# Patient Record
Sex: Female | Born: 1945 | Race: White | Hispanic: No | Smoking: Never smoker
Health system: Southern US, Community
[De-identification: ages and names within clinical notes are randomized; demographics above are authoritative.]

## PROBLEM LIST (undated history)

## (undated) DIAGNOSIS — I2510 Atherosclerotic heart disease of native coronary artery without angina pectoris: Secondary | ICD-10-CM

## (undated) DIAGNOSIS — I471 Supraventricular tachycardia, unspecified: Secondary | ICD-10-CM

## (undated) DIAGNOSIS — IMO0002 Reserved for concepts with insufficient information to code with codable children: Secondary | ICD-10-CM

## (undated) DIAGNOSIS — E119 Type 2 diabetes mellitus without complications: Secondary | ICD-10-CM

## (undated) DIAGNOSIS — R609 Edema, unspecified: Secondary | ICD-10-CM

## (undated) DIAGNOSIS — F419 Anxiety disorder, unspecified: Secondary | ICD-10-CM

## (undated) DIAGNOSIS — I219 Acute myocardial infarction, unspecified: Secondary | ICD-10-CM

## (undated) DIAGNOSIS — Z9289 Personal history of other medical treatment: Secondary | ICD-10-CM

## (undated) DIAGNOSIS — E7800 Pure hypercholesterolemia, unspecified: Secondary | ICD-10-CM

## (undated) DIAGNOSIS — K626 Ulcer of anus and rectum: Secondary | ICD-10-CM

## (undated) DIAGNOSIS — M1990 Unspecified osteoarthritis, unspecified site: Secondary | ICD-10-CM

## (undated) DIAGNOSIS — I10 Essential (primary) hypertension: Secondary | ICD-10-CM

## (undated) HISTORY — PX: BREAST SURGERY: SHX581

## (undated) HISTORY — PX: CHOLECYSTECTOMY: SHX55

## (undated) HISTORY — PX: FRACTURE SURGERY: SHX138

## (undated) HISTORY — DX: Supraventricular tachycardia: I47.1

## (undated) HISTORY — PX: TUBAL LIGATION: SHX77

## (undated) HISTORY — DX: Supraventricular tachycardia, unspecified: I47.10

## (undated) HISTORY — PX: EYE SURGERY: SHX253

## (undated) HISTORY — DX: Ulcer of anus and rectum: K62.6

## (undated) HISTORY — DX: Atherosclerotic heart disease of native coronary artery without angina pectoris: I25.10

## (undated) HISTORY — PX: CATARACT EXTRACTION, BILATERAL: SHX1313

---

## 2001-09-01 ENCOUNTER — Inpatient Hospital Stay (HOSPITAL_COMMUNITY): Admission: EM | Admit: 2001-09-01 | Discharge: 2001-09-04 | Payer: Self-pay

## 2001-10-29 ENCOUNTER — Encounter: Payer: Self-pay | Admitting: *Deleted

## 2001-10-29 ENCOUNTER — Encounter: Admission: RE | Admit: 2001-10-29 | Discharge: 2001-10-29 | Payer: Self-pay | Admitting: *Deleted

## 2001-11-24 ENCOUNTER — Emergency Department (HOSPITAL_COMMUNITY): Admission: EM | Admit: 2001-11-24 | Discharge: 2001-11-24 | Payer: Self-pay | Admitting: *Deleted

## 2001-11-29 ENCOUNTER — Encounter: Admission: RE | Admit: 2001-11-29 | Discharge: 2001-11-29 | Payer: Self-pay | Admitting: Family Medicine

## 2001-11-29 ENCOUNTER — Encounter: Payer: Self-pay | Admitting: Family Medicine

## 2002-10-28 ENCOUNTER — Encounter: Payer: Self-pay | Admitting: Ophthalmology

## 2002-10-29 ENCOUNTER — Ambulatory Visit (HOSPITAL_COMMUNITY): Admission: RE | Admit: 2002-10-29 | Discharge: 2002-10-30 | Payer: Self-pay | Admitting: Ophthalmology

## 2003-12-23 ENCOUNTER — Ambulatory Visit (HOSPITAL_COMMUNITY): Admission: RE | Admit: 2003-12-23 | Discharge: 2003-12-23 | Payer: Self-pay | Admitting: Ophthalmology

## 2006-10-05 ENCOUNTER — Encounter (HOSPITAL_BASED_OUTPATIENT_CLINIC_OR_DEPARTMENT_OTHER): Admission: RE | Admit: 2006-10-05 | Discharge: 2006-12-27 | Payer: Self-pay | Admitting: Surgery

## 2011-02-07 ENCOUNTER — Emergency Department (HOSPITAL_COMMUNITY): Payer: Medicare Other

## 2011-02-07 ENCOUNTER — Emergency Department (HOSPITAL_COMMUNITY)
Admission: EM | Admit: 2011-02-07 | Discharge: 2011-02-08 | Disposition: A | Discharge status: Home / Self Care | Payer: Medicare Other | Attending: Emergency Medicine | Admitting: Emergency Medicine

## 2011-02-07 DIAGNOSIS — R197 Diarrhea, unspecified: Secondary | ICD-10-CM | POA: Insufficient documentation

## 2011-02-07 DIAGNOSIS — K5289 Other specified noninfective gastroenteritis and colitis: Secondary | ICD-10-CM | POA: Insufficient documentation

## 2011-02-07 DIAGNOSIS — R079 Chest pain, unspecified: Secondary | ICD-10-CM | POA: Insufficient documentation

## 2011-02-07 DIAGNOSIS — R55 Syncope and collapse: Secondary | ICD-10-CM | POA: Insufficient documentation

## 2011-02-07 DIAGNOSIS — I10 Essential (primary) hypertension: Secondary | ICD-10-CM | POA: Insufficient documentation

## 2011-02-07 DIAGNOSIS — N390 Urinary tract infection, site not specified: Secondary | ICD-10-CM | POA: Insufficient documentation

## 2011-02-07 DIAGNOSIS — E119 Type 2 diabetes mellitus without complications: Secondary | ICD-10-CM | POA: Insufficient documentation

## 2011-02-07 DIAGNOSIS — Z794 Long term (current) use of insulin: Secondary | ICD-10-CM | POA: Insufficient documentation

## 2011-02-07 LAB — GLUCOSE, CAPILLARY: Glucose-Capillary: 313 mg/dL — ABNORMAL HIGH (ref 70–99)

## 2011-02-08 LAB — DIFFERENTIAL
Basophils Absolute: 0 10*3/uL (ref 0.0–0.1)
Basophils Relative: 0 % (ref 0–1)
Eosinophils Relative: 0 % (ref 0–5)
Monocytes Absolute: 0.8 10*3/uL (ref 0.1–1.0)
Neutro Abs: 12.3 10*3/uL — ABNORMAL HIGH (ref 1.7–7.7)

## 2011-02-08 LAB — POCT CARDIAC MARKERS
CKMB, poc: 2.1 ng/mL (ref 1.0–8.0)
Myoglobin, poc: 171 ng/mL (ref 12–200)
Troponin i, poc: <0.05 ng/mL (ref 0.00–0.09)

## 2011-02-08 LAB — CBC
HCT: 43.5 % (ref 36.0–46.0)
Hemoglobin: 14.7 g/dL (ref 12.0–15.0)
MCH: 30.5 pg (ref 26.0–34.0)
MCHC: 33.8 g/dL (ref 30.0–36.0)
MCV: 90.2 fL (ref 78.0–100.0)
Platelets: 151 10*3/uL (ref 150–400)
RBC: 4.82 MIL/uL (ref 3.87–5.11)
RDW: 12.5 % (ref 11.5–15.5)
WBC: 13.6 10*3/uL — ABNORMAL HIGH (ref 4.0–10.5)

## 2011-02-08 LAB — URINALYSIS, ROUTINE W REFLEX MICROSCOPIC
Nitrite: POSITIVE — AB
Specific Gravity, Urine: 1.023 (ref 1.005–1.030)
Urobilinogen, UA: 0.2 mg/dL (ref 0.0–1.0)
pH: 6 (ref 5.0–8.0)

## 2011-02-08 LAB — URINE MICROSCOPIC-ADD ON

## 2011-02-08 LAB — BASIC METABOLIC PANEL
Calcium: 8.3 mg/dL — ABNORMAL LOW (ref 8.4–10.5)
GFR calc Af Amer: >60 mL/min (ref >60)
GFR calc non Af Amer: >60 mL/min (ref >60)
Potassium: 3.8 mEq/L (ref 3.5–5.1)
Sodium: 135 mEq/L (ref 135–145)

## 2011-02-08 LAB — PROTIME-INR
INR: 1.06 (ref 0.00–1.49)
Prothrombin Time: 14 seconds (ref 11.6–15.2)

## 2011-02-08 LAB — APTT: aPTT: 39 seconds — ABNORMAL HIGH (ref 24–37)

## 2011-02-10 LAB — URINE CULTURE: Colony Count: >=100000

## 2011-03-09 ENCOUNTER — Ambulatory Visit (HOSPITAL_COMMUNITY)
Admission: EM | Admit: 2011-03-09 | Discharge: 2011-03-09 | Disposition: A | Discharge status: Home / Self Care | Payer: Self-pay | Source: Ambulatory Visit | Attending: Emergency Medicine | Admitting: Emergency Medicine

## 2012-03-31 ENCOUNTER — Inpatient Hospital Stay (HOSPITAL_COMMUNITY): Payer: Medicare Other

## 2012-03-31 ENCOUNTER — Encounter (HOSPITAL_COMMUNITY): Payer: Self-pay | Admitting: *Deleted

## 2012-03-31 ENCOUNTER — Inpatient Hospital Stay (HOSPITAL_COMMUNITY)
Admission: EM | Admit: 2012-03-31 | Discharge: 2012-04-04 | DRG: 603 | Disposition: A | Discharge status: Home / Self Care | Payer: Medicare Other | Attending: Internal Medicine | Admitting: Internal Medicine

## 2012-03-31 ENCOUNTER — Emergency Department (HOSPITAL_COMMUNITY): Payer: Medicare Other

## 2012-03-31 DIAGNOSIS — E860 Dehydration: Secondary | ICD-10-CM

## 2012-03-31 DIAGNOSIS — I10 Essential (primary) hypertension: Secondary | ICD-10-CM | POA: Diagnosis present

## 2012-03-31 DIAGNOSIS — L97509 Non-pressure chronic ulcer of other part of unspecified foot with unspecified severity: Secondary | ICD-10-CM | POA: Diagnosis present

## 2012-03-31 DIAGNOSIS — E871 Hypo-osmolality and hyponatremia: Secondary | ICD-10-CM

## 2012-03-31 DIAGNOSIS — L02619 Cutaneous abscess of unspecified foot: Secondary | ICD-10-CM

## 2012-03-31 DIAGNOSIS — L03039 Cellulitis of unspecified toe: Secondary | ICD-10-CM

## 2012-03-31 DIAGNOSIS — IMO0002 Reserved for concepts with insufficient information to code with codable children: Secondary | ICD-10-CM | POA: Diagnosis present

## 2012-03-31 DIAGNOSIS — E119 Type 2 diabetes mellitus without complications: Secondary | ICD-10-CM

## 2012-03-31 DIAGNOSIS — E1165 Type 2 diabetes mellitus with hyperglycemia: Secondary | ICD-10-CM | POA: Diagnosis present

## 2012-03-31 DIAGNOSIS — L03119 Cellulitis of unspecified part of limb: Principal | ICD-10-CM | POA: Diagnosis present

## 2012-03-31 DIAGNOSIS — E785 Hyperlipidemia, unspecified: Secondary | ICD-10-CM | POA: Diagnosis present

## 2012-03-31 LAB — DIFFERENTIAL
Basophils Absolute: 0 10*3/uL (ref 0.0–0.1)
Basophils Relative: 0 % (ref 0–1)
Eosinophils Absolute: 0 10*3/uL (ref 0.0–0.7)
Eosinophils Relative: 0 % (ref 0–5)
Lymphocytes Relative: 10 % — ABNORMAL LOW (ref 12–46)
Lymphs Abs: 1.6 10*3/uL (ref 0.7–4.0)
Monocytes Absolute: 1.5 10*3/uL — ABNORMAL HIGH (ref 0.1–1.0)
Monocytes Relative: 10 % (ref 3–12)
Neutro Abs: 12.4 10*3/uL — ABNORMAL HIGH (ref 1.7–7.7)
Neutrophils Relative %: 80 % — ABNORMAL HIGH (ref 43–77)

## 2012-03-31 LAB — COMPREHENSIVE METABOLIC PANEL
ALT: 12 U/L (ref 0–35)
AST: 12 U/L (ref 0–37)
Albumin: 2.7 g/dL — ABNORMAL LOW (ref 3.5–5.2)
Alkaline Phosphatase: 72 U/L (ref 39–117)
BUN: 17 mg/dL (ref 6–23)
CO2: 22 mEq/L (ref 19–32)
Calcium: 9 mg/dL (ref 8.4–10.5)
Chloride: 89 mEq/L — ABNORMAL LOW (ref 96–112)
Creatinine, Ser: 0.62 mg/dL (ref 0.50–1.10)
GFR calc Af Amer: >90 mL/min (ref >90)
GFR calc non Af Amer: >90 mL/min (ref >90)
Glucose, Bld: 367 mg/dL — ABNORMAL HIGH (ref 70–99)
Potassium: 3.6 mEq/L (ref 3.5–5.1)
Sodium: 129 mEq/L — ABNORMAL LOW (ref 135–145)
Total Bilirubin: 0.4 mg/dL (ref 0.3–1.2)
Total Protein: 7.2 g/dL (ref 6.0–8.3)

## 2012-03-31 LAB — GLUCOSE, CAPILLARY: Glucose-Capillary: 362 mg/dL — ABNORMAL HIGH (ref 70–99)

## 2012-03-31 LAB — CBC
HCT: 37.2 % (ref 36.0–46.0)
Hemoglobin: 13.1 g/dL (ref 12.0–15.0)
MCH: 30.5 pg (ref 26.0–34.0)
MCHC: 35.2 g/dL (ref 30.0–36.0)
MCV: 86.7 fL (ref 78.0–100.0)
Platelets: 197 10*3/uL (ref 150–400)
RBC: 4.29 MIL/uL (ref 3.87–5.11)
RDW: 12.4 % (ref 11.5–15.5)
WBC: 15.6 10*3/uL — ABNORMAL HIGH (ref 4.0–10.5)

## 2012-03-31 LAB — LACTIC ACID, PLASMA: Lactic Acid, Venous: 1.6 mmol/L (ref 0.5–2.2)

## 2012-03-31 LAB — SEDIMENTATION RATE: Sed Rate: 110 mm/hr — ABNORMAL HIGH (ref 0–22)

## 2012-03-31 LAB — C-REACTIVE PROTEIN: CRP: 32.23 mg/dL — ABNORMAL HIGH (ref <0.60)

## 2012-03-31 MED ORDER — PIPERACILLIN-TAZOBACTAM 3.375 G IVPB
3.3750 g | Freq: Three times a day (TID) | INTRAVENOUS | Status: DC
  Administered 2012-03-31 – 2012-04-04 (×11): 3.375 g via INTRAVENOUS
  Filled 2012-03-31 (×14): qty 50

## 2012-03-31 MED ORDER — CLINDAMYCIN PHOSPHATE 900 MG/50ML IV SOLN
900.0000 mg | INTRAVENOUS | Status: AC
  Administered 2012-03-31: 900 mg via INTRAVENOUS
  Filled 2012-03-31: qty 50

## 2012-03-31 MED ORDER — ACETAMINOPHEN 325 MG PO TABS
650.0000 mg | ORAL_TABLET | Freq: Four times a day (QID) | ORAL | Status: DC | PRN
  Administered 2012-04-03: 650 mg via ORAL
  Filled 2012-03-31: qty 2

## 2012-03-31 MED ORDER — TETANUS-DIPHTH-ACELL PERTUSSIS 5-2.5-18.5 LF-MCG/0.5 IM SUSP
0.5000 mL | Freq: Once | INTRAMUSCULAR | Status: AC
  Administered 2012-03-31: 0.5 mL via INTRAMUSCULAR
  Filled 2012-03-31: qty 0.5

## 2012-03-31 MED ORDER — FUROSEMIDE 40 MG PO TABS
40.0000 mg | ORAL_TABLET | Freq: Every day | ORAL | Status: DC
  Administered 2012-03-31 – 2012-04-04 (×5): 40 mg via ORAL
  Filled 2012-03-31 (×5): qty 1

## 2012-03-31 MED ORDER — INSULIN GLARGINE 100 UNIT/ML ~~LOC~~ SOLN
20.0000 [IU] | Freq: Once | SUBCUTANEOUS | Status: AC
  Administered 2012-03-31: 20 [IU] via SUBCUTANEOUS

## 2012-03-31 MED ORDER — INSULIN ASPART 100 UNIT/ML ~~LOC~~ SOLN
0.0000 [IU] | Freq: Three times a day (TID) | SUBCUTANEOUS | Status: DC
  Administered 2012-04-01: 7 [IU] via SUBCUTANEOUS
  Administered 2012-04-01 – 2012-04-02 (×3): 5 [IU] via SUBCUTANEOUS
  Administered 2012-04-02 – 2012-04-03 (×3): 3 [IU] via SUBCUTANEOUS
  Administered 2012-04-03: 2 [IU] via SUBCUTANEOUS

## 2012-03-31 MED ORDER — ENOXAPARIN SODIUM 40 MG/0.4ML ~~LOC~~ SOLN
40.0000 mg | SUBCUTANEOUS | Status: DC
  Administered 2012-03-31 – 2012-04-04 (×4): 40 mg via SUBCUTANEOUS
  Filled 2012-03-31 (×5): qty 0.4

## 2012-03-31 MED ORDER — INSULIN GLARGINE 100 UNIT/ML ~~LOC~~ SOLN
SUBCUTANEOUS | Status: AC
  Administered 2012-03-31: 20 [IU] via SUBCUTANEOUS
  Filled 2012-03-31: qty 20

## 2012-03-31 MED ORDER — SODIUM CHLORIDE 0.9 % IV SOLN
INTRAVENOUS | Status: DC
  Administered 2012-03-31 – 2012-04-03 (×6): via INTRAVENOUS

## 2012-03-31 MED ORDER — ONDANSETRON HCL 4 MG PO TABS: 4.0000 mg | ORAL_TABLET | Freq: Four times a day (QID) | ORAL | Status: DC | PRN

## 2012-03-31 MED ORDER — VITAMIN C 500 MG PO TABS
500.0000 mg | ORAL_TABLET | Freq: Every day | ORAL | Status: DC
  Administered 2012-04-01 – 2012-04-04 (×4): 500 mg via ORAL
  Filled 2012-03-31 (×4): qty 1

## 2012-03-31 MED ORDER — ACETAMINOPHEN 650 MG RE SUPP: 650.0000 mg | Freq: Four times a day (QID) | RECTAL | Status: DC | PRN

## 2012-03-31 MED ORDER — SODIUM CHLORIDE 0.9 % IV BOLUS (SEPSIS)
500.0000 mL | Freq: Once | INTRAVENOUS | Status: AC
  Administered 2012-03-31: 500 mL via INTRAVENOUS

## 2012-03-31 MED ORDER — MORPHINE SULFATE 2 MG/ML IJ SOLN: 2.0000 mg | INTRAMUSCULAR | Status: DC | PRN

## 2012-03-31 MED ORDER — INSULIN ASPART 100 UNIT/ML ~~LOC~~ SOLN
20.0000 [IU] | Freq: Once | SUBCUTANEOUS | Status: AC
  Administered 2012-03-31: 20 [IU] via SUBCUTANEOUS

## 2012-03-31 MED ORDER — ONDANSETRON HCL 4 MG/2ML IJ SOLN: 4.0000 mg | Freq: Four times a day (QID) | INTRAMUSCULAR | Status: DC | PRN

## 2012-03-31 MED ORDER — INSULIN GLARGINE 100 UNIT/ML ~~LOC~~ SOLN
34.0000 [IU] | Freq: Two times a day (BID) | SUBCUTANEOUS | Status: DC
  Administered 2012-04-01 – 2012-04-02 (×3): 34 [IU] via SUBCUTANEOUS

## 2012-03-31 MED ORDER — INSULIN ASPART 100 UNIT/ML ~~LOC~~ SOLN
SUBCUTANEOUS | Status: AC
  Administered 2012-03-31: 20 [IU] via SUBCUTANEOUS
  Filled 2012-03-31: qty 1

## 2012-03-31 MED ORDER — RAMIPRIL 10 MG PO CAPS
10.0000 mg | ORAL_CAPSULE | Freq: Two times a day (BID) | ORAL | Status: DC
  Administered 2012-03-31 – 2012-04-04 (×8): 10 mg via ORAL
  Filled 2012-03-31 (×9): qty 1

## 2012-03-31 MED ORDER — VANCOMYCIN HCL IN DEXTROSE 1-5 GM/200ML-% IV SOLN
1000.0000 mg | Freq: Two times a day (BID) | INTRAVENOUS | Status: DC
  Administered 2012-03-31 – 2012-04-04 (×8): 1000 mg via INTRAVENOUS
  Filled 2012-03-31 (×9): qty 200

## 2012-03-31 MED ORDER — SIMVASTATIN 40 MG PO TABS
40.0000 mg | ORAL_TABLET | Freq: Every day | ORAL | Status: DC
  Administered 2012-03-31 – 2012-04-03 (×4): 40 mg via ORAL
  Filled 2012-03-31 (×5): qty 1

## 2012-03-31 MED ORDER — ADULT MULTIVITAMIN W/MINERALS CH
1.0000 | ORAL_TABLET | Freq: Every day | ORAL | Status: DC
  Administered 2012-04-01 – 2012-04-04 (×4): 1 via ORAL
  Filled 2012-03-31 (×4): qty 1

## 2012-03-31 NOTE — ED Notes (Signed)
Patient denies pain and is resting comfortably.  

## 2012-03-31 NOTE — ED Notes (Signed)
To ED for eval of right foot pain and redness for the past week. Unknown injury. Pt is diabetic. 3-4 plus pitting edema and foot is red

## 2012-03-31 NOTE — ED Notes (Signed)
Admitting MD at bedside, pt awaiting inpt beds assignment.  

## 2012-03-31 NOTE — ED Provider Notes (Signed)
History     CSN: [id number]  Arrival date & time 03/31/12  1427   First MD Initiated Contact with Patient 03/31/12 1506      Chief Complaint  Patient presents with  . Foot Swelling    (Consider location/radiation/quality/duration/timing/severity/associated sxs/prior treatment) Patient is a 66 y.o. female presenting with rash. The history is provided by the patient.  Rash  This is a new problem. The current episode started 2 days ago. The problem has been gradually worsening. The problem is associated with an unknown (No known injury, but does walk barefoot frequently) factor. There has been no fever. The rash is present on the right foot. The pain is moderate. The pain has been constant since onset. Associated symptoms include blisters and pain. She has tried nothing for the symptoms. Risk factors: DM.    Past Medical History  Diagnosis Date  . Diabetes mellitus     History reviewed. No pertinent past surgical history.  History reviewed. No pertinent family history.  History  Substance Use Topics  . Smoking status: Not on file  . Smokeless tobacco: Not on file  . Alcohol Use: No    OB History    Grav Para Term Preterm Abortions TAB SAB Ect Mult Living                  Review of Systems  Constitutional: Negative for fever, chills and fatigue.  Respiratory: Negative for cough and shortness of breath.   Cardiovascular: Negative for chest pain.  Gastrointestinal: Positive for nausea. Negative for vomiting, abdominal pain and diarrhea.  Genitourinary: Negative for dysuria.  Skin: Positive for rash.  Neurological: Negative for headaches.  All other systems reviewed and are negative.    Allergies  Review of patient's allergies indicates no known allergies.  Home Medications   Current Outpatient Rx  Name Route Sig Dispense Refill  . VITAMIN C PO Oral Take 1 tablet by mouth daily.    . FUROSEMIDE 40 MG PO TABS Oral Take 40 mg by mouth daily.    . INSULIN GLARGINE  100 UNIT/ML [place] SOLN Subcutaneous Inject 34 Units into the skin 2 (two) times daily.    . ADULT MULTIVITAMIN W/MINERALS CH Oral Take 1 tablet by mouth daily.    Marland Kitchen SALMON PO Oral Take 1 capsule by mouth daily.    Marland Kitchen RAMIPRIL 10 MG PO CAPS Oral Take 10 mg by mouth 2 (two) times daily.    Marland Kitchen SIMVASTATIN 40 MG PO TABS Oral Take 40 mg by mouth at bedtime.      BP 144/77  Pulse 110  Temp(Src) 99.2 F (37.3 C) (Oral)  Resp 22  SpO2 95%  Physical Exam  Nursing note and vitals reviewed. Constitutional: She is oriented to person, place, and time. She appears well-developed and well-nourished.  HENT:  Head: Normocephalic and atraumatic.  Eyes: EOM are normal.  Neck: Normal range of motion.  Cardiovascular: Normal rate, regular rhythm and normal heart sounds.   Pulmonary/Chest: Effort normal and breath sounds normal. No respiratory distress.  Abdominal: Soft. There is no tenderness.  Musculoskeletal: Normal range of motion.       Right foot: She exhibits tenderness and swelling.  Neurological: She is alert and oriented to person, place, and time.  Skin: Skin is warm and dry. There is erythema (R foot).  Psychiatric: She has a normal mood and affect.    ED Course  Procedures (including critical care time)  Labs Reviewed  CBC - Abnormal; Notable for the  following:    WBC 15.6 (*)    All other components within normal limits  DIFFERENTIAL - Abnormal; Notable for the following:    Neutrophils Relative 80 (*)    Neutro Abs 12.4 (*)    Lymphocytes Relative 10 (*)    Monocytes Absolute 1.5 (*)    All other components within normal limits  COMPREHENSIVE METABOLIC PANEL - Abnormal; Notable for the following:    Sodium 129 (*)    Chloride 89 (*)    Glucose, Bld 367 (*)    Albumin 2.7 (*)    All other components within normal limits  SEDIMENTATION RATE - Abnormal; Notable for the following:    Sed Rate 110 (*)    All other components within normal limits  GLUCOSE, CAPILLARY - Abnormal;  Notable for the following:    Glucose-Capillary 362 (*)    All other components within normal limits  LACTIC ACID, PLASMA  C-REACTIVE PROTEIN  CULTURE, BLOOD (ROUTINE X 2)  CULTURE, BLOOD (ROUTINE X 2)   Dg Foot Complete Right  03/31/2012  *RADIOLOGY REPORT*  Clinical Data: Stepped on something earlier this week, now with swelling, pain, and rash.  History of diabetes.  RIGHT FOOT COMPLETE - 3+ VIEW 03/31/2012:  Comparison: None.  Findings: Soft tissue swelling along the plantar surface of the foot overlying the head of the metatarsals.  No opaque foreign body in the soft tissues.  Moderate hallux valgus.  Osteopenia.  No evidence of acute or subacute fracture or dislocation.  Joint space narrowing involving the IP joints of multiple toes.  Remaining joint spaces well preserved.  Small plantar calcaneal spur.  IMPRESSION: No opaque foreign body in the soft tissues.  No acute osseous abnormality.  Osteopenia.  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  Original Report Authenticated By: Arnell Sieving, M.D.     1. Cellulitis of foot   2. Hyponatremia       MDM   Pt with h/o DM presents with R foot pain, swelling, redness.  Exam with same. Warmth throughout foot and findings c/w cellulitis. On sole of foot there is smaller area of hemorrhagic necrosis. No crepitus appreciated. Also area of skin thickening/whitening.  No area of fluctuance indicating focal fluid collection.   Labs witih noted leukocytosis and elevated ESR. Also low Na and Cl, most c/w dehydration. XR without SQ gas or osseous changes.  Hospitalist will admit.      Donnamarie Poag, MD 03/31/12 2214

## 2012-03-31 NOTE — ED Notes (Signed)
Spoke with admitting MD Garba with reports of pt blood sugar level due to orders to call MD for blood sugar level greater than 400. MD Mikeal Hawthorne given verbal order to give pt 20 units of Lantus and 20 units of Novolog subcutaneous.

## 2012-03-31 NOTE — H&P (Signed)
[name] is an 66 y.o. female.   Chief Complaint: Right leg swelling HPI: A 66 year old female with history of diabetes hyperlipidemia who has been doing fine until about a week ago when she noticed a small swelling on the bottom of her foot. These gradually progressed to swelling of whole right leg especially the foot. It has changed color to be red, persistent leg swelling mildly tender. Patient has long-standing diabetes and has some level of neuropathy. She uses diabetic shoes but apparently she's been walking barefooted around the house. She did not remember any injury. She had no prior history of cellulitis. She denied fever or chills but is noted to have low-grade fever in the ER. She has so far her blood cultures and initial antibiotics in the ER. Her sugar has been relatively controlled with insulin but it is noted that she had a blood sugar of about 400 now. Patient didn't see some discharge from the bottom of her foot but that has stopped. Her pain right now is at 2/10.  Past Medical History  Diagnosis Date  . Diabetes mellitus     History reviewed. No pertinent past surgical history.  History reviewed. No pertinent family history. Social History:  does not have a smoking history on file. She does not have any smokeless tobacco history on file. She reports that she does not drink alcohol or use illicit drugs.  Allergies: No Known Allergies   (Not in a hospital admission)  Results for orders placed during the hospital encounter of 03/31/12 (from the past 48 hour(s))  CBC     Status: Abnormal   Collection Time   03/31/12  3:37 PM      Component Value Range Comment   WBC 15.6 (*) 4.0 - 10.5 (K/uL)    RBC 4.29  3.87 - 5.11 (MIL/uL)    Hemoglobin 13.1  12.0 - 15.0 (g/dL)    HCT 40.9  81.1 - 91.4 (%)    MCV 86.7  78.0 - 100.0 (fL)    MCH 30.5  26.0 - 34.0 (pg)    MCHC 35.2  30.0 - 36.0 (g/dL)    RDW 78.2  95.6 - 21.3 (%)    Platelets 197  150 - 400 (K/uL)   DIFFERENTIAL      Status: Abnormal   Collection Time   03/31/12  3:37 PM      Component Value Range Comment   Neutrophils Relative 80 (*) 43 - 77 (%)    Neutro Abs 12.4 (*) 1.7 - 7.7 (K/uL)    Lymphocytes Relative 10 (*) 12 - 46 (%)    Lymphs Abs 1.6  0.7 - 4.0 (K/uL)    Monocytes Relative 10  3 - 12 (%)    Monocytes Absolute 1.5 (*) 0.1 - 1.0 (K/uL)    Eosinophils Relative 0  0 - 5 (%)    Eosinophils Absolute 0.0  0.0 - 0.7 (K/uL)    Basophils Relative 0  0 - 1 (%)    Basophils Absolute 0.0  0.0 - 0.1 (K/uL)   COMPREHENSIVE METABOLIC PANEL     Status: Abnormal   Collection Time   03/31/12  3:37 PM      Component Value Range Comment   Sodium 129 (*) 135 - 145 (mEq/L)    Potassium 3.6  3.5 - 5.1 (mEq/L)    Chloride 89 (*) 96 - 112 (mEq/L)    CO2 22  19 - 32 (mEq/L)    Glucose, Bld 367 (*) 70 -  99 (mg/dL)    BUN 17  6 - 23 (mg/dL)    Creatinine, Ser 1.61  0.50 - 1.10 (mg/dL)    Calcium 9.0  8.4 - 10.5 (mg/dL)    Total Protein 7.2  6.0 - 8.3 (g/dL)    Albumin 2.7 (*) 3.5 - 5.2 (g/dL)    AST 12  0 - 37 (U/L)    ALT 12  0 - 35 (U/L)    Alkaline Phosphatase 72  39 - 117 (U/L)    Total Bilirubin 0.4  0.3 - 1.2 (mg/dL)    GFR calc non Af Amer >90  >90 (mL/min)    GFR calc Af Amer >90  >90 (mL/min)   LACTIC ACID, PLASMA     Status: Normal   Collection Time   03/31/12  3:37 PM      Component Value Range Comment   Lactic Acid, Venous 1.6  0.5 - 2.2 (mmol/L)   SEDIMENTATION RATE     Status: Abnormal   Collection Time   03/31/12  3:37 PM      Component Value Range Comment   Sed Rate 110 (*) 0 - 22 (mm/hr)   C-REACTIVE PROTEIN     Status: Abnormal   Collection Time   03/31/12  3:37 PM      Component Value Range Comment   CRP 32.23 (*) <0.60 (mg/dL)   GLUCOSE, CAPILLARY     Status: Abnormal   Collection Time   03/31/12  4:06 PM      Component Value Range Comment   Glucose-Capillary 362 (*) 70 - 99 (mg/dL)   GLUCOSE, CAPILLARY     Status: Abnormal   Collection Time   03/31/12  7:39 PM      Component Value  Range Comment   Glucose-Capillary 444 (*) 70 - 99 (mg/dL)    Dg Foot Complete Right  03/31/2012  *RADIOLOGY REPORT*  Clinical Data: Stepped on something earlier this week, now with swelling, pain, and rash.  History of diabetes.  RIGHT FOOT COMPLETE - 3+ VIEW 03/31/2012:  Comparison: None.  Findings: Soft tissue swelling along the plantar surface of the foot overlying the head of the metatarsals.  No opaque foreign body in the soft tissues.  Moderate hallux valgus.  Osteopenia.  No evidence of acute or subacute fracture or dislocation.  Joint space narrowing involving the IP joints of multiple toes.  Remaining joint spaces well preserved.  Small plantar calcaneal spur.  IMPRESSION: No opaque foreign body in the soft tissues.  No acute osseous abnormality.  Osteopenia.  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  Original Report Authenticated By: Arnell Sieving, M.D.    Review of Systems  Constitutional: Positive for fever. Negative for chills.  HENT: Negative.   Eyes: Negative.   Respiratory: Negative.   Cardiovascular: Negative.   Gastrointestinal: Negative.   Musculoskeletal: Negative.   Skin: Positive for rash.  Neurological: Negative.   Psychiatric/Behavioral: Negative.     Blood pressure 145/89, pulse 118, temperature 99 F (37.2 C), temperature source Oral, resp. rate 20, SpO2 96.00%. Physical Exam  Constitutional: She is oriented to person, place, and time. She appears well-developed and well-nourished.  HENT:  Head: Normocephalic and atraumatic.  Right Ear: External ear normal.  Left Ear: External ear normal.  Nose: Nose normal.  Mouth/Throat: Oropharynx is clear and moist.  Eyes: Conjunctivae and EOM are normal. Pupils are equal, round, and reactive to light.  Neck: Normal range of motion. Neck supple.  Cardiovascular: Normal rate,  regular rhythm, normal heart sounds and intact distal pulses.   Respiratory: Effort normal and breath sounds normal.  GI: Soft.  Bowel sounds are normal.  Musculoskeletal: She exhibits edema.       Feet:  Neurological: She is alert and oriented to person, place, and time. She has normal reflexes.  Skin: Skin is warm. There is erythema.  Psychiatric: She has a normal mood and affect. Her behavior is normal. Judgment and thought content normal.     Assessment/Plan Assessment this is a 66 year old female presenting with diabetic foot ulcer with  Cellulitis. There is a possibility of abscess also. Most likely she had an injury from walking around barefooted weeks caused the infection. Plan #1 cellulitis with ulcer: Patient will be admitted, started on IV antibiotics, I will check a CT of the foot to rule out any abscess,  Pursue blood cultures, obtain wound cultures, get wound care and consider Orthopedics consult. Plan #2 diabetes: This seems uncontrolled. We'll give her some sliding scale insulin with her home Lantus dose and titrate as . Will check hemoglobin A1c to see how well controlled her sugar has been. #3 hyponatremia: Probably from hypoglycemia. We'll give high-dose saline and follow her sodium level. #4 dehydration: Again will hydrate her with saline #5 hyperlipidemia: Continue with statins #6 hypertension: Continue her home medications and titrate as necessary.  Mysti Haley,LAWAL 03/31/2012, 8:21 PM

## 2012-03-31 NOTE — Consult Note (Signed)
ANTIBIOTIC CONSULT NOTE - INITIAL  Pharmacy Consult for Vancomycin + Zosyn Indication: right foot cellulitis w/ ulcer  No Known Allergies  Patient Measurements: Height: 5' 8.5" (174 cm) Weight: 198 lb (89.812 kg) IBW/kg (Calculated) : 65.05   Vital Signs: Temp: 98.4 F (36.9 C) (05/04 2037) Temp src: Oral (05/04 2037) BP: 149/79 mmHg (05/04 2037) Pulse Rate: 98  (05/04 2037)  Labs:  Basename 03/31/12 1537  WBC 15.6*  HGB 13.1  PLT 197  LABCREA --  CREATININE 0.62   Estimated Creatinine Clearance: 80.8 ml/min (by C-G formula based on Cr of 0.62).  Microbiology: No results found for this or any previous visit (from the past 720 hour(s)).  Medical History: Past Medical History  Diagnosis Date  . Diabetes mellitus    Assessment: 65yof to begin empiric antibiotics for right diabetic foot ulcer with cellulitis. Weight=90kg and CrCl 34ml/min. Received one dose of clindamycin in the ED.  Goal of Therapy:  Vancomycin trough 10-15  Plan:  1) Vancomycin 1g q12 2) Zosyn 3.375g q8 - 4h infusion 3) Follow renal function, cultures, clinical progress  Fredrik Rigger 03/31/2012,8:41 PM

## 2012-03-31 NOTE — ED Notes (Signed)
X-ray to come over and pick up pt.

## 2012-04-01 DIAGNOSIS — E119 Type 2 diabetes mellitus without complications: Secondary | ICD-10-CM

## 2012-04-01 DIAGNOSIS — E871 Hypo-osmolality and hyponatremia: Secondary | ICD-10-CM

## 2012-04-01 DIAGNOSIS — I10 Essential (primary) hypertension: Secondary | ICD-10-CM

## 2012-04-01 DIAGNOSIS — L02619 Cutaneous abscess of unspecified foot: Secondary | ICD-10-CM

## 2012-04-01 DIAGNOSIS — L03039 Cellulitis of unspecified toe: Secondary | ICD-10-CM

## 2012-04-01 LAB — BASIC METABOLIC PANEL
CO2: 21 mEq/L (ref 19–32)
Calcium: 8.9 mg/dL (ref 8.4–10.5)
Chloride: 92 mEq/L — ABNORMAL LOW (ref 96–112)
Glucose, Bld: 240 mg/dL — ABNORMAL HIGH (ref 70–99)
Sodium: 128 mEq/L — ABNORMAL LOW (ref 135–145)

## 2012-04-01 LAB — CBC
HCT: 35.1 % — ABNORMAL LOW (ref 36.0–46.0)
MCH: 30.3 pg (ref 26.0–34.0)
MCV: 85.8 fL (ref 78.0–100.0)
Platelets: 223 10*3/uL (ref 150–400)
RBC: 4.09 MIL/uL (ref 3.87–5.11)

## 2012-04-01 LAB — GLUCOSE, CAPILLARY
Glucose-Capillary: 275 mg/dL — ABNORMAL HIGH (ref 70–99)
Glucose-Capillary: 299 mg/dL — ABNORMAL HIGH (ref 70–99)
Glucose-Capillary: 295 mg/dL — ABNORMAL HIGH (ref 70–99)

## 2012-04-01 MED ORDER — TRAZODONE 25 MG HALF TABLET
25.0000 mg | ORAL_TABLET | Freq: Every evening | ORAL | Status: DC | PRN
  Administered 2012-04-01 – 2012-04-02 (×2): 25 mg via ORAL
  Filled 2012-04-01 (×2): qty 1

## 2012-04-01 MED ORDER — IOHEXOL 350 MG/ML SOLN
80.0000 mL | Freq: Once | INTRAVENOUS | Status: AC | PRN
  Administered 2012-04-01: 80 mL via INTRAVENOUS

## 2012-04-01 NOTE — Progress Notes (Signed)
DAILY PROGRESS NOTE                              [facility]  SUBJECTIVE: Patient denies fever, denies chills. No complaints  OBJECTIVE: BP 132/75  Pulse 88  Temp(Src) 98.1 F (36.7 C) (Oral)  Resp 17  Ht 5' 9.6" (1.768 m)  Wt 85.8 kg (189 lb 2.5 oz)  BMI 27.45 kg/m2  SpO2 96%  Intake/Output Summary (Last 24 hours) at 04/01/12 1054 Last data filed at 04/01/12 1000  Gross per 24 hour  Intake 1456.25 ml  Output   1876 ml  Net -419.75 ml                      Weight change:  Physical Exam: General: Alert and awake oriented x3 not in any acute distress. HEENT: anicteric sclera, pupils equal reactive to light and accommodation CVS: S1-S2 heard, no murmur rubs or gallops Chest: clear to auscultation bilaterally, no wheezing rales or rhonchi Abdomen:  normal bowel sounds, soft, nontender, nondistended, no organomegaly Neuro: Cranial nerves II-XII intact, no focal neurological deficits Extremities: no cyanosis, no clubbing or edema noted bilaterally   Lab Results:  Basename 04/01/12 0035 03/31/12 1537  NA 128* 129*  K 3.1* 3.6  CL 92* 89*  CO2 21 22  GLUCOSE 240* 367*  BUN 16 17  CREATININE 0.58 0.62  CALCIUM 8.9 9.0  MG -- --  PHOS -- --    Basename 03/31/12 1537  AST 12  ALT 12  ALKPHOS 72  BILITOT 0.4  PROT 7.2  ALBUMIN 2.7*   No results found for this basename: LIPASE:2,AMYLASE:2 in the last 72 hours  Basename 04/01/12 0035 03/31/12 1537  WBC 14.1* 15.6*  NEUTROABS -- 12.4*  HGB 12.4 13.1  HCT 35.1* 37.2  MCV 85.8 86.7  PLT 223 197   No results found for this basename: CKTOTAL:3,CKMB:3,CKMBINDEX:3,TROPONINI:3 in the last 72 hours No components found with this basename: POCBNP:3 No results found for this basename: DDIMER:2 in the last 72 hours  Basename 03/31/12 1933  HGBA1C 13.5*   No results found for this basename: CHOL:2,HDL:2,LDLCALC:2,TRIG:2,CHOLHDL:2,LDLDIRECT:2 in the last 72 hours No results found for this  basename: TSH,T4TOTAL,FREET3,T3FREE,THYROIDAB in the last 72 hours No results found for this basename: VITAMINB12:2,FOLATE:2,FERRITIN:2,TIBC:2,IRON:2,RETICCTPCT:2 in the last 72 hours  Micro Results: No results found for this or any previous visit (from the past 240 hour(s)).  Studies/Results: Ct Foot Right W Contrast  04/01/2012  *RADIOLOGY REPORT*  Clinical Data: Diffuse right foot swelling and erythema, with a small sore at the plantar forefoot, near the big toe.  Assess for abscess.  History of diabetes.  CT OF THE RIGHT FOOT WITH CONTRAST  Technique:  Multidetector CT imaging was performed following the standard protocol during bolus administration of intravenous contrast.  Contrast: 80mL OMNIPAQUE IOHEXOL 350 MG/ML SOLN  Comparison: Right foot radiographs performed earlier today at 06:38 p.m.  Findings: Along the plantar aspect of the forefoot overlying the great toe, there is focal soft tissue irregularity and inflammation, without evidence of a focal fluid collection to suggest abscess.  Additional soft tissue edema is seen tracking about the forefoot, and along the dorsum of the midfoot; mild soft tissue irregularity along the dorsum of the midfoot appears chronic in nature.  There is mild soft tissue edema tracking about the lateral aspect of the ankle.  No definite osseous erosions are seen to suggest osteomyelitis. However,  evaluation for osteomyelitis is limited on CT.  Visualized joint spaces are preserved.  No significant degenerative change is appreciated.  A few tiny osseous fragments adjacent to the navicular are likely degenerative in nature.  The ankle mortise appears intact.  The interosseous space is within normal limits.  The subtalar joint is unremarkable in appearance. No significant soft tissue edema is seen along the sinus tarsi.  The flexor and extensor tendons are grossly unremarkable in appearance, aside from overlying soft tissue edema.  The peroneus tendons are unremarkable.   The Achilles tendon remains intact.  No acute vascular abnormalities are identified.  IMPRESSION:  1.  Focal soft tissue irregularity and inflammation along the plantar aspect of the forefoot, overlying the great toe. Additional soft tissue edema tracks about the forefoot, the dorsum of the midfoot, and lateral aspect of the ankle.  No evidence for abscess. 2.  No definite osseous erosions seen to suggest osteomyelitis, though evaluation for osteomyelitis is limited on CT.  No acute osseous abnormalities identified.  Original Report Authenticated By: Tonia Ghent, M.D.   Dg Foot Complete Right  03/31/2012  *RADIOLOGY REPORT*  Clinical Data: Stepped on something earlier this week, now with swelling, pain, and rash.  History of diabetes.  RIGHT FOOT COMPLETE - 3+ VIEW 03/31/2012:  Comparison: None.  Findings: Soft tissue swelling along the plantar surface of the foot overlying the head of the metatarsals.  No opaque foreign body in the soft tissues.  Moderate hallux valgus.  Osteopenia.  No evidence of acute or subacute fracture or dislocation.  Joint space narrowing involving the IP joints of multiple toes.  Remaining joint spaces well preserved.  Small plantar calcaneal spur.  IMPRESSION: No opaque foreign body in the soft tissues.  No acute osseous abnormality.  Osteopenia.  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  Original Report Authenticated By: Arnell Sieving, M.D.   Medications: Scheduled Meds:   . clindamycin (CLEOCIN) IV  900 mg Intravenous To Minor  . enoxaparin  40 mg Subcutaneous Q24H  . furosemide  40 mg Oral Daily  . insulin aspart  0-9 Units Subcutaneous TID WC  . insulin aspart  20 Units Subcutaneous Once  . insulin glargine  20 Units Subcutaneous Once  . insulin glargine  34 Units Subcutaneous BID  . mulitivitamin with minerals  1 tablet Oral Daily  . piperacillin-tazobactam (ZOSYN)  IV  3.375 g Intravenous Q8H  . ramipril  10 mg Oral BID  . simvastatin  40 mg  Oral QHS  . sodium chloride  500 mL Intravenous Once  . TDaP  0.5 mL Intramuscular Once  . vancomycin  1,000 mg Intravenous Q12H  . vitamin C  500 mg Oral Daily   Continuous Infusions:   . sodium chloride 125 mL/hr at 04/01/12 0503   PRN Meds:.acetaminophen, acetaminophen, iohexol, morphine injection, ondansetron (ZOFRAN) IV, ondansetron  ASSESSMENT & PLAN: Principal Problem:  *Cellulitis and abscess of foot Active Problems:  Dehydration  Hyponatremia  Diabetes mellitus   Right foot cellulitis -Diabetic foot ulcer, we'll obtain wound ostomy care team to see her. -CT scan of right foot showed no bony involvement, no evidence of abscess. -Patient started on broad-spectrum antibiotics including vancomycin and Zosyn.  Diabetes mellitus type 2 -Uncontrolled, hemoglobin A1c is 13.5 which correlate to blood glucose of 340. -Patient is on Lantus insulin, the dose will be adjusted while patient in the hospital here.  Hyponatremia -Corrected sodium level is about 131, I think dehydration also as playing a  role. -Patient is on normal saline we'll continue and follow the sodium level closely.  Dehydration -Hydrate aggressively with IV fluids, patient is on normal saline. -Continued to follow the renal function closely with BMP in the morning.  Hypertension -Continue preadmission home medications.   LOS: 1 day   Harshika Mago A 04/01/2012, 10:54 AM

## 2012-04-01 NOTE — Plan of Care (Signed)
Problem: Phase I Progression Outcomes Goal: OOB as tolerated unless otherwise ordered Outcome: Completed/Met Date Met:  04/01/12 Using [facility] and ambulating in the halls Goal: Initial discharge plan identified Outcome: Completed/Met Date Met:  04/01/12 Return home with spouse

## 2012-04-01 NOTE — Progress Notes (Signed)
Patient arrived from emergency via stretcher, made comfortable in bed call light within reach.  Patient denies pain at this time, edema present in right foot, redness, wound to bottom of foot between great toe and second toe.  Patient is alert and oriented and needs assistance to ambulate.  ACHS checks and hemoglobin A1C was drawn and lab called, results are 13.5 for hgba1c.  Patient had ct of foot to check for abscess and vanc and zosyn were started.  Will continue to monitor.  Macarthur Critchley, RN

## 2012-04-02 DIAGNOSIS — E871 Hypo-osmolality and hyponatremia: Secondary | ICD-10-CM

## 2012-04-02 DIAGNOSIS — I10 Essential (primary) hypertension: Secondary | ICD-10-CM

## 2012-04-02 DIAGNOSIS — E119 Type 2 diabetes mellitus without complications: Secondary | ICD-10-CM

## 2012-04-02 DIAGNOSIS — L03039 Cellulitis of unspecified toe: Secondary | ICD-10-CM

## 2012-04-02 DIAGNOSIS — L02619 Cutaneous abscess of unspecified foot: Secondary | ICD-10-CM

## 2012-04-02 LAB — CBC
HCT: 34.7 % — ABNORMAL LOW (ref 36.0–46.0)
Hemoglobin: 11.7 g/dL — ABNORMAL LOW (ref 12.0–15.0)
MCV: 87.4 fL (ref 78.0–100.0)
RBC: 3.97 MIL/uL (ref 3.87–5.11)
WBC: 10.6 10*3/uL — ABNORMAL HIGH (ref 4.0–10.5)

## 2012-04-02 LAB — BASIC METABOLIC PANEL
CO2: 28 mEq/L (ref 19–32)
Chloride: 103 mEq/L (ref 96–112)
Creatinine, Ser: 0.64 mg/dL (ref 0.50–1.10)
GFR calc Af Amer: >90 mL/min (ref >90)
Potassium: 4 mEq/L (ref 3.5–5.1)
Sodium: 139 mEq/L (ref 135–145)

## 2012-04-02 LAB — GLUCOSE, CAPILLARY
Glucose-Capillary: 203 mg/dL — ABNORMAL HIGH (ref 70–99)
Glucose-Capillary: 295 mg/dL — ABNORMAL HIGH (ref 70–99)
Glucose-Capillary: 212 mg/dL — ABNORMAL HIGH (ref 70–99)

## 2012-04-02 MED ORDER — INSULIN GLARGINE 100 UNIT/ML ~~LOC~~ SOLN
40.0000 [IU] | Freq: Two times a day (BID) | SUBCUTANEOUS | Status: DC
  Administered 2012-04-02 – 2012-04-04 (×4): 40 [IU] via SUBCUTANEOUS

## 2012-04-02 MED ORDER — METFORMIN HCL 500 MG PO TABS
1000.0000 mg | ORAL_TABLET | Freq: Two times a day (BID) | ORAL | Status: DC
  Administered 2012-04-02 – 2012-04-04 (×4): 1000 mg via ORAL
  Filled 2012-04-02 (×6): qty 2

## 2012-04-02 NOTE — Care Management Note (Signed)
    Page 1 of 2   04/04/2012     11:57:26 AM   CARE MANAGEMENT NOTE 04/04/2012  Patient:  [name], [name]   Account Number:  [id number]  Date Initiated:  04/02/2012  Documentation initiated by:  Letha Cape  Subjective/Objective Assessment:   dx cellulitis and abscess of foot  admit- lives with spouse.  pta independent.     Action/Plan:   Anticipated DC Date:  04/04/2012   Anticipated DC Plan:  HOME W HOME HEALTH SERVICES      DC Planning Services  CM consult      [facility] Choice  HOME HEALTH   Choice offered to / List presented to:  C-1 Patient        HH arranged  HH-1 RN      [facility] agency  [facility]   Status of service:  Completed, signed off Medicare Important Message given?   (If response is "NO", the following Medicare IM given date fields will be blank) Date Medicare IM given:   Date Additional Medicare IM given:    Discharge Disposition:  HOME W HOME HEALTH SERVICES  Per UR Regulation:    If discussed at Long Length of Stay Meetings, dates discussed:    Comments:  04/04/12 11:52 Letha Cape RN, BSN [phone] patient for possible  discharge today, referral  made to [facility],  Marie with [facility] notified.  Soc will begin 24-48 hrs post discharge.  04/02/12 16:52 Letha Cape RN, BSN [phone] patient lives with spouse, pta independent. Patient has medication coverage and transportation.  Per WOC patient will benefit from [facility] for wound care, pateint states she would like to work with [facility].  Will need orders for [facility], already spoke to [facility] about this.

## 2012-04-02 NOTE — Consult Note (Signed)
WOC consult Note Reason for Consult: consulted for ulcer to right foot. Wound type: Full Thickness wound Pressure Ulcer POA: Yes Measurement: 7 1/2 x 5 1/2 cm prior to debridement.  Post debridement reveals 2 wounds that measure 2 x 4 x 0.2 cm superior wound, 1 x 3 x 0.2 cm inferior wound.    Previous callous 0.8 x 0.8 cm. Callous area dry intact, scabbed area to center of callous.    Wound bed: Prior to debridement fluid filled blister with obvious serosanguinous drainage below skin.  Post debridement reveals pink semi-moist tissue with several areas of yellow slough.  90% pink tissue an 10% patchy areas of yellow slough. Drainage (amount, consistency, odor) small amount of purulent drainage from area of slough near 11 o' clock area.   Periwound: Dorsal foot remains erythematous and warm to touch but intact skin.  Pedal pulse is palpable to touch.   Dressing procedure/placement/frequency:  Hydrogel applied to wound bed and covered with guaze, ABD pad and secured with Kerlix dressing.   Conservative sharp wound debridement (CSWD performed at the bedside: Use of scissors and forceps to remove non viable tissue, scalpel used to trim callous on plantar foot.  Tolerated well with minimal bleeding.  Sensation decrease in feet due to history of neuropathy.    Plan: Apply Hydrogel to wound bed daily to provide moisture, cover with guaze and Kerlix.  Blue post op walking shoe ordered from [employer].  Patient could benefit from home health follow up after discharge for dressing changes and for wound to be monitored.    [employer] agent, BSN, WOC Nurse/Yatzari Jonsson Sylvie Farrier, MSN, [employer]

## 2012-04-02 NOTE — Progress Notes (Signed)
DAILY PROGRESS NOTE                              [facility]  SUBJECTIVE: Patient denies fever, denies chills. No complaints  OBJECTIVE: BP 146/73  Pulse 84  Temp(Src) 98 F (36.7 C) (Oral)  Resp 18  Ht 5' 9.6" (1.768 m)  Wt 88.3 kg (194 lb 10.7 oz)  BMI 28.25 kg/m2  SpO2 95%  Intake/Output Summary (Last 24 hours) at 04/02/12 1107 Last data filed at 04/02/12 0900  Gross per 24 hour  Intake 2127.91 ml  Output   2200 ml  Net -72.09 ml                      Weight change: -1.512 kg (-3 lb 5.3 oz) Physical Exam: General: Alert and awake oriented x3 not in any acute distress. HEENT: anicteric sclera, pupils equal reactive to light and accommodation CVS: S1-S2 heard, no murmur rubs or gallops Chest: clear to auscultation bilaterally, no wheezing rales or rhonchi Abdomen:  normal bowel sounds, soft, nontender, nondistended, no organomegaly Neuro: Cranial nerves II-XII intact, no focal neurological deficits Extremities: no cyanosis, no clubbing or edema noted bilaterally   Lab Results:  Basename 04/02/12 0600 04/01/12 0035  NA 139 128*  K 4.0 3.1*  CL 103 92*  CO2 28 21  GLUCOSE 230* 240*  BUN 10 16  CREATININE 0.64 0.58  CALCIUM 8.6 8.9  MG -- --  PHOS -- --    Basename 03/31/12 1537  AST 12  ALT 12  ALKPHOS 72  BILITOT 0.4  PROT 7.2  ALBUMIN 2.7*   No results found for this basename: LIPASE:2,AMYLASE:2 in the last 72 hours  Basename 04/02/12 0600 04/01/12 0035 03/31/12 1537  WBC 10.6* 14.1* --  NEUTROABS -- -- 12.4*  HGB 11.7* 12.4 --  HCT 34.7* 35.1* --  MCV 87.4 85.8 --  PLT 225 223 --   No results found for this basename: CKTOTAL:3,CKMB:3,CKMBINDEX:3,TROPONINI:3 in the last 72 hours No components found with this basename: POCBNP:3 No results found for this basename: DDIMER:2 in the last 72 hours  Basename 03/31/12 1933  HGBA1C 13.5*   No results found for this basename:  CHOL:2,HDL:2,LDLCALC:2,TRIG:2,CHOLHDL:2,LDLDIRECT:2 in the last 72 hours No results found for this basename: TSH,T4TOTAL,FREET3,T3FREE,THYROIDAB in the last 72 hours No results found for this basename: VITAMINB12:2,FOLATE:2,FERRITIN:2,TIBC:2,IRON:2,RETICCTPCT:2 in the last 72 hours  Micro Results: Recent Results (from the past 240 hour(s))  CULTURE, BLOOD (ROUTINE X 2)     Status: Normal (Preliminary result)   Collection Time   03/31/12  3:37 PM      Component Value Range Status Comment   Specimen Description BLOOD LEFT ARM   Final    Special Requests BOTTLES DRAWN AEROBIC AND ANAEROBIC 10CC   Final    Culture  Setup Time [id number]   Final    Culture     Final    Value:        BLOOD CULTURE RECEIVED NO GROWTH TO DATE CULTURE WILL BE HELD FOR 5 DAYS BEFORE ISSUING A FINAL NEGATIVE REPORT   Report Status PENDING   Incomplete   CULTURE, BLOOD (ROUTINE X 2)     Status: Normal (Preliminary result)   Collection Time   03/31/12  3:41 PM      Component Value Range Status Comment   Specimen Description BLOOD RIGHT HAND   Final    Special Requests BOTTLES DRAWN  AEROBIC AND ANAEROBIC 5CC   Final    Culture  Setup Time [id number]   Final    Culture     Final    Value:        BLOOD CULTURE RECEIVED NO GROWTH TO DATE CULTURE WILL BE HELD FOR 5 DAYS BEFORE ISSUING A FINAL NEGATIVE REPORT   Report Status PENDING   Incomplete     Studies/Results: Ct Foot Right W Contrast  04/01/2012  *RADIOLOGY REPORT*  Clinical Data: Diffuse right foot swelling and erythema, with a small sore at the plantar forefoot, near the big toe.  Assess for abscess.  History of diabetes.  CT OF THE RIGHT FOOT WITH CONTRAST  Technique:  Multidetector CT imaging was performed following the standard protocol during bolus administration of intravenous contrast.  Contrast: 80mL OMNIPAQUE IOHEXOL 350 MG/ML SOLN  Comparison: Right foot radiographs performed earlier today at 06:38 p.m.  Findings: Along the plantar aspect of the forefoot  overlying the great toe, there is focal soft tissue irregularity and inflammation, without evidence of a focal fluid collection to suggest abscess.  Additional soft tissue edema is seen tracking about the forefoot, and along the dorsum of the midfoot; mild soft tissue irregularity along the dorsum of the midfoot appears chronic in nature.  There is mild soft tissue edema tracking about the lateral aspect of the ankle.  No definite osseous erosions are seen to suggest osteomyelitis. However, evaluation for osteomyelitis is limited on CT.  Visualized joint spaces are preserved.  No significant degenerative change is appreciated.  A few tiny osseous fragments adjacent to the navicular are likely degenerative in nature.  The ankle mortise appears intact.  The interosseous space is within normal limits.  The subtalar joint is unremarkable in appearance. No significant soft tissue edema is seen along the sinus tarsi.  The flexor and extensor tendons are grossly unremarkable in appearance, aside from overlying soft tissue edema.  The peroneus tendons are unremarkable.  The Achilles tendon remains intact.  No acute vascular abnormalities are identified.  IMPRESSION:  1.  Focal soft tissue irregularity and inflammation along the plantar aspect of the forefoot, overlying the great toe. Additional soft tissue edema tracks about the forefoot, the dorsum of the midfoot, and lateral aspect of the ankle.  No evidence for abscess. 2.  No definite osseous erosions seen to suggest osteomyelitis, though evaluation for osteomyelitis is limited on CT.  No acute osseous abnormalities identified.  Original Report Authenticated By: Tonia Ghent, M.D.   Dg Foot Complete Right  03/31/2012  *RADIOLOGY REPORT*  Clinical Data: Stepped on something earlier this week, now with swelling, pain, and rash.  History of diabetes.  RIGHT FOOT COMPLETE - 3+ VIEW 03/31/2012:  Comparison: None.  Findings: Soft tissue swelling along the plantar surface  of the foot overlying the head of the metatarsals.  No opaque foreign body in the soft tissues.  Moderate hallux valgus.  Osteopenia.  No evidence of acute or subacute fracture or dislocation.  Joint space narrowing involving the IP joints of multiple toes.  Remaining joint spaces well preserved.  Small plantar calcaneal spur.  IMPRESSION: No opaque foreign body in the soft tissues.  No acute osseous abnormality.  Osteopenia.  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  Original Report Authenticated By: Arnell Sieving, M.D.   Medications: Scheduled Meds:    . enoxaparin  40 mg Subcutaneous Q24H  . furosemide  40 mg Oral Daily  . insulin aspart  0-9 Units Subcutaneous  TID WC  . insulin glargine  40 Units Subcutaneous BID  . metFORMIN  1,000 mg Oral BID WC  . mulitivitamin with minerals  1 tablet Oral Daily  . piperacillin-tazobactam (ZOSYN)  IV  3.375 g Intravenous Q8H  . ramipril  10 mg Oral BID  . simvastatin  40 mg Oral QHS  . vancomycin  1,000 mg Intravenous Q12H  . vitamin C  500 mg Oral Daily  . DISCONTD: insulin glargine  34 Units Subcutaneous BID   Continuous Infusions:    . sodium chloride 125 mL/hr at 04/02/12 0319   PRN Meds:.acetaminophen, acetaminophen, morphine injection, ondansetron (ZOFRAN) IV, ondansetron, traZODone  ASSESSMENT & PLAN: Principal Problem:  *Cellulitis and abscess of foot Active Problems:  Dehydration  Hyponatremia  Diabetes mellitus   Right foot cellulitis -Diabetic foot ulcer, we'll obtain wound ostomy care team to see her. -CT scan of right foot showed no bony involvement, no evidence of abscess. -Patient started on broad-spectrum antibiotics including vancomycin and Zosyn.  Diabetes mellitus type 2 -Uncontrolled, hemoglobin A1c is 13.5 which correlate to blood glucose of 340. -Patient is on Lantus insulin, the dose will be adjusted while patient in the hospital here. -I will add metformin 1000 mg twice a  day.  Hyponatremia -Corrected sodium level is about 131, I think dehydration also as playing a role. -Sodium back to normal after aggressive IV fluid hydration.  Dehydration -Hydrate aggressively with IV fluids, patient is on normal saline. -Continued to follow the renal function closely with BMP in the morning.  Hypertension -Continue preadmission home medications.   LOS: 2 days   Quinnton Bury A 04/02/2012, 11:07 AM

## 2012-04-02 NOTE — Progress Notes (Signed)
04/02/2012 [name] SPARKS Case Management Note [phone]  Utilization review completed.  

## 2012-04-02 NOTE — Progress Notes (Signed)
Orthopedic Tech Progress Note Patient Details:  [name] [birth year] [id number]  Other Ortho Devices Type of Ortho Device: Postop boot Ortho Device Location: right foot Ortho Device Interventions: Application   [name] T 04/02/2012, 1:20 PM

## 2012-04-03 DIAGNOSIS — L03039 Cellulitis of unspecified toe: Secondary | ICD-10-CM

## 2012-04-03 DIAGNOSIS — I10 Essential (primary) hypertension: Secondary | ICD-10-CM

## 2012-04-03 DIAGNOSIS — L02619 Cutaneous abscess of unspecified foot: Secondary | ICD-10-CM

## 2012-04-03 DIAGNOSIS — E871 Hypo-osmolality and hyponatremia: Secondary | ICD-10-CM

## 2012-04-03 DIAGNOSIS — E119 Type 2 diabetes mellitus without complications: Secondary | ICD-10-CM

## 2012-04-03 LAB — GLUCOSE, CAPILLARY
Glucose-Capillary: 110 mg/dL — ABNORMAL HIGH (ref 70–99)
Glucose-Capillary: 213 mg/dL — ABNORMAL HIGH (ref 70–99)

## 2012-04-03 LAB — BASIC METABOLIC PANEL
CO2: 29 mEq/L (ref 19–32)
Calcium: 9 mg/dL (ref 8.4–10.5)
Creatinine, Ser: 0.59 mg/dL (ref 0.50–1.10)

## 2012-04-03 LAB — CBC
MCV: 87.8 fL (ref 78.0–100.0)
Platelets: 247 10*3/uL (ref 150–400)
RDW: 12.4 % (ref 11.5–15.5)
WBC: 7.7 10*3/uL (ref 4.0–10.5)

## 2012-04-03 NOTE — Progress Notes (Signed)
DAILY PROGRESS NOTE                              [facility]  SUBJECTIVE: No complaints.  OBJECTIVE: BP 134/79  Pulse 73  Temp(Src) 98.3 F (36.8 C) (Oral)  Resp 17  Ht 5' 9.6" (1.768 m)  Wt 88.225 kg (194 lb 8 oz)  BMI 28.23 kg/m2  SpO2 96%  Intake/Output Summary (Last 24 hours) at 04/03/12 1137 Last data filed at 04/03/12 1023  Gross per 24 hour  Intake    720 ml  Output   1250 ml  Net   -530 ml                      Weight change: -0.075 kg (-2.7 oz) Physical Exam: General: Alert and awake oriented x3 not in any acute distress. HEENT: anicteric sclera, pupils equal reactive to light and accommodation CVS: S1-S2 heard, no murmur rubs or gallops Chest: clear to auscultation bilaterally, no wheezing rales or rhonchi Abdomen:  normal bowel sounds, soft, nontender, nondistended, no organomegaly Neuro: Cranial nerves II-XII intact, no focal neurological deficits Extremities: no cyanosis, no clubbing or edema noted bilaterally   Lab Results:  Basename 04/03/12 0828 04/02/12 0600  NA 138 139  K 4.2 4.0  CL 103 103  CO2 29 28  GLUCOSE 154* 230*  BUN 7 10  CREATININE 0.59 0.64  CALCIUM 9.0 8.6  MG -- --  PHOS -- --    Basename 03/31/12 1537  AST 12  ALT 12  ALKPHOS 72  BILITOT 0.4  PROT 7.2  ALBUMIN 2.7*   No results found for this basename: LIPASE:2,AMYLASE:2 in the last 72 hours  Basename 04/03/12 0828 04/02/12 0600 03/31/12 1537  WBC 7.7 10.6* --  NEUTROABS -- -- 12.4*  HGB 11.9* 11.7* --  HCT 35.1* 34.7* --  MCV 87.8 87.4 --  PLT 247 225 --   No results found for this basename: CKTOTAL:3,CKMB:3,CKMBINDEX:3,TROPONINI:3 in the last 72 hours No components found with this basename: POCBNP:3 No results found for this basename: DDIMER:2 in the last 72 hours  Basename 03/31/12 1933  HGBA1C 13.5*   No results found for this basename: CHOL:2,HDL:2,LDLCALC:2,TRIG:2,CHOLHDL:2,LDLDIRECT:2 in the last 72 hours No results found  for this basename: TSH,T4TOTAL,FREET3,T3FREE,THYROIDAB in the last 72 hours No results found for this basename: VITAMINB12:2,FOLATE:2,FERRITIN:2,TIBC:2,IRON:2,RETICCTPCT:2 in the last 72 hours  Micro Results: Recent Results (from the past 240 hour(s))  CULTURE, BLOOD (ROUTINE X 2)     Status: Normal (Preliminary result)   Collection Time   03/31/12  3:37 PM      Component Value Range Status Comment   Specimen Description BLOOD LEFT ARM   Final    Special Requests BOTTLES DRAWN AEROBIC AND ANAEROBIC 10CC   Final    Culture  Setup Time [id number]   Final    Culture     Final    Value:        BLOOD CULTURE RECEIVED NO GROWTH TO DATE CULTURE WILL BE HELD FOR 5 DAYS BEFORE ISSUING A FINAL NEGATIVE REPORT   Report Status PENDING   Incomplete   CULTURE, BLOOD (ROUTINE X 2)     Status: Normal (Preliminary result)   Collection Time   03/31/12  3:41 PM      Component Value Range Status Comment   Specimen Description BLOOD RIGHT HAND   Final    Special Requests BOTTLES DRAWN AEROBIC AND  ANAEROBIC 5CC   Final    Culture  Setup Time [id number]   Final    Culture     Final    Value:        BLOOD CULTURE RECEIVED NO GROWTH TO DATE CULTURE WILL BE HELD FOR 5 DAYS BEFORE ISSUING A FINAL NEGATIVE REPORT   Report Status PENDING   Incomplete     Studies/Results: Ct Foot Right W Contrast  04/01/2012  *RADIOLOGY REPORT*  Clinical Data: Diffuse right foot swelling and erythema, with a small sore at the plantar forefoot, near the big toe.  Assess for abscess.  History of diabetes.  CT OF THE RIGHT FOOT WITH CONTRAST  Technique:  Multidetector CT imaging was performed following the standard protocol during bolus administration of intravenous contrast.  Contrast: 80mL OMNIPAQUE IOHEXOL 350 MG/ML SOLN  Comparison: Right foot radiographs performed earlier today at 06:38 p.m.  Findings: Along the plantar aspect of the forefoot overlying the great toe, there is focal soft tissue irregularity and inflammation, without  evidence of a focal fluid collection to suggest abscess.  Additional soft tissue edema is seen tracking about the forefoot, and along the dorsum of the midfoot; mild soft tissue irregularity along the dorsum of the midfoot appears chronic in nature.  There is mild soft tissue edema tracking about the lateral aspect of the ankle.  No definite osseous erosions are seen to suggest osteomyelitis. However, evaluation for osteomyelitis is limited on CT.  Visualized joint spaces are preserved.  No significant degenerative change is appreciated.  A few tiny osseous fragments adjacent to the navicular are likely degenerative in nature.  The ankle mortise appears intact.  The interosseous space is within normal limits.  The subtalar joint is unremarkable in appearance. No significant soft tissue edema is seen along the sinus tarsi.  The flexor and extensor tendons are grossly unremarkable in appearance, aside from overlying soft tissue edema.  The peroneus tendons are unremarkable.  The Achilles tendon remains intact.  No acute vascular abnormalities are identified.  IMPRESSION:  1.  Focal soft tissue irregularity and inflammation along the plantar aspect of the forefoot, overlying the great toe. Additional soft tissue edema tracks about the forefoot, the dorsum of the midfoot, and lateral aspect of the ankle.  No evidence for abscess. 2.  No definite osseous erosions seen to suggest osteomyelitis, though evaluation for osteomyelitis is limited on CT.  No acute osseous abnormalities identified.  Original Report Authenticated By: Tonia Ghent, M.D.   Dg Foot Complete Right  03/31/2012  *RADIOLOGY REPORT*  Clinical Data: Stepped on something earlier this week, now with swelling, pain, and rash.  History of diabetes.  RIGHT FOOT COMPLETE - 3+ VIEW 03/31/2012:  Comparison: None.  Findings: Soft tissue swelling along the plantar surface of the foot overlying the head of the metatarsals.  No opaque foreign body in the soft  tissues.  Moderate hallux valgus.  Osteopenia.  No evidence of acute or subacute fracture or dislocation.  Joint space narrowing involving the IP joints of multiple toes.  Remaining joint spaces well preserved.  Small plantar calcaneal spur.  IMPRESSION: No opaque foreign body in the soft tissues.  No acute osseous abnormality.  Osteopenia.  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  Original Report Authenticated By: Arnell Sieving, M.D.   Medications: Scheduled Meds:    . enoxaparin  40 mg Subcutaneous Q24H  . furosemide  40 mg Oral Daily  . insulin aspart  0-9 Units Subcutaneous TID WC  .  insulin glargine  40 Units Subcutaneous BID  . metFORMIN  1,000 mg Oral BID WC  . mulitivitamin with minerals  1 tablet Oral Daily  . piperacillin-tazobactam (ZOSYN)  IV  3.375 g Intravenous Q8H  . ramipril  10 mg Oral BID  . simvastatin  40 mg Oral QHS  . vancomycin  1,000 mg Intravenous Q12H  . vitamin C  500 mg Oral Daily   Continuous Infusions:    . sodium chloride 75 mL/hr at 04/02/12 1347   PRN Meds:.acetaminophen, acetaminophen, morphine injection, ondansetron (ZOFRAN) IV, ondansetron, traZODone  ASSESSMENT & PLAN: Principal Problem:  *Cellulitis and abscess of foot Active Problems:  Dehydration  Hyponatremia  Diabetes mellitus  Interval history Patient is 66 year old Caucasian female well diabetes mellitus type 2 and hyperlipidemia, she came to the hospital with right diabetic foot ulcer. CT scan of right foot failed to show any osteomyelitis or abscess formation. Wound nurse is consulted bedside debridement was done. Patient is on vancomycin and Zosyn. She has uncontrolled diabetes mellitus I increased her Lantus to 40 and added metformin. She will probably be appropriate for discharge in the morning on oral antibiotics, she will need both gram-positive and gram-negative coverage because of the open ulcer in the diabetes.   Right foot cellulitis -Diabetic foot  ulcer,  patient seen by WOC care team.recommendations noted  -CT scan of right foot showed no bony involvement, no evidence of abscess. -Patient is on broad-spectrum antibiotics including vancomycin and Zosyn.  Diabetes mellitus type 2 -Uncontrolled, hemoglobin A1c is 13.5 which correlate to average blood glucose of 340. -Patient is on Lantus insulin, the dose will be adjusted while patient in the hospital here. -I will add metformin 1000 mg twice a day.  Hyponatremia -Corrected sodium level is about 131, I think dehydration also as playing a role. -Sodium back to normal after aggressive IV fluid hydration.  Dehydration -Hydrate aggressively with IV fluids, patient is on normal saline. -Continued to follow the renal function closely with BMP in the morning.  Hypertension -Continue preadmission home medications.   LOS: 3 days   [name] A 04/03/2012, 11:37 AM

## 2012-04-03 NOTE — Progress Notes (Signed)
ANTIBIOTIC CONSULT NOTE - FOLLOW UP  Pharmacy Consult for Vanco Indication: Diabetic foot infection  No Known Allergies  Patient Measurements: Height: 5' 9.6" (176.8 cm) Weight: 194 lb 8 oz (88.225 kg) IBW/kg (Calculated) : 67.58  Adjusted Body Weight:   Vital Signs: Temp: 98.3 F (36.8 C) (05/07 0453) Temp src: Oral (05/07 0453) BP: 134/79 mmHg (05/07 0453) Pulse Rate: 73  (05/07 0453) Intake/Output from previous day: 05/06 0701 - 05/07 0700 In: 580 [P.O.:580] Out: 1250 [Urine:1250] Intake/Output from this shift:    Labs:  Basename 04/03/12 0828 04/02/12 0600 04/01/12 0035  Melissa 7.7 10.6* 14.1*  HGB 11.9* 11.7* 12.4  PLT 247 225 223  LABCREA -- -- --  CREATININE 0.59 0.64 0.58   Estimated Creatinine Clearance: 81.7 ml/min (by C-G formula based on Cr of 0.59).  Basename 04/03/12 0828  VANCOTROUGH 12.2  VANCOPEAK --  VANCORANDOM --  GENTTROUGH --  GENTPEAK --  GENTRANDOM --  TOBRATROUGH --  TOBRAPEAK --  TOBRARND --  AMIKACINPEAK --  AMIKACINTROU --  AMIKACIN --     Microbiology: Recent Results (from the past 720 hour(s))  CULTURE, BLOOD (ROUTINE X 2)     Status: Normal (Preliminary result)   Collection Time   03/31/12  3:37 PM      Component Value Range Status Comment   Specimen Description BLOOD LEFT ARM   Final    Special Requests BOTTLES DRAWN AEROBIC AND ANAEROBIC 10CC   Final    Culture  Setup Time [id number]   Final    Culture     Final    Value:        BLOOD CULTURE RECEIVED NO GROWTH TO DATE CULTURE WILL BE HELD FOR 5 DAYS BEFORE ISSUING A FINAL NEGATIVE REPORT   Report Status PENDING   Incomplete   CULTURE, BLOOD (ROUTINE X 2)     Status: Normal (Preliminary result)   Collection Time   03/31/12  3:41 PM      Component Value Range Status Comment   Specimen Description BLOOD RIGHT HAND   Final    Special Requests BOTTLES DRAWN AEROBIC AND ANAEROBIC 5CC   Final    Culture  Setup Time [id number]   Final    Culture     Final    Value:         BLOOD CULTURE RECEIVED NO GROWTH TO DATE CULTURE WILL BE HELD FOR 5 DAYS BEFORE ISSUING A FINAL NEGATIVE REPORT   Report Status PENDING   Incomplete     Anti-infectives     Start     Dose/Rate Route Frequency Ordered Stop   03/31/12 2100   vancomycin (VANCOCIN) IVPB 1000 mg/200 mL premix        1,000 mg 200 mL/hr over 60 Minutes Intravenous Every 12 hours 03/31/12 2046     03/31/12 2100  piperacillin-tazobactam (ZOSYN) IVPB 3.375 g       3.375 g 12.5 mL/hr over 240 Minutes Intravenous 3 times per day 03/31/12 2046     03/31/12 1745   clindamycin (CLEOCIN) IVPB 900 mg        900 mg 100 mL/hr over 30 Minutes Intravenous To [facility] Emergency Dept 03/31/12 1736 03/31/12 1841          [name], [name], [name], Melissa 7.7 (down), [name].  Vanc 5/4>> Zosyn 5/4>> 5/4 blood cx x2>> still pending  Anticoag: Lovenox 40mg /d. CBC stable.  Cards: Max BP 142/83, HR 73-94 Meds: po Lasix, Altace, Zocor  Endo: hx DM, CBGs 166-295 on lantus 40 units bid, SSI. hgb A1C=13.5 Meds: SSI, Increase Lantus, metformin  Renal: Scr 0.59 with CrCl 82  Best practices: enox 40  Plan: Zosyn 3.375g IV q8h. Vanco 1g/12h. Level in am.   Cooke of Therapy:  Vancomycin trough level 10-15 mcg/ml  Plan:  Continue Vanco 1g/12h. Zosyn 3.375g IV q8hr. Dose ok  Merilynn Finland, [employer] 04/03/2012,9:46 AM

## 2012-04-03 NOTE — Progress Notes (Signed)
Inpatient Diabetes Program Recommendations  AACE/ADA: New Consensus Statement on Inpatient Glycemic Control (2009)  Target Ranges:  Prepandial:   less than 140 mg/dL      Peak postprandial:   less than 180 mg/dL (1-2 hours)      Critically ill patients:  140 - 180 mg/dL   Reason for Visit: Results for SEBA, MADOLE (MRN [id number]) as of 04/03/2012 12:33  Ref. Range 04/02/2012 17:19 04/02/2012 21:26 04/03/2012 08:08 04/03/2012 08:28 04/03/2012 12:22  Glucose-Capillary Latest Range: 70-99 mg/dL 409 (H) 811 (H) 914 (H)  217 (H)   Consider adding Novolog meal coverage 4 units tid with meals to cover carbohydrate intake.    Note: Will follow.

## 2012-04-04 DIAGNOSIS — L02619 Cutaneous abscess of unspecified foot: Secondary | ICD-10-CM

## 2012-04-04 DIAGNOSIS — E119 Type 2 diabetes mellitus without complications: Secondary | ICD-10-CM

## 2012-04-04 DIAGNOSIS — L03039 Cellulitis of unspecified toe: Secondary | ICD-10-CM

## 2012-04-04 DIAGNOSIS — E871 Hypo-osmolality and hyponatremia: Secondary | ICD-10-CM

## 2012-04-04 DIAGNOSIS — I10 Essential (primary) hypertension: Secondary | ICD-10-CM

## 2012-04-04 LAB — GLUCOSE, CAPILLARY: Glucose-Capillary: 91 mg/dL (ref 70–99)

## 2012-04-04 MED ORDER — INSULIN GLARGINE 100 UNIT/ML ~~LOC~~ SOLN: 38.0000 [IU] | Freq: Two times a day (BID) | SUBCUTANEOUS | Status: DC

## 2012-04-04 MED ORDER — METFORMIN HCL 1000 MG PO TABS: 1000.0000 mg | ORAL_TABLET | Freq: Two times a day (BID) | ORAL | Status: DC

## 2012-04-04 MED ORDER — DOXYCYCLINE HYCLATE 50 MG PO CAPS: 100.0000 mg | ORAL_CAPSULE | Freq: Two times a day (BID) | ORAL | Status: AC

## 2012-04-04 MED ORDER — AMOXICILLIN-POT CLAVULANATE 875-125 MG PO TABS: 1.0000 | ORAL_TABLET | Freq: Two times a day (BID) | ORAL | Status: AC

## 2012-04-04 NOTE — Progress Notes (Signed)
Fernande Bras to be D/C'd Home per MD order.  Discussed with the patient the After Visit Summary and all questions fully answered.  Susann Givens, RN, [facility] 04/04/2012 1:00 PM

## 2012-04-04 NOTE — Discharge Summary (Signed)
PATIENT DETAILS Name: [name] Age: 66 y.o. Sex: female Date of Birth: May 22, 1946 MRN: [id number]. Admit Date: 03/31/2012 Admitting Physician: Rometta Emery, MD [id number], Chrissie Noa, MD, MD  PRIMARY DISCHARGE DIAGNOSIS:  Principal Problem:  *Cellulitis and abscess of foot Active Problems:  Dehydration  Hyponatremia  Diabetes mellitus Hypertension Dyslipidemia      PAST MEDICAL HISTORY: Past Medical History  Diagnosis Date  . Diabetes mellitus     DISCHARGE MEDICATIONS: Medication List  As of 04/04/2012  3:07 PM   TAKE these medications         amoxicillin-clavulanate 875-125 MG per tablet   Commonly known as: AUGMENTIN   Take 1 tablet by mouth 2 (two) times daily.      doxycycline 50 MG capsule   Commonly known as: VIBRAMYCIN   Take 2 capsules (100 mg total) by mouth 2 (two) times daily.      furosemide 40 MG tablet   Commonly known as: LASIX   Take 40 mg by mouth daily.      insulin glargine 100 UNIT/ML injection   Commonly known as: LANTUS   Inject 38 Units into the skin 2 (two) times daily.      metFORMIN 1000 MG tablet   Commonly known as: GLUCOPHAGE   Take 1 tablet (1,000 mg total) by mouth 2 (two) times daily with a meal.      mulitivitamin with minerals Tabs   Take 1 tablet by mouth daily.      ramipril 10 MG capsule   Commonly known as: ALTACE   Take 10 mg by mouth daily.      SALMON PO   Take 1 capsule by mouth daily.      simvastatin 40 MG tablet   Commonly known as: ZOCOR   Take 40 mg by mouth at bedtime.      VITAMIN C PO   Take 1 tablet by mouth daily.             BRIEF HPI:  See H&P, Labs, Consult and Test reports for all details in brief,A 66 year old female with history of diabetes hyperlipidemia who has been doing fine until about a week prior to admission, when she noticed a small swelling on the bottom of her foot. These gradually progressed to swelling of whole right leg especially the foot. It has changed color to be  red, persistent leg swelling mildly tender. She was also found in the emergency room to have significantly elevated blood sugars. She was then admitted to the hospitalist service for further management.  CONSULTATIONS:   None  PERTINENT RADIOLOGIC STUDIES: Ct Foot Right W Contrast  04/01/2012  *RADIOLOGY REPORT*  Clinical Data: Diffuse right foot swelling and erythema, with a small sore at the plantar forefoot, near the big toe.  Assess for abscess.  History of diabetes.  CT OF THE RIGHT FOOT WITH CONTRAST  Technique:  Multidetector CT imaging was performed following the standard protocol during bolus administration of intravenous contrast.  Contrast: 80mL OMNIPAQUE IOHEXOL 350 MG/ML SOLN  Comparison: Right foot radiographs performed earlier today at 06:38 p.m.  Findings: Along the plantar aspect of the forefoot overlying the great toe, there is focal soft tissue irregularity and inflammation, without evidence of a focal fluid collection to suggest abscess.  Additional soft tissue edema is seen tracking about the forefoot, and along the dorsum of the midfoot; mild soft tissue irregularity along the dorsum of the midfoot appears chronic in nature.  There is mild soft tissue edema  tracking about the lateral aspect of the ankle.  No definite osseous erosions are seen to suggest osteomyelitis. However, evaluation for osteomyelitis is limited on CT.  Visualized joint spaces are preserved.  No significant degenerative change is appreciated.  A few tiny osseous fragments adjacent to the navicular are likely degenerative in nature.  The ankle mortise appears intact.  The interosseous space is within normal limits.  The subtalar joint is unremarkable in appearance. No significant soft tissue edema is seen along the sinus tarsi.  The flexor and extensor tendons are grossly unremarkable in appearance, aside from overlying soft tissue edema.  The peroneus tendons are unremarkable.  The Achilles tendon remains intact.  No  acute vascular abnormalities are identified.  IMPRESSION:  1.  Focal soft tissue irregularity and inflammation along the plantar aspect of the forefoot, overlying the great toe. Additional soft tissue edema tracks about the forefoot, the dorsum of the midfoot, and lateral aspect of the ankle.  No evidence for abscess. 2.  No definite osseous erosions seen to suggest osteomyelitis, though evaluation for osteomyelitis is limited on CT.  No acute osseous abnormalities identified.  Original Report Authenticated By: Tonia Ghent, M.D.   Dg Foot Complete Right  03/31/2012  *RADIOLOGY REPORT*  Clinical Data: Stepped on something earlier this week, now with swelling, pain, and rash.  History of diabetes.  RIGHT FOOT COMPLETE - 3+ VIEW 03/31/2012:  Comparison: None.  Findings: Soft tissue swelling along the plantar surface of the foot overlying the head of the metatarsals.  No opaque foreign body in the soft tissues.  Moderate hallux valgus.  Osteopenia.  No evidence of acute or subacute fracture or dislocation.  Joint space narrowing involving the IP joints of multiple toes.  Remaining joint spaces well preserved.  Small plantar calcaneal spur.  IMPRESSION: No opaque foreign body in the soft tissues.  No acute osseous abnormality.  Osteopenia.  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  Original Report Authenticated By: Arnell Sieving, M.D.     PERTINENT LAB RESULTS: CBC:  Basename 04/03/12 0828 04/02/12 0600  WBC 7.7 10.6*  HGB 11.9* 11.7*  HCT 35.1* 34.7*  PLT 247 225   CMET CMP     Component Value Date/Time   NA 138 04/03/2012 0828   K 4.2 04/03/2012 0828   CL 103 04/03/2012 0828   CO2 29 04/03/2012 0828   GLUCOSE 154* 04/03/2012 0828   BUN 7 04/03/2012 0828   CREATININE 0.59 04/03/2012 0828   CALCIUM 9.0 04/03/2012 0828   PROT 7.2 03/31/2012 1537   ALBUMIN 2.7* 03/31/2012 1537   AST 12 03/31/2012 1537   ALT 12 03/31/2012 1537   ALKPHOS 72 03/31/2012 1537   BILITOT 0.4 03/31/2012 1537   GFRNONAA  >90 04/03/2012 0828   GFRAA >90 04/03/2012 0828    GFR Estimated Creatinine Clearance: 81.7 ml/min (by C-G formula based on Cr of 0.59). No results found for this basename: LIPASE:2,AMYLASE:2 in the last 72 hours No results found for this basename: CKTOTAL:3,CKMB:3,CKMBINDEX:3,TROPONINI:3 in the last 72 hours No components found with this basename: POCBNP:3 No results found for this basename: DDIMER:2 in the last 72 hours No results found for this basename: HGBA1C:2 in the last 72 hours No results found for this basename: CHOL:2,HDL:2,LDLCALC:2,TRIG:2,CHOLHDL:2,LDLDIRECT:2 in the last 72 hours No results found for this basename: TSH,T4TOTAL,FREET3,T3FREE,THYROIDAB in the last 72 hours No results found for this basename: VITAMINB12:2,FOLATE:2,FERRITIN:2,TIBC:2,IRON:2,RETICCTPCT:2 in the last 72 hours Coags: No results found for this basename: PT:2,INR:2 in the last  72 hours Microbiology: Recent Results (from the past 240 hour(s))  CULTURE, BLOOD (ROUTINE X 2)     Status: Normal (Preliminary result)   Collection Time   03/31/12  3:37 PM      Component Value Range Status Comment   Specimen Description BLOOD LEFT ARM   Final    Special Requests BOTTLES DRAWN AEROBIC AND ANAEROBIC 10CC   Final    Culture  Setup Time [id number]   Final    Culture     Final    Value:        BLOOD CULTURE RECEIVED NO GROWTH TO DATE CULTURE WILL BE HELD FOR 5 DAYS BEFORE ISSUING A FINAL NEGATIVE REPORT   Report Status PENDING   Incomplete   CULTURE, BLOOD (ROUTINE X 2)     Status: Normal (Preliminary result)   Collection Time   03/31/12  3:41 PM      Component Value Range Status Comment   Specimen Description BLOOD RIGHT HAND   Final    Special Requests BOTTLES DRAWN AEROBIC AND ANAEROBIC 5CC   Final    Culture  Setup Time [id number]   Final    Culture     Final    Value:        BLOOD CULTURE RECEIVED NO GROWTH TO DATE CULTURE WILL BE HELD FOR 5 DAYS BEFORE ISSUING A FINAL NEGATIVE REPORT   Report Status  PENDING   Incomplete      BRIEF HOSPITAL COURSE:   Principal Problem: Diabetic right foot -Patient was admitted with a right diabetic foot ulcer and surrounding cellulitis. A wound care consultation was obtained during this admission, at bedside debridement was done. She was empirically started on vancomycin and Zosyn. Her right leg was elevated as well. She slowly made significant clinical improvement, although this is my first day to see her, she claimed that her leg was almost 75% better than what it was, and she was requesting discharge home. A CT scan of the right leg did not demonstrate any abscess or osteomyelitis. At her own request, patient is being discharged home, on discharge she will be transitioned to doxycycline and Augmentin. Arrangements for home RN for further wound care has already been made. She has also been asked to follow up with Dr. Lajoyce Corners. I've explained in detail to both the patient and husband,  in case if she had worsening of her cellulitis or worsening ulceration, she should promptly present back to the emergency room. Both of them have claimed understanding.  Diabetes mellitus type 2  -Uncontrolled, hemoglobin A1c is 13.5 which correlate to average blood glucose of 340.  -Patient is on Lantus insulin, the dose was increased to 38 units BID on discharge and Metformin was added as well.Marland Kitchen   Hyponatremia  -Corrected sodium level is about 131, I think dehydration also as playing a role.  -Sodium back to normal after aggressive IV fluid hydration.  Dehydration -resolved with hydration  Hypertension -Resume her ramipril and Lasix  Dyslipidemia -Continue with simvastatin  TODAY-DAY OF DISCHARGE:  Subjective:   Blenda Mounts today has no headache,no chest abdominal pain,no new weakness tingling or numbness, feels much better wants to go home today. She claims that her right foot cellulitis is almost 75% better than what she came in with and is requesting that she be  discharged home today.  Objective:   Blood pressure 134/84, pulse 81, temperature 97.8 F (36.6 C), temperature source Oral, resp. rate 18, height 5' 9.6" (1.768 m), weight 88.225  kg (194 lb 8 oz), SpO2 94.00%.  Intake/Output Summary (Last 24 hours) at 04/04/12 1507 Last data filed at 04/04/12 0430  Gross per 24 hour  Intake    660 ml  Output      0 ml  Net    660 ml    Exam Awake Alert, Oriented *3, No new F.N deficits, Normal affect [place].AT,PERRAL Supple Neck,No JVD, No cervical lymphadenopathy appriciated.  Symmetrical Chest wall movement, Good air movement bilaterally, CTAB RRR,No Gallops,Rubs or new Murmurs, No Parasternal Heave +ve B.Sounds, Abd Soft, Non tender, No organomegaly appriciated, No rebound -guarding or rigidity. No Cyanosis, Clubbing or edema, No new Rash or bruise  DISPOSITION: Home   DISCHARGE INSTRUCTIONS:    Follow-up Information    Follow up with Nadara Mustard, MD on 04/09/2012. (appointment at 8:15 am)    Contact information:   [address] [place] [address] [phone]       Follow up with Johny Blamer, MD. Schedule an appointment as soon as possible for a visit in 2 weeks.   Contact information:   [facility] [address] [place] [address] [phone]          Total Time spent on discharge equals 45 minutes.  SignedJeoffrey Massed 04/04/2012 3:07 PM

## 2012-04-07 LAB — CULTURE, BLOOD (ROUTINE X 2)
Culture  Setup Time: 201305050100
Culture  Setup Time: 201305050100
Culture: NO GROWTH
Culture: NO GROWTH

## 2012-04-09 NOTE — ED Provider Notes (Signed)
I saw and evaluated the patient, reviewed the resident's note and I agree with the findings and plan.  66 year old female with right foot pain and swelling. On exam has an infected diabetic foot. No areas of tracking to suggest osteomyelitis. X-rays without evidence of osteomyelitis although sensitivity of plain films is low, especially early in the disease process. There is no subcutaneous gas. Do not feel that outpatient antibiotics will be sufficient. Admission for further treatment and evaluation.  Raeford Razor, MD 04/09/12 [phone]

## 2012-08-15 DIAGNOSIS — E113599 Type 2 diabetes mellitus with proliferative diabetic retinopathy without macular edema, unspecified eye: Secondary | ICD-10-CM | POA: Insufficient documentation

## 2012-08-15 DIAGNOSIS — Z961 Presence of intraocular lens: Secondary | ICD-10-CM | POA: Insufficient documentation

## 2012-11-28 DIAGNOSIS — I219 Acute myocardial infarction, unspecified: Secondary | ICD-10-CM

## 2012-11-28 HISTORY — DX: Acute myocardial infarction, unspecified: I21.9

## 2012-12-01 ENCOUNTER — Encounter (HOSPITAL_COMMUNITY): Payer: Self-pay | Admitting: Nurse Practitioner

## 2012-12-01 ENCOUNTER — Emergency Department (HOSPITAL_COMMUNITY)
Admission: EM | Admit: 2012-12-01 | Discharge: 2012-12-01 | Disposition: A | Discharge status: Home / Self Care | Payer: Medicare Other | Attending: Emergency Medicine | Admitting: Emergency Medicine

## 2012-12-01 DIAGNOSIS — Y929 Unspecified place or not applicable: Secondary | ICD-10-CM | POA: Insufficient documentation

## 2012-12-01 DIAGNOSIS — Y939 Activity, unspecified: Secondary | ICD-10-CM | POA: Insufficient documentation

## 2012-12-01 DIAGNOSIS — E119 Type 2 diabetes mellitus without complications: Secondary | ICD-10-CM | POA: Insufficient documentation

## 2012-12-01 DIAGNOSIS — S335XXA Sprain of ligaments of lumbar spine, initial encounter: Secondary | ICD-10-CM | POA: Insufficient documentation

## 2012-12-01 DIAGNOSIS — Z79899 Other long term (current) drug therapy: Secondary | ICD-10-CM | POA: Insufficient documentation

## 2012-12-01 DIAGNOSIS — S39012A Strain of muscle, fascia and tendon of lower back, initial encounter: Secondary | ICD-10-CM

## 2012-12-01 DIAGNOSIS — X500XXA Overexertion from strenuous movement or load, initial encounter: Secondary | ICD-10-CM | POA: Insufficient documentation

## 2012-12-01 DIAGNOSIS — S239XXA Sprain of unspecified parts of thorax, initial encounter: Secondary | ICD-10-CM | POA: Insufficient documentation

## 2012-12-01 DIAGNOSIS — I10 Essential (primary) hypertension: Secondary | ICD-10-CM | POA: Insufficient documentation

## 2012-12-01 DIAGNOSIS — Z794 Long term (current) use of insulin: Secondary | ICD-10-CM | POA: Insufficient documentation

## 2012-12-01 DIAGNOSIS — E7800 Pure hypercholesterolemia, unspecified: Secondary | ICD-10-CM | POA: Insufficient documentation

## 2012-12-01 HISTORY — DX: Pure hypercholesterolemia, unspecified: E78.00

## 2012-12-01 HISTORY — DX: Essential (primary) hypertension: I10

## 2012-12-01 MED ORDER — DIAZEPAM 5 MG PO TABS: 5.0000 mg | ORAL_TABLET | Freq: Three times a day (TID) | ORAL | Status: DC | PRN

## 2012-12-01 MED ORDER — DIAZEPAM 5 MG PO TABS
5.0000 mg | ORAL_TABLET | Freq: Once | ORAL | Status: AC
  Administered 2012-12-01: 5 mg via ORAL
  Filled 2012-12-01: qty 1

## 2012-12-01 MED ORDER — HYDROMORPHONE HCL PF 1 MG/ML IJ SOLN
1.0000 mg | Freq: Once | INTRAMUSCULAR | Status: AC
  Administered 2012-12-01: 1 mg via INTRAMUSCULAR
  Filled 2012-12-01: qty 1

## 2012-12-01 MED ORDER — OXYCODONE-ACETAMINOPHEN 5-325 MG PO TABS: 1.0000 | ORAL_TABLET | ORAL | Status: DC | PRN

## 2012-12-01 NOTE — ED Notes (Signed)
Pt reports L lower back pain onset earlier this week. States she has never had back pain before and denies any injuries. PCP gave her 800 mg ibuprofen and flexeril with no relief of pain last night, ambulatory, mAe

## 2012-12-01 NOTE — ED Provider Notes (Signed)
History   This chart was scribed for Raeford Razor, MD by Sofie Rower, ED Scribe. The patient was seen in room TR07C/TR07C and the patient's care was started at 6:31PM.      CSN: [id number]  Arrival date & time 12/01/12  1726   First MD Initiated Contact with Patient 12/01/12 1831      Chief Complaint  Patient presents with  . Back Pain    (Consider location/radiation/quality/duration/timing/severity/associated sxs/prior treatment) Patient is a 67 y.o. female presenting with back pain. The history is provided by the patient. No language interpreter was used.  Back Pain  This is a new problem. The current episode started more than 2 days ago. The problem occurs constantly. The problem has been gradually worsening. The pain is associated with no known injury. The pain is present in the lumbar spine. The quality of the pain is described as aching. The pain does not radiate. The pain is moderate. The symptoms are aggravated by twisting and certain positions. The pain is the same all the time. Pertinent negatives include no bladder incontinence and no dysuria. She has tried muscle relaxants for the symptoms. The treatment provided no relief.    [name] is a 67 y.o. female , with a hx of diabetes and hypertension, who presents to the Emergency Department complaining ofsudden, progressively worsening, back pain located at the lower thoracic and upper lumbar region, onset five days ago (11/27/12). The pt has taken ibuprofen and muscle relaxer's which do not provide relief of the back pain. Modifying factors include certain movements and positions, specifically lying down on the left side which intensifies the back pain.  The pt denies dysuria, increased urinary frequency, and rash. Furthermore, the pt denies experiencing any fall or injury which may have prompted her back pain at present.   The pt does not smoke or drink alcohol.   PCP is Dr. Tiburcio Pea.    Past Medical History  Diagnosis  Date  . Diabetes mellitus   . Hypertension   . Hypercholesteremia     Past Surgical History  Procedure Date  . Eye surgery     History reviewed. No pertinent family history.  History  Substance Use Topics  . Smoking status: Never Smoker   . Smokeless tobacco: Never Used  . Alcohol Use: No    OB History    Grav Para Term Preterm Abortions TAB SAB Ect Mult Living                  Review of Systems  Genitourinary: Negative for bladder incontinence and dysuria.  Musculoskeletal: Positive for back pain.  All other systems reviewed and are negative.    Allergies  Review of patient's allergies indicates no known allergies.  Home Medications   Current Outpatient Rx  Name  Route  Sig  Dispense  Refill  . VITAMIN C PO   Oral   Take 1 tablet by mouth daily.         Marland Kitchen VITAMIN D3 1000 UNITS PO CAPS   Oral   Take 2,000 Units by mouth daily.         . CYCLOBENZAPRINE HCL 5 MG PO TABS   Oral   Take 5 mg by mouth 3 (three) times daily as needed. For muscle spasms         . FUROSEMIDE 40 MG PO TABS   Oral   Take 40 mg by mouth daily.         . IBUPROFEN 800 MG  PO TABS   Oral   Take 800 mg by mouth every 8 (eight) hours as needed. For pain         . INSULIN GLARGINE 100 UNIT/ML [place] SOLN   Subcutaneous   Inject 10 Units into the skin 2 (two) times daily.          Marland Kitchen METFORMIN HCL 1000 MG PO TABS   Oral   Take 1 tablet (1,000 mg total) by mouth 2 (two) times daily with a meal.   60 tablet   0   . ADULT MULTIVITAMIN W/MINERALS CH   Oral   Take 1 tablet by mouth daily.         Marland Kitchen SALMON PO   Oral   Take 1 capsule by mouth daily.         Marland Kitchen RAMIPRIL 10 MG PO CAPS   Oral   Take 10 mg by mouth daily.         Marland Kitchen SIMVASTATIN 40 MG PO TABS   Oral   Take 40 mg by mouth at bedtime.           BP 158/78  Pulse 79  Temp 97.8 F (36.6 C) (Oral)  Resp 16  SpO2 97%  Physical Exam  Nursing note and vitals reviewed. Constitutional: She appears  well-developed and well-nourished. No distress.  HENT:  Head: Normocephalic and atraumatic.  Eyes: Conjunctivae normal are normal. Right eye exhibits no discharge. Left eye exhibits no discharge.  Neck: Neck supple.  Cardiovascular: Normal rate, regular rhythm and normal heart sounds.  Exam reveals no gallop and no friction rub.   No murmur heard. Pulmonary/Chest: Effort normal and breath sounds normal. No respiratory distress.  Abdominal: Soft. She exhibits no distension. There is no tenderness.  Musculoskeletal: She exhibits no edema and no tenderness.       Thoracic back: She exhibits tenderness.       Lumbar back: She exhibits tenderness.       Perispinal lower thoracic tenderness and upper lumbar tenderness detected. No midline tenderness detected. No concerning skin changes. Sensation intact lower extremities. Strength 5/5 bilateral lower extremities.   Neurological: She is alert.  Skin: Skin is warm and dry.  Psychiatric: She has a normal mood and affect. Her behavior is normal. Thought content normal.    ED Course  Procedures (including critical care time)  DIAGNOSTIC STUDIES: Oxygen Saturation is 97% on room air, normal by my interpretation.    COORDINATION OF CARE:  6:52 PM- Treatment plan discussed with patient. Pt agrees with treatment.      Labs Reviewed - No data to display No results found.   1. Back strain       MDM  67 year old female with atraumatic lower back pain. Suspect strain.  Non focal neuro exam. No blood thinning medications. No bladder/bowel incontinence or retention. Denies hx of IV drug use. Doubt cauda equina, spinal epidural abscess, spinal epidural hematoma, fracture, vertebral osteomyelitis/discitis or other potential emergent etiology. No indication for emergent imaging.  Plan symptomatic tx. Return precautions discussed. Outpt FU otherwise.      I personally preformed the services scribed in my presence. The recorded information has  been reviewed is accurate. Raeford Razor, MD.   Raeford Razor, MD 12/04/12 2352

## 2012-12-10 ENCOUNTER — Emergency Department (HOSPITAL_COMMUNITY)
Admission: EM | Admit: 2012-12-10 | Discharge: 2012-12-10 | Disposition: A | Discharge status: Home / Self Care | Payer: Medicare Other | Source: Home / Self Care | Attending: Emergency Medicine | Admitting: Emergency Medicine

## 2012-12-10 ENCOUNTER — Emergency Department (INDEPENDENT_AMBULATORY_CARE_PROVIDER_SITE_OTHER): Payer: Medicare Other

## 2012-12-10 ENCOUNTER — Encounter (HOSPITAL_COMMUNITY): Payer: Self-pay | Admitting: *Deleted

## 2012-12-10 DIAGNOSIS — J90 Pleural effusion, not elsewhere classified: Secondary | ICD-10-CM

## 2012-12-10 DIAGNOSIS — N390 Urinary tract infection, site not specified: Secondary | ICD-10-CM

## 2012-12-10 DIAGNOSIS — M5136 Other intervertebral disc degeneration, lumbar region: Secondary | ICD-10-CM

## 2012-12-10 DIAGNOSIS — M4850XA Collapsed vertebra, not elsewhere classified, site unspecified, initial encounter for fracture: Secondary | ICD-10-CM

## 2012-12-10 LAB — POCT URINALYSIS DIP (DEVICE)
Bilirubin Urine: NEGATIVE
Glucose, UA: NEGATIVE mg/dL
Ketones, ur: NEGATIVE mg/dL
Protein, ur: NEGATIVE mg/dL

## 2012-12-10 LAB — GLUCOSE, CAPILLARY: Glucose-Capillary: 100 mg/dL — ABNORMAL HIGH (ref 70–99)

## 2012-12-10 MED ORDER — OXYCODONE-ACETAMINOPHEN 5-325 MG PO TABS: ORAL_TABLET | ORAL | Status: DC

## 2012-12-10 MED ORDER — ONDANSETRON HCL 4 MG/2ML IJ SOLN
4.0000 mg | Freq: Once | INTRAMUSCULAR | Status: AC
  Administered 2012-12-10: 4 mg via INTRAMUSCULAR

## 2012-12-10 MED ORDER — ONDANSETRON HCL 4 MG/2ML IJ SOLN
INTRAMUSCULAR | Status: AC
  Filled 2012-12-10: qty 2

## 2012-12-10 MED ORDER — HYDROMORPHONE HCL PF 1 MG/ML IJ SOLN
1.0000 mg | Freq: Once | INTRAMUSCULAR | Status: AC
  Administered 2012-12-10: 1 mg via INTRAMUSCULAR

## 2012-12-10 MED ORDER — HYDROCODONE-ACETAMINOPHEN 5-325 MG PO TABS
ORAL_TABLET | ORAL | Status: AC
  Filled 2012-12-10: qty 2

## 2012-12-10 MED ORDER — CEPHALEXIN 500 MG PO CAPS: 500.0000 mg | ORAL_CAPSULE | Freq: Three times a day (TID) | ORAL | Status: DC

## 2012-12-10 MED ORDER — ONDANSETRON 8 MG PO TBDP: 8.0000 mg | ORAL_TABLET | Freq: Three times a day (TID) | ORAL | Status: DC | PRN

## 2012-12-10 MED ORDER — HYDROCODONE-ACETAMINOPHEN 5-325 MG PO TABS
2.0000 | ORAL_TABLET | Freq: Once | ORAL | Status: AC
  Administered 2012-12-10: 2 via ORAL

## 2012-12-10 MED ORDER — HYDROMORPHONE HCL PF 1 MG/ML IJ SOLN
INTRAMUSCULAR | Status: AC
  Filled 2012-12-10: qty 1

## 2012-12-10 NOTE — ED Notes (Signed)
Pt  Reports    Severe  l  Flank    Pain        Pt  Reports   Pain is  l  Side  Of  Her  Body          She      denys  Ay  Other   Symptoms           Pt      Reports  Symptoms  Not  releived       By  Muscle  relaxers          Pt is  A  Known  Diabetic  -  Husband  Is  At the  Bedside

## 2012-12-10 NOTE — ED Provider Notes (Signed)
Chief Complaint  Patient presents with  . Back Pain    History of Present Illness:   [name] is a 67 year old female with diabetes and hypertension who has had a ten-day history of both lower and upper back pain. The upper back pain is localized around the left shoulder blade. The pain is rated a 55/10 in intensity. It's worse if she moves, breathes, or talks. It's not relieved by anything in particular. She feels slightly short of breath because of the pain. She denies any fever, chills, cough, or hemoptysis. She has had no anterior chest pain abdominal pain. She denies any paresthesias, numbness, or tingling in the lower extremities. There is no muscle weakness. She denies any dysuria, frequency, urgency, or hematuria. She has had no trauma, history of cancer, immunosuppression, her history of osteoporosis.  Review of Systems:  Other than noted above, the patient denies any of the following symptoms: Systemic:  No fever, chills, severe fatigue, or unexplained weight loss. GI:  No abdominal pain, nausea, vomiting, diarrhea, constipation, incontinence of bowel, or blood in stool. GU:  No dysuria, frequency, urgency, or hematuria. No incontinence of urine or difficulty urinating.  M-S:  No neck pain, joint pain, arthritis, or myalgias. Neuro:  No paresthesias, saddle anesthesia, muscular weakness, or progressive neurological deficit.  PMFSH:  Past medical history, family history, social history, meds, and allergies were reviewed. Specifically, there is no history of cancer, major trauma, osteoporosis, immunosuppression, HIV, or IV or injection drug use.   Physical Exam:   Vital signs:  BP 158/97  Pulse 81  Temp 98 F (36.7 C) (Oral)  Resp 20  SpO2 98% General:  Alert, oriented, in no distress. Abdomen:  Soft, non-tender.  No organomegaly or mass.  No pulsatile midline abdominal mass or bruit. Back:  She has generalized pain in the back from the mid scapular area on down to the  sacrum. Her back has a limited range of motion with 85 of flexion, 10 of extension, 10 of lateral bending in each direction, and 20 of rotation with pain. Straight leg raising was negative. Neuro:  Normal muscle strength, sensations and DTRs. Extremities: Pedal pulses were full, there was no edema. Skin:  Clear, warm and dry.  No rash.  Labs:   Results for orders placed during the hospital encounter of 12/10/12  POCT URINALYSIS DIP (DEVICE)      Component Value Range   Glucose, UA NEGATIVE  NEGATIVE mg/dL   Bilirubin Urine NEGATIVE  NEGATIVE   Ketones, ur NEGATIVE  NEGATIVE mg/dL   Specific Gravity, Urine 1.010  1.005 - 1.030   Hgb urine dipstick SMALL (*) NEGATIVE   pH 6.5  5.0 - 8.0   Protein, ur NEGATIVE  NEGATIVE mg/dL   Urobilinogen, UA 0.2  0.0 - 1.0 mg/dL   Nitrite POSITIVE (*) NEGATIVE   Leukocytes, UA LARGE (*) NEGATIVE  GLUCOSE, CAPILLARY      Component Value Range   Glucose-Capillary 100 (*) 70 - 99 mg/dL     Radiology:  Dg Chest 2 View  12/10/2012  *RADIOLOGY REPORT*  Clinical Data: Back pain with inspiration.  CHEST - 2 VIEW  Comparison: 02/07/2011.  Findings: Trachea is midline.  Heart is mildly enlarged. Left hilar prominence is likely stable.  Lungs are low in volume with linear densities at both lung bases.  Possible tiny right pleural effusion.  Mid thoracic compression fracture is new.  IMPRESSION:  1.  Low lung volumes with bibasilar atelectasis and/or scarring. 2.  Tiny right pleural effusion. 3.  Mid thoracic compression fracture is new from 02/07/2011.   Original Report Authenticated By: Leanna Battles, M.D.    Dg Lumbar Spine Complete  12/10/2012  *RADIOLOGY REPORT*  Clinical Data: Back pain.  Left flank pain.  LUMBAR SPINE - COMPLETE 4+ VIEW  Comparison: None.  Findings: No vertebral compression deformity.  Anatomic alignment. Mild narrowing of the L3-4 and L4-5 disc.  Mild  facet arthropathy at L4-5 and L5-S1.  No pars defect.  Mild vascular calcifications.   IMPRESSION: No acute bony pathology.  Mild degenerative change.   Original Report Authenticated By: Jolaine Click, M.D.     Assessment:  The primary encounter diagnosis was UTI (lower urinary tract infection). Diagnoses of Vertebral compression fracture, Pleural effusion, and Degenerative disc disease, lumbar were also pertinent to this visit.  She has several reasons to have back pain. First draw she has urinary tract infection and we have obtained a urine culture and started her on cephalexin. She is to followup with Dr. Tiburcio Pea about this in 2 weeks. Second she has a vertebral fracture in the midthoracic area. She needs a CT scan. I called Dr. Tiburcio Pea in his office will take care of ordering this and following up on that. She may need a vertebroplasty. Third she has a very tiny pleural effusion on the right which I think is of no significance at all and unrelated to her pain.  Plan:   1.  The following meds were prescribed:   New Prescriptions   CEPHALEXIN (KEFLEX) 500 MG CAPSULE    Take 1 capsule (500 mg total) by mouth 3 (three) times daily.   ONDANSETRON (ZOFRAN ODT) 8 MG DISINTEGRATING TABLET    Take 1 tablet (8 mg total) by mouth every 8 (eight) hours as needed for nausea.   OXYCODONE-ACETAMINOPHEN (PERCOCET) 5-325 MG PER TABLET    1 to 2 tablets every 6 hours as needed for pain.   2.  The patient was instructed in symptomatic care and handouts were given. 3.  The patient was told to return if becoming worse in any way, with Dr. Tiburcio Pea in the next few weeks, and given some red flag symptoms that would indicate earlier return.    Reuben Likes, MD 12/10/12 [phone]

## 2012-12-11 ENCOUNTER — Other Ambulatory Visit: Payer: Self-pay | Admitting: Family Medicine

## 2012-12-11 DIAGNOSIS — M549 Dorsalgia, unspecified: Secondary | ICD-10-CM

## 2012-12-12 ENCOUNTER — Other Ambulatory Visit: Payer: Medicare Other

## 2012-12-12 ENCOUNTER — Ambulatory Visit
Admission: RE | Admit: 2012-12-12 | Discharge: 2012-12-12 | Disposition: A | Discharge status: Home / Self Care | Payer: Medicare Other | Source: Ambulatory Visit | Attending: Family Medicine | Admitting: Family Medicine

## 2012-12-12 DIAGNOSIS — M549 Dorsalgia, unspecified: Secondary | ICD-10-CM

## 2012-12-12 LAB — URINE CULTURE

## 2012-12-13 NOTE — ED Notes (Signed)
Urine culture: >100,000 colonies E. Coli. Pt. adequately treated with Keflex. [name] 12/13/2012

## 2012-12-16 ENCOUNTER — Emergency Department (HOSPITAL_COMMUNITY)
Admission: EM | Admit: 2012-12-16 | Discharge: 2012-12-16 | Disposition: A | Discharge status: Home / Self Care | Payer: Medicare Other | Attending: Emergency Medicine | Admitting: Emergency Medicine

## 2012-12-16 ENCOUNTER — Encounter (HOSPITAL_COMMUNITY): Payer: Self-pay | Admitting: *Deleted

## 2012-12-16 DIAGNOSIS — Y929 Unspecified place or not applicable: Secondary | ICD-10-CM | POA: Insufficient documentation

## 2012-12-16 DIAGNOSIS — I10 Essential (primary) hypertension: Secondary | ICD-10-CM | POA: Insufficient documentation

## 2012-12-16 DIAGNOSIS — E119 Type 2 diabetes mellitus without complications: Secondary | ICD-10-CM | POA: Insufficient documentation

## 2012-12-16 DIAGNOSIS — Y939 Activity, unspecified: Secondary | ICD-10-CM | POA: Insufficient documentation

## 2012-12-16 DIAGNOSIS — Z794 Long term (current) use of insulin: Secondary | ICD-10-CM | POA: Insufficient documentation

## 2012-12-16 DIAGNOSIS — R319 Hematuria, unspecified: Secondary | ICD-10-CM | POA: Insufficient documentation

## 2012-12-16 DIAGNOSIS — S22000A Wedge compression fracture of unspecified thoracic vertebra, initial encounter for closed fracture: Secondary | ICD-10-CM

## 2012-12-16 DIAGNOSIS — X58XXXA Exposure to other specified factors, initial encounter: Secondary | ICD-10-CM | POA: Insufficient documentation

## 2012-12-16 DIAGNOSIS — E7800 Pure hypercholesterolemia, unspecified: Secondary | ICD-10-CM | POA: Insufficient documentation

## 2012-12-16 DIAGNOSIS — S22009A Unspecified fracture of unspecified thoracic vertebra, initial encounter for closed fracture: Secondary | ICD-10-CM | POA: Insufficient documentation

## 2012-12-16 DIAGNOSIS — K5900 Constipation, unspecified: Secondary | ICD-10-CM | POA: Insufficient documentation

## 2012-12-16 DIAGNOSIS — Z79899 Other long term (current) drug therapy: Secondary | ICD-10-CM | POA: Insufficient documentation

## 2012-12-16 LAB — URINALYSIS, ROUTINE W REFLEX MICROSCOPIC
Bilirubin Urine: NEGATIVE
Glucose, UA: NEGATIVE mg/dL
Nitrite: NEGATIVE
Specific Gravity, Urine: 1.005 (ref 1.005–1.030)
pH: 6.5 (ref 5.0–8.0)

## 2012-12-16 MED ORDER — HYDROMORPHONE HCL PF 1 MG/ML IJ SOLN
1.0000 mg | Freq: Once | INTRAMUSCULAR | Status: DC
  Filled 2012-12-16: qty 1

## 2012-12-16 MED ORDER — OXYCODONE HCL 5 MG PO TABS: 5.0000 mg | ORAL_TABLET | ORAL | Status: DC | PRN

## 2012-12-16 MED ORDER — OXYCODONE-ACETAMINOPHEN 5-325 MG PO TABS
1.0000 | ORAL_TABLET | Freq: Once | ORAL | Status: AC
  Administered 2012-12-16: 1 via ORAL
  Filled 2012-12-16: qty 1

## 2012-12-16 MED ORDER — HYDROMORPHONE HCL PF 1 MG/ML IJ SOLN
1.0000 mg | Freq: Once | INTRAMUSCULAR | Status: AC
  Administered 2012-12-16: 1 mg via INTRAVENOUS

## 2012-12-16 MED ORDER — KETOROLAC TROMETHAMINE 30 MG/ML IJ SOLN
30.0000 mg | Freq: Once | INTRAMUSCULAR | Status: AC
  Administered 2012-12-16: 30 mg via INTRAVENOUS
  Filled 2012-12-16: qty 1

## 2012-12-16 NOTE — ED Notes (Signed)
Pt states she has had flank pain for 3 weeks and it is getting worse,  Has been told she has blood in her urine,  Was seen at urgent care a week ago

## 2012-12-16 NOTE — ED Notes (Signed)
[id number]<AV> Expected date:<BR> Expected time:<BR> Means of arrival:<BR> Comments:<BR>

## 2012-12-16 NOTE — ED Provider Notes (Signed)
History     CSN: [id number]  Arrival date & time 12/16/12  [id number]   First MD Initiated Contact with Patient 12/16/12 [phone]      Chief Complaint  Patient presents with  . Flank Pain  . Hematuria    (Consider location/radiation/quality/duration/timing/severity/associated sxs/prior treatment) The history is provided by the patient.   patient reports 3 weeks of mid back pain that has been worsening.  Her pain is been constant.  She denies recent falls or trauma.  She states she seen multiple physicians and no one has been able to get out.  She had an MRI of the thoracic spine done 5 days ago and she does not know the results of this.  She's concerned that there is possibly some blood in her urine today and think she needs to see a urologist.  No dysuria or urinary frequency.  No weakness of her upper lower extremities.  She denies use of anticoagulants.  She has had some constipation but this seems to be resolved with suppositories.  She's taking Percocet at home without improvement in her symptoms.  She is tearful and states she's tired of dealing with her pain and feels like it needs to be figured out.  Her pain is worsened by movement and twisting of her torso.  She denies rash.  No fevers or chills.  No nausea or vomiting.  No abdominal pain.  No chest pain shortness of breath.  Past Medical History  Diagnosis Date  . Diabetes mellitus   . Hypertension   . Hypercholesteremia     Past Surgical History  Procedure Date  . Eye surgery     History reviewed. No pertinent family history.  History  Substance Use Topics  . Smoking status: Never Smoker   . Smokeless tobacco: Never Used  . Alcohol Use: No    OB History    Grav Para Term Preterm Abortions TAB SAB Ect Mult Living                  Review of Systems  All other systems reviewed and are negative.    Allergies  Review of patient's allergies indicates no known allergies.  Home Medications   Current Outpatient Rx    Name  Route  Sig  Dispense  Refill  . VITAMIN C PO   Oral   Take 2 tablets by mouth daily.          Marland Kitchen VITAMIN D3 1000 UNITS PO CAPS   Oral   Take 2,000 Units by mouth daily.         . FUROSEMIDE 40 MG PO TABS   Oral   Take 40 mg by mouth daily.         . IBUPROFEN 800 MG PO TABS   Oral   Take 800 mg by mouth every 8 (eight) hours as needed. For pain         . INSULIN GLARGINE 100 UNIT/ML [place] SOLN   Subcutaneous   Inject 10 Units into the skin 2 (two) times daily.          Marland Kitchen METFORMIN HCL 1000 MG PO TABS   Oral   Take 1 tablet (1,000 mg total) by mouth 2 (two) times daily with a meal.   60 tablet   0   . ADULT MULTIVITAMIN W/MINERALS CH   Oral   Take 1 tablet by mouth daily.         Marland Kitchen SALMON PO   Oral  Take 1 capsule by mouth daily.         . OXYCODONE-ACETAMINOPHEN 5-325 MG PO TABS      1 to 2 tablets every 6 hours as needed for pain.   20 tablet   0   . PIOGLITAZONE HCL 15 MG PO TABS   Oral   Take 15 mg by mouth daily.         Marland Kitchen RAMIPRIL 10 MG PO CAPS   Oral   Take 10 mg by mouth daily.         Marland Kitchen SIMVASTATIN 40 MG PO TABS   Oral   Take 40 mg by mouth at bedtime.         . CYCLOBENZAPRINE HCL 5 MG PO TABS   Oral   Take 5 mg by mouth 3 (three) times daily as needed. For muscle spasms         . ONDANSETRON 8 MG PO TBDP   Oral   Take 1 tablet (8 mg total) by mouth every 8 (eight) hours as needed for nausea.   20 tablet   0   . OXYCODONE HCL 5 MG PO TABS   Oral   Take 1 tablet (5 mg total) by mouth every 4 (four) hours as needed for pain.   30 tablet   0     BP 155/73  Pulse 73  Temp 98.9 F (37.2 C) (Oral)  Resp 18  Ht 5\' 8"  (1.727 m)  Wt 203 lb (92.08 kg)  BMI 30.87 kg/m2  SpO2 100%  Physical Exam  Nursing note and vitals reviewed. Constitutional: She is oriented to person, place, and time. She appears well-developed and well-nourished. No distress.  HENT:  Head: Normocephalic and atraumatic.  Eyes: EOM are  normal.  Neck: Normal range of motion.  Cardiovascular: Normal rate, regular rhythm and normal heart sounds.   Pulmonary/Chest: Effort normal and breath sounds normal.  Abdominal: Soft. She exhibits no distension. There is no tenderness.  Musculoskeletal: Normal range of motion.       Thoracic tenderness without step-off.  5 out of 5 strength in major muscle groups of lower extremities  Neurological: She is alert and oriented to person, place, and time.  Skin: Skin is warm and dry.  Psychiatric: She has a normal mood and affect. Judgment normal.    ED Course  Procedures (including critical care time)  Labs Reviewed  URINALYSIS, ROUTINE W REFLEX MICROSCOPIC - Abnormal; Notable for the following:    APPearance CLOUDY (*)     All other components within normal limits  GLUCOSE, CAPILLARY  RAPID STREP SCREEN    MRI 12/11/12 MRI THORACIC SPINE WITHOUT CONTRAST  Technique: Multiplanar and multiecho pulse sequences of the  thoracic spine were obtained without intravenous contrast.  Comparison: 12/10/2012  Findings: There is no osseous abnormality at T7 or above or at T9  or below.  T8 shows a compression fracture with loss of height of 60%. There  is mild posterior bowing of the posterior-superior margin of the  vertebral body (2 mm) which indents the ventral subarachnoid space  but does not effect the cord.  There is an insignificant benign hemangioma within the T4 vertebral  body. There are bilateral pleural effusions layering dependently,  small in amount.  IMPRESSION:  Acute or subacute compression fracture at T8 with loss of height of  60%. Few millimeters of posterior bowing, narrowing the ventral  subarachnoid space but not resulting in cord compression. There is  no finding to  suggest that this is anything other than a benign  osteoporotic fracture. No other marrow space lesions or other  fractures are seen.   I personally reviewed the imaging tests through PACS system I  reviewed available ER/hospitalization records through the EMR   1. Thoracic compression fracture       MDM  8:18 AM The patient's pain is controlled.  Review the records demonstrate a thoracic compression fracture as the cause of her pain.  Home with PCP/orthopedic followup.  She may require vertebroplasty in the future for pain control.  Home with oxycodone.         Lyanne Co, MD 12/16/12 [phone]

## 2012-12-16 NOTE — ED Notes (Signed)
Pt did not require a strep throat,  This was put on wrong pt by this Clinical research associate,   Tobi Bastos RN

## 2012-12-21 ENCOUNTER — Encounter (HOSPITAL_COMMUNITY): Payer: Self-pay | Admitting: Pharmacy Technician

## 2012-12-21 ENCOUNTER — Other Ambulatory Visit: Payer: Self-pay | Admitting: Orthopedic Surgery

## 2012-12-26 ENCOUNTER — Ambulatory Visit (HOSPITAL_COMMUNITY)
Admission: RE | Admit: 2012-12-26 | Discharge: 2012-12-26 | Disposition: A | Discharge status: Home / Self Care | Payer: Medicare Other | Source: Ambulatory Visit | Attending: Orthopedic Surgery | Admitting: Orthopedic Surgery

## 2012-12-26 ENCOUNTER — Encounter (HOSPITAL_COMMUNITY): Payer: Self-pay

## 2012-12-26 ENCOUNTER — Encounter (HOSPITAL_COMMUNITY)
Admission: RE | Admit: 2012-12-26 | Discharge: 2012-12-26 | Disposition: A | Discharge status: Home / Self Care | Payer: Medicare Other | Source: Ambulatory Visit | Attending: Orthopedic Surgery | Admitting: Orthopedic Surgery

## 2012-12-26 LAB — URINALYSIS, ROUTINE W REFLEX MICROSCOPIC
Glucose, UA: NEGATIVE mg/dL
Protein, ur: NEGATIVE mg/dL
Specific Gravity, Urine: 1.009 (ref 1.005–1.030)
Urobilinogen, UA: 0.2 mg/dL (ref 0.0–1.0)

## 2012-12-26 LAB — URINE MICROSCOPIC-ADD ON

## 2012-12-26 LAB — CBC WITH DIFFERENTIAL/PLATELET
Basophils Absolute: 0.1 10*3/uL (ref 0.0–0.1)
Eosinophils Relative: 2 % (ref 0–5)
Lymphocytes Relative: 30 % (ref 12–46)
MCV: 89.6 fL (ref 78.0–100.0)
Neutro Abs: 5.9 10*3/uL (ref 1.7–7.7)
Neutrophils Relative %: 60 % (ref 43–77)
Platelets: 268 10*3/uL (ref 150–400)
RBC: 4.5 MIL/uL (ref 3.87–5.11)
RDW: 12.7 % (ref 11.5–15.5)
WBC: 9.9 10*3/uL (ref 4.0–10.5)

## 2012-12-26 LAB — COMPREHENSIVE METABOLIC PANEL
ALT: 16 U/L (ref 0–35)
AST: 16 U/L (ref 0–37)
Alkaline Phosphatase: 68 U/L (ref 39–117)
CO2: 29 mEq/L (ref 19–32)
Calcium: 9.7 mg/dL (ref 8.4–10.5)
GFR calc non Af Amer: 67 mL/min — ABNORMAL LOW (ref >90)
Potassium: 4.9 mEq/L (ref 3.5–5.1)
Sodium: 134 mEq/L — ABNORMAL LOW (ref 135–145)

## 2012-12-26 LAB — TYPE AND SCREEN
ABO/RH(D): A POS
Antibody Screen: NEGATIVE

## 2012-12-26 LAB — SURGICAL PCR SCREEN: MRSA, PCR: NEGATIVE

## 2012-12-26 LAB — APTT: aPTT: 42 seconds — ABNORMAL HIGH (ref 24–37)

## 2012-12-26 MED ORDER — CEFAZOLIN SODIUM-DEXTROSE 2-3 GM-% IV SOLR
2.0000 g | INTRAVENOUS | Status: AC
  Administered 2012-12-27: 2 g via INTRAVENOUS
  Filled 2012-12-26: qty 50

## 2012-12-26 NOTE — Pre-Procedure Instructions (Addendum)
CANDANCE BOHLMAN  12/26/2012   Your procedure is scheduled on:  12/27/12  Report to [id number] noon AM.  Call this number if you have problems the morning of surgery: [phone]   Remember:   Do not eat food or drink liquids after midnight.   Take these medicines the morning of surgery with A SIP OF WATER: pain med,   Do not wear jewelry, make-up or nail polish.  Do not wear lotions, powders, or perfumes. You may wear deodorant.  Do not shave 48 hours prior to surgery. Men may shave face and neck.  Do not bring valuables to the hospital.  Contacts, dentures or bridgework may not be worn into surgery.  Leave suitcase in the car. After surgery it may be brought to your room.  For patients admitted to the hospital, checkout time is 11:00 AM the day of  discharge.   Patients discharged the day of surgery will not be allowed to drive  home.  Name and phone number of your driver:[phone]  Special Instructions: Incentive Spirometry - Practice and bring it with you on the day of surgery. Shower using CHG 2 nights before surgery and the night before surgery.  If you shower the day of surgery use CHG.  Use special wash - you have one bottle of CHG for all showers.  You should use approximately 1/3 of the bottle for each shower.   Please read over the following fact sheets that you were given: Pain Booklet, Coughing and Deep Breathing, Blood Transfusion Information, MRSA Information and Surgical Site Infection Prevention

## 2012-12-27 ENCOUNTER — Observation Stay (HOSPITAL_COMMUNITY)
Admission: RE | Admit: 2012-12-27 | Discharge: 2012-12-28 | Disposition: A | Discharge status: Home / Self Care | Payer: Medicare Other | Source: Ambulatory Visit | Attending: Orthopedic Surgery | Admitting: Orthopedic Surgery

## 2012-12-27 ENCOUNTER — Ambulatory Visit (HOSPITAL_COMMUNITY): Payer: Medicare Other | Admitting: Anesthesiology

## 2012-12-27 ENCOUNTER — Encounter (HOSPITAL_COMMUNITY): Payer: Self-pay | Admitting: Anesthesiology

## 2012-12-27 ENCOUNTER — Encounter (HOSPITAL_COMMUNITY)
Admission: RE | Disposition: A | Discharge status: Home / Self Care | Payer: Self-pay | Source: Ambulatory Visit | Attending: Orthopedic Surgery

## 2012-12-27 ENCOUNTER — Ambulatory Visit (HOSPITAL_COMMUNITY): Payer: Medicare Other

## 2012-12-27 DIAGNOSIS — E7800 Pure hypercholesterolemia, unspecified: Secondary | ICD-10-CM | POA: Insufficient documentation

## 2012-12-27 DIAGNOSIS — E119 Type 2 diabetes mellitus without complications: Secondary | ICD-10-CM | POA: Insufficient documentation

## 2012-12-27 DIAGNOSIS — Z01810 Encounter for preprocedural cardiovascular examination: Secondary | ICD-10-CM | POA: Insufficient documentation

## 2012-12-27 DIAGNOSIS — IMO0002 Reserved for concepts with insufficient information to code with codable children: Secondary | ICD-10-CM

## 2012-12-27 DIAGNOSIS — I10 Essential (primary) hypertension: Secondary | ICD-10-CM | POA: Insufficient documentation

## 2012-12-27 DIAGNOSIS — S22009A Unspecified fracture of unspecified thoracic vertebra, initial encounter for closed fracture: Principal | ICD-10-CM | POA: Insufficient documentation

## 2012-12-27 DIAGNOSIS — Z01818 Encounter for other preprocedural examination: Secondary | ICD-10-CM | POA: Insufficient documentation

## 2012-12-27 DIAGNOSIS — Z01812 Encounter for preprocedural laboratory examination: Secondary | ICD-10-CM | POA: Insufficient documentation

## 2012-12-27 DIAGNOSIS — X58XXXA Exposure to other specified factors, initial encounter: Secondary | ICD-10-CM | POA: Insufficient documentation

## 2012-12-27 HISTORY — PX: KYPHOPLASTY: SHX5884

## 2012-12-27 LAB — GLUCOSE, CAPILLARY
Glucose-Capillary: 111 mg/dL — ABNORMAL HIGH (ref 70–99)
Glucose-Capillary: 93 mg/dL (ref 70–99)
Glucose-Capillary: 122 mg/dL — ABNORMAL HIGH (ref 70–99)

## 2012-12-27 SURGERY — KYPHOPLASTY
Anesthesia: General | Site: Spine Thoracic | Laterality: Bilateral | Wound class: Clean

## 2012-12-27 MED ORDER — IOHEXOL 300 MG/ML  SOLN
INTRAMUSCULAR | Status: DC | PRN
  Administered 2012-12-27: 40 mL

## 2012-12-27 MED ORDER — ZOLPIDEM TARTRATE 5 MG PO TABS: 5.0000 mg | ORAL_TABLET | Freq: Every evening | ORAL | Status: DC | PRN

## 2012-12-27 MED ORDER — ROCURONIUM BROMIDE 100 MG/10ML IV SOLN
INTRAVENOUS | Status: DC | PRN
  Administered 2012-12-27: 40 mg via INTRAVENOUS

## 2012-12-27 MED ORDER — HYDROCODONE-ACETAMINOPHEN 5-325 MG PO TABS: 1.0000 | ORAL_TABLET | ORAL | Status: DC | PRN

## 2012-12-27 MED ORDER — ONDANSETRON HCL 4 MG/2ML IJ SOLN: 4.0000 mg | Freq: Once | INTRAMUSCULAR | Status: DC

## 2012-12-27 MED ORDER — PHENOL 1.4 % MT LIQD: 1.0000 | OROMUCOSAL | Status: DC | PRN

## 2012-12-27 MED ORDER — DIAZEPAM 5 MG PO TABS: 5.0000 mg | ORAL_TABLET | Freq: Four times a day (QID) | ORAL | Status: DC | PRN

## 2012-12-27 MED ORDER — BUPIVACAINE-EPINEPHRINE PF 0.25-1:200000 % IJ SOLN
INTRAMUSCULAR | Status: AC
  Filled 2012-12-27: qty 30

## 2012-12-27 MED ORDER — 0.9 % SODIUM CHLORIDE (POUR BTL) OPTIME
TOPICAL | Status: DC | PRN
  Administered 2012-12-27: 1000 mL

## 2012-12-27 MED ORDER — ONDANSETRON HCL 4 MG/2ML IJ SOLN
INTRAMUSCULAR | Status: DC | PRN
  Administered 2012-12-27: 4 mg via INTRAVENOUS

## 2012-12-27 MED ORDER — MORPHINE SULFATE 2 MG/ML IJ SOLN: 1.0000 mg | INTRAMUSCULAR | Status: DC | PRN

## 2012-12-27 MED ORDER — PROMETHAZINE HCL 25 MG/ML IJ SOLN: 6.2500 mg | INTRAMUSCULAR | Status: DC | PRN

## 2012-12-27 MED ORDER — SODIUM CHLORIDE 0.9 % IJ SOLN: 3.0000 mL | INTRAMUSCULAR | Status: DC | PRN

## 2012-12-27 MED ORDER — ACETAMINOPHEN 650 MG RE SUPP: 650.0000 mg | RECTAL | Status: DC | PRN

## 2012-12-27 MED ORDER — OXYCODONE-ACETAMINOPHEN 5-325 MG PO TABS
1.0000 | ORAL_TABLET | ORAL | Status: DC | PRN
  Administered 2012-12-27: 1 via ORAL
  Administered 2012-12-28: 2 via ORAL
  Filled 2012-12-27: qty 2

## 2012-12-27 MED ORDER — OXYCODONE HCL 5 MG PO TABS: 5.0000 mg | ORAL_TABLET | Freq: Once | ORAL | Status: DC | PRN

## 2012-12-27 MED ORDER — GLYCOPYRROLATE 0.2 MG/ML IJ SOLN
INTRAMUSCULAR | Status: DC | PRN
  Administered 2012-12-27: .8 mg via INTRAVENOUS

## 2012-12-27 MED ORDER — MIDAZOLAM HCL 2 MG/2ML IJ SOLN: 1.0000 mg | INTRAMUSCULAR | Status: DC | PRN

## 2012-12-27 MED ORDER — POVIDONE-IODINE 7.5 % EX SOLN
Freq: Once | CUTANEOUS | Status: DC
  Filled 2012-12-27: qty 118

## 2012-12-27 MED ORDER — FENTANYL CITRATE 0.05 MG/ML IJ SOLN
INTRAMUSCULAR | Status: DC | PRN
  Administered 2012-12-27: 50 ug via INTRAVENOUS
  Administered 2012-12-27: 100 ug via INTRAVENOUS
  Administered 2012-12-27: 50 ug via INTRAVENOUS

## 2012-12-27 MED ORDER — ONDANSETRON HCL 4 MG PO TABS: 4.0000 mg | ORAL_TABLET | Freq: Four times a day (QID) | ORAL | Status: DC | PRN

## 2012-12-27 MED ORDER — MENTHOL 3 MG MT LOZG
1.0000 | LOZENGE | OROMUCOSAL | Status: DC | PRN
  Filled 2012-12-27: qty 9

## 2012-12-27 MED ORDER — SODIUM CHLORIDE 0.9 % IJ SOLN: 3.0000 mL | Freq: Two times a day (BID) | INTRAMUSCULAR | Status: DC

## 2012-12-27 MED ORDER — CEFAZOLIN SODIUM 1-5 GM-% IV SOLN: 1.0000 g | Freq: Three times a day (TID) | INTRAVENOUS | Status: DC

## 2012-12-27 MED ORDER — DEXTROSE 50 % IV SOLN
INTRAVENOUS | Status: AC
  Administered 2012-12-27: 12.5 mL via INTRAVENOUS
  Filled 2012-12-27: qty 50

## 2012-12-27 MED ORDER — ONDANSETRON HCL 4 MG/2ML IJ SOLN
INTRAMUSCULAR | Status: AC
  Administered 2012-12-27: 4 mg via INTRAVENOUS
  Filled 2012-12-27: qty 2

## 2012-12-27 MED ORDER — FENTANYL CITRATE 0.05 MG/ML IJ SOLN: 50.0000 ug | Freq: Once | INTRAMUSCULAR | Status: DC

## 2012-12-27 MED ORDER — HYDROMORPHONE HCL PF 1 MG/ML IJ SOLN: 0.2500 mg | INTRAMUSCULAR | Status: DC | PRN

## 2012-12-27 MED ORDER — OXYCODONE HCL 5 MG/5ML PO SOLN: 5.0000 mg | Freq: Once | ORAL | Status: DC | PRN

## 2012-12-27 MED ORDER — ACETAMINOPHEN 325 MG PO TABS: 650.0000 mg | ORAL_TABLET | ORAL | Status: DC | PRN

## 2012-12-27 MED ORDER — ONDANSETRON HCL 4 MG/2ML IJ SOLN
4.0000 mg | INTRAMUSCULAR | Status: DC | PRN
  Administered 2012-12-27: 4 mg via INTRAVENOUS

## 2012-12-27 MED ORDER — PROPOFOL 10 MG/ML IV BOLUS
INTRAVENOUS | Status: DC | PRN
  Administered 2012-12-27: 160 mg via INTRAVENOUS

## 2012-12-27 MED ORDER — SODIUM CHLORIDE 0.9 % IV SOLN: 250.0000 mL | INTRAVENOUS | Status: DC

## 2012-12-27 MED ORDER — ARTIFICIAL TEARS OP OINT
TOPICAL_OINTMENT | OPHTHALMIC | Status: DC | PRN
  Administered 2012-12-27: 1 via OPHTHALMIC

## 2012-12-27 MED ORDER — LACTATED RINGERS IV SOLN
INTRAVENOUS | Status: DC | PRN
  Administered 2012-12-27 (×2): via INTRAVENOUS

## 2012-12-27 MED ORDER — DEXTROSE 50 % IV SOLN
12.5000 mL | Freq: Once | INTRAVENOUS | Status: AC
  Administered 2012-12-27 (×2): 12.5 mL via INTRAVENOUS

## 2012-12-27 MED ORDER — ALUM & MAG HYDROXIDE-SIMETH 200-200-20 MG/5ML PO SUSP: 30.0000 mL | Freq: Four times a day (QID) | ORAL | Status: DC | PRN

## 2012-12-27 MED ORDER — LABETALOL HCL 5 MG/ML IV SOLN
INTRAVENOUS | Status: DC | PRN
  Administered 2012-12-27: 2.5 mg via INTRAVENOUS
  Administered 2012-12-27: 5 mg via INTRAVENOUS

## 2012-12-27 MED ORDER — LIDOCAINE HCL (CARDIAC) 20 MG/ML IV SOLN
INTRAVENOUS | Status: DC | PRN
  Administered 2012-12-27: 80 mg via INTRAVENOUS

## 2012-12-27 MED ORDER — BUPIVACAINE-EPINEPHRINE 0.25% -1:200000 IJ SOLN
INTRAMUSCULAR | Status: DC | PRN
  Administered 2012-12-27: 1 mL

## 2012-12-27 MED ORDER — OXYCODONE-ACETAMINOPHEN 5-325 MG PO TABS
ORAL_TABLET | ORAL | Status: AC
  Administered 2012-12-27: 1 via ORAL
  Filled 2012-12-27: qty 1

## 2012-12-27 SURGICAL SUPPLY — 38 items
BANDAGE ADHESIVE 1X3 (GAUZE/BANDAGES/DRESSINGS) ×4 IMPLANT
BLADE SURG 15 STRL LF DISP TIS (BLADE) ×1 IMPLANT
BLADE SURG 15 STRL SS (BLADE) ×1
CEMENT BONE KYPHX HV R (Orthopedic Implant) ×2 IMPLANT
CEMENT KYPHON C01A KIT/MIXER (Cement) ×2 IMPLANT
CLOTH BEACON ORANGE TIMEOUT ST (SAFETY) ×2 IMPLANT
COVER MAYO STAND STRL (DRAPES) ×2 IMPLANT
CURETTE WEDGE 8.5MM KYPHX (MISCELLANEOUS) IMPLANT
DRAPE C-ARM 42X72 X-RAY (DRAPES) ×2 IMPLANT
DRAPE INCISE IOBAN 66X45 STRL (DRAPES) ×2 IMPLANT
DRAPE LAPAROTOMY T 102X78X121 (DRAPES) ×2 IMPLANT
DRAPE PROXIMA HALF (DRAPES) ×4 IMPLANT
DRAPE SURG 17X23 STRL (DRAPES) ×12 IMPLANT
DURAPREP 26ML APPLICATOR (WOUND CARE) ×2 IMPLANT
GAUZE SPONGE 4X4 16PLY XRAY LF (GAUZE/BANDAGES/DRESSINGS) ×2 IMPLANT
GLOVE BIO SURGEON STRL SZ7 (GLOVE) ×2 IMPLANT
GLOVE BIO SURGEON STRL SZ8 (GLOVE) ×2 IMPLANT
GLOVE BIOGEL PI IND STRL 7.5 (GLOVE) ×1 IMPLANT
GLOVE BIOGEL PI IND STRL 8 (GLOVE) ×1 IMPLANT
GLOVE BIOGEL PI INDICATOR 7.5 (GLOVE) ×1
GLOVE BIOGEL PI INDICATOR 8 (GLOVE) ×1
GOWN PREVENTION PLUS XLARGE (GOWN DISPOSABLE) ×4 IMPLANT
GOWN STRL NON-REIN LRG LVL3 (GOWN DISPOSABLE) ×2 IMPLANT
KIT BASIN OR (CUSTOM PROCEDURE TRAY) ×2 IMPLANT
KIT POSITION SURG JACKSON T1 (MISCELLANEOUS) IMPLANT
KIT ROOM TURNOVER OR (KITS) ×2 IMPLANT
NEEDLE 27GAX1X1/2 (NEEDLE) ×2 IMPLANT
NEEDLE HYPO 25X1 1.5 SAFETY (NEEDLE) ×2 IMPLANT
NEEDLE SPNL 18GX3.5 QUINCKE PK (NEEDLE) ×4 IMPLANT
NS IRRIG 1000ML POUR BTL (IV SOLUTION) ×2 IMPLANT
PACK SURGICAL SETUP 50X90 (CUSTOM PROCEDURE TRAY) ×2 IMPLANT
PAD ARMBOARD 7.5X6 YLW CONV (MISCELLANEOUS) ×4 IMPLANT
SUT MNCRL AB 4-0 PS2 18 (SUTURE) ×2 IMPLANT
SYR CONTROL 10ML LL (SYRINGE) ×2 IMPLANT
TOWEL OR 17X24 6PK STRL BLUE (TOWEL DISPOSABLE) ×2 IMPLANT
TOWEL OR 17X26 10 PK STRL BLUE (TOWEL DISPOSABLE) ×2 IMPLANT
TRAY KYPHOPAK 15/3 ONESTEP 1ST (MISCELLANEOUS) ×2 IMPLANT
WATER STERILE IRR 1000ML POUR (IV SOLUTION) IMPLANT

## 2012-12-27 NOTE — Anesthesia Procedure Notes (Signed)
Procedure Name: Intubation Date/Time: 12/27/2012 4:45 PM Performed by: Lovie Chol Pre-anesthesia Checklist: Patient identified, Emergency Drugs available, Suction available, Patient being monitored and Timeout performed Patient Re-evaluated:Patient Re-evaluated prior to inductionOxygen Delivery Method: Circle system utilized Preoxygenation: Pre-oxygenation with 100% oxygen Intubation Type: IV induction Ventilation: Mask ventilation without difficulty Laryngoscope Size: Miller and 2 Grade View: Grade I Tube type: Oral Tube size: 7.5 mm Number of attempts: 1 Airway Equipment and Method: Stylet Placement Confirmation: ETT inserted through vocal cords under direct vision,  positive ETCO2,  CO2 detector and breath sounds checked- equal and bilateral Secured at: 20 cm Tube secured with: Tape Dental Injury: Teeth and Oropharynx as per pre-operative assessment

## 2012-12-27 NOTE — Anesthesia Postprocedure Evaluation (Signed)
  Anesthesia Post-op Note  Patient: [name]  Procedure(s) Performed: Procedure(s) (LRB) with comments: KYPHOPLASTY (Bilateral) - T-8 kyphoplasty  Patient Location: PACU  Anesthesia Type:General  Level of Consciousness: awake, alert  and oriented  Airway and Oxygen Therapy: Patient Spontanous Breathing  Post-op Pain: mild  Post-op Assessment: Post-op Vital signs reviewed  Post-op Vital Signs: Reviewed  Complications: No apparent anesthesia complications

## 2012-12-27 NOTE — Progress Notes (Signed)
Will admit pt for 23hr obs.  Admitting called for a bed on 5N

## 2012-12-27 NOTE — Preoperative (Signed)
Beta Blockers   Reason not to administer Beta Blockers:Not Applicable 

## 2012-12-27 NOTE — Op Note (Signed)
NAMESHEILIA, Cooke NO.:  [id number]  MEDICAL RECORD NO.:  [id number]  LOCATION:  [id number]                        FACILITY:  [facility]  PHYSICIAN:  Estill Bamberg, MD      DATE OF BIRTH:  1946-11-04  DATE OF PROCEDURE:  12/27/2012                              OPERATIVE REPORT   PREOPERATIVE DIAGNOSIS:  T8 thoracic compression fracture.  POSTOPERATIVE DIAGNOSIS:  T8 thoracic compression fracture.  PROCEDURE: 1. T8 kyphoplasty. 2. Intraoperative use of fluoroscopy. 3. Biopsy from the T8 vertebral body.  SURGEON:  Estill Bamberg, MD  ASSISTANT:  Eilene Ghazi. McKenzie, PA-C  ANESTHESIA:  General endotracheal anesthesia.  COMPLICATIONS:  None.  DISPOSITION:  Stable.  ESTIMATED BLOOD LOSS:  Minimal.  INDICATIONS FOR PROCEDURE:  Briefly, [name] is a pleasant 67 year old female who did present to my office with severe debilitating pain at the mid aspect of her back about 5 weeks ago.  She rated her pain at 10/10.  Of note, interestingly, the patient was unaware of any trauma. She states that she simply was constipated about 5 weeks ago, and was straining on the toilet and noted pain in her back.  An MRI was obtained and did not suggest a pathologic fracture.  However, she clearly did have what did appear to be an acute T8 compression fracture.  Given the patient's debilitating pain, we did have a discussion regarding going forward with a kyphoplasty at the T8 level.  The patient fully understood the risks and limitations of the procedure.  Of particular note, the patient did understand that any pain related to compression fracture would be alleviated from the surgery.  The pain would not be alleviated.  OPERATIVE DETAILS:  On December 27, 2012, the patient was brought to surgery and general endotracheal anesthesia was administered.  The patient was placed prone on a well-padded flat Jackson bed.  Gel rolls were placed on to the patient's chest and hips.  I then  brought in AP and lateral fluoroscopy.  I then marked out the region of the pedicles of T8.  I then advanced the Jamshidi across the pedicle on the right and on the left sides using AP and lateral fluoroscopy.  I did ensure that there was no cortical breach of the pedicle.  I then advanced the drill across the cannulas on the right and the left sides.  Of note, I did send the vertebral body off for biopsy.  I inserted the kyphoplasty balloons at both levels and did inflate the balloons.  Of note, there is very little resistance identified, and it was clear that the vertebral body bone marrow was extremely soft.  I then introduced approximately 0.5 mL of cement through both the right and the left sides.  I did note excellent interdigitation on the right and the left sides and there was excellent fill noted on both the right and left sides.  The cement did cross the midline.  There was no extravasation of cement posteriorly into the spinal canal or into the region of the neural foramen above or below.  I then waited an additional 15 minutes for the cement to harden and the cannulas were removed.  I  was very pleased with the radiographic appearance on both AP and lateral radiographs.  Of note, I did note excellent restoration of the significant height loss anteriorly.  I then closed the portals using Monocryl and Band-Aids were then placed.  The patient was then awoken from general endotracheal anesthesia and transferred to recovery in stable condition.  Jason Coop was my assistant throughout the procedure and aided in suctioning and placement of the trocars.    Estill Bamberg, MD     MD/MEDQ  D:  12/27/2012  T:  12/27/2012  Job:  [id number]  cc:   Holley Bouche, M.D.

## 2012-12-27 NOTE — Progress Notes (Signed)
Pt arrived from PACU and transferred to reclining chair.  States she is nauseated.  Pt started vomiting and c/o 9 out of 10 pain.  NS started at [facility] and Zofran given IV.  Dr. Yevette Edwards notified.  Pt very reluctant to go home.

## 2012-12-27 NOTE — Transfer of Care (Signed)
Immediate Anesthesia Transfer of Care Note  Patient: [name]  Procedure(s) Performed: Procedure(s) (LRB) with comments: KYPHOPLASTY (Bilateral) - T-8 kyphoplasty  Patient Location: PACU  Anesthesia Type:General  Level of Consciousness: awake, oriented and patient cooperative  Airway & Oxygen Therapy: Patient Spontanous Breathing and Patient connected to face mask oxygen  Post-op Assessment: Report given to PACU RN and Patient moving all extremities X 4  Post vital signs: Reviewed and stable  Complications: No apparent anesthesia complications

## 2012-12-27 NOTE — Anesthesia Preprocedure Evaluation (Addendum)
Anesthesia Evaluation  Patient identified by MRN, date of birth, ID band Patient awake    Reviewed: Allergy & Precautions, H&P , NPO status , Patient's Chart, lab work & pertinent test results  History of Anesthesia Complications Negative for: history of anesthetic complications  Airway Mallampati: I TM Distance: >3 FB Neck ROM: Full    Dental  (+) Partial Lower, Upper Dentures, Edentulous Upper and Dental Advisory Given   Pulmonary neg pulmonary ROS,  *RADIOLOGY REPORT*  12-26-12 Clinical Data: T8 compression fracture.  Preoperative respiratory exam.   CHEST - 2 VIEW   Comparison: 12/10/2012   Findings: The heart size and pulmonary vascularity are normal. Tortuosity of the thoracic aorta.   The severity of the T8 compression fracture has increased since 12/10/2012.  Lungs are hyperinflated with flattening of the diaphragm consistent with emphysema.  Slight scarring at the lung bases.   There are multiple small nodular appearing artifacts from the patient's clothing.   IMPRESSION: Further compression of the T8 vertebral body.   Emphysema.  breath sounds clear to auscultation  Pulmonary exam normal       Cardiovascular hypertension, Pt. on medications Rhythm:Regular Rate:Normal  26-Dec-2012 13:44:19 [facility] ROUTINE RECORD Normal sinus rhythm Normal ECG   Neuro/Psych  Headaches, Anxiety    GI/Hepatic   Endo/Other  diabetes, Type 2, Insulin Dependent and Oral Hypoglycemic AgentsGlucose 93 @ 3:30 after 1/2 amp D50%  Pt feels good. Denies taking any PO diabetic med or insulin in the last 24 hrs.  Renal/GU      Musculoskeletal  (+) Arthritis -, Osteoarthritis,    Abdominal   Peds  Hematology   Anesthesia Other Findings   Reproductive/Obstetrics                       Anesthesia Physical Anesthesia Plan  ASA: III  Anesthesia Plan: General   Post-op Pain  Management:    Induction: Intravenous  Airway Management Planned: Oral ETT  Additional Equipment:   Intra-op Plan:   Post-operative Plan: Extubation in OR  Informed Consent: I have reviewed the patients History and Physical, chart, labs and discussed the procedure including the risks, benefits and alternatives for the proposed anesthesia with the patient or authorized representative who has indicated his/her understanding and acceptance.     Plan Discussed with: CRNA and Surgeon  Anesthesia Plan Comments:         Anesthesia Quick Evaluation

## 2012-12-27 NOTE — H&P (Signed)
PREOPERATIVE H&P  Chief Complaint: mid back pain  HPI: [name] is a 67 y.o. female who presentswith mid-back pain  Past Medical History  Diagnosis Date  . Diabetes mellitus   . Hypertension   . Hypercholesteremia    Past Surgical History  Procedure Date  . Eye surgery     laser , cat bil  . Tubal ligation    History   Social History  . Marital Status: Married    Spouse Name: N/A    Number of Children: N/A  . Years of Education: N/A   Social History Main Topics  . Smoking status: Never Smoker   . Smokeless tobacco: Never Used  . Alcohol Use: No  . Drug Use: No  . Sexually Active: Yes   Other Topics Concern  . Not on file   Social History Narrative  . No narrative on file   No family history on file. No Known Allergies Prior to Admission medications   Medication Sig Start Date End Date Taking? Authorizing Provider  Ascorbic Acid (VITAMIN C PO) Take 2 tablets by mouth daily.    Yes Historical Provider, MD  Cholecalciferol (VITAMIN D3) 1000 UNITS CAPS Take 2,000 Units by mouth daily.   Yes Historical Provider, MD  cyclobenzaprine (FLEXERIL) 5 MG tablet Take 5 mg by mouth 3 (three) times daily as needed. For muscle spasms   Yes Historical Provider, MD  furosemide (LASIX) 40 MG tablet Take 40 mg by mouth daily.   Yes Historical Provider, MD  ibuprofen (ADVIL,MOTRIN) 800 MG tablet Take 800 mg by mouth every 8 (eight) hours as needed. For pain   Yes Historical Provider, MD  insulin glargine (LANTUS) 100 UNIT/ML injection Inject 10 Units into the skin 2 (two) times daily.  04/04/12 04/04/13 Yes Shanker Levora Dredge, MD  metFORMIN (GLUCOPHAGE) 1000 MG tablet Take 1 tablet (1,000 mg total) by mouth 2 (two) times daily with a meal. 04/04/12 04/04/13 Yes Shanker Levora Dredge, MD  Multiple Vitamin (MULITIVITAMIN WITH MINERALS) TABS Take 1 tablet by mouth daily.   Yes Historical Provider, MD  Omega-3 Fatty Acids (SALMON PO) Take 1 capsule by mouth daily.   Yes Historical Provider, MD   ondansetron (ZOFRAN ODT) 8 MG disintegrating tablet Take 1 tablet (8 mg total) by mouth every 8 (eight) hours as needed for nausea. 12/10/12  Yes Reuben Likes, MD  oxyCODONE (ROXICODONE) 5 MG immediate release tablet Take 1 tablet (5 mg total) by mouth every 4 (four) hours as needed for pain. 12/16/12  Yes Lyanne Co, MD  oxyCODONE-acetaminophen (PERCOCET) 5-325 MG per tablet 1 to 2 tablets every 6 hours as needed for pain. 12/10/12  Yes Reuben Likes, MD  pioglitazone (ACTOS) 15 MG tablet Take 30 mg by mouth daily.    Yes Historical Provider, MD  ramipril (ALTACE) 10 MG capsule Take 10 mg by mouth daily.   Yes Historical Provider, MD  simvastatin (ZOCOR) 40 MG tablet Take 40 mg by mouth at bedtime.   Yes Historical Provider, MD  OVER THE COUNTER MEDICATION Take 30 mLs by mouth daily. Mineral Rich.    Historical Provider, MD     All other systems have been reviewed and were otherwise negative with the exception of those mentioned in the HPI and as above.  Physical Exam: There were no vitals filed for this visit.  General: Alert, no acute distress Cardiovascular: No pedal edema Respiratory: No cyanosis, no use of accessory musculature GI: No organomegaly, abdomen is soft and  non-tender Skin: No lesions in the area of chief complaint Neurologic: Sensation intact distally Psychiatric: Patient is competent for consent with normal mood and affect Lymphatic: No axillary or cervical lymphadenopathy  MUSCULOSKELETAL: + TTP at T8 level  Assessment/Plan: T-8 kyphoplasty Plan for Procedure(s): KYPHOPLASTY   Emilee Hero, MD 12/27/2012 6:37 AM

## 2012-12-27 NOTE — Progress Notes (Signed)
Pt tolerated half of a [place] sandwhich and kept down a Percocet.  She states she is feeling much better and her pain is down to a seven.  She states she wants to stay overnight with professionals and is not comfortable going home.  Dr. Reva Bores notified.

## 2012-12-28 ENCOUNTER — Encounter (HOSPITAL_COMMUNITY): Payer: Self-pay | Admitting: Orthopedic Surgery

## 2012-12-28 NOTE — Progress Notes (Signed)
Patient admitted overnight, as she stated that she was not comfortable going home.  BP 166/65  Pulse 79  Temp 98 F (36.7 C) (Oral)  Resp 19  SpO2 98%  NVI Pain well comtrolled  S/p T8 kyphoplasty  - d/c home today - f/u 2 weeks

## 2012-12-28 NOTE — Progress Notes (Signed)
Patient discharged this morning via MD order. Completed discharge paper work with patient and husband. All questions answered. Patient then discharged via wheelchair with volunteer services to private vehicle.

## 2012-12-28 NOTE — Progress Notes (Signed)
Utilization review completed. Lisandro Meggett, RN, BSN. 

## 2012-12-29 HISTORY — PX: CORONARY ANGIOPLASTY: SHX604

## 2013-01-04 ENCOUNTER — Encounter (HOSPITAL_COMMUNITY): Payer: Self-pay | Admitting: Emergency Medicine

## 2013-01-04 ENCOUNTER — Emergency Department (HOSPITAL_COMMUNITY): Payer: Medicare Other

## 2013-01-04 ENCOUNTER — Inpatient Hospital Stay (HOSPITAL_COMMUNITY)
Admission: EM | Admit: 2013-01-04 | Discharge: 2013-01-17 | DRG: 981 | Disposition: A | Discharge status: Home Health | Payer: Medicare Other | Attending: Internal Medicine | Admitting: Internal Medicine

## 2013-01-04 DIAGNOSIS — E7800 Pure hypercholesterolemia, unspecified: Secondary | ICD-10-CM | POA: Diagnosis present

## 2013-01-04 DIAGNOSIS — R197 Diarrhea, unspecified: Secondary | ICD-10-CM | POA: Diagnosis present

## 2013-01-04 DIAGNOSIS — Z79899 Other long term (current) drug therapy: Secondary | ICD-10-CM

## 2013-01-04 DIAGNOSIS — K6389 Other specified diseases of intestine: Secondary | ICD-10-CM | POA: Diagnosis present

## 2013-01-04 DIAGNOSIS — Z794 Long term (current) use of insulin: Secondary | ICD-10-CM

## 2013-01-04 DIAGNOSIS — N179 Acute kidney failure, unspecified: Secondary | ICD-10-CM | POA: Diagnosis not present

## 2013-01-04 DIAGNOSIS — E785 Hyperlipidemia, unspecified: Secondary | ICD-10-CM | POA: Diagnosis present

## 2013-01-04 DIAGNOSIS — I2510 Atherosclerotic heart disease of native coronary artery without angina pectoris: Secondary | ICD-10-CM | POA: Diagnosis present

## 2013-01-04 DIAGNOSIS — R509 Fever, unspecified: Secondary | ICD-10-CM

## 2013-01-04 DIAGNOSIS — Y840 Cardiac catheterization as the cause of abnormal reaction of the patient, or of later complication, without mention of misadventure at the time of the procedure: Secondary | ICD-10-CM | POA: Diagnosis not present

## 2013-01-04 DIAGNOSIS — I471 Supraventricular tachycardia, unspecified: Secondary | ICD-10-CM | POA: Diagnosis not present

## 2013-01-04 DIAGNOSIS — A498 Other bacterial infections of unspecified site: Secondary | ICD-10-CM | POA: Diagnosis present

## 2013-01-04 DIAGNOSIS — N390 Urinary tract infection, site not specified: Secondary | ICD-10-CM

## 2013-01-04 DIAGNOSIS — E876 Hypokalemia: Secondary | ICD-10-CM | POA: Diagnosis present

## 2013-01-04 DIAGNOSIS — Z9889 Other specified postprocedural states: Secondary | ICD-10-CM

## 2013-01-04 DIAGNOSIS — E860 Dehydration: Secondary | ICD-10-CM

## 2013-01-04 DIAGNOSIS — K626 Ulcer of anus and rectum: Secondary | ICD-10-CM | POA: Diagnosis present

## 2013-01-04 DIAGNOSIS — I959 Hypotension, unspecified: Secondary | ICD-10-CM | POA: Diagnosis not present

## 2013-01-04 DIAGNOSIS — E871 Hypo-osmolality and hyponatremia: Secondary | ICD-10-CM | POA: Diagnosis present

## 2013-01-04 DIAGNOSIS — Z7902 Long term (current) use of antithrombotics/antiplatelets: Secondary | ICD-10-CM

## 2013-01-04 DIAGNOSIS — K5730 Diverticulosis of large intestine without perforation or abscess without bleeding: Secondary | ICD-10-CM | POA: Diagnosis present

## 2013-01-04 DIAGNOSIS — I2542 Coronary artery dissection: Secondary | ICD-10-CM | POA: Diagnosis not present

## 2013-01-04 DIAGNOSIS — B370 Candidal stomatitis: Secondary | ICD-10-CM | POA: Diagnosis not present

## 2013-01-04 DIAGNOSIS — Y9210 Unspecified residential institution as the place of occurrence of the external cause: Secondary | ICD-10-CM | POA: Diagnosis not present

## 2013-01-04 DIAGNOSIS — Z7982 Long term (current) use of aspirin: Secondary | ICD-10-CM

## 2013-01-04 DIAGNOSIS — K921 Melena: Secondary | ICD-10-CM | POA: Diagnosis present

## 2013-01-04 DIAGNOSIS — I519 Heart disease, unspecified: Secondary | ICD-10-CM | POA: Diagnosis not present

## 2013-01-04 DIAGNOSIS — L02619 Cutaneous abscess of unspecified foot: Secondary | ICD-10-CM

## 2013-01-04 DIAGNOSIS — I498 Other specified cardiac arrhythmias: Secondary | ICD-10-CM | POA: Diagnosis not present

## 2013-01-04 DIAGNOSIS — I10 Essential (primary) hypertension: Secondary | ICD-10-CM | POA: Diagnosis present

## 2013-01-04 DIAGNOSIS — I214 Non-ST elevation (NSTEMI) myocardial infarction: Secondary | ICD-10-CM | POA: Diagnosis not present

## 2013-01-04 DIAGNOSIS — R5381 Other malaise: Secondary | ICD-10-CM | POA: Diagnosis present

## 2013-01-04 DIAGNOSIS — I252 Old myocardial infarction: Secondary | ICD-10-CM | POA: Diagnosis not present

## 2013-01-04 DIAGNOSIS — E119 Type 2 diabetes mellitus without complications: Secondary | ICD-10-CM | POA: Diagnosis present

## 2013-01-04 DIAGNOSIS — Z9861 Coronary angioplasty status: Secondary | ICD-10-CM | POA: Diagnosis present

## 2013-01-04 DIAGNOSIS — D638 Anemia in other chronic diseases classified elsewhere: Secondary | ICD-10-CM | POA: Diagnosis present

## 2013-01-04 LAB — GLUCOSE, CAPILLARY: Glucose-Capillary: 175 mg/dL — ABNORMAL HIGH (ref 70–99)

## 2013-01-04 LAB — CBC WITH DIFFERENTIAL/PLATELET
Eosinophils Absolute: 0 10*3/uL (ref 0.0–0.7)
Hemoglobin: 11.8 g/dL — ABNORMAL LOW (ref 12.0–15.0)
Lymphs Abs: 0.9 10*3/uL (ref 0.7–4.0)
MCH: 29.7 pg (ref 26.0–34.0)
Monocytes Relative: 15 % — ABNORMAL HIGH (ref 3–12)
Neutrophils Relative %: 79 % — ABNORMAL HIGH (ref 43–77)
RBC: 3.97 MIL/uL (ref 3.87–5.11)

## 2013-01-04 LAB — COMPREHENSIVE METABOLIC PANEL
CO2: 21 mEq/L (ref 19–32)
Calcium: 8.8 mg/dL (ref 8.4–10.5)
Creatinine, Ser: 0.76 mg/dL (ref 0.50–1.10)
GFR calc Af Amer: >90 mL/min (ref >90)
GFR calc non Af Amer: 85 mL/min — ABNORMAL LOW (ref >90)
Glucose, Bld: 186 mg/dL — ABNORMAL HIGH (ref 70–99)
Total Protein: 6.5 g/dL (ref 6.0–8.3)

## 2013-01-04 LAB — URINALYSIS, ROUTINE W REFLEX MICROSCOPIC
Nitrite: NEGATIVE
Protein, ur: 100 mg/dL — AB
Urobilinogen, UA: 1 mg/dL (ref 0.0–1.0)

## 2013-01-04 LAB — LACTIC ACID, PLASMA: Lactic Acid, Venous: 0.8 mmol/L (ref 0.5–2.2)

## 2013-01-04 LAB — URINE MICROSCOPIC-ADD ON

## 2013-01-04 MED ORDER — SODIUM CHLORIDE 0.9 % IV BOLUS (SEPSIS)
1000.0000 mL | Freq: Once | INTRAVENOUS | Status: AC
  Administered 2013-01-04: 1000 mL via INTRAVENOUS

## 2013-01-04 MED ORDER — DEXTROSE 5 % IV SOLN: 1.0000 g | Freq: Once | INTRAVENOUS | Status: DC

## 2013-01-04 MED ORDER — INSULIN ASPART 100 UNIT/ML ~~LOC~~ SOLN
0.0000 [IU] | Freq: Three times a day (TID) | SUBCUTANEOUS | Status: DC
  Administered 2013-01-05: 2 [IU] via SUBCUTANEOUS
  Administered 2013-01-05 – 2013-01-15 (×8): 1 [IU] via SUBCUTANEOUS
  Administered 2013-01-16: 6 [IU] via SUBCUTANEOUS

## 2013-01-04 MED ORDER — POTASSIUM CHLORIDE CRYS ER 20 MEQ PO TBCR
60.0000 meq | EXTENDED_RELEASE_TABLET | Freq: Four times a day (QID) | ORAL | Status: DC
  Administered 2013-01-05: 60 meq via ORAL
  Filled 2013-01-04: qty 3

## 2013-01-04 NOTE — ED Notes (Signed)
Per EMS patient had spinal fusion on Friday.  Patient is currently taking Keflex PO.  Patient fell today ambulating to bathroom and hit right shoulder on a trash can.  Patient has been running a fever, having nausea, diarrhea, and weakness since her surgery.  EMS gave 1300 mg Tylenol PO on scene and 4 mg Zofran.

## 2013-01-04 NOTE — ED Provider Notes (Signed)
History     CSN: [id number]  Arrival date & time 01/04/13  1540   First MD Initiated Contact with Patient 01/04/13 1558      Chief Complaint  Patient presents with  . Fever  . Nausea  . Diarrhea    (Consider location/radiation/quality/duration/timing/severity/associated sxs/prior treatment) HPI 67 year old female presents to the emergency department after fall.  She arrived via EMS today.  Patient had  kyphoplasty one week ago (12/27/2012).  She states that she felt as if she was doing better, however her pain recently got much worse.  She has had a lot of difficulty taking care of herself at home as well as getting out of her bed.  Patient states that she fell out of her bed today trying to get up to use the restroom and landed on a plastic garbage can.  EMS arrived to help her.  EMS got in tympanic temperature of 1 is 3.1.  Patient was given 1300 mg of Tylenol by mouth.  Patient has been on Keflex after her surgery and has been taking them as prescribed.  She has a past medical history significant for diabetes.  Patient states that she has been very sleepy today.  She denies any difficulty breathing but states that she has pain when she takes a deep breath.  Patient denies any urinary symptoms.  Past Medical History  Diagnosis Date  . Diabetes mellitus   . Hypertension   . Hypercholesteremia     Past Surgical History  Procedure Date  . Eye surgery     laser , cat bil  . Tubal ligation   . Kyphoplasty 12/27/2012    Procedure: KYPHOPLASTY;  Surgeon: Emilee Hero, MD;  [facility]: [facility] OR;  Service: Orthopedics;  Laterality: Bilateral;  T-8 kyphoplasty    History reviewed. No pertinent family history.  History  Substance Use Topics  . Smoking status: Never Smoker   . Smokeless tobacco: Never Used  . Alcohol Use: No    OB History    Grav Para Term Preterm Abortions TAB SAB Ect Mult Living                  Review of Systems Ten systems reviewed and are negative for  acute change, except as noted in the HPI.   Allergies  Review of patient's allergies indicates no known allergies.  Home Medications   Current Outpatient Rx  Name  Route  Sig  Dispense  Refill  . VITAMIN D3 1000 UNITS PO CAPS   Oral   Take 2,000 Units by mouth every morning.          . FUROSEMIDE 40 MG PO TABS   Oral   Take 40 mg by mouth every morning.          . INSULIN GLARGINE 100 UNIT/ML [place] SOLN   Subcutaneous   Inject 10 Units into the skin 2 (two) times daily.          Marland Kitchen METFORMIN HCL 1000 MG PO TABS   Oral   Take 1 tablet (1,000 mg total) by mouth 2 (two) times daily with a meal.   60 tablet   0   . ADULT MULTIVITAMIN W/MINERALS CH   Oral   Take 1 tablet by mouth every morning.          Marland Kitchen ONDANSETRON 8 MG PO TBDP   Oral   Take 1 tablet (8 mg total) by mouth every 8 (eight) hours as needed for nausea.   20  tablet   0   . OXYCODONE-ACETAMINOPHEN 5-325 MG PO TABS   Oral   Take 1 tablet by mouth every 4 (four) hours as needed. 1 to 2 tablets every 6 hours as needed for pain.         Marland Kitchen PIOGLITAZONE HCL 30 MG PO TABS   Oral   Take 30 mg by mouth every morning.         Marland Kitchen RAMIPRIL 10 MG PO CAPS   Oral   Take 10 mg by mouth daily at 12 noon.          Marland Kitchen SIMVASTATIN 40 MG PO TABS   Oral   Take 40 mg by mouth at bedtime.         Marland Kitchen VITAMIN C 500 MG PO TABS   Oral   Take 1,000 mg by mouth daily.           BP 128/70  Pulse 104  Temp 99.6 F (37.6 C) (Rectal)  Resp 18  SpO2 95%  Physical Exam  Nursing note and vitals reviewed. Constitutional: She is oriented to person, place, and time. She appears well-developed and well-nourished.  HENT:  Head: Normocephalic and atraumatic.  Eyes: Conjunctivae normal are normal.  Neck: Normal range of motion. No JVD present. No thyromegaly present.  Pulmonary/Chest:       Patient's PT and 3/masses.  She appears somewhat short of breath.  O2 sats on room air dropped to 88% on ra. Patient on 95% on 2  L nasal cannula  Musculoskeletal:       2 well-healed surgical incision sites.  Sutures are in place.  No signs of infection  Neurological: She is alert and oriented to person, place, and time.  Skin: Skin is warm.       Cheeks are flushed.  Hot to touch.  Psychiatric:       Patient is sleepy    ED Course  Procedures (including critical care time)  Labs Reviewed  CBC WITH DIFFERENTIAL - Abnormal; Notable for the following:    WBC 13.8 (*)     Hemoglobin 11.8 (*)     HCT 35.8 (*)     All other components within normal limits  COMPREHENSIVE METABOLIC PANEL - Abnormal; Notable for the following:    Sodium 133 (*)     Potassium 3.1 (*)     Glucose, Bld 186 (*)     Albumin 2.7 (*)     GFR calc non Af Amer 85 (*)     All other components within normal limits  GLUCOSE, CAPILLARY - Abnormal; Notable for the following:    Glucose-Capillary 175 (*)     All other components within normal limits  LACTIC ACID, PLASMA  CULTURE, BLOOD (ROUTINE X 2)  CULTURE, BLOOD (ROUTINE X 2)  URINALYSIS, ROUTINE W REFLEX MICROSCOPIC   Dg Chest 2 View  01/04/2013  *RADIOLOGY REPORT*  Clinical Data: Nausea, fever, diarrhea  CHEST - 2 VIEW  Comparison: Chest radiograph 12/10/2012  Findings: Normal cardiac silhouette.  The ascending aorta is prominent but unchanged.  There is mild scarring at the lung bases also unchanged.  Lateral projection demonstrates compression fractures in the mid thoracic spine.  Interval vertebral plasty. No acute osseous abnormality.  IMPRESSION:  1.  No acute cardiopulmonary findings. 2. Chronic bibasilar scarring. 3. Prominent ascending aorta.  Query aortic stenosis.   Original Report Authenticated By: Genevive Bi, M.D.    Dg Shoulder Right  01/04/2013  *RADIOLOGY REPORT*  Clinical Data:  Larey Seat today with right shoulder pain  RIGHT SHOULDER - 2+ VIEW  Comparison: None.  Findings: The right humeral head is in normal position within the glenohumeral joint space.  No acute fracture is  seen.  The right [facility] joint is normally aligned.  There is right basilar atelectasis noted.  IMPRESSION: No fracture or dislocation.   Original Report Authenticated By: Dwyane Dee, M.D.      1. UTI (lower urinary tract infection)       MDM  5:22 PM Filed Vitals:   01/04/13 1546 01/04/13 1600 01/04/13 1655  BP:  128/70   Pulse:  104   Temp:  98.6 F (37 C) 99.6 F (37.6 C)  TempSrc:  Oral Rectal  Resp:  18   SpO2: 94% 95%    Patient with mild tachycardia and febrile by ems. Concern for possible developing sepsis. TTP right lower lumbar area with no ecchymosis. Xray pending   8:14 PM Patient still sleepy. I have spoken with Dr. Arthor Captain who will admit the patient for early sepsis. And UTI.    Arthor Captain, PA-C 01/04/13 2015

## 2013-01-04 NOTE — ED Notes (Signed)
Patient transported to X-ray 

## 2013-01-04 NOTE — ED Notes (Signed)
[id number]<AV> Expected date:01/04/13<BR> Expected time: 3:30 PM<BR> Means of arrival:<BR> Comments:<BR> 67yo- f-fever cough

## 2013-01-04 NOTE — ED Provider Notes (Signed)
Medical screening examination/treatment/procedure(s) were conducted as a shared visit with non-physician practitioner(s) and myself.  I personally evaluated the patient during the encounter Patient with a history significant for infection. She is febrile and tachycardic here. She is complaining of back pain however the back pain is lower back pain and not associated where her kyphoplasty was. Labs indicate a urinary tract infection which is most likely the source of her symptoms today and she will be admitted and antibiotics were administered  Gwyneth Sprout, MD 01/04/13 2357

## 2013-01-04 NOTE — H&P (Signed)
[facility] History and Physical  [name]:[id number] DOB: 09-09-1946 DOA: 01/04/2013  Referring physician: Arthor Captain, PA-C PCP: Johny Blamer, MD   Chief Complaint: Fever  HPI: [name] is a 67 y.o. female with basilar history of diabetes mellitus and hypertension. Patient had recent kyphoplasty done on 12/27/2012. She came in to the hospital because of fall. Patient reported she had difficulty with ambulation since her procedure, this afternoon she tripped while she was getting down from the bed and she is on her back, she said her head hit a plastic trashcan, she did not bleed, she called 911 and when the EMS came in to get her she was found to be febrile at 103.1. Patient brought to the emergency department for further evaluation please note that she's taking Keflex after the surgery. Upon initial evaluation in the emergency department she was found to have urinalysis consistent with UTI. Patient started on Rocephin and admitted for further evaluation.   Review of Systems:  Constitutional: Has fevers and sweats Eyes: negative for irritation, redness and visual disturbance Ears, nose, mouth, throat, and face: negative for earaches, epistaxis, nasal congestion and sore throat Respiratory: negative for cough, dyspnea on exertion, sputum and wheezing Cardiovascular: negative for chest pain, dyspnea, lower extremity edema, orthopnea, palpitations and syncope Gastrointestinal: negative for abdominal pain, constipation, diarrhea, melena, nausea and vomiting Genitourinary:negative for dysuria, frequency and hematuria Hematologic/lymphatic: negative for bleeding, easy bruising and lymphadenopathy Musculoskeletal:negative for arthralgias, muscle weakness and stiff joints Neurological: negative for coordination problems, gait problems, headaches and weakness Endocrine: negative for diabetic symptoms including polydipsia, polyuria and weight loss Allergic/Immunologic:  negative for anaphylaxis, hay fever and urticaria   Past Medical History  Diagnosis Date  . Diabetes mellitus   . Hypertension   . Hypercholesteremia    Past Surgical History  Procedure Date  . Eye surgery     laser , cat bil  . Tubal ligation   . Kyphoplasty 12/27/2012    Procedure: KYPHOPLASTY;  Surgeon: Emilee Hero, MD;  Location: [facility] OR;  Service: Orthopedics;  Laterality: Bilateral;  T-8 kyphoplasty   Social History:  reports that she has never smoked. She has never used smokeless tobacco. She reports that she does not drink alcohol or use illicit drugs. Lives at home with husband.  No Known Allergies  History reviewed. No pertinent family history.   Prior to Admission medications   Medication Sig Start Date End Date Taking? Authorizing Provider  Cholecalciferol (VITAMIN D3) 1000 UNITS CAPS Take 2,000 Units by mouth every morning.    Yes Historical Provider, MD  furosemide (LASIX) 40 MG tablet Take 40 mg by mouth every morning.    Yes Historical Provider, MD  insulin glargine (LANTUS) 100 UNIT/ML injection Inject 10 Units into the skin 2 (two) times daily.  04/04/12 04/04/13 Yes Shanker Levora Dredge, MD  metFORMIN (GLUCOPHAGE) 1000 MG tablet Take 1 tablet (1,000 mg total) by mouth 2 (two) times daily with a meal. 04/04/12 04/04/13 Yes Shanker Levora Dredge, MD  Multiple Vitamin (MULITIVITAMIN WITH MINERALS) TABS Take 1 tablet by mouth every morning.    Yes Historical Provider, MD  ondansetron (ZOFRAN ODT) 8 MG disintegrating tablet Take 1 tablet (8 mg total) by mouth every 8 (eight) hours as needed for nausea. 12/10/12  Yes Reuben Likes, MD  oxyCODONE-acetaminophen (PERCOCET/ROXICET) 5-325 MG per tablet Take 1 tablet by mouth every 4 (four) hours as needed. 1 to 2 tablets every 6 hours as needed for pain. 12/10/12  Yes  Reuben Likes, MD  pioglitazone (ACTOS) 30 MG tablet Take 30 mg by mouth every morning.   Yes Historical Provider, MD  ramipril (ALTACE) 10 MG capsule Take 10 mg by  mouth daily at 12 noon.    Yes Historical Provider, MD  simvastatin (ZOCOR) 40 MG tablet Take 40 mg by mouth at bedtime.   Yes Historical Provider, MD  vitamin C (ASCORBIC ACID) 500 MG tablet Take 1,000 mg by mouth daily.   Yes Historical Provider, MD   Physical Exam: Filed Vitals:   01/04/13 1546 01/04/13 1600 01/04/13 1655 01/04/13 1951  BP:  128/70  136/59  Pulse:  104  88  Temp:  98.6 F (37 C) 99.6 F (37.6 C) 97.9 F (36.6 C)  TempSrc:  Oral Rectal Oral  Resp:  18    SpO2: 94% 95%  98%   General appearance: alert, cooperative and no distress  Head: Normocephalic, without obvious abnormality, atraumatic  Eyes: conjunctivae/corneas clear. PERRL, EOM's intact. Fundi benign.  Nose: Nares normal. Septum midline. Mucosa normal. No drainage or sinus tenderness.  Throat: lips, mucosa, and tongue normal; teeth and gums normal  Neck: Supple, no masses, no cervical lymphadenopathy, no JVD appreciated, no meningeal signs Resp: clear to auscultation bilaterally  Chest wall: no tenderness  Cardio: regular rate and rhythm, S1, S2 normal, no murmur, click, rub or gallop  GI: soft, non-tender; bowel sounds normal; no masses, no organomegaly  Extremities: extremities normal, atraumatic, no cyanosis or edema  Skin: Skin color, texture, turgor normal. No rashes or lesions  Neurologic: Alert and oriented X 3, normal strength and tone. Normal symmetric reflexes. Normal coordination and gait   Labs on Admission:  Basic Metabolic Panel:  Lab 01/04/13 1610  NA 133*  K 3.1*  CL 96  CO2 21  GLUCOSE 186*  BUN 14  CREATININE 0.76  CALCIUM 8.8  MG --  PHOS --   Liver Function Tests:  Lab 01/04/13 1621  AST 11  ALT 10  ALKPHOS 60  BILITOT 0.5  PROT 6.5  ALBUMIN 2.7*   No results found for this basename: LIPASE:5,AMYLASE:5 in the last 168 hours No results found for this basename: AMMONIA:5 in the last 168 hours CBC:  Lab 01/04/13 1621  WBC 13.8*  NEUTROABS 10.8*  HGB 11.8*  HCT  35.8*  MCV 90.2  PLT 191   Cardiac Enzymes: No results found for this basename: CKTOTAL:5,CKMB:5,CKMBINDEX:5,TROPONINI:5 in the last 168 hours  BNP (last 3 results) No results found for this basename: PROBNP:3 in the last 8760 hours CBG:  Lab 01/04/13 1857 01/04/13 1617  GLUCAP 131* 175*    Radiological Exams on Admission: Dg Chest 2 View  01/04/2013  *RADIOLOGY REPORT*  Clinical Data: Nausea, fever, diarrhea  CHEST - 2 VIEW  Comparison: Chest radiograph 12/10/2012  Findings: Normal cardiac silhouette.  The ascending aorta is prominent but unchanged.  There is mild scarring at the lung bases also unchanged.  Lateral projection demonstrates compression fractures in the mid thoracic spine.  Interval vertebral plasty. No acute osseous abnormality.  IMPRESSION:  1.  No acute cardiopulmonary findings. 2. Chronic bibasilar scarring. 3. Prominent ascending aorta.  Query aortic stenosis.   Original Report Authenticated By: Genevive Bi, M.D.    Dg Shoulder Right  01/04/2013  *RADIOLOGY REPORT*  Clinical Data: Larey Seat today with right shoulder pain  RIGHT SHOULDER - 2+ VIEW  Comparison: None.  Findings: The right humeral head is in normal position within the glenohumeral joint space.  No acute fracture  is seen.  The right [facility] joint is normally aligned.  There is right basilar atelectasis noted.  IMPRESSION: No fracture or dislocation.   Original Report Authenticated By: Dwyane Dee, M.D.     EKG: Independently reviewed.   Assessment/Plan Active Problems:  Type II or unspecified type diabetes mellitus without mention of complication, not stated as uncontrolled  UTI (lower urinary tract infection)  Hypokalemia  Fever  H/O vertebroplasty   UTI -Has fever and urinalysis consistent with UTI, started on Rocephin. -Blood cultures and urine culture obtained. -Her antibiotics will be adjusted according to the culture results.  Diabetes mellitus type 2 -Start patient on carbohydrate modified diet,  insulin sliding scale. -Check hemoglobin A1c. It was 13.5 in May of 2013.  Hypokalemia -Replete with oral supplements.  History of vertebroplasty. -Patient denies any significant back pain, procedure site looks without evidence of infection. -Continue physical therapy while she is in the hospital.  Code Status: Full code Family Communication: Discussed with the patient Disposition Plan: Inpatient, likely she needs greater than 2 but not in the hospital.  Time spent: 70 minutes  [facility] Pager [phone]  If 7PM-7AM, please contact night-coverage [email or web address] Password [email or web address] 01/04/2013, 10:33 PM

## 2013-01-05 DIAGNOSIS — E860 Dehydration: Secondary | ICD-10-CM

## 2013-01-05 DIAGNOSIS — L02619 Cutaneous abscess of unspecified foot: Secondary | ICD-10-CM

## 2013-01-05 DIAGNOSIS — I471 Supraventricular tachycardia: Secondary | ICD-10-CM | POA: Diagnosis not present

## 2013-01-05 DIAGNOSIS — I214 Non-ST elevation (NSTEMI) myocardial infarction: Secondary | ICD-10-CM

## 2013-01-05 DIAGNOSIS — N390 Urinary tract infection, site not specified: Principal | ICD-10-CM

## 2013-01-05 DIAGNOSIS — I498 Other specified cardiac arrhythmias: Secondary | ICD-10-CM

## 2013-01-05 DIAGNOSIS — I252 Old myocardial infarction: Secondary | ICD-10-CM | POA: Diagnosis not present

## 2013-01-05 LAB — DIFFERENTIAL
Basophils Absolute: 0 10*3/uL (ref 0.0–0.1)
Eosinophils Relative: 1 % (ref 0–5)
Lymphocytes Relative: 4 % — ABNORMAL LOW (ref 12–46)
Monocytes Relative: 6 % (ref 3–12)
WBC Morphology: INCREASED

## 2013-01-05 LAB — GLUCOSE, CAPILLARY
Glucose-Capillary: 132 mg/dL — ABNORMAL HIGH (ref 70–99)
Glucose-Capillary: 147 mg/dL — ABNORMAL HIGH (ref 70–99)
Glucose-Capillary: 167 mg/dL — ABNORMAL HIGH (ref 70–99)

## 2013-01-05 LAB — HEPARIN LEVEL (UNFRACTIONATED)
Heparin Unfractionated: 0.1 IU/mL — ABNORMAL LOW (ref 0.30–0.70)
Heparin Unfractionated: 0.15 IU/mL — ABNORMAL LOW (ref 0.30–0.70)

## 2013-01-05 LAB — BASIC METABOLIC PANEL
CO2: 19 mEq/L (ref 19–32)
Glucose, Bld: 166 mg/dL — ABNORMAL HIGH (ref 70–99)
Potassium: 3.1 mEq/L — ABNORMAL LOW (ref 3.5–5.1)
Sodium: 136 mEq/L (ref 135–145)

## 2013-01-05 LAB — CBC
Hemoglobin: 10.4 g/dL — ABNORMAL LOW (ref 12.0–15.0)
MCH: 29.5 pg (ref 26.0–34.0)
MCV: 91.2 fL (ref 78.0–100.0)
RBC: 3.53 MIL/uL — ABNORMAL LOW (ref 3.87–5.11)

## 2013-01-05 LAB — HEMOGLOBIN A1C
Hgb A1c MFr Bld: 6.1 % — ABNORMAL HIGH (ref <5.7)
Mean Plasma Glucose: 128 mg/dL — ABNORMAL HIGH (ref <117)

## 2013-01-05 MED ORDER — HEPARIN BOLUS VIA INFUSION
4000.0000 [IU] | Freq: Once | INTRAVENOUS | Status: AC
  Administered 2013-01-05: 4000 [IU] via INTRAVENOUS
  Filled 2013-01-05: qty 4000

## 2013-01-05 MED ORDER — ONDANSETRON HCL 4 MG/2ML IJ SOLN
4.0000 mg | Freq: Four times a day (QID) | INTRAMUSCULAR | Status: DC | PRN
  Administered 2013-01-05 – 2013-01-15 (×4): 4 mg via INTRAVENOUS
  Filled 2013-01-05 (×4): qty 2

## 2013-01-05 MED ORDER — HEPARIN (PORCINE) IN NACL 100-0.45 UNIT/ML-% IJ SOLN
1000.0000 [IU]/h | INTRAMUSCULAR | Status: DC
  Administered 2013-01-05: 1000 [IU]/h via INTRAVENOUS
  Filled 2013-01-05: qty 250

## 2013-01-05 MED ORDER — METOPROLOL TARTRATE 25 MG PO TABS
25.0000 mg | ORAL_TABLET | Freq: Two times a day (BID) | ORAL | Status: DC
  Administered 2013-01-05 – 2013-01-09 (×9): 25 mg via ORAL
  Filled 2013-01-05 (×11): qty 1

## 2013-01-05 MED ORDER — ADULT MULTIVITAMIN W/MINERALS CH
1.0000 | ORAL_TABLET | Freq: Every morning | ORAL | Status: DC
  Administered 2013-01-05 – 2013-01-17 (×12): 1 via ORAL
  Filled 2013-01-05 (×13): qty 1

## 2013-01-05 MED ORDER — HEPARIN SODIUM (PORCINE) 5000 UNIT/ML IJ SOLN: 5000.0000 [IU] | Freq: Three times a day (TID) | INTRAMUSCULAR | Status: DC

## 2013-01-05 MED ORDER — SIMVASTATIN 40 MG PO TABS
40.0000 mg | ORAL_TABLET | Freq: Every day | ORAL | Status: DC
  Administered 2013-01-05 – 2013-01-16 (×14): 40 mg via ORAL
  Filled 2013-01-05 (×17): qty 1

## 2013-01-05 MED ORDER — SODIUM CHLORIDE 0.9 % IJ SOLN
3.0000 mL | Freq: Two times a day (BID) | INTRAMUSCULAR | Status: DC
  Administered 2013-01-05 – 2013-01-16 (×19): 3 mL via INTRAVENOUS

## 2013-01-05 MED ORDER — RAMIPRIL 10 MG PO CAPS
10.0000 mg | ORAL_CAPSULE | Freq: Every day | ORAL | Status: DC
  Filled 2013-01-05: qty 1

## 2013-01-05 MED ORDER — SODIUM CHLORIDE 0.9 % IV BOLUS (SEPSIS)
500.0000 mL | Freq: Once | INTRAVENOUS | Status: AC
  Administered 2013-01-05: 500 mL via INTRAVENOUS

## 2013-01-05 MED ORDER — DEXTROSE 5 % IV SOLN
1.0000 g | Freq: Every day | INTRAVENOUS | Status: DC
  Administered 2013-01-05 – 2013-01-11 (×6): 1 g via INTRAVENOUS
  Filled 2013-01-05 (×8): qty 10

## 2013-01-05 MED ORDER — POTASSIUM CHLORIDE CRYS ER 20 MEQ PO TBCR
40.0000 meq | EXTENDED_RELEASE_TABLET | Freq: Every day | ORAL | Status: AC
  Administered 2013-01-06 – 2013-01-07 (×2): 40 meq via ORAL
  Filled 2013-01-05 (×2): qty 2

## 2013-01-05 MED ORDER — ACETAMINOPHEN 325 MG PO TABS
ORAL_TABLET | ORAL | Status: AC
  Administered 2013-01-05: 01:00:00
  Filled 2013-01-05: qty 2

## 2013-01-05 MED ORDER — HEPARIN (PORCINE) IN NACL 100-0.45 UNIT/ML-% IJ SOLN
1700.0000 [IU]/h | INTRAMUSCULAR | Status: DC
  Administered 2013-01-05: 1700 [IU]/h via INTRAVENOUS
  Filled 2013-01-05: qty 250

## 2013-01-05 MED ORDER — ACETAMINOPHEN 325 MG PO TABS
650.0000 mg | ORAL_TABLET | Freq: Four times a day (QID) | ORAL | Status: DC | PRN
  Filled 2013-01-05: qty 2

## 2013-01-05 MED ORDER — FUROSEMIDE 40 MG PO TABS
40.0000 mg | ORAL_TABLET | Freq: Every morning | ORAL | Status: DC
  Administered 2013-01-05 – 2013-01-06 (×2): 40 mg via ORAL
  Filled 2013-01-05 (×2): qty 1

## 2013-01-05 MED ORDER — MORPHINE SULFATE 2 MG/ML IJ SOLN
1.0000 mg | INTRAMUSCULAR | Status: DC | PRN
  Administered 2013-01-15: 1 mg via INTRAVENOUS
  Filled 2013-01-05 (×2): qty 1

## 2013-01-05 MED ORDER — HEPARIN BOLUS VIA INFUSION
2000.0000 [IU] | Freq: Once | INTRAVENOUS | Status: AC
  Administered 2013-01-05: 2000 [IU] via INTRAVENOUS
  Filled 2013-01-05: qty 2000

## 2013-01-05 MED ORDER — ASPIRIN 325 MG PO TABS
325.0000 mg | ORAL_TABLET | Freq: Every day | ORAL | Status: DC
  Administered 2013-01-05 – 2013-01-17 (×11): 325 mg via ORAL
  Filled 2013-01-05 (×14): qty 1

## 2013-01-05 MED ORDER — VITAMINS A & D EX OINT
TOPICAL_OINTMENT | CUTANEOUS | Status: AC
  Administered 2013-01-05: 5
  Filled 2013-01-05: qty 5

## 2013-01-05 MED ORDER — INSULIN GLARGINE 100 UNIT/ML ~~LOC~~ SOLN
10.0000 [IU] | Freq: Two times a day (BID) | SUBCUTANEOUS | Status: DC
  Administered 2013-01-05: 10 [IU] via SUBCUTANEOUS
  Administered 2013-01-05: 22:00:00 via SUBCUTANEOUS
  Administered 2013-01-06 – 2013-01-17 (×18): 10 [IU] via SUBCUTANEOUS

## 2013-01-05 MED ORDER — ACETAMINOPHEN 650 MG RE SUPP: 650.0000 mg | Freq: Four times a day (QID) | RECTAL | Status: DC | PRN

## 2013-01-05 MED ORDER — OXYCODONE-ACETAMINOPHEN 5-325 MG PO TABS
1.0000 | ORAL_TABLET | Freq: Four times a day (QID) | ORAL | Status: DC | PRN
  Administered 2013-01-05 (×2): 1 via ORAL
  Administered 2013-01-06: 2 via ORAL
  Administered 2013-01-06 (×2): 1 via ORAL
  Administered 2013-01-07: 2 via ORAL
  Filled 2013-01-05 (×4): qty 1
  Filled 2013-01-05 (×2): qty 2

## 2013-01-05 MED ORDER — SODIUM CHLORIDE 0.9 % IV SOLN: Freq: Once | INTRAVENOUS | Status: DC

## 2013-01-05 MED ORDER — HEPARIN (PORCINE) IN NACL 100-0.45 UNIT/ML-% IJ SOLN
1400.0000 [IU]/h | INTRAMUSCULAR | Status: DC
Start: 2013-01-05 — End: 2013-01-05
  Administered 2013-01-05: 1400 [IU]/h via INTRAVENOUS
  Filled 2013-01-05 (×4): qty 250

## 2013-01-05 MED ORDER — ONDANSETRON HCL 4 MG PO TABS
4.0000 mg | ORAL_TABLET | Freq: Four times a day (QID) | ORAL | Status: DC | PRN
  Administered 2013-01-10: 4 mg via ORAL
  Filled 2013-01-05: qty 1

## 2013-01-05 NOTE — Progress Notes (Signed)
*  PRELIMINARY RESULTS* Echocardiogram 2D Echocardiogram has been performed.  Jeryl Columbia 01/05/2013, 9:56 AM

## 2013-01-05 NOTE — Progress Notes (Signed)
Pt arrived from ED not on monitor, when applied tele monitor Heart rate  202.  Called Lenny Pastel who came to bedside.  EKG, IVF, Tylenol, ice packs to groins and arm pits ordered and carried out.  Pt heart rate decreased to 140.  Will continue to observe and treat accordingly.  Barnett Hatter P

## 2013-01-05 NOTE — Progress Notes (Addendum)
Late entry due to system downtime: RRT 01/05/13 0020- Called to pt's room for HR 200s on arrival from ED. Monitor applied and HR 130s-140s. Pt not in any obvious distress, very pleasant and talkative. 0025- Pt felt warm- axillary temp 102.6 and  BP 96/50(61). Fluid bolus started per NP and ice packs applied to arm pits and groin. HR remained tachy highest being 200. 0030- 102.9 rectal temp, 96% O2 sat on 2L [place], 94/63(70),. Pt remained to appear in no distress. 0045- 102/58, HR 143. Per NP- will recheck temp after tylenol given and ice packs in place. Nurse at bedside will continue to monitor. I told her to call me if she has any more problems.

## 2013-01-05 NOTE — Progress Notes (Signed)
ANTICOAGULATION CONSULT NOTE - Initial Consult  Pharmacy Consult for Heparin Indication: atrial fibrillation/ACS  No Known Allergies  Patient Measurements:   Heparin Dosing Weight:   Vital Signs: Temp: 99.5 F (37.5 C) (02/07 2345) Temp src: Rectal (02/07 2345) BP: 136/59 mmHg (02/07 1951) Pulse Rate: 202 (02/08 0010)  Labs:  Recent Labs  01/04/13 1621 01/05/13 0205  HGB 11.8* 10.4*  HCT 35.8* 32.2*  PLT 191 165  CREATININE 0.76 1.05  TROPONINI  --  1.67*    The CrCl is unknown because both a height and weight (above a minimum accepted value) are required for this calculation.   Medical History: Past Medical History  Diagnosis Date  . Diabetes mellitus   . Hypertension   . Hypercholesteremia     Medications:  Prescriptions prior to admission  Medication Sig Dispense Refill  . Cholecalciferol (VITAMIN D3) 1000 UNITS CAPS Take 2,000 Units by mouth every morning.       . furosemide (LASIX) 40 MG tablet Take 40 mg by mouth every morning.       . insulin glargine (LANTUS) 100 UNIT/ML injection Inject 10 Units into the skin 2 (two) times daily.       . metFORMIN (GLUCOPHAGE) 1000 MG tablet Take 1 tablet (1,000 mg total) by mouth 2 (two) times daily with a meal.  60 tablet  0  . Multiple Vitamin (MULITIVITAMIN WITH MINERALS) TABS Take 1 tablet by mouth every morning.       . ondansetron (ZOFRAN ODT) 8 MG disintegrating tablet Take 1 tablet (8 mg total) by mouth every 8 (eight) hours as needed for nausea.  20 tablet  0  . oxyCODONE-acetaminophen (PERCOCET/ROXICET) 5-325 MG per tablet Take 1 tablet by mouth every 4 (four) hours as needed. 1 to 2 tablets every 6 hours as needed for pain.      . pioglitazone (ACTOS) 30 MG tablet Take 30 mg by mouth every morning.      . ramipril (ALTACE) 10 MG capsule Take 10 mg by mouth daily at 12 noon.       . simvastatin (ZOCOR) 40 MG tablet Take 40 mg by mouth at bedtime.      . vitamin C (ASCORBIC ACID) 500 MG tablet Take 1,000 mg by  mouth daily.       Infusions:  . heparin 1,000 Units/hr (01/05/13 0430)    Assessment: Patient with Afib and heparin for cardiac issues (elevated troponin)  Goal of Therapy:  Heparin level 0.3-0.7 units/ml Monitor platelets by anticoagulation protocol: Yes   Plan:  Heparin bolus 4000 units iv x1 Heparin drip at 1000 units/hr Daily CBC/heparin level Heparin level at [address], [place] 01/05/2013,6:10 AM

## 2013-01-05 NOTE — Progress Notes (Signed)
Earlier in evening, approx. 1230 pm, BP decreased to 78/50, HR 85.  Asx, denied any concerns.  Had taken Lopressor 25 mg for 1000 a.m. Routine dose and was due Altace 10 mg po at noon.  Dr. Izola Price informed of decrease in BP with pt Asx.  Ordered NS bolus of 500cc and d/c altace.  Bolus given with BP increasing to 90/54 at approx. 1330 pm.  BP rechecked at 1500 with result of 99/57, HR 84.  Pt stated she was "feeling better".  Generally looks better at this point.  Will cont to monitor.  HR for much of day in 90's, SR.

## 2013-01-05 NOTE — Progress Notes (Addendum)
Patient ID: [name], female   DOB: 1946/10/19, 67 y.o.   MRN: [id number] [facility] PRO[facility]ESS [facility]  AIDALY CORDNER WUJ:[id number] DOB: 1946/07/29 DOA: 01/04/2013 PCP: Johny Blamer, MD  Brief narrative: Pt is 67 y.o. female with history of diabetes mellitus and hypertension, status post recent kyphoplasty (12/27/2012). She presented to [facility] ED with main concern of difficulty with ambulation at home and as sustained fall after trying to get up from the bed, the afternoon just prior to admission. She called 911 and upon their arrival, pt reports they checked her temperature which was 103.1 F. In ED, UA consistent with UTI and [facility] asked to admit for further evaluation. Upon arrival to the floor, pt developed tachycardia, narrow complex SVT with rate of 200 bpm, elevated troponin 1.67, and mild ST depression in II and aVF. Cardiology was consulted.  Principal Problem:   UTI (lower urinary tract infection) - pt afebrile and WBC now within normal limits - continue Rocephin - follow up on urine culture Active Problems:   SVT (supraventricular tachycardia) - rate better controlled - continue Metoprolol, appreciate cardio input   Non-QWMI - per cardio, may need cardiac cath before discharge, follow up on 2 D ECHO - trend troponins - continue Heparin    Hyponatremia - likely pre renal etiology and lasix - now resolved   Hypotension - hold Lisinopril for now as pt is already on Lasix and Metoprolol - may need to readjust the dosing if BP remains low    Type II or unspecified type diabetes mellitus - will check A1C - continue Lantus and SSI    Hypokalemia - secondary to Lasix - supplement and check BMP in AM  Consultants:  Cardiology   Procedures/Studies: Dg Chest 2 View 01/04/2013  1.  No acute cardiopulmonary findings.  2. Chronic bibasilar scarring.  3. Prominent ascending aorta.  Query aortic stenosis.    Dg Shoulder Right 01/04/2013   No fracture or dislocation.     Antibiotics:  Rocephin 02/08 -->  Code Status: Full Family Communication: Pt at bedside Disposition Plan: Home when medically stable  HPI/Subjective: No events overnight.   Objective: Filed Vitals:   01/05/13 0954 01/05/13 1100 01/05/13 1330 01/05/13 1500  BP: 101/54 78/50 90/54  99/57  Pulse: 100 84 93 84  Temp:  96.6 F (35.9 C) 97.7 F (36.5 C)   TempSrc:  Oral Oral   Resp:  26 24   Height:      Weight:      SpO2:  99% 100%     Intake/Output Summary (Last 24 hours) at 01/05/13 2008 Last data filed at 01/05/13 1700  Gross per 24 hour  Intake   1875 ml  Output      1 ml  Net   1874 ml    Exam:   General:  Pt is alert, follows commands appropriately, not in acute distress  Cardiovascular: Irregular rate and rhythm, no murmurs, no rubs, no gallops  Respiratory: Clear to auscultation bilaterally, no wheezing, no crackles, no rhonchi  Abdomen: Soft, non tender, non distended, bowel sounds present, no guarding  Extremities: No edema, pulses DP and PT palpable bilaterally  Neuro: Grossly nonfocal  Data Reviewed: Basic Metabolic Panel:  Recent Labs Lab 01/04/13 1621 01/05/13 0205  NA 133* 136  K 3.1* 3.1*  CL 96 100  CO2 21 19  GLUCOSE 186* 166*  BUN 14 18  CREATININE 0.76 1.05  CALCIUM 8.8 8.0*   Liver Function Tests:  Recent Labs Lab  01/04/13 1621  AST 11  ALT 10  ALKPHOS 60  BILITOT 0.5  PROT 6.5  ALBUMIN 2.7*   CBC:  Recent Labs Lab 01/04/13 1621 01/05/13 0205  WBC 13.8* 9.7  NEUTROABS 10.8* 8.6*  HGB 11.8* 10.4*  HCT 35.8* 32.2*  MCV 90.2 91.2  PLT 191 165   Cardiac Enzymes:  Recent Labs Lab 01/05/13 0205 01/05/13 0730 01/05/13 1426  TROPONINI 1.67* 3.29* 4.16*   CBG:  Recent Labs Lab 01/04/13 1857 01/05/13 0018 01/05/13 0816 01/05/13 1205 01/05/13 1716  GLUCAP 131* 154* 132* 147* 167*    Recent Results (from the past 240 hour(s))  CULTURE, BLOOD (ROUTINE X 2)     Status: None   Collection Time     01/04/13  4:21 PM      Result Value Range Status   Specimen Description BLOOD RIGHT ARM   Final   Special Requests BOTTLES DRAWN AEROBIC AND ANAEROBIC 5CC EACH   Final   Culture  Setup Time 01/04/2013 22:30   Final   Culture     Final   Value:        BLOOD CULTURE RECEIVED NO GROWTH TO DATE CULTURE WILL BE HELD FOR 5 DAYS BEFORE ISSUING A FINAL NEGATIVE REPORT   Report Status PENDING   Incomplete  CULTURE, BLOOD (ROUTINE X 2)     Status: None   Collection Time    01/04/13  4:33 PM      Result Value Range Status   Specimen Description BLOOD RIGHT HAND   Final   Special Requests BOTTLES DRAWN AEROBIC AND ANAEROBIC [facility] EACH   Final   Culture  Setup Time 01/04/2013 22:30   Final   Culture     Final   Value:        BLOOD CULTURE RECEIVED NO GROWTH TO DATE CULTURE WILL BE HELD FOR 5 DAYS BEFORE ISSUING A FINAL NEGATIVE REPORT   Report Status PENDING   Incomplete     Scheduled Meds: . aspirin  325 mg Oral Daily  . cefTRIAXone  IV  1 g Intravenous Q0600  . furosemide  40 mg Oral q morning - 10a  . insulin aspart  0-9 Units Subcutaneous TID WC  . insulin glargine  10 Units Subcutaneous BID  . metoprolol tartrate  25 mg Oral BID  . multivitamin   1 tablet Oral q morning - 10a  . simvastatin  40 mg Oral QHS   Continuous Infusions: . heparin 1,400 Units/hr (01/05/13 1345)     Debbora Presto, MD  [facility] Pager [phone]  If 7PM-7AM, please contact night-coverage [email or web address] Password [email or web address] 01/05/2013, 8:08 PM   LOS: 1 day

## 2013-01-05 NOTE — Progress Notes (Signed)
CRITICAL VALUE ALERT  Critical value received:  Troponin 1.67  Date of notification:  01/05/13  Time of notification:  0300  Critical value read back:yes  Nurse who received alert:  Randa Evens  MD notified (1st page):  Lenny Pastel  Time of first page:  0305  MD notified (2nd page):  Time of second page:  Responding MD:  Lenny Pastel  Time MD responded:  [phone]

## 2013-01-05 NOTE — Progress Notes (Addendum)
01-05-13 NSG  Troponi returns - continues to be elevated at 3.37.  Last one around 2 pm was 4.16.   Pt remains anxious, did vomit x1 at 2030, generalized pain, intermittent tremors, HR remains tachy 100 to 130's.  Temp was 100.9 at 2200. Have notified Lenny Pastel and acknowledges information. Has repeat troponi in 6 hours.  Will continue to monitor

## 2013-01-05 NOTE — Progress Notes (Signed)
Utilization review completed.  

## 2013-01-05 NOTE — Progress Notes (Signed)
ANTICOAGULATION CONSULT NOTE - Follow Up Consult  Pharmacy Consult for Heparin Indication: NQWMI  No Known Allergies  Patient Measurements: Height: 5\' 8"  (172.7 cm) Weight: 205 lb (92.987 kg) IBW/kg (Calculated) : 63.9 Heparin Dosing Weight: 74kg  Labs:  Recent Labs  01/04/13 1621 01/05/13 0205 01/05/13 0730 01/05/13 1231 01/05/13 1426 01/05/13 1952  HGB 11.8* 10.4*  --   --   --   --   HCT 35.8* 32.2*  --   --   --   --   PLT 191 165  --   --   --   --   HEPARINUNFRC  --   --   --  0.10*  --  0.15*  CREATININE 0.76 1.05  --   --   --   --   TROPONINI  --  1.67* 3.29*  --  4.16*  --    Estimated Creatinine Clearance: 61.1 ml/min (by C-G formula based on Cr of 1.05).   Medications:  Infusions:  . heparin 1,400 Units/hr (01/05/13 1345)  . [DISCONTINUED] heparin 1,000 Units/hr (01/05/13 0430)    Assessment:  67 yo female s/p kyphoplasty 12/27/12, admitted 2/7 after falling at home.  Developed SVT with ST depression, troponins elevated.  IV heparin started 2/8.  Heparin level (0.15) remains subtherapeutic, but increased after last rate increase to 1400 ml/hr.    RN confirms infusion rate w/o interruptions.  No bleeding reporte   Goal of Therapy:  Heparin level 0.3-0.7 units/ml Monitor platelets by anticoagulation protocol: Yes   Plan:   Re-bolus heparin IV 2000 units x1 dose  Increase to heparin 1700 units/hr  Recheck heparin level in 6hrs  Daily heparin level to begin 01/07/13   Lynann Beaver PharmD, BCPS Pager [phone] 01/05/2013 8:32 PM

## 2013-01-05 NOTE — Progress Notes (Signed)
Dr. Izola Price informed via text of troponins trending upwards.

## 2013-01-05 NOTE — Progress Notes (Signed)
ANTICOAGULATION CONSULT NOTE - Follow Up Consult  Pharmacy Consult for Heparin Indication: NQWMI  No Known Allergies  Patient Measurements: Height: 5\' 8"  (172.7 cm) Weight: 197 lb 1.6 oz (89.404 kg) IBW/kg (Calculated) : 63.9 Heparin Dosing Weight: 74kg  Vital Signs: Temp: 96.6 F (35.9 C) (02/08 1100) Temp src: Oral (02/08 1100) BP: 78/50 mmHg (02/08 1100) Pulse Rate: 84 (02/08 1100)  Labs:  Recent Labs  01/04/13 1621 01/05/13 0205 01/05/13 0730 01/05/13 1231  HGB 11.8* 10.4*  --   --   HCT 35.8* 32.2*  --   --   PLT 191 165  --   --   HEPARINUNFRC  --   --   --  0.10*  CREATININE 0.76 1.05  --   --   TROPONINI  --  1.67* 3.29*  --     Estimated Creatinine Clearance: 60 ml/min (by C-G formula based on Cr of 1.05).   Medications:  Scheduled:  . [COMPLETED] acetaminophen      . aspirin  325 mg Oral Daily  . cefTRIAXone (ROCEPHIN)  IV  1 g Intravenous Q0600  . furosemide  40 mg Oral q morning - 10a  . [COMPLETED] heparin  4,000 Units Intravenous Once  . insulin aspart  0-9 Units Subcutaneous TID WC  . insulin glargine  10 Units Subcutaneous BID  . metoprolol tartrate  25 mg Oral BID  . multivitamin with minerals  1 tablet Oral q morning - 10a  . [EXPIRED] potassium chloride  60 mEq Oral Q6H  . ramipril  10 mg Oral Q1200  . simvastatin  40 mg Oral QHS  . [COMPLETED] sodium chloride  1,000 mL Intravenous Once  . [COMPLETED] sodium chloride  1,000 mL Intravenous Once  . sodium chloride  3 mL Intravenous Q12H  . [COMPLETED] vitamin A & D      . [DISCONTINUED] cefTRIAXone (ROCEPHIN)  IV  1 g Intravenous Once  . [DISCONTINUED] heparin  5,000 Units Subcutaneous Q8H   Infusions:  . heparin 1,000 Units/hr (01/05/13 0430)    Assessment:  67 yo female s/p kyphoplasty 12/27/12, admitted 2/7 after falling at home.  Developed SVT with ST depression, troponins elevated so IV heparin started 2/8 ~04:30 and Cardiology consulted.  Plan is for cardiac catheterization  prior to discharge as she is at high risk for CAD. Beta blocker and aspirin therapy initiated; already on ACEI and statin.  First heparin level subtherapeutic (0.1) on 1000 ml/hr  CBC stable, no bleeding reported   Goal of Therapy:  Heparin level 0.3-0.7 units/ml Monitor platelets by anticoagulation protocol: Yes   Plan:   Increase heparin 1400 units/hr  Recheck heparin level in 6hrs   Loralee Pacas, PharmD, BCPS Pager: [phone] 01/05/2013,1:20 PM

## 2013-01-05 NOTE — Consult Note (Signed)
CARDIOLOGY CONSULT NOTE  Patient ID: [name] MRN: [id number] DOB/AGE: 69-Jul-1947 69 y.o.  Admit date: 01/04/2013 Primary Physician Johny Blamer, MD Primary Cardiologist None Chief Complaint  Fever  HPI:  The patient had a fall at home. She called EMS. She was found to be febrile. On presentation to the hospital she had a white blood cell count 13.8. Chest x-ray was without acute pulmonary disease. However, she was found to have a UTI and is Rocephin. She has been febrile up to 103.  Earlier this a.m. she developed a tachycardia. This was a narrow complex supraventricular tachYcardia with a rate of 200. With this she had diffuse ST depression in the anterolateral leads. There was also some mild ST depression in II and aVF.  She had a subsequent spontaneous resolution of this with resolution of diffuse ST changes.  The patient reported no chest pain with this. However, her troponin was elevated at 1.67.  She says at home palpitations occasionally but hasn't paid any attention to this. She has not had any presyncope or syncope. She does not report any chest pressure, neck or arm discomfort. She doesn't have any shortness of breath, PND or orthopnea. She does some walking a long driveway. She hasn't noticed any fevers or chills though she might have felt a little bit warm yesterday.   Past Medical History  Diagnosis Date  . Diabetes mellitus   . Hypertension   . Hypercholesteremia     Past Surgical History  Procedure Laterality Date  . Eye surgery      laser , cat bil  . Tubal ligation    . Kyphoplasty  12/27/2012    Procedure: KYPHOPLASTY;  Surgeon: Emilee Hero, MD;  Location: [facility] OR;  Service: Orthopedics;  Laterality: Bilateral;  T-8 kyphoplasty    No Known Allergies Prescriptions prior to admission  Medication Sig Dispense Refill  . Cholecalciferol (VITAMIN D3) 1000 UNITS CAPS Take 2,000 Units by mouth every morning.       . furosemide (LASIX) 40 MG tablet Take 40  mg by mouth every morning.       . insulin glargine (LANTUS) 100 UNIT/ML injection Inject 10 Units into the skin 2 (two) times daily.       . metFORMIN (GLUCOPHAGE) 1000 MG tablet Take 1 tablet (1,000 mg total) by mouth 2 (two) times daily with a meal.  60 tablet  0  . Multiple Vitamin (MULITIVITAMIN WITH MINERALS) TABS Take 1 tablet by mouth every morning.       . ondansetron (ZOFRAN ODT) 8 MG disintegrating tablet Take 1 tablet (8 mg total) by mouth every 8 (eight) hours as needed for nausea.  20 tablet  0  . oxyCODONE-acetaminophen (PERCOCET/ROXICET) 5-325 MG per tablet Take 1 tablet by mouth every 4 (four) hours as needed. 1 to 2 tablets every 6 hours as needed for pain.      . pioglitazone (ACTOS) 30 MG tablet Take 30 mg by mouth every morning.      . ramipril (ALTACE) 10 MG capsule Take 10 mg by mouth daily at 12 noon.       . simvastatin (ZOCOR) 40 MG tablet Take 40 mg by mouth at bedtime.      . vitamin C (ASCORBIC ACID) 500 MG tablet Take 1,000 mg by mouth daily.       History reviewed. No pertinent family history.  History   Social History  . Marital Status: Married    Spouse Name: N/A  Number of Children: N/A  . Years of Education: N/A   Occupational History  . Not on file.   Social History Main Topics  . Smoking status: Never Smoker   . Smokeless tobacco: Never Used  . Alcohol Use: No  . Drug Use: No  . Sexually Active: Yes   Other Topics Concern  . Not on file   Social History Narrative  . No narrative on file     As stated in the HPI and negative for all other systems.  Physical Exam: Blood pressure 136/59, pulse 202, temperature 99.5 F (37.5 C), temperature source Rectal, resp. rate 18, SpO2 98.00%.  GENERAL:  Well appearing HEENT:  Pupils equal round and reactive, fundi not visualized, oral mucosa unremarkable, upper dentures NECK:  No jugular venous distention, waveform within normal limits, carotid upstroke brisk and symmetric, no bruits, no  thyromegaly LYMPHATICS:  No cervical, inguinal adenopathy LUNGS:  Clear to auscultation bilaterally BACK:  No CVA tenderness CHEST:  Unremarkable HEART:  PMI not displaced or sustained,S1 and S2 within normal limits, no S3, no S4, no clicks, no rubs, no murmurs, tachycardia ABD:  Flat, positive bowel sounds normal in frequency in pitch, no bruits, no rebound, no guarding, no midline pulsatile mass, no hepatomegaly, no splenomegaly EXT:  2 plus pulses throughout, no edema, no cyanosis no clubbing SKIN:  No rashes no nodules NEURO:  Cranial nerves II through XII grossly intact, motor grossly intact throughout PSYCH:  Cognitively intact, oriented to person place and time   Labs: Lab Results  Component Value Date   BUN 18 01/05/2013   Lab Results  Component Value Date   CREATININE 1.05 01/05/2013   Lab Results  Component Value Date   NA 136 01/05/2013   K 3.1* 01/05/2013   CL 100 01/05/2013   CO2 19 01/05/2013   Lab Results  Component Value Date   TROPONINI 1.67* 01/05/2013   Lab Results  Component Value Date   WBC 9.7 01/05/2013   HGB 10.4* 01/05/2013   HCT 32.2* 01/05/2013   MCV 91.2 01/05/2013   PLT 165 01/05/2013   No results found for this basename: CHOL, HDL, LDLCALC, LDLDIRECT, TRIG, CHOLHDL   Lab Results  Component Value Date   ALT 10 01/04/2013   AST 11 01/04/2013   ALKPHOS 60 01/04/2013   BILITOT 0.5 01/04/2013     Radiology:  CXR:  No evidence of pneumonia or acute air space disease.    EKG:  Tachycardia, rate 141, axis within normal limits, intervals within normal limits, no acute ST-T wave changes.  ASSESSMENT AND PLAN:   SVT:  The patient had a short run of this but no prior history. She'll be observed on telemetry and we can consider outpatient event monitoring.  I will start a low dose beta blocker.  TSH pending  NQWMI:  This may be secondary to SVT.  However, she is high risk for obstructive coronary disease. She will get an echocardiogram. She will need cardiac  catheterization prior to discharge.    UTI:  Per the primary team.    ANEMIA:  I will check stool guaiac.  DIABETES:  A1C pending  SignedRollene Rotunda 01/05/2013, 5:49 AM

## 2013-01-05 NOTE — Progress Notes (Signed)
[facility] hospital[facility] progress note. Chief complaint. Tachycardia. History of present illness. This 67 year old female was admitted through [facility] long emergency room with chief complaint of fever. She was found by EMS to have a temperature of 103.1. She was felt to have a urinary tract infection and started on Rocephin and transitioned to a telemetry floor. On arrival to the telemetry floor she was noted to be tachycardic with a pulse rate of 200. I was called to the bedside along with rapid response. I found the patient in no distress and with no specific complaints. In particular she denied any dyspnea or chest pain. He 12-lead EKG showed ST depression in the V. leads and ST elevation in aVR. The patient remained tachycardic for about 15 minutes with a rate of 200 and then spontaneously dropped to a rate of 140 per minute. Patient was also noted to have re\re spiked a fever 102.9 rectal. A repeat EKG was done with the pulse rate in the 140s. A repeat EKG showed near resolution of ST depression in the V. leads and resolution of the ST elevation in aVR. I did review the EKG's with Dr. Conley Rolls who agreed with the above assessment. The patient denies any prior history of known coronary artery disease. She states that she has never seen a cardiologist. I did order a serial troponin for 3 sets every 6 hours. The first troponin has now resulted and is elevated at 1.67. Vital signs. Temperature 102.9 rectal, pulse 140s, respiration 20, blood pressure 136/59. O2 sats 98%. General appearance. Well-developed elderly female who is alert, cooperative, and oriented. No distress evident and has no specific complaints. Cardiac. Rate and rhythm regular. No murmur, S3, S4. Mild pedal edema. Lungs. Breath sounds clear and equal. Abdomen. Soft with positive bowel sounds. No pain. Impression/plan. Problem #1. Tachycardia. Both EKG suggest sinus tachycardia. Initial EKG with a rate of 200 showed ST wave changes consistent with demand  ischemia. Repeat EKG with a rate of 140 showed near resolution of ST changes. It is thought that current tachycardia is being driven by the patient's fever. She has not had any further rate spikes above 140 since the initial incident. She continues on the antibiotic therapy and has had blood and urine cultures done. She has a Tylenol for fever and I encouraged nursing staff to utilize ice to bring fever down. Problem #2. EKG changes with elevated troponin. Again the changes noted per EKG with patient's pulse in the 200s suggestive of demand ischemia. First troponin return elevated 1.67. I did contact Dr. Antoine Poche on-call cardiology. He requests we initiate a heparin drip and to arrange for a 2-D echocardiogram. These orders have been conveyed to nursing. Cardiology will see the patient later this a.m.

## 2013-01-06 LAB — BASIC METABOLIC PANEL WITH GFR
BUN: 30 mg/dL — ABNORMAL HIGH (ref 6–23)
CO2: 20 meq/L (ref 19–32)
Calcium: 8 mg/dL — ABNORMAL LOW (ref 8.4–10.5)
Chloride: 99 meq/L (ref 96–112)
Creatinine, Ser: 1.25 mg/dL — ABNORMAL HIGH (ref 0.50–1.10)
GFR calc Af Amer: 50 mL/min — ABNORMAL LOW (ref >90)
GFR calc non Af Amer: 43 mL/min — ABNORMAL LOW (ref >90)
Glucose, Bld: 106 mg/dL — ABNORMAL HIGH (ref 70–99)
Potassium: 3.7 meq/L (ref 3.5–5.1)
Sodium: 131 meq/L — ABNORMAL LOW (ref 135–145)

## 2013-01-06 LAB — CBC
HCT: 31.2 % — ABNORMAL LOW (ref 36.0–46.0)
Hemoglobin: 10.4 g/dL — ABNORMAL LOW (ref 12.0–15.0)
RBC: 3.45 MIL/uL — ABNORMAL LOW (ref 3.87–5.11)
WBC: 17.2 10*3/uL — ABNORMAL HIGH (ref 4.0–10.5)

## 2013-01-06 LAB — URINE CULTURE

## 2013-01-06 LAB — TROPONIN I
Troponin I: 3.82 ng/mL (ref <0.30)
Troponin I: 2.58 ng/mL (ref <0.30)
Troponin I: 2.12 ng/mL (ref <0.30)

## 2013-01-06 LAB — HEPARIN LEVEL (UNFRACTIONATED)
Heparin Unfractionated: 0.26 IU/mL — ABNORMAL LOW (ref 0.30–0.70)
Heparin Unfractionated: 0.38 IU/mL (ref 0.30–0.70)

## 2013-01-06 LAB — GLUCOSE, CAPILLARY: Glucose-Capillary: 95 mg/dL (ref 70–99)

## 2013-01-06 LAB — CK TOTAL AND CKMB (NOT AT ARMC)
CK, MB: 11.3 ng/mL (ref 0.3–4.0)
Total CK: 51 U/L (ref 7–177)

## 2013-01-06 MED ORDER — GLUCERNA SHAKE PO LIQD
237.0000 mL | Freq: Two times a day (BID) | ORAL | Status: DC
  Administered 2013-01-07 – 2013-01-12 (×3): 237 mL via ORAL
  Filled 2013-01-06 (×4): qty 237

## 2013-01-06 MED ORDER — FUROSEMIDE 20 MG PO TABS
20.0000 mg | ORAL_TABLET | Freq: Every day | ORAL | Status: DC
  Filled 2013-01-06: qty 1

## 2013-01-06 MED ORDER — HEPARIN (PORCINE) IN NACL 100-0.45 UNIT/ML-% IJ SOLN
1950.0000 [IU]/h | INTRAMUSCULAR | Status: DC
  Filled 2013-01-06 (×2): qty 250

## 2013-01-06 MED ORDER — HEPARIN (PORCINE) IN NACL 100-0.45 UNIT/ML-% IJ SOLN
1850.0000 [IU]/h | INTRAMUSCULAR | Status: DC
  Filled 2013-01-06 (×3): qty 250

## 2013-01-06 NOTE — Progress Notes (Signed)
ANTICOAGULATION CONSULT NOTE - Follow Up Consult  Pharmacy Consult for Heparin Indication: chest pain/ACS--NQWMI  No Known Allergies  Patient Measurements: Height: 5\' 8"  (172.7 cm) Weight: 205 lb (92.987 kg) IBW/kg (Calculated) : 63.9 Heparin Dosing Weight:   Vital Signs: Temp: 98.4 F (36.9 C) (02/09 0055) Temp src: Oral (02/09 0055) BP: 99/59 mmHg (02/09 0055) Pulse Rate: 88 (02/09 0055)  Labs:  Recent Labs  01/04/13 1621 01/05/13 0205  01/05/13 1231 01/05/13 1426 01/05/13 1952 01/05/13 2135 01/06/13 0305  HGB 11.8* 10.4*  --   --   --   --   --  10.4*  HCT 35.8* 32.2*  --   --   --   --   --  31.2*  PLT 191 165  --   --   --   --   --  154  HEPARINUNFRC  --   --   --  0.10*  --  0.15*  --  0.26*  CREATININE 0.76 1.05  --   --   --   --   --  1.25*  TROPONINI  --  1.67*  < >  --  4.16*  --  3.77* 3.82*  < > = values in this interval not displayed.  Estimated Creatinine Clearance: 51.3 ml/min (by C-G formula based on Cr of 1.25).   Medications:  Infusions:  . heparin 1,850 Units/hr (01/06/13 0345)  . [DISCONTINUED] heparin 1,000 Units/hr (01/05/13 0430)  . [DISCONTINUED] heparin 1,400 Units/hr (01/05/13 2032)  . [DISCONTINUED] heparin 1,700 Units/hr (01/05/13 2125)    Assessment: Patient with low heparin level.  No issues per RN.  Goal of Therapy:  Heparin level 0.3-0.7 units/ml Monitor platelets by anticoagulation protocol: Yes   Plan:  Increase drip to 1850 units/hr, recheck level at [address], [place]y Kras [place] 01/06/2013,5:41 AM

## 2013-01-06 NOTE — Progress Notes (Signed)
ANTICOAGULATION CONSULT NOTE - Follow Up Consult  Pharmacy Consult for Heparin Indication: NQWMI  No Known Allergies  Patient Measurements: Height: 5\' 8"  (172.7 cm) Weight: 205 lb (92.987 kg) IBW/kg (Calculated) : 63.9 Heparin Dosing Weight: 74kg  Labs:  Recent Labs  01/04/13 1621 01/05/13 0205  01/06/13 0305 01/06/13 0918 01/06/13 1200 01/06/13 1658 01/06/13 1910  HGB 11.8* 10.4*  --  10.4*  --   --   --   --   HCT 35.8* 32.2*  --  31.2*  --   --   --   --   PLT 191 165  --  154  --   --   --   --   HEPARINUNFRC  --   --   < > 0.26*  --  0.38  --  0.27*  CREATININE 0.76 1.05  --  1.25*  --   --   --   --   CKTOTAL  --   --   --   --   --  71 51  --   CKMB  --   --   --   --   --  13.2* 11.3*  --   TROPONINI  --  1.67*  < > 3.82* 2.58* 2.12* 2.81*  --   < > = values in this interval not displayed. Estimated Creatinine Clearance: 51.3 ml/min (by C-G formula based on Cr of 1.25).   Medications:  Infusions:  . heparin 1,850 Units/hr (01/06/13 0345)  . [DISCONTINUED] heparin 1,400 Units/hr (01/05/13 2032)  . [DISCONTINUED] heparin 1,700 Units/hr (01/05/13 2125)    Assessment:  66 yo female s/p kyphoplasty 12/27/12, admitted 2/7 after falling at home.  Developed SVT with ST depression, troponins elevated.  IV heparin started 2/8.  Heparin level therapeutic this morning, but has decreased to slightly subtherapeutic level (0.27)  Goal of Therapy:  Heparin level 0.3-0.7 units/ml Monitor platelets by anticoagulation protocol: Yes   Plan:   Increase to heparin IV infusion at 1950 units/hr  Recheck heparin level in 6hrs  Daily heparin level to begin 01/08/13   Lynann Beaver PharmD, BCPS Pager [phone] 01/06/2013 7:49 PM

## 2013-01-06 NOTE — Progress Notes (Signed)
Nutrition Brief Note  Patient identified on the Malnutrition Screening Tool (MST) Report  Body mass index is 31.18 kg/(m^2). Patient meets criteria for Obesity based on current BMI.   Current diet order is Carb Modified, patient is consuming approximately 50% of meals at this time. Labs and medications reviewed.   Pt reports that she is feeling nauseated and it has makes it difficult for her to eat today.  Will send Glucerna bid. If nutrition issues arise, please consult RD.   Ebbie Latus RD, LDN

## 2013-01-06 NOTE — Progress Notes (Signed)
Subjective:  Sitting up in chair, eating breakfast, feels well, no complaints of chest pain, no shortness of breath.  Objective:  Vital Signs in the last 24 hours: Temp:  [96.6 F (35.9 C)-100.9 F (38.3 C)] 99.3 F (37.4 C) (02/09 0700) Pulse Rate:  [80-133] 80 (02/09 0700) Resp:  [20-26] 20 (02/09 0700) BP: (78-114)/(50-65) 114/59 mmHg (02/09 0700) SpO2:  [97 %-100 %] 97 % (02/09 0700)  Intake/Output from previous day: 02/08 0701 - 02/09 0700 In: 2665 [P.O.:1800; I.V.:315; IV Piggyback:550] Out: 6 [Urine:2; Stool:4]   Physical Exam: General: Well developed, well nourished, in no acute distress. Head:  Normocephalic and atraumatic. Lungs: Clear to auscultation and percussion. Heart: Normal S1 and S2.  No murmur, rubs or gallops. No JVD Abdomen: soft, non-tender, positive bowel sounds. Extremities: No clubbing or cyanosis. No edema. Neurologic: Alert and oriented x 3.    Lab Results:  Recent Labs  01/05/13 0205 01/06/13 0305  WBC 9.7 17.2*  HGB 10.4* 10.4*  PLT 165 154    Recent Labs  01/05/13 0205 01/06/13 0305  NA 136 131*  K 3.1* 3.7  CL 100 99  CO2 19 20  GLUCOSE 166* 106*  BUN 18 30*  CREATININE 1.05 1.25*    Recent Labs  01/05/13 2135 01/06/13 0305  TROPONINI 3.77* 3.82*   Hepatic Function Panel  Recent Labs  01/04/13 1621  PROT 6.5  ALBUMIN 2.7*  AST 11  ALT 10  ALKPHOS 60  BILITOT 0.5     Telemetry: Currently sinus rhythm. Personally viewed.   EKG:  Previous SVT, ST segment depressions noted.  Cardiac Studies:  ECHO - Left ventricle: The cavity size was normal. Systolic function was normal. The estimated ejection fraction was in the range of 55% to 65%. Although no diagnostic regional wall motion abnormality was identified, this possibility cannot be completely excluded on the basis of this study. Doppler parameters are consistent with abnormal left ventricular relaxation (grade 1 diastolic dysfunction). - Mitral valve:  Calcified annulus.    Assessment/Plan:  Principal Problem:   UTI (lower urinary tract infection) Active Problems:   Hyponatremia   Type II or unspecified type diabetes mellitus without mention of complication, not stated as uncontrolled   Hypokalemia   Fever   H/O vertebroplasty   SVT (supraventricular tachycardia)   Non-ST elevation myocardial infarction (NSTEMI)  67 year old with diabetes, fever, urinary infections with elevated troponin up to 4.1 currently 3.82 consistent with non-ST elevation myocardial infarction.  1. Non-ST elevation myocardial infarction-currently on heparin IV. I would continue with heparin today while enzymes are continuing to trend down. Add a CK-MB to her profile. Her diabetes is a coronary artery disease equivalent. She may very well have CAD underlying. It may not be unreasonable to perform diagnostic cardiac catheterization once her medical illness/infection has resolved. Reassuringly, her ejection fraction is normal. She denies any chest discomfort. She denies any prior cardiovascular illness. Her leukocytosis is slightly worse. Certainly her troponin may have been elevated in the setting of his underlying illness/SVT with demand ischemia. Also on aspirin, metoprolol.  2. SVT-currently in sinus rhythm. No further episodes.  3. Leukocytosis/UTI-continuing with antibiotics. Her infection should be cleared before proceeding with diagnostic cardiac catheterization.  4. Diabetes/hyperlipidemia-continue with simvastatin.  [name] 01/06/2013, 8:40 AM

## 2013-01-06 NOTE — Progress Notes (Signed)
01-06-13 NSG:  Troponi is 3.82.  No changes in pt.  Lenny Pastel notified.  Will continue to monitor

## 2013-01-06 NOTE — Progress Notes (Signed)
CRITICAL VALUE ALERT  Critical value received: CK-MB 13.2   Date of notification:  01/06/13  Time of notification:  1243pm   Critical value read back:yes  Nurse who received alert:  I Raphael Espe   MD notified (1st page):  Dr. Izola Price  Time of first page:  1300  MD notified (2nd page):  Time of second page: 1310  Responding MD:  Dr. Izola Price  Time MD responded:  [phone]

## 2013-01-06 NOTE — Progress Notes (Signed)
ANTICOAGULATION CONSULT NOTE - Follow Up Consult  Pharmacy Consult for Heparin Indication: NQWMI  No Known Allergies  Patient Measurements: Height: 5\' 8"  (172.7 cm) Weight: 205 lb (92.987 kg) IBW/kg (Calculated) : 63.9 Heparin Dosing Weight: 74kg  Labs:  Recent Labs  01/04/13 1621 01/05/13 0205  01/05/13 1952  01/06/13 0305 01/06/13 0918 01/06/13 1200  HGB 11.8* 10.4*  --   --   --  10.4*  --   --   HCT 35.8* 32.2*  --   --   --  31.2*  --   --   PLT 191 165  --   --   --  154  --   --   HEPARINUNFRC  --   --   < > 0.15*  --  0.26*  --  0.38  CREATININE 0.76 1.05  --   --   --  1.25*  --   --   CKTOTAL  --   --   --   --   --   --   --  71  CKMB  --   --   --   --   --   --   --  13.2*  TROPONINI  --  1.67*  < >  --   < > 3.82* 2.58* 2.12*  < > = values in this interval not displayed. Estimated Creatinine Clearance: 51.3 ml/min (by C-G formula based on Cr of 1.25).   Medications:  Infusions:  . heparin 1,850 Units/hr (01/06/13 0345)  . [DISCONTINUED] heparin 1,000 Units/hr (01/05/13 0430)  . [DISCONTINUED] heparin 1,400 Units/hr (01/05/13 2032)  . [DISCONTINUED] heparin 1,700 Units/hr (01/05/13 2125)    Assessment:  67 yo female s/p kyphoplasty 12/27/12, admitted 2/7 after falling at home.  Developed SVT with ST depression, troponins elevated.  IV heparin started 2/8.  NQWMI with CAD risk factors, plan is for cardiac cath once acute illness/infection resolves  Heparin level therapeutic (0.38) on 1850 units/hr   CBC stable, no bleeding/complications reported.  Goal of Therapy:  Heparin level 0.3-0.7 units/ml Monitor platelets by anticoagulation protocol: Yes   Plan:   Continue current rate  Recheck heparin level in 6hrs  Daily heparin level to begin 01/07/13   Loralee Pacas, PharmD, BCPS Pager: [phone]  01/06/2013 12:48 PM

## 2013-01-06 NOTE — Progress Notes (Signed)
Patient ID: [name], female   DOB: 05/06/1946, 67 y.o.   MRN: [id number]  [facility] PROGRESS NOTE  [name]:[id number] DOB: Sep 10, 1946 DOA: 01/04/2013 PCP: Johny Blamer, MD  Brief narrative:  Pt is 67 y.o. female with history of diabetes mellitus and hypertension, status post recent kyphoplasty (12/27/2012). She presented to [facility] ED with main concern of difficulty with ambulation at home and as sustained fall after trying to get up from the bed, the afternoon just prior to admission. She called 911 and upon their arrival, pt reports they checked her temperature which was 103.1 F. In ED, UA consistent with UTI and [facility] asked to admit for further evaluation. Upon arrival to the floor, pt developed tachycardia, narrow complex SVT with rate of 200 bpm, elevated troponin 1.67, and mild ST depression in II and aVF. Cardiology consulted.   Principal Problem:  UTI (lower urinary tract infection)  - pt with low grade fever and worsening WBC since yesterday - Urine culture positive for > 100K E.Coli pan sensitive - continue Rocephin day #2 Active Problems:  SVT (supraventricular tachycardia)  - rate better controlled  - continue Metoprolol, appreciate cardio input  NSTEMI - per cardio, 2 D ECHO realtively unremarkable but with grade I diastolic dysfunction - continue to trend troponins and add CK MB, continue heparin per pharmacy Hyponatremia  - likely pre renal etiology and lasix  - drop from yesterday - lower the dose of Lasix 40 mg QD --> 20 mg QD Hypotension  - improved, will continue same regimen with Lasix (lower dose given ARF), Metoprolol Type II or unspecified type diabetes mellitus  - A1C stable, < 6 - continue Lantus and SSI  Hypokalemia  - secondary to Lasix  - supplemented and within normal limits this AM Acute renal failure - most likely secondary to Lasix - will lower the dose to 20 mg QD - BMP in AM Anemia of chronic disease - slight drop in Hg  since yesterday - CBC in AM  Consultants:  Cardiology  Procedures/Studies:  Dg Chest 2 View 01/04/2013  1. No acute cardiopulmonary findings.  2. Chronic bibasilar scarring.  3. Prominent ascending aorta. Query aortic stenosis.  Dg Shoulder Right 01/04/2013  No fracture or dislocation.  Antibiotics:  Rocephin 02/08 -->  Code Status: Full  Family Communication: Pt at bedside  Disposition Plan: Home when medically stable   HPI/Subjective: No events overnight.   Objective: Filed Vitals:   01/05/13 2115 01/06/13 0055 01/06/13 0700 01/06/13 1059  BP: 112/65 99/59 114/59 103/54  Pulse: 133 88 80 84  Temp: 100.9 F (38.3 C) 98.4 F (36.9 C) 99.3 F (37.4 C)   TempSrc: Oral Oral Oral   Resp: 22 20 20    Height:      Weight:      SpO2: 97% 99% 97%     Intake/Output Summary (Last 24 hours) at 01/06/13 1116 Last data filed at 01/06/13 0645  Gross per 24 hour  Intake   2185 ml  Output      5 ml  Net   2180 ml    Exam:   General:  Pt is alert, follows commands appropriately, not in acute distress  Cardiovascular: Regular rhythm, tachycardic, S1/S2, no murmurs, no rubs, no gallops  Respiratory: Clear to auscultation bilaterally, no wheezing, no crackles, no rhonchi  Abdomen: Soft, non tender, non distended, bowel sounds present, no guarding  Extremities: No edema, pulses DP and PT palpable bilaterally  Neuro: Grossly nonfocal  Data  Reviewed: Basic Metabolic Panel:  Recent Labs Lab 01/04/13 1621 01/05/13 0205 01/06/13 0305  NA 133* 136 131*  K 3.1* 3.1* 3.7  CL 96 100 99  CO2 21 19 20   GLUCOSE 186* 166* 106*  BUN 14 18 30*  CREATININE 0.76 1.05 1.25*  CALCIUM 8.8 8.0* 8.0*   Liver Function Tests:  Recent Labs Lab 01/04/13 1621  AST 11  ALT 10  ALKPHOS 60  BILITOT 0.5  PROT 6.5  ALBUMIN 2.7*   CBC:  Recent Labs Lab 01/04/13 1621 01/05/13 0205 01/06/13 0305  WBC 13.8* 9.7 17.2*  NEUTROABS 10.8* 8.6*  --   HGB 11.8* 10.4* 10.4*  HCT 35.8*  32.2* 31.2*  MCV 90.2 91.2 90.4  PLT 191 165 154   Cardiac Enzymes:  Recent Labs Lab 01/05/13 0730 01/05/13 1426 01/05/13 2135 01/06/13 0305 01/06/13 0918  TROPONINI 3.29* 4.16* 3.77* 3.82* 2.58*   CBG:  Recent Labs Lab 01/05/13 0816 01/05/13 1205 01/05/13 1716 01/05/13 2138 01/06/13 0756  GLUCAP 132* 147* 167* 136* 95    Recent Results (from the past 240 hour(s))  CULTURE, BLOOD (ROUTINE X 2)     Status: None   Collection Time    01/04/13  4:21 PM      Result Value Range Status   Specimen Description BLOOD RIGHT ARM   Final   Special Requests BOTTLES DRAWN AEROBIC AND ANAEROBIC 5CC EACH   Final   Culture  Setup Time 01/04/2013 22:30   Final   Culture     Final   Value:        BLOOD CULTURE RECEIVED NO GROWTH TO DATE CULTURE WILL BE HELD FOR 5 DAYS BEFORE ISSUING A FINAL NEGATIVE REPORT   Report Status PENDING   Incomplete  CULTURE, BLOOD (ROUTINE X 2)     Status: None   Collection Time    01/04/13  4:33 PM      Result Value Range Status   Specimen Description BLOOD RIGHT HAND   Final   Special Requests BOTTLES DRAWN AEROBIC AND ANAEROBIC [facility] EACH   Final   Culture  Setup Time 01/04/2013 22:30   Final   Culture     Final   Value:        BLOOD CULTURE RECEIVED NO GROWTH TO DATE CULTURE WILL BE HELD FOR 5 DAYS BEFORE ISSUING A FINAL NEGATIVE REPORT   Report Status PENDING   Incomplete  URINE CULTURE     Status: None   Collection Time    01/04/13  5:46 PM      Result Value Range Status   Specimen Description URINE, CATHETERIZED   Final   Special Requests NONE   Final   Culture  Setup Time 01/05/2013 04:26   Final   Colony Count >=100,000 COLONIES/ML   Final   Culture ESCHERICHIA COLI   Final   Report Status 01/06/2013 FINAL   Final   Organism ID, Bacteria ESCHERICHIA COLI   Final     Scheduled Meds: . aspirin  325 mg Oral Daily  . cefTRIAXone  IV  1 g Intravenous Q0600  . furosemide  40 mg Oral q morning - 10a  . insulin aspart  0-9 Units Subcutaneous TID  WC  . insulin glargine  10 Units Subcutaneous BID  . metoprolol tartrate  25 mg Oral BID  . multivitamin   1 tablet Oral q morning - 10a  . potassium chloride  40 mEq Oral Daily  . simvastatin  40 mg Oral QHS  Continuous Infusions: . heparin 1,850 Units/hr (01/06/13 0345)     Debbora Presto, MD  [facility] Pager [phone]  If 7PM-7AM, please contact night-coverage [email or web address] Password [email or web address] 01/06/2013, 11:16 AM   LOS: 2 days

## 2013-01-07 LAB — CK TOTAL AND CKMB (NOT AT ARMC)
CK, MB: 9 ng/mL (ref 0.3–4.0)
Relative Index: INVALID (ref 0.0–2.5)
Total CK: 40 U/L (ref 7–177)

## 2013-01-07 LAB — CBC
HCT: 30.9 % — ABNORMAL LOW (ref 36.0–46.0)
MCH: 30.1 pg (ref 26.0–34.0)
MCHC: 34 g/dL (ref 30.0–36.0)
MCV: 88.1 fL (ref 78.0–100.0)
MCV: 88.5 fL (ref 78.0–100.0)
Platelets: 164 10*3/uL (ref 150–400)
Platelets: 173 10*3/uL (ref 150–400)
RBC: 3.61 MIL/uL — ABNORMAL LOW (ref 3.87–5.11)
RDW: 13.2 % (ref 11.5–15.5)
RDW: 13.4 % (ref 11.5–15.5)
WBC: 10.1 10*3/uL (ref 4.0–10.5)

## 2013-01-07 LAB — BASIC METABOLIC PANEL
BUN: 29 mg/dL — ABNORMAL HIGH (ref 6–23)
Calcium: 8.3 mg/dL — ABNORMAL LOW (ref 8.4–10.5)
Creatinine, Ser: 0.89 mg/dL (ref 0.50–1.10)
GFR calc Af Amer: 75 mL/min — ABNORMAL LOW (ref >90)
GFR calc non Af Amer: 65 mL/min — ABNORMAL LOW (ref >90)

## 2013-01-07 LAB — GLUCOSE, CAPILLARY
Glucose-Capillary: 113 mg/dL — ABNORMAL HIGH (ref 70–99)
Glucose-Capillary: 102 mg/dL — ABNORMAL HIGH (ref 70–99)
Glucose-Capillary: 112 mg/dL — ABNORMAL HIGH (ref 70–99)

## 2013-01-07 MED ORDER — SODIUM CHLORIDE 0.9 % IV SOLN: 250.0000 mL | INTRAVENOUS | Status: DC | PRN

## 2013-01-07 MED ORDER — HEPARIN (PORCINE) IN NACL 100-0.45 UNIT/ML-% IJ SOLN
2150.0000 [IU]/h | INTRAMUSCULAR | Status: DC
  Filled 2013-01-07 (×2): qty 250

## 2013-01-07 MED ORDER — SODIUM CHLORIDE 0.9 % IJ SOLN: 3.0000 mL | INTRAMUSCULAR | Status: DC | PRN

## 2013-01-07 MED ORDER — DIAZEPAM 5 MG PO TABS
5.0000 mg | ORAL_TABLET | ORAL | Status: AC
  Administered 2013-01-08: 5 mg via ORAL
  Filled 2013-01-07: qty 1

## 2013-01-07 MED ORDER — ASPIRIN 81 MG PO CHEW
324.0000 mg | CHEWABLE_TABLET | ORAL | Status: DC
  Filled 2013-01-07: qty 4

## 2013-01-07 MED ORDER — SODIUM CHLORIDE 0.9 % IV SOLN
1.0000 mL/kg/h | INTRAVENOUS | Status: DC
  Administered 2013-01-08: 1 mL/kg/h via INTRAVENOUS

## 2013-01-07 MED ORDER — SODIUM CHLORIDE 0.9 % IJ SOLN
3.0000 mL | Freq: Two times a day (BID) | INTRAMUSCULAR | Status: DC
  Administered 2013-01-08: 3 mL via INTRAVENOUS

## 2013-01-07 NOTE — Progress Notes (Addendum)
Patient ID: [name], female   DOB: [birth year], 67 y.o.   MRN: [id number] [facility] PROGRESS NOTE  SHALANA JARDIN MVH:[id number] DOB: [birth year] DOA: 01/04/2013 PCP: Johny Blamer, MD  Brief narrative:  Pt is 67 y.o. female with history of diabetes mellitus and hypertension, status post recent kyphoplasty (12/27/2012). She presented to [facility] ED with main concern of difficulty with ambulation at home and as sustained fall after trying to get up from the bed, the afternoon just prior to admission. She called 911 and upon their arrival, pt reports they checked her temperature which was 103.1 F. In ED, UA consistent with UTI and [facility] asked to admit for further evaluation. Upon arrival to the floor, pt developed tachycardia, narrow complex SVT with rate of 200 bpm, elevated troponin 1.67, and mild ST depression in II and aVF. Cardiology consulted.   Principal Problem:  UTI (lower urinary tract infection)  - pt with low grade fever and worsening WBC since yesterday  - Urine culture positive for > 100K E.Coli pan sensitive  - continue Rocephin day #3  Active Problems:  SVT (supraventricular tachycardia)  - rate controlled  - continue Metoprolol, appreciate cardio input  NSTEMI  - per cardio, 2 D ECHO realtively unremarkable but with grade I diastolic dysfunction  - cath planned for tomorrow AM, heparin discontinued due to one episode of rectal bleed Rectal bleed - will discontinue Heparin and will continue to monitor Hyponatremia  - likely pre renal etiology and lasix, stable compared to yesterday   - lowered the dose of Lasix 40 mg QD --> 20 mg QD  Hypotension  - improved, will continue same regimen with Lasix (lower dose given ARF), Metoprolol  Type II or unspecified type diabetes mellitus  - A1C stable, < 6  - continue Lantus and SSI  Hypokalemia  - secondary to Lasix  - supplemented and within normal limits this AM  Acute renal failure  - most likely secondary to Lasix  -  lowered the dose to 20 mg QD  - creatinine is within normal limits this AM - BMP in AM  Anemia of chronic disease  - Hg and Hct stable and at pt's baseline  - CBC in AM   Consultants:  Cardiology  Procedures/Studies:  Dg Chest 2 View 01/04/2013  1. No acute cardiopulmonary findings.  2. Chronic bibasilar scarring.  3. Prominent ascending aorta. Query aortic stenosis.  Dg Shoulder Right 01/04/2013  No fracture or dislocation.  Antibiotics:  Rocephin 02/08 -->   Code Status: Full  Family Communication: Pt at bedside  Disposition Plan: Home when medically stable   HPI/Subjective: No events overnight.   Objective: Filed Vitals:   01/06/13 1500 01/06/13 2015 01/06/13 2134 01/07/13 0535  BP: 124/65 142/62  115/57  Pulse: 80 96  79  Temp: 98.4 F (36.9 C) 98.3 F (36.8 C)  97.6 F (36.4 C)  TempSrc: Oral Oral  Oral  Resp: 20 18  20   Height:      Weight:      SpO2: 96% 97% 93% 97%    Intake/Output Summary (Last 24 hours) at 01/07/13 0935 Last data filed at 01/07/13 0600  Gross per 24 hour  Intake   2308 ml  Output    300 ml  Net   2008 ml    Exam:   General:  Pt is alert, follows commands appropriately, not in acute distress  Cardiovascular: Regular rate and rhythm, S1/S2, no murmurs, no rubs, no gallops  Respiratory: Clear  to auscultation bilaterally, no wheezing, no crackles, no rhonchi  Abdomen: Soft, non tender, non distended, bowel sounds present, no guarding  Extremities: No edema, pulses DP and PT palpable bilaterally  Neuro: Grossly nonfocal  Data Reviewed: Basic Metabolic Panel:  Recent Labs Lab 01/04/13 1621 01/05/13 0205 01/06/13 0305 01/07/13 0215  NA 133* 136 131* 131*  K 3.1* 3.1* 3.7 3.8  CL 96 100 99 99  CO2 21 19 20 19   GLUCOSE 186* 166* 106* 122*  BUN 14 18 30* 29*  CREATININE 0.76 1.05 1.25* 0.89  CALCIUM 8.8 8.0* 8.0* 8.3*   Liver Function Tests:  Recent Labs Lab 01/04/13 1621  AST 11  ALT 10  ALKPHOS 60  BILITOT 0.5   PROT 6.5  ALBUMIN 2.7*   CBC:  Recent Labs Lab 01/04/13 1621 01/05/13 0205 01/06/13 0305 01/07/13 0215  WBC 13.8* 9.7 17.2* 14.1*  NEUTROABS 10.8* 8.6*  --   --   HGB 11.8* 10.4* 10.4* 10.5*  HCT 35.8* 32.2* 31.2* 30.9*  MCV 90.2 91.2 90.4 88.5  PLT 191 165 154 173   Cardiac Enzymes:  Recent Labs Lab 01/06/13 0305 01/06/13 0918 01/06/13 1200 01/06/13 1658 01/06/13 2330  CKTOTAL  --   --  71 51 40  CKMB  --   --  13.2* 11.3* 9.0*  TROPONINI 3.82* 2.58* 2.12* 2.81* 2.78*   CBG:  Recent Labs Lab 01/05/13 2138 01/06/13 0756 01/06/13 1205 01/06/13 1729 01/07/13 0757  GLUCAP 136* 95 113* 116* 113*    Recent Results (from the past 240 hour(s))  CULTURE, BLOOD (ROUTINE X 2)     Status: None   Collection Time    01/04/13  4:21 PM      Result Value Range Status   Specimen Description BLOOD RIGHT ARM   Final   Special Requests BOTTLES DRAWN AEROBIC AND ANAEROBIC 5CC EACH   Final   Culture  Setup Time 01/04/2013 22:30   Final   Culture     Final   Value:        BLOOD CULTURE RECEIVED NO GROWTH TO DATE CULTURE WILL BE HELD FOR 5 DAYS BEFORE ISSUING A FINAL NEGATIVE REPORT   Report Status PENDING   Incomplete  CULTURE, BLOOD (ROUTINE X 2)     Status: None   Collection Time    01/04/13  4:33 PM      Result Value Range Status   Specimen Description BLOOD RIGHT HAND   Final   Special Requests BOTTLES DRAWN AEROBIC AND ANAEROBIC [place] EACH   Final   Culture  Setup Time 01/04/2013 22:30   Final   Culture     Final   Value:        BLOOD CULTURE RECEIVED NO GROWTH TO DATE CULTURE WILL BE HELD FOR 5 DAYS BEFORE ISSUING A FINAL NEGATIVE REPORT   Report Status PENDING   Incomplete  URINE CULTURE     Status: None   Collection Time    01/04/13  5:46 PM      Result Value Range Status   Specimen Description URINE, CATHETERIZED   Final   Special Requests NONE   Final   Culture  Setup Time 01/05/2013 04:26   Final   Colony Count >=100,000 COLONIES/ML   Final   Culture  ESCHERICHIA COLI   Final   Report Status 01/06/2013 FINAL   Final   Organism ID, Bacteria ESCHERICHIA COLI   Final     Scheduled Meds: . aspirin  325 mg Oral Daily  .  cefTRIAXone (ROCEPHIN)  IV  1 g Intravenous Q0600  . feeding supplement  237 mL Oral BID BM  . insulin aspart  0-9 Units Subcutaneous TID WC  . insulin glargine  10 Units Subcutaneous BID  . metoprolol tartrate  25 mg Oral BID  . multivitamin with minerals  1 tablet Oral q morning - 10a  . potassium chloride  40 mEq Oral Daily  . simvastatin  40 mg Oral QHS  . sodium chloride  3 mL Intravenous Q12H   Continuous Infusions:    Debbora Presto, MD  [facility] Pager [phone]  If 7PM-7AM, please contact night-coverage [email or web address] Password [email or web address] 01/07/2013, 9:35 AM   LOS: 3 days

## 2013-01-07 NOTE — Progress Notes (Signed)
  Pharmacy Note (Brief) - D/C IV Heparin  Pt currently has IV heparin running at 2150 units/hr for NSTEMI. Night shift RN reported blood in the stool at 7am this morning. Pt has a history of hemorrhoids. Per patient report, she has never experienced bleeding with her hemorrhoids. Patient describes the blood content as "not a ton, but clearly present". Night RN reportedly told previous pharmacist something similar. Discussed this with cardiologist, Dr. Jens Som, who gave v.o. To d/c this IV heparin and not restart it. MD does not want Lovenox 40mg  or SQ heparin started either.  Plan: 1) D/C heparin (relayed this info to RN) - v.o. Dr. Jens Som 2) D/C heparin labs 3) No further anticoagulant orders given at this time. 4) Pharmacy will sign off heparin monitoring.  Darrol Angel, PharmD Pager: [phone] 01/07/2013 8:19 AM

## 2013-01-07 NOTE — Progress Notes (Signed)
Subjective:  No complaints of chest pain, no shortness of breath.  Objective:  Vital Signs in the last 24 hours: Temp:  [97.6 F (36.4 C)-99.3 F (37.4 C)] 97.6 F (36.4 C) (02/10 0535) Pulse Rate:  [79-96] 79 (02/10 0535) Resp:  [18-20] 20 (02/10 0535) BP: (103-142)/(54-65) 115/57 mmHg (02/10 0535) SpO2:  [93 %-97 %] 97 % (02/10 0535)  Intake/Output from previous day: 02/09 0701 - 02/10 0700 In: 2308 [P.O.:1920; I.V.:388] Out: 300 [Urine:300]   Physical Exam: General: Well developed, well nourished, in no acute distress. Head:  Normocephalic and atraumatic. Lungs: Clear to auscultation and percussion. Heart:RRR Abdomen: soft, non-tender, positive bowel sounds. Extremities: No edema. Neurologic: Alert and oriented x 3.    Lab Results:  Recent Labs  01/06/13 0305 01/07/13 0215  WBC 17.2* 14.1*  HGB 10.4* 10.5*  PLT 154 173    Recent Labs  01/06/13 0305 01/07/13 0215  NA 131* 131*  K 3.7 3.8  CL 99 99  CO2 20 19  GLUCOSE 106* 122*  BUN 30* 29*  CREATININE 1.25* 0.89    Recent Labs  01/06/13 1658 01/06/13 2330  TROPONINI 2.81* 2.78*   Hepatic Function Panel  Recent Labs  01/04/13 1621  PROT 6.5  ALBUMIN 2.7*  AST 11  ALT 10  ALKPHOS 60  BILITOT 0.5     Telemetry: Currently sinus rhythm.     Cardiac Studies:  ECHO - Left ventricle: The cavity size was normal. Systolic function was normal. The estimated ejection fraction was in the range of 55% to 65%. Although no diagnostic regional wall motion abnormality was identified, this possibility cannot be completely excluded on the basis of this study. Doppler parameters are consistent with abnormal left ventricular relaxation (grade 1 diastolic dysfunction). - Mitral valve: Calcified annulus.    Assessment/Plan:  Principal Problem:   UTI (lower urinary tract infection) Active Problems:   Hyponatremia   Type II or unspecified type diabetes mellitus without mention of complication,  not stated as uncontrolled   Hypokalemia   Fever   H/O vertebroplasty   SVT (supraventricular tachycardia)   Non-ST elevation myocardial infarction (NSTEMI)   1. Non-ST elevation myocardial infarction-Continue ASA, lopressor and statin; cath tomorrow (risks and benefits discussed and patient agrees to proceed). Hold lasix prior to procedure and recheck BMET in AM.  2. SVT-currently in sinus rhythm. No further episodes. Continue lopressor.  3. Leukocytosis/UTI-continuing with antibiotics.   4. Diabetes/hyperlipidemia-continue with simvastatin.  Olga Millers 01/07/2013, 6:54 AM

## 2013-01-07 NOTE — Progress Notes (Signed)
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 notified, she acknowledges information, and states she will talk to another [facility] and will be decreainge the amount of heparin and to look for the order soon - day rn Iffy informed and will be looking for new order.

## 2013-01-07 NOTE — Progress Notes (Signed)
ANTICOAGULATION CONSULT NOTE - Follow Up Consult  Pharmacy Consult for heparin Indication: chest pain/ACS  No Known Allergies  Patient Measurements: Height: 5\' 8"  (172.7 cm) Weight: 205 lb (92.987 kg) IBW/kg (Calculated) : 63.9 Heparin Dosing Weight:   Vital Signs: Temp: 98.3 F (36.8 C) (02/09 2015) Temp src: Oral (02/09 2015) BP: 142/62 mmHg (02/09 2015) Pulse Rate: 96 (02/09 2015)  Labs:  Recent Labs  01/05/13 0205  01/06/13 0305  01/06/13 1200 01/06/13 1658 01/06/13 1910 01/06/13 2330 01/07/13 0215  HGB 10.4*  --  10.4*  --   --   --   --   --  10.5*  HCT 32.2*  --  31.2*  --   --   --   --   --  30.9*  PLT 165  --  154  --   --   --   --   --  173  HEPARINUNFRC  --   < > 0.26*  --  0.38  --  0.27*  --  0.18*  CREATININE 1.05  --  1.25*  --   --   --   --   --  0.89  CKTOTAL  --   --   --   --  71 51  --  40  --   CKMB  --   --   --   --  13.2* 11.3*  --  9.0*  --   TROPONINI 1.67*  < > 3.82*  < > 2.12* 2.81*  --  2.78*  --   < > = values in this interval not displayed.  Estimated Creatinine Clearance: 72.1 ml/min (by C-G formula based on Cr of 0.89).   Medications:  Infusions:  . heparin    . [DISCONTINUED] heparin 1,850 Units/hr (01/06/13 0345)  . [DISCONTINUED] heparin 1,950 Units/hr (01/06/13 2030)    Assessment: Patient with low heparin level again after rate increase.  No issues with drip per RN.  Goal of Therapy:  Heparin level 0.3-0.7 units/ml Monitor platelets by anticoagulation protocol: Yes   Plan:  Increase heparin to 2150 units/hr, recheck level at 1100am  Darlina Guys, Jacquenette Shone Crowford 01/07/2013,5:02 AM

## 2013-01-07 NOTE — Evaluation (Addendum)
Physical Therapy Evaluation Patient Details Name: [name] MRN: [id number] DOB: Apr 05, 1946 Today's Date: 01/07/2013 Time: 4098-1191 PT Time Calculation (min): 22 min  PT Assessment / Plan / Recommendation Clinical Impression  Pt presents with UTI and NSTEMI with recent history of kyphoplasty on 1/30 at [facility] and a fall at home (hit head on plastic trashcan).  Tolerated OOB to 3in1 then ambulation in hallway, however is very talkative and needs frequent re-direction during session.  Also note that she is requiring min assist at this time for ambulation.  Pt will benefit from skilled PT in acute venue to address deficits.  PT recommends ST SNF vs HHPT pending pt progress in hospital.     PT Assessment  Patient needs continued PT services    Follow Up Recommendations  Home health PT;SNF;Supervision/Assistance - 24 hour    Does the patient have the potential to tolerate intense rehabilitation      Barriers to Discharge None      Equipment Recommendations  None recommended by PT    Recommendations for Other Services OT consult   Frequency Min 3X/week    Precautions / Restrictions Precautions Precautions: Fall Precaution Comments: recent kyphoplasty in Jan.  Restrictions Weight Bearing Restrictions: No   Pertinent Vitals/Pain No pain      Mobility  Bed Mobility Bed Mobility: Supine to Sit Supine to Sit: 4: Min assist Details for Bed Mobility Assistance: Some assist for trunk to get into sitting position with cues for hand placement and adjusting hips to get B feet on floor.  Transfers Transfers: Sit to Stand;Stand to Sit Sit to Stand: 4: Min assist;From elevated surface;With upper extremity assist;From bed Stand to Sit: 4: Min assist;With upper extremity assist;With armrests;To chair/3-in-1 Details for Transfer Assistance: performed x 3 in order to 3in1 (x2).  Assist to rise, steady and ensure controlled descent with cues for hand placement and safety.   Ambulation/Gait Ambulation/Gait Assistance: 4: Min assist Ambulation Distance (Feet): 100 Feet Assistive device: Rolling walker Ambulation/Gait Assistance Details: Assist to steady throughout with max cues for maintaining position inside of RW, upright posture and decreased gait speed for safety.  Gait Pattern: Step-through pattern;Decreased stride length;Trunk flexed Gait velocity: somewhat increased initially Stairs: No Wheelchair Mobility Wheelchair Mobility: No    Exercises     PT Diagnosis: Difficulty walking;Generalized weakness  PT Problem List: Decreased strength;Decreased activity tolerance;Decreased balance;Decreased mobility;Decreased coordination;Decreased safety awareness;Decreased knowledge of precautions;Decreased knowledge of use of DME PT Treatment Interventions: DME instruction;Gait training;Functional mobility training;Therapeutic activities;Therapeutic exercise;Balance training;Patient/family education   PT Goals Acute Rehab PT Goals PT Goal Formulation: With patient Time For Goal Achievement: 01/21/13 Potential to Achieve Goals: Good Pt will go Supine/Side to Sit: with supervision PT Goal: Supine/Side to Sit - Progress: Goal set today Pt will go Sit to Supine/Side: with supervision PT Goal: Sit to Supine/Side - Progress: Goal set today Pt will go Sit to Stand: with supervision PT Goal: Sit to Stand - Progress: Goal set today Pt will go Stand to Sit: with supervision PT Goal: Stand to Sit - Progress: Goal set today Pt will Ambulate: 51 - 150 feet;with supervision;with least restrictive assistive device PT Goal: Ambulate - Progress: Other (comment) (Will set if D/C home. )  Visit Information  Last PT Received On: 01/07/13 Assistance Needed: +2 (safety)    Subjective Data  Subjective: I need to use that pot first.  Patient Stated Goal: to return home with husband.    Prior Functioning  Home Living Lives With: Spouse Available  Help at Discharge:  Family;Available 24 hours/day Type of Home: House Home Layout: One level Home Adaptive Equipment: Walker - rolling Prior Function Level of Independence: Independent Able to Take Stairs?: Yes Driving: Yes Vocation: Retired Musician: No difficulties    Copywriter, advertising Overall Cognitive Status: Impaired Area of Impairment: Following commands;Safety/judgement;Awareness of errors Arousal/Alertness: Awake/alert Orientation Level: Appears intact for tasks assessed Behavior During Session: [facility] for tasks performed Following Commands: Follows one step commands inconsistently Safety/Judgement: Decreased awareness of safety precautions;Impulsive;Decreased safety judgement for tasks assessed Awareness of Errors: Assistance required to identify errors made;Assistance required to correct errors made    Extremity/Trunk Assessment Right Lower Extremity Assessment RLE ROM/Strength/Tone: Deficits RLE ROM/Strength/Tone Deficits: Grossly 3+/5 per functional assessment, also noted increased swelling in LEs.  RLE Sensation: WFL - Light Touch Left Lower Extremity Assessment LLE ROM/Strength/Tone: Deficits LLE ROM/Strength/Tone Deficits: Grossly 3+/5 per functional assessment, also noted increased swelling in LEs.  LLE Sensation: WFL - Light Touch Trunk Assessment Trunk Assessment: Kyphotic   Balance    End of Session PT - End of Session Equipment Utilized During Treatment: Gait belt Activity Tolerance: Patient limited by fatigue Patient left: in chair;with call bell/phone within reach Nurse Communication: Mobility status  GP     Vista Deck 01/07/2013, 3:54 PM

## 2013-01-08 ENCOUNTER — Encounter (HOSPITAL_COMMUNITY)
Admission: EM | Disposition: A | Discharge status: Home Health | Payer: Self-pay | Source: Home / Self Care | Attending: Internal Medicine

## 2013-01-08 DIAGNOSIS — I2510 Atherosclerotic heart disease of native coronary artery without angina pectoris: Secondary | ICD-10-CM

## 2013-01-08 DIAGNOSIS — K921 Melena: Secondary | ICD-10-CM

## 2013-01-08 HISTORY — PX: LEFT HEART CATHETERIZATION WITH CORONARY ANGIOGRAM: SHX5451

## 2013-01-08 LAB — BASIC METABOLIC PANEL
CO2: 22 mEq/L (ref 19–32)
Calcium: 8.5 mg/dL (ref 8.4–10.5)
Creatinine, Ser: 0.74 mg/dL (ref 0.50–1.10)
GFR calc non Af Amer: 85 mL/min — ABNORMAL LOW (ref >90)
Sodium: 134 mEq/L — ABNORMAL LOW (ref 135–145)

## 2013-01-08 LAB — GLUCOSE, CAPILLARY
Glucose-Capillary: 103 mg/dL — ABNORMAL HIGH (ref 70–99)
Glucose-Capillary: 90 mg/dL (ref 70–99)
Glucose-Capillary: 82 mg/dL (ref 70–99)

## 2013-01-08 SURGERY — LEFT HEART CATHETERIZATION WITH CORONARY ANGIOGRAM
Anesthesia: LOCAL

## 2013-01-08 MED ORDER — ACETAMINOPHEN 325 MG PO TABS: 650.0000 mg | ORAL_TABLET | ORAL | Status: DC | PRN

## 2013-01-08 MED ORDER — HEPARIN SODIUM (PORCINE) 1000 UNIT/ML IJ SOLN
INTRAMUSCULAR | Status: AC
  Filled 2013-01-08: qty 1

## 2013-01-08 MED ORDER — HEPARIN (PORCINE) IN NACL 2-0.9 UNIT/ML-% IJ SOLN
INTRAMUSCULAR | Status: AC
Start: 2013-01-08 — End: 2013-01-08
  Filled 2013-01-08: qty 1000

## 2013-01-08 MED ORDER — ONDANSETRON HCL 4 MG/2ML IJ SOLN: 4.0000 mg | Freq: Four times a day (QID) | INTRAMUSCULAR | Status: DC | PRN

## 2013-01-08 MED ORDER — LIDOCAINE HCL (PF) 1 % IJ SOLN
INTRAMUSCULAR | Status: AC
  Filled 2013-01-08: qty 30

## 2013-01-08 MED ORDER — MIDAZOLAM HCL 2 MG/2ML IJ SOLN
INTRAMUSCULAR | Status: AC
  Filled 2013-01-08: qty 2

## 2013-01-08 MED ORDER — VERAPAMIL HCL 2.5 MG/ML IV SOLN
INTRAVENOUS | Status: AC
  Filled 2013-01-08: qty 2

## 2013-01-08 MED ORDER — SODIUM CHLORIDE 0.9 % IV SOLN
INTRAVENOUS | Status: DC
  Administered 2013-01-08: 250 mL via INTRAVENOUS
  Administered 2013-01-09: 07:00:00 via INTRAVENOUS

## 2013-01-08 MED ORDER — VITAMINS A & D EX OINT
TOPICAL_OINTMENT | CUTANEOUS | Status: AC
  Administered 2013-01-08: 5
  Filled 2013-01-08: qty 5

## 2013-01-08 MED ORDER — FENTANYL CITRATE 0.05 MG/ML IJ SOLN
INTRAMUSCULAR | Status: AC
  Filled 2013-01-08: qty 2

## 2013-01-08 NOTE — Progress Notes (Signed)
Subjective:  No complaints of chest pain, no shortness of breath. Hematocheezia yesterday; none today; she attributes to hemorrhoids.  Objective:  Vital Signs in the last 24 hours: Temp:  [97.7 F (36.5 C)-99.1 F (37.3 C)] 97.7 F (36.5 C) (02/11 0528) Pulse Rate:  [79-89] 87 (02/11 0528) Resp:  [20-22] 20 (02/11 0528) BP: (137-146)/(63-88) 145/88 mmHg (02/11 0528) SpO2:  [94 %-97 %] 95 % (02/11 0528) Weight:  [203 lb 9.6 oz (92.352 kg)] 203 lb 9.6 oz (92.352 kg) (02/11 0500)  Intake/Output from previous day: 02/10 0701 - 02/11 0700 In: 340 [P.O.:240; IV Piggyback:100] Out: -    Physical Exam: General: Well developed, well nourished, in no acute distress. Head:  Normocephalic and atraumatic. Lungs: Clear to auscultation and percussion. Heart:RRR Abdomen: soft, non-tender, positive bowel sounds. Extremities: No edema. Neurologic: Alert and oriented x 3.    Lab Results:  Recent Labs  01/07/13 0215 01/07/13 2340  WBC 14.1* 10.1  HGB 10.5* 10.8*  PLT 173 164    Recent Labs  01/07/13 0215 01/07/13 2340  NA 131* 134*  K 3.8 4.0  CL 99 101  CO2 19 22  GLUCOSE 122* 131*  BUN 29* 24*  CREATININE 0.89 0.74    Recent Labs  01/06/13 1658 01/06/13 2330  TROPONINI 2.81* 2.78*     Telemetry: Currently sinus rhythm.     Cardiac Studies:  ECHO - Left ventricle: The cavity size was normal. Systolic function was normal. The estimated ejection fraction was in the range of 55% to 65%. Although no diagnostic regional wall motion abnormality was identified, this possibility cannot be completely excluded on the basis of this study. Doppler parameters are consistent with abnormal left ventricular relaxation (grade 1 diastolic dysfunction). - Mitral valve: Calcified annulus.    Assessment/Plan:  1. Non-ST elevation myocardial infarction-Continue ASA, lopressor and statin; cath today (risks and benefits discussed and patient agrees to proceed). Hold lasix prior  to procedure. Note patient had hematochezia yesterday that she attributes to hemorrhoids. None today and Hgb unchanged. If PCI required, would consider BMS.  2. SVT-currently in sinus rhythm. No further episodes. Continue lopressor.  3. Leukocytosis/UTI-continuing with antibiotics.   4. Diabetes/hyperlipidemia-continue with simvastatin.  5. Hematochezia - Hgb unchanged; will need GI eval after cardiology w/u complete; note MCV normal.  [name] 01/08/2013, 6:25 AM      

## 2013-01-08 NOTE — H&P (View-Only) (Signed)
Subjective:  No complaints of chest pain, no shortness of breath. Hematocheezia yesterday; none today; she attributes to hemorrhoids.  Objective:  Vital Signs in the last 24 hours: Temp:  [97.7 F (36.5 C)-99.1 F (37.3 C)] 97.7 F (36.5 C) (02/11 0528) Pulse Rate:  [79-89] 87 (02/11 0528) Resp:  [20-22] 20 (02/11 0528) BP: (137-146)/(63-88) 145/88 mmHg (02/11 0528) SpO2:  [94 %-97 %] 95 % (02/11 0528) Weight:  [203 lb 9.6 oz (92.352 kg)] 203 lb 9.6 oz (92.352 kg) (02/11 0500)  Intake/Output from previous day: 02/10 0701 - 02/11 0700 In: 340 [P.O.:240; IV Piggyback:100] Out: -    Physical Exam: General: Well developed, well nourished, in no acute distress. Head:  Normocephalic and atraumatic. Lungs: Clear to auscultation and percussion. Heart:RRR Abdomen: soft, non-tender, positive bowel sounds. Extremities: No edema. Neurologic: Alert and oriented x 3.    Lab Results:  Recent Labs  01/07/13 0215 01/07/13 2340  WBC 14.1* 10.1  HGB 10.5* 10.8*  PLT 173 164    Recent Labs  01/07/13 0215 01/07/13 2340  NA 131* 134*  K 3.8 4.0  CL 99 101  CO2 19 22  GLUCOSE 122* 131*  BUN 29* 24*  CREATININE 0.89 0.74    Recent Labs  01/06/13 1658 01/06/13 2330  TROPONINI 2.81* 2.78*     Telemetry: Currently sinus rhythm.     Cardiac Studies:  ECHO - Left ventricle: The cavity size was normal. Systolic function was normal. The estimated ejection fraction was in the range of 55% to 65%. Although no diagnostic regional wall motion abnormality was identified, this possibility cannot be completely excluded on the basis of this study. Doppler parameters are consistent with abnormal left ventricular relaxation (grade 1 diastolic dysfunction). - Mitral valve: Calcified annulus.    Assessment/Plan:  1. Non-ST elevation myocardial infarction-Continue ASA, lopressor and statin; cath today (risks and benefits discussed and patient agrees to proceed). Hold lasix prior  to procedure. Note patient had hematochezia yesterday that she attributes to hemorrhoids. None today and Hgb unchanged. If PCI required, would consider BMS.  2. SVT-currently in sinus rhythm. No further episodes. Continue lopressor.  3. Leukocytosis/UTI-continuing with antibiotics.   4. Diabetes/hyperlipidemia-continue with simvastatin.  5. Hematochezia - Hgb unchanged; will need GI eval after cardiology w/u complete; note MCV normal.  Olga Millers 01/08/2013, 6:25 AM

## 2013-01-08 NOTE — Progress Notes (Signed)
Patient ID: [name], female   DOB: 02-Jun-1946, 67 y.o.   MRN: [id number]  [facility] PROGRESS NOTE  [name] NFA:[id number] DOB: 12/15/1945 DOA: 01/04/2013 PCP: Johny Blamer, MD  Brief narrative:  Pt is 67 y.o. female with history of diabetes mellitus and hypertension, status post recent kyphoplasty (12/27/2012). She presented to [facility] ED with main concern of difficulty with ambulation at home and as sustained fall after trying to get up from the bed, the afternoon just prior to admission. She called 911 and upon their arrival, pt reports they checked her temperature which was 103.1 F. In ED, UA consistent with UTI and [facility] asked to admit for further evaluation. Upon arrival to the floor, pt developed tachycardia, narrow complex SVT with rate of 200 bpm, elevated troponin 1.67, and mild ST depression in II and aVF. Cardiology consulted.   Principal Problem:  UTI (lower urinary tract infection)  - Urine culture positive for > 100K E.Coli pan sensitive  - continue Rocephin day #4/5 - pt remains afebrile over 48 hours and WBC is now within normal limits this AM Active Problems:  SVT (supraventricular tachycardia)  - rate controlled  - continue Metoprolol, appreciate cardio input  NSTEMI  - per cardio, 2 D ECHO realtively unremarkable but with grade I diastolic dysfunction  - cath planned for tomorrow today, heparin discontinued due to one episode of rectal bleed  Rectal bleed  - possibly related to hemorrhoidal bleed - if Hg drops further, will consider GI consultation  - CBC in AM Hyponatremia  - likely pre renal etiology and lasix, within normal limits  - lowered the dose of Lasix 40 mg QD --> 20 mg QD (02/10) - will hold dose today prior to cath 02/11 - BMP in AM Hypotension  - improved, will continue same regimen with Lasix after the cath and Metoprolol  Type II or unspecified type diabetes mellitus  - A1C stable, < 6  - continue Lantus and SSI  Hypokalemia  -  secondary to Lasix  - supplemented and within normal limits this AM  Acute renal failure  - most likely secondary to Lasix, dose was lowered as noted above - creatinine is within normal limits this AM  - BMP in AM  Anemia of chronic disease  - Hg and Hct stable and at pt's baseline  - CBC in AM   Consultants:  Cardiology  Procedures/Studies:  Dg Chest 2 View 01/04/2013  1. No acute cardiopulmonary findings.  2. Chronic bibasilar scarring.  3. Prominent ascending aorta. Query aortic stenosis.  Dg Shoulder Right 01/04/2013  No fracture or dislocation.  Antibiotics:  Rocephin 02/08 -->  Code Status: Full  Family Communication: Pt at bedside  Disposition Plan: Home likely in Am, based on cath findings   HPI/Subjective: No events overnight. Pt denies chest pain or shortness of breath this AM.   Objective: Filed Vitals:   01/07/13 1508 01/07/13 2145 01/08/13 0500 01/08/13 0528  BP: 137/72 146/63  145/88  Pulse: 79 89  87  Temp: 98 F (36.7 C) 99.1 F (37.3 C)  97.7 F (36.5 C)  TempSrc: Oral Oral  Oral  Resp: 20 22  20   Height:      Weight:   92.352 kg (203 lb 9.6 oz)   SpO2: 97% 94%  95%    Intake/Output Summary (Last 24 hours) at 01/08/13 1150 Last data filed at 01/08/13 0530  Gross per 24 hour  Intake    340 ml  Output  0 ml  Net    340 ml    Exam:   General:  Pt is alert, follows commands appropriately, not in acute distress  Cardiovascular: Regular rate and rhythm, S1/S2, no murmurs, no rubs, no gallops  Respiratory: Clear to auscultation bilaterally, no wheezing, no crackles, no rhonchi  Abdomen: Soft, non tender, non distended, bowel sounds present, no guarding  Extremities: No edema, pulses DP and PT palpable bilaterally  Neuro: Grossly nonfocal  Data Reviewed: Basic Metabolic Panel:  Recent Labs Lab 01/04/13 1621 01/05/13 0205 01/06/13 0305 01/07/13 0215 01/07/13 2340  NA 133* 136 131* 131* 134*  K 3.1* 3.1* 3.7 3.8 4.0  CL 96 100 99  99 101  CO2 21 19 20 19 22   GLUCOSE 186* 166* 106* 122* 131*  BUN 14 18 30* 29* 24*  CREATININE 0.76 1.05 1.25* 0.89 0.74  CALCIUM 8.8 8.0* 8.0* 8.3* 8.5   Liver Function Tests:  Recent Labs Lab 01/04/13 1621  AST 11  ALT 10  ALKPHOS 60  BILITOT 0.5  PROT 6.5  ALBUMIN 2.7*   CBC:  Recent Labs Lab 01/04/13 1621 01/05/13 0205 01/06/13 0305 01/07/13 0215 01/07/13 2340  WBC 13.8* 9.7 17.2* 14.1* 10.1  NEUTROABS 10.8* 8.6*  --   --   --   HGB 11.8* 10.4* 10.4* 10.5* 10.8*  HCT 35.8* 32.2* 31.2* 30.9* 31.8*  MCV 90.2 91.2 90.4 88.5 88.1  PLT 191 165 154 173 164   Cardiac Enzymes:  Recent Labs Lab 01/06/13 0305 01/06/13 0918 01/06/13 1200 01/06/13 1658 01/06/13 2330  CKTOTAL  --   --  71 51 40  CKMB  --   --  13.2* 11.3* 9.0*  TROPONINI 3.82* 2.58* 2.12* 2.81* 2.78*   BNP: No components found with this basename: POCBNP,  CBG:  Recent Labs Lab 01/07/13 0757 01/07/13 1200 01/07/13 1720 01/07/13 2209 01/08/13 0755  GLUCAP 113* 133* 102* 112* 103*    Recent Results (from the past 240 hour(s))  CULTURE, BLOOD (ROUTINE X 2)     Status: None   Collection Time    01/04/13  4:21 PM      Result Value Range Status   Specimen Description BLOOD RIGHT ARM   Final   Special Requests BOTTLES DRAWN AEROBIC AND ANAEROBIC 5CC EACH   Final   Culture  Setup Time 01/04/2013 22:30   Final   Culture     Final   Value:        BLOOD CULTURE RECEIVED NO GROWTH TO DATE CULTURE WILL BE HELD FOR 5 DAYS BEFORE ISSUING A FINAL NEGATIVE REPORT   Report Status PENDING   Incomplete  CULTURE, BLOOD (ROUTINE X 2)     Status: None   Collection Time    01/04/13  4:33 PM      Result Value Range Status   Specimen Description BLOOD RIGHT HAND   Final   Special Requests BOTTLES DRAWN AEROBIC AND ANAEROBIC [facility] EACH   Final   Culture  Setup Time 01/04/2013 22:30   Final   Culture     Final   Value:        BLOOD CULTURE RECEIVED NO GROWTH TO DATE CULTURE WILL BE HELD FOR 5 DAYS BEFORE  ISSUING A FINAL NEGATIVE REPORT   Report Status PENDING   Incomplete  URINE CULTURE     Status: None   Collection Time    01/04/13  5:46 PM      Result Value Range Status   Specimen Description URINE,  CATHETERIZED   Final   Special Requests NONE   Final   Culture  Setup Time 01/05/2013 04:26   Final   Colony Count >=100,000 COLONIES/ML   Final   Culture ESCHERICHIA COLI   Final   Report Status 01/06/2013 FINAL   Final   Organism ID, Bacteria ESCHERICHIA COLI   Final     Scheduled Meds: . aspirin  324 mg Oral Pre-Cath  . aspirin  325 mg Oral Daily  . cefTRIAXone IV  1 g Intravenous Q0600  . diazepam  5 mg Oral On Call  . feeding supplement  237 mL Oral BID BM  . insulin aspart  0-9 Units Subcutaneous TID WC  . insulin glargine  10 Units Subcutaneous BID  . metoprolol tartrate  25 mg Oral BID  . multivitamin   1 tablet Oral q morning - 10a  . simvastatin  40 mg Oral QHS   Continuous Infusions: . sodium chloride 1 mL/kg/hr (01/08/13 0550)     Debbora Presto, MD  [facility] Pager [phone]  If 7PM-7AM, please contact night-coverage [email or web address] Password [facility] 01/08/2013, 11:50 AM   LOS: 4 days

## 2013-01-08 NOTE — Interval H&P Note (Signed)
History and Physical Interval Note:  01/08/2013 5:48 PM  [name]  has presented today for surgery, with the diagnosis of Chest pain  The various methods of treatment have been discussed with the patient and family. After consideration of risks, benefits and other options for treatment, the patient has consented to  Procedure(s): LEFT HEART CATHETERIZATION WITH CORONARY ANGIOGRAM (N/A) as a surgical intervention .  The patient's history has been reviewed, patient examined, no change in status, stable for surgery.  I have reviewed the patient's chart and labs.  Questions were answered to the patient's satisfaction.     Elyn Aquas.

## 2013-01-08 NOTE — CV Procedure (Signed)
    Cardiac Cath Note  [name] [id number] 08/23/1946  Procedure: left  Heart Cardiac Catheterization Note Indications: chest pain, NSTEMI  Procedure Details Consent: Obtained Time Out: Verified patient identification, verified procedure, site/side was marked, verified correct patient position, special equipment/implants available, Radiology Safety Procedures followed,  medications/allergies/relevent history reviewed, required imaging and test results available.  Performed   Medications: Fentanyl: 50 mcg IV Versed: 2 mg IV Verapamil 3 mg IV Heparin 4600 units  The right radial  artery was easily canulated using a modified Seldinger technique.  Hemodynamics:   LV pressure: 122/9 Aortic pressure: 117/58  Angiography   Left Main: large, smooth and normal  Left anterior Descending:  The proximal LAD is moderately calcified.  This is associated with mild - moderate 30% stenosis.   The mid and distal LAD have minor luminal irregularities.  There is a moderate sized diag that has a 30-40% stenosis at the origin  Ramus Intermediate: moderate - large branch.  No stenosis  Left Circumflex: moderate sized vessel.  No significant stenosis  Right Coronary Artery:  Large and dominant.   95% stenosis proximal stenosis.  Teh remaining   LV Gram:  Inferior akinesis.  The remaining LV contracts fairly normally.  EF 45-50%.  Complications: No apparent complications Patient did tolerate procedure well.  Contrast used: 60 cc  Conclusions:   1. 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 Mild LV dysfunction secondary to her recent Inferior wall NSTEMI.  I have reviewed the angiograms with Dr. [place].  She will need her medical issues sorted out a bit further prior to PCI of her RCA.  She has been having some bloody diarrhea recently - she attributes it to hemorrhoids.    Will admit to medicine team.  She will need to go to the TCU.  Vesta Mixer, Montez Hageman., MD,  [facility] 01/08/2013, 5:50 PM Office - [phone] Pager [phone]

## 2013-01-08 NOTE — Progress Notes (Signed)
Patient evaluated for long-term disease management services with [facility] Care [facility]. Patient will receive a post discharge transition of care call and will be evaluated for monthly home visits for assessments and for education. She reports she will go home with husband and [facility] upon discharge. Will make inpatient case manager aware of conversation. Left [facility] Care Management packet and contact information left with patient. Appreciative of visit.   Raiford Noble, MSN-Ed, RN,BSN, [facility], [phone]

## 2013-01-08 NOTE — Progress Notes (Signed)
PT Cancellation Note  Patient Details Name: [name] MRN: [id number] DOB: Jul 25, 1946   Cancelled Treatment:    Reason Eval/Treat Not Completed: Other (comment).  Pt states that she can't get OOB today b/c she is too dehydrated from being NPO.  Pt to go have cardiac cath later today.  Will check back with pt tomorrow.   Thanks,    Vista Deck 01/08/2013, 12:55 PM

## 2013-01-08 NOTE — Progress Notes (Signed)
01/08/2013 14:00:00. Received to cath lab holding area via carelink. Painfree. BP 146/89  SR rate of 70. RR 20 with O2 sat of 95% on room air. 0.9% NS @ 93cc/hr infusing in left hand CBG 95.Marland Kitchen Marland Kitchen

## 2013-01-08 NOTE — Progress Notes (Signed)
Has had loose stools x 4. BP 154/80 SR 74 O2sat 92% on RA. Sleeping with c/o.

## 2013-01-09 LAB — CBC
MCH: 29.6 pg (ref 26.0–34.0)
MCHC: 33.2 g/dL (ref 30.0–36.0)
MCV: 89 fL (ref 78.0–100.0)
Platelets: 186 10*3/uL (ref 150–400)
RBC: 3.65 MIL/uL — ABNORMAL LOW (ref 3.87–5.11)
RDW: 13.5 % (ref 11.5–15.5)

## 2013-01-09 LAB — BASIC METABOLIC PANEL
CO2: 23 mEq/L (ref 19–32)
Calcium: 8.5 mg/dL (ref 8.4–10.5)
Creatinine, Ser: 0.69 mg/dL (ref 0.50–1.10)
GFR calc non Af Amer: 87 mL/min — ABNORMAL LOW (ref >90)
Sodium: 141 mEq/L (ref 135–145)

## 2013-01-09 LAB — GLUCOSE, CAPILLARY: Glucose-Capillary: 140 mg/dL — ABNORMAL HIGH (ref 70–99)

## 2013-01-09 MED ORDER — ALPRAZOLAM 0.5 MG PO TABS
0.5000 mg | ORAL_TABLET | Freq: Three times a day (TID) | ORAL | Status: DC | PRN
  Administered 2013-01-10 – 2013-01-12 (×2): 0.5 mg via ORAL
  Filled 2013-01-09 (×2): qty 1

## 2013-01-09 MED ORDER — METOPROLOL TARTRATE 50 MG PO TABS
50.0000 mg | ORAL_TABLET | Freq: Two times a day (BID) | ORAL | Status: DC
Start: 2013-01-09 — End: 2013-01-17
  Administered 2013-01-09 – 2013-01-17 (×16): 50 mg via ORAL
  Filled 2013-01-09 (×18): qty 1

## 2013-01-09 MED ORDER — OXYCODONE HCL 5 MG PO TABS
5.0000 mg | ORAL_TABLET | ORAL | Status: DC | PRN
  Administered 2013-01-16: 10 mg via ORAL
  Filled 2013-01-09: qty 2

## 2013-01-09 NOTE — Progress Notes (Signed)
[facility] Progress Note [place] TEAM 1 - Stepdown/ICU TEAM   NJERI VICENTE ZOX:[id number] DOB: 04/09/1946 DOA: 01/04/2013 PCP: Johny Blamer, MD  Brief narrative: 67 y.o. female with history of diabetes mellitus and hypertension, status post recent kyphoplasty (12/27/2012). She presented to [facility] ED 01/04/2013 with main concern of difficulty with ambulation at home having sustained fall after trying to get up from the bed the afternoon just prior to admission. She called 911 and upon their arrival, pt reported they checked her temperature which was 103.1 F. In ED, UA consistent with UTI and TRH asked to admit for further evaluation. Upon arrival to the floor, pt developed tachycardia, narrow complex SVT with rate of 200 bpm, elevated troponin 1.67, and mild ST depression in II and aVF. Cardiology consulted.   Pt was transferred to [facility] on 2/11 to undergo a cardiac cath.  Said cath revealed a signif RCA stenosis.  The plan at present is to work to achieve greater medical stability, and then to intervene on her RCA stenosis/    Assessment/Plan:  E coli UTI (lower urinary tract infection)  - Urine culture positive for > 100K E.Coli pan sensitive  - continue Rocephin for a plan course of 7-10 days  - pt remains afebrile  SVT (supraventricular tachycardia)  - rate controlled  - suspect this was due to early sepsis + severe DH   NSTEMI - single vessel RCA stenosis - 2 D ECHO realtively unremarkable but with grade I diastolic dysfunction  - for intervention - timing as per Cardiolgoy  Rectal bleed  - possibly related to hemorrhoidal bleed in setting of anticoag - Hgb is stable at this time   Hyponatremia  - resolved - likely due to volume depletion + ongoing lasix use   Hypotension  - improved w/ volume and holding of diuretics - carefully resume BP meds as indicated   Type II or unspecified type diabetes mellitus  - A1C stable, < 6  - continue Lantus and SSI  - CBG well  controlled   Hypokalemia  - secondary to Lasix  - supplemented and within normal limits    Acute renal failure  - most likely secondary to pre-renal azotemia (DH, infection, lasix)  - creatinine is within normal limits at this time   Anemia of chronic disease  - Hg and Hct stable and at pt's baseline   Diarrhea  - resolved - had large formed BM today  - C diff negative - likely due to UTI/Pyelo  Code Status: FULL Family Communication: spoke directly w/ pt Disposition Plan: SDU - PTCA/stent at discretion of Cardiology   Consultants: Cardiology - [place]  Procedures: 01/08/2013 - Cardiac cath - 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  She in fact had a large formed stool today.  She denies chest pain abdominal pain nausea vomiting or shortness of breath.  Objective: Blood pressure 144/67, pulse 95, temperature 98.1 F (36.7 C), temperature source Oral, resp. rate 24, height 5\' 8"  (1.727 m), weight 92.4 kg (203 lb 11.3 oz), SpO2 96.00%.  Intake/Output Summary (Last 24 hours) at 01/09/13 1534 Last data filed at 01/09/13 1300  Gross per 24 hour  Intake 2510.42 ml  Output      0 ml  Net 2510.42  ml    Exam: General: No acute respiratory distress Lungs: Clear to auscultation bilaterally without wheezes or crackles Cardiovascular: Regular rate and rhythm without murmur gallop or rub normal  Abdomen: Nontender, nondistended, soft, bowel sounds positive, no rebound, no ascites, no appreciable mass Extremities: No significant cyanosis, clubbing, or edema bilateral lower extremities  Data Reviewed: Basic Metabolic Panel:  Recent Labs Lab 01/05/13 0205 01/06/13 0305 01/07/13 0215 01/07/13 2340 01/09/13 0435  NA 136  131* 131* 134* 141  K 3.1* 3.7 3.8 4.0 3.6  CL 100 99 99 101 108  CO2 19 20 19 22 23   GLUCOSE 166* 106* 122* 131* 86  BUN 18 30* 29* 24* 16  CREATININE 1.05 1.25* 0.89 0.74 0.69  CALCIUM 8.0* 8.0* 8.3* 8.5 8.5   Liver Function Tests:  Recent Labs Lab 01/04/13 1621  AST 11  ALT 10  ALKPHOS 60  BILITOT 0.5  PROT 6.5  ALBUMIN 2.7*   CBC:  Recent Labs Lab 01/04/13 1621 01/05/13 0205 01/06/13 0305 01/07/13 0215 01/07/13 2340 01/09/13 0435  WBC 13.8* 9.7 17.2* 14.1* 10.1 10.7*  NEUTROABS 10.8* 8.6*  --   --   --   --   HGB 11.8* 10.4* 10.4* 10.5* 10.8* 10.8*  HCT 35.8* 32.2* 31.2* 30.9* 31.8* 32.5*  MCV 90.2 91.2 90.4 88.5 88.1 89.0  PLT 191 165 154 173 164 186   Cardiac Enzymes:  Recent Labs Lab 01/06/13 0305 01/06/13 0918 01/06/13 1200 01/06/13 1658 01/06/13 2330  CKTOTAL  --   --  71 51 40  CKMB  --   --  13.2* 11.3* 9.0*  TROPONINI 3.82* 2.58* 2.12* 2.81* 2.78*   CBG:  Recent Labs Lab 01/08/13 1158 01/08/13 1415 01/08/13 1812 01/09/13 0740 01/09/13 1215  GLUCAP 90 95 82 79 140*    Recent Results (from the past 240 hour(s))  CULTURE, BLOOD (ROUTINE X 2)     Status: None   Collection Time    01/04/13  4:21 PM      Result Value Range Status   Specimen Description BLOOD RIGHT ARM   Final   Special Requests BOTTLES DRAWN AEROBIC AND ANAEROBIC 5CC EACH   Final   Culture  Setup Time 01/04/2013 22:30   Final   Culture     Final   Value:        BLOOD CULTURE RECEIVED NO GROWTH TO DATE CULTURE WILL BE HELD FOR 5 DAYS BEFORE ISSUING A FINAL NEGATIVE REPORT   Report Status PENDING   Incomplete  CULTURE, BLOOD (ROUTINE X 2)     Status: None   Collection Time    01/04/13  4:33 PM      Result Value Range Status   Specimen Description BLOOD RIGHT HAND   Final   Special Requests BOTTLES DRAWN AEROBIC AND ANAEROBIC [facility] EACH   Final   Culture  Setup Time 01/04/2013 22:30   Final   Culture     Final   Value:        BLOOD CULTURE RECEIVED NO GROWTH TO DATE  CULTURE WILL BE HELD FOR 5 DAYS BEFORE ISSUING A FINAL NEGATIVE REPORT   Report Status PENDING   Incomplete  URINE CULTURE     Status: None   Collection Time    01/04/13  5:46 PM      Result Value Range Status   Specimen Description URINE, CATHETERIZED   Final   Special Requests NONE   Final   Culture  Setup Time 01/05/2013 04:26  Final   Colony Count >=100,000 COLONIES/ML   Final   Culture ESCHERICHIA COLI   Final   Report Status 01/06/2013 FINAL   Final   Organism ID, Bacteria ESCHERICHIA COLI   Final  CLOSTRIDIUM DIFFICILE BY PCR     Status: None   Collection Time    01/09/13  5:51 AM      Result Value Range Status   C difficile by pcr NEGATIVE  NEGATIVE Final     Studies:  Recent x-ray studies have been reviewed in detail by the Attending Physician  Scheduled Meds:  Reviewed in detail by the Attending Physician   Lonia Blood, MD [facility] Office  [phone] Pager [phone]  On-Call/Text Page:      [email or web address]      password [email or web address]  If 7PM-7AM, please contact night-coverage [email or web address] Password [facility] 01/09/2013, 3:34 PM   LOS: 5 days

## 2013-01-09 NOTE — Care Management Note (Addendum)
    Page 1 of 2   01/17/2013     10:29:58 AM   CARE MANAGEMENT NOTE 01/17/2013  Patient:  [name], [name]   Account Number:  [id number]  Date Initiated:  01/05/2013  Documentation initiated by:  [facility]  Subjective/Objective Assessment:   67 year old female admitted with fever on 103, found to have UTI.     Action/Plan:   Pt lived with spouse at home PTA.   Anticipated DC Date:     Anticipated DC Plan:  HOME W HOME HEALTH SERVICES      DC Planning Services  CM consult      [facility] Choice  HOME HEALTH   Choice offered to / List presented to:  C-1 Patient   DME arranged  3-N-1  [facility]an [place]      DME agency  [facility]     [facility] arranged  HH-1 RN  HH-10 DISEASE MANAGEMENT  HH-2 PT  HH-4 NURSE'S AIDE  HH-6 SOCIAL WORKER      HH agency  [facility]   Status of service:  Completed, signed off Medicare Important Message given?  NA - LOS <3 / Initial given by admissions (If response is "NO", the following Medicare IM given date fields will be blank) Date Medicare IM given:   Date Additional Medicare IM given:    Discharge Disposition:  HOME W HOME HEALTH SERVICES  Per UR Regulation:  Reviewed for med. necessity/level of care/duration of stay  If discussed at Long Length of Stay Meetings, dates discussed:   01/17/2013    Comments:  01-17-13 [address], [place] [phone] CM did speak to pt and she is agreeable to [facility] services. Pt wants to use [facility] for services. CM did make referral for services and SOC to begin within 24-48 hours post d/c. DME to be delivered to room before d/c. No further needs from CM at this time.   2/19 0942 debbie dowell rn,bsn transf to [facility] post cath, phy ther rec hhc, ahc on standby will just need final orders at disch.  2/12 0936 debbie dowell rn,bsn spoke w pt and husband. she has spoken w Gerre Scull [facility]). pt would like [facility] for hhc at disch. will need order but  have alerted [facility] donna of poss hhrn need.

## 2013-01-09 NOTE — Progress Notes (Signed)
Physical Therapy Treatment Patient Details Name: [name] MRN: [id number] DOB: 1946/07/20 Today's Date: 01/09/2013 Time: 1135-1202 PT Time Calculation (min): 27 min  PT Assessment / Plan / Recommendation Comments on Treatment Session  Pt presents with UTI and NSTEMI with recent history of kyphoplasty on 1/30 at [facility] and a fall at home (hit head on plastic trashcan). Heart cath 1/11 found to have tight RCA stenosis. Limited activity today due to fatigue and HR (fluctuated between 77-103) but moving well.    Follow Up Recommendations  Home health PT;Supervision for mobility/OOB     Does the patient have the potential to tolerate intense rehabilitation     Barriers to Discharge        Equipment Recommendations  None recommended by PT    Recommendations for Other Services    Frequency Min 3X/week   Plan Discharge plan remains appropriate;Frequency remains appropriate    Precautions / Restrictions Precautions Precautions: Fall Precaution Comments: recent kyphoplasty in Jan. Restrictions Weight Bearing Restrictions: No   Pertinent Vitals/Pain HR 77 (at rest) and up to 103 with transfer to 3in1; Sao2 95% on RA    Mobility  Bed Mobility Bed Mobility: Supine to Sit Supine to Sit: 5: Supervision;With rails Transfers Transfers: Sit to Stand;Stand to Sit Sit to Stand: 4: Min assist;With upper extremity assist;From bed;From chair/3-in-1 Stand to Sit: 4: Min assist;With upper extremity assist;To bed;To chair/3-in-1 Ambulation/Gait Ambulation/Gait Assistance: 4: Min assist Ambulation Distance (Feet): 10 Feet Assistive device: Rolling walker Ambulation/Gait Assistance Details: cues for tall posture, limited ambulation because of elevated HR and fatigue as well as incontinence; cues for safety with RW and stability assist Gait Pattern: Trunk flexed;Shuffle Stairs: No      PT Goals Acute Rehab PT Goals PT Goal: Supine/Side to Sit - Progress: Progressing toward  goal PT Goal: Sit to Stand - Progress: Progressing toward goal PT Goal: Stand to Sit - Progress: Progressing toward goal PT Goal: Ambulate - Progress: Progressing toward goal  Visit Information  Last PT Received On: 01/09/13 Assistance Needed: +1 (+2 helpful for lines and pt incontinent)    Subjective Data  Subjective: I need to pee a little bit first.  Patient Stated Goal: home   Cognition  Cognition Overall Cognitive Status: Appears within functional limits for tasks assessed/performed Arousal/Alertness: Awake/alert Orientation Level: Appears intact for tasks assessed Behavior During Session: [facility] for tasks performed Following Commands: Follows one step commands consistently;Follows multi-step commands consistently    Balance     End of Session PT - End of Session Equipment Utilized During Treatment: Gait belt Activity Tolerance: Patient tolerated treatment well Patient left: in chair Nurse Communication: Mobility status   GP     [facility] HELEN 01/09/2013, 1:03 PM

## 2013-01-09 NOTE — Progress Notes (Signed)
PROGRESS NOTE  Subjective:   Pt had a cath yesterday evening - found to have a tight RCA stenosis.  Objective:    Vital Signs:   Temp:  [97.7 F (36.5 C)-98.2 F (36.8 C)] 98 F (36.7 C) (02/12 0741) Pulse Rate:  [69-88] 71 (02/12 0741) Resp:  [18-25] 22 (02/12 0400) BP: (129-172)/(63-83) 172/83 mmHg (02/12 0741) SpO2:  [95 %-100 %] 100 % (02/12 0741) Weight:  [203 lb 11.3 oz (92.4 kg)-204 lb 9.4 oz (92.8 kg)] 203 lb 11.3 oz (92.4 kg) (02/12 0500)  Last BM Date: 01/07/13   24-hour weight change: Weight change: 15.8 oz (0.448 kg)  Weight trends: Filed Weights   01/08/13 0500 01/08/13 1850 01/09/13 0500  Weight: 203 lb 9.6 oz (92.352 kg) 204 lb 9.4 oz (92.8 kg) 203 lb 11.3 oz (92.4 kg)    Intake/Output:  02/11 0701 - 02/12 0700 In: 1522.9 [I.V.:1472.9; IV Piggyback:50] Out: -       Physical Exam: BP 172/83  Pulse 71  Temp(Src) 98 F (36.7 C) (Oral)  Resp 22  Ht 5\' 8"  (1.727 m)  Wt 203 lb 11.3 oz (92.4 kg)  BMI 30.98 kg/m2  SpO2 100%  General: Vital signs reviewed and noted.   Head: Normocephalic, atraumatic.  Eyes: conjunctivae/corneas clear.  EOM's intact.   Throat: normal  Neck:  normal  Lungs:  clear  Heart:  RR  Abdomen:  Distended, tender, few bowel sounds  Extremities: Right radial cath site looks ok.  Good pulses   Neurologic: A&O X3, CN II - XII are grossly intact.   Psych: anxious    Labs: BMET:  Recent Labs  01/07/13 2340 01/09/13 0435  NA 134* 141  K 4.0 3.6  CL 101 108  CO2 22 23  GLUCOSE 131* 86  BUN 24* 16  CREATININE 0.74 0.69  CALCIUM 8.5 8.5    Liver function tests: No results found for this basename: AST, ALT, ALKPHOS, BILITOT, PROT, ALBUMIN,  in the last 72 hours No results found for this basename: LIPASE, AMYLASE,  in the last 72 hours  CBC:  Recent Labs  01/07/13 2340 01/09/13 0435  WBC 10.1 10.7*  HGB 10.8* 10.8*  HCT 31.8* 32.5*  MCV 88.1 89.0  PLT 164 186    Cardiac Enzymes:  Recent Labs  01/06/13 0918 01/06/13 1200 01/06/13 1658 01/06/13 2330  CKTOTAL  --  71 51 40  CKMB  --  13.2* 11.3* 9.0*  TROPONINI 2.58* 2.12* 2.81* 2.78*    Coagulation Studies:  Recent Labs  01/07/13 2340  LABPROT 14.5  INR 1.15    Other: No components found with this basename: POCBNP,  No results found for this basename: DDIMER,  in the last 72 hours No results found for this basename: HGBA1C,  in the last 72 hours No results found for this basename: CHOL, HDL, LDLCALC, TRIG, CHOLHDL,  in the last 72 hours No results found for this basename: TSH, T4TOTAL, FREET3, T3FREE, THYROIDAB,  in the last 72 hours No results found for this basename: VITAMINB12, FOLATE, FERRITIN, TIBC, IRON, RETICCTPCT,  in the last 72 hours   Other results:  Tele:  NSR at 74  Medications:    Infusions: . sodium chloride 125 mL/hr at 01/09/13 3086    Scheduled Medications: . aspirin  325 mg Oral Daily  . cefTRIAXone (ROCEPHIN)  IV  1 g Intravenous Q0600  . feeding supplement  237 mL Oral BID BM  . insulin aspart  0-9 Units Subcutaneous TID WC  .  insulin glargine  10 Units Subcutaneous BID  . metoprolol tartrate  25 mg Oral BID  . multivitamin with minerals  1 tablet Oral q morning - 10a  . simvastatin  40 mg Oral QHS  . sodium chloride  3 mL Intravenous Q12H    Assessment/ Plan:    1. CAD:  Patient has a tight stenosis in the proximal RCA.  She will need this stented but we would like to allow her time to get better.  She is still incontinent of urine.  Her abdomen is still distended and she is still having diarrhea.   2. Abdominal distension: plans per medical team.  I talked to Dr. Sharl Ma last night.   Disposition:   Length of Stay: 5  Vesta Mixer, Montez Hageman., MD, [facility] 01/09/2013, 8:41 AM Office [phone] Pager [phone]

## 2013-01-10 DIAGNOSIS — I214 Non-ST elevation (NSTEMI) myocardial infarction: Secondary | ICD-10-CM

## 2013-01-10 DIAGNOSIS — N390 Urinary tract infection, site not specified: Secondary | ICD-10-CM

## 2013-01-10 LAB — CBC
HCT: 32.1 % — ABNORMAL LOW (ref 36.0–46.0)
MCV: 87.5 fL (ref 78.0–100.0)
RDW: 13.1 % (ref 11.5–15.5)
WBC: 10.1 10*3/uL (ref 4.0–10.5)

## 2013-01-10 LAB — BASIC METABOLIC PANEL
BUN: 16 mg/dL (ref 6–23)
CO2: 26 mEq/L (ref 19–32)
Chloride: 104 mEq/L (ref 96–112)
Creatinine, Ser: 0.65 mg/dL (ref 0.50–1.10)

## 2013-01-10 NOTE — Progress Notes (Signed)
[facility] Progress Note [place] TEAM 1 - Stepdown/ICU TEAM   [name] ZOX:[id number] DOB: 09-21-1946 DOA: 01/04/2013 PCP: Johny Blamer, MD  Brief narrative: 67 y.o. female with history of diabetes mellitus and hypertension, status post recent kyphoplasty (12/27/2012). She presented to [facility] ED 01/04/2013 with main concern of difficulty with ambulation at home having sustained fall after trying to get up from the bed the afternoon just prior to admission. She called 911 and upon their arrival, pt reported they checked her temperature which was 103.1 F. In ED, UA consistent with UTI and TRH asked to admit for further evaluation. Upon arrival to the floor, pt developed tachycardia, narrow complex SVT with rate of 200 bpm, elevated troponin 1.67, and mild ST depression in II and aVF. Cardiology consulted.   Pt was transferred to [facility] on 2/11 to undergo a cardiac cath.  Said cath revealed a signif RCA stenosis.  The plan at present is to work to achieve greater medical stability, and then to intervene on her RCA stenosis/    Assessment/Plan:  E coli UTI (lower urinary tract infection)  - Urine culture positive for > 100K E.Coli pan sensitive  -Patient gives no history of bladder prolapse, previous bladder surgeries or frequent urinary tract infections - However per RN the patient chooses to be incontinent of urine and stool at night - continue Rocephin for a plan course of 7 days  - pt remains afebrile  SVT (supraventricular tachycardia)  - rate controlled  - suspect this was due to early sepsis + severe DH   NSTEMI - single vessel RCA stenosis - 2 D ECHO realtively unremarkable but with grade I diastolic dysfunction  - for intervention - timing as per Cardiolgoy  Rectal bleed  - possibly related to hemorrhoidal bleed in setting of anticoag - Hgb is stable at this time  -Will go ahead and request GI workup as suggested by cardiology-I have spoken with Dr. Madilyn Fireman and he will  probably not be able to see her tomorrow which means a colonoscopy would not be done until the weekend  Hyponatremia  - resolved - likely due to volume depletion + ongoing lasix use   Hypotension  - improved w/ volume and holding of diuretics - carefully resume BP meds as indicated   Type II or unspecified type diabetes mellitus  - A1C stable, < 6  - continue Lantus and SSI  - CBG well controlled   Hypokalemia  - secondary to Lasix  - supplemented and within normal limits    Acute renal failure  - most likely secondary to pre-renal azotemia (DH, infection, lasix)  - creatinine is within normal limits at this time   Anemia of chronic disease  - Hg and Hct stable and at pt's baseline   Diarrhea  - resolved - had large formed BM today  - C diff negative - likely due to UTI/Pyelo  Code Status: FULL Family Communication: spoke directly w/ pt Disposition Plan: Transfer to telemetry  Consultants: Cardiology - [place]  Procedures: 01/08/2013 - Cardiac cath - 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 Further history obtained regarding UTI - mentioned above. Per RN the patient is still having stools which are streaked with blood.  Objective: Blood pressure 157/72, pulse 72, temperature 97.8 F (  36.6 C), temperature source Oral, resp. rate 26, height 5\' 8"  (1.727 m), weight 93 kg (205 lb 0.4 oz), SpO2 99.00%.  Intake/Output Summary (Last 24 hours) at 01/10/13 1643 Last data filed at 01/10/13 1400  Gross per 24 hour  Intake   1010 ml  Output   1000 ml  Net     10 ml    Exam: General: No acute respiratory distress Lungs: Clear to auscultation bilaterally without wheezes or crackles Cardiovascular: Regular rate and rhythm without murmur  gallop or rub normal  Abdomen: Nontender, nondistended, soft, bowel sounds positive, no rebound, no ascites, no appreciable mass Extremities: No significant cyanosis, clubbing, or edema bilateral lower extremities  Data Reviewed: Basic Metabolic Panel:  Recent Labs Lab 01/06/13 0305 01/07/13 0215 01/07/13 2340 01/09/13 0435 01/10/13 0445  NA 131* 131* 134* 141 139  K 3.7 3.8 4.0 3.6 3.6  CL 99 99 101 108 104  CO2 20 19 22 23 26   GLUCOSE 106* 122* 131* 86 137*  BUN 30* 29* 24* 16 16  CREATININE 1.25* 0.89 0.74 0.69 0.65  CALCIUM 8.0* 8.3* 8.5 8.5 8.6   Liver Function Tests:  Recent Labs Lab 01/04/13 1621  AST 11  ALT 10  ALKPHOS 60  BILITOT 0.5  PROT 6.5  ALBUMIN 2.7*   CBC:  Recent Labs Lab 01/04/13 1621 01/05/13 0205 01/06/13 0305 01/07/13 0215 01/07/13 2340 01/09/13 0435 01/10/13 0445  WBC 13.8* 9.7 17.2* 14.1* 10.1 10.7* 10.1  NEUTROABS 10.8* 8.6*  --   --   --   --   --   HGB 11.8* 10.4* 10.4* 10.5* 10.8* 10.8* 11.0*  HCT 35.8* 32.2* 31.2* 30.9* 31.8* 32.5* 32.1*  MCV 90.2 91.2 90.4 88.5 88.1 89.0 87.5  PLT 191 165 154 173 164 186 200   Cardiac Enzymes:  Recent Labs Lab 01/06/13 0305 01/06/13 0918 01/06/13 1200 01/06/13 1658 01/06/13 2330  CKTOTAL  --   --  71 51 40  CKMB  --   --  13.2* 11.3* 9.0*  TROPONINI 3.82* 2.58* 2.12* 2.81* 2.78*   CBG:  Recent Labs Lab 01/08/13 1415 01/08/13 1812 01/09/13 0740 01/09/13 1215 01/09/13 1721  GLUCAP 95 82 79 140* 109*    Recent Results (from the past 240 hour(s))  CULTURE, BLOOD (ROUTINE X 2)     Status: None   Collection Time    01/04/13  4:21 PM      Result Value Range Status   Specimen Description BLOOD RIGHT ARM   Final   Special Requests BOTTLES DRAWN AEROBIC AND ANAEROBIC 5CC EACH   Final   Culture  Setup Time 01/04/2013 22:30   Final   Culture     Final   Value:        BLOOD CULTURE RECEIVED NO GROWTH TO DATE CULTURE WILL BE HELD FOR 5 DAYS BEFORE ISSUING A FINAL NEGATIVE REPORT    Report Status PENDING   Incomplete  CULTURE, BLOOD (ROUTINE X 2)     Status: None   Collection Time    01/04/13  4:33 PM      Result Value Range Status   Specimen Description BLOOD RIGHT HAND   Final   Special Requests BOTTLES DRAWN AEROBIC AND ANAEROBIC [facility] EACH   Final   Culture  Setup Time 01/04/2013 22:30   Final   Culture     Final   Value:        BLOOD CULTURE RECEIVED NO GROWTH TO DATE CULTURE WILL BE HELD FOR 5  DAYS BEFORE ISSUING A FINAL NEGATIVE REPORT   Report Status PENDING   Incomplete  URINE CULTURE     Status: None   Collection Time    01/04/13  5:46 PM      Result Value Range Status   Specimen Description URINE, CATHETERIZED   Final   Special Requests NONE   Final   Culture  Setup Time 01/05/2013 04:26   Final   Colony Count >=100,000 COLONIES/ML   Final   Culture ESCHERICHIA COLI   Final   Report Status 01/06/2013 FINAL   Final   Organism ID, Bacteria ESCHERICHIA COLI   Final  CLOSTRIDIUM DIFFICILE BY PCR     Status: None   Collection Time    01/09/13  5:51 AM      Result Value Range Status   C difficile by pcr NEGATIVE  NEGATIVE Final     Studies:  Recent x-ray studies have been reviewed in detail by the Attending Physician  Scheduled Meds:  Reviewed in detail by the Attending Physician   Calvert Cantor, MD [phone]  If 7PM-7AM, please contact night-coverage [email or web address] Password [facility] 01/10/2013, 4:43 PM   LOS: 6 days

## 2013-01-10 NOTE — Progress Notes (Signed)
    SUBJECTIVE: No chest pain or SOB.   BP 175/84  Pulse 73  Temp(Src) 98.4 F (36.9 C) (Oral)  Resp 23  Ht 5\' 8"  (1.727 m)  Wt 205 lb 0.4 oz (93 kg)  BMI 31.18 kg/m2  SpO2 93%  Intake/Output Summary (Last 24 hours) at 01/10/13 0804 Last data filed at 01/09/13 2200  Gross per 24 hour  Intake    920 ml  Output    125 ml  Net    795 ml    PHYSICAL EXAM General: Well developed, well nourished, in no acute distress. Alert and oriented x 3.  Psych:  Good affect, responds appropriately Neck: No JVD. No masses noted.  Lungs: Clear bilaterally with no wheezes or rhonci noted.  Heart: RRR with no murmurs noted. Abdomen: Bowel sounds are present. Soft, non-tender.  Extremities: No lower extremity edema.   LABS: Basic Metabolic Panel:  Recent Labs  16/10/96 0435 01/10/13 0445  NA 141 139  K 3.6 3.6  CL 108 104  CO2 23 26  GLUCOSE 86 137*  BUN 16 16  CREATININE 0.69 0.65  CALCIUM 8.5 8.6   CBC:  Recent Labs  01/09/13 0435 01/10/13 0445  WBC 10.7* 10.1  HGB 10.8* 11.0*  HCT 32.5* 32.1*  MCV 89.0 87.5  PLT 186 200   Current Meds: . aspirin  325 mg Oral Daily  . cefTRIAXone (ROCEPHIN)  IV  1 g Intravenous Q0600  . feeding supplement  237 mL Oral BID BM  . insulin aspart  0-9 Units Subcutaneous TID WC  . insulin glargine  10 Units Subcutaneous BID  . metoprolol tartrate  50 mg Oral BID  . multivitamin with minerals  1 tablet Oral q morning - 10a  . simvastatin  40 mg Oral QHS  . sodium chloride  3 mL Intravenous Q12H     Principal Problem:   UTI (lower urinary tract infection) Active Problems:   Hyponatremia   Type II or unspecified type diabetes mellitus without mention of complication, not stated as uncontrolled   Hypokalemia   Fever   H/O vertebroplasty   SVT (supraventricular tachycardia)   Non-ST elevation myocardial infarction (NSTEMI)   Hematochezia   ASSESSMENT AND PLAN:  1. CAD/NSTEMI: Pt admitted with UTI, GI bleeding, diarrhea and  abdominal pain and found to have NSTEMI. Cardiac cath 01/08/13 with severe RCA stenosis. She has been followed by Dr. Elease Hashimoto and plans are to delay PCI of the RCA until her other medical conditions have stabilized. She continues to have blood in her stool. Her diarrhea is resolving. Stent placement is high risk in this patient with ongoing GI blood loss, even though it is currently mild. She had more significant blood loss when on IV heparin.   2. GI bleeding: Nursing and patient reports bright red bloods with each stool. Diarrhea resolving. This may be related to hemorrhoids but it is unsafe to proceed with PCI and stent placement until there is further workup of her GI bleeding. Recommend GI consult. If no invasive workup such as colonoscopy is recommended, then she would need a challenge with ASA and Plavix for 24-48 hours to make sure she does not have more bleeding. H/H stable.   3. Dispo: OK to transfer to telemetry from cardiac standpoint. We will follow and make further recs.    [name]  2/13/20148:04 AM

## 2013-01-11 DIAGNOSIS — I2510 Atherosclerotic heart disease of native coronary artery without angina pectoris: Secondary | ICD-10-CM | POA: Diagnosis present

## 2013-01-11 DIAGNOSIS — B370 Candidal stomatitis: Secondary | ICD-10-CM | POA: Diagnosis not present

## 2013-01-11 DIAGNOSIS — Z9861 Coronary angioplasty status: Secondary | ICD-10-CM | POA: Diagnosis present

## 2013-01-11 LAB — CULTURE, BLOOD (ROUTINE X 2): Culture: NO GROWTH

## 2013-01-11 LAB — GLUCOSE, CAPILLARY
Glucose-Capillary: 119 mg/dL — ABNORMAL HIGH (ref 70–99)
Glucose-Capillary: 84 mg/dL (ref 70–99)

## 2013-01-11 MED ORDER — FLUCONAZOLE 100 MG PO TABS
100.0000 mg | ORAL_TABLET | Freq: Every day | ORAL | Status: DC
  Administered 2013-01-11 – 2013-01-17 (×6): 100 mg via ORAL
  Filled 2013-01-11 (×7): qty 1

## 2013-01-11 MED ORDER — PEG 3350-KCL-NA BICARB-NACL 420 G PO SOLR
4000.0000 mL | Freq: Once | ORAL | Status: AC
  Administered 2013-01-11: 4000 mL via ORAL
  Filled 2013-01-11: qty 4000

## 2013-01-11 MED ORDER — SODIUM CHLORIDE 0.9 % IV SOLN
INTRAVENOUS | Status: DC
  Administered 2013-01-12: 500 mL via INTRAVENOUS

## 2013-01-11 MED ORDER — AMLODIPINE BESYLATE 5 MG PO TABS
5.0000 mg | ORAL_TABLET | Freq: Every day | ORAL | Status: DC
  Administered 2013-01-11 – 2013-01-17 (×7): 5 mg via ORAL
  Filled 2013-01-11 (×7): qty 1

## 2013-01-11 NOTE — Progress Notes (Signed)
Physical Therapy Treatment Patient Details Name: [name] MRN: [id number] DOB: Jun 08, 1946 Today's Date: 01/11/2013 Time: 1115-1140 PT Time Calculation (min): 25 min  PT Assessment / Plan / Recommendation Comments on Treatment Session  Pt admit with UTI and NSTEMI wtih recent kyphoplasty on 1/30 at [facility] with a fall.  Pt progressing with ambulation today.  Poor safety awareness.      Follow Up Recommendations  Home health PT;Supervision/Assistance - 24 hour                 Equipment Recommendations  Rolling walker with 5" wheels        Frequency Min 3X/week   Plan Discharge plan remains appropriate;Frequency remains appropriate    Precautions / Restrictions Precautions Precautions: Fall Restrictions Weight Bearing Restrictions: No   Pertinent Vitals/Pain VSS, No pain    Mobility  Bed Mobility Bed Mobility: Not assessed Transfers Transfers: Sit to Stand;Stand to Sit;Stand Pivot Transfers Sit to Stand: 4: Min assist;With upper extremity assist;With armrests;From chair/3-in-1 Stand to Sit: 4: Min assist;With upper extremity assist;With armrests;To chair/3-in-1 Stand Pivot Transfers: 4: Min assist Details for Transfer Assistance: Pt needed 2 attempts to rise, as well as to steady once fully upright.  Needed assist to control descent into chair as well.  Cues needed for hand placement and safety with sit to stand and transfers as well.  Stood and pivoted to 3N1 first with total assist to clean bottom after and then pt ambulated in hallway.   Ambulation/Gait Ambulation/Gait Assistance: 4: Min assist;Other (comment) (2nd person followed pt with chair as pt odd/unpredictable) Ambulation Distance (Feet): 125 Feet Assistive device: Rolling walker Ambulation/Gait Assistance Details: Pt needed constant cues for tall posture as well as cues to incr step length bilaterally.  Pt needs constant cues for safety with Rw and stability as pt demonstrates instabiltiy at times and lacks  awareness of instability needing min assist.   Gait Pattern: Trunk flexed;Shuffle;Step-to pattern;Decreased stride length Gait velocity: variable.  Sometimes would speed up but need cues as she would push RW faster than she would step thus decreasing her safety Stairs: No Wheelchair Mobility Wheelchair Mobility: No    Exercises General Exercises - Lower Extremity Long Arc Quad: AROM;Both;10 reps;Seated    PT Goals Acute Rehab PT Goals PT Goal: Sit to Stand - Progress: Progressing toward goal PT Goal: Stand to Sit - Progress: Progressing toward goal PT Goal: Ambulate - Progress: Progressing toward goal  Visit Information  Last PT Received On: 01/11/13 Assistance Needed: +1    Subjective Data  Subjective: "I have to get on the potty first."   Cognition  Cognition Overall Cognitive Status: Appears within functional limits for tasks assessed/performed Area of Impairment: Following commands;Safety/judgement;Awareness of errors Arousal/Alertness: Awake/alert Orientation Level: Appears intact for tasks assessed Behavior During Session: [facility] for tasks performed Following Commands: Follows one step commands consistently;Follows multi-step commands consistently Safety/Judgement: Decreased awareness of safety precautions;Decreased safety judgement for tasks assessed Awareness of Errors: Assistance required to identify errors made;Assistance required to correct errors made    Balance  Static Standing Balance Static Standing - Balance Support: Bilateral upper extremity supported;During functional activity Static Standing - Level of Assistance: 4: Min assist Static Standing - Comment/# of Minutes: Needed min assist to steady with RW secondary to pts lack of safety awareness with mobility.    End of Session PT - End of Session Equipment Utilized During Treatment: Gait belt Activity Tolerance: Patient tolerated treatment well Patient left: in chair;with call bell/phone within reach;with  family/visitor  present Nurse Communication: Mobility status        INGOLD,Zamani Crocker 01/11/2013, 2:12 PM [facility] Acute Rehabilitation [phone] (pager)

## 2013-01-11 NOTE — Progress Notes (Signed)
[facility] PROGRESS NOTE  MACHELLE RAYBON ZOX:[id number] DOB: Apr 11, 1946 DOA: 01/04/2013 PCP: Johny Blamer, MD  Assessment/Plan: Principal Problem:   UTI (lower urinary tract infection) Active Problems:   Hyponatremia   Type II or unspecified type diabetes mellitus without mention of complication, not stated as uncontrolled   Hypokalemia   Fever   H/O vertebroplasty   SVT (supraventricular tachycardia)   Non-ST elevation myocardial infarction (NSTEMI)   Hematochezia    1. E coli UTI (lower urinary tract infection): Patient presented with a pyrexia of 103.1 and wcc of 13.8. Urinalysis revealed a positive urinary sediment, consistent with UTI. She was commenced on iv Rocephin, now day # 8. Urine culture positive for > 100K E.Coli pan-sensitive. Patient has no history of bladder prolapse, previous bladder surgeries or frequent urinary tract infections. Patient defervesced and as of 01/07/13, wcc has normalized, and patient feels better. Antibiotic discontinued today.  2. SVT (supraventricular tachycardia): Soon after admission, patient developed tachycardia, narrow complex SVT with rate of 200 bpm, elevated troponin 1.67, and mild ST depression in II and aVF. She had a subsequent spontaneous resolution of this with resolution of diffuse ST changes and no chest pain. Dr Rollene Rotunda provided cardiology consultation. The likely etiology is early sepsis and NSTEMI. Beta-blocker was commenced.  3. NSTEMI/single vessel RCA stenosis:  As described above, patient's cardiac enzymes were found to be elevated at 1.67, following her episode of PSVT. 2 D ECHO realtively unremarkable but with grade I diastolic dysfunction. Cardiac cath 01/08/13 revealed severe RCA stenosis. She has been followed by Dr. Elease Hashimoto and plans are to delay PCI of the RCA until her other medical conditions have stabilized.  4. Rectal bleed:  Nursing and patient reports bright red blood with each stool. This is possibly  related to hemorrhoidal bleed in setting of anticoagulation. Hb is stable at this time, but as stent placement is deemed high risk in this patient with ongoing GI blood loss, GI evaluation is warranted, and Dr Dorena Cookey, has been consulted. Awaiting recommendations.  5. Hypotension/Hyponatremia/Hypokalemia: This was transient, and attributable to volume depletion and dehydration. Diuretics were held, iv fluids utilized, with resolution. Potassium repleted as indicated.  Anti-hypertensives cautiously restarted. Added Amlodipine today.  6. AKI: Patient has a bump in creatinine to 1.25 on 01/06/13 (Creatinine on admission was 0.76), most likely secondary to pre-renal azotemia. Now resolved.  7. Type II or unspecified type diabetes mellitus: Controlled on SSI/Lantus. HBA1C stable is 6.1.  8. Anemia of chronic disease: This is mild, normocytic. HB is stable and at patient's baseline. Following CBC.   9. Diarrhea: Transient, self-limited. Fortunately, stool C. Difficile PCR was negative.  9. Oral thrush: This was an incidental finding on physical examination on 01/11/13. For a 7-day course of Diflucan.    Code Status: Full Code.  Family Communication:  Disposition Plan: To be determined.    Brief narrative: 67 y.o. female with history of diabetes mellitus and hypertension, status post recent kyphoplasty (12/27/2012), presenting to [facility] ED 01/04/2013, with main concern of difficulty with ambulation at home, having sustained fall after trying to get up from the bed the afternoon just prior to admission. She called 911 and upon their arrival, they checked her temperature which was 103.1 F. In ED, UA consistent with UTI and TRH asked to admit for further evaluation. Upon arrival to the floor, pt developed tachycardia, narrow complex SVT with rate of 200 bpm, elevated troponin 1.67, and mild ST depression in II and aVF. Cardiology consulted.  Consultants:  Dr Rollene Rotunda, cardiologist.    Procedures:  CXR.  X-Ray right shoulder.   Antibiotics:  Rocephin 01/04/13-01/11/13.   HPI/Subjective: No new issues.   Objective: Vital signs in last 24 hours: Temp:  [97.7 F (36.5 C)-98.7 F (37.1 C)] 98.6 F (37 C) (02/14 0530) Pulse Rate:  [65-102] 79 (02/14 0530) Resp:  [13-28] 19 (02/14 0530) BP: (142-157)/(62-78) 153/69 mmHg (02/14 0530) SpO2:  [92 %-99 %] 92 % (02/14 0530) Weight:  [92 kg (202 lb 13.2 oz)] 92 kg (202 lb 13.2 oz) (02/14 0530) Weight change: -1 kg (-2 lb 3.3 oz) Last BM Date: 01/10/13  Intake/Output from previous day: 02/13 0701 - 02/14 0700 In: 960 [P.O.:960] Out: 1125 [Urine:1125]     Physical Exam: General: Comfortable, alert, communicative, fully oriented, not short of breath at rest.  HEENT:  Mild clinical pallor, no jaundice, no conjunctival injection or discharge. Hydration is fair. Has mild oral thrush.  NECK:  Supple, JVP not seen, no carotid bruits, no palpable lymphadenopathy, no palpable goiter. CHEST:  Clinically clear to auscultation, no wheezes, no crackles. HEART:  Sounds 1 and 2 heard, normal, regular, no murmurs. ABDOMEN:  Full, soft, non-tender, no palpable organomegaly, no palpable masses, normal bowel sounds. GENITALIA:  Normal external genitalia. LOWER EXTREMITIES:  Mild pitting edema, palpable peripheral pulses. MUSCULOSKELETAL SYSTEM:  Generalized osteoarthritic changes, otherwise, normal. CENTRAL NERVOUS SYSTEM:  No focal neurologic deficit on gross examination.  Lab Results:  Recent Labs  01/09/13 0435 01/10/13 0445  WBC 10.7* 10.1  HGB 10.8* 11.0*  HCT 32.5* 32.1*  PLT 186 200    Recent Labs  01/09/13 0435 01/10/13 0445  NA 141 139  K 3.6 3.6  CL 108 104  CO2 23 26  GLUCOSE 86 137*  BUN 16 16  CREATININE 0.69 0.65  CALCIUM 8.5 8.6   Recent Results (from the past 240 hour(s))  CULTURE, BLOOD (ROUTINE X 2)     Status: None   Collection Time    01/04/13  4:21 PM      Result Value Range Status    Specimen Description BLOOD RIGHT ARM   Final   Special Requests BOTTLES DRAWN AEROBIC AND ANAEROBIC 5CC EACH   Final   Culture  Setup Time 01/04/2013 22:30   Final   Culture     Final   Value:        BLOOD CULTURE RECEIVED NO GROWTH TO DATE CULTURE WILL BE HELD FOR 5 DAYS BEFORE ISSUING A FINAL NEGATIVE REPORT   Report Status PENDING   Incomplete  CULTURE, BLOOD (ROUTINE X 2)     Status: None   Collection Time    01/04/13  4:33 PM      Result Value Range Status   Specimen Description BLOOD RIGHT HAND   Final   Special Requests BOTTLES DRAWN AEROBIC AND ANAEROBIC [facility] EACH   Final   Culture  Setup Time 01/04/2013 22:30   Final   Culture     Final   Value:        BLOOD CULTURE RECEIVED NO GROWTH TO DATE CULTURE WILL BE HELD FOR 5 DAYS BEFORE ISSUING A FINAL NEGATIVE REPORT   Report Status PENDING   Incomplete  URINE CULTURE     Status: None   Collection Time    01/04/13  5:46 PM      Result Value Range Status   Specimen Description URINE, CATHETERIZED   Final   Special Requests NONE   Final   Culture  Setup Time 01/05/2013 04:26   Final   Colony Count >=100,000 COLONIES/ML   Final   Culture ESCHERICHIA COLI   Final   Report Status 01/06/2013 FINAL   Final   Organism ID, Bacteria ESCHERICHIA COLI   Final  CLOSTRIDIUM DIFFICILE BY PCR     Status: None   Collection Time    01/09/13  5:51 AM      Result Value Range Status   C difficile by pcr NEGATIVE  NEGATIVE Final     Studies/Results: No results found.  Medications: Scheduled Meds: . aspirin  325 mg Oral Daily  . cefTRIAXone (ROCEPHIN)  IV  1 g Intravenous Q0600  . feeding supplement  237 mL Oral BID BM  . insulin aspart  0-9 Units Subcutaneous TID WC  . insulin glargine  10 Units Subcutaneous BID  . metoprolol tartrate  50 mg Oral BID  . multivitamin with minerals  1 tablet Oral q morning - 10a  . simvastatin  40 mg Oral QHS  . sodium chloride  3 mL Intravenous Q12H   Continuous Infusions:  PRN Meds:.acetaminophen,  ALPRAZolam, morphine injection, ondansetron (ZOFRAN) IV, ondansetron, oxyCODONE    LOS: 7 days   Kyndall Amero,CHRISTOPHER  [facility] Pager [phone]. If 8PM-8AM, please contact night-coverage at [email or web address], password [facility] 01/11/2013, 8:10 AM  LOS: 7 days

## 2013-01-11 NOTE — Consult Note (Signed)
[facility] Consult Note  Referring Provider: No ref. provider found Primary Care Physician:  Melissa Blamer, MD Primary Gastroenterologist:  Dr.  Antony Contras Complaint: Rectal bleeding, anticipate need for anticoagulation HPI: [name] is an 67 y.o. white female  who is status post recent non-STMI with recent catheterization showing a tight right coronary lesion which will require stenting. She has had intermittent rectal bleeding for about a month more recently since she has been in the hospital with small amount of bright red blood with normally formed stools. She denies any melena. She has never had a colonoscopy or EGD. She denies any upper GI tract symptoms. He had back surgery 3 weeks ago.  Past Medical History  Diagnosis Date  . Diabetes mellitus   . Hypertension   . Hypercholesteremia     Past Surgical History  Procedure Laterality Date  . Eye surgery      laser , cat bil  . Tubal ligation    . Kyphoplasty  12/27/2012    Procedure: KYPHOPLASTY;  Surgeon: Emilee Hero, MD;  Location: [facility] OR;  Service: Orthopedics;  Laterality: Bilateral;  T-8 kyphoplasty    Medications Prior to Admission  Medication Sig Dispense Refill  . Cholecalciferol (VITAMIN D3) 1000 UNITS CAPS Take 2,000 Units by mouth every morning.       . furosemide (LASIX) 40 MG tablet Take 40 mg by mouth every morning.       . insulin glargine (LANTUS) 100 UNIT/ML injection Inject 10 Units into the skin 2 (two) times daily.       . metFORMIN (GLUCOPHAGE) 1000 MG tablet Take 1 tablet (1,000 mg total) by mouth 2 (two) times daily with a meal.  60 tablet  0  . Multiple Vitamin (MULITIVITAMIN WITH MINERALS) TABS Take 1 tablet by mouth every morning.       . ondansetron (ZOFRAN ODT) 8 MG disintegrating tablet Take 1 tablet (8 mg total) by mouth every 8 (eight) hours as needed for nausea.  20 tablet  0  . oxyCODONE-acetaminophen (PERCOCET/ROXICET) 5-325 MG per tablet Take 1 tablet by mouth every 4  (four) hours as needed. 1 to 2 tablets every 6 hours as needed for pain.      . pioglitazone (ACTOS) 30 MG tablet Take 30 mg by mouth every morning.      . ramipril (ALTACE) 10 MG capsule Take 10 mg by mouth daily at 12 noon.       . simvastatin (ZOCOR) 40 MG tablet Take 40 mg by mouth at bedtime.      . vitamin C (ASCORBIC ACID) 500 MG tablet Take 1,000 mg by mouth daily.        Allergies: No Known Allergies  History reviewed. No pertinent family history.  Social History:  reports that she has never smoked. She has never used smokeless tobacco. She reports that she does not drink alcohol or use illicit drugs.  Review of Systems: negative except as above   Blood pressure 122/80, pulse 87, temperature 98.6 F (37 C), temperature source Oral, resp. rate 19, height 5\' 8"  (1.727 m), weight 92 kg (202 lb 13.2 oz), SpO2 92.00%. Head: Normocephalic, without obvious abnormality, atraumatic Neck: no adenopathy, no carotid bruit, no JVD, supple, symmetrical, trachea midline and thyroid not enlarged, symmetric, no tenderness/mass/nodules Resp: clear to auscultation bilaterally Cardio: regular rate and rhythm, S1, S2 normal, no murmur, click, rub or gallop GI: Abdomen soft distended with normoactive bowel sounds. No hepatomegaly masses or guarding Extremities: extremities normal, atraumatic,  no cyanosis or edema  Results for orders placed during the hospital encounter of 01/04/13 (from the past 48 hour(s))  GLUCOSE, CAPILLARY     Status: Abnormal   Collection Time    01/09/13 12:15 PM      Result Value Range   Glucose-Capillary 140 (*) 70 - 99 mg/dL  GLUCOSE, CAPILLARY     Status: Abnormal   Collection Time    01/09/13  5:21 PM      Result Value Range   Glucose-Capillary 109 (*) 70 - 99 mg/dL  BASIC METABOLIC PANEL     Status: Abnormal   Collection Time    01/10/13  4:45 AM      Result Value Range   Sodium 139  135 - 145 mEq/L   Potassium 3.6  3.5 - 5.1 mEq/L   Chloride 104  96 - 112  mEq/L   CO2 26  19 - 32 mEq/L   Glucose, Bld 137 (*) 70 - 99 mg/dL   BUN 16  6 - 23 mg/dL   Creatinine, Ser 1.47  0.50 - 1.10 mg/dL   Calcium 8.6  8.4 - 82.9 mg/dL   GFR calc non Af Amer 89 (*) >90 mL/min   GFR calc Af Amer >90  >90 mL/min   Comment:            The eGFR has been calculated     using the CKD EPI equation.     This calculation has not been     validated in all clinical     situations.     eGFR's persistently     <90 mL/min signify     possible Chronic Kidney Disease.  CBC     Status: Abnormal   Collection Time    01/10/13  4:45 AM      Result Value Range   WBC 10.1  4.0 - 10.5 K/uL   RBC 3.67 (*) 3.87 - 5.11 MIL/uL   Hemoglobin 11.0 (*) 12.0 - 15.0 g/dL   HCT 56.2 (*) 13.0 - 86.5 %   MCV 87.5  78.0 - 100.0 fL   MCH 30.0  26.0 - 34.0 pg   MCHC 34.3  30.0 - 36.0 g/dL   RDW 78.4  69.6 - 29.5 %   Platelets 200  150 - 400 K/uL  GLUCOSE, CAPILLARY     Status: Abnormal   Collection Time    01/10/13  9:37 PM      Result Value Range   Glucose-Capillary 152 (*) 70 - 99 mg/dL  GLUCOSE, CAPILLARY     Status: Abnormal   Collection Time    01/11/13  5:53 AM      Result Value Range   Glucose-Capillary 119 (*) 70 - 99 mg/dL   No results found.  Assessment: Rectal bleeding in a 67 year old patient with no prior colon screening anticipating need for anticoagulation. Plan:  Colonoscopy is indicated and will prep and proceed with tomorrow. [name] C 01/11/2013, 11:27 AM

## 2013-01-11 NOTE — Progress Notes (Signed)
    SUBJECTIVE: No chest pain or SOB.   BP 150/78  Pulse 85  Temp(Src) 98.7 F (37.1 C) (Oral)  Resp 13  Ht 5\' 8"  (1.727 m)  Wt 205 lb 0.4 oz (93 kg)  BMI 31.18 kg/m2  SpO2 95%  Intake/Output Summary (Last 24 hours) at 01/11/13 4098 Last data filed at 01/10/13 1800  Gross per 24 hour  Intake    960 ml  Output   1125 ml  Net   -165 ml    PHYSICAL EXAM General: Well developed, well nourished, in no acute distress. Alert and oriented x 3.  Psych:  Good affect, responds appropriately Neck: No JVD. No masses noted.  Lungs: Clear bilaterally with no wheezes or rhonci noted.  Heart: RRR with no murmurs noted. Abdomen: Bowel sounds are present. Soft, non-tender.  Extremities: No lower extremity edema.   LABS: Basic Metabolic Panel:  Recent Labs  11/91/47 0435 01/10/13 0445  NA 141 139  K 3.6 3.6  CL 108 104  CO2 23 26  GLUCOSE 86 137*  BUN 16 16  CREATININE 0.69 0.65  CALCIUM 8.5 8.6   CBC:  Recent Labs  01/09/13 0435 01/10/13 0445  WBC 10.7* 10.1  HGB 10.8* 11.0*  HCT 32.5* 32.1*  MCV 89.0 87.5  PLT 186 200   Current Meds: . aspirin  325 mg Oral Daily  . cefTRIAXone (ROCEPHIN)  IV  1 g Intravenous Q0600  . feeding supplement  237 mL Oral BID BM  . insulin aspart  0-9 Units Subcutaneous TID WC  . insulin glargine  10 Units Subcutaneous BID  . metoprolol tartrate  50 mg Oral BID  . multivitamin with minerals  1 tablet Oral q morning - 10a  . simvastatin  40 mg Oral QHS  . sodium chloride  3 mL Intravenous Q12H     ASSESSMENT AND PLAN:  1. CAD/NSTEMI: Pt admitted with UTI, GI bleeding, diarrhea and abdominal pain and found to have NSTEMI. Cardiac cath 01/08/13 with severe RCA stenosis. Plans are to delay PCI of the RCA until her other medical conditions have stabilized. GI consult pending for bright red blood per rectum, worsened when on anticoagulation with heparin earlier in the admission. Stent placement is high risk in this patient with ongoing GI  blood loss, even though it is currently mild. Will await GI workup. If she is found to have hemorrhoidal bleeding, will then load with ASA and Plavix and proceed with PCI of RCA next week.   2. GI bleeding: Nursing and patient reports bright red bloods with each stool. Diarrhea resolving. This may be related to hemorrhoids but it is unsafe to proceed with PCI and stent placement until there is further workup of her GI bleeding. Recommend GI consult. If no invasive workup such as colonoscopy is recommended, then she would need a challenge with ASA and Plavix for 24-48 hours to make sure she does not have more bleeding. H/H stable.     MCALHANY,CHRISTOPHER  2/14/20147:09 AM

## 2013-01-12 ENCOUNTER — Encounter (HOSPITAL_COMMUNITY): Payer: Self-pay | Admitting: *Deleted

## 2013-01-12 ENCOUNTER — Encounter (HOSPITAL_COMMUNITY)
Admission: EM | Disposition: A | Discharge status: Home Health | Payer: Self-pay | Source: Home / Self Care | Attending: Internal Medicine

## 2013-01-12 HISTORY — PX: COLONOSCOPY: SHX5424

## 2013-01-12 LAB — GLUCOSE, CAPILLARY
Glucose-Capillary: 153 mg/dL — ABNORMAL HIGH (ref 70–99)
Glucose-Capillary: 88 mg/dL (ref 70–99)
Glucose-Capillary: 83 mg/dL (ref 70–99)
Glucose-Capillary: 104 mg/dL — ABNORMAL HIGH (ref 70–99)

## 2013-01-12 LAB — BASIC METABOLIC PANEL
CO2: 30 mEq/L (ref 19–32)
Chloride: 105 mEq/L (ref 96–112)
Glucose, Bld: 98 mg/dL (ref 70–99)
Sodium: 143 mEq/L (ref 135–145)

## 2013-01-12 LAB — CBC
Hemoglobin: 10.4 g/dL — ABNORMAL LOW (ref 12.0–15.0)
MCH: 29.5 pg (ref 26.0–34.0)
MCV: 89 fL (ref 78.0–100.0)
Platelets: 248 10*3/uL (ref 150–400)
RBC: 3.53 MIL/uL — ABNORMAL LOW (ref 3.87–5.11)
WBC: 8.6 10*3/uL (ref 4.0–10.5)

## 2013-01-12 SURGERY — COLONOSCOPY
Anesthesia: Moderate Sedation | Laterality: Left

## 2013-01-12 MED ORDER — POLYETHYLENE GLYCOL 3350 17 G PO PACK
17.0000 g | PACK | Freq: Every day | ORAL | Status: DC
  Administered 2013-01-13: 17 g via ORAL
  Filled 2013-01-12 (×4): qty 1

## 2013-01-12 MED ORDER — FENTANYL CITRATE 0.05 MG/ML IJ SOLN
INTRAMUSCULAR | Status: AC
  Filled 2013-01-12: qty 4

## 2013-01-12 MED ORDER — MIDAZOLAM HCL 5 MG/ML IJ SOLN
INTRAMUSCULAR | Status: AC
  Filled 2013-01-12: qty 3

## 2013-01-12 MED ORDER — MIDAZOLAM HCL 5 MG/5ML IJ SOLN
INTRAMUSCULAR | Status: DC | PRN
  Administered 2013-01-12 (×3): 2.5 mg via INTRAVENOUS

## 2013-01-12 MED ORDER — FENTANYL CITRATE 0.05 MG/ML IJ SOLN
INTRAMUSCULAR | Status: DC | PRN
  Administered 2013-01-12 (×3): 25 ug via INTRAVENOUS

## 2013-01-12 NOTE — Progress Notes (Signed)
Endo came to get patient this morning and noticed that only 1/3 of the prep was consumed. Patient stated that they told her that she didn't have to drink all of it by the night staff. Endo called to inform me that the colonoscopy prep was not sufficient and there was too much stool to proceed. Procedure will have to be rescheduled. Lajuana Matte, RN

## 2013-01-12 NOTE — Op Note (Signed)
[facility] [address] [place], [address]   FLEXIBLE SIGMOIDOSCOPY PROCEDURE REPORT  PATIENT: [name], [name]  MR#: [id number] BIRTHDATE: 1946/06/24 , 66  yrs. old GENDER: Female ENDOSCOPIST: Wandalee Ferdinand, MD REFERRED BY: PROCEDURE DATE:  01/12/2013 PROCEDURE:   flexible sigmoidoscopy ASA CLASS:   3 INDICATIONS:rectal bleeding MEDICATIONS: fentanyl 75 mcg IV, Versed 7.5 mg IV  DESCRIPTION OF PROCEDURE:   After the risks benefits and alternatives of the procedure were thoroughly explained, informed consent was obtained.  digital rectal exam was performed and was normal.      The EC-[id number] ([id number])  endoscope was introduced through the anus  and advanced to the sigmoid colon.      , limited by too much stool to adequately see and advance the scope further. No adverse events experienced.   The quality of the prep was inadequate.      .  The instrument was then slowly withdrawn as the mucosa was fully examined.       there were some diverticula noted in the sigmoid. 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             The scope was then withdrawn from the patient and the procedure terminated.  COMPLICATIONS: There were no complications.  ENDOSCOPIC IMPRESSION:diverticulosis, melanosis coli, inadequate prep  RECOMMENDATIONS: reprep, repeat attempt at colonoscopy Monday.   REPEAT EXAM:   _______________________________ Rosalie DoctorWandalee Ferdinand, MD 01/12/2013 10:39 AM   CC:

## 2013-01-12 NOTE — Progress Notes (Signed)
Patient ID: [name], female   DOB: December 30, 1945, 67 y.o.   MRN: [id number]   Cardiology continues to follow. Coronary intervention will be planned for a later date when the patient's other medical problems are stable.  Jerral Bonito, MD

## 2013-01-12 NOTE — Progress Notes (Signed)
[facility] PROGRESS NOTE  [name] ION:[id number] DOB: 04-11-1946 DOA: 01/04/2013 PCP: Johny Blamer, MD  Assessment/Plan: Principal Problem:   UTI (lower urinary tract infection) Active Problems:   Hyponatremia   Type II or unspecified type diabetes mellitus without mention of complication, not stated as uncontrolled   Hypokalemia   Fever   H/O vertebroplasty   SVT (supraventricular tachycardia)   Non-ST elevation myocardial infarction (NSTEMI)   Hematochezia   Coronary atherosclerosis of native coronary artery   Oral thrush    1. E coli UTI (lower urinary tract infection): Patient presented with a pyrexia of 103.1 and wcc of 13.8. Urinalysis revealed a positive urinary sediment, consistent with UTI. She was commenced on iv Rocephin, now day # 8. Urine culture positive for > 100K E.Coli pan-sensitive. Patient has no history of bladder prolapse, previous bladder surgeries or frequent urinary tract infections. Patient defervesced and as of 01/07/13, wcc has normalized, and patient feels better. Antibiotic discontinued on 01/11/13.  2. SVT (supraventricular tachycardia): Soon after admission, patient developed tachycardia, narrow complex SVT with rate of 200 bpm, elevated troponin 1.67, and mild ST depression in II and aVF. She had a subsequent spontaneous resolution of this with resolution of diffuse ST changes and no chest pain. Dr Rollene Rotunda provided cardiology consultation. The likely etiology is early sepsis and NSTEMI. Beta-blocker was commenced, with satisfactory effect. No recurrence.  3. NSTEMI/single vessel RCA stenosis:  As described above, patient's cardiac enzymes were found to be elevated at 1.67, following her episode of PSVT. 2 D ECHO realtively unremarkable but with grade I diastolic dysfunction. Cardiac cath 01/08/13 revealed severe RCA stenosis. She has been followed by Dr. Elease Hashimoto and plans are to delay PCI of the RCA until her other medical conditions have  stabilized.  4. Rectal bleed:  Nursing and patient reports bright red blood with each stool. This is possibly related to hemorrhoidal bleed in setting of anticoagulation. Hb is stable at this time, but as stent placement is deemed high risk in this patient with ongoing GI blood loss, GI evaluation is warranted, and Dr Dorena Cookey, has been consulted and colonoscopy is planned. No feasible on 01/12/13, due to poor bowel prep. Deferred to 01/14/13.  5. Hypotension/Hyponatremia/Hypokalemia: This was transient, and attributable to volume depletion and dehydration. Diuretics were held, iv fluids utilized, with resolution. Potassium repleted as indicated.  Anti-hypertensives cautiously restarted. Added Amlodipine on 01/11/13.  6. AKI: Patient has a bump in creatinine to 1.25 on 01/06/13 (Creatinine on admission was 0.76), most likely secondary to pre-renal azotemia. Now resolved.  7. Type II or unspecified type diabetes mellitus: Controlled on SSI/Lantus. HBA1C is 6.1.  8. Anemia of chronic disease: This is mild, normocytic. HB is stable and at patient's baseline. Following CBC.   9. Diarrhea: Transient, self-limited. Fortunately, stool C. Difficile PCR was negative.  9. Oral thrush: This was an incidental finding on physical examination on 01/11/13. For a 7-day course of Diflucan. Now day #2.    Code Status: Full Code.  Family Communication:  Disposition Plan: To be determined.    Brief narrative: 67 y.o. female with history of diabetes mellitus and hypertension, status post recent kyphoplasty (12/27/2012), presenting to [facility] ED 01/04/2013, with main concern of difficulty with ambulation at home, having sustained fall after trying to get up from the bed the afternoon just prior to admission. She called 911 and upon their arrival, they checked her temperature which was 103.1 F. In ED, UA consistent with UTI and [facility] asked  to admit for further evaluation. Upon arrival to the floor, pt developed tachycardia, narrow  complex SVT with rate of 200 bpm, elevated troponin 1.67, and mild ST depression in II and aVF. Cardiology consulted.    Consultants:  Dr Rollene Rotunda, cardiologist.   Dr Dorena Cookey, GI.   Procedures:  CXR.  X-Ray right shoulder.   Antibiotics:  Rocephin 01/04/13-01/11/13.   HPI/Subjective: No new issues.   Objective: Vital signs in last 24 hours: Temp:  [97.7 F (36.5 C)-98.3 F (36.8 C)] 97.7 F (36.5 C) (02/15 1355) Pulse Rate:  [67-77] 77 (02/15 1355) Resp:  [16-74] 18 (02/15 1355) BP: (125-196)/(63-91) 125/76 mmHg (02/15 1355) SpO2:  [89 %-96 %] 95 % (02/15 1355) Weight:  [87.4 kg (192 lb 10.9 oz)] 87.4 kg (192 lb 10.9 oz) (02/15 0359) Weight change: -4.6 kg (-10 lb 2.3 oz) Last BM Date: 01/12/13  Intake/Output from previous day: 02/14 0701 - 02/15 0700 In: 480 [P.O.:480] Out: 650 [Urine:650] Total I/O In: -  Out: 700 [Urine:700]   Physical Exam: General: Comfortable, alert, communicative, fully oriented, not short of breath at rest.  HEENT:  Mild clinical pallor, no jaundice, no conjunctival injection or discharge. Hydration is fair. Has mild oral thrush.  NECK:  Supple, JVP not seen, no carotid bruits, no palpable lymphadenopathy, no palpable goiter. CHEST:  Clinically clear to auscultation, no wheezes, no crackles. HEART:  Sounds 1 and 2 heard, normal, regular, no murmurs. ABDOMEN:  Full, soft, non-tender, no palpable organomegaly, no palpable masses, normal bowel sounds. GENITALIA:  Not examined. LOWER EXTREMITIES:  Mild pitting edema, palpable peripheral pulses. MUSCULOSKELETAL SYSTEM:  Generalized osteoarthritic changes, otherwise, normal. CENTRAL NERVOUS SYSTEM:  No focal neurologic deficit on gross examination.  Lab Results:  Recent Labs  01/10/13 0445 01/12/13 0500  WBC 10.1 8.6  HGB 11.0* 10.4*  HCT 32.1* 31.4*  PLT 200 248    Recent Labs  01/10/13 0445 01/12/13 0500  NA 139 143  K 3.6 3.7  CL 104 105  CO2 26 30  GLUCOSE 137*  98  BUN 16 9  CREATININE 0.65 0.72  CALCIUM 8.6 8.6   Recent Results (from the past 240 hour(s))  CULTURE, BLOOD (ROUTINE X 2)     Status: None   Collection Time    01/04/13  4:21 PM      Result Value Range Status   Specimen Description BLOOD RIGHT ARM   Final   Special Requests BOTTLES DRAWN AEROBIC AND ANAEROBIC 5CC EACH   Final   Culture  Setup Time 01/04/2013 22:30   Final   Culture NO GROWTH 5 DAYS   Final   Report Status 01/11/2013 FINAL   Final  CULTURE, BLOOD (ROUTINE X 2)     Status: None   Collection Time    01/04/13  4:33 PM      Result Value Range Status   Specimen Description BLOOD RIGHT HAND   Final   Special Requests BOTTLES DRAWN AEROBIC AND ANAEROBIC [facility] EACH   Final   Culture  Setup Time 01/04/2013 22:30   Final   Culture NO GROWTH 5 DAYS   Final   Report Status 01/11/2013 FINAL   Final  URINE CULTURE     Status: None   Collection Time    01/04/13  5:46 PM      Result Value Range Status   Specimen Description URINE, CATHETERIZED   Final   Special Requests NONE   Final   Culture  Setup Time 01/05/2013 04:26  Final   Colony Count >=100,000 COLONIES/ML   Final   Culture ESCHERICHIA COLI   Final   Report Status 01/06/2013 FINAL   Final   Organism ID, Bacteria ESCHERICHIA COLI   Final  CLOSTRIDIUM DIFFICILE BY PCR     Status: None   Collection Time    01/09/13  5:51 AM      Result Value Range Status   C difficile by pcr NEGATIVE  NEGATIVE Final     Studies/Results: No results found.  Medications: Scheduled Meds: . amLODipine  5 mg Oral Daily  . aspirin  325 mg Oral Daily  . feeding supplement  237 mL Oral BID BM  . fluconazole  100 mg Oral Daily  . insulin aspart  0-9 Units Subcutaneous TID WC  . insulin glargine  10 Units Subcutaneous BID  . metoprolol tartrate  50 mg Oral BID  . multivitamin with minerals  1 tablet Oral q morning - 10a  . polyethylene glycol  17 g Oral Daily  . simvastatin  40 mg Oral QHS  . sodium chloride  3 mL Intravenous  Q12H   Continuous Infusions: . sodium chloride 500 mL (01/12/13 1007)   PRN Meds:.acetaminophen, ALPRAZolam, morphine injection, ondansetron (ZOFRAN) IV, ondansetron, oxyCODONE    LOS: 8 days   Fantasia Jinkins,CHRISTOPHER  [facility] Pager [phone]. If 8PM-8AM, please contact night-coverage at [email or web address], password [facility] 01/12/2013, 5:50 PM  LOS: 8 days

## 2013-01-13 LAB — BASIC METABOLIC PANEL
Calcium: 8.8 mg/dL (ref 8.4–10.5)
GFR calc Af Amer: >90 mL/min (ref >90)
GFR calc non Af Amer: 87 mL/min — ABNORMAL LOW (ref >90)
Glucose, Bld: 97 mg/dL (ref 70–99)
Potassium: 3.5 mEq/L (ref 3.5–5.1)
Sodium: 140 mEq/L (ref 135–145)

## 2013-01-13 LAB — CBC
MCH: 29.3 pg (ref 26.0–34.0)
MCHC: 32.8 g/dL (ref 30.0–36.0)
Platelets: 291 10*3/uL (ref 150–400)
RDW: 13 % (ref 11.5–15.5)

## 2013-01-13 LAB — GLUCOSE, CAPILLARY
Glucose-Capillary: 89 mg/dL (ref 70–99)
Glucose-Capillary: 144 mg/dL — ABNORMAL HIGH (ref 70–99)
Glucose-Capillary: 94 mg/dL (ref 70–99)
Glucose-Capillary: 90 mg/dL (ref 70–99)

## 2013-01-13 MED ORDER — SODIUM CHLORIDE 0.9 % IV SOLN
INTRAVENOUS | Status: DC
  Administered 2013-01-13: 10:00:00 via INTRAVENOUS

## 2013-01-13 MED ORDER — PEG 3350-KCL-NA BICARB-NACL 420 G PO SOLR
4000.0000 mL | Freq: Once | ORAL | Status: AC
  Administered 2013-01-13: 4000 mL via ORAL
  Filled 2013-01-13: qty 4000

## 2013-01-13 NOTE — Progress Notes (Signed)
No complaints voice today. I reviewed EGD and colonoscopy with her. We will plan to do this tomorrow. I encourage her to drink all of her prep, to ensure that her colon is cleaned and that we get an adequate examination.

## 2013-01-13 NOTE — Progress Notes (Signed)
Correction on progress note just written. We are planning colonoscopy tomorrow, not EGD and colonoscopy.

## 2013-01-13 NOTE — Progress Notes (Signed)
[facility] PROGRESS NOTE  [name] ZOX:[id number] DOB: 31-Dec-1945 DOA: 01/04/2013 PCP: Johny Blamer, MD  Assessment/Plan: Principal Problem:   UTI (lower urinary tract infection) Active Problems:   Hyponatremia   Type II or unspecified type diabetes mellitus without mention of complication, not stated as uncontrolled   Hypokalemia   Fever   H/O vertebroplasty   SVT (supraventricular tachycardia)   Non-ST elevation myocardial infarction (NSTEMI)   Hematochezia   Coronary atherosclerosis of native coronary artery   Oral thrush    1. E coli UTI (lower urinary tract infection): Patient presented with a pyrexia of 103.1 and wcc of 13.8. Urinalysis revealed a positive urinary sediment, consistent with UTI. She was commenced on iv Rocephin, now day # 8. Urine culture positive for > 100K E.Coli pan-sensitive. Patient has no history of bladder prolapse, previous bladder surgeries or frequent urinary tract infections. Patient defervesced and as of 01/07/13, wcc had normalized, and patient feels better. Antibiotic discontinued on 01/11/13.  2. SVT (supraventricular tachycardia): Soon after admission, patient developed tachycardia, narrow complex SVT with rate of 200 bpm, elevated troponin 1.67, and mild ST depression in II and aVF. She had a subsequent spontaneous resolution of this with resolution of diffuse ST changes and no chest pain. Dr Rollene Rotunda provided cardiology consultation. The likely etiology is early sepsis and NSTEMI. Beta-blocker was commenced, with satisfactory effect. No recurrence.  3. NSTEMI/single vessel RCA stenosis:  As described above, patient's cardiac enzymes were found to be elevated at 1.67, following her episode of PSVT. 2 D ECHO realtively unremarkable but with grade I diastolic dysfunction. Cardiac cath 01/08/13 revealed severe RCA stenosis. She has been followed by Dr. Elease Hashimoto and plans are to delay PCI of the RCA until her other medical conditions have  stabilized.  4. Rectal bleed:  Nursing and patient reports bright red blood with each stool. This is possibly related to hemorrhoidal bleed in setting of anticoagulation. Hb is stable at this time, but as stent placement is deemed high risk in this patient with ongoing GI blood loss, GI evaluation is warranted, and Dr Dorena Cookey, has been consulted and colonoscopy is planned. Not feasible on 01/12/13, due to poor bowel prep. Deferred to 01/14/13.  5. Hypotension/Hyponatremia/Hypokalemia: This was transient, and attributable to volume depletion and dehydration. Diuretics were held, iv fluids utilized, with resolution. Potassium repleted as indicated.  Anti-hypertensives cautiously restarted. Added Amlodipine on 01/11/13.  6. AKI: Patient has a bump in creatinine to 1.25 on 01/06/13 (Creatinine on admission was 0.76), most likely secondary to pre-renal azotemia. Now resolved.  7. Type II or unspecified type diabetes mellitus: Controlled on SSI/Lantus. HBA1C is 6.1.  8. Anemia of chronic disease: This is mild, normocytic. HB is stable and at patient's baseline. Following CBC.   9. Diarrhea: Transient, self-limited. Fortunately, stool C. Difficile PCR was negative.  9. Oral thrush: This was an incidental finding on physical examination on 01/11/13. For a 7-day course of Diflucan. Now day #3.    Code Status: Full Code.  Family Communication:  Disposition Plan: To be determined.    Brief narrative: 67 y.o. female with history of diabetes mellitus and hypertension, status post recent kyphoplasty (12/27/2012), presenting to [facility] ED 01/04/2013, with main concern of difficulty with ambulation at home, having sustained fall after trying to get up from the bed the afternoon just prior to admission. She called 911 and upon their arrival, they checked her temperature which was 103.1 F. In ED, UA consistent with UTI and [facility] asked  to admit for further evaluation. Upon arrival to the floor, pt developed tachycardia, narrow  complex SVT with rate of 200 bpm, elevated troponin 1.67, and mild ST depression in II and aVF. Cardiology consulted.    Consultants:  Dr Rollene Rotunda, cardiologist.   Dr Dorena Cookey, GI.   Procedures:  CXR.  X-Ray right shoulder.   Antibiotics:  Rocephin 01/04/13-01/11/13.   HPI/Subjective: Asymptomatic.   Objective: Vital signs in last 24 hours: Temp:  [97.7 F (36.5 C)-98.2 F (36.8 C)] 98.2 F (36.8 C) (02/16 0435) Pulse Rate:  [67-77] 67 (02/16 0435) Resp:  [18-20] 20 (02/16 0435) BP: (125-160)/(60-76) 160/60 mmHg (02/16 0435) SpO2:  [93 %-96 %] 96 % (02/16 0435) Weight:  [86.546 kg (190 lb 12.8 oz)] 86.546 kg (190 lb 12.8 oz) (02/16 0634) Weight change: -0.854 kg (-1 lb 14.1 oz) Last BM Date: 01/12/13  Intake/Output from previous day: 02/15 0701 - 02/16 0700 In: -  Out: 1600 [Urine:1600]     Physical Exam: General: Comfortable, alert, communicative, fully oriented, not short of breath at rest.  HEENT:  Mild clinical pallor, no jaundice, no conjunctival injection or discharge. Hydration is fair. Has mild oral thrush.  NECK:  Supple, JVP not seen, no carotid bruits, no palpable lymphadenopathy, no palpable goiter. CHEST:  Clinically clear to auscultation, no wheezes, no crackles. HEART:  Sounds 1 and 2 heard, normal, regular, no murmurs. ABDOMEN:  Full, soft, non-tender, no palpable organomegaly, no palpable masses, normal bowel sounds. GENITALIA:  Not examined. LOWER EXTREMITIES:  Mild pitting edema, palpable peripheral pulses. MUSCULOSKELETAL SYSTEM:  Generalized osteoarthritic changes, otherwise, normal. CENTRAL NERVOUS SYSTEM:  No focal neurologic deficit on gross examination.  Lab Results:  Recent Labs  01/12/13 0500 01/13/13 0518  WBC 8.6 8.4  HGB 10.4* 9.9*  HCT 31.4* 30.2*  PLT 248 291    Recent Labs  01/12/13 0500 01/13/13 0518  NA 143 140  K 3.7 3.5  CL 105 103  CO2 30 27  GLUCOSE 98 97  BUN 9 7  CREATININE 0.72 0.69  CALCIUM  8.6 8.8   Recent Results (from the past 240 hour(s))  CULTURE, BLOOD (ROUTINE X 2)     Status: None   Collection Time    01/04/13  4:21 PM      Result Value Range Status   Specimen Description BLOOD RIGHT ARM   Final   Special Requests BOTTLES DRAWN AEROBIC AND ANAEROBIC 5CC EACH   Final   Culture  Setup Time 01/04/2013 22:30   Final   Culture NO GROWTH 5 DAYS   Final   Report Status 01/11/2013 FINAL   Final  CULTURE, BLOOD (ROUTINE X 2)     Status: None   Collection Time    01/04/13  4:33 PM      Result Value Range Status   Specimen Description BLOOD RIGHT HAND   Final   Special Requests BOTTLES DRAWN AEROBIC AND ANAEROBIC [facility] EACH   Final   Culture  Setup Time 01/04/2013 22:30   Final   Culture NO GROWTH 5 DAYS   Final   Report Status 01/11/2013 FINAL   Final  URINE CULTURE     Status: None   Collection Time    01/04/13  5:46 PM      Result Value Range Status   Specimen Description URINE, CATHETERIZED   Final   Special Requests NONE   Final   Culture  Setup Time 01/05/2013 04:26   Final   Colony Count >=100,000 COLONIES/ML  Final   Culture ESCHERICHIA COLI   Final   Report Status 01/06/2013 FINAL   Final   Organism ID, Bacteria ESCHERICHIA COLI   Final  CLOSTRIDIUM DIFFICILE BY PCR     Status: None   Collection Time    01/09/13  5:51 AM      Result Value Range Status   C difficile by pcr NEGATIVE  NEGATIVE Final     Studies/Results: No results found.  Medications: Scheduled Meds: . amLODipine  5 mg Oral Daily  . aspirin  325 mg Oral Daily  . feeding supplement  237 mL Oral BID BM  . fluconazole  100 mg Oral Daily  . insulin aspart  0-9 Units Subcutaneous TID WC  . insulin glargine  10 Units Subcutaneous BID  . metoprolol tartrate  50 mg Oral BID  . multivitamin with minerals  1 tablet Oral q morning - 10a  . polyethylene glycol  17 g Oral Daily  . simvastatin  40 mg Oral QHS  . sodium chloride  3 mL Intravenous Q12H   Continuous Infusions: . sodium chloride  500 mL (01/12/13 1007)  . sodium chloride 20 mL/hr at 01/13/13 1008   PRN Meds:.acetaminophen, ALPRAZolam, morphine injection, ondansetron (ZOFRAN) IV, ondansetron, oxyCODONE    LOS: 9 days   Ashleynicole Mcclees,CHRISTOPHER  [facility] Pager [phone]. If 8PM-8AM, please contact night-coverage at [email or web address], password [facility] 01/13/2013, 1:16 PM  LOS: 9 days

## 2013-01-14 ENCOUNTER — Encounter (HOSPITAL_COMMUNITY)
Admission: EM | Disposition: A | Discharge status: Home Health | Payer: Self-pay | Source: Home / Self Care | Attending: Internal Medicine

## 2013-01-14 ENCOUNTER — Encounter (HOSPITAL_COMMUNITY): Payer: Self-pay | Admitting: Gastroenterology

## 2013-01-14 DIAGNOSIS — B370 Candidal stomatitis: Secondary | ICD-10-CM

## 2013-01-14 HISTORY — PX: COLONOSCOPY: SHX5424

## 2013-01-14 LAB — GLUCOSE, CAPILLARY
Glucose-Capillary: 86 mg/dL (ref 70–99)
Glucose-Capillary: 88 mg/dL (ref 70–99)

## 2013-01-14 LAB — CBC
Hemoglobin: 10.9 g/dL — ABNORMAL LOW (ref 12.0–15.0)
MCH: 28.8 pg (ref 26.0–34.0)
Platelets: 306 10*3/uL (ref 150–400)
RBC: 3.78 MIL/uL — ABNORMAL LOW (ref 3.87–5.11)
WBC: 8.5 10*3/uL (ref 4.0–10.5)

## 2013-01-14 LAB — BASIC METABOLIC PANEL
CO2: 27 mEq/L (ref 19–32)
Calcium: 8.9 mg/dL (ref 8.4–10.5)
Chloride: 102 mEq/L (ref 96–112)
Glucose, Bld: 93 mg/dL (ref 70–99)
Sodium: 139 mEq/L (ref 135–145)

## 2013-01-14 SURGERY — COLONOSCOPY
Anesthesia: Moderate Sedation

## 2013-01-14 MED ORDER — FENTANYL CITRATE 0.05 MG/ML IJ SOLN
INTRAMUSCULAR | Status: AC
  Filled 2013-01-14: qty 2

## 2013-01-14 MED ORDER — SODIUM CHLORIDE 0.9 % IJ SOLN: 3.0000 mL | Freq: Two times a day (BID) | INTRAMUSCULAR | Status: DC

## 2013-01-14 MED ORDER — MIDAZOLAM HCL 5 MG/ML IJ SOLN
INTRAMUSCULAR | Status: AC
  Filled 2013-01-14: qty 1

## 2013-01-14 MED ORDER — CLOPIDOGREL BISULFATE 75 MG PO TABS
75.0000 mg | ORAL_TABLET | Freq: Every day | ORAL | Status: DC
  Administered 2013-01-15 – 2013-01-17 (×3): 75 mg via ORAL
  Filled 2013-01-14 (×6): qty 1

## 2013-01-14 MED ORDER — FENTANYL CITRATE 0.05 MG/ML IJ SOLN
INTRAMUSCULAR | Status: DC | PRN
  Administered 2013-01-14 (×3): 25 ug via INTRAVENOUS

## 2013-01-14 MED ORDER — SODIUM CHLORIDE 0.9 % IV SOLN
INTRAVENOUS | Status: DC
  Administered 2013-01-15: 50 mL/h via INTRAVENOUS

## 2013-01-14 MED ORDER — SODIUM CHLORIDE 0.9 % IJ SOLN
3.0000 mL | INTRAMUSCULAR | Status: DC | PRN
  Administered 2013-01-15: 3 mL via INTRAVENOUS

## 2013-01-14 MED ORDER — ASPIRIN 81 MG PO CHEW
324.0000 mg | CHEWABLE_TABLET | ORAL | Status: AC
  Administered 2013-01-15: 324 mg via ORAL
  Filled 2013-01-14: qty 4

## 2013-01-14 MED ORDER — SODIUM CHLORIDE 0.9 % IV SOLN: INTRAVENOUS | Status: DC

## 2013-01-14 MED ORDER — MIDAZOLAM HCL 5 MG/5ML IJ SOLN
INTRAMUSCULAR | Status: DC | PRN
  Administered 2013-01-14 (×3): 2 mg via INTRAVENOUS
  Administered 2013-01-14: 1 mg via INTRAVENOUS
  Administered 2013-01-14: 2 mg via INTRAVENOUS

## 2013-01-14 MED ORDER — POTASSIUM CHLORIDE CRYS ER 20 MEQ PO TBCR
40.0000 meq | EXTENDED_RELEASE_TABLET | Freq: Once | ORAL | Status: AC
  Administered 2013-01-14: 40 meq via ORAL
  Filled 2013-01-14: qty 2

## 2013-01-14 MED ORDER — SODIUM CHLORIDE 0.9 % IV SOLN: 250.0000 mL | INTRAVENOUS | Status: DC | PRN

## 2013-01-14 MED ORDER — CLOPIDOGREL BISULFATE 300 MG PO TABS
600.0000 mg | ORAL_TABLET | Freq: Once | ORAL | Status: AC
  Administered 2013-01-14: 600 mg via ORAL
  Filled 2013-01-14: qty 2

## 2013-01-14 NOTE — Progress Notes (Signed)
Patient assessed per flowsheet. Denies chest pain or shortness of breath. Patient bathed and cleaned of large green liquid stool; no bleeding noted. Gown and linens changed. [name]

## 2013-01-14 NOTE — Evaluation (Signed)
Occupational Therapy Evaluation Patient Details Name: [name] MRN: [id number] DOB: [birth year] Today's Date: 01/14/2013 Time: 4098-1191 OT Time Calculation (min): 27 min  OT Assessment / Plan / Recommendation Clinical Impression  67 yr old female admitted after fall subsequently also with UTI, and tachcardia.  Presents with decreased overall endurance and performance of basic selfcare tasks.  Will benefit from acute care OT services to help increase overall performance of selfcare tasks.  Feel pt will benefit from 3:1 and RW at discharge.  Also recommend HHOT to further progress independence.    OT Assessment  Patient needs continued OT Services    Follow Up Recommendations  Home health OT       Equipment Recommendations  3 in 1 bedside comode       Frequency  Min 2X/week    Precautions / Restrictions Precautions Precautions: Fall Precaution Comments: recent kyphoplasty in Jan. Restrictions Weight Bearing Restrictions: No   Pertinent Vitals/Pain O2 sats and HR stable during session    ADL  Eating/Feeding: Simulated;Independent Where Assessed - Eating/Feeding: Edge of bed Grooming: Performed;Minimal assistance Where Assessed - Grooming: Supported standing Upper Body Bathing: Simulated;Set up Where Assessed - Upper Body Bathing: Unsupported sitting Lower Body Bathing: Simulated;Minimal assistance Where Assessed - Lower Body Bathing: Supported sit to stand Upper Body Dressing: Simulated;Set up Where Assessed - Upper Body Dressing: Unsupported sitting Lower Body Dressing: Simulated;Moderate assistance Where Assessed - Lower Body Dressing: Supported sit to stand Toilet Transfer: Performed;Minimal Dentist Method: Other (comment) (ambulate with RW) Acupuncturist: Comfort height toilet;Grab bars Toileting - Clothing Manipulation and Hygiene: Performed;Minimal assistance Where Assessed - Engineer, mining and Hygiene: Sit to  stand from 3-in-1 or toilet Tub/Shower Transfer Method: Not assessed Equipment Used: Rolling walker Transfers/Ambulation Related to ADLs: Pt ambulates with min guard assist but requires min assist for sit to stand from lower surfaces.  Needs mod instructional cueing to stand up tall and stay inside of the RW with mobility. ADL Comments: Pt with decreased efficiency with reaching either foot for dressing tasks.  Pt politely asked therapist to assist her with this.  She was very distracted by not seeing the MD yet and wanting to know what is coming next with her stay.  Will benefit from 3:1 for home use secondary to decreased efficiency with sit to stand from low surfaces.    OT Diagnosis: Generalized weakness  OT Problem List: Decreased strength;Decreased activity tolerance;Impaired balance (sitting and/or standing);Decreased knowledge of use of DME or AE OT Treatment Interventions: Self-care/ADL training;DME and/or AE instruction;Energy conservation;Patient/family education;Balance training;Therapeutic activities   OT Goals Acute Rehab OT Goals OT Goal Formulation: With patient Time For Goal Achievement: 01/28/13 Potential to Achieve Goals: Good ADL Goals Pt Will Perform Grooming: with supervision;Supported;Other (comment) (3 tasks) ADL Goal: Grooming - Progress: Goal set today Pt Will Perform Lower Body Bathing: with supervision;Sit to stand from bed ADL Goal: Lower Body Bathing - Progress: Goal set today Pt Will Perform Lower Body Dressing: with supervision;Sit to stand from bed ADL Goal: Lower Body Dressing - Progress: Goal set today Pt Will Transfer to Toilet: with supervision;3-in-1;with DME ADL Goal: Toilet Transfer - Progress: Goal set today Pt Will Perform Toileting - Clothing Manipulation: with supervision;Sitting on 3-in-1 or toilet;Standing ADL Goal: Toileting - Clothing Manipulation - Progress: Goal set today Pt Will Perform Toileting - Hygiene: with supervision;Sit to stand  from 3-in-1/toilet ADL Goal: Toileting - Hygiene - Progress: Goal set today Pt Will Perform Tub/Shower Transfer: with supervision;with DME;Ambulation ADL  Goal: Tub/Shower Transfer - Progress: Goal set today Miscellaneous OT Goals Miscellaneous OT Goal #1: Pt will verbalize at least 3 energy conservation strategies for selfcare tasks with independence. OT Goal: Miscellaneous Goal #1 - Progress: Goal set today  Visit Information  Last OT Received On: 01/14/13 Assistance Needed: +1    Subjective Data  Subjective: "I want to know the cardiology group so I can call them, just get me their number." Patient Stated Goal: Did not state   Prior Functioning     Home Living Lives With: Spouse Available Help at Discharge: Family;Available 24 hours/day Type of Home: House Home Layout: One level Bathroom Shower/Tub: Engineer, manufacturing systems: Standard Home Adaptive Equipment: Walker - rolling;Shower chair with back Prior Function Level of Independence: Independent Able to Take Stairs?: Yes Driving: Yes Vocation: Retired Musician: No difficulties Dominant Hand: Right         Vision/Perception Vision - History Baseline Vision: Wears glasses for distance only Patient Visual Report: No change from baseline Vision - Assessment Eye Alignment: Within Functional Limits Vision Assessment: Vision not tested Perception Perception: Not tested Praxis Praxis: Not tested   Cognition  Cognition Overall Cognitive Status: Appears within functional limits for tasks assessed/performed Area of Impairment: Attention Arousal/Alertness: Awake/alert Orientation Level: Appears intact for tasks assessed Behavior During Session: [facility] for tasks performed Current Attention Level: Sustained Following Commands: Follows multi-step commands inconsistently Safety/Judgement: Impulsive;Decreased safety judgement for tasks assessed Safety/Judgement - Other Comments: Pt needs mod  instructional cues for using the walker correctly and for positioning hands correctly with sit to stand transitions.    Extremity/Trunk Assessment Right Upper Extremity Assessment RUE ROM/Strength/Tone: WFL for tasks assessed Left Upper Extremity Assessment LUE ROM/Strength/Tone: WFL for tasks assessed Trunk Assessment Trunk Assessment: Kyphotic     Mobility Bed Mobility Bed Mobility: Supine to Sit Supine to Sit: 5: Supervision;With rails Transfers Transfers: Sit to Stand Sit to Stand: With upper extremity assist;From bed;4: Min assist Stand to Sit: To toilet;4: Min assist Details for Transfer Assistance: Pt needs mod instructional cueing for hand placement with sit to stand.          Balance Balance Balance Assessed: Yes Static Standing Balance Static Standing - Balance Support: Right upper extremity supported;Left upper extremity supported Static Standing - Level of Assistance: 5: Stand by assistance   End of Session OT - End of Session Activity Tolerance: Patient limited by fatigue Patient left: in bed;with call bell/phone within reach;with family/visitor present Nurse Communication: Mobility status     Pauline Trainer OTR/L Pager number [phone] 01/14/2013, 3:25 PM

## 2013-01-14 NOTE — Progress Notes (Signed)
    SUBJECTIVE: No chest pain or SOB.   BP 148/63  Pulse 68  Temp(Src) 98.2 F (36.8 C) (Oral)  Resp 26  Ht 5\' 8"  (1.727 m)  Wt 189 lb 13.1 oz (86.1 kg)  BMI 28.87 kg/m2  SpO2 91%  Intake/Output Summary (Last 24 hours) at 01/14/13 1122 Last data filed at 01/14/13 0730  Gross per 24 hour  Intake      0 ml  Output    400 ml  Net   -400 ml    PHYSICAL EXAM General: Well developed, well nourished, in no acute distress. Alert and oriented x 3.  Psych:  Good affect, responds appropriately Neck: No JVD. No masses noted.  Lungs: Clear bilaterally with no wheezes or rhonci noted.  Heart: RRR with no murmurs noted. Abdomen: Bowel sounds are present. Soft, non-tender.  Extremities: No lower extremity edema.   LABS: Basic Metabolic Panel:  Recent Labs  16/10/96 0518 01/14/13 0555  NA 140 139  K 3.5 3.4*  CL 103 102  CO2 27 27  GLUCOSE 97 93  BUN 7 5*  CREATININE 0.69 0.70  CALCIUM 8.8 8.9   CBC:  Recent Labs  01/13/13 0518 01/14/13 0555  WBC 8.4 8.5  HGB 9.9* 10.9*  HCT 30.2* 33.9*  MCV 89.3 89.7  PLT 291 306   Current Meds: . amLODipine  5 mg Oral Daily  . aspirin  325 mg Oral Daily  . feeding supplement  237 mL Oral BID BM  . fluconazole  100 mg Oral Daily  . insulin aspart  0-9 Units Subcutaneous TID WC  . insulin glargine  10 Units Subcutaneous BID  . metoprolol tartrate  50 mg Oral BID  . multivitamin with minerals  1 tablet Oral q morning - 10a  . polyethylene glycol  17 g Oral Daily  . simvastatin  40 mg Oral QHS  . sodium chloride  3 mL Intravenous Q12H     ASSESSMENT AND PLAN:  1. CAD/NSTEMI: Pt admitted with UTI, GI bleeding, diarrhea and abdominal pain and found to have NSTEMI. Seen in consultation 01/05/13 by Dr. Antoine Poche. Cardiac cath 01/08/13 per Dr. Elease Hashimoto with severe RCA stenosis. PCI of the RCA has been delayed until medical issues stable. GI consult for bright red blood per rectum, worsened when on anticoagulation with heparin earlier in  the admission. Colonoscopy this am with findings listed in procedure note. GI has recommended to proceed with PCI. Will load with Plavix today and continue ASA. If no further bleeding in am, will proceed with PCI of the RCA tomorrow. NPO at midnight.   2. GI bleeding: Colonoscopy this am. See results. No further GI bleeding.   [name]  2/17/201411:22 AM

## 2013-01-14 NOTE — Progress Notes (Signed)
Received from Endoscopy s/p colonoscopy. Patient alert/oriented. Denies any distress at this time. Assisted to bsc to void. Returned to bed. Call bell and family near. Mamie Levers

## 2013-01-14 NOTE — Progress Notes (Signed)
[facility] PROGRESS NOTE  [name] ZOX:[id number] DOB: [birth year] DOA: 01/04/2013 PCP: Johny Blamer, MD  Assessment/Plan: Principal Problem:   UTI (lower urinary tract infection) Active Problems:   Hyponatremia   Type II or unspecified type diabetes mellitus without mention of complication, not stated as uncontrolled   Hypokalemia   Fever   H/O vertebroplasty   SVT (supraventricular tachycardia)   Non-ST elevation myocardial infarction (NSTEMI)   Hematochezia   Coronary atherosclerosis of native coronary artery   Oral thrush    1. E coli UTI (lower urinary tract infection): Patient presented with a pyrexia of 103.1 and wcc of 13.8. Urinalysis revealed a positive urinary sediment, consistent with UTI. She was commenced on iv Rocephin, now day # 8. Urine culture positive for > 100K E.Coli pan-sensitive. Patient has no history of bladder prolapse, previous bladder surgeries or frequent urinary tract infections. Patient defervesced and as of 01/07/13, wcc had normalized, and patient feels better. Antibiotic discontinued on 01/11/13.  2. SVT (supraventricular tachycardia): Soon after admission, patient developed tachycardia, narrow complex SVT with rate of 200 bpm, elevated troponin 1.67, and mild ST depression in II and aVF. She had a subsequent spontaneous resolution of this with resolution of diffuse ST changes and no chest pain. Dr Rollene Rotunda provided cardiology consultation. The likely etiology is early sepsis and NSTEMI. Beta-blocker was commenced, with satisfactory effect. No recurrence.  3. NSTEMI/single vessel RCA stenosis:  As described above, patient's cardiac enzymes were found to be elevated at 1.67, following her episode of PSVT. 2 D ECHO realtively unremarkable but with grade I diastolic dysfunction. Cardiac cath 01/08/13 revealed severe RCA stenosis. Dr Rollene Rotunda provided cardiology consultation, an patient was subsequently seen by Dr Elease Hashimoto. PCI is planned,  but procedure was delayed, pending GI evaluation which was concluded today. PCI now scheduled for 01/15/13. 4. Rectal bleed:  Nursing and patient reports bright red blood with each stool. This is possibly related to hemorrhoidal bleed in setting of anticoagulation. Hb is stable at this time, but as stent placement is deemed high risk in this patient with ongoing GI blood loss, GI evaluation is warranted, and Dr Dorena Cookey, has been consulted and colonoscopy is planned. Not feasible on 01/12/13, due to poor bowel prep. Dr Herbert Moors performed a colonoscopy on 01/14/13, which revealed a linear ulceration and a couple of small ulcers in the distal rectum. Biopsy was obtained from the ulcerated area. This appeared completely benign and could be a stercoral ulcer or from ischemia. Patient cleared from GI stand point, for PCI.  5. Hypotension/Hyponatremia/Hypokalemia: This was transient, and attributable to volume depletion and dehydration. Diuretics were held, iv fluids utilized, with resolution. Potassium repleted as indicated.  Anti-hypertensives cautiously restarted. Added Amlodipine on 01/11/13.  6. AKI: Patient has a bump in creatinine to 1.25 on 01/06/13 (Creatinine on admission was 0.76), most likely secondary to pre-renal azotemia. Now resolved.  7. Type II or unspecified type diabetes mellitus: Controlled on SSI/Lantus. HBA1C is 6.1.  8. Anemia of chronic disease: This is mild, normocytic. HB is stable and at patient's baseline. Following CBC.   9. Diarrhea: Transient, self-limited. Fortunately, stool C. Difficile PCR was negative.  9. Oral thrush: This was an incidental finding on physical examination on 01/11/13. For a 7-day course of Diflucan. Now day #4.    Code Status: Full Code.  Family Communication:  Disposition Plan: To be determined.    Brief narrative: 67 y.o. female with history of diabetes mellitus and hypertension, status post recent  kyphoplasty (12/27/2012), presenting to [facility] ED 01/04/2013,  with main concern of difficulty with ambulation at home, having sustained fall after trying to get up from the bed the afternoon just prior to admission. She called 911 and upon their arrival, they checked her temperature which was 103.1 F. In ED, UA consistent with UTI and [facility] asked to admit for further evaluation. Upon arrival to the floor, pt developed tachycardia, narrow complex SVT with rate of 200 bpm, elevated troponin 1.67, and mild ST depression in II and aVF. Cardiology consulted.    Consultants:  Dr Rollene Rotunda, cardiologist.   Dr Dorena Cookey, GI.   Procedures:  CXR.  X-Ray right shoulder.   Antibiotics:  Rocephin 01/04/13-01/11/13.   HPI/Subjective: No new issues. Tolerated colonoscopy well.   Objective: Vital signs in last 24 hours: Temp:  [97.7 F (36.5 C)-98.4 F (36.9 C)] 98.2 F (36.8 C) (02/17 0847) Pulse Rate:  [68-73] 68 (02/17 0847) Resp:  [14-54] 26 (02/17 1020) BP: (134-171)/(63-88) 148/63 mmHg (02/17 1100) SpO2:  [91 %-98 %] 94 % (02/17 1510) Weight:  [86.1 kg (189 lb 13.1 oz)] 86.1 kg (189 lb 13.1 oz) (02/17 0600) Weight change: -0.446 kg (-15.7 oz) Last BM Date: 01/14/13  Intake/Output from previous day: 02/16 0701 - 02/17 0700 In: -  Out: 400 [Urine:400]     Physical Exam: General: Comfortable, alert, communicative, fully oriented, not short of breath at rest.  HEENT:  Mild clinical pallor, no jaundice, no conjunctival injection or discharge. Hydration is fair. Oral thrush has practically resolved.   NECK:  Supple, JVP not seen, no carotid bruits, no palpable lymphadenopathy, no palpable goiter. CHEST:  Clinically clear to auscultation, no wheezes, no crackles. HEART:  Sounds 1 and 2 heard, normal, regular, no murmurs. ABDOMEN:  Full, soft, non-tender, no palpable organomegaly, no palpable masses, normal bowel sounds. GENITALIA:  Not examined. LOWER EXTREMITIES:  Mild pitting edema, palpable peripheral pulses. MUSCULOSKELETAL SYSTEM:   Generalized osteoarthritic changes, otherwise, normal. CENTRAL NERVOUS SYSTEM:  No focal neurologic deficit on gross examination.  Lab Results:  Recent Labs  01/13/13 0518 01/14/13 0555  WBC 8.4 8.5  HGB 9.9* 10.9*  HCT 30.2* 33.9*  PLT 291 306    Recent Labs  01/13/13 0518 01/14/13 0555  NA 140 139  K 3.5 3.4*  CL 103 102  CO2 27 27  GLUCOSE 97 93  BUN 7 5*  CREATININE 0.69 0.70  CALCIUM 8.8 8.9   Recent Results (from the past 240 hour(s))  CULTURE, BLOOD (ROUTINE X 2)     Status: None   Collection Time    01/04/13  4:21 PM      Result Value Range Status   Specimen Description BLOOD RIGHT ARM   Final   Special Requests BOTTLES DRAWN AEROBIC AND ANAEROBIC 5CC EACH   Final   Culture  Setup Time 01/04/2013 22:30   Final   Culture NO GROWTH 5 DAYS   Final   Report Status 01/11/2013 FINAL   Final  CULTURE, BLOOD (ROUTINE X 2)     Status: None   Collection Time    01/04/13  4:33 PM      Result Value Range Status   Specimen Description BLOOD RIGHT HAND   Final   Special Requests BOTTLES DRAWN AEROBIC AND ANAEROBIC [facility] EACH   Final   Culture  Setup Time 01/04/2013 22:30   Final   Culture NO GROWTH 5 DAYS   Final   Report Status 01/11/2013 FINAL   Final  URINE CULTURE     Status: None   Collection Time    01/04/13  5:46 PM      Result Value Range Status   Specimen Description URINE, CATHETERIZED   Final   Special Requests NONE   Final   Culture  Setup Time 01/05/2013 04:26   Final   Colony Count >=100,000 COLONIES/ML   Final   Culture ESCHERICHIA COLI   Final   Report Status 01/06/2013 FINAL   Final   Organism ID, Bacteria ESCHERICHIA COLI   Final  CLOSTRIDIUM DIFFICILE BY PCR     Status: None   Collection Time    01/09/13  5:51 AM      Result Value Range Status   C difficile by pcr NEGATIVE  NEGATIVE Final     Studies/Results: No results found.  Medications: Scheduled Meds: . amLODipine  5 mg Oral Daily  . aspirin  325 mg Oral Daily  . feeding  supplement  237 mL Oral BID BM  . fluconazole  100 mg Oral Daily  . insulin aspart  0-9 Units Subcutaneous TID WC  . insulin glargine  10 Units Subcutaneous BID  . metoprolol tartrate  50 mg Oral BID  . multivitamin with minerals  1 tablet Oral q morning - 10a  . polyethylene glycol  17 g Oral Daily  . simvastatin  40 mg Oral QHS  . sodium chloride  3 mL Intravenous Q12H   Continuous Infusions: . sodium chloride 500 mL (01/12/13 1007)  . sodium chloride 20 mL/hr at 01/13/13 1008   PRN Meds:.acetaminophen, ALPRAZolam, morphine injection, ondansetron (ZOFRAN) IV, ondansetron, oxyCODONE    LOS: 10 days   Wirt Hemmerich,CHRISTOPHER  [facility] Pager [phone]. If 8PM-8AM, please contact night-coverage at [email or web address], password [facility] 01/14/2013, 3:38 PM  LOS: 10 days

## 2013-01-14 NOTE — Op Note (Signed)
[facility] [address] [place], [address]   COLONOSCOPY PROCEDURE REPORT  PATIENT: [name], [name]  MR#: [id number] BIRTHDATE: Feb 15, 1946 , 66  yrs. old GENDER: Female ENDOSCOPIST: Wandalee Ferdinand, MD REFERRED BY: PROCEDURE DATE:  01/14/2013 PROCEDURE:   colonoscopy with biopsy ASA CLASS:   3 INDICATIONS:rectal bleeding MEDICATIONS: fentanyl 75 mcg IV, Versed 9 mg IV  DESCRIPTION OF PROCEDURE:   After the risks benefits and alternatives of the procedure were thoroughly explained, informed consent was obtained.  digital rectal exam was performed and was normal        The Pentax Ped Colon [id number]  endoscope was introduced through the anus and advanced to the cecum     . No adverse events experienced.   The quality of the prep was adequate prep.       The instrument was then slowly withdrawn as the colon was fully examined.    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 There were scattered moderate sized diverticuli throughout the colon from sigmoid all the way into the ascending colon region. There was a linear ulceration in the distal rectum, and a couple of small ulcers in the distal rectum. Biopsy was obtained from the ulcerated area. This appeared completely benign. This could be a stercoral ulcer or from ischemia.       no lesions seen on retroflexion in the rectum      The time to cecum=  . Withdrawal time=  .  The scope was withdrawn and the procedure completed. COMPLICATIONS: There were no complications.  ENDOSCOPIC IMPRESSION:  #1. Diffuse melanosis coli  #2. Diverticulosis  #3. Ulceration in the rectum see above RECOMMENDATIONS: follow clinically. Check biopsies. Proceed with further cardiac evaluation. I see no reason based on the colonoscopy findings that she could not be anticoagulated per cardiology if necessary.  eSigned:  Wandalee Ferdinand, MD 01/14/2013 9:48 AM   cc:

## 2013-01-15 ENCOUNTER — Encounter (HOSPITAL_COMMUNITY): Payer: Self-pay | Admitting: Gastroenterology

## 2013-01-15 ENCOUNTER — Encounter (HOSPITAL_COMMUNITY)
Admission: EM | Disposition: A | Discharge status: Home Health | Payer: Self-pay | Source: Home / Self Care | Attending: Internal Medicine

## 2013-01-15 DIAGNOSIS — I214 Non-ST elevation (NSTEMI) myocardial infarction: Secondary | ICD-10-CM

## 2013-01-15 DIAGNOSIS — I2510 Atherosclerotic heart disease of native coronary artery without angina pectoris: Secondary | ICD-10-CM

## 2013-01-15 HISTORY — PX: PERCUTANEOUS STENT INTERVENTION: SHX5500

## 2013-01-15 LAB — BASIC METABOLIC PANEL
BUN: 8 mg/dL (ref 6–23)
CO2: 25 mEq/L (ref 19–32)
Calcium: 9.1 mg/dL (ref 8.4–10.5)
Creatinine, Ser: 0.68 mg/dL (ref 0.50–1.10)
GFR calc non Af Amer: 88 mL/min — ABNORMAL LOW (ref >90)
Glucose, Bld: 137 mg/dL — ABNORMAL HIGH (ref 70–99)
Sodium: 140 mEq/L (ref 135–145)

## 2013-01-15 LAB — CBC
Hemoglobin: 11 g/dL — ABNORMAL LOW (ref 12.0–15.0)
MCH: 29.3 pg (ref 26.0–34.0)
MCHC: 32.8 g/dL (ref 30.0–36.0)
MCV: 89.3 fL (ref 78.0–100.0)

## 2013-01-15 LAB — GLUCOSE, CAPILLARY
Glucose-Capillary: 81 mg/dL (ref 70–99)
Glucose-Capillary: 87 mg/dL (ref 70–99)
Glucose-Capillary: 111 mg/dL — ABNORMAL HIGH (ref 70–99)

## 2013-01-15 SURGERY — PERCUTANEOUS STENT INTERVENTION
Anesthesia: LOCAL

## 2013-01-15 MED ORDER — LIDOCAINE HCL (PF) 1 % IJ SOLN
INTRAMUSCULAR | Status: AC
  Filled 2013-01-15: qty 30

## 2013-01-15 MED ORDER — MIDAZOLAM HCL 2 MG/2ML IJ SOLN
INTRAMUSCULAR | Status: AC
  Filled 2013-01-15: qty 2

## 2013-01-15 MED ORDER — BIVALIRUDIN 250 MG IV SOLR
INTRAVENOUS | Status: AC
  Filled 2013-01-15: qty 250

## 2013-01-15 MED ORDER — FENTANYL CITRATE 0.05 MG/ML IJ SOLN
INTRAMUSCULAR | Status: AC
  Filled 2013-01-15: qty 2

## 2013-01-15 MED ORDER — HEPARIN (PORCINE) IN NACL 2-0.9 UNIT/ML-% IJ SOLN
INTRAMUSCULAR | Status: AC
  Filled 2013-01-15: qty 1000

## 2013-01-15 MED ORDER — VERAPAMIL HCL 2.5 MG/ML IV SOLN
INTRAVENOUS | Status: AC
  Filled 2013-01-15: qty 2

## 2013-01-15 MED ORDER — SODIUM CHLORIDE 0.9 % IV SOLN
1.0000 mL/kg/h | INTRAVENOUS | Status: AC
  Administered 2013-01-15: 0.999 mL/kg/h via INTRAVENOUS

## 2013-01-15 MED ORDER — SODIUM CHLORIDE 0.9 % IV SOLN
0.2500 mg/kg/h | INTRAVENOUS | Status: DC
  Filled 2013-01-15: qty 250

## 2013-01-15 MED ORDER — ATROPINE SULFATE 1 MG/ML IJ SOLN
INTRAMUSCULAR | Status: AC
  Filled 2013-01-15: qty 1

## 2013-01-15 MED ORDER — PROMETHAZINE HCL 25 MG/ML IJ SOLN
25.0000 mg | Freq: Four times a day (QID) | INTRAMUSCULAR | Status: DC | PRN
  Administered 2013-01-15: 25 mg via INTRAVENOUS
  Filled 2013-01-15 (×2): qty 1

## 2013-01-15 MED ORDER — NITROGLYCERIN 1 MG/10 ML FOR IR/CATH LAB
INTRA_ARTERIAL | Status: AC
  Filled 2013-01-15: qty 10

## 2013-01-15 NOTE — Progress Notes (Signed)
Physical Therapy Treatment Patient Details Name: [name] MRN: [id number] DOB: [birth year] Today's Date: 01/15/2013 Time: 9147-8295 PT Time Calculation (min): 20 min  PT Assessment / Plan / Recommendation Comments on Treatment Session  Pt admit with UTI and NSTEMI wtih recent kyphoplasty on 1/30 at [facility] with a fall.  Poor safety awareness.  Pt deferred all further mobility due to going to [facility] then home.    Follow Up Recommendations  Home health PT;Supervision/Assistance - 24 hour     Does the patient have the potential to tolerate intense rehabilitation     Barriers to Discharge        Equipment Recommendations  Rolling walker with 5" wheels    Recommendations for Other Services    Frequency Min 3X/week   Plan Discharge plan remains appropriate;Frequency remains appropriate    Precautions / Restrictions Precautions Precautions: Fall Restrictions Weight Bearing Restrictions: No   Pertinent Vitals/Pain     Mobility  Bed Mobility Bed Mobility: Supine to Sit;Sitting - Scoot to Edge of Bed;Sit to Supine Supine to Sit: 5: Supervision;With rails Sitting - Scoot to Edge of Bed: 5: Supervision Sit to Supine: 5: Supervision Details for Bed Mobility Assistance: up via R elbow, but use o rail.  pt too distractible to listen to cues. Transfers Transfers: Sit to Stand;Stand to [employer] Transfers Sit to Stand: 4: Min guard;With upper extremity assist;From bed Stand to Sit: 4: Min guard;With upper extremity assist;To bed Stand Pivot Transfers: 4: Min guard Details for Transfer Assistance: needed cuing for hand placement and some unsafe choices (ie pivoting on a towel) Ambulation/Gait Ambulation/Gait Assistance:  (pt deferred)    Exercises     PT Diagnosis:    PT Problem List:   PT Treatment Interventions:     PT Goals Acute Rehab PT Goals Time For Goal Achievement: 01/21/13 Potential to Achieve Goals: Good Pt will go Supine/Side to Sit: with  supervision PT Goal: Supine/Side to Sit - Progress: Met Pt will go Sit to Supine/Side: with supervision PT Goal: Sit to Supine/Side - Progress: Met Pt will go Sit to Stand: with supervision PT Goal: Sit to Stand - Progress: Progressing toward goal Pt will go Stand to Sit: with supervision PT Goal: Stand to Sit - Progress: Progressing toward goal PT Goal: Ambulate - Progress: Not met  Visit Information  Last PT Received On: 01/15/13 Assistance Needed: +1    Subjective Data  Subjective: [name], I'm not going to do exercise today because I'm going to surgery, but I would like to go to the bathroom to pee Patient Stated Goal: to get home tomorrow.   Cognition  Cognition Overall Cognitive Status: Appears within functional limits for tasks assessed/performed Area of Impairment: Attention Arousal/Alertness: Awake/alert Orientation Level: Appears intact for tasks assessed Behavior During Session: [facility] for tasks performed Current Attention Level: Sustained Following Commands: Follows multi-step commands inconsistently Safety/Judgement: Impulsive;Decreased safety judgement for tasks assessed Safety/Judgement - Other Comments: Pt needs mod instructional cues for using the walker correctly and for positioning hands correctly with sit to stand transitions.    Balance  Balance Balance Assessed: No  End of Session PT - End of Session Activity Tolerance: Patient tolerated treatment well Patient left: in bed;with call bell/phone within reach;with family/visitor present Nurse Communication: Mobility status   GP     [name], [name] 01/15/2013, 11:01 AM  01/15/2013  [place] Bing, PT [phone] (pager)01/15/2013  [place] Bing, PT [phone] (pager)

## 2013-01-15 NOTE — Progress Notes (Signed)
 SUBJECTIVE:  No chest pain.  No SOB   PHYSICAL EXAM Filed Vitals:   01/14/13 1100 01/14/13 1510 01/14/13 2055 01/15/13 0547  BP: 148/63  130/63 137/66  Pulse:   73 81  Temp:   99 F (37.2 C) 98.9 F (37.2 C)  TempSrc:   Oral Oral  Resp:   20 19  Height:      Weight:    189 lb 13.1 oz (86.1 kg)  SpO2:  94% 92% 94%   General:  No distress Lungs:  Clear Heart:  RRR Abdomen:  Positive bowel sounds, no rebound no guarding Extremities:  No edema  LABS: Lab Results  Component Value Date   CKTOTAL 40 01/06/2013   CKMB 9.0* 01/06/2013   TROPONINI 2.78* 01/06/2013   Results for orders placed during the hospital encounter of 01/04/13 (from the past 24 hour(s))  GLUCOSE, CAPILLARY     Status: None   Collection Time    01/14/13 11:36 AM      Result Value Range   Glucose-Capillary 88  70 - 99 mg/dL   Comment 1 Documented in Chart     Comment 2 Notify RN    GLUCOSE, CAPILLARY     Status: Abnormal   Collection Time    01/14/13  4:13 PM      Result Value Range   Glucose-Capillary 107 (*) 70 - 99 mg/dL   Comment 1 Notify RN     Comment 2 Documented in Chart    GLUCOSE, CAPILLARY     Status: Abnormal   Collection Time    01/14/13  8:53 PM      Result Value Range   Glucose-Capillary 165 (*) 70 - 99 mg/dL   Comment 1 Notify RN    CBC     Status: Abnormal   Collection Time    01/15/13  5:20 AM      Result Value Range   WBC 10.6 (*) 4.0 - 10.5 K/uL   RBC 3.75 (*) 3.87 - 5.11 MIL/uL   Hemoglobin 11.0 (*) 12.0 - 15.0 g/dL   HCT 33.5 (*) 36.0 - 46.0 %   MCV 89.3  78.0 - 100.0 fL   MCH 29.3  26.0 - 34.0 pg   MCHC 32.8  30.0 - 36.0 g/dL   RDW 13.0  11.5 - 15.5 %   Platelets 338  150 - 400 K/uL  BASIC METABOLIC PANEL     Status: Abnormal   Collection Time    01/15/13  5:20 AM      Result Value Range   Sodium 140  135 - 145 mEq/L   Potassium 4.1  3.5 - 5.1 mEq/L   Chloride 106  96 - 112 mEq/L   CO2 25  19 - 32 mEq/L   Glucose, Bld 137 (*) 70 - 99 mg/dL   BUN 8  6 - 23 mg/dL     Creatinine, Ser 0.68  0.50 - 1.10 mg/dL   Calcium 9.1  8.4 - 10.5 mg/dL   GFR calc non Af Amer 88 (*) >90 mL/min   GFR calc Af Amer >90  >90 mL/min  GLUCOSE, CAPILLARY     Status: Abnormal   Collection Time    01/15/13  6:05 AM      Result Value Range   Glucose-Capillary 127 (*) 70 - 99 mg/dL   Comment 1 Notify RN      Intake/Output Summary (Last 24 hours) at 01/15/13 0706 Last data filed at 01/15/13 0145  Gross per   24 hour  Intake    360 ml  Output   1300 ml  Net   -940 ml     ASSESSMENT AND PLAN:  SVT (supraventricular tachycardia):  No recurrence of this.    Coronary atherosclerosis of native coronary artery:  NQWMI.  Plan PCI today.    Hematochezia:  No further evidence of bleeding.  Diverticulosis and ulceration of the rectum.  GI suggests that cardiology can proceed with therapy as needed.   [name] 01/15/2013 7:06 AM   

## 2013-01-15 NOTE — H&P (View-Only) (Signed)
SUBJECTIVE:  No chest pain.  No SOB   PHYSICAL EXAM Filed Vitals:   01/14/13 1100 01/14/13 1510 01/14/13 2055 01/15/13 0547  BP: 148/63  130/63 137/66  Pulse:   73 81  Temp:   99 F (37.2 C) 98.9 F (37.2 C)  TempSrc:   Oral Oral  Resp:   20 19  Height:      Weight:    189 lb 13.1 oz (86.1 kg)  SpO2:  94% 92% 94%   General:  No distress Lungs:  Clear Heart:  RRR Abdomen:  Positive bowel sounds, no rebound no guarding Extremities:  No edema  LABS: Lab Results  Component Value Date   CKTOTAL 40 01/06/2013   CKMB 9.0* 01/06/2013   TROPONINI 2.78* 01/06/2013   Results for orders placed during the hospital encounter of 01/04/13 (from the past 24 hour(s))  GLUCOSE, CAPILLARY     Status: None   Collection Time    01/14/13 11:36 AM      Result Value Range   Glucose-Capillary 88  70 - 99 mg/dL   Comment 1 Documented in Chart     Comment 2 Notify RN    GLUCOSE, CAPILLARY     Status: Abnormal   Collection Time    01/14/13  4:13 PM      Result Value Range   Glucose-Capillary 107 (*) 70 - 99 mg/dL   Comment 1 Notify RN     Comment 2 Documented in Chart    GLUCOSE, CAPILLARY     Status: Abnormal   Collection Time    01/14/13  8:53 PM      Result Value Range   Glucose-Capillary 165 (*) 70 - 99 mg/dL   Comment 1 Notify RN    CBC     Status: Abnormal   Collection Time    01/15/13  5:20 AM      Result Value Range   WBC 10.6 (*) 4.0 - 10.5 K/uL   RBC 3.75 (*) 3.87 - 5.11 MIL/uL   Hemoglobin 11.0 (*) 12.0 - 15.0 g/dL   HCT 16.1 (*) 09.6 - 04.5 %   MCV 89.3  78.0 - 100.0 fL   MCH 29.3  26.0 - 34.0 pg   MCHC 32.8  30.0 - 36.0 g/dL   RDW 40.9  81.1 - 91.4 %   Platelets 338  150 - 400 K/uL  BASIC METABOLIC PANEL     Status: Abnormal   Collection Time    01/15/13  5:20 AM      Result Value Range   Sodium 140  135 - 145 mEq/L   Potassium 4.1  3.5 - 5.1 mEq/L   Chloride 106  96 - 112 mEq/L   CO2 25  19 - 32 mEq/L   Glucose, Bld 137 (*) 70 - 99 mg/dL   BUN 8  6 - 23 mg/dL     Creatinine, Ser 7.82  0.50 - 1.10 mg/dL   Calcium 9.1  8.4 - 95.6 mg/dL   GFR calc non Af Amer 88 (*) >90 mL/min   GFR calc Af Amer >90  >90 mL/min  GLUCOSE, CAPILLARY     Status: Abnormal   Collection Time    01/15/13  6:05 AM      Result Value Range   Glucose-Capillary 127 (*) 70 - 99 mg/dL   Comment 1 Notify RN      Intake/Output Summary (Last 24 hours) at 01/15/13 0706 Last data filed at 01/15/13 0145  Gross per  24 hour  Intake    360 ml  Output   1300 ml  Net   -940 ml     ASSESSMENT AND PLAN:  SVT (supraventricular tachycardia):  No recurrence of this.    Coronary atherosclerosis of native coronary artery:  NQWMI.  Plan PCI today.    Hematochezia:  No further evidence of bleeding.  Diverticulosis and ulceration of the rectum.  GI suggests that cardiology can proceed with therapy as needed.   Fayrene Fearing [facility] 01/15/2013 7:06 AM

## 2013-01-15 NOTE — CV Procedure (Signed)
CARDIAC CATH NOTE  Name: [name] MRN: [id number] DOB: 1946/09/05  Procedure: PTCA and stenting of the RCA  Indication: 67 year old white female recently admitted with urosepsis and supraventricular tachycardia. She ruled in for a non-ST elevation myocardial infarction. Subsequent cardiac catheterization demonstrated 95% stenosis in the proximal right coronary which is a large dominant vessel. Patient underwent evaluation for rectal bleeding which demonstrated some rectal ulceration. She was cleared by GI for proceeding with PCI. She was loaded with aspirin and Plavix prior to procedure.  Procedural Details: Initially the right wrist was prepped and draped and anesthetized with 1% lidocaine. A 6 French sheath was introduced using the modified Seldinger technique into the right radial artery.Weight-based bivalirudin was given for anticoagulation. We tried multiple guide catheters including a FR4, right Amplatz 1, right Amplatz 2, and ERAD right guide with only marginal support with the the ERAD guide. We were able to cross the lesion with a pro-water wire and were able to cross the lesion with a 2.5 mm compliant balloon for predilatation.  Because of marked tortuosity of the right coronary we were only able to pass the wire into the mid vessel. After predilatation with a 2.5 mm balloon we were unable to cross lesion with a stent or a 3.0 mm balloon. There was significant angulation within the lesion. Given our inadequate support we decided to switch to a femoral approach.  The right groin was prepped, draped, and anesthetized with 1% lidocaine. Using the modified Seldinger technique, a 6 Fr sheath was introduced into the right femoral artery.   6 French  right Amplatz 2  guide catheter was inserted. From a femoral approach this gave adequate support.   A  pro-water coronary guidewire was used to cross the lesion.   we were able to pass this wire to the level of the crux. The lesion was predilated  with a  3.0 mm  balloon.   At this point we were still unable to pass a stent across the lesion. We cross the lesion with a whisper wire to be used as a buddy wire. We were actually able to get this wire into the far distal RCA for better support. We then predilated the lesion with a 3.5 mm balloon with excellent expansion. 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  The pro-water wire was removed prior to stent deployment and we used the whisper wire for all subsequent exchanges.At this point we were able to stent the most proximal portion of the dissection with a 3.0 x 28 mm Promus premier stent. This was deployed at 12 atmospheres. There was still evidence of dissection extending to the mid vessel. We post dilated the proximal stent with a 3.5 mm noncompliant balloon. We were unable to pass a balloon or a stent into the mid vessel. We then used a guideliner support catheter and were able to pass this through the proximal stent. We were then able to stent the mid RCA with a 3.0 x 20 mm Promus premier stent in an overlapping fashion.   The stent was postdilated with a  3.5 mm  noncompliant balloon in the overlap region.  Following PCI, there was 0% residual stenosis and TIMI-3 flow. Final angiography confirmed an excellent result. The patient tolerated the procedure well. There were no immediate procedural complications. Femoral hemostasis was achieved with  manual compression.  Radial artery hemostasis was achieved with a radial band. The  patient was transferred to the post catheterization recovery area for further monitoring.  Lesion Data: Vessel: RCA  Percent stenosis (pre):  95%  TIMI-flow (pre):  3  Stent:  3.0 x 20 mm and 3.0 x 28 mm Promus premier stents  Percent stenosis (post): 0%  TIMI-flow (post): 3   Conclusions:  Successful PCI of the proximal to mid RCA with a drug-eluting stents. Procedure  was complicated by difficult guide backup, extreme vessel tortuosity, and extensive dissection post PTCA. With stenting we were able to achieve an excellent angiographic result.   Recommendations:  Continue dual antiplatelet therapy optimally for one year.   Theron Arista [facility] 01/15/2013, 5:41 PM

## 2013-01-15 NOTE — Progress Notes (Signed)
[facility] PROGRESS NOTE  [name]:[id number] DOB: 1946/06/16 DOA: 01/04/2013 PCP: Johny Blamer, MD  Assessment/Plan: Principal Problem:   UTI (lower urinary tract infection) Active Problems:   Hyponatremia   Type II or unspecified type diabetes mellitus without mention of complication, not stated as uncontrolled   Hypokalemia   Fever   H/O vertebroplasty   SVT (supraventricular tachycardia)   Non-ST elevation myocardial infarction (NSTEMI)   Hematochezia   Coronary atherosclerosis of native coronary artery   Oral thrush    1. E coli UTI (lower urinary tract infection): Patient presented with a pyrexia of 103.1 and wcc of 13.8. Urinalysis revealed a positive urinary sediment, consistent with UTI. She was commenced on iv Rocephin, now day # 8. Urine culture positive for > 100K E.Coli pan-sensitive. Patient has no history of bladder prolapse, previous bladder surgeries or frequent urinary tract infections. Patient defervesced and as of 01/07/13, wcc had normalized, and patient feels better. Antibiotic discontinued on 01/11/13.  2. SVT (supraventricular tachycardia): Soon after admission, patient developed tachycardia, narrow complex SVT with rate of 200 bpm, elevated troponin 1.67, and mild ST depression in II and aVF. She had a subsequent spontaneous resolution of this with resolution of diffuse ST changes and no chest pain. Dr Rollene Rotunda provided cardiology consultation. The likely etiology is early sepsis and NSTEMI. Beta-blocker was commenced, with satisfactory effect. No recurrence.  3. NSTEMI/single vessel RCA stenosis:  As described above, patient's cardiac enzymes were found to be elevated at 1.67, following her episode of PSVT. 2 D ECHO realtively unremarkable but with grade I diastolic dysfunction. Cardiac cath 01/08/13 revealed severe RCA stenosis. Dr Rollene Rotunda provided cardiology consultation, an patient was subsequently seen by Dr Elease Hashimoto. PCI is planned,  but procedure was delayed, pending GI evaluation which was concluded on 01/14/13. PCI now scheduled for 01/15/13. 4. Rectal bleed:  Nursing and patient reports bright red blood with each stool. This is possibly related to hemorrhoidal bleed in setting of anticoagulation. Hb is stable at this time, but as stent placement is deemed high risk in this patient with ongoing GI blood loss, GI evaluation is warranted, and Dr Dorena Cookey, has been consulted and colonoscopy is planned. Not feasible on 01/12/13, due to poor bowel prep. Dr Herbert Moors performed a colonoscopy on 01/14/13, which revealed a linear ulceration and a couple of small ulcers in the distal rectum. Biopsy was obtained from the ulcerated area. This appeared completely benign and could be a stercoral ulcer or from ischemia. Patient cleared from GI stand point, for PCI.  5. Hypotension/Hyponatremia/Hypokalemia: This was transient, and attributable to volume depletion and dehydration. Diuretics were held, iv fluids utilized, with resolution. Potassium repleted as indicated. Anti-hypertensives cautiously restarted. Added Amlodipine on 01/11/13 and BPis reasonably controlled.  6. AKI: Patient has a bump in creatinine to 1.25 on 01/06/13 (Creatinine on admission was 0.76), most likely secondary to pre-renal azotemia. Now resolved.  7. Type II or unspecified type diabetes mellitus: Controlled on SSI/Lantus. HBA1C is 6.1.  8. Anemia of chronic disease: This is mild, normocytic. HB is stable and at patient's baseline. Following CBC.   9. Diarrhea: Transient, self-limited. Fortunately, stool C. Difficile PCR was negative.  9. Oral thrush: This was an incidental finding on physical examination on 01/11/13. For a 7-day course of Diflucan. Now day #5.    Code Status: Full Code.  Family Communication:  Disposition Plan: To be determined. Per cariology. Possible discharge on 01/16/13.    Brief narrative: 67 y.o. female  with history of diabetes mellitus and  hypertension, status post recent kyphoplasty (12/27/2012), presenting to [facility] ED 01/04/2013, with main concern of difficulty with ambulation at home, having sustained fall after trying to get up from the bed the afternoon just prior to admission. She called 911 and upon their arrival, they checked her temperature which was 103.1 F. In ED, UA consistent with UTI and TRH asked to admit for further evaluation. Upon arrival to the floor, pt developed tachycardia, narrow complex SVT with rate of 200 bpm, elevated troponin 1.67, and mild ST depression in II and aVF. Cardiology consulted.    Consultants:  Dr Rollene Rotunda, cardiologist.   Dr Dorena Cookey, GI.   Procedures:  CXR.  X-Ray right shoulder.   Antibiotics:  Rocephin 01/04/13-01/11/13.   HPI/Subjective: Asymptomatic.   Objective: Vital signs in last 24 hours: Temp:  [98.9 F (37.2 C)-99 F (37.2 C)] 98.9 F (37.2 C) (02/18 0547) Pulse Rate:  [73-81] 81 (02/18 0547) Resp:  [19-20] 19 (02/18 0547) BP: (130-148)/(63-66) 137/66 mmHg (02/18 0547) SpO2:  [92 %-94 %] 94 % (02/18 0547) Weight:  [86.1 kg (189 lb 13.1 oz)] 86.1 kg (189 lb 13.1 oz) (02/18 0547) Weight change: 0 kg (0 lb) Last BM Date: 01/15/13  Intake/Output from previous day: 02/17 0701 - 02/18 0700 In: 360 [P.O.:360] Out: 1300 [Urine:1300] Total I/O In: 0  Out: 350 [Urine:350]   Physical Exam: General: Comfortable, alert, communicative, fully oriented, not short of breath at rest.  HEENT:  Mild clinical pallor, no jaundice, no conjunctival injection or discharge. Hydration is fair. Throat is clear.   NECK:  Supple, JVP not seen, no carotid bruits, no palpable lymphadenopathy, no palpable goiter. CHEST:  Clinically clear to auscultation, no wheezes, no crackles. HEART:  Sounds 1 and 2 heard, normal, regular, no murmurs. ABDOMEN:  Full, soft, non-tender, no palpable organomegaly, no palpable masses, normal bowel sounds. GENITALIA:  Not examined. LOWER EXTREMITIES:   Mild pitting edema, palpable peripheral pulses. MUSCULOSKELETAL SYSTEM:  Generalized osteoarthritic changes, otherwise, normal. CENTRAL NERVOUS SYSTEM:  No focal neurologic deficit on gross examination.  Lab Results:  Recent Labs  01/14/13 0555 01/15/13 0520  WBC 8.5 10.6*  HGB 10.9* 11.0*  HCT 33.9* 33.5*  PLT 306 338    Recent Labs  01/14/13 0555 01/15/13 0520  NA 139 140  K 3.4* 4.1  CL 102 106  CO2 27 25  GLUCOSE 93 137*  BUN 5* 8  CREATININE 0.70 0.68  CALCIUM 8.9 9.1   Recent Results (from the past 240 hour(s))  CLOSTRIDIUM DIFFICILE BY PCR     Status: None   Collection Time    01/09/13  5:51 AM      Result Value Range Status   C difficile by pcr NEGATIVE  NEGATIVE Final     Studies/Results: No results found.  Medications: Scheduled Meds: . amLODipine  5 mg Oral Daily  . aspirin  325 mg Oral Daily  . clopidogrel  75 mg Oral Q breakfast  . feeding supplement  237 mL Oral BID BM  . fluconazole  100 mg Oral Daily  . insulin aspart  0-9 Units Subcutaneous TID WC  . insulin glargine  10 Units Subcutaneous BID  . metoprolol tartrate  50 mg Oral BID  . multivitamin with minerals  1 tablet Oral q morning - 10a  . polyethylene glycol  17 g Oral Daily  . simvastatin  40 mg Oral QHS  . sodium chloride  3 mL Intravenous Q12H  .  sodium chloride  3 mL Intravenous Q12H   Continuous Infusions: . sodium chloride 500 mL (01/12/13 1007)  . sodium chloride 20 mL/hr at 01/13/13 1008  . sodium chloride     PRN Meds:.sodium chloride, acetaminophen, ALPRAZolam, morphine injection, ondansetron (ZOFRAN) IV, ondansetron, oxyCODONE, sodium chloride    LOS: 11 days   Mariyana Fulop,CHRISTOPHER  [facility] Pager [phone]. If 8PM-8AM, please contact night-coverage at [email or web address], password [facility] 01/15/2013, 10:47 AM  LOS: 11 days

## 2013-01-15 NOTE — Interval H&P Note (Signed)
History and Physical Interval Note:  01/15/2013 2:08 PM  [name]  has presented today for surgery, with the diagnosis of CAD  The various methods of treatment have been discussed with the patient and family. After consideration of risks, benefits and other options for treatment, the patient has consented to  Procedure(s): PERCUTANEOUS STENT INTERVENTION (N/A) as a surgical intervention .  The patient's history has been reviewed, patient examined, no change in status, stable for surgery.  I have reviewed the patient's chart and labs.  Questions were answered to the patient's satisfaction.     Theron Arista [facility] 01/15/2013 2:08 PM

## 2013-01-16 LAB — BASIC METABOLIC PANEL
BUN: 6 mg/dL (ref 6–23)
CO2: 28 mEq/L (ref 19–32)
Calcium: 8.9 mg/dL (ref 8.4–10.5)
Chloride: 104 mEq/L (ref 96–112)
Creatinine, Ser: 0.71 mg/dL (ref 0.50–1.10)
GFR calc Af Amer: >90 mL/min (ref >90)

## 2013-01-16 LAB — GLUCOSE, CAPILLARY
Glucose-Capillary: 170 mg/dL — ABNORMAL HIGH (ref 70–99)
Glucose-Capillary: 156 mg/dL — ABNORMAL HIGH (ref 70–99)

## 2013-01-16 LAB — CBC
HCT: 30.9 % — ABNORMAL LOW (ref 36.0–46.0)
MCH: 29.3 pg (ref 26.0–34.0)
MCV: 89.6 fL (ref 78.0–100.0)
Platelets: 326 10*3/uL (ref 150–400)
Platelets: 339 10*3/uL (ref 150–400)
RBC: 3.53 MIL/uL — ABNORMAL LOW (ref 3.87–5.11)
RDW: 13 % (ref 11.5–15.5)
WBC: 13 10*3/uL — ABNORMAL HIGH (ref 4.0–10.5)

## 2013-01-16 MED ORDER — PROMETHAZINE HCL 25 MG/ML IJ SOLN
12.5000 mg | Freq: Four times a day (QID) | INTRAMUSCULAR | Status: DC | PRN
  Filled 2013-01-16: qty 1

## 2013-01-16 MED FILL — Dextrose Inj 5%: INTRAVENOUS | Qty: 50 | Status: AC

## 2013-01-16 NOTE — Progress Notes (Signed)
[facility] Progress Note [place] TEAM 1 - Stepdown/ICU TEAM   [name] ZOX:[id number] DOB: 1946/05/23 DOA: 01/04/2013 PCP: Johny Blamer, MD  Brief narrative: 67 y.o. female with history of diabetes mellitus and hypertension, status post recent kyphoplasty (12/27/2012). She presented to [facility] ED 01/04/2013 with main concern of difficulty with ambulation at home having sustained fall after trying to get up from the bed the afternoon just prior to admission. She called 911 and upon their arrival, pt reported they checked her temperature which was 103.1 F. In ED, UA consistent with UTI and TRH asked to admit for further evaluation. Upon arrival to the floor, pt developed tachycardia, narrow complex SVT with rate of 200 bpm, elevated troponin 1.67, and mild ST depression in II and aVF. Cardiology consulted.   Pt was transferred to [facility] on 2/11 to undergo a cardiac cath.  Said cath revealed a signif RCA stenosis.  The plan was to work to achieve greater medical stability, and then to intervene on her RCA stenosis.  She has completed a round of Rocephin on 01/11/13 to treat her UTI. Her electrolyte imbalances have corrected with restoration of volume and repletion of said lytes.  She has undergone a GI evaluation with colonoscopy which revealed several rectal ulcers. GI has approved use of anti-coagulation.   On 01/15/13 she underwent PCI x 2 with deployment of DES and was started on ASA and Plavix. She did have some post procedure bleeding into cath site without development of hematoma.  She appears quite deconditioned so PT/OT evaluations are pending. She may require CIR vs SNF before returning home.  Assessment/Plan:  E coli UTI (lower urinary tract infection)  - Urine culture positive for > 100K E.Coli pan sensitive  - completed a course of Rocephin 01/11/13  SVT (supraventricular tachycardia)  - rate controlled w/B blocker - suspect this was due to early sepsis + severe DH  -  Cards following  NSTEMI - single vessel RCA stenosis - 2 D ECHO realtively unremarkable but with grade I diastolic dysfunction  - post PCI w/ 2 DES (01/15/13)-continue dual anti-platelet rxn w/ ASA and Plavix x 12 momths  Rectal bleed  - GI/[facility]:post colonoscopy (01/14/13) demonstrated several small distal rectal ulcers (stercoral vs ischemic)- prevent constipation- currently reporting diarrhea  Hyponatremia  - resolved - likely due to volume depletion + ongoing lasix use   Hypotension  - improved w/ volume and holding of diuretics -has tolerated resumption of BP meds   Type II or unspecified type diabetes mellitus  - A1C stable, 6.1 - continue Lantus and SSI  - CBG well controlled   Hypokalemia  - secondary to Lasix  - supplemented and within normal limits    Acute renal failure  - most likely secondary to pre-renal azotemia (DH, infection, lasix)  - creatinine is within normal limits at this time   Anemia of chronic disease  - Hg and Hct stable and at pt's baseline   Diarrhea  - continues-not on laxatives - C diff negative initially- if loose stools persist may need to repeat PCR  Code Status: FULL Family Communication: spoke directly w/ pt Disposition Plan: Transfer to Telemetry- PT/OT eval pending- may need rehab  Consultants: Cardiology - [facility]  Procedures: 01/08/2013 - Cardiac catheterization Conclusions:  1. 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 Mild LV dysfunction secondary to her recent Inferior wall NSTEMI.  01/12/13- Colonoscopy Inadequate prep-diverticulosis, melanosis coli  01/14/13- Colonoscopy ENDOSCOPIC IMPRESSION:  #1. Diffuse melanosis coli  #  2. Diverticulosis  #3. Ulceration in the rectum  RECOMMENDATIONS:  follow clinically. Check biopsies. Proceed with further cardiac  evaluation. I see no reason based on the colonoscopy findings that  she could not be anticoagulated per cardiology if necessary.    Antibiotics: Rocephin 02/08 >>> 2/14  DVT prophylaxis: SCDs - anticoag at discretion of Cardiology  HPI/Subjective: She states she feels better and wants to go home. Has not been up mobilizing much. Also has not been on solid diet. No CP,SOB or abdominal pain. Apparently loose stools persist.  Objective: Blood pressure 111/44, pulse 88, temperature 98.6 F (37 C), temperature source Oral, resp. rate 21, height 5\' 8"  (1.727 m), weight 84.142 kg (185 lb 8 oz), SpO2 93.00%.  Intake/Output Summary (Last 24 hours) at 01/16/13 1230 Last data filed at 01/16/13 0900  Gross per 24 hour  Intake  943.5 ml  Output    850 ml  Net   93.5 ml    Exam: General: No acute respiratory distress Lungs: Clear to auscultation bilaterally without wheezes or crackles, [place] oxygen Cardiovascular: Regular rate and rhythm without murmur gallop or rub normal  Abdomen: Nontender, nondistended, soft, bowel sounds positive, no rebound, no ascites, no appreciable mass Extremities: No significant cyanosis, clubbing, or edema bilateral lower extremities; bruising right groin post cath  Data Reviewed: Basic Metabolic Panel:  Recent Labs Lab 01/12/13 0500 01/13/13 0518 01/14/13 0555 01/15/13 0520 01/16/13 0415  NA 143 140 139 140 139  K 3.7 3.5 3.4* 4.1 4.0  CL 105 103 102 106 104  CO2 30 27 27 25 28   GLUCOSE 98 97 93 137* 109*  BUN 9 7 5* 8 6  CREATININE 0.72 0.69 0.70 0.68 0.71  CALCIUM 8.6 8.8 8.9 9.1 8.9   CBC:  Recent Labs Lab 01/13/13 0518 01/14/13 0555 01/15/13 0520 01/16/13 0016 01/16/13 0415  WBC 8.4 8.5 10.6* 13.0* 10.7*  HGB 9.9* 10.9* 11.0* 10.5* 10.1*  HCT 30.2* 33.9* 33.5* 31.7* 30.9*  MCV 89.3 89.7 89.3 89.8 89.6  PLT 291 306 338 326 339   CBG:  Recent Labs Lab 01/15/13 1109 01/15/13 1403 01/15/13 1754 01/15/13 2133 01/16/13 0748  GLUCAP 81 87 82 111* 88    Recent Results (from the past 240 hour(s))  CLOSTRIDIUM DIFFICILE BY PCR     Status: None   Collection  Time    01/09/13  5:51 AM      Result Value Range Status   C difficile by pcr NEGATIVE  NEGATIVE Final  MRSA PCR SCREENING     Status: None   Collection Time    01/15/13  6:55 PM      Result Value Range Status   MRSA by PCR NEGATIVE  NEGATIVE Final   Comment:            The GeneXpert MRSA Assay (FDA     approved for NASAL specimens     only), is one component of a     comprehensive MRSA colonization     surveillance program. It is not     intended to diagnose MRSA     infection nor to guide or     monitor treatment for     MRSA infections.     Studies:  Recent x-ray studies have been reviewed in detail by the Attending Physician  Scheduled Meds:  Reviewed in detail by the Attending Physician   Junious Silk, ANP [facility] Office  [phone] Pager [phone]  On-Call/Text Page:      [email or web address]  password [email or web address]  If 7PM-7AM, please contact night-coverage [email or web address] Password [email or web address] 01/16/2013, 12:30 PM   LOS: 12 days   I have personally examined this patient and reviewed the entire database. I have reviewed the above note, made any necessary editorial changes, and agree with its content.  Lonia Blood, MD [facility]

## 2013-01-16 NOTE — Clinical Social Work Psychosocial (Addendum)
    Clinical Social Work Department BRIEF PSYCHOSOCIAL ASSESSMENT 01/16/2013  Patient:  [name], [name]     Account Number:  [id number]     Admit date:  01/04/2013  Clinical Social Worker:  Hulan Fray  Date/Time:  01/16/2013 02:00 PM  Referred by:  Care Management  Date Referred:  01/16/2013 Referred for  SNF Placement   Other Referral:   Interview type:  Patient Other interview type:   Charlynne Cousins    PSYCHOSOCIAL DATA Living Status:  HUSBAND Admitted from facility:   Level of care:   Primary support name:  Morrison Old Primary support relationship to patient:  SPOUSE Degree of support available:   supportive    CURRENT CONCERNS Current Concerns  Post-Acute Placement   Other Concerns:    SOCIAL WORK ASSESSMENT / PLAN Clinical Social Worker received referral from [place], in CM regarding PT's change in recommendation for SNF placment. CSW introduced self and explained reason for visit. Patient's husband was a bedside.     CSW explained process of snf placement and answered patient's questions to best ability. Patient was agreeable for short term SNF placement and preferred [facility] if they have availability, as this facility is close to her house. Patient and husband were agreeable for CSW to initiate snf search in [facility]. Patient stated that she was not interested in any [facility] facilities. CSW will complete FL2 for MD's signature and continue to follow. CSW will update patient and husband when bed offers are made.     Assessment/plan status:  Psychosocial Support/Ongoing Assessment of Needs Other assessment/ plan:   Information/referral to community resources:   SNF packet    PATIENT'S/FAMILY'S RESPONSE TO PLAN OF CARE: Patient and husband are agreeable for short term SNF placement. Patient and husband appreciated CSW's visit.

## 2013-01-16 NOTE — Progress Notes (Signed)
TELEMETRY: Reviewed telemetry pt in NSR: Filed Vitals:   01/16/13 0500 01/16/13 0600 01/16/13 0700 01/16/13 0745  BP: 119/44 127/48 127/52   Pulse: 63 64  67  Temp:    98.2 F (36.8 C)  TempSrc:    Oral  Resp: 16 16  21   Height:      Weight: 185 lb 8 oz (84.142 kg)     SpO2: 99% 100%  100%    Intake/Output Summary (Last 24 hours) at 01/16/13 1119 Last data filed at 01/16/13 0900  Gross per 24 hour  Intake  943.5 ml  Output    850 ml  Net   93.5 ml    SUBJECTIVE Feels well. Denies chest pain or SOB. Still having liquid stools. Wants solid food to eat. Mild soreness at groin site.  LABS: Basic Metabolic Panel:  Recent Labs  16/10/96 0520 01/16/13 0415  NA 140 139  K 4.1 4.0  CL 106 104  CO2 25 28  GLUCOSE 137* 109*  BUN 8 6  CREATININE 0.68 0.71  CALCIUM 9.1 8.9   CBC:  Recent Labs  01/16/13 0016 01/16/13 0415  WBC 13.0* 10.7*  HGB 10.5* 10.1*  HCT 31.7* 30.9*  MCV 89.8 89.6  PLT 326 339    Radiology/Studies:  Dg Chest 2 View  01/04/2013  *RADIOLOGY REPORT*  Clinical Data: Nausea, fever, diarrhea  CHEST - 2 VIEW  Comparison: Chest radiograph 12/10/2012  Findings: Normal cardiac silhouette.  The ascending aorta is prominent but unchanged.  There is mild scarring at the lung bases also unchanged.  Lateral projection demonstrates compression fractures in the mid thoracic spine.  Interval vertebral plasty. No acute osseous abnormality.  IMPRESSION:  1.  No acute cardiopulmonary findings. 2. Chronic bibasilar scarring. 3. Prominent ascending aorta.  Query aortic stenosis.   Original Report Authenticated By: Genevive Bi, M.D.    ECG: NSR, normal.  PHYSICAL EXAM General: Well developed, elderly, in no acute distress. Head: Normocephalic, atraumatic, sclera non-icteric, no xanthomas, nares are without discharge. Neck: Negative for carotid bruits. JVD not elevated. Lungs: Clear bilaterally to auscultation without wheezes, rales, or rhonchi. Breathing is  unlabored. Heart: RRR S1 S2 without murmurs, rubs, or gallops.  Abdomen: Soft, non-tender, non-distended with normoactive bowel sounds. No hepatomegaly. No rebound/guarding. No obvious abdominal masses. Extremities: No clubbing, cyanosis or edema.  Distal pedal pulses are 2+ and equal bilaterally. Right radial artery without hematoma. Good pulse. Right femoral area with tenderness to palpation but no significant hematoma. Neuro: Alert and oriented X 3. Moves all extremities spontaneously. Psych:  Responds to questions appropriately with a normal affect.  ASSESSMENT AND PLAN: 1. NSTEMI. S/p PCI of RCA yesterday complicated by dissection. Successful PCI with 2 DES. Ecg now normal. No clinical symptoms of angina. Continue dual antiplatelet Rx with ASA and Plavix for one year if possible. 2. SVT resolved. Continue metoprolol. 3. Hematochezia. Rectal ulceration. No recurrent bleeding. 4. Anemia. Hgb looks surprisingly good considering blood loss with complicated PCI yesterday. 5. UTI 6. DM 7. Deconditioning. PT notes patient still unstable ambulating. Will need further PT and OT before DC home.   Principal Problem:   UTI (lower urinary tract infection) Active Problems:   Hyponatremia   Type II or unspecified type diabetes mellitus without mention of complication, not stated as uncontrolled   Hypokalemia   Fever   H/O vertebroplasty   SVT (supraventricular tachycardia)   Non-ST elevation myocardial infarction (NSTEMI)   Hematochezia   Coronary atherosclerosis of native coronary artery  Oral thrush    Signed, Jomaira Darr [place] MD,FACC 01/16/2013 11:26 AM

## 2013-01-16 NOTE — Clinical Social Work Placement (Signed)
     Clinical Social Work Department CLINICAL SOCIAL WORK PLACEMENT NOTE 01/16/2013  Patient:  [name], [name]  Account Number:  [id number] Admit date:  01/04/2013  Clinical Social Worker:  Hulan Fray  Date/time:  01/16/2013 02:30 PM  Clinical Social Work is seeking post-discharge placement for this patient at the following level of care:   SKILLED NURSING   (*CSW will update this form in Epic as items are completed)   01/16/2013  Patient/family provided with [facility] Department of Clinical Social Works list of facilities offering this level of care within the geographic area requested by the patient (or if unable, by the patients family).  01/16/2013  Patient/family informed of their freedom to choose among providers that offer the needed level of care, that participate in Medicare, Medicaid or managed care program needed by the patient, have an available bed and are willing to accept the patient.  01/16/2013  Patient/family informed of [facility] ownership interest in [facility], as well as of the fact that they are under no obligation to receive care at this facility.  PASARR submitted to EDS on 01/16/2013 PASARR number received from EDS on   FL2 transmitted to all facilities in geographic area requested by pt/family on  01/16/2013 FL2 transmitted to all facilities within larger geographic area on   Patient informed that his/her managed care company has contracts with or will negotiate with  certain facilities, including the following:     Patient/family informed of bed offers received:   Patient chooses bed at  Physician recommends and patient chooses bed at    Patient to be transferred to  on   Patient to be transferred to facility by   The following physician request were entered in Epic:   Additional Comments:

## 2013-01-16 NOTE — Progress Notes (Signed)
Physical Therapy Treatment Patient Details Name: [name] MRN: [id number] DOB: 1945-12-13 Today's Date: 01/16/2013 Time: 1478-2956 PT Time Calculation (min): 32 min  PT Assessment / Plan / Recommendation Comments on Treatment Session  Adm. UTI and NSTEMI with recent kyphoplasty on 1/30 at [facility] with a fall. Safety awareness continues to be poor today. Attempted to educate patient and husband on how to improve safety at home including involving husband in transfers and assistance during gait but patient's husband very hands off and looks very ill prepared to take his wife home at this point. She is at hight risk of falls with poor safety awareness, weakness and balance deficits. Recommending continued rehab prior to d/c home at this point.     Follow Up Recommendations  SNF;Supervision/Assistance - 24 hour     Does the patient have the potential to tolerate intense rehabilitation     Barriers to Discharge        Equipment Recommendations  Rolling walker with 5" wheels    Recommendations for Other Services    Frequency Min 3X/week   Plan Discharge plan needs to be updated;Frequency remains appropriate    Precautions / Restrictions Precautions Precautions: Fall Precaution Comments: impulsive with decreased safety awareness Restrictions Weight Bearing Restrictions: No   Pertinent Vitals/Pain Denies pain, patient on RA during our session and SaO2 maintained at or above 90%    Mobility  Bed Mobility Bed Mobility: Supine to Sit Supine to Sit: 6: Modified independent (Device/Increase time) Sitting - Scoot to Edge of Bed: 6: Modified independent (Device/Increase time) Transfers Transfers: Sit to Stand;Stand to Sit Sit to Stand: 4: Min assist;1: +2 Total assist Sit to Stand: Patient Percentage: 70% Stand to Sit: 4: Min assist Stand Pivot Transfers: 4: Min assist (bed->3in1) Details for Transfer Assistance: At the beginning of our session pt refusing help needing minA from bed  and 3in1 for stability and safe sequencing cues (hand placement, placing RW in front of her); then after ambulation (approx 10 ft) and speaking with MD (pt was told she would not be going home today) pt became anxious and upset about not being able to go home and wouldn't even attempt standing from the chair without +2totalpt70% because she was "too weak" Ambulation/Gait Ambulation/Gait Assistance: 4: Min assist Ambulation Distance (Feet): 10 Feet Assistive device: Rolling walker Ambulation/Gait Assistance Details: constant cueing mod-max verbal cues for safety for RW with minA stability assist; pt impulsively pushing RW too far in front of her with gaze at the floor not attending to cords and lines getting tangled around her Gait Pattern: Trunk flexed Gait velocity: impulsive      PT Goals Acute Rehab PT Goals PT Goal: Supine/Side to Sit - Progress: Met PT Goal: Sit to Stand - Progress: Progressing toward goal PT Goal: Stand to Sit - Progress: Progressing toward goal PT Goal: Ambulate - Progress: Progressing toward goal Pt will Go Up / Down Stairs: 3-5 stairs;with rail(s);with min assist PT Goal: Up/Down Stairs - Progress: Goal set today Additional Goals Additional Goal #1: Caregiver will demonstrate appropriate assist level for safe transfers and gait independently.  PT Goal: Additional Goal #1 - Progress: Goal set today  Visit Information  Last PT Received On: 01/16/13 Assistance Needed: +2 (for safety, chair follow, pt unpredictable)    Subjective Data  Subjective: I just want to go home. You know I can't stand with just one person I will fall! Oh Daddy God please help me.  Patient Stated Goal: home (pt thought she would  go home today)   Cognition  Cognition Overall Cognitive Status: Impaired Area of Impairment: Attention Arousal/Alertness: Awake/alert Orientation Level: Appears intact for tasks assessed Behavior During Session: Anxious Current Attention Level:  Selective;Sustained Following Commands: Follows one step commands inconsistently;Follows multi-step commands inconsistently Safety/Judgement: Impulsive;Decreased awareness of need for assistance Cognition - Other Comments: poor safety awareness especially as she becomes anxious her attention decreases to sustained; husband demonstrating poor awareness of safety as well; unsafe home situation    Balance     End of Session PT - End of Session Equipment Utilized During Treatment: Gait belt Activity Tolerance: Patient limited by fatigue Patient left: in chair;with call bell/phone within reach;with family/visitor present Nurse Communication: Mobility status;Other (comment) (d/c recommendation changes)   GP     WHITLOW,Kimyata Milich HELEN 01/16/2013, 1:59 PM

## 2013-01-16 NOTE — Progress Notes (Signed)
Abbreviated Physical Therapy Note  PT treatment session completed this morning with patient and her husband. Full note to follow. Patient is demonstrating very unsafe gait with poor safety awareness. Husband does not appear prepared to take her home at this time. She is at very high risk for falls. PT recommendation has been changed to SNF. Case manager made aware.  Ivonne Andrew PT, DPT Pager: [phone]

## 2013-01-16 NOTE — Progress Notes (Signed)
Occupational Therapy Treatment Patient Details Name: [name] MRN: [id number] DOB: 1946/11/17 Today's Date: 01/16/2013 Time: 4098-1191 OT Time Calculation (min): 34 min  OT Assessment / Plan / Recommendation Comments on Treatment Session Pt demonstrating decreased safety awareness and thus has high potential for fall risk if to return home.  Discussed with pt and husband option of rehab at SNF before returning home, and pt is willing to consider SNF before eventually returning home.      Follow Up Recommendations  SNF    Barriers to Discharge       Equipment Recommendations  3 in 1 bedside comode    Recommendations for Other Services    Frequency Min 2X/week   Plan Discharge plan needs to be updated    Precautions / Restrictions Precautions Precautions: Fall Precaution Comments: impulsive with decreased safety awareness Restrictions Weight Bearing Restrictions: No   Pertinent Vitals/Pain See vitals    ADL  Grooming: Performed;Wash/dry hands;Set up Where Assessed - Grooming: Supported sitting Upper Body Dressing: Performed;Set up Where Assessed - Upper Body Dressing: Unsupported sitting Lower Body Dressing: Performed;Moderate assistance (donning and fastening shoes) Where Assessed - Lower Body Dressing: Unsupported sitting Toilet Transfer: Performed;Minimal assistance Toilet Transfer Method: Stand pivot Toilet Transfer Equipment: Bedside commode Toileting - Clothing Manipulation and Hygiene: Performed;Min guard Where Assessed - Engineer, mining and Hygiene: Standing Equipment Used: Gait belt;Rolling walker Transfers/Ambulation Related to ADLs: Pt performed SPT from bed to 3n1 with min assist and mod verbal cueing for safety awareness during transfer.  Pt is very impulsive and attempts to do things her own way. ADL Comments: Pt progressively becoming more anxious during session. At start of session, pt very hopeful to be going home today and requesting  PT/OT  "to not touch her" duing transfers because "I''m going home today."  After completing toileting tasks, cardiologist arrived and informed pt that she is likely not going home today (which OT/PT agreeable to due to pt decreased safety).  After being informed that she would likely not be returning home today, pt became very anxious and would not attempt to stand until she felt two people assisting her.  Pt became very self limiting at end of session and became anxious with basic sit<>stand (although she had already perform toilet transfer and ambulated through room).  Pt's huband present during session and not willing to participate in learning how to assist pt during mobility despite encouragement from therapsit.    OT Diagnosis:    OT Problem List:   OT Treatment Interventions:     OT Goals ADL Goals Pt Will Perform Grooming: with supervision;Supported;Other (comment) ADL Goal: Grooming - Progress: Progressing toward goals Pt Will Perform Lower Body Dressing: with supervision;Sit to stand from bed ADL Goal: Lower Body Dressing - Progress: Progressing toward goals Pt Will Transfer to Toilet: with supervision;3-in-1;with DME ADL Goal: Toilet Transfer - Progress: Progressing toward goals Pt Will Perform Toileting - Clothing Manipulation: with supervision;Sitting on 3-in-1 or toilet;Standing ADL Goal: Toileting - Clothing Manipulation - Progress: Progressing toward goals Pt Will Perform Toileting - Hygiene: with supervision;Sit to stand from 3-in-1/toilet ADL Goal: Toileting - Hygiene - Progress: Progressing toward goals  Visit Information  Last OT Received On: 01/16/13 Assistance Needed: +2 (for safety, chair follow, pt unpredictable) PT/OT Co-Evaluation/Treatment: Yes    Subjective Data      Prior Functioning       Cognition  Cognition Overall Cognitive Status: Impaired Area of Impairment: Attention Arousal/Alertness: Awake/alert Orientation Level: Appears intact for tasks  assessed Behavior During Session: Anxious Current Attention Level: Selective;Sustained Following Commands: Follows one step commands inconsistently;Follows multi-step commands inconsistently Safety/Judgement: Impulsive;Decreased awareness of need for assistance Cognition - Other Comments: poor safety awareness especially as she becomes anxious her attention decreases to sustained; husband demonstrating poor awareness of safety as well; unsafe home situation    Mobility  Bed Mobility Bed Mobility: Supine to Sit Supine to Sit: 6: Modified independent (Device/Increase time) Sitting - Scoot to Edge of Bed: 6: Modified independent (Device/Increase time) Transfers Transfers: Stand to Sit;Sit to Stand Sit to Stand: 4: Min assist;1: +2 Total assist Sit to Stand: Patient Percentage: 70% Stand to Sit: 4: Min assist Details for Transfer Assistance: At the beginning of our session pt refusing help needing minA from bed and 3in1 for stability and safe sequencing cues (hand placement, placing RW in front of her); then after ambulation (approx 10 ft) and speaking with MD pt became anxious and upset about not being able to go home and wouldn't even attempt standing from the chair without +2totalpt70% because she was "too weak"    Exercises      Balance     End of Session OT - End of Session Equipment Utilized During Treatment: Gait belt Activity Tolerance: Patient limited by fatigue (limited by anxiety) Patient left: in chair;with call bell/phone within reach;with family/visitor present Nurse Communication: Mobility status (PT/OT now recommending SNF)  GO    01/16/2013 Cipriano Mile OTR/L Pager [phone] Office [phone]  Cipriano Mile 01/16/2013, 2:07 PM

## 2013-01-16 NOTE — Plan of Care (Signed)
Cross cover note   Ms. Weitman is s/p cath today with PCI to RCA.  Had femoral sheath pulled in CCU post procedure with bleeding.  Pressure was held for 20 minutes and hemostasis was achieved.  There were 2+ femoral and distal pulses.  There was no significant hematoma noted.  Patient received 500cc bolus of IVFs.  Hemodynamically stable and comfortably resting in bed.  Plan to check CBC. Otherwise continue post sheath/cath orders.

## 2013-01-16 NOTE — Progress Notes (Signed)
Femoral sheath removed from pt's right groin 2/18 at 2245. Pt remained asymptomatic except for c/o minor pain r/t pressure at right groin site. VS were stable throughout procedure. A moderate-large amount of bleeding occurred from site despite very firm pressure. Dr. Terressa Koyanagi on-call for [facility] cardiology was notified and came to pt's bedside. 500 mL NS bolus ordered and administed to pt. Lowest BP reading was 97/59, HR 81.  Firm pressure held for 20 minutes and moderate pressure held for another 15-20 minutes to ensure hemostasis. Hemostasis achieved at approximately 2320 and sterile dressing applied to site. Pt's femoral and pedal pulses remained 2+ and distal extremities warm to the touch. Educated pt about keeping legs straight and lying flat for four hours, as well as applying pressure to site when coughing, laughing, sneezing, etc. Will continue to monitor.

## 2013-01-16 NOTE — Progress Notes (Addendum)
Transferred to 3021 by wheelchair, stable. Belongings with pt.report given to Rn.

## 2013-01-17 LAB — GLUCOSE, CAPILLARY: Glucose-Capillary: 113 mg/dL — ABNORMAL HIGH (ref 70–99)

## 2013-01-17 LAB — CBC
Hemoglobin: 9.5 g/dL — ABNORMAL LOW (ref 12.0–15.0)
MCH: 29.3 pg (ref 26.0–34.0)
RBC: 3.24 MIL/uL — ABNORMAL LOW (ref 3.87–5.11)

## 2013-01-17 LAB — BASIC METABOLIC PANEL
CO2: 29 mEq/L (ref 19–32)
Glucose, Bld: 145 mg/dL — ABNORMAL HIGH (ref 70–99)
Potassium: 5 mEq/L (ref 3.5–5.1)
Sodium: 139 mEq/L (ref 135–145)

## 2013-01-17 MED ORDER — GLUCERNA SHAKE PO LIQD: 237.0000 mL | Freq: Two times a day (BID) | ORAL | Status: DC

## 2013-01-17 MED ORDER — CLOPIDOGREL BISULFATE 75 MG PO TABS: 75.0000 mg | ORAL_TABLET | Freq: Every day | ORAL | Status: DC

## 2013-01-17 MED ORDER — ASPIRIN 325 MG PO TABS: 325.0000 mg | ORAL_TABLET | Freq: Every day | ORAL | Status: DC

## 2013-01-17 MED ORDER — METOPROLOL TARTRATE 50 MG PO TABS: 50.0000 mg | ORAL_TABLET | Freq: Two times a day (BID) | ORAL | Status: DC

## 2013-01-17 NOTE — Progress Notes (Signed)
TELEMETRY: Reviewed telemetry pt in NSR: Filed Vitals:   01/16/13 1611 01/16/13 2100 01/16/13 2127 01/17/13 0510  BP: 138/70 151/77 151/77 137/83  Pulse: 79 86 86 94  Temp: 98.4 F (36.9 C) 98.2 F (36.8 C)  98.3 F (36.8 C)  TempSrc: Oral   Oral  Resp: 20 18  18   Height:      Weight:    184 lb 3.2 oz (83.553 kg)  SpO2: 98% 95%  96%    Intake/Output Summary (Last 24 hours) at 01/17/13 0849 Last data filed at 01/16/13 2128  Gross per 24 hour  Intake    540 ml  Output   1200 ml  Net   -660 ml    SUBJECTIVE Feels well. Denies chest pain or SOB. Very anxious to go home- states she is willing to sign papers to go home.  LABS: Basic Metabolic Panel:  Recent Labs  16/10/96 0415 01/17/13 0539  NA 139 139  K 4.0 5.0  CL 104 103  CO2 28 29  GLUCOSE 109* 145*  BUN 6 14  CREATININE 0.71 0.90  CALCIUM 8.9 9.2   CBC:  Recent Labs  01/16/13 0415 01/17/13 0539  WBC 10.7* 9.8  HGB 10.1* 9.5*  HCT 30.9* 28.9*  MCV 89.6 89.2  PLT 339 318    Radiology/Studies:  Dg Chest 2 View  01/04/2013  *RADIOLOGY REPORT*  Clinical Data: Nausea, fever, diarrhea  CHEST - 2 VIEW  Comparison: Chest radiograph 12/10/2012  Findings: Normal cardiac silhouette.  The ascending aorta is prominent but unchanged.  There is mild scarring at the lung bases also unchanged.  Lateral projection demonstrates compression fractures in the mid thoracic spine.  Interval vertebral plasty. No acute osseous abnormality.  IMPRESSION:  1.  No acute cardiopulmonary findings. 2. Chronic bibasilar scarring. 3. Prominent ascending aorta.  Query aortic stenosis.   Original Report Authenticated By: Genevive Bi, M.D.    ECG: NSR, normal.  PHYSICAL EXAM General: Well developed, elderly, in no acute distress. Head: Normocephalic, atraumatic, sclera non-icteric, no xanthomas, nares are without discharge. Neck: Negative for carotid bruits. JVD not elevated. Lungs: Clear bilaterally to auscultation without wheezes,  rales, or rhonchi. Breathing is unlabored. Heart: RRR S1 S2 without murmurs, rubs, or gallops.  Abdomen: Soft, non-tender, non-distended with normoactive bowel sounds. No hepatomegaly. No rebound/guarding. No obvious abdominal masses. Extremities: No clubbing, cyanosis or edema.  Distal pedal pulses are 2+ and equal bilaterally. Right radial artery without hematoma. Good pulse. Right femoral area with mild tenderness to palpation and bruising but no significant hematoma. No bruuit Neuro: Alert and oriented X 3. Moves all extremities spontaneously. Psych:  Responds to questions appropriately with a normal affect.  ASSESSMENT AND PLAN: 1. NSTEMI. S/p PCI of RCA yesterday complicated by dissection. Successful PCI with 2 DES. Ecg now normal. No clinical symptoms of angina. Continue dual antiplatelet Rx with ASA and Plavix for one year if possible. 2. SVT resolved. Continue metoprolol. 3. Hematochezia. Rectal ulceration. No recurrent bleeding. 4. Anemia. Hgb drop consistent with blood loss from complicated PCI. 5. UTI 6. DM 7. Deconditioning. Will need further PT and OT before DC home. Per primary team.  Principal Problem:   UTI (lower urinary tract infection) Active Problems:   Hyponatremia   Type II or unspecified type diabetes mellitus without mention of complication, not stated as uncontrolled   Hypokalemia   Fever   H/O vertebroplasty   SVT (supraventricular tachycardia)   Non-ST elevation myocardial infarction (NSTEMI)   Hematochezia  Coronary atherosclerosis of native coronary artery   Oral thrush    Signed, Tamirra Sienkiewicz [place] MD,FACC 01/17/2013 8:49 AM

## 2013-01-17 NOTE — Progress Notes (Signed)
Went to provide patient with SNF bed offers and she has decided to go home- home health to be arranged- she was appreciative and pleasant with me- husband at bedside. CSW will sign off- Reece Levy, MSW, [place] [phone]

## 2013-01-17 NOTE — Discharge Summary (Signed)
Physician Discharge Summary  [name]:[id number] DOB: [birth year] DOA: 01/04/2013  PCP: Johny Blamer, MD  Admit date: 01/04/2013 Discharge date: 01/17/2013  Time spent: 48 minutes  Recommendations for Outpatient Follow-up:  1. Follow up with cardiology as recommended 2. Follow up with GI in one week to go over teh results of the biopsy of the rectal ulcers.  3. Follow upwith CBC in one week.   Discharge Diagnoses:  Principal Problem:   UTI (lower urinary tract infection) Active Problems:   Hyponatremia   Type II or unspecified type diabetes mellitus without mention of complication, not stated as uncontrolled   Hypokalemia   Fever   H/O vertebroplasty   SVT (supraventricular tachycardia)   Non-ST elevation myocardial infarction (NSTEMI)   Hematochezia   Coronary atherosclerosis of native coronary artery   Oral thrush   Discharge Condition: stable from medical stand point   Diet recommendation: low sodium carb modified diet  Filed Weights   01/15/13 0547 01/16/13 0500 01/17/13 0510  Weight: 86.1 kg (189 lb 13.1 oz) 84.142 kg (185 lb 8 oz) 83.553 kg (184 lb 3.2 oz)    History of present illness:   67 y.o. female with history of diabetes mellitus and hypertension, status post recent kyphoplasty (12/27/2012). She presented to [facility] ED 01/04/2013 with main concern of difficulty with ambulation at home having sustained fall after trying to get up from the bed the afternoon just prior to admission. She called 911 and upon their arrival, pt reported they checked her temperature which was 103.1 F. In ED, UA consistent with UTI and TRH asked to admit for further evaluation. Upon arrival to the floor, pt developed tachycardia, narrow complex SVT with rate of 200 bpm, elevated troponin 1.67, and mild ST depression in II and aVF. Cardiology consulted.  Pt was transferred to [facility] on 2/11 to undergo a cardiac cath. Said cath revealed a signif RCA stenosis. The plan was to work to achieve  greater medical stability, and then to intervene on her RCA stenosis.  She has completed a round of Rocephin on 01/11/13 to treat her UTI. Her electrolyte imbalances have corrected with restoration of volume and repletion of said lytes.  She has undergone a GI evaluation with colonoscopy which revealed several rectal ulcers. GI has approved use of anti-coagulation.  On 01/15/13 she underwent PCI x 2 with deployment of DES and was started on ASA and Plavix. She did have some post procedure bleeding into cath site without development of hematoma.  She appears quite deconditioned so PT/OT ordered and they have recommended the SNF vs home health with 24 hour supervision and assistance. She is adamant about going home today. She is refusing rehabilitation at [facility] . She was ready to sign out AMA to leave the hospital earlier today. Hence we have ordered home health PT/RN/HOME HEALTH AID/SW. She is refusing home health OT. We have ordered rolling walker and bed side commode.   Hospital Course:   E coli UTI (lower urinary tract infection)  - Urine culture positive for > 100K E.Coli pan sensitive  - completed a course of Rocephin 01/11/13 . SVT (supraventricular tachycardia)  - rate controlled w/B blocker  - suspect this was due to early sepsis + severe DH  - follow up with cardiology as recommended  NSTEMI - single vessel RCA stenosis  - 2 D ECHO realtively unremarkable but with grade I diastolic dysfunction  - post PCI w/ 2 DES (01/15/13)-continue dual anti-platelet rxn w/ ASA and Plavix x  12 momths  Rectal bleed  - GI/[facility]:post colonoscopy (01/14/13) demonstrated several small distal rectal ulcers (stercoral vs ischemic). Recommend follow up with GI in 1 to 2 weeks to go over the biopsy results.   Hyponatremia  - resolved  - likely due to volume depletion . And stable  Hypotension  - improved w/ volume and holding of diuretics -has tolerated resumption of BP meds  Type II or unspecified type diabetes  mellitus  - A1C stable, 6.1  - continue Lantus and SSI  - CBG well controlled . CBG (last 3)   Recent Labs  01/16/13 1628 01/16/13 2040 01/17/13 0724  GLUCAP 170* 156* 113*     Hypokalemia  - secondary to Lasix  - supplemented and within normal limits  Acute renal failure  - most likely secondary to pre-renal azotemia from [facility], infection, lasix)  - creatinine is within normal limits at this time . Resume ace inhibitor on discharge.  Anemia of chronic disease  - Hg and Hct stable and at pt's baseline  Diarrhea  - improved. - continues-not on laxatives  - C diff negative.   Procedures: 01/08/2013 - Cardiac catheterization  Conclusions:  1. 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 Mild LV dysfunction secondary to her recent Inferior wall NSTEMI.  01/12/13- Colonoscopy  Inadequate prep-diverticulosis, melanosis coli  01/14/13- Colonoscopy  ENDOSCOPIC IMPRESSION:  #1. Diffuse melanosis coli  #2. Diverticulosis  #3. Ulceration in the rectum  RECOMMENDATIONS:  follow clinically. Check biopsies. Proceed with further cardiac  evaluation. I see no reason based on the colonoscopy findings that  she could not be anticoagulated per cardiology if necessary.   On 01/15/13 she underwent PCI x 2 with deployment of DES and was started on ASA and Plavix.      Consultations:   Cardiology - [place]  [facility]  Discharge Exam: Filed Vitals:   01/16/13 1611 01/16/13 2100 01/16/13 2127 01/17/13 0510  BP: 138/70 151/77 151/77 137/83  Pulse: 79 86 86 94  Temp: 98.4 F (36.9 C) 98.2 F (36.8 C)  98.3 F (36.8 C)  TempSrc: Oral   Oral  Resp: 20 18  18   Height:      Weight:    83.553 kg (184 lb 3.2 oz)  SpO2: 98% 95%  96%   General: No acute respiratory distress  Lungs: Clear to auscultation bilaterally without wheezes or crackles, [place] oxygen  Cardiovascular: Regular rate and rhythm without murmur gallop or rub normal  Abdomen: Nontender, nondistended, soft, bowel  sounds positive, no rebound, no ascites, no appreciable mass  Extremities: No significant cyanosis, clubbing, or edema bilateral lower extremities; bruising right groin post cath   Discharge Instructions     Medication List    TAKE these medications       aspirin 325 MG tablet  Take 1 tablet (325 mg total) by mouth daily.     clopidogrel 75 MG tablet  Commonly known as:  PLAVIX  Take 1 tablet (75 mg total) by mouth daily with breakfast.     feeding supplement Liqd  Take 237 mLs by mouth 2 (two) times daily between meals.     furosemide 40 MG tablet  Commonly known as:  LASIX  Take 40 mg by mouth every morning.     insulin glargine 100 UNIT/ML injection  Commonly known as:  LANTUS  Inject 10 Units into the skin 2 (two) times daily.     metFORMIN 1000 MG tablet  Commonly known as:  GLUCOPHAGE  Take  1 tablet (1,000 mg total) by mouth 2 (two) times daily with a meal.     metoprolol 50 MG tablet  Commonly known as:  LOPRESSOR  Take 1 tablet (50 mg total) by mouth 2 (two) times daily.     multivitamin with minerals Tabs  Take 1 tablet by mouth every morning.     ondansetron 8 MG disintegrating tablet  Commonly known as:  ZOFRAN ODT  Take 1 tablet (8 mg total) by mouth every 8 (eight) hours as needed for nausea.     oxyCODONE-acetaminophen 5-325 MG per tablet  Commonly known as:  PERCOCET/ROXICET  Take 1 tablet by mouth every 4 (four) hours as needed. 1 to 2 tablets every 6 hours as needed for pain.     pioglitazone 30 MG tablet  Commonly known as:  ACTOS  Take 30 mg by mouth every morning.     ramipril 10 MG capsule  Commonly known as:  ALTACE  Take 10 mg by mouth daily at 12 noon.     simvastatin 40 MG tablet  Commonly known as:  ZOCOR  Take 40 mg by mouth at bedtime.     vitamin C 500 MG tablet  Commonly known as:  ASCORBIC ACID  Take 1,000 mg by mouth daily.     Vitamin D3 1000 UNITS Caps  Take 2,000 Units by mouth every morning.          The  results of significant diagnostics from this hospitalization (including imaging, microbiology, ancillary and laboratory) are listed below for reference.    Significant Diagnostic Studies: Dg Chest 2 View  01/04/2013  *RADIOLOGY REPORT*  Clinical Data: Nausea, fever, diarrhea  CHEST - 2 VIEW  Comparison: Chest radiograph 12/10/2012  Findings: Normal cardiac silhouette.  The ascending aorta is prominent but unchanged.  There is mild scarring at the lung bases also unchanged.  Lateral projection demonstrates compression fractures in the mid thoracic spine.  Interval vertebral plasty. No acute osseous abnormality.  IMPRESSION:  1.  No acute cardiopulmonary findings. 2. Chronic bibasilar scarring. 3. Prominent ascending aorta.  Query aortic stenosis.   Original Report Authenticated By: Genevive Bi, M.D.    Dg Chest 2 View  12/26/2012  *RADIOLOGY REPORT*  Clinical Data: T8 compression fracture.  Preoperative respiratory exam.  CHEST - 2 VIEW  Comparison: 12/10/2012  Findings: The heart size and pulmonary vascularity are normal. Tortuosity of the thoracic aorta.  The severity of the T8 compression fracture has increased since 12/10/2012.  Lungs are hyperinflated with flattening of the diaphragm consistent with emphysema.  Slight scarring at the lung bases.  There are multiple small nodular appearing artifacts from the patient's clothing.  IMPRESSION: Further compression of the T8 vertebral body.  Emphysema.   Original Report Authenticated By: Francene Boyers, M.D.    Dg Thoracic Spine 2 View  12/27/2012  *RADIOLOGY REPORT*  Clinical Data: KYPHOPLASTY.  DG C-ARM 61-120 MIN,THORACIC SPINE - 2 VIEW  Comparison: MRI 12/12/2012  Findings: Two intraoperative spot images demonstrate changes of single level vertebral augmentation, presumably at T8 although this cannot be confirmed on these intraoperative spot images.  No complicating feature.  IMPRESSION: Single level vertebral augmentation.  No complicating feature  visualized.   Original Report Authenticated By: Charlett Nose, M.D.    Dg Shoulder Right  01/04/2013  *RADIOLOGY REPORT*  Clinical Data: Larey Seat today with right shoulder pain  RIGHT SHOULDER - 2+ VIEW  Comparison: None.  Findings: The right humeral head is in normal position within the glenohumeral  joint space.  No acute fracture is seen.  The right [facility] joint is normally aligned.  There is right basilar atelectasis noted.  IMPRESSION: No fracture or dislocation.   Original Report Authenticated By: Dwyane Dee, M.D.    Dg C-arm [id number] Min  12/27/2012  *RADIOLOGY REPORT*  Clinical Data: KYPHOPLASTY.  DG C-ARM 61-120 MIN,THORACIC SPINE - 2 VIEW  Comparison: MRI 12/12/2012  Findings: Two intraoperative spot images demonstrate changes of single level vertebral augmentation, presumably at T8 although this cannot be confirmed on these intraoperative spot images.  No complicating feature.  IMPRESSION: Single level vertebral augmentation.  No complicating feature visualized.   Original Report Authenticated By: Charlett Nose, M.D.     Microbiology: Recent Results (from the past 240 hour(s))  CLOSTRIDIUM DIFFICILE BY PCR     Status: None   Collection Time    01/09/13  5:51 AM      Result Value Range Status   C difficile by pcr NEGATIVE  NEGATIVE Final  MRSA PCR SCREENING     Status: None   Collection Time    01/15/13  6:55 PM      Result Value Range Status   MRSA by PCR NEGATIVE  NEGATIVE Final   Comment:            The GeneXpert MRSA Assay (FDA     approved for NASAL specimens     only), is one component of a     comprehensive MRSA colonization     surveillance program. It is not     intended to diagnose MRSA     infection nor to guide or     monitor treatment for     MRSA infections.     Labs: Basic Metabolic Panel:  Recent Labs Lab 01/13/13 0518 01/14/13 0555 01/15/13 0520 01/16/13 0415 01/17/13 0539  NA 140 139 140 139 139  K 3.5 3.4* 4.1 4.0 5.0  CL 103 102 106 104 103  CO2 27 27 25 28  29   GLUCOSE 97 93 137* 109* 145*  BUN 7 5* 8 6 14   CREATININE 0.69 0.70 0.68 0.71 0.90  CALCIUM 8.8 8.9 9.1 8.9 9.2   Liver Function Tests: No results found for this basename: AST, ALT, ALKPHOS, BILITOT, PROT, ALBUMIN,  in the last 168 hours No results found for this basename: LIPASE, AMYLASE,  in the last 168 hours No results found for this basename: AMMONIA,  in the last 168 hours CBC:  Recent Labs Lab 01/14/13 0555 01/15/13 0520 01/16/13 0016 01/16/13 0415 01/17/13 0539  WBC 8.5 10.6* 13.0* 10.7* 9.8  HGB 10.9* 11.0* 10.5* 10.1* 9.5*  HCT 33.9* 33.5* 31.7* 30.9* 28.9*  MCV 89.7 89.3 89.8 89.6 89.2  PLT 306 338 326 339 318   Cardiac Enzymes: No results found for this basename: CKTOTAL, CKMB, CKMBINDEX, TROPONINI,  in the last 168 hours BNP: BNP (last 3 results) No results found for this basename: PROBNP,  in the last 8760 hours CBG:  Recent Labs Lab 01/16/13 0748 01/16/13 1256 01/16/13 1628 01/16/13 2040 01/17/13 0724  GLUCAP 88 166* 170* 156* 113*       Signed:  Emera Bussie  [facility] 01/17/2013, 10:29 AM

## 2013-02-12 NOTE — Discharge Summary (Signed)
Patient ID: [name] MRN: [id number] DOB/AGE: 69/03/1946 69 y.o.  Admit date: 12/27/2012 Discharge date: 12/28/2012  Admission Diagnoses:  T8 Compression Fx  Discharge Diagnoses:  S/p T8 kyphoplasty   Past Medical History  Diagnosis Date  . Diabetes mellitus   . Hypertension   . Hypercholesteremia     Surgeries: Procedure(s): KYPHOPLASTY on 12/27/2012 - 12/28/2012   Discharged Condition: Improved  Hospital Course: [name] is an 67 y.o. female who was admitted 12/27/2012 for operative treatment of T8 Compression Fx. Patient has severe unremitting pain that affects sleep, daily activities, and work/hobbies. After pre-op clearance the patient was taken to the operating room on 12/27/2012 - 12/28/2012 and underwent  Procedure(s): T8 KYPHOPLASTY.    Patient was given perioperative antibiotics:  Anti-infectives   Start     Dose/Rate Route Frequency Ordered Stop   12/27/12 1945  ceFAZolin (ANCEF) IVPB 1 g/50 mL premix  Status:  Discontinued     1 g 100 mL/hr over 30 Minutes Intravenous Every 8 hours 12/27/12 1932 12/27/12 2050   12/27/12 0600  ceFAZolin (ANCEF) IVPB 2 g/50 mL premix     2 g 100 mL/hr over 30 Minutes Intravenous On call to O.R. 12/26/12 1241 12/27/12 1640       Patient was given sequential compression devices, early ambulation, and chemoprophylaxis to prevent DVT.  Patient benefited maximally from hospital stay and there were no complications.    Recent vital signs: BP 166/65  Pulse 79  Temp(Src) 98 F (36.7 C) (Oral)  Resp 19  SpO2 98%  Discharge Medications:     Medication List    STOP taking these medications       cyclobenzaprine 5 MG tablet  Commonly known as:  FLEXERIL     ibuprofen 800 MG tablet  Commonly known as:  ADVIL,MOTRIN     oxyCODONE 5 MG immediate release tablet  Commonly known as:  ROXICODONE     oxyCODONE-acetaminophen 5-325 MG per tablet  Commonly known as:  PERCOCET     SALMON PO      TAKE these medications        furosemide 40 MG tablet  Commonly known as:  LASIX  Take 40 mg by mouth every morning.     insulin glargine 100 UNIT/ML injection  Commonly known as:  LANTUS  Inject 10 Units into the skin 2 (two) times daily.     metFORMIN 1000 MG tablet  Commonly known as:  GLUCOPHAGE  Take 1 tablet (1,000 mg total) by mouth 2 (two) times daily with a meal.     multivitamin with minerals Tabs  Take 1 tablet by mouth every morning.     ondansetron 8 MG disintegrating tablet  Commonly known as:  ZOFRAN ODT  Take 1 tablet (8 mg total) by mouth every 8 (eight) hours as needed for nausea.     ramipril 10 MG capsule  Commonly known as:  ALTACE  Take 10 mg by mouth daily at 12 noon.     simvastatin 40 MG tablet  Commonly known as:  ZOCOR  Take 40 mg by mouth at bedtime.     Vitamin D3 1000 UNITS Caps  Take 2,000 Units by mouth every morning.        Disposition: 06-Home-Health Care Svc      Discharge Orders   Future Orders Complete By Expires     Discharge patient  As directed          Signed: Georga Bora 02/12/2013, 10:00  AM

## 2013-04-12 ENCOUNTER — Telehealth: Payer: Self-pay | Admitting: Cardiology

## 2013-04-12 NOTE — Telephone Encounter (Signed)
New problem   Pt had cath done by Dr Swaziland 01/15/2013 pt want to speak to a nurse about heart fluttering. Pt hasn't been in for f/u visit. Please call pt

## 2013-04-12 NOTE — Telephone Encounter (Signed)
Patient called in regarding heart fluttering occasionally and wanting to know if she should continue her Aspirin 325 mg daily as recommended at discharge. Reviewed patients chart and she was d/c from hospital after having a cath in Feb and no follow up appointment made.  Advised to continue same dose of medications and scheduled ov with Bing Neighbors. PA on 04/16/13. Patient verbalized understanding. Patient states she is doing great.

## 2013-04-16 ENCOUNTER — Ambulatory Visit (INDEPENDENT_AMBULATORY_CARE_PROVIDER_SITE_OTHER): Payer: Medicare Other | Admitting: Physician Assistant

## 2013-04-16 ENCOUNTER — Encounter: Payer: Self-pay | Admitting: Physician Assistant

## 2013-04-16 VITALS — BP 118/60 | HR 68 | Ht 68.0 in | Wt 185.8 lb

## 2013-04-16 DIAGNOSIS — I2510 Atherosclerotic heart disease of native coronary artery without angina pectoris: Secondary | ICD-10-CM

## 2013-04-16 MED ORDER — ASPIRIN EC 81 MG PO TBEC: 81.0000 mg | DELAYED_RELEASE_TABLET | Freq: Every day | ORAL | Status: DC

## 2013-04-16 MED ORDER — NITROGLYCERIN 0.4 MG SL SUBL: 0.4000 mg | SUBLINGUAL_TABLET | SUBLINGUAL | Status: AC | PRN

## 2013-04-16 NOTE — Progress Notes (Signed)
[address] [place], [place]  [address] Phone: [phone] Fax:  [phone]  Date:  04/16/2013   ID:  [name], DOB [birth year], MRN [id number]  PCP:  Johny Blamer, MD  Primary Cardiologist:  Dr. Peter Swaziland      History of Present Illness: [name] is a 67 y.o. female who returns for follow up. She has a history of HTN, HL, DM2. She was admitted to the hospital 2/7-2/20 with a non-STEMI in the setting of SVT and urinary tract infection.  Echo 01/05/13: EF 55-65%, grade 1 diastolic dysfunction, MAC.  LHC 01/08/13: Proximal LAD 30%, ostial diagonal 30-40%, proximal RCA 95%, inferior AK, EF 45-50%.  Patient reported symptoms of rectal bleeding and was seen by gastroenterology. Colonoscopy demonstrated several small distal rectal ulcers. It was felt that she could be anticoagulated for PCI.  She underwent PCI 01/15/13: Promus premier DES x2 to the RCA. Dual antiplatelet therapy recommended for one year.  She was maintained on beta blocker for her SVT. She had no recurrence.  She was lost to f/u after d/c.  She is doing well.  Denies chest pain, dyspnea, syncope, orthopnea, PND, edema.  She has occasional "flutters."  These are short lived and she denies assoc symptoms.   Labs (2/14):  K 5, Cr 0.90, ALT 10, Hgb 9.5, TSH 0.434  Wt Readings from Last 3 Encounters:  04/16/13 185 lb 12.8 oz (84.278 kg)  01/17/13 184 lb 3.2 oz (83.553 kg)  01/17/13 184 lb 3.2 oz (83.553 kg)     Past Medical History  Diagnosis Date  . Diabetes mellitus   . Hypertension   . Hypercholesteremia   . CAD (coronary artery disease)     a. NSTEMI in setting of UTI and SVT 12/2012 => LHC 01/08/13: Proximal LAD 30%, ostial diagonal 30-40%, proximal RCA 95%, inferior AK, EF 45-50% => PCI: Promus DES x 2 to RCA;  b. Echo 01/05/13: EF 55-65%, grade 1 diastolic dysfunction, MAC   . SVT (supraventricular tachycardia)   . Rectal ulceration     colonoscopy 12/2012    Current Outpatient  Prescriptions  Medication Sig Dispense Refill  . aspirin 325 MG tablet Take 1 tablet (325 mg total) by mouth daily.  30 tablet  1  . Cholecalciferol (VITAMIN D3) 1000 UNITS CAPS Take 2,000 Units by mouth every morning.       . clopidogrel (PLAVIX) 75 MG tablet Take 1 tablet (75 mg total) by mouth daily with breakfast.  30 tablet  1  . furosemide (LASIX) 40 MG tablet Take 40 mg by mouth every morning.       . insulin glargine (LANTUS) 100 UNIT/ML injection Inject 8 Units into the skin 2 (two) times daily.       . metFORMIN (GLUCOPHAGE) 1000 MG tablet Take 1 tablet (1,000 mg total) by mouth 2 (two) times daily with a meal.  60 tablet  0  . metoprolol (LOPRESSOR) 50 MG tablet Take 1 tablet (50 mg total) by mouth 2 (two) times daily.  60 tablet  1  . Multiple Vitamin (MULITIVITAMIN WITH MINERALS) TABS Take 1 tablet by mouth every morning.       . ondansetron (ZOFRAN ODT) 8 MG disintegrating tablet Take 1 tablet (8 mg total) by mouth every 8 (eight) hours as needed for nausea.  20 tablet  0  . oxyCODONE-acetaminophen (PERCOCET/ROXICET) 5-325 MG per tablet Take 1 tablet by mouth every 4 (four) hours as needed. 1 to 2 tablets  every 6 hours as needed for pain.      . pioglitazone (ACTOS) 30 MG tablet Take 30 mg by mouth every morning.      . ramipril (ALTACE) 10 MG capsule Take 10 mg by mouth daily at 12 noon.       . simvastatin (ZOCOR) 40 MG tablet Take 40 mg by mouth at bedtime.      . vitamin C (ASCORBIC ACID) 500 MG tablet Take 1,000 mg by mouth daily.      . vitamin E 200 UNIT capsule Take 200 Units by mouth daily.       No current facility-administered medications for this visit.    Allergies:   No Known Allergies  Social History:  The patient  reports that she has never smoked. She has never used smokeless tobacco. She reports that she does not drink alcohol or use illicit drugs.   ROS:  Please see the history of present illness.   No melena, hematochezia.   All other systems reviewed and  negative.   PHYSICAL EXAM: VS:  BP 118/60  Pulse 68  Ht 5\' 8"  (1.727 m)  Wt 185 lb 12.8 oz (84.278 kg)  BMI 28.26 kg/m2 Well nourished, well developed, in no acute distress HEENT: normal Neck: no JVD Cardiac:  normal S1, S2; RRR; no murmur Lungs:  clear to auscultation bilaterally, no wheezing, rhonchi or rales Abd: soft, nontender, no hepatomegaly Ext: no edema; right groin without hematoma or bruit  Skin: warm and dry Neuro:  CNs 2-12 intact, no focal abnormalities noted  EKG:  NSR, HR 68, no acute changes     ASSESSMENT AND PLAN:  1. CAD:  Doing well s/p PCI in setting of NSTEMI.  We discussed the importance of dual antiplatelet therapy.  She can reduce ASA to 81 mg QD.  Will also give a Rx for prn NTG and we discussed how to use this.  Refer to cardiac rehab.  Continue statin. 2. Hypertension:  Controlled.  Continue current therapy.  3. Hyperlipidemia:  Managed by PCP.  4. SVT:  This was in the setting of UTI.  She has occasional palpitations but nothing like what she had in the hospital in 12/2012.  Continue beta blocker.  If symptoms worsen, continue event monitor.  5. Disposition:  Initially seen by Dr. Antoine Poche in the hospital.  But, she remembers Dr. Swaziland who did her PCI and requests to f/u with him.  F/u with Dr. Peter Swaziland in 3 mos.   Signed, Tereso Newcomer, PA-C  2:55 PM 04/16/2013

## 2013-04-16 NOTE — Patient Instructions (Addendum)
Your physician has recommended you make the following change in your medication: start taking Nitroglycerin 0.4 mg as needed for chest pain and decrease Aspirin to 81 mg daily (Please do not stop taking Aspirin or Plavix unless Dr Swaziland recommends that you do)  Your physician recommends that you schedule a follow-up appointment in: 3 months  Your provider recommends that you start cardiac rehab, we will arrange.

## 2013-07-18 ENCOUNTER — Encounter: Payer: Self-pay | Admitting: Cardiology

## 2013-07-18 ENCOUNTER — Ambulatory Visit (INDEPENDENT_AMBULATORY_CARE_PROVIDER_SITE_OTHER): Payer: Medicare Other | Admitting: Cardiology

## 2013-07-18 VITALS — BP 144/84 | HR 64 | Ht 68.0 in | Wt 197.8 lb

## 2013-07-18 DIAGNOSIS — E119 Type 2 diabetes mellitus without complications: Secondary | ICD-10-CM

## 2013-07-18 DIAGNOSIS — I498 Other specified cardiac arrhythmias: Secondary | ICD-10-CM

## 2013-07-18 DIAGNOSIS — I2510 Atherosclerotic heart disease of native coronary artery without angina pectoris: Secondary | ICD-10-CM

## 2013-07-18 DIAGNOSIS — I471 Supraventricular tachycardia: Secondary | ICD-10-CM

## 2013-07-18 NOTE — Progress Notes (Signed)
[name] Date of Birth: 08-31-1946 Medical Record #[id number]  History of Present Illness: [name] is seen today for followup. She has a history of coronary disease. She was admitted in February of this year with a non-ST elevation myocardial infarction in the setting of SVT and urinary tract infection. Cardiac catheterization demonstrated a high-grade stenosis in the proximal RCA. She underwent successful stenting on 01/15/2013 with DES x2. Echocardiogram showed normal ejection fraction. By cardiac catheterization her ejection fraction was 45-50% with inferior wall akinesis. She has done very well since discharge. She's had no recurrent chest pain or shortness of breath. She enjoys walking a lot. She states she feels better every day. She sees Dr. Leslie Dales for management of her diabetes. She has gained 12 pounds over the last 3 months but she reports that her weight has stabilized.  Current Outpatient Prescriptions on File Prior to Visit  Medication Sig Dispense Refill  . aspirin EC 81 MG tablet Take 1 tablet (81 mg total) by mouth daily.  90 tablet  0  . Cholecalciferol (VITAMIN D3) 1000 UNITS CAPS Take 2,000 Units by mouth every morning.       . clopidogrel (PLAVIX) 75 MG tablet Take 1 tablet (75 mg total) by mouth daily with breakfast.  30 tablet  1  . furosemide (LASIX) 40 MG tablet Take 40 mg by mouth every morning.       . insulin glargine (LANTUS) 100 UNIT/ML injection Inject 5 Units into the skin 2 (two) times daily.       . metFORMIN (GLUCOPHAGE) 1000 MG tablet Take 1 tablet (1,000 mg total) by mouth 2 (two) times daily with a meal.  60 tablet  0  . metoprolol (LOPRESSOR) 50 MG tablet Take 1 tablet (50 mg total) by mouth 2 (two) times daily.  60 tablet  1  . Multiple Vitamin (MULITIVITAMIN WITH MINERALS) TABS Take 1 tablet by mouth every morning.       . nitroGLYCERIN (NITROSTAT) 0.4 MG SL tablet Place 1 tablet (0.4 mg total) under the tongue every 5 (five) minutes as needed  for chest pain.  25 tablet  3  . ondansetron (ZOFRAN ODT) 8 MG disintegrating tablet Take 1 tablet (8 mg total) by mouth every 8 (eight) hours as needed for nausea.  20 tablet  0  . oxyCODONE-acetaminophen (PERCOCET/ROXICET) 5-325 MG per tablet Take 1 tablet by mouth every 4 (four) hours as needed. 1 to 2 tablets every 6 hours as needed for pain.      . pioglitazone (ACTOS) 30 MG tablet Take 30 mg by mouth every morning.      . ramipril (ALTACE) 10 MG capsule Take 10 mg by mouth daily at 12 noon.       . simvastatin (ZOCOR) 40 MG tablet Take 40 mg by mouth at bedtime.       No current facility-administered medications on file prior to visit.    No Known Allergies  Past Medical History  Diagnosis Date  . Diabetes mellitus   . Hypertension   . Hypercholesteremia   . CAD (coronary artery disease)     a. NSTEMI in setting of UTI and SVT 12/2012 => LHC 01/08/13: Proximal LAD 30%, ostial diagonal 30-40%, proximal RCA 95%, inferior AK, EF 45-50% => PCI: Promus DES x 2 to RCA;  b. Echo 01/05/13: EF 55-65%, grade 1 diastolic dysfunction, MAC   . SVT (supraventricular tachycardia)   . Rectal ulceration     colonoscopy 12/2012  Past Surgical History  Procedure Laterality Date  . Eye surgery      laser , cat bil  . Tubal ligation    . Kyphoplasty  12/27/2012    Procedure: KYPHOPLASTY;  Surgeon: Emilee Hero, MD;  Location: [facility];  Service: Orthopedics;  Laterality: Bilateral;  T-8 kyphoplasty  . Colonoscopy Left 01/12/2013    Procedure: COLONOSCOPY;  Surgeon: Graylin Shiver, MD;  Location: [facility] ENDOSCOPY;  Service: Endoscopy;  Laterality: Left;  . Colonoscopy N/A 01/14/2013    Procedure: COLONOSCOPY;  Surgeon: Graylin Shiver, MD;  Location: [facility] ENDOSCOPY;  Service: Endoscopy;  Laterality: N/A;  Rm 2034, attemped Colon on 2-15=inadequate prep    History  Smoking status  . Never Smoker   Smokeless tobacco  . Never Used    History  Alcohol Use No    History reviewed. No pertinent  family history.  Review of Systems: The review of systems is positive for decreased vision related to several prior eye surgeries.  All other systems were reviewed and are negative.  Physical Exam: BP 144/84  Pulse 64  Ht 5\' 8"  (1.727 m)  Wt 197 lb 12.8 oz (89.721 kg)  BMI 30.08 kg/m2 She is a very pleasant white female in no acute distress. HEENT: Normocephalic, atraumatic. Pupils equal round and reactive light accommodation. Extraocular movements are full. She does wear glasses. Oropharynx is clear. Neck is supple without JVD, adenopathy, thyromegaly, or bruits. Lungs: Clear Cardiovascular: Regular rate and rhythm. Normal S1 and S2. No gallop, murmur, or click. Abdomen: Obese, soft, nontender. No masses or bruits. Extremities: No cyanosis or edema. Pulses are 2+. Skin: Warm and dry Neuro: Alert and oriented x3. Cranial nerves II through XII are intact. LABORATORY DATA: Lab Results  Component Value Date   WBC 9.8 01/17/2013   HGB 9.5* 01/17/2013   HCT 28.9* 01/17/2013   PLT 318 01/17/2013   GLUCOSE 145* 01/17/2013   ALT 10 01/04/2013   AST 11 01/04/2013   NA 139 01/17/2013   K 5.0 01/17/2013   CL 103 01/17/2013   CREATININE 0.90 01/17/2013   BUN 14 01/17/2013   CO2 29 01/17/2013   TSH 0.434 01/05/2013   INR 1.15 01/07/2013   HGBA1C 5.7* 01/05/2013     Assessment / Plan: 1. Coronary disease status post non-ST elevation myocardial infarction February 2014. Status post stenting of the RCA with drug-eluting stents. We'll continue dual antiplatelet therapy for one year and then stop her Plavix. Continue risk factor modification. I will followup again in 6 months. 2. Diabetes mellitus 3. Hypertension-blood pressure is mildly elevated. I recommended that she get a home blood pressure monitor and keep a diary. If elevated we may need to add additional treatment. 4. Hyperlipidemia-on Zocor.

## 2013-07-18 NOTE — Patient Instructions (Addendum)
Continue your current therapy. You should get a home BP monitor to keep track of your BP.    Try and keep your weight down  Continue your exercise.  I will see you in 6 months.

## 2013-11-12 ENCOUNTER — Emergency Department (HOSPITAL_COMMUNITY): Payer: Medicare Other

## 2013-11-12 ENCOUNTER — Inpatient Hospital Stay (HOSPITAL_COMMUNITY): Payer: Medicare Other

## 2013-11-12 ENCOUNTER — Inpatient Hospital Stay (HOSPITAL_COMMUNITY)
Admission: EM | Admit: 2013-11-12 | Discharge: 2013-11-13 | DRG: 690 | Disposition: A | Discharge status: Home Health | Payer: Medicare Other | Attending: Internal Medicine | Admitting: Internal Medicine

## 2013-11-12 ENCOUNTER — Encounter (HOSPITAL_COMMUNITY): Payer: Self-pay | Admitting: Emergency Medicine

## 2013-11-12 DIAGNOSIS — T148XXA Other injury of unspecified body region, initial encounter: Secondary | ICD-10-CM | POA: Diagnosis present

## 2013-11-12 DIAGNOSIS — E119 Type 2 diabetes mellitus without complications: Secondary | ICD-10-CM | POA: Diagnosis present

## 2013-11-12 DIAGNOSIS — I10 Essential (primary) hypertension: Secondary | ICD-10-CM | POA: Diagnosis present

## 2013-11-12 DIAGNOSIS — I252 Old myocardial infarction: Secondary | ICD-10-CM

## 2013-11-12 DIAGNOSIS — Z7982 Long term (current) use of aspirin: Secondary | ICD-10-CM

## 2013-11-12 DIAGNOSIS — S0990XA Unspecified injury of head, initial encounter: Secondary | ICD-10-CM

## 2013-11-12 DIAGNOSIS — N390 Urinary tract infection, site not specified: Principal | ICD-10-CM | POA: Diagnosis present

## 2013-11-12 DIAGNOSIS — S8001XA Contusion of right knee, initial encounter: Secondary | ICD-10-CM

## 2013-11-12 DIAGNOSIS — I2510 Atherosclerotic heart disease of native coronary artery without angina pectoris: Secondary | ICD-10-CM | POA: Diagnosis present

## 2013-11-12 DIAGNOSIS — Z794 Long term (current) use of insulin: Secondary | ICD-10-CM

## 2013-11-12 DIAGNOSIS — S8000XA Contusion of unspecified knee, initial encounter: Secondary | ICD-10-CM | POA: Diagnosis present

## 2013-11-12 DIAGNOSIS — E785 Hyperlipidemia, unspecified: Secondary | ICD-10-CM | POA: Diagnosis present

## 2013-11-12 DIAGNOSIS — W19XXXA Unspecified fall, initial encounter: Secondary | ICD-10-CM

## 2013-11-12 DIAGNOSIS — S0010XA Contusion of unspecified eyelid and periocular area, initial encounter: Secondary | ICD-10-CM | POA: Diagnosis present

## 2013-11-12 DIAGNOSIS — W19XXXD Unspecified fall, subsequent encounter: Secondary | ICD-10-CM

## 2013-11-12 DIAGNOSIS — W010XXA Fall on same level from slipping, tripping and stumbling without subsequent striking against object, initial encounter: Secondary | ICD-10-CM | POA: Diagnosis present

## 2013-11-12 DIAGNOSIS — S63509A Unspecified sprain of unspecified wrist, initial encounter: Secondary | ICD-10-CM | POA: Diagnosis present

## 2013-11-12 DIAGNOSIS — E7800 Pure hypercholesterolemia, unspecified: Secondary | ICD-10-CM | POA: Diagnosis present

## 2013-11-12 DIAGNOSIS — S63502A Unspecified sprain of left wrist, initial encounter: Secondary | ICD-10-CM

## 2013-11-12 DIAGNOSIS — R55 Syncope and collapse: Secondary | ICD-10-CM | POA: Diagnosis present

## 2013-11-12 LAB — CBC WITH DIFFERENTIAL/PLATELET
Basophils Absolute: 0 10*3/uL (ref 0.0–0.1)
Basophils Relative: 0 % (ref 0–1)
Eosinophils Relative: 1 % (ref 0–5)
HCT: 34.7 % — ABNORMAL LOW (ref 36.0–46.0)
Lymphocytes Relative: 15 % (ref 12–46)
MCH: 30.7 pg (ref 26.0–34.0)
MCHC: 32.6 g/dL (ref 30.0–36.0)
MCV: 94.3 fL (ref 78.0–100.0)
Monocytes Absolute: 0.7 10*3/uL (ref 0.1–1.0)
RDW: 13.1 % (ref 11.5–15.5)

## 2013-11-12 LAB — COMPREHENSIVE METABOLIC PANEL
AST: 14 U/L (ref 0–37)
CO2: 27 mEq/L (ref 19–32)
Calcium: 8.9 mg/dL (ref 8.4–10.5)
Creatinine, Ser: 0.84 mg/dL (ref 0.50–1.10)
GFR calc non Af Amer: 69 mL/min — ABNORMAL LOW (ref >90)

## 2013-11-12 LAB — URINALYSIS, ROUTINE W REFLEX MICROSCOPIC
Ketones, ur: NEGATIVE mg/dL
Nitrite: POSITIVE — AB
Protein, ur: NEGATIVE mg/dL
Urobilinogen, UA: 0.2 mg/dL (ref 0.0–1.0)
pH: 6.5 (ref 5.0–8.0)

## 2013-11-12 LAB — PROTIME-INR: INR: 1.06 (ref 0.00–1.49)

## 2013-11-12 LAB — GLUCOSE, CAPILLARY

## 2013-11-12 MED ORDER — CLOPIDOGREL BISULFATE 75 MG PO TABS
75.0000 mg | ORAL_TABLET | Freq: Every day | ORAL | Status: DC
  Administered 2013-11-13: 06:00:00 75 mg via ORAL
  Filled 2013-11-12 (×2): qty 1

## 2013-11-12 MED ORDER — DEXTROSE 5 % IV SOLN
1.0000 g | INTRAVENOUS | Status: DC
  Filled 2013-11-12: qty 10

## 2013-11-12 MED ORDER — FUROSEMIDE 40 MG PO TABS
40.0000 mg | ORAL_TABLET | Freq: Every morning | ORAL | Status: DC
  Administered 2013-11-12 – 2013-11-13 (×2): 40 mg via ORAL
  Filled 2013-11-12 (×2): qty 1

## 2013-11-12 MED ORDER — SODIUM CHLORIDE 0.9 % IV SOLN
INTRAVENOUS | Status: AC
Start: 2013-11-12 — End: 2013-11-12
  Administered 2013-11-12: 08:00:00 via INTRAVENOUS

## 2013-11-12 MED ORDER — ONDANSETRON HCL 4 MG/2ML IJ SOLN
4.0000 mg | Freq: Once | INTRAMUSCULAR | Status: AC
  Administered 2013-11-12: 4 mg via INTRAVENOUS
  Filled 2013-11-12: qty 2

## 2013-11-12 MED ORDER — ACETAMINOPHEN 650 MG RE SUPP: 650.0000 mg | Freq: Four times a day (QID) | RECTAL | Status: DC | PRN

## 2013-11-12 MED ORDER — ONDANSETRON HCL 4 MG/2ML IJ SOLN: 4.0000 mg | Freq: Four times a day (QID) | INTRAMUSCULAR | Status: DC | PRN

## 2013-11-12 MED ORDER — CEPHALEXIN 250 MG PO CAPS: 250.0000 mg | ORAL_CAPSULE | Freq: Four times a day (QID) | ORAL | Status: DC

## 2013-11-12 MED ORDER — DEXTROSE 5 % IV SOLN
1.0000 g | Freq: Once | INTRAVENOUS | Status: AC
  Administered 2013-11-12: 1 g via INTRAVENOUS
  Filled 2013-11-12: qty 10

## 2013-11-12 MED ORDER — INSULIN GLARGINE 100 UNIT/ML ~~LOC~~ SOLN
5.0000 [IU] | Freq: Two times a day (BID) | SUBCUTANEOUS | Status: DC
  Administered 2013-11-12 – 2013-11-13 (×3): 5 [IU] via SUBCUTANEOUS
  Filled 2013-11-12 (×4): qty 0.05

## 2013-11-12 MED ORDER — ACETAMINOPHEN 325 MG PO TABS: 650.0000 mg | ORAL_TABLET | Freq: Four times a day (QID) | ORAL | Status: DC | PRN

## 2013-11-12 MED ORDER — TRAMADOL HCL 50 MG PO TABS: 50.0000 mg | ORAL_TABLET | Freq: Four times a day (QID) | ORAL | Status: DC | PRN

## 2013-11-12 MED ORDER — MORPHINE SULFATE 4 MG/ML IJ SOLN
4.0000 mg | Freq: Once | INTRAMUSCULAR | Status: AC
  Administered 2013-11-12: 4 mg via INTRAVENOUS
  Filled 2013-11-12: qty 1

## 2013-11-12 MED ORDER — ONDANSETRON HCL 4 MG PO TABS: 4.0000 mg | ORAL_TABLET | Freq: Four times a day (QID) | ORAL | Status: DC | PRN

## 2013-11-12 MED ORDER — INSULIN ASPART 100 UNIT/ML ~~LOC~~ SOLN
0.0000 [IU] | Freq: Three times a day (TID) | SUBCUTANEOUS | Status: DC
  Administered 2013-11-12: 1 [IU] via SUBCUTANEOUS
  Administered 2013-11-12: 2 [IU] via SUBCUTANEOUS

## 2013-11-12 MED ORDER — SODIUM CHLORIDE 0.9 % IV SOLN
INTRAVENOUS | Status: DC
  Administered 2013-11-12 (×2): via INTRAVENOUS

## 2013-11-12 MED ORDER — SIMVASTATIN 40 MG PO TABS
40.0000 mg | ORAL_TABLET | Freq: Every day | ORAL | Status: DC
  Administered 2013-11-12: 22:00:00 40 mg via ORAL
  Filled 2013-11-12 (×2): qty 1

## 2013-11-12 MED ORDER — SODIUM CHLORIDE 0.9 % IJ SOLN
3.0000 mL | Freq: Two times a day (BID) | INTRAMUSCULAR | Status: DC
  Administered 2013-11-13: 07:00:00 3 mL via INTRAVENOUS

## 2013-11-12 MED ORDER — ASPIRIN EC 81 MG PO TBEC
81.0000 mg | DELAYED_RELEASE_TABLET | Freq: Every day | ORAL | Status: DC
  Administered 2013-11-13: 81 mg via ORAL
  Filled 2013-11-12: qty 1

## 2013-11-12 MED ORDER — METOPROLOL TARTRATE 50 MG PO TABS
50.0000 mg | ORAL_TABLET | Freq: Two times a day (BID) | ORAL | Status: DC
  Administered 2013-11-12 – 2013-11-13 (×3): 50 mg via ORAL
  Filled 2013-11-12 (×4): qty 1

## 2013-11-12 MED ORDER — RAMIPRIL 10 MG PO CAPS
10.0000 mg | ORAL_CAPSULE | Freq: Every day | ORAL | Status: DC
  Administered 2013-11-12 – 2013-11-13 (×2): 10 mg via ORAL
  Filled 2013-11-12 (×2): qty 1

## 2013-11-12 MED ORDER — SODIUM CHLORIDE 0.9 % IV BOLUS (SEPSIS)
500.0000 mL | Freq: Once | INTRAVENOUS | Status: AC
  Administered 2013-11-12: 1000 mL via INTRAVENOUS

## 2013-11-12 NOTE — ED Notes (Signed)
Carb Mod Diet Ordered spoke with Thayer Ohm

## 2013-11-12 NOTE — ED Provider Notes (Addendum)
CSN: [id number]     Arrival date & time 11/12/13  0258 History   First MD Initiated Contact with Patient 11/12/13 0303     Chief Complaint  Patient presents with  . Fall    left wrist swelling and right knee   (Consider location/radiation/quality/duration/timing/severity/associated sxs/prior Treatment) HPI Patient state that she tripped over her shoelaces beer yesterday evening around 5 PM. She struck her face on the couch. She denies any loss of consciousness. She did land on her left wrist and right knee. She's had increased swelling and pain in both. She is continued to be ambulatory. She has had some mild nausea and fatigue since the fall. She denies any focal weakness or numbness. She's had no vision changes. Past Medical History  Diagnosis Date  . Diabetes mellitus   . Hypertension   . Hypercholesteremia   . CAD (coronary artery disease)     a. NSTEMI in setting of UTI and SVT 12/2012 => LHC 01/08/13: Proximal LAD 30%, ostial diagonal 30-40%, proximal RCA 95%, inferior AK, EF 45-50% => PCI: Promus DES x 2 to RCA;  b. Echo 01/05/13: EF 55-65%, grade 1 diastolic dysfunction, MAC   . SVT (supraventricular tachycardia)   . Rectal ulceration     colonoscopy 12/2012   Past Surgical History  Procedure Laterality Date  . Eye surgery      laser , cat bil  . Tubal ligation    . Kyphoplasty  12/27/2012    Procedure: KYPHOPLASTY;  Surgeon: Emilee Hero, MD;  Location: [facility] OR;  Service: Orthopedics;  Laterality: Bilateral;  T-8 kyphoplasty  . Colonoscopy Left 01/12/2013    Procedure: COLONOSCOPY;  Surgeon: Graylin Shiver, MD;  Location: [facility] ENDOSCOPY;  Service: Endoscopy;  Laterality: Left;  . Colonoscopy N/A 01/14/2013    Procedure: COLONOSCOPY;  Surgeon: Graylin Shiver, MD;  Location: [facility] ENDOSCOPY;  Service: Endoscopy;  Laterality: N/A;  Rm 2034, attemped Colon on 2-15=inadequate prep   History reviewed. No pertinent family history. History  Substance Use Topics  . Smoking status: Never  Smoker   . Smokeless tobacco: Never Used  . Alcohol Use: No   OB History   Grav Para Term Preterm Abortions TAB SAB Ect Mult Living                 Review of Systems  Constitutional: Positive for fatigue. Negative for fever and chills.  HENT: Positive for facial swelling.   Eyes: Negative for photophobia and visual disturbance.  Respiratory: Negative for cough and shortness of breath.   Cardiovascular: Negative for chest pain, palpitations and leg swelling.  Gastrointestinal: Positive for nausea. Negative for vomiting, abdominal pain, diarrhea and constipation.  Genitourinary: Negative for dysuria, frequency and flank pain.  Musculoskeletal: Positive for arthralgias. Negative for back pain, myalgias, neck pain and neck stiffness.  Skin: Positive for wound. Negative for pallor and rash.  Neurological: Negative for dizziness, syncope, weakness, light-headedness, numbness and headaches.  All other systems reviewed and are negative.    Allergies  Review of patient's allergies indicates no known allergies.  Home Medications   Current Outpatient Rx  Name  Route  Sig  Dispense  Refill  . aspirin EC 81 MG tablet   Oral   Take 1 tablet (81 mg total) by mouth daily.   90 tablet   0   . Cholecalciferol (VITAMIN D3) 1000 UNITS CAPS   Oral   Take 2,000 Units by mouth every morning.          Marland Kitchen  clopidogrel (PLAVIX) 75 MG tablet   Oral   Take 1 tablet (75 mg total) by mouth daily with breakfast.   30 tablet   1   . furosemide (LASIX) 40 MG tablet   Oral   Take 40 mg by mouth every morning.          . insulin glargine (LANTUS) 100 UNIT/ML injection   Subcutaneous   Inject 5 Units into the skin 2 (two) times daily.          . metFORMIN (GLUCOPHAGE) 1000 MG tablet   Oral   Take 1,000 mg by mouth 2 (two) times daily with a meal.         . metoprolol (LOPRESSOR) 50 MG tablet   Oral   Take 1 tablet (50 mg total) by mouth 2 (two) times daily.   60 tablet   1   .  Multiple Vitamin (MULITIVITAMIN WITH MINERALS) TABS   Oral   Take 1 tablet by mouth every morning.          . nitroGLYCERIN (NITROSTAT) 0.4 MG SL tablet   Sublingual   Place 1 tablet (0.4 mg total) under the tongue every 5 (five) minutes as needed for chest pain.   25 tablet   3   . ondansetron (ZOFRAN ODT) 8 MG disintegrating tablet   Oral   Take 1 tablet (8 mg total) by mouth every 8 (eight) hours as needed for nausea.   20 tablet   0   . pioglitazone (ACTOS) 30 MG tablet   Oral   Take 30 mg by mouth every morning.         . ramipril (ALTACE) 10 MG capsule   Oral   Take 10 mg by mouth daily at 12 noon.          . simvastatin (ZOCOR) 40 MG tablet   Oral   Take 40 mg by mouth at bedtime.         . cephALEXin (KEFLEX) 250 MG capsule   Oral   Take 1 capsule (250 mg total) by mouth 4 (four) times daily.   28 capsule   0   . EXPIRED: metFORMIN (GLUCOPHAGE) 1000 MG tablet   Oral   Take 1 tablet (1,000 mg total) by mouth 2 (two) times daily with a meal.   60 tablet   0   . traMADol (ULTRAM) 50 MG tablet   Oral   Take 1 tablet (50 mg total) by mouth every 6 (six) hours as needed.   15 tablet   0    BP 119/49  Pulse 65  Temp(Src) 97.9 F (36.6 C) (Oral)  Resp 14  Ht 5\' 9"  (1.753 m)  Wt 200 lb (90.719 kg)  BMI 29.52 kg/m2  SpO2 97% Physical Exam  Nursing note and vitals reviewed. Constitutional: She is oriented to person, place, and time. She appears well-developed and well-nourished. No distress.  HENT:  Head: Normocephalic.  Mouth/Throat: Oropharynx is clear and moist.  Left periorbital ecchymosis. No facial bony tenderness.  Eyes: EOM are normal. Pupils are equal, round, and reactive to light.  No evidence of entrapment  Neck: Normal range of motion. Neck supple.  No posterior midline cervical tenderness.  Cardiovascular: Normal rate and regular rhythm.   Pulmonary/Chest: Effort normal and breath sounds normal. No respiratory distress. She has no  wheezes. She has no rales. She exhibits no tenderness.  Abdominal: Soft. Bowel sounds are normal. She exhibits no distension and no mass. There is no  tenderness. There is no rebound and no guarding.  Musculoskeletal: Normal range of motion. She exhibits no edema and no tenderness.  Distal left radius swelling and tenderness to palpation. No snuffbox tenderness. Good distal cap refill and pulses. Right knee with full range of motion without ligamentous instability. Mild tenderness to palpation over the patella. No obvious effusion. Distal pulses intact.  Neurological: She is alert and oriented to person, place, and time.  5/5 motor in all extremities. Sensation is intact. Patient is alert and oriented x4.  Skin: Skin is warm and dry. No rash noted. No erythema.  Psychiatric: She has a normal mood and affect. Her behavior is normal.    ED Course  Procedures (including critical care time) Labs Review Labs Reviewed  CBC WITH DIFFERENTIAL - Abnormal; Notable for the following:    RBC 3.68 (*)    Hemoglobin 11.3 (*)    HCT 34.7 (*)    All other components within normal limits  COMPREHENSIVE METABOLIC PANEL - Abnormal; Notable for the following:    Glucose, Bld 129 (*)    Albumin 3.1 (*)    GFR calc non Af Amer 69 (*)    GFR calc Af Amer 80 (*)    All other components within normal limits  URINALYSIS, ROUTINE W REFLEX MICROSCOPIC - Abnormal; Notable for the following:    APPearance CLOUDY (*)    Specific Gravity, Urine 1.004 (*)    Nitrite POSITIVE (*)    Leukocytes, UA MODERATE (*)    All other components within normal limits  URINE MICROSCOPIC-ADD ON - Abnormal; Notable for the following:    Bacteria, UA MANY (*)    All other components within normal limits  URINE CULTURE  PROTIME-INR  TROPONIN I  APTT   Imaging Review Dg Wrist Complete Left  11/12/2013   CLINICAL DATA:  Fall with wrist swelling.  EXAM: LEFT WRIST - COMPLETE 3+ VIEW  COMPARISON:  Fall with wrist pain and swelling   FINDINGS: Osteopenia. No evidence of acute fracture or malalignment. 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  IMPRESSION: 1. No visible fracture. 2. Osteopenia.   Electronically Signed   By: Tiburcio Pea M.D.   On: 11/12/2013 05:02   Dg Knee 2 Views Right  11/12/2013   CLINICAL DATA:  Fall with knee pain  EXAM: RIGHT KNEE - 1-2 VIEW  COMPARISON:  None.  FINDINGS: Negative for fracture or malalignment. No joint effusion. Osteopenia. No significant joint narrowing for age.  IMPRESSION: Negative.   Electronically Signed   By: Tiburcio Pea M.D.   On: 11/12/2013 05:03   Ct Head Wo Contrast  11/12/2013   CLINICAL DATA:  Fall with head injury  EXAM: CT HEAD WITHOUT CONTRAST  TECHNIQUE: Contiguous axial images were obtained from the base of the skull through the vertex without intravenous contrast.  COMPARISON:  None currently available  FINDINGS: Skull and Sinuses:No significant abnormality.  Orbits: Bilateral cataract resection.  Brain: No evidence of acute abnormality, such as acute infarction, hemorrhage, hydrocephalus, or mass effect. There is an 8 mm fatty mass dorsal to the tectum, with anterior calcification. This could be a lipoma or dermoid - as noted previously. No free fat within the CSF.  IMPRESSION: No evidence of acute intracranial injury.   Electronically Signed   By: Tiburcio Pea M.D.   On: 11/12/2013 05:43    EKG Interpretation    Date/Time:  Tuesday November 12 2013 05:06:32 EST Ventricular Rate:  72 PR Interval:  138 QRS  Duration: 90 QT Interval:  422 QTC Calculation: 462 R Axis:   17 Text Interpretation:  Sinus rhythm Abnormal R-wave progression, early transition Baseline wander in lead(s) aVL Confirmed by Kobee Medlen  MD, Haasini Patnaude ([id number]) on 11/12/2013 7:17:35 AM            MDM   1. UTI (urinary tract infection)   2. Closed head injury, initial encounter   3. Left wrist sprain, initial encounter   4. Knee contusion, right, initial encounter    CT  head without acute findings. Patient's wrist x-ray without obvious fracture. Placed in splint and have her followup with orthopedist as an outpatient for concern for possible occult fracture. Patient been given head injury precautions and has voiced understanding. She's been advised to followup with her primary MD to assure resolution of urinary tract infection.  Loren Racer, MD 11/12/13 2956  Loren Racer, MD 11/12/13 [phone]  Vision and related well but had a brief syncopal episode while in the restroom. No new trauma. Will consult [facility] for admission. Repeat EKG.   Loren Racer, MD 11/12/13 [phone]

## 2013-11-12 NOTE — Progress Notes (Signed)
Mrs. Klassen arrived to unit by stretcher from ED, alert and oriented times 4, able to communicate needs appropriately, notified Dr. York Spaniel related to patient having sinus tachycardia with heart rate up to the 140's nonsustained, remains asymptomatic, resting in bed during tachy episodes, new order given to obtain EKG if possibile to capture arrythmia, unable to capture at this time, but will continue to follow through with MD orders, will continue to monitor

## 2013-11-12 NOTE — Evaluation (Signed)
Physical Therapy Evaluation Patient Details Name: [name] MRN: [id number] DOB: 1946/06/03 Today's Date: 11/12/2013 Time: 4098-1191 PT Time Calculation (min): 27 min  PT Assessment / Plan / Recommendation History of Present Illness  [name] is a 67 y.o. female with PMH of HTN, HPL, IDDM, CAD s/p PTCA/stent presented with fall; she tripped over bending down to fasten shoes yesterday evening around 5 PM. She struck her face on the couch. She denies any loss of consciousness. Denies focal neuro symptoms; no fever, no nausea, vomiting or diarrhea; patient had presyncopal episode while in the bathroom, also reports dizziness at home  Clinical Impression  Patient pleasant and willing to work with PT today. Needed cues and assist mainly for safety, as she tends to be impulsive in her mobility and needs further education regarding this. Can benefit from PT services acutely to work toward goals and decrease caregiver burden. Recommend HH PT in order to address deficits noted here and below in order to address functional mobility once discharged to home with spouse.    PT Assessment  Patient needs continued PT services    Follow Up Recommendations  Home health PT    Does the patient have the potential to tolerate intense rehabilitation      Barriers to Discharge        Equipment Recommendations  Rolling walker with 5" wheels (donated her walker to church)    Recommendations for Other Services     Frequency Min 3X/week    Precautions / Restrictions Precautions Precautions: Fall   Pertinent Vitals/Pain SpO2 96-99% on RA at rest and with ambulation.      Mobility  Bed Mobility Bed Mobility: Sit to Supine;Scooting to HOB Sit to Supine: 6: Modified independent (Device/Increase time);HOB flat Scooting to HOB: 6: Modified independent (Device/Increase time);With rail Details for Bed Mobility Assistance: pt not able to use L UE due to fall; fatigue due to lack of  sleep Transfers Transfers: Sit to Stand;Stand to Sit Sit to Stand: 4: Min assist;From chair/3-in-1;With upper extremity assist (x2) Stand to Sit: 4: Min guard;With upper extremity assist;To bed;To chair/3-in-1 (x2) Details for Transfer Assistance: cues needed for safety, hand placement, assist for 1st sit to stand needed for increased trunk anterior translation and trunk elevation Ambulation/Gait Ambulation/Gait Assistance: 4: Min guard Ambulation Distance (Feet): 250 Feet Assistive device: Rolling walker Ambulation/Gait Assistance Details: cues to step inside the RW, guard for safety due to pt tendency for impulsive movement/behavior Gait Pattern: Step-through pattern;Decreased stride length;Wide base of support General Gait Details: pt with increase lateral weight shifts over wide base of support; suspected decreased safety awareness Stairs: No    Exercises     PT Diagnosis: Difficulty walking;Generalized weakness  PT Problem List: Decreased strength;Decreased activity tolerance;Decreased safety awareness;Decreased mobility PT Treatment Interventions: DME instruction;Gait training;Functional mobility training;Therapeutic activities;Therapeutic exercise;Patient/family education     PT Goals(Current goals can be found in the care plan section) Acute Rehab PT Goals Patient Stated Goal: to go home PT Goal Formulation: With patient Time For Goal Achievement: 11/26/13  Visit Information  Last PT Received On: 11/12/13 Assistance Needed: +1 History of Present Illness: [name] is a 67 y.o. female with PMH of HTN, HPL, IDDM, CAD s/p PTCA/stent presented with fall; she tripped over bending down to fasten shoes yesterday evening around 5 PM. She struck her face on the couch. She denies any loss of consciousness. Denies focal neuro symptoms; no fever, no nausea, vomiting or diarrhea; patient had presyncopal episode while in  the bathroom, also reports dizziness at home       Prior  Functioning  Home Living Family/patient expects to be discharged to:: Private residence Living Arrangements: Spouse/significant other Available Help at Discharge: Family;Available 24 hours/day Type of Home: Mobile home Home Access: Stairs to enter Entrance Stairs-Number of Steps: 5 Entrance Stairs-Rails: Can reach both;Right;Left Home Layout: One level Home Equipment: Shower seat Prior Function Level of Independence: Independent Communication Communication: No difficulties Dominant Hand: Right    Cognition  Cognition Arousal/Alertness: Awake/alert Behavior During Therapy: WFL for tasks assessed/performed Overall Cognitive Status: Impaired/Different from baseline Area of Impairment: Safety/judgement General Comments: pt can be impulsive with mobility and talks over instruction at times    Extremity/Trunk Assessment Upper Extremity Assessment Upper Extremity Assessment: Overall WFL for tasks assessed;LUE deficits/detail LUE Deficits / Details: impaired and braced due to her fall Lower Extremity Assessment Lower Extremity Assessment: Generalized weakness   Balance    End of Session PT - End of Session Equipment Utilized During Treatment: Gait belt Activity Tolerance: Patient tolerated treatment well Patient left: in bed;with call bell/phone within reach Nurse Communication: Mobility status  GP     Willette Pa, SPT 11/12/2013, 3:06 PM

## 2013-11-12 NOTE — H&P (Signed)
[facility] History and Physical  [name] ZOX:[id number] DOB: 1946-11-01 DOA: 11/12/2013  Referring physician:  PCP: Johny Blamer, MD  Specialists:   Chief Complaint: fall, weakness  HPI: [name] is a 67 y.o. female with PMH of HTN, HPL, IDDM, CAD s/p PTCA/stent presented with fall; she tripped over her shoelaces beer yesterday evening around 5 PM. She struck her face on the couch. She denies any loss of consciousness. Denies focal neuro symptoms; no fever, no nausea, vomiting or diarrhea; patient had presyncopal episode while in the bathroom, also reports dizziness at home;  -IN ED she found to have UTI, due to dizziness, precynsopy triasd was called for admission    Review of Systems: The patient denies anorexia, fever, weight loss,, vision loss, decreased hearing, hoarseness, chest pain, dyspnea on exertion, emoptysis, abdominal pain, melena, hematochezia, severe indigestion/heartburn, hematuria, incontinence, genital sores, muscle weakness, suspicious skin lesions, transient blindness, depression, unusual weight change, abnormal bleeding, enlarged lymph nodes, angioedema, and breast masses.    Past Medical History  Diagnosis Date  . Diabetes mellitus   . Hypertension   . Hypercholesteremia   . CAD (coronary artery disease)     a. NSTEMI in setting of UTI and SVT 12/2012 => LHC 01/08/13: Proximal LAD 30%, ostial diagonal 30-40%, proximal RCA 95%, inferior AK, EF 45-50% => PCI: Promus DES x 2 to RCA;  b. Echo 01/05/13: EF 55-65%, grade 1 diastolic dysfunction, MAC   . SVT (supraventricular tachycardia)   . Rectal ulceration     colonoscopy 12/2012   Past Surgical History  Procedure Laterality Date  . Eye surgery      laser , cat bil  . Tubal ligation    . Kyphoplasty  12/27/2012    Procedure: KYPHOPLASTY;  Surgeon: Emilee Hero, MD;  Location: [facility] OR;  Service: Orthopedics;  Laterality: Bilateral;  T-8 kyphoplasty  . Colonoscopy Left 01/12/2013     Procedure: COLONOSCOPY;  Surgeon: Graylin Shiver, MD;  Location: [facility] ENDOSCOPY;  Service: Endoscopy;  Laterality: Left;  . Colonoscopy N/A 01/14/2013    Procedure: COLONOSCOPY;  Surgeon: Graylin Shiver, MD;  Location: [facility] ENDOSCOPY;  Service: Endoscopy;  Laterality: N/A;  Rm 2034, attemped Colon on 2-15=inadequate prep   Social History:  reports that she has never smoked. She has never used smokeless tobacco. She reports that she does not drink alcohol or use illicit drugs. Home;  where does patient live--home, ALF, SNF? and with whom if at home? Yes;  Can patient participate in ADLs?  No Known Allergies  History reviewed. No pertinent family history. h/o CAD (be sure to complete)  Prior to Admission medications   Medication Sig Start Date End Date Taking? Authorizing Provider  aspirin EC 81 MG tablet Take 1 tablet (81 mg total) by mouth daily. 04/16/13  Yes Beatrice Lecher, PA-C  Cholecalciferol (VITAMIN D3) 1000 UNITS CAPS Take 2,000 Units by mouth every morning.    Yes Historical Provider, MD  clopidogrel (PLAVIX) 75 MG tablet Take 1 tablet (75 mg total) by mouth daily with breakfast. 01/17/13  Yes Kathlen Mody, MD  furosemide (LASIX) 40 MG tablet Take 40 mg by mouth every morning.    Yes Historical Provider, MD  insulin glargine (LANTUS) 100 UNIT/ML injection Inject 5 Units into the skin 2 (two) times daily.  04/04/12 11/12/13 Yes Shanker Levora Dredge, MD  metFORMIN (GLUCOPHAGE) 1000 MG tablet Take 1,000 mg by mouth 2 (two) times daily with a meal.   Yes Historical Provider, MD  metoprolol (LOPRESSOR) 50 MG tablet Take 1 tablet (50 mg total) by mouth 2 (two) times daily. 01/17/13  Yes Kathlen Mody, MD  Multiple Vitamin (MULITIVITAMIN WITH MINERALS) TABS Take 1 tablet by mouth every morning.    Yes Historical Provider, MD  nitroGLYCERIN (NITROSTAT) 0.4 MG SL tablet Place 1 tablet (0.4 mg total) under the tongue every 5 (five) minutes as needed for chest pain. 04/16/13  Yes Scott T Alben Spittle, PA-C   ondansetron (ZOFRAN ODT) 8 MG disintegrating tablet Take 1 tablet (8 mg total) by mouth every 8 (eight) hours as needed for nausea. 12/10/12  Yes Reuben Likes, MD  pioglitazone (ACTOS) 30 MG tablet Take 30 mg by mouth every morning.   Yes Historical Provider, MD  ramipril (ALTACE) 10 MG capsule Take 10 mg by mouth daily at 12 noon.    Yes Historical Provider, MD  simvastatin (ZOCOR) 40 MG tablet Take 40 mg by mouth at bedtime.   Yes Historical Provider, MD  cephALEXin (KEFLEX) 250 MG capsule Take 1 capsule (250 mg total) by mouth 4 (four) times daily. 11/12/13   Loren Racer, MD  metFORMIN (GLUCOPHAGE) 1000 MG tablet Take 1 tablet (1,000 mg total) by mouth 2 (two) times daily with a meal. 04/04/12 07/18/13  Shanker Levora Dredge, MD  traMADol (ULTRAM) 50 MG tablet Take 1 tablet (50 mg total) by mouth every 6 (six) hours as needed. 11/12/13   Loren Racer, MD   Physical Exam: Filed Vitals:   11/12/13 0800  BP: 117/44  Pulse: 67  Temp:   Resp: 17     General:  alert  Eyes: EOM-i  ENT: no oral ulcers   Neck: supple   Cardiovascular: s1,s2 rrr  Respiratory: CTA BL  Abdomen: soft, nt, nd   Skin: no rash   Musculoskeletal: no LE edema  Psychiatric: no hallucinations   Neurologic: CN 2-12 intact   Labs on Admission:  Basic Metabolic Panel:  Recent Labs Lab 11/12/13 0426  NA 137  K 3.9  CL 102  CO2 27  GLUCOSE 129*  BUN 22  CREATININE 0.84  CALCIUM 8.9   Liver Function Tests:  Recent Labs Lab 11/12/13 0426  AST 14  ALT 12  ALKPHOS 52  BILITOT 0.3  PROT 6.4  ALBUMIN 3.1*   No results found for this basename: LIPASE, AMYLASE,  in the last 168 hours No results found for this basename: AMMONIA,  in the last 168 hours CBC:  Recent Labs Lab 11/12/13 0426  WBC 8.5  NEUTROABS 6.5  HGB 11.3*  HCT 34.7*  MCV 94.3  PLT 156   Cardiac Enzymes:  Recent Labs Lab 11/12/13 0426  TROPONINI <0.30    BNP (last 3 results) No results found for this  basename: PROBNP,  in the last 8760 hours CBG:  Recent Labs Lab 11/12/13 0750  GLUCAP 138*    Radiological Exams on Admission: Dg Wrist Complete Left  11/12/2013   CLINICAL DATA:  Fall with wrist swelling.  EXAM: LEFT WRIST - COMPLETE 3+ VIEW  COMPARISON:  Fall with wrist pain and swelling  FINDINGS: Osteopenia. No evidence of acute fracture or malalignment. 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  IMPRESSION: 1. No visible fracture. 2. Osteopenia.   Electronically Signed   By: Tiburcio Pea M.D.   On: 11/12/2013 05:02   Dg Knee 2 Views Right  11/12/2013   CLINICAL DATA:  Fall with knee pain  EXAM: RIGHT KNEE - 1-2 VIEW  COMPARISON:  None.  FINDINGS: Negative  for fracture or malalignment. No joint effusion. Osteopenia. No significant joint narrowing for age.  IMPRESSION: Negative.   Electronically Signed   By: Tiburcio Pea M.D.   On: 11/12/2013 05:03   Ct Head Wo Contrast  11/12/2013   CLINICAL DATA:  Fall with head injury  EXAM: CT HEAD WITHOUT CONTRAST  TECHNIQUE: Contiguous axial images were obtained from the base of the skull through the vertex without intravenous contrast.  COMPARISON:  None currently available  FINDINGS: Skull and Sinuses:No significant abnormality.  Orbits: Bilateral cataract resection.  Brain: No evidence of acute abnormality, such as acute infarction, hemorrhage, hydrocephalus, or mass effect. There is an 8 mm fatty mass dorsal to the tectum, with anterior calcification. This could be a lipoma or dermoid - as noted previously. No free fat within the CSF.  IMPRESSION: No evidence of acute intracranial injury.   Electronically Signed   By: Tiburcio Pea M.D.   On: 11/12/2013 05:43    EKG: Independently reviewed. NSR, no acute ST/T changes   Assessment/Plan Principal Problem:   UTI (urinary tract infection) Active Problems:   Fall   Type II or unspecified type diabetes mellitus without mention of complication, not stated as uncontrolled    Syncope   HTN (hypertension)  67 y.o. female with PMH of HTN, HPL, IDDM, CAD s/p PTCA/stent presented with fall, presyncope found to have UTI  1. Fall likely related to UTI; neuro exam no focal; CT/xray no acute findings;  -obtain PT/OT eval; treat UTI, gentle IVF; obtain chest x ray;   2. UTI; cont IV atx; f/u cultures  3. IDDM; no recent HA1C; cont insulin regimen; check a1c;   4. CAD s/p PTCA/stent; cont plavix/ASA regimen monitor facial periorbital hematoma; cont BB, statin  5. HTN  Cont ACE, hold lasix while on IVF; resume in AM   DVT prophylaxis, use SCD; hold heparin with facial hematoma; while on ASA/plavix  None;  if consultant consulted, please document name and whether formally or informally consulted  Code Status: full (must indicate code status--if unknown or must be presumed, indicate so) Family Communication: d/w patient  (indicate person spoken with, if applicable, with phone number if by telephone) Disposition Plan: home 24-48 hours (indicate anticipated LOS)  Time spent: >35 minutes  Esperanza Sheets [facility] Pager [phone]  If 7PM-7AM, please contact night-coverage [email or web address] Password [facility] 11/12/2013, 8:37 AM

## 2013-11-12 NOTE — ED Notes (Signed)
Patient warm and dry and pale upon returning from restroom. Denies feeling dizzy when transferred from chair to bed. Dr Ranae Palms in to see pt. Sinus rhythm on the monitor. Vitals assessed. Pt denies nausea or pain.

## 2013-11-12 NOTE — ED Notes (Signed)
Report called to [place] on 4 east

## 2013-11-12 NOTE — Progress Notes (Signed)
Utilization Review Completed  Terese Heier K. Benay Pomeroy, RN, BSN, MSHL, CCM  11/12/2013 2:30 PM

## 2013-11-12 NOTE — Evaluation (Signed)
Seen and agree with SPT note Adley Mazurowski Tabor Charma Mocarski, PT [phone]  

## 2013-11-12 NOTE — ED Notes (Signed)
According to EMS, patient had fallen earlier today and she hit her left wrist and her right knee.  The patient called the fire department to help her get up.  She actually tripped on her untied shoelace.  She said she started feeling weak, and she has pain in her left wrist so she decided to call EMS and they transported her here today.

## 2013-11-12 NOTE — ED Notes (Signed)
Pt taken to xray 

## 2013-11-12 NOTE — Care Management Note (Addendum)
    Page 1 of 2   11/13/2013     2:45:21 PM   CARE MANAGEMENT NOTE 11/13/2013  Patient:  [name], [name]   Account Number:  [id number]  Date Initiated:  11/12/2013  Documentation initiated by:  HUTCHINSON,CRYSTAL  Subjective/Objective Assessment:   Syncope episodes and falls   UTI (urinary tract infection)  Closed head injury, initial encounter    Left wrist sprain, initial encounter  Knee contusion, right, initial encounter     Action/Plan:   CM will continue to follow for disposition needs.   Anticipated DC Date:  11/13/2013   Anticipated DC Plan:  HOME W HOME HEALTH SERVICES      DC Planning Services  CM consult      [facility] Choice  HOME HEALTH   Choice offered to / List presented to:  C-1 Patient      DME agency  [facility]     [facility] arranged  HH-2 PT      Status of service:  Completed, signed off Medicare Important Message given?   (If response is "NO", the following Medicare IM given date fields will be blank) Date Medicare IM given:   Date Additional Medicare IM given:    Discharge Disposition:  HOME W HOME HEALTH SERVICES  Per UR Regulation:  Reviewed for med. necessity/level of care/duration of stay  If discussed at Long Length of Stay Meetings, dates discussed:    Comments:  11/13/2013 CM disposition f/u Note: Patient d/c to home 11/13/2013 [facility]/contact Lupita Leash notified of d/c date. Crystal Hutchinson RN, BSN, MSHL, CCM 11/13/2013 2:44pm   11/08/2013 CM Consult: Patient lives in here home with her husband.  Uses cane PRN. PT Recomends HH:  PT Patient has used [facility] in the past and elects Post d/c services with same. [facility]/Contact: Lupita Leash  [phone] notified Member remains on IV ABX for UTI mgmt at this time. Crystal Hutchinson RN, BSN, MSHL, CCM 11/12/2013  2:34pm

## 2013-11-13 DIAGNOSIS — T148XXA Other injury of unspecified body region, initial encounter: Secondary | ICD-10-CM | POA: Diagnosis present

## 2013-11-13 LAB — CBC
HCT: 29.9 % — ABNORMAL LOW (ref 36.0–46.0)
Hemoglobin: 10.1 g/dL — ABNORMAL LOW (ref 12.0–15.0)
MCH: 31.9 pg (ref 26.0–34.0)
RBC: 3.17 MIL/uL — ABNORMAL LOW (ref 3.87–5.11)

## 2013-11-13 LAB — URINE CULTURE: Colony Count: >=100000

## 2013-11-13 LAB — BASIC METABOLIC PANEL
BUN: 22 mg/dL (ref 6–23)
CO2: 26 mEq/L (ref 19–32)
Calcium: 8.6 mg/dL (ref 8.4–10.5)
Creatinine, Ser: 0.88 mg/dL (ref 0.50–1.10)
GFR calc Af Amer: 76 mL/min — ABNORMAL LOW (ref >90)
GFR calc non Af Amer: 66 mL/min — ABNORMAL LOW (ref >90)
Glucose, Bld: 137 mg/dL — ABNORMAL HIGH (ref 70–99)
Potassium: 3.6 mEq/L (ref 3.5–5.1)
Sodium: 139 mEq/L (ref 135–145)

## 2013-11-13 LAB — GLUCOSE, CAPILLARY: Glucose-Capillary: 107 mg/dL — ABNORMAL HIGH (ref 70–99)

## 2013-11-13 MED ORDER — TRAMADOL HCL 50 MG PO TABS: 50.0000 mg | ORAL_TABLET | Freq: Four times a day (QID) | ORAL | Status: DC | PRN

## 2013-11-13 MED ORDER — CEFUROXIME AXETIL 500 MG PO TABS
500.0000 mg | ORAL_TABLET | Freq: Two times a day (BID) | ORAL | Status: DC
  Filled 2013-11-13 (×2): qty 1

## 2013-11-13 MED ORDER — INSULIN GLARGINE 100 UNIT/ML ~~LOC~~ SOLN: 5.0000 [IU] | Freq: Two times a day (BID) | SUBCUTANEOUS | Status: DC

## 2013-11-13 MED ORDER — CEFUROXIME AXETIL 500 MG PO TABS: 500.0000 mg | ORAL_TABLET | Freq: Two times a day (BID) | ORAL | Status: DC

## 2013-11-13 NOTE — Discharge Summary (Signed)
Physician Discharge Summary  [name]:[id number] DOB: October 02, 1946 DOA: 11/12/2013  PCP: Johny Blamer, MD  Admit date: 11/12/2013 Discharge date: 11/13/2013  Recommendations for Outpatient Follow-up:  1. Physical therapy ordered at discharge. 2. Recommend followup with PCP in one week.  Discharge Diagnoses:  Principal Problem:    Fall with contusion of the right leg and left periorbital area secondary to presyncope thought to be from a UTI Active Problems:    Type II or unspecified type diabetes mellitus without mention of complication, not stated as uncontrolled    Presyncope/dizziness    HTN (hypertension)    UTI (urinary tract infection)    Contusion of right leg    Left periorbital contusion   Discharge Condition: Stable.  Diet recommendation: Low sodium, heart healthy.  History of present illness:  Patient is a 67 year old female with a PMH of hypertension, hyperlipidemia, diabetes, and coronary artery disease who was admitted to the hospital on 11/12/2013 after suffering a mechanical fall causing right lower leg and left periorbital trauma. Upon initial evaluation emergency department, she complained of dizziness and was found to have a urinary tract infection.  Hospital Course by problem:  Principal Problem:    Fall with contusion of the right leg and left periorbital area secondary to presyncope thought to be from a UTI The patient was admitted and underlying bone fractures were ruled out with radiographs. She was seen and evaluated by physical therapy with recommendations for home health PT and a rolling walker, which were ordered prior to discharge. She had some complaints of dizziness and presyncope, thought to be secondary to a urinary tract infection which is being empirically treated with Ceftin. Active Problems:   Type II or unspecified type diabetes mellitus without mention of complication, not stated as uncontrolled Excellent outpatient  control given current hemoglobin A1c of 5.8%. Resume home regimen at discharge.   Pre-Syncope / Dizziness The patient was given gentle IV fluids and treated with empiric antibiotics. No further complaints of presyncope or dizziness.   HTN (hypertension) Patient was maintained on Altace. Lasix was held and she was gently hydrated. Safe to resume Lasix at discharge.   UTI (urinary tract infection) Unfortunately, urine cultures not sent prior to initiation of therapy with Rocephin. Patient will be sent home on a 4 day course of Ceftin to complete a total course of therapy of 5 days.   Contusion of right leg and left periorbital area Patient was instructed to keep her right leg elevated and to apply ice for swelling. Her left periorbital area is bruised but no evidence of underlying skull fracture or trauma to the eye.  Procedures:  None.  Consultations:  Physical therapy.  Discharge Exam: Filed Vitals:   11/13/13 0502  BP: 126/60  Pulse: 69  Temp: 97.9 F (36.6 C)  Resp: 18   Filed Vitals:   11/12/13 1300 11/12/13 1729 11/12/13 2104 11/13/13 0502  BP: 116/59 124/48 134/53 126/60  Pulse: 77 76 71 69  Temp: 98 F (36.7 C) 98 F (36.7 C) 98.1 F (36.7 C) 97.9 F (36.6 C)  TempSrc: Oral Oral Oral Oral  Resp: 18 18 18 18   Height:      Weight:    95 kg (209 lb 7 oz)  SpO2: 97% 98% 93% 94%    Gen:  NAD Head: Left periorbital ecchymosis Cardiovascular:  RRR, No M/R/G Respiratory: Lungs CTAB Gastrointestinal: Abdomen soft, NT/ND with normal active bowel sounds. Extremities: Right lower extremity swollen with extensive bruising  Discharge Instructions      Discharge Orders   Future Orders Complete By Expires   Face-to-face encounter (required for Medicare/Medicaid patients)  As directed    Comments:     I RAMA,CHRISTINA certify that this patient is under my care and that I, or a nurse practitioner or physician's assistant working with me, had a face-to-face encounter  that meets the physician face-to-face encounter requirements with this patient on 11/13/2013. The encounter with the patient was in whole, or in part for the following medical condition(s) which is the primary reason for home health care (List medical condition): Fall at home, needs PT per PT recommendations.   Questions:     The encounter with the patient was in whole, or in part, for the following medical condition, which is the primary reason for home health care:  Fall, deconditioning   I certify that, based on my findings, the following services are medically necessary home health services:  Physical therapy   My clinical findings support the need for the above services:  Unable to leave home safely without assistance and/or assistive device   Further, I certify that my clinical findings support that this patient is homebound due to:  Unable to leave home safely without assistance   Reason for Medically Necessary Home Health Services:  Therapy- Investment banker, operational, Patent examiner   Therapy- Instruction on use of Assistive Device for Ambulation on all Surfaces   Therapy- Home Adaptation to Facilitate Safety   Home Health  As directed    Questions:     To provide the following care/treatments:  PT   Walker rolling  As directed    Comments:     5 inch wheels       Medication List         aspirin EC 81 MG tablet  Take 1 tablet (81 mg total) by mouth daily.     cefUROXime 500 MG tablet  Commonly known as:  CEFTIN  Take 1 tablet (500 mg total) by mouth 2 (two) times daily with a meal.     cephALEXin 250 MG capsule  Commonly known as:  KEFLEX  Take 1 capsule (250 mg total) by mouth 4 (four) times daily.     clopidogrel 75 MG tablet  Commonly known as:  PLAVIX  Take 1 tablet (75 mg total) by mouth daily with breakfast.     furosemide 40 MG tablet  Commonly known as:  LASIX  Take 40 mg by mouth every morning.     insulin glargine 100 UNIT/ML injection  Commonly  known as:  LANTUS  Inject 0.05 mLs (5 Units total) into the skin 2 (two) times daily.     metFORMIN 1000 MG tablet  Commonly known as:  GLUCOPHAGE  Take 1 tablet (1,000 mg total) by mouth 2 (two) times daily with a meal.     metFORMIN 1000 MG tablet  Commonly known as:  GLUCOPHAGE  Take 1,000 mg by mouth 2 (two) times daily with a meal.     metoprolol 50 MG tablet  Commonly known as:  LOPRESSOR  Take 1 tablet (50 mg total) by mouth 2 (two) times daily.     multivitamin with minerals Tabs tablet  Take 1 tablet by mouth every morning.     nitroGLYCERIN 0.4 MG SL tablet  Commonly known as:  NITROSTAT  Place 1 tablet (0.4 mg total) under the tongue every 5 (five) minutes as needed for chest pain.  ondansetron 8 MG disintegrating tablet  Commonly known as:  ZOFRAN ODT  Take 1 tablet (8 mg total) by mouth every 8 (eight) hours as needed for nausea.     pioglitazone 30 MG tablet  Commonly known as:  ACTOS  Take 30 mg by mouth every morning.     ramipril 10 MG capsule  Commonly known as:  ALTACE  Take 10 mg by mouth daily at 12 noon.     simvastatin 40 MG tablet  Commonly known as:  ZOCOR  Take 40 mg by mouth at bedtime.     traMADol 50 MG tablet  Commonly known as:  ULTRAM  Take 1 tablet (50 mg total) by mouth every 6 (six) hours as needed.     Vitamin D3 1000 UNITS Caps  Take 2,000 Units by mouth every morning.       Follow-up Information   Call Johny Blamer, MD. (As needed)    Specialty:  Family Medicine   Contact information:   [address], [address] [place] [address] [phone]       Follow up with Karen Chafe, MD In 1 week.   Specialty:  Orthopedic Surgery   Contact information:   [address] [place] [address] [phone]       Follow up with [facility] EMERGENCY DEPARTMENT. (As needed if symptoms worsen)    Specialty:  Emergency Medicine   Contact information:   [address] [id number] [place] [address] [phone]      Follow up with [facility]. (PT)    Contact information:   [address] [place] [address] [phone]       Follow up with Johny Blamer, MD. Schedule an appointment as soon as possible for a visit in 1 week.   Specialty:  Family Medicine   Contact information:   [address] [place] [address] [phone]        The results of significant diagnostics from this hospitalization (including imaging, microbiology, ancillary and laboratory) are listed below for reference.    Significant Diagnostic Studies: Dg Chest 1 View  11/12/2013   CLINICAL DATA:  Shortness of breath.  EXAM: CHEST - 1 VIEW  COMPARISON:  01/04/2013.  FINDINGS: Mediastinum and hilar structures are normal. Subsegmental atelectasis and/or scarring noted lung bases. Similar findings noted on prior study. Cardiomegaly with normal pulmonary vascularity. No pleural effusion or pneumothorax. Degenerative changes thoracic spine. Prior kyphoplasty.  IMPRESSION: 1. Stable bibasilar subsegmental atelectasis versus scarring. 2. Stable cardiomegaly, no CHF. 3. Prior thoracic  kyphoplasty.   Electronically Signed   By: Maisie Fus  Register   On: 11/12/2013 11:41   Dg Wrist Complete Left  11/12/2013   CLINICAL DATA:  Fall with wrist swelling.  EXAM: LEFT WRIST - COMPLETE 3+ VIEW  COMPARISON:  Fall with wrist pain and swelling  FINDINGS: Osteopenia. No evidence of acute fracture or malalignment. 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  IMPRESSION: 1. No visible fracture. 2. Osteopenia.   Electronically Signed   By: Tiburcio Pea M.D.   On: 11/12/2013 05:02   Dg Knee 2 Views Right  11/12/2013   CLINICAL DATA:  Fall with knee pain  EXAM: RIGHT KNEE - 1-2 VIEW  COMPARISON:  None.  FINDINGS: Negative for fracture or malalignment. No joint effusion. Osteopenia. No significant joint narrowing for age.  IMPRESSION:  Negative.   Electronically Signed   By: Audry Riles.D.  On: 11/12/2013 05:03   Ct Head Wo Contrast  11/12/2013   CLINICAL DATA:  Fall with head injury  EXAM: CT HEAD WITHOUT CONTRAST  TECHNIQUE: Contiguous axial images were obtained from the base of the skull through the vertex without intravenous contrast.  COMPARISON:  None currently available  FINDINGS: Skull and Sinuses:No significant abnormality.  Orbits: Bilateral cataract resection.  Brain: No evidence of acute abnormality, such as acute infarction, hemorrhage, hydrocephalus, or mass effect. There is an 8 mm fatty mass dorsal to the tectum, with anterior calcification. This could be a lipoma or dermoid - as noted previously. No free fat within the CSF.  IMPRESSION: No evidence of acute intracranial injury.   Electronically Signed   By: Tiburcio Pea M.D.   On: 11/12/2013 05:43    Labs:  Basic Metabolic Panel:  Recent Labs Lab 11/12/13 0426 11/13/13 0350  NA 137 139  K 3.9 3.6  CL 102 105  CO2 27 26  GLUCOSE 129* 137*  BUN 22 22  CREATININE 0.84 0.88  CALCIUM 8.9 8.6   GFR Estimated Creatinine Clearance: 74 ml/min (by C-G formula based on Cr of 0.88). Liver Function Tests:  Recent Labs Lab 11/12/13 0426  AST 14  ALT 12  ALKPHOS 52  BILITOT 0.3  PROT 6.4  ALBUMIN 3.1*   Coagulation profile  Recent Labs Lab 11/12/13 0426  INR 1.06    CBC:  Recent Labs Lab 11/12/13 0426 11/13/13 0350  WBC 8.5 7.3  NEUTROABS 6.5  --   HGB 11.3* 10.1*  HCT 34.7* 29.9*  MCV 94.3 94.3  PLT 156 136*   Cardiac Enzymes:  Recent Labs Lab 11/12/13 0426  TROPONINI <0.30   CBG:  Recent Labs Lab 11/12/13 0750 11/12/13 1111 11/12/13 1603 11/12/13 2122 11/13/13 0602  GLUCAP 138* 175* 135* 117* 107*   Hgb A1c  Recent Labs  11/12/13 1040  HGBA1C 5.8*    Time coordinating discharge: 35 minutes.  Signed:  RAMA,CHRISTINA  Pager [phone] [facility] 11/13/2013, 9:59 AM

## 2013-11-13 NOTE — Progress Notes (Signed)
DC IV, DC Tele, DC Home. Discharge instructions and home medications discussed with patient and patient's husband. Patient and husband denied any questions or concerns at this time. Rolling walker has been given and instructions on how to operate has been stated as well.Patient leaving unit via wheelchair and appears in no acute distress.

## 2013-11-13 NOTE — Progress Notes (Signed)
Scheduled IV antibiotic due. IV is occluded. MD notified. New orders given. IV antibiotic discontinued until follow up with MD.

## 2014-01-20 ENCOUNTER — Ambulatory Visit (INDEPENDENT_AMBULATORY_CARE_PROVIDER_SITE_OTHER): Payer: Medicare Other | Admitting: Physician Assistant

## 2014-01-20 ENCOUNTER — Encounter: Payer: Self-pay | Admitting: Physician Assistant

## 2014-01-20 ENCOUNTER — Ambulatory Visit: Payer: Medicare Other | Admitting: Cardiology

## 2014-01-20 VITALS — BP 120/78 | HR 69 | Ht 69.0 in | Wt 211.0 lb

## 2014-01-20 DIAGNOSIS — I2510 Atherosclerotic heart disease of native coronary artery without angina pectoris: Secondary | ICD-10-CM

## 2014-01-20 DIAGNOSIS — E785 Hyperlipidemia, unspecified: Secondary | ICD-10-CM

## 2014-01-20 DIAGNOSIS — I252 Old myocardial infarction: Secondary | ICD-10-CM

## 2014-01-20 DIAGNOSIS — I10 Essential (primary) hypertension: Secondary | ICD-10-CM

## 2014-01-20 DIAGNOSIS — I498 Other specified cardiac arrhythmias: Secondary | ICD-10-CM

## 2014-01-20 DIAGNOSIS — I471 Supraventricular tachycardia: Secondary | ICD-10-CM

## 2014-01-20 NOTE — Progress Notes (Signed)
[address], [place], [place]  [address] Phone: [phone] Fax:  [phone]  Date:  01/20/2014   ID:  [name], DOB November 28, 1946, MRN [id number]  PCP:  Shirline Frees, MD  Cardiologist:  Dr. Peter Martinique     History of Present Illness: [name] is a 68 y.o. female with a hx of CAD, HTN, HL, DM2. She was admitted to the hospital 12/2012 with a non-STEMI in the setting of SVT and urinary tract infection. Echo 01/05/13: EF 69-67%, grade 1 diastolic dysfunction, MAC. LHC 01/08/13: Proximal LAD 30%, ostial diagonal 30-40%, proximal RCA 95%, inferior AK, EF 45-50%.  PCI 01/15/13: Promus premier DES x2 to the RCA.  She was maintained on beta blocker for her SVT.  Last seen by Dr. Peter Martinique in 06/2013.  Overall, doing very well since last seen.  The patient denies chest pain, shortness of breath, syncope, orthopnea, PND or significant pedal edema.   Recent Labs: 11/12/2013: ALT 12  11/13/2013: Creatinine 0.88; Hemoglobin 10.1*; Potassium 3.6   Wt Readings from Last 3 Encounters:  01/20/14 211 lb (95.709 kg)  11/13/13 209 lb 7 oz (95 kg)  07/18/13 197 lb 12.8 oz (89.721 kg)     Past Medical History  Diagnosis Date  . Diabetes mellitus   . Hypertension   . Hypercholesteremia   . CAD (coronary artery disease)     a. NSTEMI in setting of UTI and SVT 12/2012 => LHC 01/08/13: Proximal LAD 30%, ostial diagonal 30-40%, proximal RCA 95%, inferior AK, EF 45-50% => PCI: Promus DES x 2 to RCA;  b. Echo 01/05/13: EF 89-38%, grade 1 diastolic dysfunction, MAC   . SVT (supraventricular tachycardia)   . Rectal ulceration     colonoscopy 12/2012    Current Outpatient Prescriptions  Medication Sig Dispense Refill  . aspirin EC 81 MG tablet Take 1 tablet (81 mg total) by mouth daily.  90 tablet  0  . Cholecalciferol (VITAMIN D3) 1000 UNITS CAPS Take 2,000 Units by mouth every morning.       . clopidogrel (PLAVIX) 75 MG tablet Take 1 tablet (75 mg total) by mouth daily with  breakfast.  30 tablet  1  . furosemide (LASIX) 40 MG tablet Take 40 mg by mouth every morning.       . insulin glargine (LANTUS) 100 UNIT/ML injection Inject 0.05 mLs (5 Units total) into the skin 2 (two) times daily.  10 mL  12  . metFORMIN (GLUCOPHAGE) 1000 MG tablet Take 1,000 mg by mouth 2 (two) times daily with a meal.      . metoprolol (LOPRESSOR) 50 MG tablet Take 1 tablet (50 mg total) by mouth 2 (two) times daily.  60 tablet  1  . Multiple Vitamin (MULITIVITAMIN WITH MINERALS) TABS Take 1 tablet by mouth every morning.       . nitroGLYCERIN (NITROSTAT) 0.4 MG SL tablet Place 1 tablet (0.4 mg total) under the tongue every 5 (five) minutes as needed for chest pain.  25 tablet  3  . ondansetron (ZOFRAN ODT) 8 MG disintegrating tablet Take 1 tablet (8 mg total) by mouth every 8 (eight) hours as needed for nausea.  20 tablet  0  . pioglitazone (ACTOS) 30 MG tablet Take 30 mg by mouth every morning.      . ramipril (ALTACE) 10 MG capsule Take 10 mg by mouth daily at 12 noon.       . simvastatin (ZOCOR) 40 MG tablet Take 40  mg by mouth at bedtime.       No current facility-administered medications for this visit.    Allergies:   Review of patient's allergies indicates no known allergies.   Social History:  The patient  reports that she has never smoked. She has never used smokeless tobacco. She reports that she does not drink alcohol or use illicit drugs.   Family History:  The patient's family history is not on file.   ROS:  Please see the history of present illness.      All other systems reviewed and negative.   PHYSICAL EXAM: VS:  BP 120/78  Pulse 69  Ht 5\' 9"  (1.753 m)  Wt 211 lb (95.709 kg)  BMI 31.15 kg/m2 Well nourished, well developed, in no acute distress HEENT: normal Neck: no JVD Cardiac:  normal S1, S2; RRR; no murmur Lungs:  clear to auscultation bilaterally, no wheezing, rhonchi or rales Abd: soft, nontender, no hepatomegaly Ext: no edema Skin: warm and  dry Neuro:  CNs 2-12 intact, no focal abnormalities noted  EKG:  NSR, HR 69, normal axis, no ST changes     ASSESSMENT AND PLAN:  1. CAD, s/p Prior NSTEMI:  Doing well.  She is 1 year out from her NSTEMI treated with DES x 2 to the RCA.  I will review with Dr. Peter Martinique to see if we can d/c her Plavix now.  She will remain on ASA, statin.   2. Hypertension:  Controlled.   3. Hyperlipidemia:  Managed by PCP.  4. SVT:  No apparent occurrence.  Continue beta blocker. 5. Disposition:  F/u with Dr. Peter Martinique in 6 mos.   Signed, Richardson Dopp, PA-C  01/20/2014 12:34 PM

## 2014-01-20 NOTE — Patient Instructions (Signed)
Your physician assistant wants you to follow-up in: 6 months with Dr. Martinique.  You will receive a reminder letter in the mail two months in advance. If you don't receive a letter, please call our office to schedule the follow-up appointment.  Your physician assistant recommends that you continue on your current medications as directed. Please refer to the Current Medication list given to you today.

## 2014-01-21 ENCOUNTER — Telehealth: Payer: Self-pay | Admitting: Physician Assistant

## 2014-01-21 NOTE — Telephone Encounter (Signed)
Please tell patient I reviewed everything with Dr. Peter Martinique. She may stop Plavix once she finishes her current bottle. She should continue taking Aspirin. Richardson Dopp, PA-C   01/21/2014 10:10 AM

## 2014-01-21 NOTE — Progress Notes (Signed)
I reviewed with Dr. Peter Martinique.  It is ok to stop Plavix. Will d/c Plavix once she completes her current Rx. Continue ASA indefinitely. Signed,  Richardson Dopp, PA-C   01/21/2014 10:12 AM

## 2014-01-22 ENCOUNTER — Telehealth: Payer: Self-pay | Admitting: Physician Assistant

## 2014-01-22 NOTE — Telephone Encounter (Signed)
Spoke to patient Melissa Dopp PA spoke to [place] he advised ok to stop Plavix after you finish current bottle.Advised to continue aspirin.

## 2014-01-22 NOTE — Telephone Encounter (Signed)
cbpt about stopping Plavix and she said someone already called her today and she was all set.Pt said thank you.

## 2014-01-22 NOTE — Telephone Encounter (Signed)
New message     Pt saw Richardson Dopp on Monday.  He was going to stop a medication.  She is going to the drugstore to have her medications refilled and did not want to have the stopped medicine refilled

## 2014-05-13 ENCOUNTER — Telehealth: Payer: Self-pay | Admitting: Cardiology

## 2014-05-13 NOTE — Telephone Encounter (Signed)
Spoke with patient who states she does not want to go to the [place] office when Dr. Martinique moves.  I advised patient that Dr. Martinique and Malachy Mood are out of the office until next week and that I will send message to them for follow-up next week.  Patient states she does not need to speak to anyone, she just wants to see a doctor that will remain at our [facility] Location.  I asked if patient has a preference of providers and patient states she is comfortable with anyone in our office.  Patient is scheduled with Dr. Marlou Porch on 8/21 due to availability of appointments.  Patient verbalized understanding and gratitude.

## 2014-05-13 NOTE — Telephone Encounter (Signed)
New problem   Pt want to speak to nurse concerning she doesn't want to go to [facility] to see doc and she want Dr Martinique to refer her to a physician her. Please call pt.

## 2014-07-05 DIAGNOSIS — D3130 Benign neoplasm of unspecified choroid: Secondary | ICD-10-CM | POA: Insufficient documentation

## 2014-07-18 ENCOUNTER — Ambulatory Visit: Payer: Medicare Other | Admitting: Cardiology

## 2014-07-21 ENCOUNTER — Ambulatory Visit: Payer: Medicare Other | Admitting: Cardiology

## 2014-08-12 IMAGING — CR DG LUMBAR SPINE COMPLETE 4+V
5 series · 5 of 5 positions shown · non-contrast
Comparison: None.

CLINICAL DATA: Back pain.  Left flank pain.

LUMBAR SPINE - COMPLETE 4+ VIEW

[view not recorded (1 of 5)]
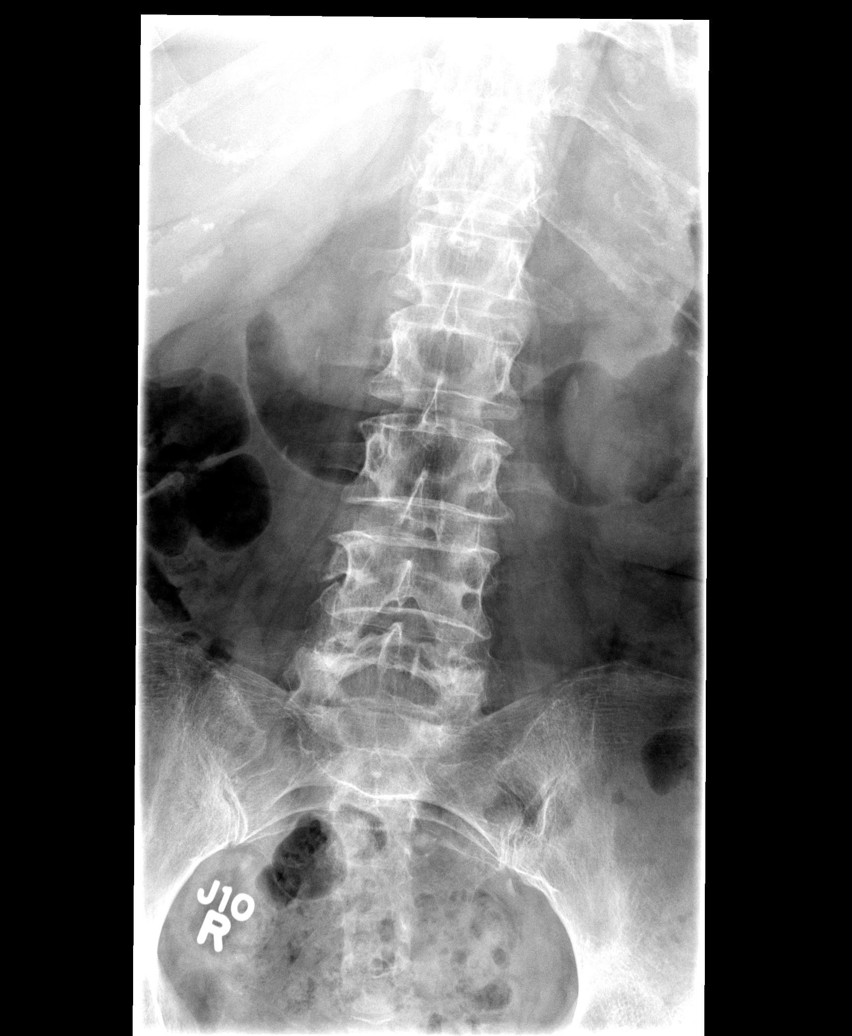

[view not recorded (2 of 5)]
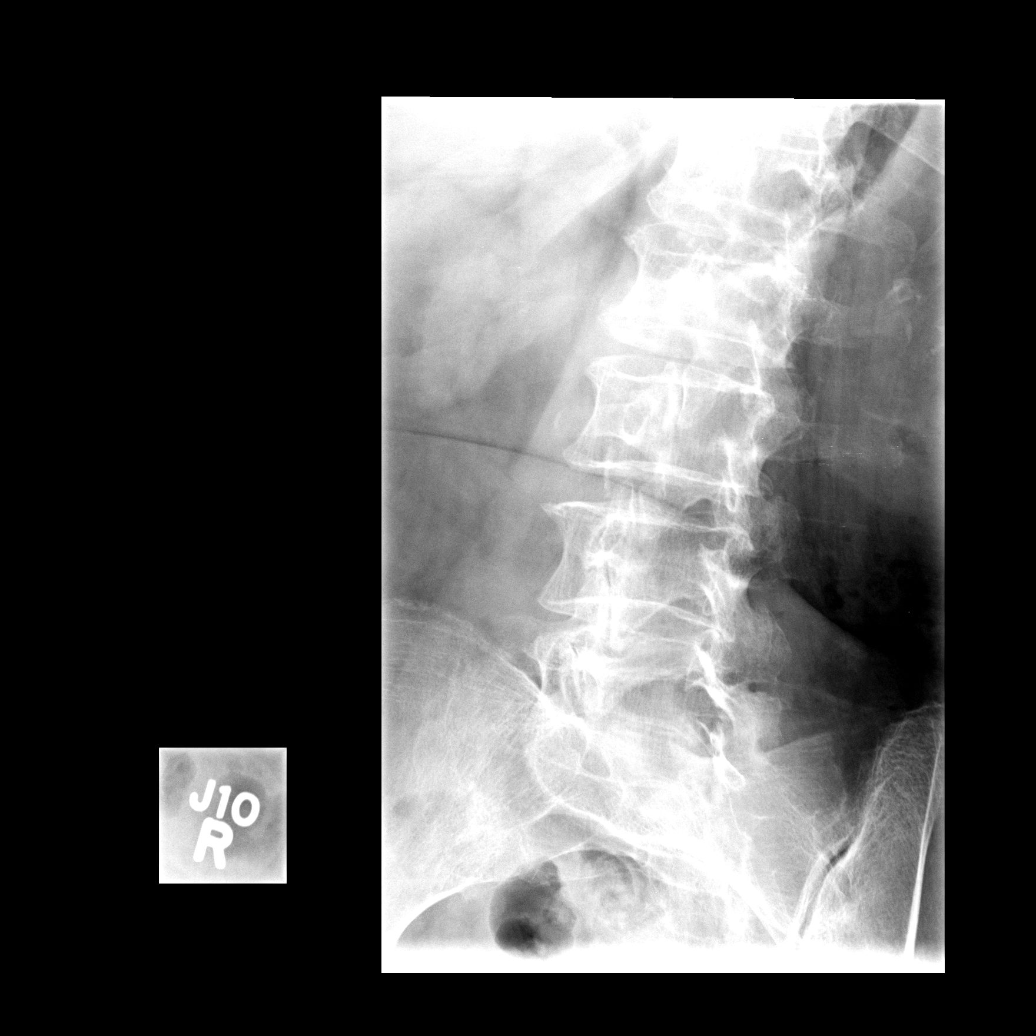

[view not recorded (3 of 5)]
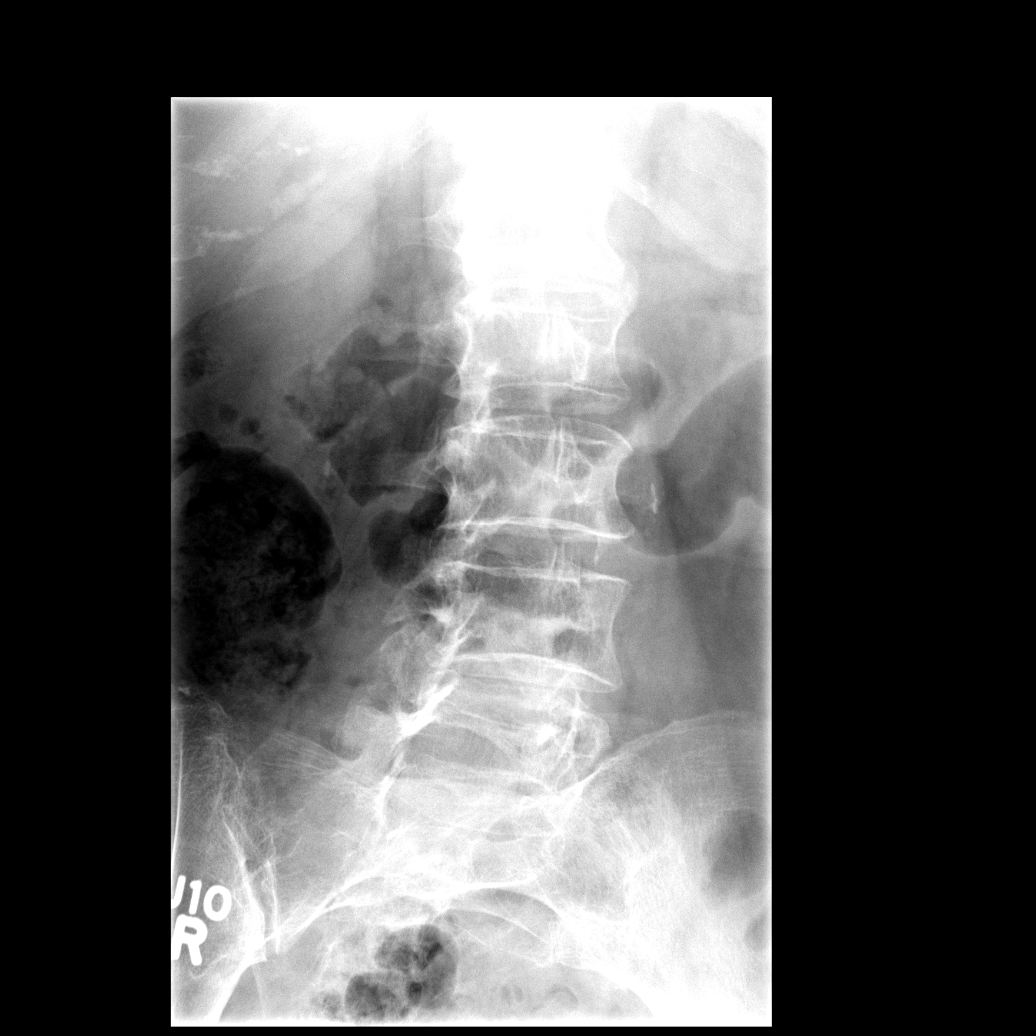

[view not recorded (4 of 5)]
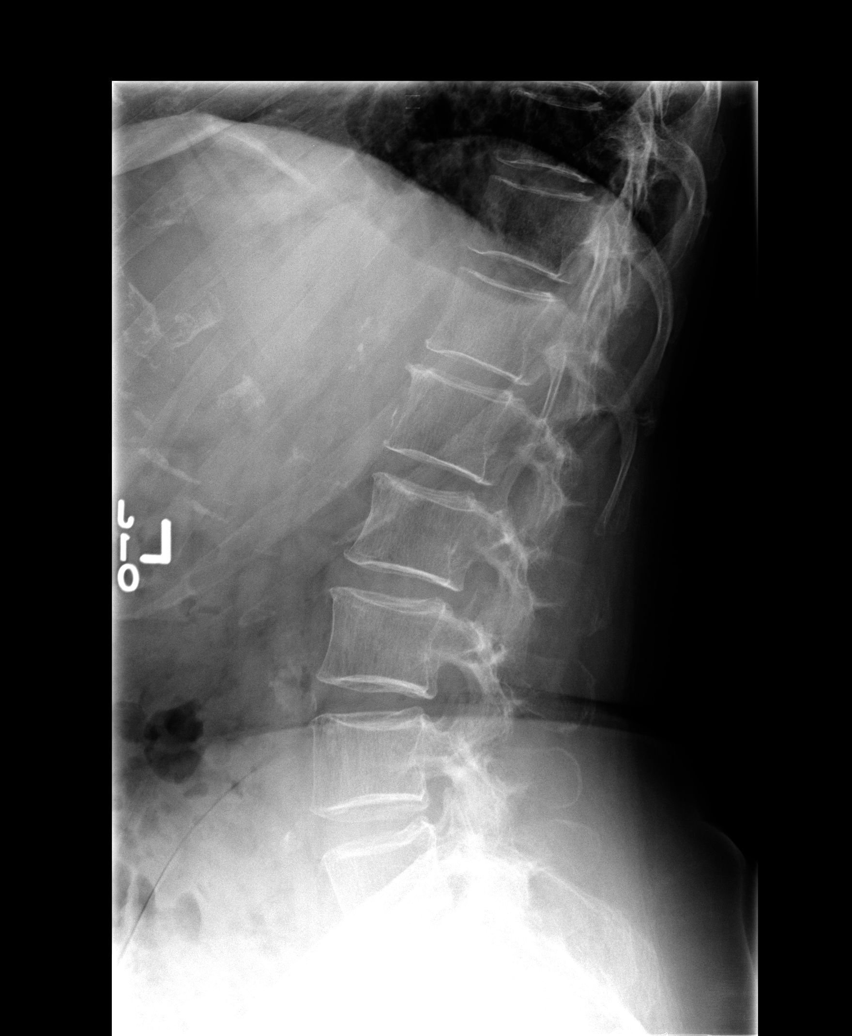

[view not recorded (5 of 5)]
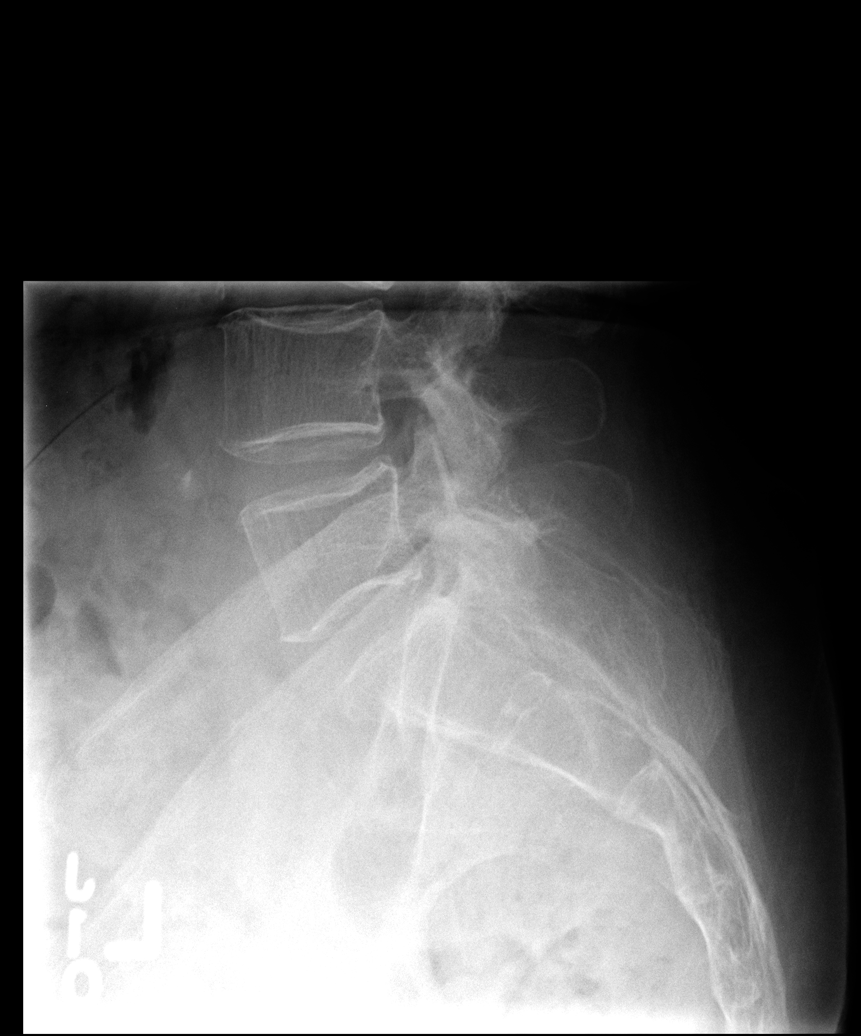

[5 of 5 positions shown; findings below may reference images not displayed]

FINDINGS: No vertebral compression deformity.  Anatomic alignment.
Mild narrowing of the L3-4 and L4-5 disc.  Mild  facet arthropathy
at L4-5 and L5-S1.  No pars defect.  Mild vascular calcifications.
IMPRESSION: No acute bony pathology.  Mild degenerative change.

## 2014-11-06 ENCOUNTER — Encounter (HOSPITAL_COMMUNITY): Payer: Self-pay | Admitting: Cardiovascular Disease

## 2014-11-14 ENCOUNTER — Emergency Department (HOSPITAL_COMMUNITY): Payer: Medicare Other

## 2014-11-14 ENCOUNTER — Inpatient Hospital Stay (HOSPITAL_COMMUNITY)
Admission: EM | Admit: 2014-11-14 | Discharge: 2014-11-18 | DRG: 481 | Disposition: A | Discharge status: Skilled Nursing Facility | Payer: Medicare Other | Attending: Internal Medicine | Admitting: Internal Medicine

## 2014-11-14 ENCOUNTER — Encounter (HOSPITAL_COMMUNITY): Payer: Self-pay | Admitting: Emergency Medicine

## 2014-11-14 DIAGNOSIS — I471 Supraventricular tachycardia, unspecified: Secondary | ICD-10-CM | POA: Diagnosis present

## 2014-11-14 DIAGNOSIS — Z7982 Long term (current) use of aspirin: Secondary | ICD-10-CM

## 2014-11-14 DIAGNOSIS — E780 Pure hypercholesterolemia: Secondary | ICD-10-CM | POA: Diagnosis present

## 2014-11-14 DIAGNOSIS — M8580 Other specified disorders of bone density and structure, unspecified site: Secondary | ICD-10-CM | POA: Diagnosis present

## 2014-11-14 DIAGNOSIS — I2510 Atherosclerotic heart disease of native coronary artery without angina pectoris: Secondary | ICD-10-CM | POA: Diagnosis present

## 2014-11-14 DIAGNOSIS — Y9289 Other specified places as the place of occurrence of the external cause: Secondary | ICD-10-CM | POA: Diagnosis not present

## 2014-11-14 DIAGNOSIS — Z9861 Coronary angioplasty status: Secondary | ICD-10-CM

## 2014-11-14 DIAGNOSIS — Z01811 Encounter for preprocedural respiratory examination: Secondary | ICD-10-CM

## 2014-11-14 DIAGNOSIS — Y9389 Activity, other specified: Secondary | ICD-10-CM

## 2014-11-14 DIAGNOSIS — S72142A Displaced intertrochanteric fracture of left femur, initial encounter for closed fracture: Secondary | ICD-10-CM | POA: Diagnosis present

## 2014-11-14 DIAGNOSIS — E119 Type 2 diabetes mellitus without complications: Secondary | ICD-10-CM | POA: Diagnosis present

## 2014-11-14 DIAGNOSIS — E785 Hyperlipidemia, unspecified: Secondary | ICD-10-CM | POA: Diagnosis present

## 2014-11-14 DIAGNOSIS — Z9851 Tubal ligation status: Secondary | ICD-10-CM

## 2014-11-14 DIAGNOSIS — I10 Essential (primary) hypertension: Secondary | ICD-10-CM | POA: Diagnosis present

## 2014-11-14 DIAGNOSIS — N390 Urinary tract infection, site not specified: Secondary | ICD-10-CM | POA: Diagnosis present

## 2014-11-14 DIAGNOSIS — W010XXA Fall on same level from slipping, tripping and stumbling without subsequent striking against object, initial encounter: Secondary | ICD-10-CM | POA: Diagnosis present

## 2014-11-14 DIAGNOSIS — Z794 Long term (current) use of insulin: Secondary | ICD-10-CM | POA: Diagnosis not present

## 2014-11-14 DIAGNOSIS — Z955 Presence of coronary angioplasty implant and graft: Secondary | ICD-10-CM

## 2014-11-14 DIAGNOSIS — S72002A Fracture of unspecified part of neck of left femur, initial encounter for closed fracture: Secondary | ICD-10-CM | POA: Diagnosis present

## 2014-11-14 LAB — CBC WITH DIFFERENTIAL/PLATELET
BASOS PCT: 0 % (ref 0–1)
Basophils Absolute: 0 10*3/uL (ref 0.0–0.1)
EOS ABS: 0.2 10*3/uL (ref 0.0–0.7)
Eosinophils Relative: 2 % (ref 0–5)
HCT: 39.3 % (ref 36.0–46.0)
Hemoglobin: 12.8 g/dL (ref 12.0–15.0)
Lymphocytes Relative: 19 % (ref 12–46)
Lymphs Abs: 2.4 10*3/uL (ref 0.7–4.0)
MCH: 30.3 pg (ref 26.0–34.0)
MCHC: 32.6 g/dL (ref 30.0–36.0)
MCV: 93.1 fL (ref 78.0–100.0)
Monocytes Absolute: 0.9 10*3/uL (ref 0.1–1.0)
Monocytes Relative: 7 % (ref 3–12)
NEUTROS PCT: 72 % (ref 43–77)
Neutro Abs: 9 10*3/uL — ABNORMAL HIGH (ref 1.7–7.7)
Platelets: 203 10*3/uL (ref 150–400)
RBC: 4.22 MIL/uL (ref 3.87–5.11)
RDW: 13.2 % (ref 11.5–15.5)
WBC: 12.5 10*3/uL — ABNORMAL HIGH (ref 4.0–10.5)

## 2014-11-14 LAB — BASIC METABOLIC PANEL
Anion gap: 13 (ref 5–15)
BUN: 22 mg/dL (ref 6–23)
CALCIUM: 9.5 mg/dL (ref 8.4–10.5)
CO2: 27 mEq/L (ref 19–32)
Chloride: 95 mEq/L — ABNORMAL LOW (ref 96–112)
Creatinine, Ser: 0.84 mg/dL (ref 0.50–1.10)
GFR, EST AFRICAN AMERICAN: 80 mL/min — AB (ref >90)
GFR, EST NON AFRICAN AMERICAN: 69 mL/min — AB (ref >90)
Glucose, Bld: 110 mg/dL — ABNORMAL HIGH (ref 70–99)
Potassium: 4.1 mEq/L (ref 3.7–5.3)
SODIUM: 135 meq/L — AB (ref 137–147)

## 2014-11-14 LAB — ALBUMIN: Albumin: 3.2 g/dL — ABNORMAL LOW (ref 3.5–5.2)

## 2014-11-14 LAB — GLUCOSE, CAPILLARY: GLUCOSE-CAPILLARY: 133 mg/dL — AB (ref 70–99)

## 2014-11-14 MED ORDER — SIMVASTATIN 40 MG PO TABS
40.0000 mg | ORAL_TABLET | Freq: Every day | ORAL | Status: DC
  Administered 2014-11-14 – 2014-11-16 (×3): 40 mg via ORAL
  Filled 2014-11-14 (×4): qty 1

## 2014-11-14 MED ORDER — DOCUSATE SODIUM 100 MG PO CAPS
100.0000 mg | ORAL_CAPSULE | Freq: Two times a day (BID) | ORAL | Status: DC
Start: 2014-11-14 — End: 2014-11-15
  Administered 2014-11-14: 100 mg via ORAL
  Filled 2014-11-14 (×3): qty 1

## 2014-11-14 MED ORDER — HYDROMORPHONE HCL 1 MG/ML IJ SOLN: 1.0000 mg | INTRAMUSCULAR | Status: DC | PRN

## 2014-11-14 MED ORDER — METHOCARBAMOL 500 MG PO TABS: 500.0000 mg | ORAL_TABLET | Freq: Four times a day (QID) | ORAL | Status: DC | PRN

## 2014-11-14 MED ORDER — ONDANSETRON HCL 4 MG/2ML IJ SOLN
4.0000 mg | Freq: Once | INTRAMUSCULAR | Status: AC
  Administered 2014-11-14: 4 mg via INTRAVENOUS
  Filled 2014-11-14: qty 2

## 2014-11-14 MED ORDER — FUROSEMIDE 40 MG PO TABS
40.0000 mg | ORAL_TABLET | Freq: Every day | ORAL | Status: DC
  Administered 2014-11-16 – 2014-11-18 (×3): 40 mg via ORAL
  Filled 2014-11-14 (×5): qty 1

## 2014-11-14 MED ORDER — METHOCARBAMOL 1000 MG/10ML IJ SOLN
500.0000 mg | Freq: Four times a day (QID) | INTRAMUSCULAR | Status: DC | PRN
  Filled 2014-11-14: qty 5

## 2014-11-14 MED ORDER — ONDANSETRON HCL 4 MG/2ML IJ SOLN
4.0000 mg | Freq: Three times a day (TID) | INTRAMUSCULAR | Status: DC | PRN
  Administered 2014-11-15 (×2): 4 mg via INTRAVENOUS
  Filled 2014-11-14 (×3): qty 2

## 2014-11-14 MED ORDER — HYDROCODONE-ACETAMINOPHEN 5-325 MG PO TABS: 1.0000 | ORAL_TABLET | Freq: Four times a day (QID) | ORAL | Status: DC | PRN

## 2014-11-14 MED ORDER — HYDROCODONE-ACETAMINOPHEN 5-325 MG PO TABS
1.0000 | ORAL_TABLET | ORAL | Status: DC | PRN
  Administered 2014-11-14: 2 via ORAL
  Filled 2014-11-14: qty 2

## 2014-11-14 MED ORDER — HYDROMORPHONE HCL 1 MG/ML IJ SOLN
1.0000 mg | Freq: Once | INTRAMUSCULAR | Status: AC
  Administered 2014-11-14: 1 mg via INTRAVENOUS
  Filled 2014-11-14: qty 1

## 2014-11-14 MED ORDER — METOPROLOL TARTRATE 50 MG PO TABS
50.0000 mg | ORAL_TABLET | Freq: Two times a day (BID) | ORAL | Status: DC
  Administered 2014-11-14 – 2014-11-15 (×2): 50 mg via ORAL
  Filled 2014-11-14 (×5): qty 1

## 2014-11-14 MED ORDER — HYDROMORPHONE HCL 1 MG/ML IJ SOLN
1.0000 mg | INTRAMUSCULAR | Status: DC | PRN
  Administered 2014-11-14 – 2014-11-15 (×2): 1 mg via INTRAVENOUS
  Filled 2014-11-14 (×2): qty 1

## 2014-11-14 MED ORDER — INSULIN ASPART 100 UNIT/ML ~~LOC~~ SOLN
0.0000 [IU] | SUBCUTANEOUS | Status: DC
  Administered 2014-11-14 – 2014-11-15 (×2): 1 [IU] via SUBCUTANEOUS
  Administered 2014-11-15: 2 [IU] via SUBCUTANEOUS

## 2014-11-14 MED ORDER — RAMIPRIL 10 MG PO CAPS
10.0000 mg | ORAL_CAPSULE | Freq: Every day | ORAL | Status: DC
  Administered 2014-11-16: 10 mg via ORAL
  Filled 2014-11-14 (×2): qty 1

## 2014-11-14 NOTE — Consult Note (Signed)
CARDIOLOGY CONSULT NOTE   Patient ID: [name] MRN: [id number], DOB/AGE: [birth year]   Admit date: 11/14/2014 Date of Consult: 11/14/2014   Primary Physician: Shirline Frees, MD Primary Cardiologist: Peter Martinique, MD  Pt. Profile  43F with DM, HTN, HLD and CAD s/p NSTEMI (Feb 2014) s/p DES x 2 to the RCA who presents with a nondisplaced intertrochanteric left proximal femur fracture. She to to undergo a hip repair tomorrow. Cardiology was consulted by medicine to follow along.   Problem List  Past Medical History  Diagnosis Date  . Diabetes mellitus   . Hypertension   . Hypercholesteremia   . CAD (coronary artery disease)     a. NSTEMI in setting of UTI and SVT 12/2012 => LHC 01/08/13: Proximal LAD 30%, ostial diagonal 30-40%, proximal RCA 95%, inferior AK, EF 45-50% => PCI: Promus DES x 2 to RCA;  b. Echo 01/05/13: EF 44-31%, grade 1 diastolic dysfunction, MAC   . SVT (supraventricular tachycardia)   . Rectal ulceration     colonoscopy 12/2012    Past Surgical History  Procedure Laterality Date  . Eye surgery      laser , cat bil  . Tubal ligation    . Kyphoplasty  12/27/2012    Procedure: KYPHOPLASTY;  Surgeon: Sinclair Ship, MD;  Location: [facility];  Service: Orthopedics;  Laterality: Bilateral;  T-8 kyphoplasty  . Colonoscopy Left 01/12/2013    Procedure: COLONOSCOPY;  Surgeon: Wonda Horner, MD;  Location: [facility] ENDOSCOPY;  Service: Endoscopy;  Laterality: Left;  . Colonoscopy N/A 01/14/2013    Procedure: COLONOSCOPY;  Surgeon: Wonda Horner, MD;  Location: [facility] ENDOSCOPY;  Service: Endoscopy;  Laterality: N/A;  Rm 2034, attemped Colon on 2-15=inadequate prep  . Coronary angioplasty  12/2012  . Left heart catheterization with coronary angiogram N/A 01/08/2013    Procedure: LEFT HEART CATHETERIZATION WITH CORONARY ANGIOGRAM;  Surgeon: Thayer Headings, MD;  Location: [facility] CATH LAB;  Service: Cardiovascular;  Laterality: N/A;  . Percutaneous stent intervention N/A 01/15/2013     Procedure: PERCUTANEOUS STENT INTERVENTION;  Surgeon: Peter M Martinique, MD;  Location: [facility] CATH LAB;  Service: Cardiovascular;  Laterality: N/A;     Allergies  No Known Allergies  HPI   43F with DM, HTN, HLD and CAD s/p NSTEMI (Feb 2014) s/p DES x 2 to the RCA who presents with a nondisplaced intertrochanteric left proximal femur fracture. She to to undergo a hip repair tomorrow.   [name] had a NSTEMI on Feb 2014 in the setting of SVT and a UTI. She underwent cardiac cath at that time and was found to have pLAD 30%, ostial diagonal 30-40%, proximal RCA 95%. She underwent stenting of the proximal RCA lesion with 3.0 x 20 mm and 3.0 x 28 mm Promus premier stents. Plavix was stopped in February 2015. Since then, she reports she has done well. She has had no chest pain, dyspnea, LE edema, PND, orthopnea. She can walk up a flight of stairs and take care of all of her ADLs. She has never been limited by dyspnea, chest pain, or claudication.   Inpatient Medications  . docusate sodium  100 mg Oral BID  . [START ON 11/16/2014] furosemide  40 mg Oral Daily  . insulin aspart  0-9 Units Subcutaneous 6 times per day  . metoprolol  50 mg Oral BID  . [START ON 11/16/2014] ramipril  10 mg Oral Q1200  . simvastatin  40 mg Oral QHS    Family History  Family History  Problem Relation Age of Onset  . Hypertension Brother      Social History History   Social History  . Marital Status: Married    Spouse Name: N/A    Number of Children: N/A  . Years of Education: N/A   Occupational History  . Not on file.   Social History Main Topics  . Smoking status: Never Smoker   . Smokeless tobacco: Never Used  . Alcohol Use: No  . Drug Use: No  . Sexual Activity: Yes   Other Topics Concern  . Not on file   Social History Narrative     Review of Systems  General:  No chills, fever, night sweats or weight changes.  Cardiovascular:  No chest pain, dyspnea on exertion, edema, orthopnea,  palpitations, paroxysmal nocturnal dyspnea. Dermatological: No rash, lesions/masses Respiratory: No cough, dyspnea Urologic: No hematuria, dysuria Abdominal:   No nausea, vomiting, diarrhea, bright red blood per rectum, melena, or hematemesis Neurologic:  No visual changes, wkns, changes in mental status. All other systems reviewed and are otherwise negative except as noted above.  Physical Exam  Blood pressure 146/58, pulse 69, temperature 98.1 F (36.7 C), temperature source Oral, resp. rate 16, height 5\' 8"  (1.727 m), weight 95.255 kg (210 lb), SpO2 100 %.  General: Pleasant, NAD Psych: Normal affect. Neuro: Alert and oriented X 3. Moves all extremities spontaneously. HEENT: Normal  Neck: Supple without bruits or JVD. Lungs:  Resp regular and unlabored, CTA. Heart: RRR no s3, s4, or murmurs. Abdomen: Soft, non-tender, non-distended, BS + x 4.  Extremities: No clubbing, cyanosis or edema. DP/PT/Radials 2+ and equal bilaterally.  Labs  No results for input(s): CKTOTAL, CKMB, TROPONINI in the last 72 hours. Lab Results  Component Value Date   WBC 12.5* 11/14/2014   HGB 12.8 11/14/2014   HCT 39.3 11/14/2014   MCV 93.1 11/14/2014   PLT 203 11/14/2014    Recent Labs Lab 11/14/14 1731  NA 135*  K 4.1  CL 95*  CO2 27  BUN 22  CREATININE 0.84  CALCIUM 9.5  GLUCOSE 110*   No results found for: CHOL, HDL, LDLCALC, TRIG No results found for: DDIMER  Radiology/Studies  Dg Hip Complete Left  11/14/2014   CLINICAL DATA:  Patient status post fall. Left hip pain. Initial encounter.  EXAM: LEFT HIP - COMPLETE 2+ VIEW  COMPARISON:  None.  FINDINGS: There is suggestion of a nondisplaced intertrochanteric left proximal femur fracture. The left hip appears located. Cross-table lateral views are limited. SI joints are unremarkable.  IMPRESSION: Findings suggestive of nondisplaced intertrochanteric left proximal femur fracture.   Electronically Signed   By: Lovey Newcomer M.D.   On:  11/14/2014 18:28   Dg Chest Port 1 View  11/14/2014   CLINICAL DATA:  Preop LEFT hip surgery.  No chest complaints.  EXAM: PORTABLE CHEST - 1 VIEW  COMPARISON:  11/12/2013  FINDINGS: Patient is rotated partially due to the orthopedic injury.  Cardiomegaly. Calcified tortuous aorta. No definite infiltrates or failure. Osteopenia.  IMPRESSION: Cardiomegaly, no active disease.   Electronically Signed   By: Rolla Flatten M.D.   On: 11/14/2014 19:27   01/15/13 cardiac cath Lesion Data: Vessel: RCA  Percent stenosis (pre): 95%  TIMI-flow (pre): 3  Stent: 3.0 x 20 mm and 3.0 x 28 mm Promus premier stents  Percent stenosis (post): 0%  TIMI-flow (post): 3   Conclusions: Successful PCI of the proximal to mid RCA with a drug-eluting stents. Procedure was  complicated by difficult guide backup, extreme vessel tortuosity, and extensive dissection post PTCA. With stenting we were able to achieve an excellent angiographic result.   TTE 01/05/13  - Left ventricle: The cavity size was normal. Systolic function was normal. The estimated ejection fraction was in the range of 55% to 65%. Although no diagnostic regional wall motion abnormality was identified, this possibility cannot be completely excluded on the basis of this study. Doppler parameters are consistent with abnormal left ventricular relaxation (grade 1 diastolic dysfunction). - Mitral valve: Calcified annulus.  ECG  No ECG obtained yet.   01/20/14: NSR.   ASSESSMENT AND PLAN 55F with DM, HTN, HLD and CAD s/p NSTEMI (Feb 2014) s/p DES x 2 to the RCA who presents with a nondisplaced intertrochanteric left proximal femur fracture. She to to undergo a hip repair tomorrow. She is moderate risk for a moderate risk procedure. She has no contraindications to surgery and no cardiovascular testing is required prior to surgery. She should be continued on her metoprolol 50mg  BID and ASA 81mg .   Please obtain a pre-operative  ECG.  Cardiology will follow along.    Tobi Bastos, MD 11/14/2014, 10:23 PM

## 2014-11-14 NOTE — ED Notes (Signed)
Pt states she was in the kitchen and trip and fell. Per EMS the leg is shorten and rotated. Pt complains of 10/10 hip pain. Patient has strong pedal pulses.

## 2014-11-14 NOTE — Consult Note (Signed)
Reason for Consult:  Left hip fracture Referring Physician:   EDP  [name] is an 68 y.o. female.  HPI:   68 yo female who sustained an accidental mechanical fall late today.  Was transported to the [facility] ED with left hip pain and the inability to ambulate.  She was found to have a left hip fracture and Orthopedics is consulted.  Due to several medical problems, she is also being evaluated by [facility].  Past Medical History  Diagnosis Date  . Diabetes mellitus   . Hypertension   . Hypercholesteremia   . CAD (coronary artery disease)     a. NSTEMI in setting of UTI and SVT 12/2012 => LHC 01/08/13: Proximal LAD 30%, ostial diagonal 30-40%, proximal RCA 95%, inferior AK, EF 45-50% => PCI: Promus DES x 2 to RCA;  b. Echo 01/05/13: EF 18-56%, grade 1 diastolic dysfunction, MAC   . SVT (supraventricular tachycardia)   . Rectal ulceration     colonoscopy 12/2012    Past Surgical History  Procedure Laterality Date  . Eye surgery      laser , cat bil  . Tubal ligation    . Kyphoplasty  12/27/2012    Procedure: KYPHOPLASTY;  Surgeon: Sinclair Ship, MD;  Location: [facility];  Service: Orthopedics;  Laterality: Bilateral;  T-8 kyphoplasty  . Colonoscopy Left 01/12/2013    Procedure: COLONOSCOPY;  Surgeon: Wonda Horner, MD;  Location: [facility] ENDOSCOPY;  Service: Endoscopy;  Laterality: Left;  . Colonoscopy N/A 01/14/2013    Procedure: COLONOSCOPY;  Surgeon: Wonda Horner, MD;  Location: [facility] ENDOSCOPY;  Service: Endoscopy;  Laterality: N/A;  Rm 2034, attemped Colon on 2-15=inadequate prep  . Coronary angioplasty  12/2012  . Left heart catheterization with coronary angiogram N/A 01/08/2013    Procedure: LEFT HEART CATHETERIZATION WITH CORONARY ANGIOGRAM;  Surgeon: Thayer Headings, MD;  Location: [facility] CATH LAB;  Service: Cardiovascular;  Laterality: N/A;  . Percutaneous stent intervention N/A 01/15/2013    Procedure: PERCUTANEOUS STENT INTERVENTION;  Surgeon: Peter M Martinique, MD;  Location:  [facility] CATH LAB;  Service: Cardiovascular;  Laterality: N/A;    Family History  Problem Relation Age of Onset  . Hypertension Brother     Social History:  reports that she has never smoked. She has never used smokeless tobacco. She reports that she does not drink alcohol or use illicit drugs.  Allergies: No Known Allergies  Medications: I have reviewed the patient's current medications.  Results for orders placed or performed during the hospital encounter of 11/14/14 (from the past 48 hour(s))  CBC with Differential     Status: Abnormal   Collection Time: 11/14/14  5:31 PM  Result Value Ref Range   WBC 12.5 (H) 4.0 - 10.5 K/uL   RBC 4.22 3.87 - 5.11 MIL/uL   Hemoglobin 12.8 12.0 - 15.0 g/dL   HCT 39.3 36.0 - 46.0 %   MCV 93.1 78.0 - 100.0 fL   MCH 30.3 26.0 - 34.0 pg   MCHC 32.6 30.0 - 36.0 g/dL   RDW 13.2 11.5 - 15.5 %   Platelets 203 150 - 400 K/uL   Neutrophils Relative % 72 43 - 77 %   Neutro Abs 9.0 (H) 1.7 - 7.7 K/uL   Lymphocytes Relative 19 12 - 46 %   Lymphs Abs 2.4 0.7 - 4.0 K/uL   Monocytes Relative 7 3 - 12 %   Monocytes Absolute 0.9 0.1 - 1.0 K/uL   Eosinophils Relative 2  0 - 5 %   Eosinophils Absolute 0.2 0.0 - 0.7 K/uL   Basophils Relative 0 0 - 1 %   Basophils Absolute 0.0 0.0 - 0.1 K/uL  Basic metabolic panel     Status: Abnormal   Collection Time: 11/14/14  5:31 PM  Result Value Ref Range   Sodium 135 (L) 137 - 147 mEq/L   Potassium 4.1 3.7 - 5.3 mEq/L   Chloride 95 (L) 96 - 112 mEq/L   CO2 27 19 - 32 mEq/L   Glucose, Bld 110 (H) 70 - 99 mg/dL   BUN 22 6 - 23 mg/dL   Creatinine, Ser 0.84 0.50 - 1.10 mg/dL   Calcium 9.5 8.4 - 10.5 mg/dL   GFR calc non Af Amer 69 (L) >90 mL/min   GFR calc Af Amer 80 (L) >90 mL/min    Comment: (NOTE) The eGFR has been calculated using the CKD EPI equation. This calculation has not been validated in all clinical situations. eGFR's persistently <90 mL/min signify possible Chronic Kidney Disease.    Anion gap 13 5 - 15     Dg Hip Complete Left  11/14/2014   CLINICAL DATA:  Patient status post fall. Left hip pain. Initial encounter.  EXAM: LEFT HIP - COMPLETE 2+ VIEW  COMPARISON:  None.  FINDINGS: There is suggestion of a nondisplaced intertrochanteric left proximal femur fracture. The left hip appears located. Cross-table lateral views are limited. SI joints are unremarkable.  IMPRESSION: Findings suggestive of nondisplaced intertrochanteric left proximal femur fracture.   Electronically Signed   By: Lovey Newcomer M.D.   On: 11/14/2014 18:28   Dg Chest Port 1 View  11/14/2014   CLINICAL DATA:  Preop LEFT hip surgery.  No chest complaints.  EXAM: PORTABLE CHEST - 1 VIEW  COMPARISON:  11/12/2013  FINDINGS: Patient is rotated partially due to the orthopedic injury.  Cardiomegaly. Calcified tortuous aorta. No definite infiltrates or failure. Osteopenia.  IMPRESSION: Cardiomegaly, no active disease.   Electronically Signed   By: Rolla Flatten M.D.   On: 11/14/2014 19:27    ROS Blood pressure 139/65, pulse 65, temperature 98.4 F (36.9 C), temperature source Oral, resp. rate 14, height 5' 8"  (1.727 m), weight 95.255 kg (210 lb), SpO2 94 %. Physical Exam  Musculoskeletal:       Left hip: She exhibits decreased range of motion, decreased strength, bony tenderness, swelling and deformity.  Her left foot is well-perfused and she moves her foot easily.  Assessment/Plan: Left hip with displaced intertrochanteric proximal femur fracture 1)  I have spoken to her in length and have recommended open reduction/internal fixation of her left hip fracture.  She understands fully the risks of acute blood loss anemia, infection, DVT.  She understands the goals are decreased pain and improved mobility.  She is graciously being admitted to the [facility].  I plan on taking her to the OR tomorrow for surgery to fix her broken hip  [name] 11/14/2014, 8:50 PM

## 2014-11-14 NOTE — ED Provider Notes (Addendum)
CSN: [id number]     Arrival date & time 11/14/14  1659 History   First MD Initiated Contact with Patient 11/14/14 1731     Chief Complaint  Patient presents with  . Hip Injury  . Fall     (Consider location/radiation/quality/duration/timing/severity/associated sxs/prior Treatment) Patient is a 68 y.o. female presenting with fall. The history is provided by the patient and the EMS personnel.  Fall Pertinent negatives include no chest pain, no abdominal pain, no headaches and no shortness of breath.  pt s/p trip and fall in her kitchen today. Pt describes a clear mechanical fall, no faintness or dizziness prior to fall. No loc w fall. Denies head injury or headache. No neck or back pain. Left hip pain constant, dull, severe, worse w attempted movement or turning. No hx prior hip injury or surgery. Pt denies knee pain. Pt denies any other pain or injury. No leg numbness/weakness.  Skin intact.     Past Medical History  Diagnosis Date  . Diabetes mellitus   . Hypertension   . Hypercholesteremia   . CAD (coronary artery disease)     a. NSTEMI in setting of UTI and SVT 12/2012 => LHC 01/08/13: Proximal LAD 30%, ostial diagonal 30-40%, proximal RCA 95%, inferior AK, EF 45-50% => PCI: Promus DES x 2 to RCA;  b. Echo 01/05/13: EF 63-78%, grade 1 diastolic dysfunction, MAC   . SVT (supraventricular tachycardia)   . Rectal ulceration     colonoscopy 12/2012   Past Surgical History  Procedure Laterality Date  . Eye surgery      laser , cat bil  . Tubal ligation    . Kyphoplasty  12/27/2012    Procedure: KYPHOPLASTY;  Surgeon: Sinclair Ship, MD;  Location: [facility];  Service: Orthopedics;  Laterality: Bilateral;  T-8 kyphoplasty  . Colonoscopy Left 01/12/2013    Procedure: COLONOSCOPY;  Surgeon: Wonda Horner, MD;  Location: [facility] ENDOSCOPY;  Service: Endoscopy;  Laterality: Left;  . Colonoscopy N/A 01/14/2013    Procedure: COLONOSCOPY;  Surgeon: Wonda Horner, MD;  Location: [facility] ENDOSCOPY;   Service: Endoscopy;  Laterality: N/A;  Rm 2034, attemped Colon on 2-15=inadequate prep  . Coronary angioplasty  12/2012  . Left heart catheterization with coronary angiogram N/A 01/08/2013    Procedure: LEFT HEART CATHETERIZATION WITH CORONARY ANGIOGRAM;  Surgeon: Thayer Headings, MD;  Location: [facility] CATH LAB;  Service: Cardiovascular;  Laterality: N/A;  . Percutaneous stent intervention N/A 01/15/2013    Procedure: PERCUTANEOUS STENT INTERVENTION;  Surgeon: Peter M Martinique, MD;  Location: [facility] CATH LAB;  Service: Cardiovascular;  Laterality: N/A;   No family history on file. History  Substance Use Topics  . Smoking status: Never Smoker   . Smokeless tobacco: Never Used  . Alcohol Use: No   OB History    No data available     Review of Systems  Constitutional: Negative for fever and chills.  HENT: Negative for sore throat.   Eyes: Negative for redness.  Respiratory: Negative for shortness of breath.   Cardiovascular: Negative for chest pain.  Gastrointestinal: Negative for vomiting, abdominal pain and diarrhea.  Genitourinary: Negative for flank pain.  Musculoskeletal: Negative for back pain and neck pain.  Skin: Negative for wound.  Neurological: Negative for weakness, numbness and headaches.  Hematological: Does not bruise/bleed easily.  Psychiatric/Behavioral: Negative for confusion.      Allergies  Review of patient's allergies indicates no known allergies.  Home Medications   Prior to Admission medications  Medication Sig Start Date End Date Taking? Authorizing Provider  aspirin EC 81 MG tablet Take 1 tablet (81 mg total) by mouth daily. 04/16/13  Yes Liliane Shi, PA-C  Cholecalciferol (VITAMIN D3) 1000 UNITS CAPS Take 2,000 Units by mouth every morning.    Yes Historical Provider, MD  clopidogrel (PLAVIX) 75 MG tablet Take 1 tablet (75 mg total) by mouth daily with breakfast. 01/17/13  Yes Hosie Poisson, MD  furosemide (LASIX) 40 MG tablet Take 40 mg by mouth every  morning.    Yes Historical Provider, MD  insulin glargine (LANTUS) 100 UNIT/ML injection Inject 0.05 mLs (5 Units total) into the skin 2 (two) times daily. 11/13/13 06/23/15 Yes Christina P Rama, MD  metFORMIN (GLUCOPHAGE) 1000 MG tablet Take 1,000 mg by mouth 2 (two) times daily with a meal.   Yes Historical Provider, MD  metoprolol (LOPRESSOR) 50 MG tablet Take 1 tablet (50 mg total) by mouth 2 (two) times daily. 01/17/13  Yes Hosie Poisson, MD  Multiple Vitamin (MULITIVITAMIN WITH MINERALS) TABS Take 1 tablet by mouth every morning.    Yes Historical Provider, MD  pioglitazone (ACTOS) 30 MG tablet Take 30 mg by mouth every morning.   Yes Historical Provider, MD  ramipril (ALTACE) 10 MG capsule Take 10 mg by mouth daily at 12 noon.    Yes Historical Provider, MD  simvastatin (ZOCOR) 40 MG tablet Take 40 mg by mouth at bedtime.   Yes Historical Provider, MD  nitroGLYCERIN (NITROSTAT) 0.4 MG SL tablet Place 1 tablet (0.4 mg total) under the tongue every 5 (five) minutes as needed for chest pain. 04/16/13   Liliane Shi, PA-C  ondansetron (ZOFRAN ODT) 8 MG disintegrating tablet Take 1 tablet (8 mg total) by mouth every 8 (eight) hours as needed for nausea. 12/10/12   Harden Mo, MD   BP 128/77 mmHg  Pulse 71  Temp(Src) 98.4 F (36.9 C) (Oral)  Resp 20  Ht 5\' 8"  (1.727 m)  Wt 210 lb (95.255 kg)  BMI 31.94 kg/m2  SpO2 97% Physical Exam  Constitutional: She is oriented to person, place, and time. She appears well-developed and well-nourished. No distress.  HENT:  Head: Atraumatic.  Mouth/Throat: Oropharynx is clear and moist.  Eyes: Conjunctivae are normal. Pupils are equal, round, and reactive to light. No scleral icterus.  Neck: Normal range of motion. Neck supple. No tracheal deviation present.  Cardiovascular: Normal rate, regular rhythm, normal heart sounds and intact distal pulses.   Pulmonary/Chest: Effort normal and breath sounds normal. No respiratory distress. She exhibits no  tenderness.  Abdominal: Soft. Normal appearance and bowel sounds are normal. She exhibits no distension. There is no tenderness.  Genitourinary:  No cva tenderness  Musculoskeletal: She exhibits no edema or tenderness.  CTLS spine, non tender, aligned, no step off. Limited rom left hip due to severe pain. Skin intact. Distal pulses palp. No other focal pain or bony tenderness on bil ext exam.   Neurological: She is alert and oriented to person, place, and time.  Motor intact bil. sens grossly intact.   Skin: Skin is warm and dry. No rash noted. She is not diaphoretic.  Psychiatric: She has a normal mood and affect.  Nursing note and vitals reviewed.   ED Course  Procedures (including critical care time) Labs Review  Results for orders placed or performed during the hospital encounter of 11/14/14  CBC with Differential  Result Value Ref Range   WBC 12.5 (H) 4.0 - 10.5 K/uL  RBC 4.22 3.87 - 5.11 MIL/uL   Hemoglobin 12.8 12.0 - 15.0 g/dL   HCT 39.3 36.0 - 46.0 %   MCV 93.1 78.0 - 100.0 fL   MCH 30.3 26.0 - 34.0 pg   MCHC 32.6 30.0 - 36.0 g/dL   RDW 13.2 11.5 - 15.5 %   Platelets 203 150 - 400 K/uL   Neutrophils Relative % 72 43 - 77 %   Neutro Abs 9.0 (H) 1.7 - 7.7 K/uL   Lymphocytes Relative 19 12 - 46 %   Lymphs Abs 2.4 0.7 - 4.0 K/uL   Monocytes Relative 7 3 - 12 %   Monocytes Absolute 0.9 0.1 - 1.0 K/uL   Eosinophils Relative 2 0 - 5 %   Eosinophils Absolute 0.2 0.0 - 0.7 K/uL   Basophils Relative 0 0 - 1 %   Basophils Absolute 0.0 0.0 - 0.1 K/uL  Basic metabolic panel  Result Value Ref Range   Sodium 135 (L) 137 - 147 mEq/L   Potassium 4.1 3.7 - 5.3 mEq/L   Chloride 95 (L) 96 - 112 mEq/L   CO2 27 19 - 32 mEq/L   Glucose, Bld 110 (H) 70 - 99 mg/dL   BUN 22 6 - 23 mg/dL   Creatinine, Ser 0.84 0.50 - 1.10 mg/dL   Calcium 9.5 8.4 - 10.5 mg/dL   GFR calc non Af Amer 69 (L) >90 mL/min   GFR calc Af Amer 80 (L) >90 mL/min   Anion gap 13 5 - 15   Dg Hip Complete  Left  11/14/2014   CLINICAL DATA:  Patient status post fall. Left hip pain. Initial encounter.  EXAM: LEFT HIP - COMPLETE 2+ VIEW  COMPARISON:  None.  FINDINGS: There is suggestion of a nondisplaced intertrochanteric left proximal femur fracture. The left hip appears located. Cross-table lateral views are limited. SI joints are unremarkable.  IMPRESSION: Findings suggestive of nondisplaced intertrochanteric left proximal femur fracture.   Electronically Signed   By: Lovey Newcomer M.D.   On: 11/14/2014 18:28      Date: 11/14/2014  Rate: 69  Rhythm: normal sinus rhythm  QRS Axis: normal  Intervals: normal  ST/T Wave abnormalities: normal  Conduction Disutrbances:none  Narrative Interpretation:   Old EKG Reviewed: unchanged    MDM   Iv ns. Dilaudid 1 mg iv. zofran iv.    Reviewed nursing notes and prior charts for additional history.   Discussed xrays w pt.   Discussed pt with Ortho, Dr Ninfa Linden - he states tentatively would plan OR for tomorrow, med service to admit.  hospitalist paged for admission.  Recheck pain improved.      Mirna Mires, MD 11/14/14 [phone]

## 2014-11-14 NOTE — H&P (Addendum)
PCP: Shirline Frees, MD  Endocrinology [facility]  Chief Complaint:  [name]   HPI: [name] is a 68 y.o. female   has a past medical history of Diabetes mellitus; Hypertension; Hypercholesteremia; CAD (coronary artery disease); SVT (supraventricular tachycardia); and Rectal ulceration.   Presented with  Patient tripped and fell this afternoon. She was not able to stand up. Husband called 40. No head injury, No LOC.  Patient was transferred to [facility] and was found nondisplaced intertrochanteric left proximal femur fracture. Patient had DES placed on 01/15/2013 in the setting of NSTEMI and is currently on Plavix. No chest pain.  Dr. Ninfa Linden was notified and plans to take her to OR in AM.   Hospitalist was called for admission for Left hip fracture   Review of Systems:    Pertinent positives include:  Hip pain,  Constitutional:  No weight loss, night sweats, Fevers, chills, fatigue, weight loss  HEENT:  No headaches, Difficulty swallowing,Tooth/dental problems,Sore throat,  No sneezing, itching, ear ache, nasal congestion, post nasal drip,  Cardio-vascular:  No chest pain, Orthopnea, PND, anasarca, dizziness, palpitations.no Bilateral lower extremity swelling  GI:  No heartburn, indigestion, abdominal pain, nausea, vomiting, diarrhea, change in bowel habits, loss of appetite, melena, blood in stool, hematemesis Resp:  no shortness of breath at rest. No dyspnea on exertion, No excess mucus, no productive cough, No non-productive cough, No coughing up of blood.No change in color of mucus.No wheezing. Skin:  no rash or lesions. No jaundice GU:  no dysuria, change in color of urine, no urgency or frequency. No straining to urinate.  No flank pain.  Musculoskeletal:  No joint pain or no joint swelling. No decreased range of motion. No back pain.  Psych:  No change in mood or affect. No depression or anxiety. No memory loss.  Neuro: no  localizing neurological complaints, no tingling, no weakness, no double vision, no gait abnormality, no slurred speech, no confusion  Otherwise ROS are negative except for above, 10 systems were reviewed  Past Medical History: Past Medical History  Diagnosis Date  . Diabetes mellitus   . Hypertension   . Hypercholesteremia   . CAD (coronary artery disease)     a. NSTEMI in setting of UTI and SVT 12/2012 => LHC 01/08/13: Proximal LAD 30%, ostial diagonal 30-40%, proximal RCA 95%, inferior AK, EF 45-50% => PCI: Promus DES x 2 to RCA;  b. Echo 01/05/13: EF 31-54%, grade 1 diastolic dysfunction, MAC   . SVT (supraventricular tachycardia)   . Rectal ulceration     colonoscopy 12/2012   Past Surgical History  Procedure Laterality Date  . Eye surgery      laser , cat bil  . Tubal ligation    . Kyphoplasty  12/27/2012    Procedure: KYPHOPLASTY;  Surgeon: Sinclair Ship, MD;  Location: [facility];  Service: Orthopedics;  Laterality: Bilateral;  T-8 kyphoplasty  . Colonoscopy Left 01/12/2013    Procedure: COLONOSCOPY;  Surgeon: Wonda Horner, MD;  Location: [facility] ENDOSCOPY;  Service: Endoscopy;  Laterality: Left;  . Colonoscopy N/A 01/14/2013    Procedure: COLONOSCOPY;  Surgeon: Wonda Horner, MD;  Location: [facility] ENDOSCOPY;  Service: Endoscopy;  Laterality: N/A;  Rm 2034, attemped Colon on 2-15=inadequate prep  . Coronary angioplasty  12/2012  . Left heart catheterization with coronary angiogram N/A 01/08/2013    Procedure: LEFT HEART CATHETERIZATION WITH CORONARY ANGIOGRAM;  Surgeon: Thayer Headings, MD;  Location: [facility] CATH LAB;  Service:  Cardiovascular;  Laterality: N/A;  . Percutaneous stent intervention N/A 01/15/2013    Procedure: PERCUTANEOUS STENT INTERVENTION;  Surgeon: Peter M Martinique, MD;  Location: [facility] CATH LAB;  Service: Cardiovascular;  Laterality: N/A;     Medications: Prior to Admission medications   Medication Sig Start Date End Date Taking? Authorizing Provider  aspirin EC 81 MG tablet  Take 1 tablet (81 mg total) by mouth daily. 04/16/13  Yes Liliane Shi, PA-C  Cholecalciferol (VITAMIN D3) 1000 UNITS CAPS Take 2,000 Units by mouth every morning.    Yes Historical Provider, MD  clopidogrel (PLAVIX) 75 MG tablet Take 1 tablet (75 mg total) by mouth daily with breakfast. 01/17/13  Yes Hosie Poisson, MD  furosemide (LASIX) 40 MG tablet Take 40 mg by mouth every morning.    Yes Historical Provider, MD  insulin glargine (LANTUS) 100 UNIT/ML injection Inject 0.05 mLs (5 Units total) into the skin 2 (two) times daily. 11/13/13 06/23/15 Yes Christina P Rama, MD  metFORMIN (GLUCOPHAGE) 1000 MG tablet Take 1,000 mg by mouth 2 (two) times daily with a meal.   Yes Historical Provider, MD  metoprolol (LOPRESSOR) 50 MG tablet Take 1 tablet (50 mg total) by mouth 2 (two) times daily. 01/17/13  Yes Hosie Poisson, MD  Multiple Vitamin (MULITIVITAMIN WITH MINERALS) TABS Take 1 tablet by mouth every morning.    Yes Historical Provider, MD  pioglitazone (ACTOS) 30 MG tablet Take 30 mg by mouth every morning.   Yes Historical Provider, MD  ramipril (ALTACE) 10 MG capsule Take 10 mg by mouth daily at 12 noon.    Yes Historical Provider, MD  simvastatin (ZOCOR) 40 MG tablet Take 40 mg by mouth at bedtime.   Yes Historical Provider, MD  nitroGLYCERIN (NITROSTAT) 0.4 MG SL tablet Place 1 tablet (0.4 mg total) under the tongue every 5 (five) minutes as needed for chest pain. 04/16/13   Liliane Shi, PA-C  ondansetron (ZOFRAN ODT) 8 MG disintegrating tablet Take 1 tablet (8 mg total) by mouth every 8 (eight) hours as needed for nausea. 12/10/12   Harden Mo, MD    Allergies:  No Known Allergies  Social History:  Ambulatory  Independently  Lives at home   With family     reports that she has never smoked. She has never used smokeless tobacco. She reports that she does not drink alcohol or use illicit drugs.    Family History: family history includes Hypertension in her brother.    Physical  Exam: Patient Vitals for the past 24 hrs:  BP Temp Temp src Pulse Resp SpO2 Height Weight  11/14/14 2015 139/65 mmHg - - 65 14 94 % - -  11/14/14 1930 (!) 133/51 mmHg - - 81 14 96 % - -  11/14/14 1821 - - - 68 13 93 % - -  11/14/14 1818 147/80 mmHg - - 89 - (!) 89 % - -  11/14/14 1715 128/77 mmHg - - 71 - 97 % - -  11/14/14 1705 157/76 mmHg 98.4 F (36.9 C) Oral 70 20 97 % 5\' 8"  (1.727 m) 95.255 kg (210 lb)    1. General:  in No Acute distress 2. Psychological: Alert and  Oriented 3. Head/ENT:   Moist   Mucous Membranes                          Head Non traumatic, neck supple  Normal  Dentition 4. SKIN:  decreased Skin turgor,  Skin clean Dry and intact no rash 5. Heart: Regular rate and rhythm no Murmur, Rub or gallop 6. Lungs: Clear to auscultation bilaterally, no wheezes or crackles   7. Abdomen: Soft, non-tender, Non distended 8. Lower extremities: no clubbing, cyanosis, or edema 9. Neurologically Grossly intact, moving all 4 extremities equally 10. MSK: Normal range of motion except in left hip due to pain  body mass index is 31.94 kg/(m^2).   Labs on Admission:   Results for orders placed or performed during the hospital encounter of 11/14/14 (from the past 24 hour(s))  CBC with Differential     Status: Abnormal   Collection Time: 11/14/14  5:31 PM  Result Value Ref Range   WBC 12.5 (H) 4.0 - 10.5 K/uL   RBC 4.22 3.87 - 5.11 MIL/uL   Hemoglobin 12.8 12.0 - 15.0 g/dL   HCT 39.3 36.0 - 46.0 %   MCV 93.1 78.0 - 100.0 fL   MCH 30.3 26.0 - 34.0 pg   MCHC 32.6 30.0 - 36.0 g/dL   RDW 13.2 11.5 - 15.5 %   Platelets 203 150 - 400 K/uL   Neutrophils Relative % 72 43 - 77 %   Neutro Abs 9.0 (H) 1.7 - 7.7 K/uL   Lymphocytes Relative 19 12 - 46 %   Lymphs Abs 2.4 0.7 - 4.0 K/uL   Monocytes Relative 7 3 - 12 %   Monocytes Absolute 0.9 0.1 - 1.0 K/uL   Eosinophils Relative 2 0 - 5 %   Eosinophils Absolute 0.2 0.0 - 0.7 K/uL   Basophils Relative 0 0 -  1 %   Basophils Absolute 0.0 0.0 - 0.1 K/uL  Basic metabolic panel     Status: Abnormal   Collection Time: 11/14/14  5:31 PM  Result Value Ref Range   Sodium 135 (L) 137 - 147 mEq/L   Potassium 4.1 3.7 - 5.3 mEq/L   Chloride 95 (L) 96 - 112 mEq/L   CO2 27 19 - 32 mEq/L   Glucose, Bld 110 (H) 70 - 99 mg/dL   BUN 22 6 - 23 mg/dL   Creatinine, Ser 0.84 0.50 - 1.10 mg/dL   Calcium 9.5 8.4 - 10.5 mg/dL   GFR calc non Af Amer 69 (L) >90 mL/min   GFR calc Af Amer 80 (L) >90 mL/min   Anion gap 13 5 - 15    UA not obtained has ordered  Lab Results  Component Value Date   HGBA1C 5.8* 11/12/2013    Estimated Creatinine Clearance: 75.3 mL/min (by C-G formula based on Cr of 0.84).  BNP (last 3 results) No results for input(s): PROBNP in the last 8760 hours.  Other results:  I have pearsonaly reviewed this: ECG REPORT  Not obtained but will order [facility] Weights   11/14/14 1705  Weight: 95.255 kg (210 lb)   Cultures:    Component Value Date/Time   SDES URINE, CLEAN CATCH 11/12/2013 [place] 11/12/2013 0513   CULT  11/12/2013 0513    ESCHERICHIA COLI Performed at [facility] 11/13/2013 FINAL 11/12/2013 0513     Radiological Exams on Admission: Dg Hip Complete Left  11/14/2014   CLINICAL DATA:  Patient status post fall. Left hip pain. Initial encounter.  EXAM: LEFT HIP - COMPLETE 2+ VIEW  COMPARISON:  None.  FINDINGS: There is suggestion of a nondisplaced intertrochanteric left proximal femur fracture. The left hip appears  located. Cross-table lateral views are limited. SI joints are unremarkable.  IMPRESSION: Findings suggestive of nondisplaced intertrochanteric left proximal femur fracture.   Electronically Signed   By: Lovey Newcomer M.D.   On: 11/14/2014 18:28   Dg Chest Port 1 View  11/14/2014   CLINICAL DATA:  Preop LEFT hip surgery.  No chest complaints.  EXAM: PORTABLE CHEST - 1 VIEW  COMPARISON:  11/12/2013  FINDINGS: Patient is  rotated partially due to the orthopedic injury.  Cardiomegaly. Calcified tortuous aorta. No definite infiltrates or failure. Osteopenia.  IMPRESSION: Cardiomegaly, no active disease.   Electronically Signed   By: Rolla Flatten M.D.   On: 11/14/2014 19:27    Chart has been reviewed  Assessment/Plan  68 year old female with history of coronary artery disease status post stenting with in February 2014 currently on Plavix history of diabetes and hypertension presents with intertrochanteric fracture of the left hip after mechanical fall. Dr. Ninfa Linden is aware and planning for OR time in the morning  Present on Admission:  . Intertrochanteric fracture of left hip - management as per orthopedics. We'll discontinue Plavix, we will notify cardiology the patient is being admitted for cardiac clearance given hx of coronary artery disease. Patient is moderate risk given known CAD, HTN and age. Continue home metoprolol perioperatively.  . Coronary atherosclerosis of native coronary artery - hold plavix as patient will need operative intervention.  Marland Kitchen HTN (hypertension) - continue metoprolol Leukocytosis - chest x-ray showed no evidence of infiltrate. Will obtain UA. Leukocytosis most likely secondary to stress Diabetes - hold metformin while inpatient and Lantus and Actos while nothing by mouth.  will restart when patient tolerating diet ordered sensitive sliding scale  UTI - UA came back showing evidence of UTI will order rocephin Prophylaxis: SCD, Protonix  CODE STATUS:  FULL CODE   Other plan as per orders.  I have spent a total of 55 min on this admission  [name] 11/14/2014, 8:28 PM  [facility]  Pager [phone]   after 2 AM please page floor coverage PA If 7AM-7PM, please contact the day team taking care of the patient  [email or web address]  Password [email or web address]

## 2014-11-15 ENCOUNTER — Encounter (HOSPITAL_COMMUNITY)
Admission: EM | Disposition: A | Discharge status: Skilled Nursing Facility | Payer: Self-pay | Source: Home / Self Care | Attending: Internal Medicine

## 2014-11-15 ENCOUNTER — Inpatient Hospital Stay (HOSPITAL_COMMUNITY): Payer: Medicare Other | Admitting: Certified Registered Nurse Anesthetist

## 2014-11-15 ENCOUNTER — Inpatient Hospital Stay: Admit: 2014-11-15 | Payer: Medicare Other | Admitting: Orthopaedic Surgery

## 2014-11-15 ENCOUNTER — Inpatient Hospital Stay (HOSPITAL_COMMUNITY): Payer: Medicare Other

## 2014-11-15 ENCOUNTER — Encounter (HOSPITAL_COMMUNITY): Payer: Self-pay | Admitting: Certified Registered Nurse Anesthetist

## 2014-11-15 DIAGNOSIS — I10 Essential (primary) hypertension: Secondary | ICD-10-CM

## 2014-11-15 DIAGNOSIS — E119 Type 2 diabetes mellitus without complications: Secondary | ICD-10-CM

## 2014-11-15 DIAGNOSIS — N390 Urinary tract infection, site not specified: Secondary | ICD-10-CM

## 2014-11-15 DIAGNOSIS — Z01810 Encounter for preprocedural cardiovascular examination: Secondary | ICD-10-CM

## 2014-11-15 HISTORY — PX: ORIF HIP FRACTURE: SHX2125

## 2014-11-15 LAB — URINALYSIS, ROUTINE W REFLEX MICROSCOPIC
BILIRUBIN URINE: NEGATIVE
Bilirubin Urine: NEGATIVE
GLUCOSE, UA: NEGATIVE mg/dL
GLUCOSE, UA: NEGATIVE mg/dL
KETONES UR: 15 mg/dL — AB
KETONES UR: NEGATIVE mg/dL
Nitrite: POSITIVE — AB
Nitrite: POSITIVE — AB
PH: 6.5 (ref 5.0–8.0)
PROTEIN: NEGATIVE mg/dL
Protein, ur: 100 mg/dL — AB
Specific Gravity, Urine: 1.011 (ref 1.005–1.030)
Specific Gravity, Urine: 1.014 (ref 1.005–1.030)
UROBILINOGEN UA: 0.2 mg/dL (ref 0.0–1.0)
Urobilinogen, UA: 0.2 mg/dL (ref 0.0–1.0)
pH: 6.5 (ref 5.0–8.0)

## 2014-11-15 LAB — GLUCOSE, CAPILLARY
GLUCOSE-CAPILLARY: 161 mg/dL — AB (ref 70–99)
GLUCOSE-CAPILLARY: 127 mg/dL — AB (ref 70–99)
GLUCOSE-CAPILLARY: 147 mg/dL — AB (ref 70–99)
Glucose-Capillary: 133 mg/dL — ABNORMAL HIGH (ref 70–99)
Glucose-Capillary: 126 mg/dL — ABNORMAL HIGH (ref 70–99)
Glucose-Capillary: 129 mg/dL — ABNORMAL HIGH (ref 70–99)
Glucose-Capillary: 170 mg/dL — ABNORMAL HIGH (ref 70–99)

## 2014-11-15 LAB — SURGICAL PCR SCREEN
MRSA, PCR: NEGATIVE
Staphylococcus aureus: NEGATIVE

## 2014-11-15 LAB — URINE MICROSCOPIC-ADD ON

## 2014-11-15 LAB — BASIC METABOLIC PANEL
Anion gap: 8 (ref 5–15)
BUN: 16 mg/dL (ref 6–23)
CHLORIDE: 98 meq/L (ref 96–112)
CO2: 29 mEq/L (ref 19–32)
CREATININE: 0.63 mg/dL (ref 0.50–1.10)
Calcium: 9.1 mg/dL (ref 8.4–10.5)
GFR calc non Af Amer: 89 mL/min — ABNORMAL LOW (ref >90)
GLUCOSE: 139 mg/dL — AB (ref 70–99)
Potassium: 3.9 mEq/L (ref 3.7–5.3)
Sodium: 135 mEq/L — ABNORMAL LOW (ref 137–147)

## 2014-11-15 LAB — CBC
HEMATOCRIT: 35.4 % — AB (ref 36.0–46.0)
HEMOGLOBIN: 11.7 g/dL — AB (ref 12.0–15.0)
MCH: 30.6 pg (ref 26.0–34.0)
MCHC: 33.1 g/dL (ref 30.0–36.0)
MCV: 92.7 fL (ref 78.0–100.0)
Platelets: 157 10*3/uL (ref 150–400)
RBC: 3.82 MIL/uL — ABNORMAL LOW (ref 3.87–5.11)
RDW: 13.3 % (ref 11.5–15.5)
WBC: 7.7 10*3/uL (ref 4.0–10.5)

## 2014-11-15 LAB — HEMOGLOBIN A1C
Hgb A1c MFr Bld: 6.2 % — ABNORMAL HIGH (ref <5.7)
Mean Plasma Glucose: 131 mg/dL — ABNORMAL HIGH (ref <117)

## 2014-11-15 SURGERY — OPEN REDUCTION INTERNAL FIXATION HIP
Anesthesia: General | Site: Hip | Laterality: Left

## 2014-11-15 MED ORDER — DOCUSATE SODIUM 100 MG PO CAPS
100.0000 mg | ORAL_CAPSULE | Freq: Two times a day (BID) | ORAL | Status: DC
  Administered 2014-11-15 – 2014-11-18 (×6): 100 mg via ORAL
  Filled 2014-11-15 (×6): qty 1

## 2014-11-15 MED ORDER — TEMAZEPAM 15 MG PO CAPS
15.0000 mg | ORAL_CAPSULE | Freq: Every evening | ORAL | Status: DC | PRN
  Administered 2014-11-15 – 2014-11-16 (×2): 15 mg via ORAL
  Filled 2014-11-15 (×2): qty 1

## 2014-11-15 MED ORDER — HYDROCODONE-ACETAMINOPHEN 5-325 MG PO TABS
1.0000 | ORAL_TABLET | Freq: Four times a day (QID) | ORAL | Status: DC | PRN
  Administered 2014-11-17 – 2014-11-18 (×2): 2 via ORAL
  Filled 2014-11-15 (×2): qty 2

## 2014-11-15 MED ORDER — PROPOFOL 10 MG/ML IV BOLUS
INTRAVENOUS | Status: AC
  Filled 2014-11-15: qty 20

## 2014-11-15 MED ORDER — INSULIN GLARGINE 100 UNIT/ML ~~LOC~~ SOLN
5.0000 [IU] | Freq: Two times a day (BID) | SUBCUTANEOUS | Status: DC
  Administered 2014-11-15 – 2014-11-18 (×6): 5 [IU] via SUBCUTANEOUS
  Filled 2014-11-15 (×8): qty 0.05

## 2014-11-15 MED ORDER — SUCCINYLCHOLINE CHLORIDE 20 MG/ML IJ SOLN
INTRAMUSCULAR | Status: DC | PRN
  Administered 2014-11-15: 100 mg via INTRAVENOUS

## 2014-11-15 MED ORDER — LACTATED RINGERS IV SOLN
INTRAVENOUS | Status: DC | PRN
  Administered 2014-11-15: 08:00:00 via INTRAVENOUS

## 2014-11-15 MED ORDER — ONDANSETRON HCL 4 MG PO TABS: 4.0000 mg | ORAL_TABLET | Freq: Four times a day (QID) | ORAL | Status: DC | PRN

## 2014-11-15 MED ORDER — ONDANSETRON HCL 4 MG/2ML IJ SOLN
INTRAMUSCULAR | Status: AC
  Filled 2014-11-15: qty 2

## 2014-11-15 MED ORDER — CEFAZOLIN SODIUM-DEXTROSE 2-3 GM-% IV SOLR
INTRAVENOUS | Status: DC | PRN
  Administered 2014-11-15: 2 g via INTRAVENOUS

## 2014-11-15 MED ORDER — FENTANYL CITRATE 0.05 MG/ML IJ SOLN
INTRAMUSCULAR | Status: AC
  Filled 2014-11-15: qty 5

## 2014-11-15 MED ORDER — ONDANSETRON HCL 4 MG/2ML IJ SOLN
INTRAMUSCULAR | Status: DC | PRN
  Administered 2014-11-15 (×2): 4 mg via INTRAVENOUS

## 2014-11-15 MED ORDER — METOCLOPRAMIDE HCL 5 MG PO TABS
5.0000 mg | ORAL_TABLET | Freq: Three times a day (TID) | ORAL | Status: DC | PRN
  Filled 2014-11-15 (×2): qty 2

## 2014-11-15 MED ORDER — EPHEDRINE SULFATE 50 MG/ML IJ SOLN
INTRAMUSCULAR | Status: DC | PRN
Start: 2014-11-15 — End: 2014-11-15
  Administered 2014-11-15: 10 mg via INTRAVENOUS
  Administered 2014-11-15 (×2): 5 mg via INTRAVENOUS

## 2014-11-15 MED ORDER — PHENOL 1.4 % MT LIQD: 1.0000 | OROMUCOSAL | Status: DC | PRN

## 2014-11-15 MED ORDER — OXYCODONE HCL 5 MG PO TABS: 5.0000 mg | ORAL_TABLET | Freq: Once | ORAL | Status: DC | PRN

## 2014-11-15 MED ORDER — PROPOFOL 10 MG/ML IV BOLUS
INTRAVENOUS | Status: DC | PRN
  Administered 2014-11-15: 100 mg via INTRAVENOUS

## 2014-11-15 MED ORDER — EPHEDRINE SULFATE 50 MG/ML IJ SOLN
INTRAMUSCULAR | Status: AC
  Filled 2014-11-15: qty 1

## 2014-11-15 MED ORDER — SCOPOLAMINE 1 MG/3DAYS TD PT72
MEDICATED_PATCH | TRANSDERMAL | Status: AC
  Administered 2014-11-15: 1 via TRANSDERMAL
  Filled 2014-11-15: qty 1

## 2014-11-15 MED ORDER — MENTHOL 3 MG MT LOZG: 1.0000 | LOZENGE | OROMUCOSAL | Status: DC | PRN

## 2014-11-15 MED ORDER — ACETAMINOPHEN 325 MG PO TABS
650.0000 mg | ORAL_TABLET | Freq: Four times a day (QID) | ORAL | Status: DC | PRN
Start: 2014-11-15 — End: 2014-11-18

## 2014-11-15 MED ORDER — SUCCINYLCHOLINE CHLORIDE 20 MG/ML IJ SOLN
INTRAMUSCULAR | Status: AC
  Filled 2014-11-15: qty 1

## 2014-11-15 MED ORDER — 0.9 % SODIUM CHLORIDE (POUR BTL) OPTIME
TOPICAL | Status: DC | PRN
  Administered 2014-11-15: 1000 mL

## 2014-11-15 MED ORDER — LIDOCAINE HCL (CARDIAC) 20 MG/ML IV SOLN
INTRAVENOUS | Status: AC
  Filled 2014-11-15: qty 5

## 2014-11-15 MED ORDER — DEXTROSE 5 % IV SOLN
1.0000 g | INTRAVENOUS | Status: DC
  Administered 2014-11-15 – 2014-11-18 (×4): 1 g via INTRAVENOUS
  Filled 2014-11-15 (×4): qty 10

## 2014-11-15 MED ORDER — ONDANSETRON HCL 4 MG/2ML IJ SOLN: 4.0000 mg | Freq: Four times a day (QID) | INTRAMUSCULAR | Status: DC | PRN

## 2014-11-15 MED ORDER — OXYCODONE HCL 5 MG/5ML PO SOLN
5.0000 mg | Freq: Once | ORAL | Status: DC | PRN
Start: 2014-11-15 — End: 2014-11-15

## 2014-11-15 MED ORDER — ROCURONIUM BROMIDE 50 MG/5ML IV SOLN
INTRAVENOUS | Status: AC
  Filled 2014-11-15: qty 1

## 2014-11-15 MED ORDER — HYDROMORPHONE HCL 1 MG/ML IJ SOLN: 0.2500 mg | INTRAMUSCULAR | Status: DC | PRN

## 2014-11-15 MED ORDER — CEFAZOLIN SODIUM-DEXTROSE 2-3 GM-% IV SOLR
2.0000 g | Freq: Four times a day (QID) | INTRAVENOUS | Status: AC
  Administered 2014-11-15 (×2): 2 g via INTRAVENOUS
  Filled 2014-11-15 (×2): qty 50

## 2014-11-15 MED ORDER — STERILE WATER FOR INJECTION IJ SOLN
INTRAMUSCULAR | Status: AC
  Filled 2014-11-15: qty 10

## 2014-11-15 MED ORDER — MIDAZOLAM HCL 5 MG/5ML IJ SOLN
INTRAMUSCULAR | Status: DC | PRN
  Administered 2014-11-15: 1 mg via INTRAVENOUS

## 2014-11-15 MED ORDER — METHOCARBAMOL 1000 MG/10ML IJ SOLN
500.0000 mg | Freq: Four times a day (QID) | INTRAVENOUS | Status: DC | PRN
  Filled 2014-11-15: qty 5

## 2014-11-15 MED ORDER — PROMETHAZINE HCL 25 MG/ML IJ SOLN: 6.2500 mg | INTRAMUSCULAR | Status: DC | PRN

## 2014-11-15 MED ORDER — ACETAMINOPHEN 650 MG RE SUPP: 650.0000 mg | Freq: Four times a day (QID) | RECTAL | Status: DC | PRN

## 2014-11-15 MED ORDER — MIDAZOLAM HCL 2 MG/2ML IJ SOLN
INTRAMUSCULAR | Status: AC
  Filled 2014-11-15: qty 2

## 2014-11-15 MED ORDER — POLYETHYLENE GLYCOL 3350 17 G PO PACK: 17.0000 g | PACK | Freq: Every day | ORAL | Status: DC | PRN

## 2014-11-15 MED ORDER — LIDOCAINE HCL (CARDIAC) 20 MG/ML IV SOLN
INTRAVENOUS | Status: DC | PRN
  Administered 2014-11-15: 60 mg via INTRAVENOUS

## 2014-11-15 MED ORDER — MORPHINE SULFATE 2 MG/ML IJ SOLN: 0.5000 mg | INTRAMUSCULAR | Status: DC | PRN

## 2014-11-15 MED ORDER — ASPIRIN EC 325 MG PO TBEC
325.0000 mg | DELAYED_RELEASE_TABLET | Freq: Two times a day (BID) | ORAL | Status: DC
  Administered 2014-11-15 – 2014-11-18 (×6): 325 mg via ORAL
  Filled 2014-11-15 (×8): qty 1

## 2014-11-15 MED ORDER — INSULIN ASPART 100 UNIT/ML ~~LOC~~ SOLN
0.0000 [IU] | Freq: Three times a day (TID) | SUBCUTANEOUS | Status: DC
  Administered 2014-11-15 – 2014-11-16 (×2): 1 [IU] via SUBCUTANEOUS
  Administered 2014-11-16 (×2): 2 [IU] via SUBCUTANEOUS
  Administered 2014-11-17: 3 [IU] via SUBCUTANEOUS
  Administered 2014-11-17: 2 [IU] via SUBCUTANEOUS

## 2014-11-15 MED ORDER — FENTANYL CITRATE 0.05 MG/ML IJ SOLN
INTRAMUSCULAR | Status: DC | PRN
  Administered 2014-11-15: 50 ug via INTRAVENOUS
  Administered 2014-11-15: 100 ug via INTRAVENOUS

## 2014-11-15 MED ORDER — OXYCODONE HCL 5 MG PO TABS
5.0000 mg | ORAL_TABLET | ORAL | Status: DC | PRN
  Administered 2014-11-15 – 2014-11-17 (×9): 10 mg via ORAL
  Filled 2014-11-15 (×9): qty 2

## 2014-11-15 MED ORDER — METOCLOPRAMIDE HCL 5 MG/ML IJ SOLN: 5.0000 mg | Freq: Three times a day (TID) | INTRAMUSCULAR | Status: DC | PRN

## 2014-11-15 MED ORDER — METHOCARBAMOL 500 MG PO TABS
500.0000 mg | ORAL_TABLET | Freq: Four times a day (QID) | ORAL | Status: DC | PRN
  Administered 2014-11-15 – 2014-11-17 (×5): 500 mg via ORAL
  Filled 2014-11-15 (×5): qty 1

## 2014-11-15 SURGICAL SUPPLY — 49 items
BLADE SURG 10 STRL SS (BLADE) ×2 IMPLANT
BNDG COHESIVE 4X5 TAN STRL (GAUZE/BANDAGES/DRESSINGS) ×2 IMPLANT
BNDG GAUZE ELAST 4 BULKY (GAUZE/BANDAGES/DRESSINGS) ×2 IMPLANT
COVER PERINEAL POST (MISCELLANEOUS) ×2 IMPLANT
COVER SURGICAL LIGHT HANDLE (MISCELLANEOUS) ×2 IMPLANT
DRAPE STERI IOBAN 125X83 (DRAPES) ×2 IMPLANT
DRSG MEPILEX BORDER 4X4 (GAUZE/BANDAGES/DRESSINGS) ×2 IMPLANT
DRSG MEPILEX BORDER 4X8 (GAUZE/BANDAGES/DRESSINGS) ×2 IMPLANT
DRSG PAD ABDOMINAL 8X10 ST (GAUZE/BANDAGES/DRESSINGS) ×2 IMPLANT
DURAPREP 26ML APPLICATOR (WOUND CARE) ×2 IMPLANT
ELECT CAUTERY BLADE 6.4 (BLADE) ×2 IMPLANT
ELECT REM PT RETURN 9FT ADLT (ELECTROSURGICAL) ×2
ELECTRODE REM PT RTRN 9FT ADLT (ELECTROSURGICAL) ×1 IMPLANT
GLOVE BIO SURGEON STRL SZ8 (GLOVE) ×2 IMPLANT
GLOVE BIOGEL PI IND STRL 7.0 (GLOVE) ×3 IMPLANT
GLOVE BIOGEL PI IND STRL 8 (GLOVE) ×2 IMPLANT
GLOVE BIOGEL PI INDICATOR 7.0 (GLOVE) ×3
GLOVE BIOGEL PI INDICATOR 8 (GLOVE) ×2
GLOVE ORTHO TXT STRL SZ7.5 (GLOVE) ×2 IMPLANT
GLOVE SKINSENSE NS SZ7.0 (GLOVE) ×2
GLOVE SKINSENSE STRL SZ7.0 (GLOVE) ×2 IMPLANT
GOWN STRL REUS W/ TWL LRG LVL3 (GOWN DISPOSABLE) IMPLANT
GOWN STRL REUS W/ TWL XL LVL3 (GOWN DISPOSABLE) ×4 IMPLANT
GOWN STRL REUS W/TWL LRG LVL3 (GOWN DISPOSABLE)
GOWN STRL REUS W/TWL XL LVL3 (GOWN DISPOSABLE) ×4
GUIDE PIN 3.2 LONG (PIN) ×2 IMPLANT
GUIDE PIN 3.2MM (MISCELLANEOUS) ×1
GUIDE PIN ORTH 343X3.2XBRAD (MISCELLANEOUS) ×1 IMPLANT
HIP SCREW SET (Screw) ×2 IMPLANT
KIT BASIN OR (CUSTOM PROCEDURE TRAY) ×2 IMPLANT
KIT ROOM TURNOVER OR (KITS) ×2 IMPLANT
LINER BOOT UNIVERSAL DISP (MISCELLANEOUS) IMPLANT
MANIFOLD NEPTUNE II (INSTRUMENTS) ×2 IMPLANT
NAIL IMHS L 10X38 (Nail) ×2 IMPLANT
NEEDLE HYPO 25GX1X1/2 BEV (NEEDLE) ×2 IMPLANT
NS IRRIG 1000ML POUR BTL (IV SOLUTION) ×2 IMPLANT
PACK GENERAL/GYN (CUSTOM PROCEDURE TRAY) ×2 IMPLANT
PAD ARMBOARD 7.5X6 YLW CONV (MISCELLANEOUS) ×4 IMPLANT
SCREW COMPRESSION (Screw) ×2 IMPLANT
SCREW LAG 100MM (Screw) ×2 IMPLANT
STAPLER VISISTAT 35W (STAPLE) ×2 IMPLANT
SUT VIC AB 0 CT1 27 (SUTURE) ×1
SUT VIC AB 0 CT1 27XBRD ANBCTR (SUTURE) ×1 IMPLANT
SUT VIC AB 2-0 CT1 27 (SUTURE) ×1
SUT VIC AB 2-0 CT1 TAPERPNT 27 (SUTURE) ×1 IMPLANT
SYR CONTROL 10ML LL (SYRINGE) ×2 IMPLANT
TOWEL OR 17X24 6PK STRL BLUE (TOWEL DISPOSABLE) ×2 IMPLANT
TOWEL OR 17X26 10 PK STRL BLUE (TOWEL DISPOSABLE) ×2 IMPLANT
WATER STERILE IRR 1000ML POUR (IV SOLUTION) ×2 IMPLANT

## 2014-11-15 NOTE — Anesthesia Preprocedure Evaluation (Addendum)
Anesthesia Evaluation  Patient identified by MRN, date of birth, ID band Patient awake    Reviewed: Allergy & Precautions, H&P , NPO status , Patient's Chart, lab work & pertinent test results, reviewed documented beta blocker date and time   History of Anesthesia Complications Negative for: history of anesthetic complications  Airway Mallampati: II  TM Distance: <3 FB Neck ROM: Full    Dental  (+) Dental Advisory Given, Edentulous Upper, Poor Dentition, Missing, Partial Lower,    Pulmonary neg pulmonary ROS,    Pulmonary exam normal       Cardiovascular hypertension, Pt. on medications and Pt. on home beta blockers + CAD, + Past MI and + Cardiac Stents + dysrhythmias Supra Ventricular Tachycardia     Neuro/Psych negative neurological ROS  negative psych ROS   GI/Hepatic Neg liver ROS, PUD,   Endo/Other  diabetes, Type 2, Oral Hypoglycemic Agents, Insulin DependentMorbid obesity  Renal/GU negative Renal ROS     Musculoskeletal   Abdominal   Peds  Hematology   Anesthesia Other Findings   Reproductive/Obstetrics                          Anesthesia Physical Anesthesia Plan  ASA: III  Anesthesia Plan: General   Post-op Pain Management:    Induction: Intravenous, Rapid sequence and Cricoid pressure planned  Airway Management Planned: Oral ETT  Additional Equipment:   Intra-op Plan:   Post-operative Plan: Extubation in OR  Informed Consent: I have reviewed the patients History and Physical, chart, labs and discussed the procedure including the risks, benefits and alternatives for the proposed anesthesia with the patient or authorized representative who has indicated his/her understanding and acceptance.   Dental advisory given  Plan Discussed with: CRNA, Anesthesiologist and Surgeon  Anesthesia Plan Comments:        Anesthesia Quick Evaluation

## 2014-11-15 NOTE — Progress Notes (Signed)
[facility] Progress Note   [name] HEN:[id number] DOB: 06-02-1946 DOA: 11/14/2014 PCP: Shirline Frees, MD  Brief narrative: [name] is a 68 y.o. female past medical history of diabetes mellitus, hypertension, hyperlipidemia, CAD NSVT who tripped and fell and developed a nondisplaced intertrochanteric left proximal femur fracture.   Subjective: Evaluated after surgery-pain is controlled-no other complaints  Assessment/Plan: Principal Problem:   Intertrochanteric fracture of left hip -Status post ORIF-management per ortho  Active Problems:   Coronary atherosclerosis of native coronary artery -Cardiology has evaluated the patient and recommended to continue metoprolol and aspirin-Plavix on hold    HTN (hypertension) -BP well-controlled after surgery-continue metoprolol    UTI (urinary tract infection) -UA is contaminated-will ask for repeat-continue Rocephin for now    Type 2 diabetes mellitus without complication -Hold Actos-continue Lantus and sliding scale  Code Status: Full code Family Communication:  Disposition Plan: To be determined DVT prophylaxis: Per ortho-on aspirin 325 mg twice a day  Consultants: Ortho Cardiology  Procedures: 12/19/ORIF left hip  Antibiotics: Anti-infectives    Start     Dose/Rate Route Frequency Ordered Stop   11/15/14 1500  ceFAZolin (ANCEF) IVPB 2 g/50 mL premix     2 g100 mL/hr over 30 Minutes Intravenous Every 6 hours 11/15/14 1144 11/16/14 0259   11/15/14 0200  cefTRIAXone (ROCEPHIN) 1 g in dextrose 5 % 50 mL IVPB     1 g100 mL/hr over 30 Minutes Intravenous Every 24 hours 11/15/14 0115           Objective: Filed Weights   11/14/14 1705 11/14/14 2112  Weight: 95.255 kg (210 lb) 95.255 kg (210 lb)    Intake/Output Summary (Last 24 hours) at 11/15/14 1629 Last data filed at 11/15/14 1407  Gross per 24 hour  Intake   1180 ml  Output    400 ml  Net    780 ml     Vitals Filed Vitals:    11/15/14 1122 11/15/14 1130 11/15/14 1149 11/15/14 1403  BP: 158/70  158/67 126/56  Pulse: 73  72 71  Temp:  97.5 F (36.4 C) 97.5 F (36.4 C) 98.5 F (36.9 C)  TempSrc:    Oral  Resp:    18  Height:      Weight:      SpO2:  100% 97% 98%    Exam: General: Awake alert oriented 3, No acute respiratory distress Lungs: Clear to auscultation bilaterally without wheezes or crackles Cardiovascular: Regular rate and rhythm without murmur gallop or rub normal S1 and S2 Abdomen: Nontender, nondistended, soft, bowel sounds positive, no rebound, no ascites, no appreciable mass Extremities: No significant cyanosis, clubbing, or edema bilateral lower extremities  Data Reviewed: Basic Metabolic Panel:  Recent Labs Lab 11/14/14 1731 11/15/14 0555  NA 135* 135*  K 4.1 3.9  CL 95* 98  CO2 27 29  GLUCOSE 110* 139*  BUN 22 16  CREATININE 0.84 0.63  CALCIUM 9.5 9.1   Liver Function Tests:  Recent Labs Lab 11/14/14 2148  ALBUMIN 3.2*   No results for input(s): LIPASE, AMYLASE in the last 168 hours. No results for input(s): AMMONIA in the last 168 hours. CBC:  Recent Labs Lab 11/14/14 1731 11/15/14 0555  WBC 12.5* 7.7  NEUTROABS 9.0*  --   HGB 12.8 11.7*  HCT 39.3 35.4*  MCV 93.1 92.7  PLT 203 157   Cardiac Enzymes: No results for input(s): CKTOTAL, CKMB, CKMBINDEX, TROPONINI in the last 168 hours. BNP (last 3 results) No  results for input(s): PROBNP in the last 8760 hours. CBG:  Recent Labs Lab 11/15/14 0450 11/15/14 0738 11/15/14 0829 11/15/14 1107 11/15/14 1230  GLUCAP 133* 126* 129* 161* 170*    Recent Results (from the past 240 hour(s))  Surgical pcr screen     Status: None   Collection Time: 11/15/14  4:52 AM  Result Value Ref Range Status   MRSA, PCR NEGATIVE NEGATIVE Final   Staphylococcus aureus NEGATIVE NEGATIVE Final    Comment:        The Xpert SA Assay (FDA approved for NASAL specimens in patients over 43 years of age), is one component  of a comprehensive surveillance program.  Test performance has been validated by [employer] for patients greater than or equal to 25 year old. It is not intended to diagnose infection nor to guide or monitor treatment.      Studies:  Recent x-ray studies have been reviewed in detail by the Attending Physician  Scheduled Meds:  Scheduled Meds: . aspirin EC  325 mg Oral BID PC  .  ceFAZolin (ANCEF) IV  2 g Intravenous Q6H  . cefTRIAXone (ROCEPHIN) IVPB 1 gram/50 mL D5W  1 g Intravenous Q24H  . docusate sodium  100 mg Oral BID  . [START ON 11/16/2014] furosemide  40 mg Oral Daily  . insulin aspart  0-9 Units Subcutaneous TID WC  . insulin glargine  5 Units Subcutaneous BID  . metoprolol  50 mg Oral BID  . [START ON 11/16/2014] ramipril  10 mg Oral Q1200  . simvastatin  40 mg Oral QHS   Continuous Infusions:   Time spent on care of this patient: 30 min    [place], MD 11/15/2014, 4:29 PM  LOS: 1 day   [facility] Office  [phone] Pager - Text Page per [email or web address]  If 7PM-7AM, please contact night-coverage [email or web address]

## 2014-11-15 NOTE — Progress Notes (Signed)
Pt having short bursts of SVT .HR mostly in 60's  Vital Signs stable. Dr. Jory Sims called and updated. Pt did not have Metoprolol dose this am . Will place on Telemetry on arrival  back to 5N11 and give morning dose of Metoprolol . Dr. Purcell Nails to round on patient shortly

## 2014-11-15 NOTE — Brief Op Note (Signed)
11/14/2014 - 11/15/2014  9:57 AM  PATIENT:  [name]  68 y.o. female  PRE-OPERATIVE DIAGNOSIS:  HIP FRACTURE LEFT  POST-OPERATIVE DIAGNOSIS:  HIP FRACTURE LEFT  PROCEDURE:  Procedure(s): OPEN REDUCTION INTERNAL FIXATION HIP INTERTROCH (Left)  SURGEON:  Surgeon(s) and Role:    * Mcarthur Rossetti, MD - Primary  PHYSICIAN ASSISTANT: Benita Stabile, PA-C  ANESTHESIA:   general  EBL:  Total I/O In: 700 [I.V.:700] Out: 100 [Blood:100]  BLOOD ADMINISTERED:none  DRAINS: none   LOCAL MEDICATIONS USED:  NONE  SPECIMEN:  No Specimen  DISPOSITION OF SPECIMEN:  N/A  COUNTS:  YES  TOURNIQUET:  * No tourniquets in log *  DICTATION: .Other Dictation: Dictation Number [id number]  PLAN OF CARE: Admit to inpatient   PATIENT DISPOSITION:  PACU - hemodynamically stable.   Delay start of Pharmacological VTE agent (>24hrs) due to surgical blood loss or risk of bleeding: no

## 2014-11-15 NOTE — Op Note (Signed)
NAMETHEO, REITHER NO.:  [id number]  MEDICAL RECORD NO.:  [id number]  LOCATION:  5N11C                        FACILITY:  [facility]  PHYSICIAN:  Lind Guest. Ninfa Linden, M.D.DATE OF BIRTH:  July 05, 1946  DATE OF PROCEDURE:  11/15/2014 DATE OF DISCHARGE:                              OPERATIVE REPORT   PREOPERATIVE DIAGNOSIS:  Left intertrochanteric hip fracture, displaced initial encounter and closed.  POSTOPERATIVE DIAGNOSIS:  Left intertrochanteric hip fracture, displaced initial encounter and closed.  PROCEDURE:  Open reduction internal fixation of left intertrochanteric hip fracture.  IMPLANTS:  [employer] 10 x 38 left intramedullary rod with 100 mm lag screw Tamala [employer] IMHS).  SURGEON:  Lind Guest. Ninfa Linden, M.D.  ASSISTANT:  Erskine Emery, PA-C  ANESTHESIA:  General.  ANTIBIOTICS:  2 g IV Ancef.  BLOOD LOSS:  100 mL.  COMPLICATIONS:  None.  INDICATIONS:  Ms. Keiffer is a very pleasant 68 year old female with several medical problems, who sustained an accidental mechanical fall yesterday in her kitchen.  She was seen last night at [facility] and found to have a left intertrochanteric hip fracture.  It was recommended she undergo open reduction internal fixation.  She was graciously admitted to the Medicine service due to her medical issues. She has been experiencing some nausea this morning, but he does understand the risks and benefits of surgery.  She understands the goals of this are getting her mobilizing and moving as quickly as possible to offset the risk of blood clots, pneumonia, DVT, and bedsores.  PROCEDURE DESCRIPTION:  After informed consent was obtained, appropriate left hip was marked. She was brought to the operating room.  General anesthesia was obtained while she was on her stretcher.  She was next placed supine on the fracture table with her eft operative leg in in- line skeletal traction.  Perineal post  in place.  Her right nonoperative hip flexion abduction was __________ with appropriate padding.  Her left hip was then assessed under direct fluoroscopy and with traction internal rotation, we were able to reduce the fracture near anatomically.  We then prepped the left hip with DuraPrep and sterile drapes.  A time-out was called.  She was identified as correct patient, correct left hip.  We then made an incision proximal to the greater trochanter and dissected down the tip of greater trochanter.  We placed a guide pin in antegrade format to the tip of the greater trochanter down the level of lesser trochanter.  I then used initiating reamer to open up the femoral canal.  We had already checked our femoral nail sterilely in the box before prepping and draping and I had already pre- chosen a 10 x 38 left intramedullary rod.  We did not need to ream and placed this easily down the femoral canal without difficulty.  Using the outrigger guide from this, we made a separate incision in lateral thigh and we were able to then place a guide pin from the lateral cortex of the femur traversing the fracture into a near center-center position of the femoral head.  We took a measurement off this and then drilled for the depth of 100 mm lag screw.  We  placed this without difficulty and then the compression component once we let traction off the leg.  This completely stabilized the fracture.  We then moved the outrigger guide and all instrumentation.  We closed the 2 wounds with 0 Vicryl in deep tissue, 0 Vicryl in subcutaneous tissue, and staples on the skin. Sterile dressing was applied.  She was then taken off the fracture table, awakened, extubated, and taken to recovery in stable condition. All final counts were correct and no complications noted.  Of note, Erskine Emery, PAC assisted in the entire case and his assistance was crucial for facilitating all aspects of this case.     Lind Guest.  Ninfa Linden, M.D.     CYB/MEDQ  D:  11/15/2014  T:  11/15/2014  Job:  [id number]

## 2014-11-15 NOTE — Progress Notes (Signed)
Pt requested that her husband's belongings be brought to her. " All of his children's numbers are in his cell phone, it is very important that I get it if you can."   Chaplain informed family that nurses will fwd information to them. Chaplain headed to retrieve husband's belongings.   Delford Field, Chaplain 11/15/2014

## 2014-11-15 NOTE — Transfer of Care (Signed)
Immediate Anesthesia Transfer of Care Note  Patient: [name]  Procedure(s) Performed: Procedure(s): OPEN REDUCTION INTERNAL FIXATION HIP INTERTROCH (Left)  Patient Location: PACU  Anesthesia Type:General  Level of Consciousness: awake and alert   Airway & Oxygen Therapy: Patient Spontanous Breathing and Patient connected to nasal cannula oxygen  Post-op Assessment: Report given to PACU RN, Post -op Vital signs reviewed and stable and Patient moving all extremities X 4  Post vital signs: Reviewed and stable  Complications: No apparent anesthesia complications

## 2014-11-15 NOTE — Anesthesia Postprocedure Evaluation (Signed)
Anesthesia Post Note  Patient: [name]  Procedure(s) Performed: Procedure(s) (LRB): OPEN REDUCTION INTERNAL FIXATION HIP INTERTROCH (Left)  Anesthesia type: general  Patient location: PACU  Post pain: Pain level controlled  Post assessment: Patient's Cardiovascular Status Stable  Last Vitals:  Filed Vitals:   11/15/14 1130  BP:   Pulse:   Temp: 36.4 C  Resp:     Post vital signs: Reviewed and stable  Level of consciousness: sedated  Complications: No apparent anesthesia complications

## 2014-11-15 NOTE — Progress Notes (Signed)
Patient ID: [name], female   DOB: 1946/01/21, 68 y.o.   MRN: [id number] Ms. Lobdell understands fully that we will be proceeding to the OR this am for open reduction/internal fixation of her left hip fracture.  She gives informed consent.  She does have nausea this am.

## 2014-11-15 NOTE — Anesthesia Procedure Notes (Signed)
Procedure Name: Intubation Date/Time: 11/15/2014 9:04 AM Performed by: Garrison Columbus T Pre-anesthesia Checklist: Patient identified, Emergency Drugs available, Suction available and Patient being monitored Patient Re-evaluated:Patient Re-evaluated prior to inductionOxygen Delivery Method: Circle system utilized Preoxygenation: Pre-oxygenation with 100% oxygen Intubation Type: IV induction and Rapid sequence Laryngoscope Size: Miller and 2 Grade View: Grade I Tube type: Oral Tube size: 7.5 mm Number of attempts: 1 Airway Equipment and Method: Stylet Placement Confirmation: ETT inserted through vocal cords under direct vision,  positive ETCO2 and breath sounds checked- equal and bilateral Secured at: 21 cm Tube secured with: Tape Dental Injury: Teeth and Oropharynx as per pre-operative assessment

## 2014-11-15 NOTE — Progress Notes (Signed)
Chaplain accompanied pt sister as she updated the pt on her husband's status. Family asked that information be routed to them as they are bedside comforting pt's wife.   Will follow as needed.   Delford Field, Chaplain 11/15/2014

## 2014-11-15 NOTE — Progress Notes (Signed)
Brought Mr. Juncaj belongings to wife as requested.   Delford Field, Chaplain 11/15/2014

## 2014-11-15 NOTE — Progress Notes (Signed)
Orthopedic Tech Progress Note Patient Details:  [name] 1946-09-21 [id number] Applied OHF with trapeze to pt.'s bed.     Darrol Poke 11/15/2014, 8:02 PM

## 2014-11-15 NOTE — Progress Notes (Signed)
SUBJECTIVE:  No CP.   No problems with exertion prior to presenting to the hospital. She had been walking around without issues.  OBJECTIVE:   Vitals:   Filed Vitals:   11/14/14 2112 11/15/14 0000 11/15/14 0400 11/15/14 0448  BP: 146/58   143/54  Pulse: 69   60  Temp: 98.1 F (36.7 C)   98.3 F (36.8 C)  TempSrc: Oral   Oral  Resp: 16 16 16 16   Height: 5\' 8"  (1.727 m)     Weight: 210 lb (95.255 kg)     SpO2: 100% 100% 100% 100%   I&O's:   Intake/Output Summary (Last 24 hours) at 11/15/14 0901 Last data filed at 11/15/14 0210  Gross per 24 hour  Intake    240 ml  Output      0 ml  Net    240 ml   TELEMETRY: Reviewed telemetry pt in NSR:     PHYSICAL EXAM General: Well developed, well nourished, in no acute distress Head:   Normal cephalic and atramatic  Lungs:  No wheezing Heart:   HRRR S1 S2  No JVD.   Abdomen: abdomen soft and non-tender Msk:  B  Normal strength and tone for age. Extremities: No edema.   Neuro: Alert and oriented. Psych:  Normal affect, responds appropriately Skin: No rash   LABS: Basic Metabolic Panel:  Recent Labs  11/14/14 1731 11/15/14 0555  NA 135* 135*  K 4.1 3.9  CL 95* 98  CO2 27 29  GLUCOSE 110* 139*  BUN 22 16  CREATININE 0.84 0.63  CALCIUM 9.5 9.1   Liver Function Tests:  Recent Labs  11/14/14 2148  ALBUMIN 3.2*   No results for input(s): LIPASE, AMYLASE in the last 72 hours. CBC:  Recent Labs  11/14/14 1731 11/15/14 0555  WBC 12.5* 7.7  NEUTROABS 9.0*  --   HGB 12.8 11.7*  HCT 39.3 35.4*  MCV 93.1 92.7  PLT 203 157   Cardiac Enzymes: No results for input(s): CKTOTAL, CKMB, CKMBINDEX, TROPONINI in the last 72 hours. BNP: Invalid input(s): POCBNP D-Dimer: No results for input(s): DDIMER in the last 72 hours. Hemoglobin A1C:  Recent Labs  11/14/14 2148  HGBA1C 6.2*   Fasting Lipid Panel: No results for input(s): CHOL, HDL, LDLCALC, TRIG, CHOLHDL, LDLDIRECT in the last 72 hours. Thyroid  Function Tests: No results for input(s): TSH, T4TOTAL, T3FREE, THYROIDAB in the last 72 hours.  Invalid input(s): FREET3 Anemia Panel: No results for input(s): VITAMINB12, FOLATE, FERRITIN, TIBC, IRON, RETICCTPCT in the last 72 hours. Coag Panel:   Lab Results  Component Value Date   INR 1.06 11/12/2013   INR 1.15 01/07/2013   INR 0.97 12/26/2012    RADIOLOGY: Dg Hip Complete Left  11/14/2014   CLINICAL DATA:  Patient status post fall. Left hip pain. Initial encounter.  EXAM: LEFT HIP - COMPLETE 2+ VIEW  COMPARISON:  None.  FINDINGS: There is suggestion of a nondisplaced intertrochanteric left proximal femur fracture. The left hip appears located. Cross-table lateral views are limited. SI joints are unremarkable.  IMPRESSION: Findings suggestive of nondisplaced intertrochanteric left proximal femur fracture.   Electronically Signed   By: Lovey Newcomer M.D.   On: 11/14/2014 18:28   Dg Chest Port 1 View  11/14/2014   CLINICAL DATA:  Preop LEFT hip surgery.  No chest complaints.  EXAM: PORTABLE CHEST - 1 VIEW  COMPARISON:  11/12/2013  FINDINGS: Patient is rotated partially due to the orthopedic injury.  Cardiomegaly. Calcified  tortuous aorta. No definite infiltrates or failure. Osteopenia.  IMPRESSION: Cardiomegaly, no active disease.   Electronically Signed   By: Rolla Flatten M.D.   On: 11/14/2014 19:27      ASSESSMENT: History of CAD  PLAN:  No further testing required before surgery. Spoke with Dr. Tobias Alexander regarding anesthesia type. Would not place any limitations on her from a cardiac standpoint. It has been almost 2 years since her stenting was performed. Will follow.  Jettie Booze, MD  11/15/2014  9:01 AM

## 2014-11-16 DIAGNOSIS — S72142D Displaced intertrochanteric fracture of left femur, subsequent encounter for closed fracture with routine healing: Secondary | ICD-10-CM

## 2014-11-16 DIAGNOSIS — N390 Urinary tract infection, site not specified: Secondary | ICD-10-CM

## 2014-11-16 LAB — BASIC METABOLIC PANEL
Anion gap: 13 (ref 5–15)
BUN: 13 mg/dL (ref 6–23)
CHLORIDE: 98 meq/L (ref 96–112)
CO2: 26 mEq/L (ref 19–32)
CREATININE: 0.74 mg/dL (ref 0.50–1.10)
Calcium: 8.8 mg/dL (ref 8.4–10.5)
GFR calc non Af Amer: 84 mL/min — ABNORMAL LOW (ref >90)
Glucose, Bld: 149 mg/dL — ABNORMAL HIGH (ref 70–99)
Potassium: 3.8 mEq/L (ref 3.7–5.3)
SODIUM: 137 meq/L (ref 137–147)

## 2014-11-16 LAB — GLUCOSE, CAPILLARY
GLUCOSE-CAPILLARY: 143 mg/dL — AB (ref 70–99)
Glucose-Capillary: 189 mg/dL — ABNORMAL HIGH (ref 70–99)
Glucose-Capillary: 171 mg/dL — ABNORMAL HIGH (ref 70–99)
Glucose-Capillary: 169 mg/dL — ABNORMAL HIGH (ref 70–99)

## 2014-11-16 LAB — CBC
HCT: 34.9 % — ABNORMAL LOW (ref 36.0–46.0)
Hemoglobin: 11.5 g/dL — ABNORMAL LOW (ref 12.0–15.0)
MCH: 31.4 pg (ref 26.0–34.0)
MCHC: 33 g/dL (ref 30.0–36.0)
MCV: 95.4 fL (ref 78.0–100.0)
PLATELETS: 140 10*3/uL — AB (ref 150–400)
RBC: 3.66 MIL/uL — ABNORMAL LOW (ref 3.87–5.11)
RDW: 13.3 % (ref 11.5–15.5)
WBC: 8.5 10*3/uL (ref 4.0–10.5)

## 2014-11-16 MED ORDER — ASPIRIN 325 MG PO TBEC: 325.0000 mg | DELAYED_RELEASE_TABLET | Freq: Two times a day (BID) | ORAL | Status: DC

## 2014-11-16 MED ORDER — OXYCODONE-ACETAMINOPHEN 5-325 MG PO TABS: 1.0000 | ORAL_TABLET | ORAL | Status: DC | PRN

## 2014-11-16 MED ORDER — METOPROLOL TARTRATE 50 MG PO TABS
75.0000 mg | ORAL_TABLET | Freq: Two times a day (BID) | ORAL | Status: DC
  Administered 2014-11-16 – 2014-11-18 (×5): 75 mg via ORAL
  Filled 2014-11-16 (×6): qty 1

## 2014-11-16 NOTE — Progress Notes (Signed)
Cardiologist called back stated he will look a patients strips located in [place]. Nursing will continue to monitor. New set of vitals entered by NT.

## 2014-11-16 NOTE — Progress Notes (Signed)
[facility] Progress Note   [name] BWG:[id number] DOB: Jun 03, 1946 DOA: 11/14/2014 PCP: Shirline Frees, MD  Brief narrative: [name] is a 68 y.o. female past medical history of diabetes mellitus, hypertension, hyperlipidemia, CAD NSVT who tripped and fell and developed a nondisplaced intertrochanteric left proximal femur fracture.   Subjective: Alert, no complaints.   Assessment/Plan: Principal Problem:   Intertrochanteric fracture of left hip -Status post ORIF-management per ortho  Active Problems:   Coronary atherosclerosis of native coronary artery -Cardiology has evaluated the patient and recommended to continue metoprolol and aspirin -Plavix on hold but per cardiology, we can resume it when Ortho advises. Currently on BID ASA 325 per ortho    HTN (hypertension) -BP well-controlled after surgery-continue metoprolol    UTI (urinary tract infection) -UA is contaminated- repeat UA requested but also contaminated -continue Rocephin for now    Type 2 diabetes mellitus without complication -Hold Actos-continue Lantus and sliding scale- sugars stable  Code Status: Full code Family Communication:  Disposition Plan: SNF recommended  DVT prophylaxis: Per ortho-on aspirin 325 mg twice a day  Consultants: Ortho Cardiology  Procedures: 12/19/ORIF left hip  Antibiotics: Anti-infectives    Start     Dose/Rate Route Frequency Ordered Stop   11/15/14 1500  ceFAZolin (ANCEF) IVPB 2 g/50 mL premix     2 g100 mL/hr over 30 Minutes Intravenous Every 6 hours 11/15/14 1144 11/15/14 2142   11/15/14 0200  cefTRIAXone (ROCEPHIN) 1 g in dextrose 5 % 50 mL IVPB     1 g100 mL/hr over 30 Minutes Intravenous Every 24 hours 11/15/14 0115       Objective: Filed Weights   11/14/14 1705 11/14/14 2112  Weight: 95.255 kg (210 lb) 95.255 kg (210 lb)    Intake/Output Summary (Last 24 hours) at 11/16/14 1145 Last data filed at 11/16/14 0800  Gross per 24 hour   Intake   1320 ml  Output   1450 ml  Net   -130 ml     Vitals Filed Vitals:   11/15/14 1149 11/15/14 1403 11/15/14 1957 11/16/14 0535  BP: 158/67 126/56 104/84 121/60  Pulse: 72 71 91 81  Temp: 97.5 F (36.4 C) 98.5 F (36.9 C) 98.1 F (36.7 C) 98.4 F (36.9 C)  TempSrc:  Oral Oral Oral  Resp:  18 18 18   Height:      Weight:      SpO2: 97% 98% 98% 97%    Exam: General: Awake alert oriented 3, No acute respiratory distress Lungs: Clear to auscultation bilaterally without wheezes or crackles Cardiovascular: Regular rate and rhythm without murmur gallop or rub normal S1 and S2 Abdomen: Nontender, nondistended, soft, bowel sounds positive, no rebound, no ascites, no appreciable mass Extremities: No significant cyanosis, clubbing, or edema bilateral lower extremities  Data Reviewed: Basic Metabolic Panel:  Recent Labs Lab 11/14/14 1731 11/15/14 0555 11/16/14 0310  NA 135* 135* 137  K 4.1 3.9 3.8  CL 95* 98 98  CO2 27 29 26   GLUCOSE 110* 139* 149*  BUN 22 16 13   CREATININE 0.84 0.63 0.74  CALCIUM 9.5 9.1 8.8   Liver Function Tests:  Recent Labs Lab 11/14/14 2148  ALBUMIN 3.2*   No results for input(s): LIPASE, AMYLASE in the last 168 hours. No results for input(s): AMMONIA in the last 168 hours. CBC:  Recent Labs Lab 11/14/14 1731 11/15/14 0555 11/16/14 0310  WBC 12.5* 7.7 8.5  NEUTROABS 9.0*  --   --   HGB 12.8 11.7*  11.5*  HCT 39.3 35.4* 34.9*  MCV 93.1 92.7 95.4  PLT 203 157 140*   Cardiac Enzymes: No results for input(s): CKTOTAL, CKMB, CKMBINDEX, TROPONINI in the last 168 hours. BNP (last 3 results) No results for input(s): PROBNP in the last 8760 hours. CBG:  Recent Labs Lab 11/15/14 1107 11/15/14 1230 11/15/14 1640 11/15/14 2210 11/16/14 0623  GLUCAP 161* 170* 127* 147* 143*    Recent Results (from the past 240 hour(s))  Surgical pcr screen     Status: None   Collection Time: 11/15/14  4:52 AM  Result Value Ref Range Status    MRSA, PCR NEGATIVE NEGATIVE Final   Staphylococcus aureus NEGATIVE NEGATIVE Final    Comment:        The Xpert SA Assay (FDA approved for NASAL specimens in patients over 84 years of age), is one component of a comprehensive surveillance program.  Test performance has been validated by [employer] for patients greater than or equal to 87 year old. It is not intended to diagnose infection nor to guide or monitor treatment.      Studies:  Recent x-ray studies have been reviewed in detail by the Attending Physician  Scheduled Meds:  Scheduled Meds: . aspirin EC  325 mg Oral BID PC  . cefTRIAXone (ROCEPHIN) IVPB 1 gram/50 mL D5W  1 g Intravenous Q24H  . docusate sodium  100 mg Oral BID  . furosemide  40 mg Oral Daily  . insulin aspart  0-9 Units Subcutaneous TID WC  . insulin glargine  5 Units Subcutaneous BID  . metoprolol  75 mg Oral BID  . ramipril  10 mg Oral Q1200  . simvastatin  40 mg Oral QHS   Continuous Infusions:   Time spent on care of this patient: 30 min    [place], MD 11/16/2014, 11:45 AM  LOS: 2 days   [facility] Office  [phone] Pager - Text Page per [email or web address]  If 7PM-7AM, please contact night-coverage [email or web address]

## 2014-11-16 NOTE — Progress Notes (Signed)
Patient running SVT on telemetry monitor. [facility] answering service paged. Nursing will continue to monitor.

## 2014-11-16 NOTE — Progress Notes (Signed)
Subjective: 1 Day Post-Op Procedure(s) (LRB): OPEN REDUCTION INTERNAL FIXATION HIP INTERTROCH (Left) Patient reports pain as mild to moderate.   No complaints otherwise. Objective: Vital signs in last 24 hours: Temp:  [97.5 F (36.4 C)-98.5 F (36.9 C)] 98.4 F (36.9 C) (12/20 0535) Pulse Rate:  [70-91] 81 (12/20 0535) Resp:  [10-18] 18 (12/20 0535) BP: (104-158)/(56-94) 121/60 mmHg (12/20 0535) SpO2:  [96 %-100 %] 97 % (12/20 0535)  Intake/Output from previous day: 12/19 0701 - 12/20 0700 In: 1780 [P.O.:1080; I.V.:700] Out: 1750 [Urine:1650; Blood:100] Intake/Output this shift: Total I/O In: 240 [P.O.:240] Out: 0    Recent Labs  11/14/14 1731 11/15/14 0555 11/16/14 0310  HGB 12.8 11.7* 11.5*    Recent Labs  11/15/14 0555 11/16/14 0310  WBC 7.7 8.5  RBC 3.82* 3.66*  HCT 35.4* 34.9*  PLT 157 140*    Recent Labs  11/15/14 0555 11/16/14 0310  NA 135* 137  K 3.9 3.8  CL 98 98  CO2 29 26  BUN 16 13  CREATININE 0.63 0.74  GLUCOSE 139* 149*  CALCIUM 9.1 8.8   No results for input(s): LABPT, INR in the last 72 hours.  Left hip: Sensation intact distally Intact pulses distally Dorsiflexion/Plantar flexion intact Incision: scant drainage Compartment soft  Right wrist slight erythema near iv site without warmth, also impressions of pulse ox and IV tubing  Assessment/Plan: 1 Day Post-Op Procedure(s) (LRB): OPEN REDUCTION INTERNAL FIXATION HIP INTERTROCH (Left) Up with therapy WBAT left hip Monitor IV site may have to remove ( however patient reports difficult stick and still needs IV access) Patient may leave floor from ortho standpoint in wheel chair to go visit husband who had a MI yesterday after patients surgery  CLARK, [place] 11/16/2014, 9:27 AM

## 2014-11-16 NOTE — Progress Notes (Signed)
SUBJECTIVE:  No CP.   No palpitations.  OBJECTIVE:   Vitals:   Filed Vitals:   11/15/14 1149 11/15/14 1403 11/15/14 1957 11/16/14 0535  BP: 158/67 126/56 104/84 121/60  Pulse: 72 71 91 81  Temp: 97.5 F (36.4 C) 98.5 F (36.9 C) 98.1 F (36.7 C) 98.4 F (36.9 C)  TempSrc:  Oral Oral Oral  Resp:  18 18 18   Height:      Weight:      SpO2: 97% 98% 98% 97%   I&O's:    Intake/Output Summary (Last 24 hours) at 11/16/14 0816 Last data filed at 11/16/14 0536  Gross per 24 hour  Intake   1780 ml  Output   1550 ml  Net    230 ml   TELEMETRY: Reviewed telemetry pt in NSR with intermittent short bursts of SVT:     PHYSICAL EXAM General: Well developed, well nourished, in no acute distress Head:   Normal cephalic and atramatic  Lungs:  No wheezing Heart:   HRRR S1 S2  No JVD.   Abdomen: abdomen soft and non-tender  Extremities: No edema.   Neuro: Alert and oriented. Psych:  Normal affect, responds appropriately Skin: No rash   LABS: Basic Metabolic Panel:  Recent Labs  11/15/14 0555 11/16/14 0310  NA 135* 137  K 3.9 3.8  CL 98 98  CO2 29 26  GLUCOSE 139* 149*  BUN 16 13  CREATININE 0.63 0.74  CALCIUM 9.1 8.8   Liver Function Tests:  Recent Labs  11/14/14 2148  ALBUMIN 3.2*   No results for input(s): LIPASE, AMYLASE in the last 72 hours. CBC:  Recent Labs  11/14/14 1731 11/15/14 0555 11/16/14 0310  WBC 12.5* 7.7 8.5  NEUTROABS 9.0*  --   --   HGB 12.8 11.7* 11.5*  HCT 39.3 35.4* 34.9*  MCV 93.1 92.7 95.4  PLT 203 157 140*   Cardiac Enzymes: No results for input(s): CKTOTAL, CKMB, CKMBINDEX, TROPONINI in the last 72 hours. BNP: Invalid input(s): POCBNP D-Dimer: No results for input(s): DDIMER in the last 72 hours. Hemoglobin A1C:  Recent Labs  11/14/14 2148  HGBA1C 6.2*   Fasting Lipid Panel: No results for input(s): CHOL, HDL, LDLCALC, TRIG, CHOLHDL, LDLDIRECT in the last 72 hours. Thyroid Function Tests: No results for  input(s): TSH, T4TOTAL, T3FREE, THYROIDAB in the last 72 hours.  Invalid input(s): FREET3 Anemia Panel: No results for input(s): VITAMINB12, FOLATE, FERRITIN, TIBC, IRON, RETICCTPCT in the last 72 hours. Coag Panel:   Lab Results  Component Value Date   INR 1.06 11/12/2013   INR 1.15 01/07/2013   INR 0.97 12/26/2012    RADIOLOGY: Dg Hip Complete Left  11/14/2014   CLINICAL DATA:  Patient status post fall. Left hip pain. Initial encounter.  EXAM: LEFT HIP - COMPLETE 2+ VIEW  COMPARISON:  None.  FINDINGS: There is suggestion of a nondisplaced intertrochanteric left proximal femur fracture. The left hip appears located. Cross-table lateral views are limited. SI joints are unremarkable.  IMPRESSION: Findings suggestive of nondisplaced intertrochanteric left proximal femur fracture.   Electronically Signed   By: Lovey Newcomer M.D.   On: 11/14/2014 18:28   Dg Femur Left  11/15/2014   CLINICAL DATA:  Patient status post ORIF left hip.  EXAM: DG C-ARM 61-120 MIN; LEFT FEMUR - 2 VIEW  FLUOROSCOPY TIME:  30 seconds  COMPARISON:  Hip radiograph 11/14/2014  FINDINGS: Patient status post ORIF left intertrochanteric proximal femur fracture. Hardware is visualized on multiple fluoroscopic  images.  IMPRESSION: Status post ORIF left intertrochanteric proximal femur fracture.   Electronically Signed   By: Lovey Newcomer M.D.   On: 11/15/2014 11:01   Dg Chest Port 1 View  11/14/2014   CLINICAL DATA:  Preop LEFT hip surgery.  No chest complaints.  EXAM: PORTABLE CHEST - 1 VIEW  COMPARISON:  11/12/2013  FINDINGS: Patient is rotated partially due to the orthopedic injury.  Cardiomegaly. Calcified tortuous aorta. No definite infiltrates or failure. Osteopenia.  IMPRESSION: Cardiomegaly, no active disease.   Electronically Signed   By: Rolla Flatten M.D.   On: 11/14/2014 19:27   Dg C-arm 1-60 Min  11/15/2014   CLINICAL DATA:  Patient status post ORIF left hip.  EXAM: DG C-ARM 61-120 MIN; LEFT FEMUR - 2 VIEW   FLUOROSCOPY TIME:  30 seconds  COMPARISON:  Hip radiograph 11/14/2014  FINDINGS: Patient status post ORIF left intertrochanteric proximal femur fracture. Hardware is visualized on multiple fluoroscopic images.  IMPRESSION: Status post ORIF left intertrochanteric proximal femur fracture.   Electronically Signed   By: Lovey Newcomer M.D.   On: 11/15/2014 11:01      ASSESSMENT: History of CAD  PLAN:  Tolerated surgery.  SVT: Increase metoprolol to 75 mg BID.  CAD: Restart plavix 75 mg daily when safe from a bleeding standpoint.  No angina.  H/o RCA stent placement.    Jettie Booze, MD  11/16/2014  8:16 AM

## 2014-11-16 NOTE — Clinical Social Work Placement (Addendum)
    Clinical Social Work Department CLINICAL SOCIAL WORK PLACEMENT NOTE 11/16/2014  Patient:  [name], [name]  Account Number:  [id number] Admit date:  11/14/2014  Clinical Social Worker:  Adair Laundry  Date/time:  11/16/2014 02:54 PM  Clinical Social Work is seeking post-discharge placement for this patient at the following level of care:   SKILLED NURSING   (*CSW will update this form in Epic as items are completed)   11/16/2014  Patient/family provided with [place] Department of Clinical Social Work's list of facilities offering this level of care within the geographic area requested by the patient (or if unable, by the patient's family).  11/16/2014  Patient/family informed of their freedom to choose among providers that offer the needed level of care, that participate in Medicare, Medicaid or managed care program needed by the patient, have an available bed and are willing to accept the patient.  11/16/2014  Patient/family informed of [facility]' ownership interest in [facility], as well as of the fact that they are under no obligation to receive care at this facility.  PASARR submitted to EDS on Existing PASARR number received on   FL2 transmitted to all facilities in geographic area requested by pt/family on  11/16/2014 FL2 transmitted to all facilities within larger geographic area on   Patient informed that his/her managed care company has contracts with or will negotiate with  certain facilities, including the following:     Patient/family informed of bed offers received:  11/17/2014 [facility] Sayres, Latanya Presser) Patient chooses bed at [place] L[facility] Sayres, [place]) Physician recommends and patient chooses bed at    Patient to be transferred to  [place] on 11/18/2014 [facility] Sayres, [facility]) Patient to be transferred to facility by [facility] Sayres, [place]) Patient and family notified of transfer on 11/18/2014 Lubertha Sayres, Latanya Presser) Name of family member notified:  Pt updated at bedside. [place])  The following physician request were entered in Epic: Physician Request  Please sign FL2.    Additional Comments:    Adair Laundry Weekend CSW [phone]  [facility] Sayres, [place] [phone]) Licensed Clinical Social Worker Orthopedics [phone]) and Surgical [phone])

## 2014-11-16 NOTE — Progress Notes (Signed)
OT Cancellation Note  Patient Details Name: [name] MRN: [id number] DOB: [birth year]   Cancelled Treatment:    Reason Eval/Treat Not Completed: OT screened. Pt's current D/C plan is SNF. No apparent immediate acute care OT needs, therefore will defer OT to SNF. If OT eval is needed please call Acute Rehab Dept. at [phone] or text page OT at [phone].    Benito Mccreedy OTR/L [phone] 11/16/2014, 11:16 AM

## 2014-11-16 NOTE — Clinical Social Work Psychosocial (Signed)
     Clinical Social Work Department BRIEF PSYCHOSOCIAL ASSESSMENT 11/16/2014  Patient:  [name], [name]     Account Number:  [id number]     Admit date:  11/14/2014  Clinical Social Worker:  Adair Laundry  Date/Time:  11/16/2014 02:35 PM  Referred by:  Physician  Date Referred:  11/16/2014 Referred for  SNF Placement   Other Referral:   Interview type:  Patient Other interview type:    PSYCHOSOCIAL DATA Living Status:  HUSBAND Admitted from facility:   Level of care:   Primary support name:  [name] Primary support relationship to patient:  SIBLING Degree of support available:   Pt has good family and friend support    CURRENT CONCERNS Current Concerns  Post-Acute Placement   Other Concerns:    SOCIAL WORK ASSESSMENT / PLAN CSW visited pt room and spoke to her about SNF recommendation. Pt informed CSW she was very thankful for CSW visit because she knew she would need something at discharge. Pt informed CSW that her husband is also in the hospital and it has been a very stressful time for her. CSW offered support. Pt informed CSW she has not been to SNF before but her husband has. Pt husband was at [facility] and she would prefer not to dc to this facility. CSW explained SNF referral process and pt is agreeable to referral being sent to all facilities in [facility] except above mentioned. Pt did state a preference for [facility]. Pt also hopeful that if her husband needs SNF at dc and she is still at facility he can dc to the same facility.   Assessment/plan status:  Psychosocial Support/Ongoing Assessment of Needs Other assessment/ plan:   Information/referral to community resources:   SNF list to be provided with bed offers    PATIENTS/FAMILYS RESPONSE TO PLAN OF CARE: Pt is agreeable to SNF at dc.    [place], LCSWA  Weekend CSW  [phone]

## 2014-11-16 NOTE — Progress Notes (Signed)
Removed IV from right arm, due to infiltration. Ice pack applied. Will continue to monitor patient.

## 2014-11-16 NOTE — Evaluation (Signed)
Physical Therapy Evaluation Patient Details Name: [name] MRN: [id number] DOB: 1946/10/23 Today's Date: 11/16/2014   History of Present Illness  Admitted post fall in kitchen resulting in L hip fx; now s/p ORIF, WBAT  Past Medical History  Diagnosis Date  . Diabetes mellitus   . Hypertension   . Hypercholesteremia   . CAD (coronary artery disease)     a. NSTEMI in setting of UTI and SVT 12/2012 => LHC 01/08/13: Proximal LAD 30%, ostial diagonal 30-40%, proximal RCA 95%, inferior AK, EF 45-50% => PCI: Promus DES x 2 to RCA;  b. Echo 01/05/13: EF 81-15%, grade 1 diastolic dysfunction, MAC   . SVT (supraventricular tachycardia)   . Rectal ulceration     colonoscopy 12/2012   Past Surgical History  Procedure Laterality Date  . Eye surgery      laser , cat bil  . Tubal ligation    . Kyphoplasty  12/27/2012    Procedure: KYPHOPLASTY;  Surgeon: Sinclair Ship, MD;  Location: [facility] [place];  Service: Orthopedics;  Laterality: Bilateral;  T-8 kyphoplasty  . Colonoscopy Left 01/12/2013    Procedure: COLONOSCOPY;  Surgeon: Wonda Horner, MD;  Location: [facility] ENDOSCOPY;  Service: Endoscopy;  Laterality: Left;  . Colonoscopy N/A 01/14/2013    Procedure: COLONOSCOPY;  Surgeon: Wonda Horner, MD;  Location: [facility] ENDOSCOPY;  Service: Endoscopy;  Laterality: N/A;  Rm 2034, attemped Colon on 2-15=inadequate prep  . Coronary angioplasty  12/2012  . Left heart catheterization with coronary angiogram N/A 01/08/2013    Procedure: LEFT HEART CATHETERIZATION WITH CORONARY ANGIOGRAM;  Surgeon: Thayer Headings, MD;  Location: [facility] CATH LAB;  Service: Cardiovascular;  Laterality: N/A;  . Percutaneous stent intervention N/A 01/15/2013    Procedure: PERCUTANEOUS STENT INTERVENTION;  Surgeon: Peter M Martinique, MD;  Location: [facility] CATH LAB;  Service: Cardiovascular;  Laterality: N/A;     Clinical Impression  Patient is s/p above surgery resulting in functional limitations due to the deficits listed below (see PT Problem  List).  Patient will benefit from skilled PT to increase their independence and safety with mobility to allow discharge to the venue listed below.       Follow Up Recommendations SNF    Equipment Recommendations  Rolling walker with 5" wheels;3in1 (PT)    Recommendations for Other Services OT consult     Precautions / Restrictions Precautions Precautions: Fall Restrictions Weight Bearing Restrictions: Yes LLE Weight Bearing: Weight bearing as tolerated      Mobility  Bed Mobility Overal bed mobility: Needs Assistance;+2 for physical assistance Bed Mobility: Supine to Sit     Supine to sit: +2 for physical assistance;Max assist     General bed mobility comments: Needing max assist to support and move LLE along with body to pre-position for getting up, used bed pad to scoot hips; max assist to elevate trunk from bed, with second person to assist hip/LLE   Transfers Overall transfer level: Needs assistance Equipment used: Rolling walker (2 wheeled);None Transfers: Sit to/from [facility] Sit to Stand: +2 physical assistance;Max assist Stand pivot transfers: +2 physical assistance;Max assist       General transfer comment: Difficulty, and incr pain with any WBing LLE; 2 attempts at sit to stand, ultimately unsuccessful as pt had such difficulty with putting any weight on LLE; adjusted transfer style to squat pivot with R knee blocked and bilateral support at gait belt; noted with one instance of attempting WBing on LLE, pt's hip and knee buckled  Ambulation/Gait             General Gait Details: Unable today  Stairs            Wheelchair Mobility    Modified Rankin (Stroke Patients Only)       Balance Overall balance assessment: Needs assistance Sitting-balance support: Feet supported;Bilateral upper extremity supported Sitting balance-Leahy Scale: Fair       Standing balance-Leahy Scale: Zero                                Pertinent Vitals/Pain Pain Assessment: Faces Faces Pain Scale: Hurts worst Pain Location: L hip with any motion or WB Pain Descriptors / Indicators: Grimacing;Sharp Pain Intervention(s): Limited activity within patient's tolerance;Repositioned;Patient requesting pain meds-RN notified    Home Living Family/patient expects to be discharged to:: Private residence Living Arrangements: Spouse/significant other Available Help at Discharge: Family;Friend(s) Type of Home: Mobile home Home Access: Stairs to enter Entrance Stairs-Rails: Right;Left;Can reach both Entrance Stairs-Number of Steps: 5 Home Layout: One level Home Equipment: Shower seat      Prior Function Level of Independence: Independent               Hand Dominance        Extremity/Trunk Assessment   Upper Extremity Assessment: Generalized weakness           Lower Extremity Assessment: Generalized weakness;LLE deficits/detail   LLE Deficits / Details: Grossly decr AROM and strength, limited by pain; unable to WB through LLE     Communication   Communication: No difficulties  Cognition Arousal/Alertness: Awake/alert Behavior During Therapy: WFL for tasks assessed/performed Overall Cognitive Status: Within Functional Limits for tasks assessed                      General Comments      Exercises        Assessment/Plan    PT Assessment Patient needs continued PT services  PT Diagnosis Difficulty walking;Acute pain;Generalized weakness;Abnormality of gait   PT Problem List Decreased strength;Decreased range of motion;Decreased activity tolerance;Decreased balance;Decreased mobility;Decreased coordination;Decreased knowledge of use of DME;Decreased knowledge of precautions;Pain  PT Treatment Interventions DME instruction;Gait training;Functional mobility training;Therapeutic activities;Therapeutic exercise;Balance training;Patient/family education   PT Goals (Current goals can be found  in the Care Plan section) Acute Rehab PT Goals Patient Stated Goal: REALLY wants to visit her husband PT Goal Formulation: With patient Time For Goal Achievement: 11/30/14 Potential to Achieve Goals: Good    Frequency Min 3X/week   Barriers to discharge Decreased caregiver support Pt's husband had an MI after her surgery; today (12/20) he is in the hospital on unit 2H    Co-evaluation               End of Session Equipment Utilized During Treatment: Gait belt Activity Tolerance: Patient limited by pain Patient left: in chair;with call bell/phone within reach Nurse Communication: Mobility status         Time: 3536-1443 PT Time Calculation (min) (ACUTE ONLY): 29 min   Charges:   PT Evaluation $Initial PT Evaluation Tier I: 1 Procedure PT Treatments $Therapeutic Activity: 23-37 mins   PT G CodesRoney Marion Hamff 11/16/2014, 11:03 AM  Roney Marion, PT  Acute Rehabilitation Services Pager [phone] Office [phone]

## 2014-11-16 NOTE — Discharge Instructions (Signed)
°  Left lower extremity Weight Bearing as tolerated  Progress activities slowly Expect left hip and possible left knee soreness and swelling. Apply heat or ice as needed. Keep left hip dressing clean dry and intact, may shower with dressing intact.

## 2014-11-17 DIAGNOSIS — I471 Supraventricular tachycardia: Secondary | ICD-10-CM

## 2014-11-17 DIAGNOSIS — S72142S Displaced intertrochanteric fracture of left femur, sequela: Secondary | ICD-10-CM

## 2014-11-17 LAB — CBC
HEMATOCRIT: 31.8 % — AB (ref 36.0–46.0)
Hemoglobin: 10.8 g/dL — ABNORMAL LOW (ref 12.0–15.0)
MCH: 32 pg (ref 26.0–34.0)
MCHC: 34 g/dL (ref 30.0–36.0)
MCV: 94.4 fL (ref 78.0–100.0)
PLATELETS: 186 10*3/uL (ref 150–400)
RBC: 3.37 MIL/uL — ABNORMAL LOW (ref 3.87–5.11)
RDW: 13.4 % (ref 11.5–15.5)
WBC: 11.9 10*3/uL — AB (ref 4.0–10.5)

## 2014-11-17 LAB — BASIC METABOLIC PANEL
Anion gap: 10 (ref 5–15)
Anion gap: 12 (ref 5–15)
BUN: 15 mg/dL (ref 6–23)
BUN: 15 mg/dL (ref 6–23)
CALCIUM: 8.4 mg/dL (ref 8.4–10.5)
CHLORIDE: 98 meq/L (ref 96–112)
CO2: 29 mEq/L (ref 19–32)
CO2: 25 mEq/L (ref 19–32)
CREATININE: 0.71 mg/dL (ref 0.50–1.10)
Calcium: 8.6 mg/dL (ref 8.4–10.5)
Chloride: 96 mEq/L (ref 96–112)
Creatinine, Ser: 0.63 mg/dL (ref 0.50–1.10)
GFR calc Af Amer: >90 mL/min (ref >90)
GFR calc non Af Amer: 85 mL/min — ABNORMAL LOW (ref >90)
GFR calc non Af Amer: 89 mL/min — ABNORMAL LOW (ref >90)
GLUCOSE: 206 mg/dL — AB (ref 70–99)
Glucose, Bld: 151 mg/dL — ABNORMAL HIGH (ref 70–99)
Potassium: 5.5 mEq/L — ABNORMAL HIGH (ref 3.7–5.3)
Potassium: 5 mEq/L (ref 3.7–5.3)
SODIUM: 133 meq/L — AB (ref 137–147)
Sodium: 137 mEq/L (ref 137–147)

## 2014-11-17 LAB — VITAMIN D 25 HYDROXY (VIT D DEFICIENCY, FRACTURES): VIT D 25 HYDROXY: 41 ng/mL (ref 30–100)

## 2014-11-17 LAB — GLUCOSE, CAPILLARY
GLUCOSE-CAPILLARY: 245 mg/dL — AB (ref 70–99)
GLUCOSE-CAPILLARY: 257 mg/dL — AB (ref 70–99)
Glucose-Capillary: 157 mg/dL — ABNORMAL HIGH (ref 70–99)
Glucose-Capillary: 127 mg/dL — ABNORMAL HIGH (ref 70–99)

## 2014-11-17 LAB — URINE CULTURE: Colony Count: 80000

## 2014-11-17 MED ORDER — INSULIN ASPART 100 UNIT/ML ~~LOC~~ SOLN
0.0000 [IU] | Freq: Three times a day (TID) | SUBCUTANEOUS | Status: DC
  Administered 2014-11-17: 8 [IU] via SUBCUTANEOUS
  Administered 2014-11-18: 3 [IU] via SUBCUTANEOUS
  Administered 2014-11-18: 2 [IU] via SUBCUTANEOUS

## 2014-11-17 MED ORDER — DILTIAZEM HCL 60 MG PO TABS
60.0000 mg | ORAL_TABLET | Freq: Four times a day (QID) | ORAL | Status: DC
  Administered 2014-11-17 – 2014-11-18 (×4): 60 mg via ORAL
  Filled 2014-11-17 (×8): qty 1

## 2014-11-17 MED ORDER — INSULIN ASPART 100 UNIT/ML ~~LOC~~ SOLN: 0.0000 [IU] | Freq: Every day | SUBCUTANEOUS | Status: DC

## 2014-11-17 MED ORDER — PIOGLITAZONE HCL 15 MG PO TABS
15.0000 mg | ORAL_TABLET | Freq: Every day | ORAL | Status: DC
  Administered 2014-11-18: 15 mg via ORAL
  Filled 2014-11-17 (×2): qty 1

## 2014-11-17 MED ORDER — ATORVASTATIN CALCIUM 20 MG PO TABS
20.0000 mg | ORAL_TABLET | Freq: Every day | ORAL | Status: DC
  Administered 2014-11-17: 20 mg via ORAL
  Filled 2014-11-17 (×2): qty 1

## 2014-11-17 NOTE — Plan of Care (Signed)
K+ elevated. Repeat Bmet. Hold ACE I for now.   Debbe Odea, MD

## 2014-11-17 NOTE — Progress Notes (Signed)
Physical Therapy Treatment Patient Details Name: [name] MRN: [id number] DOB: Jul 16, 1946 Today's Date: 11/17/2014    History of Present Illness Admitted post fall in kitchen resulting in L hip fx; now s/p ORIF, WBAT    PT Comments    Pt with improved mobility to stand however con't to require maxAx2 and pt with unable to tolerate any WBing on L LE. Pt with minimal L quad set but was able to complete ankle pumps. Pt con't to benefit from SNF upon d/c for therapy to achieve independent function.   Follow Up Recommendations  SNF     Equipment Recommendations  Rolling walker with 5" wheels;3in1 (PT)    Recommendations for Other Services OT consult     Precautions / Restrictions Precautions Precautions: Fall Restrictions Weight Bearing Restrictions: Yes LLE Weight Bearing: Weight bearing as tolerated    Mobility  Bed Mobility Overal bed mobility: Needs Assistance;+2 for physical assistance Bed Mobility: Supine to Sit     Supine to sit: +2 for physical assistance;Max assist     General bed mobility comments: assist for L LE and assist for trunk elevation, max directional v/c's  Transfers Overall transfer level: Needs assistance Equipment used:  (2 person manual lift) Transfers: Sit to/from Stand;Stand Pivot Transfers Sit to Stand: +2 physical assistance;Max assist Stand pivot transfers: +2 physical assistance;Max assist       General transfer comment: pt with no ability to WB thru L LE, max directional v/c's to assist with transfer. Pt completed 3 sit to stands and 2 std pvt transfers. pt stood to get on bed pan at EOB, completed std pvt to [facility] and then to chair  Ambulation/Gait             General Gait Details: pt unable   Stairs            Wheelchair Mobility    Modified Rankin (Stroke Patients Only)       Balance Overall balance assessment: Needs assistance Sitting-balance support: Feet supported Sitting balance-Leahy Scale:  Poor Sitting balance - Comments: pt requires use of UEs to maintain sittin balance     Standing balance-Leahy Scale: Zero                      Cognition Arousal/Alertness: Awake/alert Behavior During Therapy: WFL for tasks assessed/performed Overall Cognitive Status: Within Functional Limits for tasks assessed                      Exercises      General Comments General comments (skin integrity, edema, etc.): spoke with RN regarding using lift equip to get pt back to bed due to increased difficulty and dependence      Pertinent Vitals/Pain Pain Assessment: 0-10 Pain Score: 7  Pain Location: L hip with wbing Pain Descriptors / Indicators: Aching;Constant Pain Intervention(s): Limited activity within patient's tolerance;Monitored during session    Home Living                      Prior Function            PT Goals (current goals can now be found in the care plan section) Progress towards PT goals: Progressing toward goals    Frequency  Min 3X/week    PT Plan Current plan remains appropriate    Co-evaluation             End of Session Equipment Utilized During Treatment: Gait belt Activity Tolerance:  Patient limited by pain Patient left: in chair;with call bell/phone within reach     Time: 4461-9012 PT Time Calculation (min) (ACUTE ONLY): 27 min  Charges:  $Therapeutic Activity: 23-37 mins                    G Codes:      Kingsley Callander 11/17/2014, 1:34 PM   Kittie Plater, PT, DPT Pager #: [phone] Office #: [phone]

## 2014-11-17 NOTE — Progress Notes (Signed)
Patient ID: [name], female   DOB: 11-18-1946, 68 y.o.   MRN: [id number]    SUBJECTIVE:  The patient is recovering after her orthopedic procedure. On the monitor she continues to have short burst of supraventricular tachycardia. This appears to be a reentrant tachycardia. There are no flutter waves seen. The rhythm is regular with a rate of 180. She does not feel this rhythm. It is not clear how much of the time she has this at home.   Filed Vitals:   11/16/14 2017 11/16/14 2343 11/17/14 0622 11/17/14 0800  BP: 122/53 110/61 143/44   Pulse: 79 83 103   Temp: 98.5 F (36.9 C) 98.5 F (36.9 C) 98.5 F (36.9 C)   TempSrc: Oral Oral Oral   Resp: 16 16 18 18   Height:      Weight:      SpO2: 99% 94% 98%      Intake/Output Summary (Last 24 hours) at 11/17/14 0952 Last data filed at 11/16/14 1700  Gross per 24 hour  Intake    480 ml  Output    300 ml  Net    180 ml    LABS: Basic Metabolic Panel:  Recent Labs  11/17/14 0547 11/17/14 0836  NA 137 133*  K 5.5* 5.0  CL 98 96  CO2 29 25  GLUCOSE 151* 206*  BUN 15 15  CREATININE 0.71 0.63  CALCIUM 8.6 8.4   Liver Function Tests:  Recent Labs  11/14/14 2148  ALBUMIN 3.2*   No results for input(s): LIPASE, AMYLASE in the last 72 hours. CBC:  Recent Labs  11/14/14 1731  11/16/14 0310 11/17/14 0547  WBC 12.5*  < > 8.5 11.9*  NEUTROABS 9.0*  --   --   --   HGB 12.8  < > 11.5* 10.8*  HCT 39.3  < > 34.9* 31.8*  MCV 93.1  < > 95.4 94.4  PLT 203  < > 140* 186  < > = values in this interval not displayed. Cardiac Enzymes: No results for input(s): CKTOTAL, CKMB, CKMBINDEX, TROPONINI in the last 72 hours. BNP: Invalid input(s): POCBNP D-Dimer: No results for input(s): DDIMER in the last 72 hours. Hemoglobin A1C:  Recent Labs  11/14/14 2148  HGBA1C 6.2*   Fasting Lipid Panel: No results for input(s): CHOL, HDL, LDLCALC, TRIG, CHOLHDL, LDLDIRECT in the last 72 hours. Thyroid Function Tests: No results  for input(s): TSH, T4TOTAL, T3FREE, THYROIDAB in the last 72 hours.  Invalid input(s): FREET3  RADIOLOGY: Dg Hip Complete Left  11/14/2014   CLINICAL DATA:  Patient status post fall. Left hip pain. Initial encounter.  EXAM: LEFT HIP - COMPLETE 2+ VIEW  COMPARISON:  None.  FINDINGS: There is suggestion of a nondisplaced intertrochanteric left proximal femur fracture. The left hip appears located. Cross-table lateral views are limited. SI joints are unremarkable.  IMPRESSION: Findings suggestive of nondisplaced intertrochanteric left proximal femur fracture.   Electronically Signed   By: Lovey Newcomer M.D.   On: 11/14/2014 18:28   Dg Femur Left  11/15/2014   CLINICAL DATA:  Patient status post ORIF left hip.  EXAM: DG C-ARM 61-120 MIN; LEFT FEMUR - 2 VIEW  FLUOROSCOPY TIME:  30 seconds  COMPARISON:  Hip radiograph 11/14/2014  FINDINGS: Patient status post ORIF left intertrochanteric proximal femur fracture. Hardware is visualized on multiple fluoroscopic images.  IMPRESSION: Status post ORIF left intertrochanteric proximal femur fracture.   Electronically Signed   By: Lovey Newcomer M.D.   On: 11/15/2014  11:01   Dg Chest Port 1 View  11/14/2014   CLINICAL DATA:  Preop LEFT hip surgery.  No chest complaints.  EXAM: PORTABLE CHEST - 1 VIEW  COMPARISON:  11/12/2013  FINDINGS: Patient is rotated partially due to the orthopedic injury.  Cardiomegaly. Calcified tortuous aorta. No definite infiltrates or failure. Osteopenia.  IMPRESSION: Cardiomegaly, no active disease.   Electronically Signed   By: Rolla Flatten M.D.   On: 11/14/2014 19:27   Dg C-arm 1-60 Min  11/15/2014   CLINICAL DATA:  Patient status post ORIF left hip.  EXAM: DG C-ARM 61-120 MIN; LEFT FEMUR - 2 VIEW  FLUOROSCOPY TIME:  30 seconds  COMPARISON:  Hip radiograph 11/14/2014  FINDINGS: Patient status post ORIF left intertrochanteric proximal femur fracture. Hardware is visualized on multiple fluoroscopic images.  IMPRESSION: Status post ORIF left  intertrochanteric proximal femur fracture.   Electronically Signed   By: Lovey Newcomer M.D.   On: 11/15/2014 11:01    PHYSICAL EXAM   patient is oriented to person time and place. Affect is normal. She is significantly overweight. Head is atraumatic. Sclera and conjunctiva were normal. There is no jugular venous distention. Lungs reveal scattered rhonchi. Cardiac exam reveals an S1 and S2. The abdomen is soft. Her legs are large but I do not feel any significant edema.   TELEMETRY: I have reviewed telemetry today November 17, 2014. She is in sinus rhythm at this time. However she is having recurrent episodes of supraventricular tachycardia with a rate of 180. This appears to be a reentrant tachycardia.   ASSESSMENT AND PLAN:     Intertrochanteric fracture of left hip     This is being treated by the orthopedic team.    SVT (supraventricular tachycardia)    Patient is having asymptomatic recurrent bursts of supraventricular tachycardia. This appears to be reentrant. She does not feel this rhythm. The rhythm is regular. When it breaks there are no flutter waves. I am going to add diltiazem to her therapy today. She will not be able to be discharged from the hospital or taken off telemetry until we have a better handle on this arrhythmia.    Coronary atherosclerosis of native coronary artery     Coronary disease is stable. I would like to get the patient back on Plavix. Currently she is receiving aspirin twice daily. I would suggest changing the patient from aspirin twice a day 2 aspirin once a day plus Plavix. We will need orthopedic approval for this.    Dola Argyle 11/17/2014 9:52 AM

## 2014-11-17 NOTE — Progress Notes (Addendum)
[facility] Progress Note   [name] TKW:[id number] DOB: 1946-03-23 DOA: 11/14/2014 PCP: Shirline Frees, MD  Brief narrative: [name] is a 68 y.o. female past medical history of diabetes mellitus, hypertension, hyperlipidemia, CAD NSVT who tripped and fell and developed a nondisplaced intertrochanteric left proximal femur fracture.   Subjective: Anxious to be discharged. Noted to be having bursts of SVT- Has no complaints.  Assessment/Plan: Principal Problem:   Intertrochanteric fracture of left hip -Status post ORIF-management per ortho  Active Problems:   Coronary atherosclerosis of native coronary artery- SVT noted on monitor -Cardiology has evaluated the patient and are managing -Plavix on hold but per cardiology, we can resume it when Ortho advises. Currently on BID ASA 325 per ortho    HTN (hypertension) -BP well-controlled after surgery-continue metoprolol    UTI (urinary tract infection) -UA is contaminated- repeat UA is grossly positive with TNTC WBC- also contaminated (albeit, less contaminated than the original) -culture grew out 80K multiple morphotypes - continue Rocephin for now-- can transition to Bactrim if discharging her - will need to repeat UA to look for clearance    Type 2 diabetes mellitus without complication -Holding Actos but sugars climbing at lunch today- resume Actos at 15 mg daily and increase sliding scale to moderate-continue Lantus  Code Status: Full code Family Communication:  Disposition Plan: SNF recommended  DVT prophylaxis: Per ortho-on aspirin 325 mg twice a day  Consultants: Ortho Cardiology  Procedures: 12/19/ORIF left hip  Antibiotics: Anti-infectives    Start     Dose/Rate Route Frequency Ordered Stop   11/15/14 1500  ceFAZolin (ANCEF) IVPB 2 g/50 mL premix     2 g100 mL/hr over 30 Minutes Intravenous Every 6 hours 11/15/14 1144 11/15/14 2142   11/15/14 0200  cefTRIAXone (ROCEPHIN) 1 g in dextrose 5 %  50 mL IVPB     1 g100 mL/hr over 30 Minutes Intravenous Every 24 hours 11/15/14 0115       Objective: Filed Weights   11/14/14 1705 11/14/14 2112  Weight: 95.255 kg (210 lb) 95.255 kg (210 lb)    Intake/Output Summary (Last 24 hours) at 11/17/14 1457 Last data filed at 11/17/14 0900  Gross per 24 hour  Intake    480 ml  Output    200 ml  Net    280 ml     Vitals Filed Vitals:   11/16/14 2343 11/17/14 0622 11/17/14 0800 11/17/14 1149  BP: 110/61 143/44    Pulse: 83 103    Temp: 98.5 F (36.9 C) 98.5 F (36.9 C)    TempSrc: Oral Oral    Resp: 16 18 18 18   Height:      Weight:      SpO2: 94% 98%      Exam: General: Awake alert oriented 3, No acute respiratory distress Lungs: Clear to auscultation bilaterally without wheezes or crackles Cardiovascular: Regular rate and rhythm without murmur gallop or rub normal S1 and S2 Abdomen: Nontender, nondistended, soft, bowel sounds positive, no rebound, no ascites, no appreciable mass Extremities: No significant cyanosis, clubbing, or edema bilateral lower extremities  Data Reviewed: Basic Metabolic Panel:  Recent Labs Lab 11/14/14 1731 11/15/14 0555 11/16/14 0310 11/17/14 0547 11/17/14 0836  NA 135* 135* 137 137 133*  K 4.1 3.9 3.8 5.5* 5.0  CL 95* 98 98 98 96  CO2 27 29 26 29 25   GLUCOSE 110* 139* 149* 151* 206*  BUN 22 16 13 15 15   CREATININE 0.84 0.63 0.74 0.71  0.63  CALCIUM 9.5 9.1 8.8 8.6 8.4   Liver Function Tests:  Recent Labs Lab 11/14/14 2148  ALBUMIN 3.2*   No results for input(s): LIPASE, AMYLASE in the last 168 hours. No results for input(s): AMMONIA in the last 168 hours. CBC:  Recent Labs Lab 11/14/14 1731 11/15/14 0555 11/16/14 0310 11/17/14 0547  WBC 12.5* 7.7 8.5 11.9*  NEUTROABS 9.0*  --   --   --   HGB 12.8 11.7* 11.5* 10.8*  HCT 39.3 35.4* 34.9* 31.8*  MCV 93.1 92.7 95.4 94.4  PLT 203 157 140* 186   Cardiac Enzymes: No results for input(s): CKTOTAL, CKMB, CKMBINDEX,  TROPONINI in the last 168 hours. BNP (last 3 results) No results for input(s): PROBNP in the last 8760 hours. CBG:  Recent Labs Lab 11/16/14 1123 11/16/14 1631 11/16/14 2148 11/17/14 0629 11/17/14 1143  GLUCAP 189* 171* 169* 157* 245*    Recent Results (from the past 240 hour(s))  Urine culture     Status: None (Preliminary result)   Collection Time: 11/15/14 12:11 AM  Result Value Ref Range Status   Specimen Description URINE, CLEAN CATCH  Final   Special Requests NONE  Final   Culture  Setup Time   Final    11/15/2014 06:27 Performed at [facility] PENDING  Incomplete   Culture   Final    Culture reincubated for better growth Performed at [facility]    Report Status PENDING  Incomplete  Surgical pcr screen     Status: None   Collection Time: 11/15/14  4:52 AM  Result Value Ref Range Status   MRSA, PCR NEGATIVE NEGATIVE Final   Staphylococcus aureus NEGATIVE NEGATIVE Final    Comment:        The Xpert SA Assay (FDA approved for NASAL specimens in patients over 10 years of age), is one component of a comprehensive surveillance program.  Test performance has been validated by [employer] for patients greater than or equal to 9 year old. It is not intended to diagnose infection nor to guide or monitor treatment.   Urine culture     Status: None   Collection Time: 11/15/14  5:00 PM  Result Value Ref Range Status   Specimen Description URINE, CATHETERIZED  Final   Special Requests NONE  Final   Culture  Setup Time   Final    11/16/2014 05:44 Performed at [facility]   Final    80,000 COLONIES/ML Performed at [facility]    Culture   Final    Multiple bacterial morphotypes present, none predominant. Suggest appropriate recollection if clinically indicated. Performed at [facility]    Report Status 11/17/2014 FINAL  Final     Studies:  Recent x-ray studies have been  reviewed in detail by the Attending Physician  Scheduled Meds:  Scheduled Meds: . aspirin EC  325 mg Oral BID PC  . atorvastatin  20 mg Oral QHS  . cefTRIAXone (ROCEPHIN) IVPB 1 gram/50 mL D5W  1 g Intravenous Q24H  . diltiazem  60 mg Oral 4 times per day  . docusate sodium  100 mg Oral BID  . furosemide  40 mg Oral Daily  . insulin aspart  0-9 Units Subcutaneous TID WC  . insulin glargine  5 Units Subcutaneous BID  . metoprolol  75 mg Oral BID   Continuous Infusions:   Time spent on care of this patient: 30  min    [place], MD 11/17/2014, 2:57 PM  LOS: 3 days   [facility] Office  [phone] Pager - Text Page per [email or web address]  If 7PM-7AM, please contact night-coverage [email or web address]

## 2014-11-17 NOTE — Progress Notes (Signed)
Utilization review completed.  

## 2014-11-17 NOTE — Progress Notes (Signed)
Subjective: 2 Days Post-Op Procedure(s) (LRB): OPEN REDUCTION INTERNAL FIXATION HIP INTERTROCH (Left) Patient reports pain as moderate.    Objective: Vital signs in last 24 hours: Temp:  [98.5 F (36.9 C)] 98.5 F (36.9 C) (12/21 0622) Pulse Rate:  [79-103] 103 (12/21 0622) Resp:  [16-18] 18 (12/21 1149) BP: (110-143)/(44-61) 143/44 mmHg (12/21 0622) SpO2:  [94 %-99 %] 98 % (12/21 0622)  Intake/Output from previous day: 12/20 0701 - 12/21 0700 In: 720 [P.O.:720] Out: 300 [Urine:300] Intake/Output this shift: Total I/O In: 240 [P.O.:240] Out: -    Recent Labs  11/14/14 1731 11/15/14 0555 11/16/14 0310 11/17/14 0547  HGB 12.8 11.7* 11.5* 10.8*    Recent Labs  11/16/14 0310 11/17/14 0547  WBC 8.5 11.9*  RBC 3.66* 3.37*  HCT 34.9* 31.8*  PLT 140* 186    Recent Labs  11/17/14 0547 11/17/14 0836  NA 137 133*  K 5.5* 5.0  CL 98 96  CO2 29 25  BUN 15 15  CREATININE 0.71 0.63  GLUCOSE 151* 206*  CALCIUM 8.6 8.4   No results for input(s): LABPT, INR in the last 72 hours. Left lower extremity: Sensation intact distally Intact pulses distally Dorsiflexion/Plantar flexion intact Incision: scant drainage Compartment soft  Assessment/Plan: 2 Days Post-Op Procedure(s) (LRB): OPEN REDUCTION INTERNAL FIXATION HIP INTERTROCH (Left) Up with therapy  Krystopher Kuenzel 11/17/2014, 2:16 PM

## 2014-11-18 ENCOUNTER — Encounter (HOSPITAL_COMMUNITY): Payer: Self-pay | Admitting: Orthopaedic Surgery

## 2014-11-18 DIAGNOSIS — N3000 Acute cystitis without hematuria: Secondary | ICD-10-CM

## 2014-11-18 DIAGNOSIS — S72142A Displaced intertrochanteric fracture of left femur, initial encounter for closed fracture: Principal | ICD-10-CM

## 2014-11-18 LAB — TYPE AND SCREEN
ABO/RH(D): A POS
Antibody Screen: POSITIVE
DAT, IGG: NEGATIVE
Donor AG Type: NEGATIVE
Donor AG Type: NEGATIVE
PT AG TYPE: NEGATIVE
UNIT DIVISION: 0
Unit division: 0

## 2014-11-18 LAB — BASIC METABOLIC PANEL
Anion gap: 9 (ref 5–15)
BUN: 15 mg/dL (ref 6–23)
CO2: 27 mmol/L (ref 19–32)
CREATININE: 0.74 mg/dL (ref 0.50–1.10)
Calcium: 8.3 mg/dL — ABNORMAL LOW (ref 8.4–10.5)
Chloride: 100 mEq/L (ref 96–112)
GFR, EST NON AFRICAN AMERICAN: 84 mL/min — AB (ref >90)
Glucose, Bld: 143 mg/dL — ABNORMAL HIGH (ref 70–99)
Potassium: 3.6 mmol/L (ref 3.5–5.1)
Sodium: 136 mmol/L (ref 135–145)

## 2014-11-18 LAB — CBC
HCT: 31.9 % — ABNORMAL LOW (ref 36.0–46.0)
Hemoglobin: 10.5 g/dL — ABNORMAL LOW (ref 12.0–15.0)
MCH: 30.4 pg (ref 26.0–34.0)
MCHC: 32.9 g/dL (ref 30.0–36.0)
MCV: 92.5 fL (ref 78.0–100.0)
PLATELETS: 144 10*3/uL — AB (ref 150–400)
RBC: 3.45 MIL/uL — ABNORMAL LOW (ref 3.87–5.11)
RDW: 13.1 % (ref 11.5–15.5)
WBC: 7.6 10*3/uL (ref 4.0–10.5)

## 2014-11-18 LAB — GLUCOSE, CAPILLARY
Glucose-Capillary: 133 mg/dL — ABNORMAL HIGH (ref 70–99)
Glucose-Capillary: 174 mg/dL — ABNORMAL HIGH (ref 70–99)

## 2014-11-18 MED ORDER — METOPROLOL TARTRATE 25 MG PO TABS: 75.0000 mg | ORAL_TABLET | Freq: Two times a day (BID) | ORAL | Status: DC

## 2014-11-18 MED ORDER — DILTIAZEM HCL ER COATED BEADS 180 MG PO CP24: 180.0000 mg | ORAL_CAPSULE | Freq: Every day | ORAL | Status: DC

## 2014-11-18 MED ORDER — PIOGLITAZONE HCL 15 MG PO TABS: 15.0000 mg | ORAL_TABLET | Freq: Every day | ORAL | Status: DC

## 2014-11-18 MED ORDER — DILTIAZEM HCL ER COATED BEADS 180 MG PO CP24
180.0000 mg | ORAL_CAPSULE | Freq: Every day | ORAL | Status: DC
  Administered 2014-11-18: 180 mg via ORAL
  Filled 2014-11-18 (×2): qty 1

## 2014-11-18 MED ORDER — DSS 100 MG PO CAPS: 100.0000 mg | ORAL_CAPSULE | Freq: Two times a day (BID) | ORAL | Status: DC

## 2014-11-18 NOTE — Progress Notes (Signed)
Patient ID: [name], female   DOB: March 13, 1946, 68 y.o.   MRN: [id number]    SUBJECTIVE:  The patient is tolerating the diltiazem that I added yesterday. She is not feeling any significant palpitations. Telemetry reveals brief runs of supraventricular tachycardia.   Filed Vitals:   11/17/14 2129 11/17/14 2309 11/18/14 0304 11/18/14 0638  BP: 120/65   117/60  Pulse: 80   79  Temp: 98.5 F (36.9 C)   98.4 F (36.9 C)  TempSrc: Oral   Oral  Resp:  18 18   Height:      Weight:      SpO2: 92% 92% 92% 94%     Intake/Output Summary (Last 24 hours) at 11/18/14 1010 Last data filed at 11/17/14 1500  Gross per 24 hour  Intake    380 ml  Output      0 ml  Net    380 ml    LABS: Basic Metabolic Panel:  Recent Labs  11/17/14 0836 11/18/14 0536  NA 133* 136  K 5.0 3.6  CL 96 100  CO2 25 27  GLUCOSE 206* 143*  BUN 15 15  CREATININE 0.63 0.74  CALCIUM 8.4 8.3*   Liver Function Tests: No results for input(s): AST, ALT, ALKPHOS, BILITOT, PROT, ALBUMIN in the last 72 hours. No results for input(s): LIPASE, AMYLASE in the last 72 hours. CBC:  Recent Labs  11/17/14 0547 11/18/14 0536  WBC 11.9* 7.6  HGB 10.8* 10.5*  HCT 31.8* 31.9*  MCV 94.4 92.5  PLT 186 144*   Cardiac Enzymes: No results for input(s): CKTOTAL, CKMB, CKMBINDEX, TROPONINI in the last 72 hours. BNP: Invalid input(s): POCBNP D-Dimer: No results for input(s): DDIMER in the last 72 hours. Hemoglobin A1C: No results for input(s): HGBA1C in the last 72 hours. Fasting Lipid Panel: No results for input(s): CHOL, HDL, LDLCALC, TRIG, CHOLHDL, LDLDIRECT in the last 72 hours. Thyroid Function Tests: No results for input(s): TSH, T4TOTAL, T3FREE, THYROIDAB in the last 72 hours.  Invalid input(s): FREET3  RADIOLOGY: Dg Hip Complete Left  11/14/2014   CLINICAL DATA:  Patient status post fall. Left hip pain. Initial encounter.  EXAM: LEFT HIP - COMPLETE 2+ VIEW  COMPARISON:  None.  FINDINGS: There is  suggestion of a nondisplaced intertrochanteric left proximal femur fracture. The left hip appears located. Cross-table lateral views are limited. SI joints are unremarkable.  IMPRESSION: Findings suggestive of nondisplaced intertrochanteric left proximal femur fracture.   Electronically Signed   By: Lovey Newcomer M.D.   On: 11/14/2014 18:28   Dg Femur Left  11/15/2014   CLINICAL DATA:  Patient status post ORIF left hip.  EXAM: DG C-ARM 61-120 MIN; LEFT FEMUR - 2 VIEW  FLUOROSCOPY TIME:  30 seconds  COMPARISON:  Hip radiograph 11/14/2014  FINDINGS: Patient status post ORIF left intertrochanteric proximal femur fracture. Hardware is visualized on multiple fluoroscopic images.  IMPRESSION: Status post ORIF left intertrochanteric proximal femur fracture.   Electronically Signed   By: Lovey Newcomer M.D.   On: 11/15/2014 11:01   Dg Chest Port 1 View  11/14/2014   CLINICAL DATA:  Preop LEFT hip surgery.  No chest complaints.  EXAM: PORTABLE CHEST - 1 VIEW  COMPARISON:  11/12/2013  FINDINGS: Patient is rotated partially due to the orthopedic injury.  Cardiomegaly. Calcified tortuous aorta. No definite infiltrates or failure. Osteopenia.  IMPRESSION: Cardiomegaly, no active disease.   Electronically Signed   By: Rolla Flatten M.D.   On: 11/14/2014 19:27  Dg C-arm 1-60 Min  11/15/2014   CLINICAL DATA:  Patient status post ORIF left hip.  EXAM: DG C-ARM 61-120 MIN; LEFT FEMUR - 2 VIEW  FLUOROSCOPY TIME:  30 seconds  COMPARISON:  Hip radiograph 11/14/2014  FINDINGS: Patient status post ORIF left intertrochanteric proximal femur fracture. Hardware is visualized on multiple fluoroscopic images.  IMPRESSION: Status post ORIF left intertrochanteric proximal femur fracture.   Electronically Signed   By: Lovey Newcomer M.D.   On: 11/15/2014 11:01    PHYSICAL EXAM   patient is oriented to person time and place. Lungs reveal scattered rhonchi. Cardiac exam reveals S1 and S2. There is no significant edema. Head is atraumatic.  Sclera and conjunctiva are normal. There is no respiratory distress. Abdomen is soft.   TELEMETRY: I have reviewed telemetry today November 18, 2014.   ASSESSMENT AND PLAN:    Intertrochanteric fracture of left hip    SVT (supraventricular tachycardia)    The patient is tolerating the addition of diltiazem. I will change her to long-acting diltiazem 180 mg daily. She does not need further workup during this hospitalization for this issue.    Coronary atherosclerosis of native coronary artery    The patient needs to be switched to aspirin plus Plavix when this is approved by orthopedics.    HTN (hypertension)   UTI (urinary tract infection)   Type 2 diabetes mellitus without complication   Dola Argyle 11/18/2014 10:10 AM

## 2014-11-18 NOTE — Progress Notes (Signed)
Report called to [facility].  

## 2014-11-18 NOTE — Clinical Social Work Note (Signed)
Pt to be discharged to [facility]. Pt updated at bedside.  Facility: [facility] Report number: [id number] Transportation: EMS ([address])  Lubertha Sayres, [place] ([phone]) Licensed Clinical Social Worker Orthopedics [phone]) and Surgical [phone])

## 2014-11-18 NOTE — Discharge Summary (Addendum)
Physician Discharge Summary  [name] ACZ:[id number] DOB: 1946-06-04 DOA: 11/14/2014  PCP: Shirline Frees, MD  Admit date: 11/14/2014 Discharge date: 11/18/2014  Time spent: 35 minutes  Recommendations for Outpatient Follow-up:  1. Please follow up on BMP and CBC in 3-5 days 2. Follow-up on blood sugars  Discharge Diagnoses:  Principal Problem:   Intertrochanteric fracture of left hip Active Problems:   SVT (supraventricular tachycardia)   Coronary atherosclerosis of native coronary artery   HTN (hypertension)   UTI (urinary tract infection)   Type 2 diabetes mellitus without complication   Discharge Condition: Stable  Diet recommendation: Heart healthy/carb modified diet  Filed Weights   11/14/14 1705 11/14/14 2112  Weight: 95.255 kg (210 lb) 95.255 kg (210 lb)    History of present illness:  Presented with  Patient tripped and fell this afternoon. She was not able to stand up. Husband called 58. No head injury, No LOC.  Patient was transferred to [facility] and was found nondisplaced intertrochanteric left proximal femur fracture. Patient had DES placed on 01/15/2013 in the setting of NSTEMI and is currently on Plavix. No chest pain.  Dr. Ninfa Linden was notified and plans to take her to OR in AM  Hospital Course:  Patient is a 68 year old female with a past medical history of hypertension, type 2 diabetes mellitus, was admitted to the medicine service on 11/14/2014, and a mechanical fall resulting in a nondisplaced intertrochanteric left proximal femoral fracture. Orthopedic surgery was consulted as she was taken to the OR on 11/15/2014 undergoing open reduction internal fixation of left intertrochanteric hip fracture, procedure performed by Dr. Ninfa Linden of orthopedic surgery. She tolerated procedure well there are no immediate complications. Hospitalization was Located by the development of nonsustained supraventricular tachycardia. Cardiology was consulted as she was  evaluated by Dr. Ron Parker. He was started on diltiazem by cardiology which she tolerated well. On 11/18/2014 cardiology did not recommend further workup during this hospitalization for this issue. She was discharged to skilled nursing facility to undergo rehabilitation.  Procedures:  Open reduction internal fixation of left hip fracture, procedure performed on 11/15/2014  Consultations:  Orthopedic surgery  Cardiology  Discharge Exam: Filed Vitals:   11/18/14 0800  BP:   Pulse:   Temp:   Resp: 18    General: Patient is awake, alert, no acute distress Cardiovascular: Regular rate and rhythm normal S1-S2 Respiratory: Clear to auscultation bilaterally Extremities. Surgical incision site of left hip appear clean, no evidence of hematoma or infection  Discharge Instructions   Discharge Instructions    Call MD for:  difficulty breathing, headache or visual disturbances    Complete by:  As directed      Call MD for:  extreme fatigue    Complete by:  As directed      Call MD for:  hives    Complete by:  As directed      Call MD for:  persistant dizziness or light-headedness    Complete by:  As directed      Call MD for:  persistant nausea and vomiting    Complete by:  As directed      Call MD for:  redness, tenderness, or signs of infection (pain, swelling, redness, odor or green/yellow discharge around incision site)    Complete by:  As directed      Call MD for:  severe uncontrolled pain    Complete by:  As directed      Call MD for:  temperature >100.4  Complete by:  As directed      Diet - low sodium heart healthy    Complete by:  As directed      Discharge wound care:    Complete by:  As directed   Keep dressing clean dry and intact . May shower with dressing intact left hip.     Increase activity slowly    Complete by:  As directed      Weight bearing as tolerated    Complete by:  As directed   Laterality:  left  Extremity:  Lower          Current Discharge  Medication List    START taking these medications   Details  diltiazem (CARDIZEM CD) 180 MG 24 hr capsule Take 1 capsule (180 mg total) by mouth daily. Qty: 30 capsule, Refills: 1    docusate sodium 100 MG CAPS Take 100 mg by mouth 2 (two) times daily. Qty: 10 capsule, Refills: 0    oxyCODONE-acetaminophen (ROXICET) 5-325 MG per tablet Take 1-2 tablets by mouth every 4 (four) hours as needed for severe pain. Qty: 60 tablet, Refills: 0      CONTINUE these medications which have CHANGED   Details  metoprolol (LOPRESSOR) 25 MG tablet Take 3 tablets (75 mg total) by mouth 2 (two) times daily. Qty: 60 tablet, Refills: 1    pioglitazone (ACTOS) 15 MG tablet Take 1 tablet (15 mg total) by mouth daily. Qty: 30 tablet, Refills: 1      CONTINUE these medications which have NOT CHANGED   Details  aspirin EC 81 MG tablet Take 1 tablet (81 mg total) by mouth daily. Qty: 90 tablet, Refills: 0    Cholecalciferol (VITAMIN D3) 1000 UNITS CAPS Take 2,000 Units by mouth every morning.     clopidogrel (PLAVIX) 75 MG tablet Take 1 tablet (75 mg total) by mouth daily with breakfast. Qty: 30 tablet, Refills: 1    furosemide (LASIX) 40 MG tablet Take 40 mg by mouth every morning.     insulin glargine (LANTUS) 100 UNIT/ML injection Inject 0.05 mLs (5 Units total) into the skin 2 (two) times daily. Qty: 10 mL, Refills: 12    metFORMIN (GLUCOPHAGE) 1000 MG tablet Take 1,000 mg by mouth 2 (two) times daily with a meal.    Multiple Vitamin (MULITIVITAMIN WITH MINERALS) TABS Take 1 tablet by mouth every morning.     simvastatin (ZOCOR) 40 MG tablet Take 40 mg by mouth at bedtime.    nitroGLYCERIN (NITROSTAT) 0.4 MG SL tablet Place 1 tablet (0.4 mg total) under the tongue every 5 (five) minutes as needed for chest pain. Qty: 25 tablet, Refills: 3    ondansetron (ZOFRAN ODT) 8 MG disintegrating tablet Take 1 tablet (8 mg total) by mouth every 8 (eight) hours as needed for nausea. Qty: 20 tablet,  Refills: 0      STOP taking these medications     ramipril (ALTACE) 10 MG capsule        No Known Allergies Follow-up Information    Follow up with Mcarthur Rossetti, MD. Schedule an appointment as soon as possible for a visit in 2 weeks.   Specialty:  Orthopedic Surgery   Contact information:   [place] [address] [phone]       Follow up with Shirline Frees, MD In 2 weeks.   Specialty:  Family Medicine   Contact information:   [place] [address] [phone]  Follow up with Dola Argyle, MD In 2 weeks.   Specialty:  Cardiology   Contact information:   [address] [place] [address] [phone]        The results of significant diagnostics from this hospitalization (including imaging, microbiology, ancillary and laboratory) are listed below for reference.    Significant Diagnostic Studies: Dg Hip Complete Left  11/14/2014   CLINICAL DATA:  Patient status post fall. Left hip pain. Initial encounter.  EXAM: LEFT HIP - COMPLETE 2+ VIEW  COMPARISON:  None.  FINDINGS: There is suggestion of a nondisplaced intertrochanteric left proximal femur fracture. The left hip appears located. Cross-table lateral views are limited. SI joints are unremarkable.  IMPRESSION: Findings suggestive of nondisplaced intertrochanteric left proximal femur fracture.   Electronically Signed   By: Lovey Newcomer M.D.   On: 11/14/2014 18:28   Dg Femur Left  11/15/2014   CLINICAL DATA:  Patient status post ORIF left hip.  EXAM: DG C-ARM 61-120 MIN; LEFT FEMUR - 2 VIEW  FLUOROSCOPY TIME:  30 seconds  COMPARISON:  Hip radiograph 11/14/2014  FINDINGS: Patient status post ORIF left intertrochanteric proximal femur fracture. Hardware is visualized on multiple fluoroscopic images.  IMPRESSION: Status post ORIF left intertrochanteric proximal femur fracture.   Electronically Signed   By: Lovey Newcomer M.D.   On: 11/15/2014 11:01    Dg Chest Port 1 View  11/14/2014   CLINICAL DATA:  Preop LEFT hip surgery.  No chest complaints.  EXAM: PORTABLE CHEST - 1 VIEW  COMPARISON:  11/12/2013  FINDINGS: Patient is rotated partially due to the orthopedic injury.  Cardiomegaly. Calcified tortuous aorta. No definite infiltrates or failure. Osteopenia.  IMPRESSION: Cardiomegaly, no active disease.   Electronically Signed   By: Rolla Flatten M.D.   On: 11/14/2014 19:27   Dg C-arm 1-60 Min  11/15/2014   CLINICAL DATA:  Patient status post ORIF left hip.  EXAM: DG C-ARM 61-120 MIN; LEFT FEMUR - 2 VIEW  FLUOROSCOPY TIME:  30 seconds  COMPARISON:  Hip radiograph 11/14/2014  FINDINGS: Patient status post ORIF left intertrochanteric proximal femur fracture. Hardware is visualized on multiple fluoroscopic images.  IMPRESSION: Status post ORIF left intertrochanteric proximal femur fracture.   Electronically Signed   By: Lovey Newcomer M.D.   On: 11/15/2014 11:01    Microbiology: Recent Results (from the past 240 hour(s))  Urine culture     Status: None (Preliminary result)   Collection Time: 11/15/14 12:11 AM  Result Value Ref Range Status   Specimen Description URINE, CLEAN CATCH  Final   Special Requests NONE  Final   Culture  Setup Time   Final    11/15/2014 06:27 Performed at [facility]   Final    >=100,000 COLONIES/ML Performed at [facility]    Culture   Final    ESCHERICHIA COLI Performed at [facility]    Report Status PENDING  Incomplete  Surgical pcr screen     Status: None   Collection Time: 11/15/14  4:52 AM  Result Value Ref Range Status   MRSA, PCR NEGATIVE NEGATIVE Final   Staphylococcus aureus NEGATIVE NEGATIVE Final    Comment:        The Xpert SA Assay (FDA approved for NASAL specimens in patients over 36 years of age), is one component of a comprehensive surveillance program.  Test performance has been validated by [employer] for patients greater than or equal  to  48 year old. It is not intended to diagnose infection nor to guide or monitor treatment.   Urine culture     Status: None   Collection Time: 11/15/14  5:00 PM  Result Value Ref Range Status   Specimen Description URINE, CATHETERIZED  Final   Special Requests NONE  Final   Culture  Setup Time   Final    11/16/2014 05:44 Performed at [facility]   Final    80,000 COLONIES/ML Performed at [facility]    Culture   Final    Multiple bacterial morphotypes present, none predominant. Suggest appropriate recollection if clinically indicated. Performed at [facility]    Report Status 11/17/2014 FINAL  Final     Labs: Basic Metabolic Panel:  Recent Labs Lab 11/15/14 0555 11/16/14 0310 11/17/14 0547 11/17/14 0836 11/18/14 0536  NA 135* 137 137 133* 136  K 3.9 3.8 5.5* 5.0 3.6  CL 98 98 98 96 100  CO2 29 26 29 25 27   GLUCOSE 139* 149* 151* 206* 143*  BUN 16 13 15 15 15   CREATININE 0.63 0.74 0.71 0.63 0.74  CALCIUM 9.1 8.8 8.6 8.4 8.3*   Liver Function Tests:  Recent Labs Lab 11/14/14 2148  ALBUMIN 3.2*   No results for input(s): LIPASE, AMYLASE in the last 168 hours. No results for input(s): AMMONIA in the last 168 hours. CBC:  Recent Labs Lab 11/14/14 1731 11/15/14 0555 11/16/14 0310 11/17/14 0547 11/18/14 0536  WBC 12.5* 7.7 8.5 11.9* 7.6  NEUTROABS 9.0*  --   --   --   --   HGB 12.8 11.7* 11.5* 10.8* 10.5*  HCT 39.3 35.4* 34.9* 31.8* 31.9*  MCV 93.1 92.7 95.4 94.4 92.5  PLT 203 157 140* 186 144*   Cardiac Enzymes: No results for input(s): CKTOTAL, CKMB, CKMBINDEX, TROPONINI in the last 168 hours. BNP: BNP (last 3 results) No results for input(s): PROBNP in the last 8760 hours. CBG:  Recent Labs Lab 11/17/14 0629 11/17/14 1143 11/17/14 1705 11/17/14 2143 11/18/14 0633  GLUCAP 157* 245* 257* 127* 133*       Signed:  Kelvin Cellar  [facility] 11/18/2014, 11:37 AM

## 2014-11-18 NOTE — Progress Notes (Signed)
Subjective: 3 Days Post-Op Procedure(s) (LRB): OPEN REDUCTION INTERNAL FIXATION HIP INTERTROCH (Left) Patient reports pain as moderate.   No complaints. Objective: Vital signs in last 24 hours: Temp:  [98.1 F (36.7 C)-98.5 F (36.9 C)] 98.4 F (36.9 C) (12/22 0962) Pulse Rate:  [79-86] 79 (12/22 0638) Resp:  [18-20] 18 (12/22 0304) BP: (117-126)/(48-65) 117/60 mmHg (12/22 0638) SpO2:  [92 %-96 %] 94 % (12/22 8366)  Intake/Output from previous day: 12/21 0701 - 12/22 0700 In: 620 [P.O.:620] Out: -  Intake/Output this shift:     Recent Labs  11/16/14 0310 11/17/14 0547 11/18/14 0536  HGB 11.5* 10.8* 10.5*    Recent Labs  11/17/14 0547 11/18/14 0536  WBC 11.9* 7.6  RBC 3.37* 3.45*  HCT 31.8* 31.9*  PLT 186 144*    Recent Labs  11/17/14 0836 11/18/14 0536  NA 133* 136  K 5.0 3.6  CL 96 100  CO2 25 27  BUN 15 15  CREATININE 0.63 0.74  GLUCOSE 206* 143*  CALCIUM 8.4 8.3*   No results for input(s): LABPT, INR in the last 72 hours.   Left lower extremity: Sensation intact distally Intact pulses distally Dorsiflexion/Plantar flexion intact Incision: dressing C/D/I Compartment soft  Assessment/Plan: 3 Days Post-Op Procedure(s) (LRB): OPEN REDUCTION INTERNAL FIXATION HIP INTERTROCH (Left) Up with therapy  Dispo SNF when medically ready  CLARK, [place] 11/18/2014, 11:10 AM

## 2014-11-18 NOTE — Progress Notes (Signed)
Physical Therapy Treatment Patient Details Name: [name] MRN: [id number] DOB: 1946/11/15 Today's Date: 11/18/2014    History of Present Illness Admitted post fall in kitchen resulting in L hip fx; now s/p ORIF, WBAT    PT Comments    Pt emotional and confused this date, due to husbands passing. Pt was able to tolerate standing x 30 seconds this date with support of RW. Pt participated in LE there ex. Pt remains unable to bear weight through L LE inhibiting ambulation progression.   Follow Up Recommendations  SNF     Equipment Recommendations       Recommendations for Other Services       Precautions / Restrictions Precautions Precautions: Fall Restrictions Weight Bearing Restrictions: Yes LLE Weight Bearing: Weight bearing as tolerated    Mobility  Bed Mobility Overal bed mobility: Needs Assistance;+2 for physical assistance Bed Mobility: Rolling;Supine to Sit;Sit to Supine Rolling: Modified independent (Device/Increase time) (pt used bed rail and rolled onto L side despite reccomendation not to roll onto surgical hip)   Supine to sit: +2 for physical assistance;Max assist Sit to supine: Max assist;+2 for physical assistance   General bed mobility comments: assist for trunk and L LE management. required significant increase in time  Transfers Overall transfer level: Needs assistance Equipment used: Rolling walker (2 wheeled);None Transfers: Sit to/from Stand Sit to Stand: +2 physical assistance;Max assist         General transfer comment: max v/c's for hand placement and technique. Pt requires the use of PT and PT tech to pull self up on. pt was not able to push up from bed. due to emotional state from husbands passing  Ambulation/Gait             General Gait Details: pt unable   Stairs            Wheelchair Mobility    Modified Rankin (Stroke Patients Only)       Balance             Standing balance-Leahy Scale:  Poor Standing balance comment: pt was able to tolerate standing x 1 min this date with support of walker. attempted to take step however L LE buckled and pt returned to sitting EOB and requested to lay down                    Cognition Arousal/Alertness: Awake/alert Behavior During Therapy:  (emotional) Overall Cognitive Status:  (pt with episodes of confusion this date)                      Exercises Total Joint Exercises Ankle Circles/Pumps: AROM;Both;10 reps;Supine Quad Sets: AROM;Both;10 reps;Supine Gluteal Sets: AROM;Both;10 reps;Supine Heel Slides: AAROM;Left;10 reps;Supine Hip ABduction/ADduction: AAROM;Left;10 reps;Supine    General Comments General comments (skin integrity, edema, etc.): pt with episodes of confusion and was emotional this date due to husbands passing. pt also with unrealistic ideations of functional ability. ie. "i want to walk into the bathroom" when she can barely stand. Pt  was assisted on off bed pan. pt dependent for hygiene.      Pertinent Vitals/Pain Pain Assessment: 0-10 Pain Score: 5  Pain Location: L hip Pain Intervention(s): Limited activity within patient's tolerance;Monitored during session    Home Living                      Prior Function            PT Goals (  current goals can now be found in the care plan section) Progress towards PT goals: Progressing toward goals    Frequency  Min 3X/week    PT Plan Current plan remains appropriate    Co-evaluation             End of Session Equipment Utilized During Treatment: Gait belt Activity Tolerance: Patient limited by pain Patient left: in bed;with call bell/phone within reach     Time: 1131-1205 PT Time Calculation (min) (ACUTE ONLY): 34 min  Charges:  $Therapeutic Exercise: 8-22 mins (8) $Therapeutic Activity: 8-22 mins                    G Codes:      Kingsley Callander 11/18/2014, 3:04 PM   Kittie Plater, PT, DPT Pager #:  [phone] Office #: [phone]

## 2014-11-19 LAB — URINE CULTURE

## 2014-12-04 ENCOUNTER — Other Ambulatory Visit: Payer: Self-pay

## 2014-12-04 ENCOUNTER — Encounter (HOSPITAL_COMMUNITY): Payer: Self-pay | Admitting: *Deleted

## 2014-12-04 ENCOUNTER — Inpatient Hospital Stay (HOSPITAL_COMMUNITY)
Admission: EM | Admit: 2014-12-04 | Discharge: 2014-12-08 | DRG: 872 | Disposition: A | Discharge status: Home / Self Care | Payer: Medicare Other | Attending: Internal Medicine | Admitting: Internal Medicine

## 2014-12-04 DIAGNOSIS — Z8781 Personal history of (healed) traumatic fracture: Secondary | ICD-10-CM

## 2014-12-04 DIAGNOSIS — Z66 Do not resuscitate: Secondary | ICD-10-CM | POA: Diagnosis present

## 2014-12-04 DIAGNOSIS — E876 Hypokalemia: Secondary | ICD-10-CM | POA: Diagnosis present

## 2014-12-04 DIAGNOSIS — K5900 Constipation, unspecified: Secondary | ICD-10-CM | POA: Diagnosis present

## 2014-12-04 DIAGNOSIS — B965 Pseudomonas (aeruginosa) (mallei) (pseudomallei) as the cause of diseases classified elsewhere: Secondary | ICD-10-CM | POA: Diagnosis present

## 2014-12-04 DIAGNOSIS — I5032 Chronic diastolic (congestive) heart failure: Secondary | ICD-10-CM | POA: Diagnosis present

## 2014-12-04 DIAGNOSIS — A419 Sepsis, unspecified organism: Secondary | ICD-10-CM | POA: Diagnosis present

## 2014-12-04 DIAGNOSIS — I2510 Atherosclerotic heart disease of native coronary artery without angina pectoris: Secondary | ICD-10-CM | POA: Diagnosis present

## 2014-12-04 DIAGNOSIS — Z7902 Long term (current) use of antithrombotics/antiplatelets: Secondary | ICD-10-CM | POA: Diagnosis not present

## 2014-12-04 DIAGNOSIS — N390 Urinary tract infection, site not specified: Secondary | ICD-10-CM | POA: Diagnosis present

## 2014-12-04 DIAGNOSIS — S72142A Displaced intertrochanteric fracture of left femur, initial encounter for closed fracture: Secondary | ICD-10-CM | POA: Diagnosis present

## 2014-12-04 DIAGNOSIS — Z9861 Coronary angioplasty status: Secondary | ICD-10-CM

## 2014-12-04 DIAGNOSIS — Z79899 Other long term (current) drug therapy: Secondary | ICD-10-CM

## 2014-12-04 DIAGNOSIS — E119 Type 2 diabetes mellitus without complications: Secondary | ICD-10-CM | POA: Diagnosis present

## 2014-12-04 DIAGNOSIS — I252 Old myocardial infarction: Secondary | ICD-10-CM | POA: Diagnosis present

## 2014-12-04 DIAGNOSIS — E780 Pure hypercholesterolemia: Secondary | ICD-10-CM | POA: Diagnosis present

## 2014-12-04 DIAGNOSIS — S72142S Displaced intertrochanteric fracture of left femur, sequela: Secondary | ICD-10-CM

## 2014-12-04 DIAGNOSIS — I10 Essential (primary) hypertension: Secondary | ICD-10-CM | POA: Diagnosis present

## 2014-12-04 DIAGNOSIS — R319 Hematuria, unspecified: Secondary | ICD-10-CM | POA: Diagnosis present

## 2014-12-04 DIAGNOSIS — D500 Iron deficiency anemia secondary to blood loss (chronic): Secondary | ICD-10-CM

## 2014-12-04 DIAGNOSIS — R531 Weakness: Secondary | ICD-10-CM | POA: Diagnosis present

## 2014-12-04 LAB — URINALYSIS, ROUTINE W REFLEX MICROSCOPIC
Glucose, UA: NEGATIVE mg/dL
Ketones, ur: 40 mg/dL — AB
Nitrite: POSITIVE — AB
Protein, ur: >300 mg/dL — AB
SPECIFIC GRAVITY, URINE: 1.015 (ref 1.005–1.030)
UROBILINOGEN UA: 1 mg/dL (ref 0.0–1.0)
pH: 5 (ref 5.0–8.0)

## 2014-12-04 LAB — BASIC METABOLIC PANEL
Anion gap: 8 (ref 5–15)
BUN: 14 mg/dL (ref 6–23)
CO2: 27 mmol/L (ref 19–32)
Calcium: 8.6 mg/dL (ref 8.4–10.5)
Chloride: 104 mEq/L (ref 96–112)
Creatinine, Ser: 0.76 mg/dL (ref 0.50–1.10)
GFR calc Af Amer: >90 mL/min (ref >90)
GFR calc non Af Amer: 83 mL/min — ABNORMAL LOW (ref >90)
GLUCOSE: 111 mg/dL — AB (ref 70–99)
POTASSIUM: 3.2 mmol/L — AB (ref 3.5–5.1)
Sodium: 139 mmol/L (ref 135–145)

## 2014-12-04 LAB — PROTIME-INR
INR: 1.03 (ref 0.00–1.49)
Prothrombin Time: 13.7 seconds (ref 11.6–15.2)

## 2014-12-04 LAB — URINE MICROSCOPIC-ADD ON

## 2014-12-04 LAB — CBC WITH DIFFERENTIAL/PLATELET
BASOS PCT: 0 % (ref 0–1)
Basophils Absolute: 0 10*3/uL (ref 0.0–0.1)
Eosinophils Absolute: 0.2 10*3/uL (ref 0.0–0.7)
Eosinophils Relative: 2 % (ref 0–5)
HCT: 29.7 % — ABNORMAL LOW (ref 36.0–46.0)
Hemoglobin: 9.5 g/dL — ABNORMAL LOW (ref 12.0–15.0)
LYMPHS ABS: 2.1 10*3/uL (ref 0.7–4.0)
Lymphocytes Relative: 24 % (ref 12–46)
MCH: 30.9 pg (ref 26.0–34.0)
MCHC: 32 g/dL (ref 30.0–36.0)
MCV: 96.7 fL (ref 78.0–100.0)
MONO ABS: 0.8 10*3/uL (ref 0.1–1.0)
Monocytes Relative: 9 % (ref 3–12)
NEUTROS ABS: 5.8 10*3/uL (ref 1.7–7.7)
NEUTROS PCT: 65 % (ref 43–77)
Platelets: 293 10*3/uL (ref 150–400)
RBC: 3.07 MIL/uL — AB (ref 3.87–5.11)
RDW: 14.5 % (ref 11.5–15.5)
WBC: 8.9 10*3/uL (ref 4.0–10.5)

## 2014-12-04 LAB — POC OCCULT BLOOD, ED: Fecal Occult Bld: NEGATIVE

## 2014-12-04 LAB — APTT: APTT: 35 s (ref 24–37)

## 2014-12-04 LAB — GLUCOSE, CAPILLARY: GLUCOSE-CAPILLARY: 124 mg/dL — AB (ref 70–99)

## 2014-12-04 MED ORDER — PRO-STAT SUGAR FREE PO LIQD
30.0000 mL | Freq: Every day | ORAL | Status: DC
  Administered 2014-12-05 – 2014-12-06 (×2): 30 mL via ORAL
  Filled 2014-12-04 (×4): qty 30

## 2014-12-04 MED ORDER — CEFTRIAXONE SODIUM 1 G IJ SOLR
1.0000 g | Freq: Once | INTRAMUSCULAR | Status: AC
  Administered 2014-12-04: 1 g via INTRAVENOUS
  Filled 2014-12-04: qty 10

## 2014-12-04 MED ORDER — SODIUM CHLORIDE 0.9 % IJ SOLN: 3.0000 mL | INTRAMUSCULAR | Status: DC | PRN

## 2014-12-04 MED ORDER — CEFTRIAXONE SODIUM IN DEXTROSE 20 MG/ML IV SOLN
1.0000 g | INTRAVENOUS | Status: DC
  Administered 2014-12-05 – 2014-12-07 (×3): 1 g via INTRAVENOUS
  Filled 2014-12-04 (×3): qty 50

## 2014-12-04 MED ORDER — SODIUM CHLORIDE 0.9 % IJ SOLN
3.0000 mL | Freq: Two times a day (BID) | INTRAMUSCULAR | Status: DC
  Administered 2014-12-04 – 2014-12-07 (×7): 3 mL via INTRAVENOUS

## 2014-12-04 MED ORDER — ATORVASTATIN CALCIUM 20 MG PO TABS
20.0000 mg | ORAL_TABLET | Freq: Every day | ORAL | Status: DC
  Administered 2014-12-04 – 2014-12-07 (×4): 20 mg via ORAL
  Filled 2014-12-04 (×5): qty 1

## 2014-12-04 MED ORDER — VITAMIN D3 25 MCG (1000 UNIT) PO TABS
2000.0000 [IU] | ORAL_TABLET | Freq: Every day | ORAL | Status: DC
Start: 2014-12-05 — End: 2014-12-08
  Administered 2014-12-05 – 2014-12-08 (×4): 2000 [IU] via ORAL
  Filled 2014-12-04 (×4): qty 2

## 2014-12-04 MED ORDER — INSULIN GLARGINE 100 UNIT/ML ~~LOC~~ SOLN
5.0000 [IU] | Freq: Two times a day (BID) | SUBCUTANEOUS | Status: DC
  Administered 2014-12-04 – 2014-12-08 (×8): 5 [IU] via SUBCUTANEOUS
  Filled 2014-12-04 (×9): qty 0.05

## 2014-12-04 MED ORDER — ADULT MULTIVITAMIN W/MINERALS CH
1.0000 | ORAL_TABLET | Freq: Every morning | ORAL | Status: DC
  Administered 2014-12-05 – 2014-12-08 (×4): 1 via ORAL
  Filled 2014-12-04 (×4): qty 1

## 2014-12-04 MED ORDER — OXYCODONE-ACETAMINOPHEN 5-325 MG PO TABS
1.0000 | ORAL_TABLET | ORAL | Status: DC | PRN
  Administered 2014-12-05 – 2014-12-07 (×2): 1 via ORAL
  Filled 2014-12-04 (×2): qty 1

## 2014-12-04 MED ORDER — NITROGLYCERIN 0.4 MG SL SUBL: 0.4000 mg | SUBLINGUAL_TABLET | SUBLINGUAL | Status: DC | PRN

## 2014-12-04 MED ORDER — POLYETHYLENE GLYCOL 3350 17 G PO PACK
17.0000 g | PACK | Freq: Every day | ORAL | Status: DC | PRN
  Administered 2014-12-05: 17 g via ORAL
  Filled 2014-12-04 (×3): qty 1

## 2014-12-04 MED ORDER — SODIUM CHLORIDE 0.9 % IV SOLN: 250.0000 mL | INTRAVENOUS | Status: DC | PRN

## 2014-12-04 MED ORDER — DILTIAZEM HCL ER COATED BEADS 180 MG PO CP24
180.0000 mg | ORAL_CAPSULE | Freq: Every day | ORAL | Status: DC
Start: 2014-12-05 — End: 2014-12-08
  Administered 2014-12-05 – 2014-12-08 (×4): 180 mg via ORAL
  Filled 2014-12-04 (×4): qty 1

## 2014-12-04 MED ORDER — SIMVASTATIN 40 MG PO TABS: 40.0000 mg | ORAL_TABLET | Freq: Every day | ORAL | Status: DC

## 2014-12-04 MED ORDER — ONDANSETRON 8 MG PO TBDP
8.0000 mg | ORAL_TABLET | Freq: Three times a day (TID) | ORAL | Status: DC | PRN
  Filled 2014-12-04: qty 1

## 2014-12-04 MED ORDER — DOCUSATE SODIUM 100 MG PO CAPS
100.0000 mg | ORAL_CAPSULE | Freq: Two times a day (BID) | ORAL | Status: DC
  Administered 2014-12-04 – 2014-12-08 (×3): 100 mg via ORAL
  Filled 2014-12-04 (×9): qty 1

## 2014-12-04 MED ORDER — POTASSIUM CHLORIDE CRYS ER 20 MEQ PO TBCR
40.0000 meq | EXTENDED_RELEASE_TABLET | Freq: Once | ORAL | Status: AC
  Administered 2014-12-04: 40 meq via ORAL
  Filled 2014-12-04: qty 2

## 2014-12-04 MED ORDER — METOPROLOL TARTRATE 50 MG PO TABS
75.0000 mg | ORAL_TABLET | Freq: Two times a day (BID) | ORAL | Status: DC
  Administered 2014-12-04 – 2014-12-08 (×8): 75 mg via ORAL
  Filled 2014-12-04 (×9): qty 1

## 2014-12-04 MED ORDER — VITAMIN D3 25 MCG (1000 UT) PO CAPS: 2000.0000 [IU] | ORAL_CAPSULE | Freq: Every morning | ORAL | Status: DC

## 2014-12-04 MED ORDER — INSULIN ASPART 100 UNIT/ML ~~LOC~~ SOLN
0.0000 [IU] | Freq: Three times a day (TID) | SUBCUTANEOUS | Status: DC
  Administered 2014-12-05: 1 [IU] via SUBCUTANEOUS
  Administered 2014-12-05 (×2): 2 [IU] via SUBCUTANEOUS
  Administered 2014-12-06 (×3): 1 [IU] via SUBCUTANEOUS
  Administered 2014-12-07: 2 [IU] via SUBCUTANEOUS
  Administered 2014-12-07 – 2014-12-08 (×2): 1 [IU] via SUBCUTANEOUS

## 2014-12-04 NOTE — Progress Notes (Signed)
  CARE MANAGEMENT ED NOTE 12/04/2014  Patient:  [name], [name]   Account Number:  [id number]  Date Initiated:  12/04/2014  Documentation initiated by:  Livia Snellen  Subjective/Objective Assessment:   Patient presents to Ed with intermittent vaginal bleeding     Subjective/Objective Assessment Detail:   Patient with pmhx of HTN, DM, CAD, high cholesterol, SVT, rectal ulceration, ORIF of left hip 11/15/2014.  Patient admitted to [facility] hospital from 11/14/14 to 11/18/2014 for ORIF of left hip, sent to [facility] for rehab.  Patient currently with UTI     Action/Plan:   Action/Plan Detail:   Anticipated DC Date:       Status Recommendation to Physician:   Result of Recommendation:    Other ED Services  Consult Working Plan   In-house referral  Clinical Social Worker   DC Forensic scientist  Other    Choice offered to / List presented to:            Status of service:  Completed, signed off  ED Comments:   ED Comments Detail:  EDCM spke to patient at bedisde.  Patient confirms she is from [facility] re[facility] facility for rehab post ORIF left hip. Patient reports she does not live at [facility].  Patient's home is located in [place].  Patient reports her husband has passed away this 03/12/23 from [place] massive heart attack and stroke.  Patient is unhappy at [facility] and is requesting another rehab facility.  EDCM placed consult in to EDSW to speak to patient.  Patient confirms her pcp is Dr. Azalia Bilis of [facility].  Patient reports she was seen once byt he physician at [facility].  No further EDCM needs at this time.

## 2014-12-04 NOTE — ED Notes (Signed)
Bed: HY86 Expected date:  Expected time:  Means of arrival:  Comments: Ems- 69 yo F vaginal bleeding

## 2014-12-04 NOTE — H&P (Addendum)
[facility] History and Physical  WYNNIE PACETTI Cooke:[id number] DOB: Apr 13, 1946 DOA: 12/04/2014  Referring physician: ED physician PCP: Shirline Frees, MD  Specialists:   Chief Complaint: blood in adult diaper and weakness.   HPI: [name] is a 69 y.o. female with past medical history of DM, SVT, recent left hip fx s/p ORIF 11/14/2014, presenting to the ED from [name] rehab facility with a chief complaint of blood in adult diaper and weakness.   Patient reports blood in diaper for 5 days. It is unsure if it is from her urine, vagina, or rectum.  She reports having to hold her urine for several days due to lack of staff to help her get on the bed pan.  She reports having urinary urgency, dysuria, and suprapubic abdominal pain.  She reports having constipation for 3 days and one episode of nausea without emesis. Patient reports associated generalized weakness.  She attributes her weakness to currently living situation and recent spouse death.  Also note, patient had hx of stent placement 12/2012. She used to be on aspirin and Plavix which were discontinued recently, likely due to left hip surgery. Currently she is not taking aspirin or Plavix. Patient denies fever, chills, headaches, cough, chest pain, SOB, diarrhea, skin rashes, or leg swelling.  Per [facility] paperwork,  pt Hbg has been trending down. 1/7: 9.5<--1/6: 10.7<-- 1/5: 11.3<-- 1/4: 12.1<-- 1/3: 12.4. In emergency room, patient was found to have INR 1.03, positive urinalysis for UTI, hypokalemia with potassium 3.2, hemoglobin 9.5. FOBT negative. Patient is admitted to inpatient for further evaluation and treatment.  Review of Systems: As presented in the history of presenting illness, rest negative.  Where does patient live? [facility]  Can patient participate in ADLs? none  Allergy: No Known Allergies  Past Medical History  Diagnosis Date  . Diabetes mellitus   . Hypertension   . Hypercholesteremia   . CAD  (coronary artery disease)     a. NSTEMI in setting of UTI and SVT 12/2012 => LHC 01/08/13: Proximal LAD 30%, ostial diagonal 30-40%, proximal RCA 95%, inferior AK, EF 45-50% => PCI: Promus DES x 2 to RCA;  b. Echo 01/05/13: EF 95-62%, grade 1 diastolic dysfunction, MAC   . SVT (supraventricular tachycardia)   . Rectal ulceration     colonoscopy 12/2012    Past Surgical History  Procedure Laterality Date  . Eye surgery      laser , cat bil  . Tubal ligation    . Kyphoplasty  12/27/2012    Procedure: KYPHOPLASTY;  Surgeon: Sinclair Ship, MD;  Location: [place];  Service: Orthopedics;  Laterality: Bilateral;  T-8 kyphoplasty  . Colonoscopy Left 01/12/2013    Procedure: COLONOSCOPY;  Surgeon: Wonda Horner, MD;  Location: [facility] ENDOSCOPY;  Service: Endoscopy;  Laterality: Left;  . Colonoscopy N/A 01/14/2013    Procedure: COLONOSCOPY;  Surgeon: Wonda Horner, MD;  Location: [facility] ENDOSCOPY;  Service: Endoscopy;  Laterality: N/A;  Rm 2034, attemped Colon on 2-15=inadequate prep  . Coronary angioplasty  12/2012  . Left heart catheterization with coronary angiogram N/A 01/08/2013    Procedure: LEFT HEART CATHETERIZATION WITH CORONARY ANGIOGRAM;  Surgeon: Thayer Headings, MD;  Location: [facility] CATH LAB;  Service: Cardiovascular;  Laterality: N/A;  . Percutaneous stent intervention N/A 01/15/2013    Procedure: PERCUTANEOUS STENT INTERVENTION;  Surgeon: Peter M Martinique, MD;  Location: [facility] CATH LAB;  Service: Cardiovascular;  Laterality: N/A;  . Orif hip fracture Left 11/15/2014    Procedure:  OPEN REDUCTION INTERNAL FIXATION HIP INTERTROCH;  Surgeon: Mcarthur Rossetti, MD;  Location: [place];  Service: Orthopedics;  Laterality: Left;    Social History:  reports that she has never smoked. She has never used smokeless tobacco. She reports that she does not drink alcohol or use illicit drugs.  Family History:  Family History  Problem Relation Age of Onset  . Hypertension Brother      Prior to Admission  medications   Medication Sig Start Date End Date Taking? Authorizing Provider  Amino Acids-Protein Hydrolys (FEEDING SUPPLEMENT, PRO-STAT SUGAR FREE 64,) LIQD Take 30 mLs by mouth daily.   Yes Historical Provider, MD  Cholecalciferol (VITAMIN D3) 1000 UNITS CAPS Take 2,000 Units by mouth every morning.    Yes Historical Provider, MD  diltiazem (CARDIZEM CD) 180 MG 24 hr capsule Take 1 capsule (180 mg total) by mouth daily. 11/18/14  Yes Kelvin Cellar, MD  docusate sodium 100 MG CAPS Take 100 mg by mouth 2 (two) times daily. 11/18/14  Yes Kelvin Cellar, MD  furosemide (LASIX) 40 MG tablet Take 40 mg by mouth every morning.    Yes Historical Provider, MD  insulin glargine (LANTUS) 100 UNIT/ML injection Inject 0.05 mLs (5 Units total) into the skin 2 (two) times daily. 11/13/13 06/23/15 Yes Christina P Rama, MD  metFORMIN (GLUCOPHAGE) 1000 MG tablet Take 1,000 mg by mouth 2 (two) times daily with a meal.   Yes Historical Provider, MD  metoprolol (LOPRESSOR) 25 MG tablet Take 3 tablets (75 mg total) by mouth 2 (two) times daily. 11/18/14  Yes Kelvin Cellar, MD  Multiple Vitamin (MULITIVITAMIN WITH MINERALS) TABS Take 1 tablet by mouth every morning.    Yes Historical Provider, MD  nitroGLYCERIN (NITROSTAT) 0.4 MG SL tablet Place 1 tablet (0.4 mg total) under the tongue every 5 (five) minutes as needed for chest pain. 04/16/13  Yes Scott T Kathlen Mody, PA-C  ondansetron (ZOFRAN ODT) 8 MG disintegrating tablet Take 1 tablet (8 mg total) by mouth every 8 (eight) hours as needed for nausea. 12/10/12  Yes Harden Mo, MD  oxyCODONE-acetaminophen (ROXICET) 5-325 MG per tablet Take 1-2 tablets by mouth every 4 (four) hours as needed for severe pain. 11/16/14  Yes Erskine Emery, PA-C  pioglitazone (ACTOS) 15 MG tablet Take 1 tablet (15 mg total) by mouth daily. 11/18/14  Yes Kelvin Cellar, MD  simvastatin (ZOCOR) 40 MG tablet Take 40 mg by mouth at bedtime.   Yes Historical Provider, MD  aspirin EC 81 MG  tablet Take 1 tablet (81 mg total) by mouth daily. Patient not taking: Reported on 12/04/2014 04/16/13   Liliane Shi, PA-C  clopidogrel (PLAVIX) 75 MG tablet Take 1 tablet (75 mg total) by mouth daily with breakfast. Patient not taking: Reported on 12/04/2014 01/17/13   Hosie Poisson, MD    Physical Exam: Filed Vitals:   12/04/14 1431 12/04/14 1605  BP: 125/56 146/55  Pulse: 72 75  Temp: 98.1 F (36.7 C)   TempSrc: Oral   Resp: 18 18  SpO2: 93% 92%   General: Not in acute distress HEENT:       Eyes: PERRL, EOMI, no scleral icterus       ENT: No discharge from the ears and nose, no pharynx injection, no tonsillar enlargement.        Neck: No JVD, no bruit, no mass felt. Cardiac: S1/S2, RRR, No murmurs, No gallops or rubs Pulm: Good air movement bilaterally. Clear to auscultation bilaterally. No rales, wheezing, rhonchi or  rubs. Abd: Soft, nondistended, tenderness over suprapubic area, no rebound pain, no organomegaly, BS present.  Genitourinary: Bloody fluid in adult diaper and on bed. No active bleeding on perineal exam per ED. no CVA tenderness  Ext: No edema bilaterally. 2+DP/PT pulse bilaterally Musculoskeletal: Left hip: two incisions non-tender, no erythema or drainage. Skin: No rashes.  Neuro: Alert and oriented X3, cranial nerves II-XII grossly intact, muscle strength 5/5 in all extremeties, sensation to light touch intact.  Psych: Patient is not psychotic, no suicidal or hemocidal ideation.  Labs on Admission:  Basic Metabolic Panel:  Recent Labs Lab 12/04/14 1542  NA 139  K 3.2*  CL 104  CO2 27  GLUCOSE 111*  BUN 14  CREATININE 0.76  CALCIUM 8.6   Liver Function Tests: No results for input(s): AST, ALT, ALKPHOS, BILITOT, PROT, ALBUMIN in the last 168 hours. No results for input(s): LIPASE, AMYLASE in the last 168 hours. No results for input(s): AMMONIA in the last 168 hours. CBC:  Recent Labs Lab 12/04/14 1542  WBC 8.9  NEUTROABS 5.8  HGB 9.5*  HCT  29.7*  MCV 96.7  PLT 293   Cardiac Enzymes: No results for input(s): CKTOTAL, CKMB, CKMBINDEX, TROPONINI in the last 168 hours.  BNP (last 3 results) No results for input(s): PROBNP in the last 8760 hours. CBG: No results for input(s): GLUCAP in the last 168 hours.  Radiological Exams on Admission: No results found.  EKG: will get one   Assessment/Plan Principal Problem:   UTI (lower urinary tract infection) Active Problems:   Diabetes mellitus without complication   Hypokalemia   Non-ST elevation myocardial infarction (NSTEMI)   HTN (hypertension)   UTI (urinary tract infection)   Intertrochanteric fracture of left hip   Diastolic congestive heart failure   Constipation  UTI: Patient has positive urinalysis for UTI. The blood on her diaper is likely from urine. FOBT negative. Currently patient is hemodynamically stable, not septic. -will admit to med-surg bed -treat with IV Rocephin -Follow-up urine and blood culture -cbc q8h hour, transfuse prn  Diastolic congestive heart failure: 2-D echo on 01/05/13 showed EF of 55-65% with grade 1 diastolic dysfunction. Currently patient is on Lasix at home. She is clinically euvolemic. -Hold Lasix in the setting of UTI  -check BNP  Diabetes mellitus: Last A1c was 6.2 on 11/14/14. Patient is on Lantus, Actos, and metformin at home -Continue Lantus 5 units bid -Switch to oral medications to sliding scale insulin  CAD: s/p of stent 12/2012. Used to be on ASA and Plavix which were discontinued recently. Currently patient does not have any chest pain. Because of hematuria, I will not restart aspirin and Plavix at this moment. -Get EKG -Continue metoprolol -Continue nitroglycerin when necessary -Continue Zocor  HTN:  -Hold Lasix as above -Continue Cardizem, and metoprolol  Hypokalemia: Potassium 3.2 on admission -Repleted: 40 mEq of potassium chloride  Constipation: patient is on Colace at home, she has been constipated in past 3  days. -Continue Colace -Add MiraLAX daily when necessary   DVT ppx: SCD   Code Status: Patient wants to be on DNR Family Communication: None at bed side.         Disposition Plan: Admit to inpatient   Date of Service 12/04/2014    Ivor Costa [facility] Pager [phone]  If 7PM-7AM, please contact night-coverage [email or web address] Password [email or web address] 12/04/2014, 8:22 PM

## 2014-12-04 NOTE — ED Notes (Signed)
Per EMS, pt has had intermittent vaginal bleeding for the past 4 days. Pt is currently in [facility] after breaking her left hip last month. Pt denies pain at this time, states she feels weak.

## 2014-12-04 NOTE — Progress Notes (Signed)
CSW met with patient at bedside. Nurse CM was present. There was no family present.  Patient presents to the ED with intermittent vaginal bleeding. Earlier this evening the patient stated she did not wish to return to [facility]. Patient now wishes to return back to [facility] upon discharge.    CSW called [facility] nursing [facility] to ensure that patient still has a bed. Per nurse Roselyn Reef of [facility], Patient still has a bed waiting at the facility.  Willette Brace [phone] ED CSW 12/04/2014 9:08 PM

## 2014-12-04 NOTE — ED Provider Notes (Signed)
CSN: [id number]     Arrival date & time 12/04/14  1428 History   First MD Initiated Contact with Patient 12/04/14 1502     Chief Complaint  Patient presents with  . Vaginal Bleeding     (Consider location/radiation/quality/duration/timing/severity/associated sxs/prior Treatment) HPI Comments: The patient is a 69 year old female with a past medical history of DM, SVT, recent left hip fx s/p ORIF 11/14/2014, presenting to the ED from [facility] facility with a chief complaint of blood in adult diaper and weakness. Patient reports blood in diaper for several days, unsure if it is from her urine, vagina, or rectum.  Per nursing home hematuria onset 1/4. She reports having to hold her urine for several days due to lack of staff to help her get on the bed pan.  She reports associated urgency and lower abdominal pain.  She reports constipation for 2-3 days. One episode of nausea without emesis.  Denies ASA, anticoagulation therapy, or antiplatelet therapy.  Patient reports associated generalized weakness.  Attributes weakness to currently living situation and recent spouse death. Per [facility] paperwork pt Hbg also trending down. 1/7: 9.5,1/6: 10.7, 1/5: 11.3, 1/4: 12.1, 1/3: 12.4  Pt also requesting placement at another facility.  The history is provided by the patient. No language interpreter was used.    Past Medical History  Diagnosis Date  . Diabetes mellitus   . Hypertension   . Hypercholesteremia   . CAD (coronary artery disease)     a. NSTEMI in setting of UTI and SVT 12/2012 => LHC 01/08/13: Proximal LAD 30%, ostial diagonal 30-40%, proximal RCA 95%, inferior AK, EF 45-50% => PCI: Promus DES x 2 to RCA;  b. Echo 01/05/13: EF 25-36%, grade 1 diastolic dysfunction, MAC   . SVT (supraventricular tachycardia)   . Rectal ulceration     colonoscopy 12/2012   Past Surgical History  Procedure Laterality Date  . Eye surgery      laser , cat bil  . Tubal ligation    . Kyphoplasty  12/27/2012   Procedure: KYPHOPLASTY;  Surgeon: Sinclair Ship, MD;  Location: [place];  Service: Orthopedics;  Laterality: Bilateral;  T-8 kyphoplasty  . Colonoscopy Left 01/12/2013    Procedure: COLONOSCOPY;  Surgeon: Wonda Horner, MD;  Location: [facility] ENDOSCOPY;  Service: Endoscopy;  Laterality: Left;  . Colonoscopy N/A 01/14/2013    Procedure: COLONOSCOPY;  Surgeon: Wonda Horner, MD;  Location: [facility] ENDOSCOPY;  Service: Endoscopy;  Laterality: N/A;  Rm 2034, attemped Colon on 2-15=inadequate prep  . Coronary angioplasty  12/2012  . Left heart catheterization with coronary angiogram N/A 01/08/2013    Procedure: LEFT HEART CATHETERIZATION WITH CORONARY ANGIOGRAM;  Surgeon: Thayer Headings, MD;  Location: [facility] CATH LAB;  Service: Cardiovascular;  Laterality: N/A;  . Percutaneous stent intervention N/A 01/15/2013    Procedure: PERCUTANEOUS STENT INTERVENTION;  Surgeon: Peter M Martinique, MD;  Location: [facility] CATH LAB;  Service: Cardiovascular;  Laterality: N/A;  . Orif hip fracture Left 11/15/2014    Procedure: OPEN REDUCTION INTERNAL FIXATION HIP INTERTROCH;  Surgeon: Mcarthur Rossetti, MD;  Location: [place];  Service: Orthopedics;  Laterality: Left;   Family History  Problem Relation Age of Onset  . Hypertension Brother    History  Substance Use Topics  . Smoking status: Never Smoker   . Smokeless tobacco: Never Used  . Alcohol Use: No   OB History    No data available     Review of Systems  Constitutional: Negative for  fever and chills.  Gastrointestinal: Positive for abdominal pain and constipation. Negative for nausea, vomiting and diarrhea.  Genitourinary: Positive for urgency and hematuria.  Neurological: Positive for weakness.      Allergies  Review of patient's allergies indicates no known allergies.  Home Medications   Prior to Admission medications   Medication Sig Start Date End Date Taking? Authorizing Provider  Amino Acids-Protein Hydrolys (FEEDING SUPPLEMENT, PRO-STAT SUGAR FREE  64,) LIQD Take 30 mLs by mouth daily.   Yes Historical Provider, MD  Cholecalciferol (VITAMIN D3) 1000 UNITS CAPS Take 2,000 Units by mouth every morning.    Yes Historical Provider, MD  diltiazem (CARDIZEM CD) 180 MG 24 hr capsule Take 1 capsule (180 mg total) by mouth daily. 11/18/14  Yes Kelvin Cellar, MD  docusate sodium 100 MG CAPS Take 100 mg by mouth 2 (two) times daily. 11/18/14  Yes Kelvin Cellar, MD  furosemide (LASIX) 40 MG tablet Take 40 mg by mouth every morning.    Yes Historical Provider, MD  insulin glargine (LANTUS) 100 UNIT/ML injection Inject 0.05 mLs (5 Units total) into the skin 2 (two) times daily. 11/13/13 06/23/15 Yes Christina P Rama, MD  metFORMIN (GLUCOPHAGE) 1000 MG tablet Take 1,000 mg by mouth 2 (two) times daily with a meal.   Yes Historical Provider, MD  metoprolol (LOPRESSOR) 25 MG tablet Take 3 tablets (75 mg total) by mouth 2 (two) times daily. 11/18/14  Yes Kelvin Cellar, MD  Multiple Vitamin (MULITIVITAMIN WITH MINERALS) TABS Take 1 tablet by mouth every morning.    Yes Historical Provider, MD  nitroGLYCERIN (NITROSTAT) 0.4 MG SL tablet Place 1 tablet (0.4 mg total) under the tongue every 5 (five) minutes as needed for chest pain. 04/16/13  Yes Scott T Kathlen Mody, PA-C  ondansetron (ZOFRAN ODT) 8 MG disintegrating tablet Take 1 tablet (8 mg total) by mouth every 8 (eight) hours as needed for nausea. 12/10/12  Yes Harden Mo, MD  oxyCODONE-acetaminophen (ROXICET) 5-325 MG per tablet Take 1-2 tablets by mouth every 4 (four) hours as needed for severe pain. 11/16/14  Yes Erskine Emery, PA-C  pioglitazone (ACTOS) 15 MG tablet Take 1 tablet (15 mg total) by mouth daily. 11/18/14  Yes Kelvin Cellar, MD  simvastatin (ZOCOR) 40 MG tablet Take 40 mg by mouth at bedtime.   Yes Historical Provider, MD  aspirin EC 81 MG tablet Take 1 tablet (81 mg total) by mouth daily. Patient not taking: Reported on 12/04/2014 04/16/13   Liliane Shi, PA-C  clopidogrel (PLAVIX) 75 MG  tablet Take 1 tablet (75 mg total) by mouth daily with breakfast. Patient not taking: Reported on 12/04/2014 01/17/13   Hosie Poisson, MD   BP 125/56 mmHg  Pulse 72  Temp(Src) 98.1 F (36.7 C) (Oral)  Resp 18  SpO2 93% Physical Exam  Constitutional: She is oriented to person, place, and time. She appears well-developed and well-nourished. No distress.  HENT:  Head: Normocephalic and atraumatic.  Eyes: EOM are normal. Pupils are equal, round, and reactive to light.  Neck: Neck supple.  Cardiovascular: Normal rate and regular rhythm.   Pulmonary/Chest: Effort normal. No respiratory distress.  Abdominal: Soft. There is tenderness. There is no rebound and no guarding.  Mild lower abdominal discomfort with palpation.  Genitourinary:  Tan stool on glove.  Bloody fluid in adult diaper and on bed. No active bleeding on perineal exam. Chaperone present.   Musculoskeletal:  Left hip: two incisions non-tender, no erythema or drainage.  Neurological: She is alert and  oriented to person, place, and time.  Skin: Skin is warm and dry. She is not diaphoretic.  Psychiatric: She has a normal mood and affect. Her behavior is normal.  Nursing note and vitals reviewed.   ED Course  Procedures (including critical care time) Labs Review Labs Reviewed  CBC WITH DIFFERENTIAL - Abnormal; Notable for the following:    RBC 3.07 (*)    Hemoglobin 9.5 (*)    HCT 29.7 (*)    All other components within normal limits  URINALYSIS, ROUTINE W REFLEX MICROSCOPIC - Abnormal; Notable for the following:    Color, Urine RED (*)    APPearance TURBID (*)    Hgb urine dipstick LARGE (*)    Bilirubin Urine LARGE (*)    Ketones, ur 40 (*)    Protein, ur >300 (*)    Nitrite POSITIVE (*)    Leukocytes, UA LARGE (*)    All other components within normal limits  URINE MICROSCOPIC-ADD ON - Abnormal; Notable for the following:    Squamous Epithelial / LPF FEW (*)    Bacteria, UA MANY (*)    All other components within  normal limits  URINE CULTURE  APTT  PROTIME-INR  BASIC METABOLIC PANEL  OCCULT BLOOD X 1 CARD TO LAB, STOOL  POC OCCULT BLOOD, ED    Imaging Review No results found.   EKG Interpretation None      MDM   Final diagnoses:  UTI (lower urinary tract infection)  Hematuria  Anemia due to blood loss   Pt with down trending hbg and hematuria.  Likely secondary to UTI, med rec without anticoagulation, antiplatelet or ASA.  Plavix is listed, however it is not on imaging or from facility. After exam likely coming from urine Hemoccult negative no active bleeding per vagina on rectal and perineal exam. Dr. Regenia Skeeter also evaluated the patient. UA shows infection, culture sent, antibiotic started. Hemoglobin 9.5, down trending per outside records 3g in 4 days. Plan to admit for UTI and serial CBC checks. Discussed with Dr. Blaine Hamper, agrees to admit the patient.  Harvie Heck, PA-C 12/04/14 1931  Ephraim Hamburger, MD 12/07/14 [phone]

## 2014-12-05 ENCOUNTER — Encounter: Payer: Medicare Other | Admitting: Nurse Practitioner

## 2014-12-05 DIAGNOSIS — A419 Sepsis, unspecified organism: Principal | ICD-10-CM

## 2014-12-05 LAB — GLUCOSE, CAPILLARY
GLUCOSE-CAPILLARY: 146 mg/dL — AB (ref 70–99)
Glucose-Capillary: 128 mg/dL — ABNORMAL HIGH (ref 70–99)
Glucose-Capillary: 158 mg/dL — ABNORMAL HIGH (ref 70–99)
Glucose-Capillary: 172 mg/dL — ABNORMAL HIGH (ref 70–99)

## 2014-12-05 LAB — CBC
HCT: 28.7 % — ABNORMAL LOW (ref 36.0–46.0)
HCT: 32.4 % — ABNORMAL LOW (ref 36.0–46.0)
HEMATOCRIT: 29.9 % — AB (ref 36.0–46.0)
Hemoglobin: 9.4 g/dL — ABNORMAL LOW (ref 12.0–15.0)
Hemoglobin: 9.7 g/dL — ABNORMAL LOW (ref 12.0–15.0)
Hemoglobin: 10.4 g/dL — ABNORMAL LOW (ref 12.0–15.0)
MCH: 31.3 pg (ref 26.0–34.0)
MCH: 31.5 pg (ref 26.0–34.0)
MCH: 31.3 pg (ref 26.0–34.0)
MCHC: 32.8 g/dL (ref 30.0–36.0)
MCHC: 32.4 g/dL (ref 30.0–36.0)
MCHC: 32.1 g/dL (ref 30.0–36.0)
MCV: 95.7 fL (ref 78.0–100.0)
MCV: 97.1 fL (ref 78.0–100.0)
MCV: 97.6 fL (ref 78.0–100.0)
PLATELETS: 255 10*3/uL (ref 150–400)
PLATELETS: 247 10*3/uL (ref 150–400)
PLATELETS: 293 10*3/uL (ref 150–400)
RBC: 3 MIL/uL — ABNORMAL LOW (ref 3.87–5.11)
RBC: 3.08 MIL/uL — ABNORMAL LOW (ref 3.87–5.11)
RBC: 3.32 MIL/uL — AB (ref 3.87–5.11)
RDW: 14.5 % (ref 11.5–15.5)
RDW: 14.8 % (ref 11.5–15.5)
RDW: 14.9 % (ref 11.5–15.5)
WBC: 12.7 10*3/uL — ABNORMAL HIGH (ref 4.0–10.5)
WBC: 11 10*3/uL — ABNORMAL HIGH (ref 4.0–10.5)
WBC: 10.1 10*3/uL (ref 4.0–10.5)

## 2014-12-05 LAB — COMPREHENSIVE METABOLIC PANEL
ALBUMIN: 2.8 g/dL — AB (ref 3.5–5.2)
ALK PHOS: 87 U/L (ref 39–117)
ALT: 21 U/L (ref 0–35)
ANION GAP: 8 (ref 5–15)
AST: 18 U/L (ref 0–37)
BUN: 13 mg/dL (ref 6–23)
CO2: 30 mmol/L (ref 19–32)
Calcium: 8.3 mg/dL — ABNORMAL LOW (ref 8.4–10.5)
Chloride: 98 mEq/L (ref 96–112)
Creatinine, Ser: 0.65 mg/dL (ref 0.50–1.10)
GFR calc Af Amer: >90 mL/min (ref >90)
GFR calc non Af Amer: 88 mL/min — ABNORMAL LOW (ref >90)
Glucose, Bld: 144 mg/dL — ABNORMAL HIGH (ref 70–99)
POTASSIUM: 3.1 mmol/L — AB (ref 3.5–5.1)
Sodium: 136 mmol/L (ref 135–145)
TOTAL PROTEIN: 5.8 g/dL — AB (ref 6.0–8.3)
Total Bilirubin: 0.6 mg/dL (ref 0.3–1.2)

## 2014-12-05 LAB — MRSA PCR SCREENING: MRSA by PCR: NEGATIVE

## 2014-12-05 LAB — BRAIN NATRIURETIC PEPTIDE: B Natriuretic Peptide: 76.4 pg/mL (ref 0.0–100.0)

## 2014-12-05 MED ORDER — PIOGLITAZONE HCL 15 MG PO TABS
15.0000 mg | ORAL_TABLET | Freq: Every day | ORAL | Status: DC
  Administered 2014-12-05 – 2014-12-08 (×4): 15 mg via ORAL
  Filled 2014-12-05 (×4): qty 1

## 2014-12-05 MED ORDER — FUROSEMIDE 40 MG PO TABS
40.0000 mg | ORAL_TABLET | Freq: Every morning | ORAL | Status: DC
  Administered 2014-12-06 – 2014-12-08 (×3): 40 mg via ORAL
  Filled 2014-12-05 (×3): qty 1

## 2014-12-05 MED ORDER — CLOPIDOGREL BISULFATE 75 MG PO TABS: 75.0000 mg | ORAL_TABLET | Freq: Every day | ORAL | Status: DC

## 2014-12-05 MED ORDER — POTASSIUM CHLORIDE CRYS ER 20 MEQ PO TBCR
40.0000 meq | EXTENDED_RELEASE_TABLET | Freq: Two times a day (BID) | ORAL | Status: AC
  Administered 2014-12-05 (×2): 40 meq via ORAL
  Filled 2014-12-05 (×3): qty 2

## 2014-12-05 MED ORDER — METFORMIN HCL 500 MG PO TABS
1000.0000 mg | ORAL_TABLET | Freq: Two times a day (BID) | ORAL | Status: DC
  Administered 2014-12-05 – 2014-12-08 (×6): 1000 mg via ORAL
  Filled 2014-12-05 (×9): qty 2

## 2014-12-05 MED ORDER — FUROSEMIDE 40 MG PO TABS
40.0000 mg | ORAL_TABLET | Freq: Every morning | ORAL | Status: DC
  Filled 2014-12-05: qty 1

## 2014-12-05 NOTE — Progress Notes (Signed)
OT Cancellation Note  Patient Details Name: SHAARON GOLLIDAY MRN: [id number] DOB: 10/06/1946   Cancelled Treatment:    Reason Eval/Treat Not Completed: OT screened, no needs identified, will sign off - Pt admitted from SNF with plan to discharge back to SNF.   All OT needs can be met at [facility]  Darlina Rumpf [place], OTR/L [phone]  12/05/2014, 1:23 PM

## 2014-12-05 NOTE — Progress Notes (Addendum)
Clinical Social Work Department BRIEF PSYCHOSOCIAL ASSESSMENT 12/05/2014  Patient:  [name], [name]     Account Number:  [id number]     Admit date:  12/04/2014  Clinical Social Worker:  Earlie Server  Date/Time:  12/05/2014 02:00 PM  Referred by:  Physician  Date Referred:  12/05/2014 Referred for  SNF Placement   Other Referral:   Interview type:  Patient Other interview type:    PSYCHOSOCIAL DATA Living Status:  FACILITY Admitted from facility:   Level of care:   Primary support name:  Dede Primary support relationship to patient:  SIBLING Degree of support available:   Adequate    CURRENT CONCERNS Current Concerns  Post-Acute Placement   Other Concerns:    SOCIAL WORK ASSESSMENT / PLAN CSW received referral due to patient being admitted from a facility. CSW reviewed chart and met with patient at bedside. CSW introduced myself and explained role.    Patient reports she went to [facility] about 2 weeks ago in order to get rehab. Patient reports she enjoys being at [place] and is hopeful to return when medically stable. CSW explained process and that CSW would keep SNF updated on DC plans.    CSW completed FL2 and placed on chart. Patient was discussed during progression meeting and MD reports possible DC over the weekend. CSW left a message with SNF in order to inquire about patient returning.   Assessment/plan status:  Psychosocial Support/Ongoing Assessment of Needs Other assessment/ plan:   Information/referral to community resources:   Will return to SNF at DC    PATIENT'S/FAMILY'S RESPONSE TO PLAN OF CARE: Patient alert and oriented. Patient thanked CSW for coming to visit her and reports she was waiting to talk with someone. Patient spoke about her husband recently passing away and how she is relying on her support through God and her church to get through husband's passing. Patient reports she knows that husband went to heaven and that has given her some peace.  Patient reports she is hopeful to return back home quickly after completing rehab at [facility] [place].       [place], [place] [phone]   Addendum 1500 SNF returned call and reports they can accept patient over the weekend if stable. Weekend CSW to Biomedical scientist at [facility] at [phone]. Signed FL2 and DNR on chart. CSW will leave handoff for weekend CSW.

## 2014-12-05 NOTE — Progress Notes (Signed)
[facility] PROGRESS NOTE  Assessment/Plan: Sepsis due to UTI (urinary tract infection) - Started on IV rocephin, UC pending. We will wait for UC result before de-escalating treatment. - Has remained afebrile.  Diastolic congestive heart failure:  - 2-D echo on 01/05/13 showed EF of 55-65% with grade 1 diastolic dysfunction.  - Resume home dose lasix in am  Diabetes mellitus:  - Last A1c was 6.2 on 11/14/14.  - Resume metformin and actos.  CAD:  -  ASA and Plavix which were discontinued recently.  - Continue metoprolol - Continue nitroglycerin when necessary - Continue Zocor  HTN:  - Hold Lasix as above - Continue Cardizem, and metoprolol  Hypokalemia:  - Potassium 3.2 on admission - Repleted: 40 mEq of potassium chloride  Constipation:  - Patient is on Colace at home, she has been constipated in past 3 days. - Continue Colace - Add MiraLAX daily when necessary    Code Status: Patient wants to be on DNR Family Communication: None at bed side.  Disposition Plan: Admit to inpatient   Consultants:  none  Procedures:  none  Antibiotics:  Rocephin 1.7.2016  HPI/Subjective: No compalins  Objective: Filed Vitals:   12/04/14 1605 12/04/14 2030 12/04/14 2300 12/05/14 0548  BP: 146/55 132/67  140/62  Pulse: 75 72  80  Temp:  97.6 F (36.4 C)  98.2 F (36.8 C)  TempSrc:  Oral  Oral  Resp: 18 18  18   Height:   5\' 8"  (1.727 m)   Weight:   96.5 kg (212 lb 11.9 oz)   SpO2: 92% 93%  92%    Intake/Output Summary (Last 24 hours) at 12/05/14 1030 Last data filed at 12/05/14 0815  Gross per 24 hour  Intake    120 ml  Output      0 ml  Net    120 ml   Filed Weights   12/04/14 2300  Weight: 96.5 kg (212 lb 11.9 oz)    Exam:  General: Alert, awake, oriented x3, in no acute distress.  HEENT: No bruits, no goiter.  Heart: Regular rate and rhythm, without murmurs, rubs, gallops.  Lungs: Good air movement, bilateral air movement.    Abdomen: Soft, nontender, nondistended, positive bowel sounds.  Neuro: Grossly intact, nonfocal.   Data Reviewed: Basic Metabolic Panel:  Recent Labs Lab 12/04/14 1542 12/05/14 0145  NA 139 136  K 3.2* 3.1*  CL 104 98  CO2 27 30  GLUCOSE 111* 144*  BUN 14 13  CREATININE 0.76 0.65  CALCIUM 8.6 8.3*   Liver Function Tests:  Recent Labs Lab 12/05/14 0145  AST 18  ALT 21  ALKPHOS 87  BILITOT 0.6  PROT 5.8*  ALBUMIN 2.8*   No results for input(s): LIPASE, AMYLASE in the last 168 hours. No results for input(s): AMMONIA in the last 168 hours. CBC:  Recent Labs Lab 12/04/14 1542 12/05/14 0145 12/05/14 0852  WBC 8.9 12.7* 11.0*  NEUTROABS 5.8  --   --   HGB 9.5* 9.4* 9.7*  HCT 29.7* 28.7* 29.9*  MCV 96.7 95.7 97.1  PLT 293 255 247   Cardiac Enzymes: No results for input(s): CKTOTAL, CKMB, CKMBINDEX, TROPONINI in the last 168 hours. BNP (last 3 results) No results for input(s): PROBNP in the last 8760 hours. CBG:  Recent Labs Lab 12/04/14 2123 12/05/14 0756  GLUCAP 124* 128*    Recent Results (from the past 240 hour(s))  MRSA PCR Screening     Status: None   Collection  Time: 12/05/14 12:28 AM  Result Value Ref Range Status   MRSA by PCR NEGATIVE NEGATIVE Final    Comment:        The GeneXpert MRSA Assay (FDA approved for NASAL specimens only), is one component of a comprehensive MRSA colonization surveillance program. It is not intended to diagnose MRSA infection nor to guide or monitor treatment for MRSA infections.      Studies: No results found.  Scheduled Meds: . atorvastatin  20 mg Oral QHS  . cefTRIAXone (ROCEPHIN)  IV  1 g Intravenous Q24H  . cholecalciferol  2,000 Units Oral Daily  . diltiazem  180 mg Oral Daily  . docusate sodium  100 mg Oral BID  . feeding supplement (PRO-STAT SUGAR FREE 64)  30 mL Oral Daily  . insulin aspart  0-9 Units Subcutaneous TID WC  . insulin glargine  5 Units Subcutaneous BID  . metoprolol  75 mg  Oral BID  . multivitamin with minerals  1 tablet Oral q morning - 10a  . potassium chloride  40 mEq Oral BID  . sodium chloride  3 mL Intravenous Q12H   Continuous Infusions:    Charlynne Cousins  [facility] Pager [phone]. If 8PM-8AM, please contact night-coverage at [email or web address], password [facility] 12/05/2014, 10:30 AM  LOS: 1 day

## 2014-12-05 NOTE — Progress Notes (Signed)
UR complete 

## 2014-12-05 NOTE — Progress Notes (Signed)
PT Cancellation Note  Patient Details Name: [name] MRN: [id number] DOB: [birth year]   Cancelled Treatment:    Reason Eval/Treat Not Completed: Patient declined, no reason specified   [facility] 12/05/2014, 10:54 AM

## 2014-12-06 DIAGNOSIS — I5032 Chronic diastolic (congestive) heart failure: Secondary | ICD-10-CM

## 2014-12-06 LAB — CBC
HCT: 28.6 % — ABNORMAL LOW (ref 36.0–46.0)
HCT: 30.2 % — ABNORMAL LOW (ref 36.0–46.0)
HEMOGLOBIN: 9.2 g/dL — AB (ref 12.0–15.0)
Hemoglobin: 9.7 g/dL — ABNORMAL LOW (ref 12.0–15.0)
MCH: 31.1 pg (ref 26.0–34.0)
MCH: 31.3 pg (ref 26.0–34.0)
MCHC: 32.2 g/dL (ref 30.0–36.0)
MCHC: 32.1 g/dL (ref 30.0–36.0)
MCV: 96.6 fL (ref 78.0–100.0)
MCV: 97.4 fL (ref 78.0–100.0)
PLATELETS: 257 10*3/uL (ref 150–400)
PLATELETS: 258 10*3/uL (ref 150–400)
RBC: 2.96 MIL/uL — AB (ref 3.87–5.11)
RBC: 3.1 MIL/uL — ABNORMAL LOW (ref 3.87–5.11)
RDW: 14.8 % (ref 11.5–15.5)
RDW: 15 % (ref 11.5–15.5)
WBC: 9.4 10*3/uL (ref 4.0–10.5)
WBC: 7.1 10*3/uL (ref 4.0–10.5)

## 2014-12-06 LAB — GLUCOSE, CAPILLARY
Glucose-Capillary: 145 mg/dL — ABNORMAL HIGH (ref 70–99)
Glucose-Capillary: 126 mg/dL — ABNORMAL HIGH (ref 70–99)
Glucose-Capillary: 141 mg/dL — ABNORMAL HIGH (ref 70–99)
Glucose-Capillary: 129 mg/dL — ABNORMAL HIGH (ref 70–99)

## 2014-12-06 NOTE — Evaluation (Signed)
Physical Therapy Evaluation Patient Details Name: [name] MRN: [id number] DOB: 09/15/1946 Today's Date: 12/06/2014   History of Present Illness  [name] is a 69 y.o. female with past medical history of DM, SVT, recent left hip fx s/p ORIF 11/14/2014, presenting to the ED from [facility] facility with a chief complaint of bleeding from peri-area and weakness.   Clinical Impression  Recommend return to SNF; will defer further therapy to SNF, pt refused PT yesterday and attempted to refuse today, did agree to sit EOB with max encouragement; She states she only wants to continue her therapy at [facility], attempted to explain risks/benefits to no avail; will sign off    Follow Up Recommendations SNF    Equipment Recommendations  None recommended by PT    Recommendations for Other Services       Precautions / Restrictions Precautions Precautions: Fall Restrictions LLE Weight Bearing: Weight bearing as tolerated      Mobility  Bed Mobility Overal bed mobility: Needs Assistance Bed Mobility: Supine to Sit;Sit to Supine     Supine to sit: Min assist Sit to supine: Mod assist   General bed mobility comments: assist for  L LE management. required significant increase in time, reaches repeatedly for therapist's hand does not self assist with direction  Transfers                 General transfer comment: pt refused   Ambulation/Gait             General Gait Details: pt refused  Stairs            Wheelchair Mobility    Modified Rankin (Stroke Patients Only)       Balance Overall balance assessment: Needs assistance;History of Falls   Sitting balance-Leahy Scale: Fair                                       Pertinent Vitals/Pain Pain Assessment: 0-10 Pain Score: 2  Pain Location: L hip Pain Descriptors / Indicators: Constant Pain Intervention(s): Limited activity within patient's tolerance    Home Living  Family/patient expects to be discharged to:: Skilled nursing facility                      Prior Function Level of Independence: Needs assistance   Gait / Transfers Assistance Needed: amb 56' with RW and  PT at [facility]        Extremity/Trunk Assessment               Lower Extremity Assessment: Generalized weakness (L weaker than R)         Communication      Cognition Arousal/Alertness: Awake/alert Behavior During Therapy:  (seems to be easily agitated) Overall Cognitive Status: No family/caregiver present to determine baseline cognitive functioning                      General Comments      Exercises        Assessment/Plan    PT Assessment All further PT needs can be met in the next venue of care  PT Diagnosis Generalized weakness   PT Problem List    PT Treatment Interventions     PT Goals (Current goals can be found in the Care Plan section)  Frequency     Barriers to discharge        Co-evaluation               End of Session   Activity Tolerance: Patient limited by fatigue Patient left: in bed;with call bell/phone within reach;with bed alarm set           Time: 2446-2863 PT Time Calculation (min) (ACUTE ONLY): 18 min   Charges:   PT Evaluation $Initial PT Evaluation Tier I: 1 Procedure PT Treatments $Therapeutic Activity: 8-22 mins   PT G Codes:        Rehman Levinson December 31, 2014, 6:33 PM

## 2014-12-06 NOTE — Progress Notes (Signed)
[facility] PROGRESS NOTE  Assessment/Plan: Sepsis due to UTI (urinary tract infection) - Started on IV rocephin, UC pending. We will wait for UC result before de-escalating treatment. - Has remained afebrile. - BC negative till date.  Diastolic congestive heart failure:  - 2-D echo on 01/05/13 showed EF of 55-65% with grade 1 diastolic dysfunction.  - Resume home dose lasix in am  Diabetes mellitus:  - Last A1c was 6.2 on 11/14/14.  - Resume metformin and actos.  CAD:  -  ASA and Plavix which were discontinued recently.  - Continue metoprolol - Continue nitroglycerin when necessary - Continue Zocor  HTN:  - Hold Lasix as above - Continue Cardizem, and metoprolol  Hypokalemia:  - repeat b-met - Repleted: 40 mEq of potassium chloride  Constipation:  - Patient is on Colace at home, she has been constipated in past 3 days. - Continue Colace - Add MiraLAX daily when necessary    Code Status: Patient wants to be on DNR Family Communication: None at bed side.  Disposition Plan: Admit to inpatient   Consultants:  none  Procedures:  none  Antibiotics:  Rocephin 1.7.2016  HPI/Subjective: No compalins  Objective: Filed Vitals:   12/05/14 0548 12/05/14 1430 12/05/14 2128 12/06/14 0629  BP: 140/62 134/59 120/62 136/56  Pulse: 80 69 72 71  Temp: 98.2 F (36.8 C) 98.2 F (36.8 C) 97.9 F (36.6 C) 98.4 F (36.9 C)  TempSrc: Oral Oral Oral Oral  Resp: _0 Height:      Weight:    96.3 kg (212 lb 4.9 oz)  SpO2: 92% 97% 92% 96%    Intake/Output Summary (Last 24 hours) at 12/06/14 1020 Last data filed at 12/06/14 0900  Gross per 24 hour  Intake    600 ml  Output    850 ml  Net   -250 ml   Filed Weights   12/04/14 2300 12/06/14 0629  Weight: 96.5 kg (212 lb 11.9 oz) 96.3 kg (212 lb 4.9 oz)    Exam:  General: Alert, awake, oriented x3, in no acute distress.  HEENT: No bruits, no goiter.  Heart: Regular rate and rhythm,  without murmurs, rubs, gallops.  Lungs: Good air movement, bilateral air movement.  Abdomen: Soft, nontender, nondistended, positive bowel sounds.  Neuro: Grossly intact, nonfocal.   Data Reviewed: Basic Metabolic Panel:  Recent Labs Lab 12/04/14 1542 12/05/14 0145  NA 139 136  K 3.2* 3.1*  CL 104 98  CO2 27 30  GLUCOSE 111* 144*  BUN 14 13  CREATININE 0.76 0.65  CALCIUM 8.6 8.3*   Liver Function Tests:  Recent Labs Lab 12/05/14 0145  AST 18  ALT 21  ALKPHOS 87  BILITOT 0.6  PROT 5.8*  ALBUMIN 2.8*   No results for input(s): LIPASE, AMYLASE in the last 168 hours. No results for input(s): AMMONIA in the last 168 hours. CBC:  Recent Labs Lab 12/04/14 1542 12/05/14 0145 12/05/14 0852 12/05/14 1712 12/06/14 0106 12/06/14 0854  WBC 8.9 12.7* 11.0* 10.1 9.4 7.1  NEUTROABS 5.8  --   --   --   --   --   HGB 9.5* 9.4* 9.7* 10.4* 9.2* 9.7*  HCT 29.7* 28.7* 29.9* 32.4* 28.6* 30.2*  MCV 96.7 95.7 97.1 97.6 96.6 97.4  PLT 293 255 247 293 257 258   Cardiac Enzymes: No results for input(s): CKTOTAL, CKMB, CKMBINDEX, TROPONINI in the last 168 hours. BNP (last 3 results) No results for input(s): PROBNP in  the last 8760 hours. CBG:  Recent Labs Lab 12/05/14 0756 12/05/14 1232 12/05/14 1733 12/05/14 2139 12/06/14 0730  GLUCAP 128* 158* 172* 146* 145*    Recent Results (from the past 240 hour(s))  MRSA PCR Screening     Status: None   Collection Time: 12/05/14 12:28 AM  Result Value Ref Range Status   MRSA by PCR NEGATIVE NEGATIVE Final    Comment:        The GeneXpert MRSA Assay (FDA approved for NASAL specimens only), is one component of a comprehensive MRSA colonization surveillance program. It is not intended to diagnose MRSA infection nor to guide or monitor treatment for MRSA infections.      Studies: No results found.  Scheduled Meds: . atorvastatin  20 mg Oral QHS  . cefTRIAXone (ROCEPHIN)  IV  1 g Intravenous Q24H  . cholecalciferol   2,000 Units Oral Daily  . diltiazem  180 mg Oral Daily  . docusate sodium  100 mg Oral BID  . feeding supplement (PRO-STAT SUGAR FREE 64)  30 mL Oral Daily  . furosemide  40 mg Oral q morning - 10a  . insulin aspart  0-9 Units Subcutaneous TID WC  . insulin glargine  5 Units Subcutaneous BID  . metFORMIN  1,000 mg Oral BID WC  . metoprolol  75 mg Oral BID  . multivitamin with minerals  1 tablet Oral q morning - 10a  . pioglitazone  15 mg Oral Daily  . sodium chloride  3 mL Intravenous Q12H   Continuous Infusions:    Charlynne Cousins  [facility] Pager [phone]. If 8PM-8AM, please contact night-coverage at [email or web address], password [facility] 12/06/2014, 10:20 AM  LOS: 2 days

## 2014-12-07 LAB — BASIC METABOLIC PANEL
ANION GAP: 9 (ref 5–15)
BUN: 12 mg/dL (ref 6–23)
CO2: 29 mmol/L (ref 19–32)
CREATININE: 0.58 mg/dL (ref 0.50–1.10)
Calcium: 8.7 mg/dL (ref 8.4–10.5)
Chloride: 99 mEq/L (ref 96–112)
GFR calc Af Amer: >90 mL/min (ref >90)
GFR calc non Af Amer: >90 mL/min (ref >90)
Glucose, Bld: 150 mg/dL — ABNORMAL HIGH (ref 70–99)
Potassium: 3.6 mmol/L (ref 3.5–5.1)
Sodium: 137 mmol/L (ref 135–145)

## 2014-12-07 LAB — GLUCOSE, CAPILLARY
GLUCOSE-CAPILLARY: 87 mg/dL (ref 70–99)
GLUCOSE-CAPILLARY: 138 mg/dL — AB (ref 70–99)
Glucose-Capillary: 181 mg/dL — ABNORMAL HIGH (ref 70–99)
Glucose-Capillary: 148 mg/dL — ABNORMAL HIGH (ref 70–99)

## 2014-12-07 NOTE — Progress Notes (Signed)
[facility] PROGRESS NOTE  Assessment/Plan: Sepsis due to UTI (urinary tract infection) - Started on IV rocephin, UC showed > 100k colonies awaiting speciation. - Has remained afebrile. - BC negative till date.  Diastolic congestive heart failure:  - 2-D echo on 01/05/13 showed EF of 55-65% with grade 1 diastolic dysfunction.  - Resume home dose lasix in am  Diabetes mellitus:  - Last A1c was 6.2 on 11/14/14.  - Resume metformin and actos.  CAD:  -  ASA and Plavix which were discontinued recently.  - Continue metoprolol - Continue nitroglycerin when necessary - Continue Zocor  HTN:  - Resume lasix - Continue Cardizem, and metoprolol  Hypokalemia:  - resolved.  Constipation:  - Add MiraLAX daily when necessary    Code Status: Patient wants to be on DNR Family Communication: None at bed side.  Disposition Plan: Admit to inpatient   Consultants:  none  Procedures:  none  Antibiotics:  Rocephin 1.7.2016  HPI/Subjective: No compalins  Objective: Filed Vitals:   12/06/14 0629 12/06/14 1349 12/06/14 2051 12/07/14 0558  BP: 136/56 127/64 132/51 138/66  Pulse: 71 62 72 70  Temp: 98.4 F (36.9 C) 98.5 F (36.9 C) 97.9 F (36.6 C) 97.9 F (36.6 C)  TempSrc: Oral Oral Oral Oral  Resp: 18 18 18 19   Height:      Weight: 96.3 kg (212 lb 4.9 oz)   96.5 kg (212 lb 11.9 oz)  SpO2: 96% 96% 97% 94%    Intake/Output Summary (Last 24 hours) at 12/07/14 0931 Last data filed at 12/07/14 0730  Gross per 24 hour  Intake    240 ml  Output   2779 ml  Net  -2539 ml   Filed Weights   12/04/14 2300 12/06/14 0629 12/07/14 0558  Weight: 96.5 kg (212 lb 11.9 oz) 96.3 kg (212 lb 4.9 oz) 96.5 kg (212 lb 11.9 oz)    Exam:  General: Alert, awake, oriented x3, in no acute distress.  HEENT: No bruits, no goiter.  Heart: Regular rate and rhythm. Abdomen: Soft, nontender, nondistended, positive bowel sounds.  .   Data Reviewed: Basic Metabolic  Panel:  Recent Labs Lab 12/04/14 1542 12/05/14 0145 12/07/14 0528  NA 139 136 137  K 3.2* 3.1* 3.6  CL 104 98 99  CO2 27 30 29   GLUCOSE 111* 144* 150*  BUN 14 13 12   CREATININE 0.76 0.65 0.58  CALCIUM 8.6 8.3* 8.7   Liver Function Tests:  Recent Labs Lab 12/05/14 0145  AST 18  ALT 21  ALKPHOS 87  BILITOT 0.6  PROT 5.8*  ALBUMIN 2.8*   No results for input(s): LIPASE, AMYLASE in the last 168 hours. No results for input(s): AMMONIA in the last 168 hours. CBC:  Recent Labs Lab 12/04/14 1542 12/05/14 0145 12/05/14 0852 12/05/14 1712 12/06/14 0106 12/06/14 0854  WBC 8.9 12.7* 11.0* 10.1 9.4 7.1  NEUTROABS 5.8  --   --   --   --   --   HGB 9.5* 9.4* 9.7* 10.4* 9.2* 9.7*  HCT 29.7* 28.7* 29.9* 32.4* 28.6* 30.2*  MCV 96.7 95.7 97.1 97.6 96.6 97.4  PLT 293 255 247 293 257 258   Cardiac Enzymes: No results for input(s): CKTOTAL, CKMB, CKMBINDEX, TROPONINI in the last 168 hours. BNP (last 3 results) No results for input(s): PROBNP in the last 8760 hours. CBG:  Recent Labs Lab 12/06/14 0730 12/06/14 1213 12/06/14 1641 12/06/14 2025 12/07/14 0726  GLUCAP 145* 126* 141* 129* 181*  Recent Results (from the past 240 hour(s))  Urine culture     Status: None (Preliminary result)   Collection Time: 12/04/14  3:32 PM  Result Value Ref Range Status   Specimen Description URINE, CLEAN CATCH  Final   Special Requests NONE  Final   Colony Count   Final    >=100,000 COLONIES/ML Performed at [facility]    Culture   Final    [place] Performed at [facility]    Report Status PENDING  Incomplete  MRSA PCR Screening     Status: None   Collection Time: 12/05/14 12:28 AM  Result Value Ref Range Status   MRSA by PCR NEGATIVE NEGATIVE Final    Comment:        The GeneXpert MRSA Assay (FDA approved for NASAL specimens only), is one component of a comprehensive MRSA colonization surveillance program. It is not intended to diagnose  MRSA infection nor to guide or monitor treatment for MRSA infections.      Studies: No results found.  Scheduled Meds: . atorvastatin  20 mg Oral QHS  . cefTRIAXone (ROCEPHIN)  IV  1 g Intravenous Q24H  . cholecalciferol  2,000 Units Oral Daily  . diltiazem  180 mg Oral Daily  . docusate sodium  100 mg Oral BID  . feeding supplement (PRO-STAT SUGAR FREE 64)  30 mL Oral Daily  . furosemide  40 mg Oral q morning - 10a  . insulin aspart  0-9 Units Subcutaneous TID WC  . insulin glargine  5 Units Subcutaneous BID  . metFORMIN  1,000 mg Oral BID WC  . metoprolol  75 mg Oral BID  . multivitamin with minerals  1 tablet Oral q morning - 10a  . pioglitazone  15 mg Oral Daily  . sodium chloride  3 mL Intravenous Q12H   Continuous Infusions:    Charlynne Cousins  [facility] Pager [phone]. If 8PM-8AM, please contact night-coverage at [email or web address], password [facility] 12/07/2014, 9:31 AM  LOS: 3 days

## 2014-12-08 DIAGNOSIS — K5900 Constipation, unspecified: Secondary | ICD-10-CM

## 2014-12-08 DIAGNOSIS — A4152 Sepsis due to Pseudomonas: Secondary | ICD-10-CM

## 2014-12-08 LAB — BASIC METABOLIC PANEL
Anion gap: 10 (ref 5–15)
BUN: 10 mg/dL (ref 6–23)
CALCIUM: 8.8 mg/dL (ref 8.4–10.5)
CHLORIDE: 98 meq/L (ref 96–112)
CO2: 29 mmol/L (ref 19–32)
Creatinine, Ser: 0.54 mg/dL (ref 0.50–1.10)
GFR calc Af Amer: >90 mL/min (ref >90)
Glucose, Bld: 149 mg/dL — ABNORMAL HIGH (ref 70–99)
Potassium: 3.4 mmol/L — ABNORMAL LOW (ref 3.5–5.1)
Sodium: 137 mmol/L (ref 135–145)

## 2014-12-08 LAB — URINE CULTURE: Colony Count: >=100000

## 2014-12-08 LAB — GLUCOSE, CAPILLARY: Glucose-Capillary: 131 mg/dL — ABNORMAL HIGH (ref 70–99)

## 2014-12-08 MED ORDER — OXYCODONE-ACETAMINOPHEN 5-325 MG PO TABS: 1.0000 | ORAL_TABLET | ORAL | Status: DC | PRN

## 2014-12-08 MED ORDER — CIPROFLOXACIN HCL 500 MG PO TABS
500.0000 mg | ORAL_TABLET | Freq: Two times a day (BID) | ORAL | Status: DC
  Administered 2014-12-08: 500 mg via ORAL
  Filled 2014-12-08 (×3): qty 1

## 2014-12-08 MED ORDER — CIPROFLOXACIN HCL 500 MG PO TABS: 500.0000 mg | ORAL_TABLET | Freq: Two times a day (BID) | ORAL | Status: DC

## 2014-12-08 NOTE — Discharge Summary (Signed)
Physician Discharge Summary  [name] BHA:[id number] DOB: 1946-04-28 DOA: 12/04/2014  PCP: Shirline Frees, MD  Admit date: 12/04/2014 Discharge date: 12/08/2014  Time spent: 35 minutes  Recommendations for Outpatient Follow-up:  1. Follow up with PCP at facility  Discharge Diagnoses:  Principal Problem:   Sepsis Active Problems:   UTI (lower urinary tract infection)   UTI (urinary tract infection)   Diabetes mellitus without complication   Hypokalemia   Non-ST elevation myocardial infarction (NSTEMI)   HTN (hypertension)   Intertrochanteric fracture of left hip   Diastolic congestive heart failure   Constipation   Discharge Condition: stable  Diet recommendation: carb modified  Filed Weights   12/06/14 0629 12/07/14 0558 12/08/14 0500  Weight: 96.3 kg (212 lb 4.9 oz) 96.5 kg (212 lb 11.9 oz) 96.5 kg (212 lb 11.9 oz)    History of present illness:   HPI: [name] is a 69 y.o. female with past medical history of DM, SVT, recent left hip fx s/p ORIF 11/14/2014, presenting to the ED from [facility] with a chief complaint of blood in adult diaper and weakness.   Patient reports blood in diaper for 5 days. It is unsure if it is from her urine, vagina, or rectum. She reports having to hold her urine for several days due to lack of staff to help her get on the bed pan. She reports having urinary urgency, dysuria, and suprapubic abdominal pain. She reports having constipation for 3 days and one episode of nausea without emesis.Patient reports associated generalized weakness. She attributes her weakness to currently living situation and recent spouse death.  Hospital Course:  Sepsis due to Pseudomonas  UTI (urinary tract infection) - Started on IV rocephin, UC showed > 100k pseudomonas sensitive to cipro. - cont cipro for 4 additional days. - Has remained afebrile. - BC negative till date.  Diastolic congestive heart failure:  - 2-D echo on 01/05/13  showed EF of 55-65% with grade 1 diastolic dysfunction.  - Resume home dose lasix in am  Diabetes mellitus:  - Last A1c was 6.2 on 11/14/14.  - Resume metformin and actos.  CAD:  - ASA and Plavix which were discontinued recently.  - Continue metoprolol - Continue nitroglycerin when necessary - Continue Zocor  Procedures:  none  Consultations:  none  Discharge Exam: Filed Vitals:   12/08/14 0500  BP: 143/61  Pulse: 71  Temp: 97.6 F (36.4 C)  Resp: 18    General: A&O x3 Cardiovascular: RRR Respiratory: good air movement CTA B/L  Discharge Instructions   Discharge Instructions    Diet - low sodium heart healthy    Complete by:  As directed      Increase activity slowly    Complete by:  As directed           Current Discharge Medication List    START taking these medications   Details  ciprofloxacin (CIPRO) 500 MG tablet Take 1 tablet (500 mg total) by mouth 2 (two) times daily. Qty: 8 tablet, Refills: 0      CONTINUE these medications which have CHANGED   Details  oxyCODONE-acetaminophen (ROXICET) 5-325 MG per tablet Take 1-2 tablets by mouth every 4 (four) hours as needed for severe pain. Qty: 10 tablet, Refills: 0      CONTINUE these medications which have NOT CHANGED   Details  Amino Acids-Protein Hydrolys (FEEDING SUPPLEMENT, PRO-STAT SUGAR FREE 64,) LIQD Take 30 mLs by mouth daily.    Cholecalciferol (VITAMIN  D3) 1000 UNITS CAPS Take 2,000 Units by mouth every morning.     diltiazem (CARDIZEM CD) 180 MG 24 hr capsule Take 1 capsule (180 mg total) by mouth daily. Qty: 30 capsule, Refills: 1    docusate sodium 100 MG CAPS Take 100 mg by mouth 2 (two) times daily. Qty: 10 capsule, Refills: 0    furosemide (LASIX) 40 MG tablet Take 40 mg by mouth every morning.     insulin glargine (LANTUS) 100 UNIT/ML injection Inject 0.05 mLs (5 Units total) into the skin 2 (two) times daily. Qty: 10 mL, Refills: 12    metFORMIN (GLUCOPHAGE) 1000 MG  tablet Take 1,000 mg by mouth 2 (two) times daily with a meal.    metoprolol (LOPRESSOR) 25 MG tablet Take 3 tablets (75 mg total) by mouth 2 (two) times daily. Qty: 60 tablet, Refills: 1    Multiple Vitamin (MULITIVITAMIN WITH MINERALS) TABS Take 1 tablet by mouth every morning.     nitroGLYCERIN (NITROSTAT) 0.4 MG SL tablet Place 1 tablet (0.4 mg total) under the tongue every 5 (five) minutes as needed for chest pain. Qty: 25 tablet, Refills: 3    ondansetron (ZOFRAN ODT) 8 MG disintegrating tablet Take 1 tablet (8 mg total) by mouth every 8 (eight) hours as needed for nausea. Qty: 20 tablet, Refills: 0    pioglitazone (ACTOS) 15 MG tablet Take 1 tablet (15 mg total) by mouth daily. Qty: 30 tablet, Refills: 1    simvastatin (ZOCOR) 40 MG tablet Take 40 mg by mouth at bedtime.    aspirin EC 81 MG tablet Take 1 tablet (81 mg total) by mouth daily. Qty: 90 tablet, Refills: 0    clopidogrel (PLAVIX) 75 MG tablet Take 1 tablet (75 mg total) by mouth daily with breakfast. Qty: 30 tablet, Refills: 1       No Known Allergies    The results of significant diagnostics from this hospitalization (including imaging, microbiology, ancillary and laboratory) are listed below for reference.    Significant Diagnostic Studies: Dg Hip Complete Left  11/14/2014   CLINICAL DATA:  Patient status post fall. Left hip pain. Initial encounter.  EXAM: LEFT HIP - COMPLETE 2+ VIEW  COMPARISON:  None.  FINDINGS: There is suggestion of a nondisplaced intertrochanteric left proximal femur fracture. The left hip appears located. Cross-table lateral views are limited. SI joints are unremarkable.  IMPRESSION: Findings suggestive of nondisplaced intertrochanteric left proximal femur fracture.   Electronically Signed   By: Lovey Newcomer M.D.   On: 11/14/2014 18:28   Dg Femur Left  11/15/2014   CLINICAL DATA:  Patient status post ORIF left hip.  EXAM: DG C-ARM 61-120 MIN; LEFT FEMUR - 2 VIEW  FLUOROSCOPY TIME:  30  seconds  COMPARISON:  Hip radiograph 11/14/2014  FINDINGS: Patient status post ORIF left intertrochanteric proximal femur fracture. Hardware is visualized on multiple fluoroscopic images.  IMPRESSION: Status post ORIF left intertrochanteric proximal femur fracture.   Electronically Signed   By: Lovey Newcomer M.D.   On: 11/15/2014 11:01   Dg Chest Port 1 View  11/14/2014   CLINICAL DATA:  Preop LEFT hip surgery.  No chest complaints.  EXAM: PORTABLE CHEST - 1 VIEW  COMPARISON:  11/12/2013  FINDINGS: Patient is rotated partially due to the orthopedic injury.  Cardiomegaly. Calcified tortuous aorta. No definite infiltrates or failure. Osteopenia.  IMPRESSION: Cardiomegaly, no active disease.   Electronically Signed   By: Rolla Flatten M.D.   On: 11/14/2014 19:27   Dg  C-arm 1-60 Min  11/15/2014   CLINICAL DATA:  Patient status post ORIF left hip.  EXAM: DG C-ARM 61-120 MIN; LEFT FEMUR - 2 VIEW  FLUOROSCOPY TIME:  30 seconds  COMPARISON:  Hip radiograph 11/14/2014  FINDINGS: Patient status post ORIF left intertrochanteric proximal femur fracture. Hardware is visualized on multiple fluoroscopic images.  IMPRESSION: Status post ORIF left intertrochanteric proximal femur fracture.   Electronically Signed   By: Lovey Newcomer M.D.   On: 11/15/2014 11:01    Microbiology: Recent Results (from the past 240 hour(s))  Urine culture     Status: None   Collection Time: 12/04/14  3:32 PM  Result Value Ref Range Status   Specimen Description URINE, CLEAN CATCH  Final   Special Requests NONE  Final   Colony Count   Final    >=100,000 COLONIES/ML Performed at [facility]    Culture   Final    PSEUDOMONAS AERUGINOSA Performed at [facility]    Report Status 12/08/2014 FINAL  Final   Organism ID, Bacteria PSEUDOMONAS AERUGINOSA  Final      Susceptibility   Pseudomonas aeruginosa - MIC*    CEFEPIME <=1 SENSITIVE Sensitive     CEFTAZIDIME 2 SENSITIVE Sensitive     CIPROFLOXACIN <=0.25 SENSITIVE  Sensitive     GENTAMICIN <=1 SENSITIVE Sensitive     IMIPENEM 8 INTERMEDIATE Intermediate     PIP/TAZO 8 SENSITIVE Sensitive     TOBRAMYCIN <=1 SENSITIVE Sensitive     * PSEUDOMONAS AERUGINOSA  MRSA PCR Screening     Status: None   Collection Time: 12/05/14 12:28 AM  Result Value Ref Range Status   MRSA by PCR NEGATIVE NEGATIVE Final    Comment:        The GeneXpert MRSA Assay (FDA approved for NASAL specimens only), is one component of a comprehensive MRSA colonization surveillance program. It is not intended to diagnose MRSA infection nor to guide or monitor treatment for MRSA infections.      Labs: Basic Metabolic Panel:  Recent Labs Lab 12/04/14 1542 12/05/14 0145 12/07/14 0528 12/08/14 0513  NA 139 136 137 137  K 3.2* 3.1* 3.6 3.4*  CL 104 98 99 98  CO2 27 30 29 29   GLUCOSE 111* 144* 150* 149*  BUN 14 13 12 10   CREATININE 0.76 0.65 0.58 0.54  CALCIUM 8.6 8.3* 8.7 8.8   Liver Function Tests:  Recent Labs Lab 12/05/14 0145  AST 18  ALT 21  ALKPHOS 87  BILITOT 0.6  PROT 5.8*  ALBUMIN 2.8*   No results for input(s): LIPASE, AMYLASE in the last 168 hours. No results for input(s): AMMONIA in the last 168 hours. CBC:  Recent Labs Lab 12/04/14 1542 12/05/14 0145 12/05/14 0852 12/05/14 1712 12/06/14 0106 12/06/14 0854  WBC 8.9 12.7* 11.0* 10.1 9.4 7.1  NEUTROABS 5.8  --   --   --   --   --   HGB 9.5* 9.4* 9.7* 10.4* 9.2* 9.7*  HCT 29.7* 28.7* 29.9* 32.4* 28.6* 30.2*  MCV 96.7 95.7 97.1 97.6 96.6 97.4  PLT 293 255 247 293 257 258   Cardiac Enzymes: No results for input(s): CKTOTAL, CKMB, CKMBINDEX, TROPONINI in the last 168 hours. BNP: BNP (last 3 results) No results for input(s): PROBNP in the last 8760 hours. CBG:  Recent Labs Lab 12/07/14 0726 12/07/14 1213 12/07/14 1742 12/07/14 2042 12/08/14 0715  GLUCAP 181* 87 148* 138* 131*       Signed:  FELIZ ORTIZ, Altus Zaino  [facility] 12/08/2014, 8:26 AM

## 2014-12-08 NOTE — Progress Notes (Signed)
Reported called to [facility]. Pt will be returning via EMS. The pt did not have any questions at this time.

## 2014-12-08 NOTE — Progress Notes (Signed)
Clinical Social Work  CSW faxed DC summary to [facility] who is agreeable to accept today. CSW prepared DC packet with FL2, DNR form, DC summary, and hard scripts included. CSW informed patient of DC and she reports she will call family. Patient reports that she use [facility] to get back to [facility] and is aware of no guarantee of payment. RN to call report to SNF. [facility] arranged, request #: [id number].  CSW is signing off but available if needed.  [place], [place] [phone]

## 2015-02-05 ENCOUNTER — Encounter (HOSPITAL_COMMUNITY): Payer: Self-pay | Admitting: Emergency Medicine

## 2015-02-05 ENCOUNTER — Emergency Department (HOSPITAL_COMMUNITY): Payer: Medicare Other

## 2015-02-05 ENCOUNTER — Emergency Department (HOSPITAL_COMMUNITY)
Admission: EM | Admit: 2015-02-05 | Discharge: 2015-02-06 | Disposition: A | Discharge status: Home / Self Care | Payer: Medicare Other | Attending: Emergency Medicine | Admitting: Emergency Medicine

## 2015-02-05 DIAGNOSIS — I10 Essential (primary) hypertension: Secondary | ICD-10-CM | POA: Insufficient documentation

## 2015-02-05 DIAGNOSIS — Z79899 Other long term (current) drug therapy: Secondary | ICD-10-CM | POA: Diagnosis not present

## 2015-02-05 DIAGNOSIS — I471 Supraventricular tachycardia: Secondary | ICD-10-CM | POA: Insufficient documentation

## 2015-02-05 DIAGNOSIS — E780 Pure hypercholesterolemia: Secondary | ICD-10-CM | POA: Insufficient documentation

## 2015-02-05 DIAGNOSIS — E119 Type 2 diabetes mellitus without complications: Secondary | ICD-10-CM | POA: Insufficient documentation

## 2015-02-05 DIAGNOSIS — Z7982 Long term (current) use of aspirin: Secondary | ICD-10-CM | POA: Diagnosis not present

## 2015-02-05 DIAGNOSIS — Z794 Long term (current) use of insulin: Secondary | ICD-10-CM | POA: Diagnosis not present

## 2015-02-05 DIAGNOSIS — I2510 Atherosclerotic heart disease of native coronary artery without angina pectoris: Secondary | ICD-10-CM | POA: Diagnosis not present

## 2015-02-05 DIAGNOSIS — K5900 Constipation, unspecified: Secondary | ICD-10-CM | POA: Diagnosis present

## 2015-02-05 LAB — BASIC METABOLIC PANEL
ANION GAP: 9 (ref 5–15)
BUN: 12 mg/dL (ref 6–23)
CO2: 28 mmol/L (ref 19–32)
Calcium: 9.3 mg/dL (ref 8.4–10.5)
Chloride: 101 mmol/L (ref 96–112)
Creatinine, Ser: 0.62 mg/dL (ref 0.50–1.10)
GFR calc non Af Amer: 89 mL/min — ABNORMAL LOW (ref >90)
Glucose, Bld: 86 mg/dL (ref 70–99)
Potassium: 3.2 mmol/L — ABNORMAL LOW (ref 3.5–5.1)
Sodium: 138 mmol/L (ref 135–145)

## 2015-02-05 LAB — CBC WITH DIFFERENTIAL/PLATELET
Basophils Absolute: 0 10*3/uL (ref 0.0–0.1)
Basophils Relative: 0 % (ref 0–1)
EOS ABS: 0.1 10*3/uL (ref 0.0–0.7)
EOS PCT: 1 % (ref 0–5)
HEMATOCRIT: 39.8 % (ref 36.0–46.0)
Hemoglobin: 12.9 g/dL (ref 12.0–15.0)
LYMPHS ABS: 1.3 10*3/uL (ref 0.7–4.0)
Lymphocytes Relative: 12 % (ref 12–46)
MCH: 30.6 pg (ref 26.0–34.0)
MCHC: 32.4 g/dL (ref 30.0–36.0)
MCV: 94.3 fL (ref 78.0–100.0)
MONO ABS: 1 10*3/uL (ref 0.1–1.0)
MONOS PCT: 9 % (ref 3–12)
Neutro Abs: 8.6 10*3/uL — ABNORMAL HIGH (ref 1.7–7.7)
Neutrophils Relative %: 78 % — ABNORMAL HIGH (ref 43–77)
Platelets: 252 10*3/uL (ref 150–400)
RBC: 4.22 MIL/uL (ref 3.87–5.11)
RDW: 14.5 % (ref 11.5–15.5)
WBC: 10.9 10*3/uL — AB (ref 4.0–10.5)

## 2015-02-05 MED ORDER — FENTANYL CITRATE 0.05 MG/ML IJ SOLN
100.0000 ug | Freq: Once | INTRAMUSCULAR | Status: AC
  Administered 2015-02-06: 100 ug via INTRAVENOUS
  Filled 2015-02-05: qty 2

## 2015-02-05 MED ORDER — IOHEXOL 300 MG/ML  SOLN
100.0000 mL | Freq: Once | INTRAMUSCULAR | Status: AC | PRN
  Administered 2015-02-05: 100 mL via INTRAVENOUS

## 2015-02-05 MED ORDER — FENTANYL CITRATE 0.05 MG/ML IJ SOLN
50.0000 ug | Freq: Once | INTRAMUSCULAR | Status: AC
  Administered 2015-02-05: 50 ug via INTRAVENOUS
  Filled 2015-02-05: qty 2

## 2015-02-05 MED ORDER — POLYETHYLENE GLYCOL 3350 17 G PO PACK
17.0000 g | PACK | Freq: Every day | ORAL | Status: DC
  Administered 2015-02-06: 17 g via ORAL
  Filled 2015-02-05 (×2): qty 1

## 2015-02-05 MED ORDER — FLEET ENEMA 7-19 GM/118ML RE ENEM
1.0000 | ENEMA | Freq: Once | RECTAL | Status: AC
  Administered 2015-02-06: 1 via RECTAL
  Filled 2015-02-05: qty 1

## 2015-02-05 MED ORDER — ONDANSETRON HCL 4 MG/2ML IJ SOLN
4.0000 mg | Freq: Once | INTRAMUSCULAR | Status: AC
  Administered 2015-02-05: 4 mg via INTRAVENOUS
  Filled 2015-02-05: qty 2

## 2015-02-05 NOTE — ED Provider Notes (Signed)
CSN: [id number]     Arrival date & time 02/05/15  1858 History   First MD Initiated Contact with Patient 02/05/15 1918     Chief Complaint  Patient presents with  . Constipation  . Tachycardia     (Consider location/radiation/quality/duration/timing/severity/associated sxs/prior Treatment) HPI [name] is a 69 y.o. female with history of diabetes, coronary artery disease comes in for evaluation of constipation for the past 5 days. Patient reports she has tried to suppositories at home which produced "a little bit of stool", but did not provide full relief. She reports abdominal pain, distention and became nauseous prior to arrival in the ED. She reports vomiting once, clear fluids. There are no other aggravating or modifying factors. She denies fevers, chills, urinary symptoms, back pain.  Past Medical History  Diagnosis Date  . Diabetes mellitus   . Hypertension   . Hypercholesteremia   . CAD (coronary artery disease)     a. NSTEMI in setting of UTI and SVT 12/2012 => LHC 01/08/13: Proximal LAD 30%, ostial diagonal 30-40%, proximal RCA 95%, inferior AK, EF 45-50% => PCI: Promus DES x 2 to RCA;  b. Echo 01/05/13: EF 19-62%, grade 1 diastolic dysfunction, MAC   . SVT (supraventricular tachycardia)   . Rectal ulceration     colonoscopy 12/2012   Past Surgical History  Procedure Laterality Date  . Eye surgery      laser , cat bil  . Tubal ligation    . Kyphoplasty  12/27/2012    Procedure: KYPHOPLASTY;  Surgeon: Sinclair Ship, MD;  Location: [facility];  Service: Orthopedics;  Laterality: Bilateral;  T-8 kyphoplasty  . Colonoscopy Left 01/12/2013    Procedure: COLONOSCOPY;  Surgeon: Wonda Horner, MD;  Location: [facility] ENDOSCOPY;  Service: Endoscopy;  Laterality: Left;  . Colonoscopy N/A 01/14/2013    Procedure: COLONOSCOPY;  Surgeon: Wonda Horner, MD;  Location: [facility] ENDOSCOPY;  Service: Endoscopy;  Laterality: N/A;  Rm 2034, attemped Colon on 2-15=inadequate prep  . Coronary  angioplasty  12/2012  . Left heart catheterization with coronary angiogram N/A 01/08/2013    Procedure: LEFT HEART CATHETERIZATION WITH CORONARY ANGIOGRAM;  Surgeon: Thayer Headings, MD;  Location: [facility] CATH LAB;  Service: Cardiovascular;  Laterality: N/A;  . Percutaneous stent intervention N/A 01/15/2013    Procedure: PERCUTANEOUS STENT INTERVENTION;  Surgeon: Peter M Martinique, MD;  Location: [facility] CATH LAB;  Service: Cardiovascular;  Laterality: N/A;  . Orif hip fracture Left 11/15/2014    Procedure: OPEN REDUCTION INTERNAL FIXATION HIP INTERTROCH;  Surgeon: Mcarthur Rossetti, MD;  Location: [facility];  Service: Orthopedics;  Laterality: Left;   Family History  Problem Relation Age of Onset  . Hypertension Brother    History  Substance Use Topics  . Smoking status: Never Smoker   . Smokeless tobacco: Never Used  . Alcohol Use: No   OB History    No data available     Review of Systems A 10 point review of systems was completed and was negative except for pertinent positives and negatives as mentioned in the history of present illness     Allergies  Review of patient's allergies indicates no known allergies.  Home Medications   Prior to Admission medications   Medication Sig Start Date End Date Taking? Authorizing Provider  aspirin EC 81 MG tablet Take 1 tablet (81 mg total) by mouth daily. 04/16/13  Yes Liliane Shi, PA-C  Cholecalciferol (VITAMIN D3) 1000 UNITS CAPS Take 2,000 Units by mouth every  morning.    Yes Historical Provider, MD  furosemide (LASIX) 40 MG tablet Take 40 mg by mouth every morning.    Yes Historical Provider, MD  insulin glargine (LANTUS) 100 UNIT/ML injection Inject 0.05 mLs (5 Units total) into the skin 2 (two) times daily. Patient taking differently: Inject 10 Units into the skin daily.  11/13/13 06/23/15 Yes Christina P Rama, MD  metFORMIN (GLUCOPHAGE) 1000 MG tablet Take 1,000 mg by mouth 2 (two) times daily with a meal.   Yes Historical Provider, MD   Multiple Vitamin (MULITIVITAMIN WITH MINERALS) TABS Take 1 tablet by mouth every morning.    Yes Historical Provider, MD  ondansetron (ZOFRAN ODT) 8 MG disintegrating tablet Take 1 tablet (8 mg total) by mouth every 8 (eight) hours as needed for nausea. 12/10/12  Yes Harden Mo, MD  pioglitazone (ACTOS) 15 MG tablet Take 1 tablet (15 mg total) by mouth daily. 11/18/14  Yes Kelvin Cellar, MD  simvastatin (ZOCOR) 40 MG tablet Take 40 mg by mouth at bedtime.   Yes Historical Provider, MD  ciprofloxacin (CIPRO) 500 MG tablet Take 1 tablet (500 mg total) by mouth 2 (two) times daily. Patient not taking: Reported on 02/05/2015 12/08/14   Charlynne Cousins, MD  clopidogrel (PLAVIX) 75 MG tablet Take 1 tablet (75 mg total) by mouth daily with breakfast. Patient not taking: Reported on 12/04/2014 01/17/13   Hosie Poisson, MD  docusate sodium 100 MG CAPS Take 100 mg by mouth 2 (two) times daily. Patient not taking: Reported on 02/05/2015 11/18/14   Kelvin Cellar, MD  metoprolol (LOPRESSOR) 25 MG tablet Take 3 tablets (75 mg total) by mouth 2 (two) times daily. Patient not taking: Reported on 02/05/2015 11/18/14   Kelvin Cellar, MD  nitroGLYCERIN (NITROSTAT) 0.4 MG SL tablet Place 1 tablet (0.4 mg total) under the tongue every 5 (five) minutes as needed for chest pain. 04/16/13   Liliane Shi, PA-C  oxyCODONE-acetaminophen (ROXICET) 5-325 MG per tablet Take 1-2 tablets by mouth every 4 (four) hours as needed for severe pain. Patient not taking: Reported on 02/05/2015 12/08/14   Charlynne Cousins, MD  polyethylene glycol powder [facility]) powder Take 17 g by mouth 2 (two) times daily. Until daily soft stools  OTC 02/06/15   Comer Locket, PA-C  senna-docusate (SENOKOT-S) 8.6-50 MG per tablet Take 1 tablet by mouth daily. 02/06/15   Aiven Kampe, PA-C   BP 142/79 mmHg  Pulse 99  Temp(Src) 97.9 F (36.6 C) (Oral)  Resp 20  SpO2 99% Physical Exam  Constitutional: She is oriented to  person, place, and time. She appears well-developed and well-nourished.  HENT:  Head: Normocephalic and atraumatic.  Mouth/Throat: Oropharynx is clear and moist.  Eyes: Conjunctivae are normal. Pupils are equal, round, and reactive to light. Right eye exhibits no discharge. Left eye exhibits no discharge. No scleral icterus.  Neck: Neck supple.  Cardiovascular: Normal rate, regular rhythm and normal heart sounds.   Pulmonary/Chest: Effort normal and breath sounds normal. No respiratory distress. She has no wheezes. She has no rales.  Abdominal:  Abdomen with moderate distension and firmness in lower abdomen. Tenderness to palpation in lower abd. No lesions or other gross deformities.  Musculoskeletal: Normal range of motion. She exhibits no tenderness.  Neurological: She is alert and oriented to person, place, and time.  Cranial Nerves II-XII grossly intact  Skin: Skin is warm and dry. No rash noted.  Psychiatric: She has a normal mood and affect.  Nursing note and  vitals reviewed.   ED Course  Procedures (including critical care time) Labs Review Labs Reviewed  BASIC METABOLIC PANEL - Abnormal; Notable for the following:    Potassium 3.2 (*)    GFR calc non Af Amer 89 (*)    All other components within normal limits  CBC WITH DIFFERENTIAL/PLATELET - Abnormal; Notable for the following:    WBC 10.9 (*)    Neutrophils Relative % 78 (*)    Neutro Abs 8.6 (*)    All other components within normal limits    Imaging Review Ct Abdomen Pelvis W Contrast  02/05/2015   CLINICAL DATA:  Nausea, vomiting, and constipation for 5 days. Difficulty urinating starting today.  EXAM: CT ABDOMEN AND PELVIS WITH CONTRAST  TECHNIQUE: Multidetector CT imaging of the abdomen and pelvis was performed using the standard protocol following bolus administration of intravenous contrast.  CONTRAST:  131mL OMNIPAQUE IOHEXOL 300 MG/ML  SOLN  COMPARISON:  None.  FINDINGS: Mild dependent changes in the lung bases.  Coronary artery calcifications.  The liver, spleen, gallbladder, pancreas, adrenal glands, abdominal aorta, inferior vena cava, and retroperitoneal lymph nodes are unremarkable. Focal areas of scarring or lobulation in the kidneys. Bilateral hydronephrosis. Mild bilateral hydroureter. This is likely due to reflux or stasis. The bladder is markedly distended without thickening of the wall. The stomach, small bowel, and colon are not abnormally distended. Diffusely stool-filled colon without wall thickening. No free air or free fluid in the abdomen.  Pelvis: The appendix is normal. Prominent stool in the rectum likely due to impaction or constipation. Uterus and ovaries are not enlarged. No pelvic lymphadenopathy. No destructive bone lesions.  IMPRESSION: Large amount of stool in the rectosigmoid colon consistent with constipation or impaction. Marked distention of the bladder with bilateral hydronephrosis and hydroureter, likely representing reflux or stasis. Bilateral renal parenchymal scarring.   Electronically Signed   By: Lucienne Capers M.D.   On: 02/05/2015 22:52     EKG Interpretation   Date/Time:  Thursday February 05 2015 19:08:43 EST Ventricular Rate:  112 PR Interval:  114 QRS Duration: 83 QT Interval:  322 QTC Calculation: 439 R Axis:   19 Text Interpretation:  Sinus tachycardia Atrial premature complexes  Abnormal R-wave progression, early transition Borderline repolarization  abnormality Sinus tachycardia Artifact Premature atrial complexes Abnormal  ekg Confirmed by Carmin Muskrat  MD [phone]) on 02/05/2015 9:19:15 PM     Meds given in ED:  Medications  ondansetron (ZOFRAN) injection 4 mg (4 mg Intravenous Given 02/05/15 2119)  fentaNYL (SUBLIMAZE) injection 50 mcg (50 mcg Intravenous Given 02/05/15 2133)  iohexol (OMNIPAQUE) 300 MG/ML solution 100 mL (100 mLs Intravenous Contrast Given 02/05/15 2218)  sodium phosphate (FLEET) 7-19 GM/118ML enema 1 enema (1 enema Rectal Given 02/06/15  0056)  fentaNYL (SUBLIMAZE) injection 100 mcg (100 mcg Intravenous Given 02/06/15 0056)    Discharge Medication List as of 02/06/2015 12:40 AM    START taking these medications   Details  polyethylene glycol powder (GLYCOLAX/MIRALAX) powder Take 17 g by mouth 2 (two) times daily. Until daily soft stools  OTC, Starting 02/06/2015, Until Discontinued, Print    senna-docusate (SENOKOT-S) 8.6-50 MG per tablet Take 1 tablet by mouth daily., Starting 02/06/2015, Until Discontinued, Print       Filed Vitals:   02/05/15 2154 02/05/15 2239 02/06/15 0001 02/06/15 0053  BP: 148/76 177/96 114/83 142/79  Pulse: 94 140 114 99  Temp:    97.9 F (36.6 C)  TempSrc:    Oral  Resp:  19 20 16 20   SpO2: 99% 100% 99% 99%    MDM  Vitals stable  -afebrile. Heart rate has been stable with no tachycardia. Every time patient moves there is artifact on the screen that was interpreted as sinus tachycardia, but this is an erroneous reading. Pt resting comfortably in ED. Tolerating PO now with now emesis. PE--Pt still mildly distended, but improved from prior abd exam. Labwork--noncontributory Imaging--CT abd shows significant stool in the rectosigmoid colon consistent with constipation or impaction. Distended bladder with bil hydronephrosis and hydroureter.   Pt given dose of miralax and enema in ED with some success and release of stool. Pt also urinating on her own now. No SBO.  Will DC with polyethylene glycol, Senna and instructions for bland diet. I discussed all relevant lab findings and imaging results with pt and they verbalized understanding. Discussed f/u with PCP within 48 hrs and return precautions, pt very amenable to plan.  Prior to patient discharge, I discussed and reviewed this case with Dr.Lockwood    Final diagnoses:  Constipation, unspecified constipation type       Comer Locket, PA-C 02/07/15 1928  Carmin Muskrat, MD 02/09/15 [phone]

## 2015-02-05 NOTE — ED Notes (Signed)
Per EMS. Pt from home. Reports constipation for 5 days. Tried 2 suppositories this am with no success. Upon arrival pt began to throw up for the first time and became tachycardic.

## 2015-02-06 MED ORDER — SENNOSIDES-DOCUSATE SODIUM 8.6-50 MG PO TABS: 1.0000 | ORAL_TABLET | Freq: Every day | ORAL | Status: DC

## 2015-02-06 MED ORDER — POLYETHYLENE GLYCOL 3350 17 GM/SCOOP PO POWD: 17.0000 g | Freq: Two times a day (BID) | ORAL | Status: DC

## 2015-02-06 NOTE — ED Notes (Signed)
[facility] contacted for pt transport.

## 2015-02-06 NOTE — Discharge Instructions (Signed)
Constipation Constipation is when a person:  Poops (has a bowel movement) less than 3 times a week.  Has a hard time pooping.  Has poop that is dry, hard, or bigger than normal. HOME CARE   Eat foods with a lot of fiber in them. This includes fruits, vegetables, beans, and whole grains such as brown rice.  Avoid fatty foods and foods with a lot of sugar. This includes french fries, hamburgers, cookies, candy, and soda.  If you are not getting enough fiber from food, take products with added fiber in them (supplements).  Drink enough fluid to keep your pee (urine) clear or pale yellow.  Exercise on a regular basis, or as told by your doctor.  Go to the restroom when you feel like you need to poop. Do not hold it.  Only take medicine as told by your doctor. Do not take medicines that help you poop (laxatives) without talking to your doctor first. GET HELP RIGHT AWAY IF:   You have bright red blood in your poop (stool).  Your constipation lasts more than 4 days or gets worse.  You have belly (abdominal) or butt (rectal) pain.  You have thin poop (as thin as a pencil).  You lose weight, and it cannot be explained. MAKE SURE YOU:   Understand these instructions.  Will watch your condition.  Will get help right away if you are not doing well or get worse. Document Released: 05/02/2008 Document Revised: 11/19/2013 Document Reviewed: 08/26/2013 [facility] Patient Information 2015 [place], [place]. This information is not intended to replace advice given to you by your health care provider. Make sure you discuss any questions you have with your health care provider.  You were evaluated in the ED today for your constipation. You received a enema in the ED which helped relieve some of your stool burden. You will be discharged with MiraLAX and senna for further assistance with or constipation. It is important if you to follow-up with your primary care for further evaluation and  management of your symptoms. Please return to ED for new or worsening symptoms.

## 2015-02-06 NOTE — ED Notes (Signed)
[facility] transported patient back home but would not take her walker, I will take her walker to [place] or to patients house in the am.

## 2015-06-22 ENCOUNTER — Encounter (HOSPITAL_COMMUNITY): Payer: Self-pay | Admitting: Emergency Medicine

## 2015-06-22 ENCOUNTER — Ambulatory Visit: Payer: Medicare Other | Admitting: Physician Assistant

## 2015-06-22 ENCOUNTER — Emergency Department (HOSPITAL_COMMUNITY)
Admission: EM | Admit: 2015-06-22 | Discharge: 2015-06-22 | Disposition: A | Discharge status: Home / Self Care | Payer: Medicare Other | Attending: Emergency Medicine | Admitting: Emergency Medicine

## 2015-06-22 ENCOUNTER — Emergency Department (HOSPITAL_COMMUNITY): Payer: Medicare Other

## 2015-06-22 DIAGNOSIS — M545 Low back pain, unspecified: Secondary | ICD-10-CM

## 2015-06-22 DIAGNOSIS — E119 Type 2 diabetes mellitus without complications: Secondary | ICD-10-CM | POA: Diagnosis not present

## 2015-06-22 DIAGNOSIS — E782 Mixed hyperlipidemia: Secondary | ICD-10-CM | POA: Insufficient documentation

## 2015-06-22 DIAGNOSIS — M8448XA Pathological fracture, other site, initial encounter for fracture: Secondary | ICD-10-CM | POA: Insufficient documentation

## 2015-06-22 DIAGNOSIS — Z794 Long term (current) use of insulin: Secondary | ICD-10-CM | POA: Diagnosis not present

## 2015-06-22 DIAGNOSIS — Z7982 Long term (current) use of aspirin: Secondary | ICD-10-CM | POA: Insufficient documentation

## 2015-06-22 DIAGNOSIS — I10 Essential (primary) hypertension: Secondary | ICD-10-CM | POA: Insufficient documentation

## 2015-06-22 DIAGNOSIS — I2510 Atherosclerotic heart disease of native coronary artery without angina pectoris: Secondary | ICD-10-CM | POA: Diagnosis not present

## 2015-06-22 DIAGNOSIS — Z79899 Other long term (current) drug therapy: Secondary | ICD-10-CM | POA: Insufficient documentation

## 2015-06-22 DIAGNOSIS — M4854XA Collapsed vertebra, not elsewhere classified, thoracic region, initial encounter for fracture: Secondary | ICD-10-CM

## 2015-06-22 LAB — URINALYSIS, ROUTINE W REFLEX MICROSCOPIC
BILIRUBIN URINE: NEGATIVE
GLUCOSE, UA: NEGATIVE mg/dL
Hgb urine dipstick: NEGATIVE
KETONES UR: 15 mg/dL — AB
NITRITE: NEGATIVE
PROTEIN: NEGATIVE mg/dL
Specific Gravity, Urine: 1.01 (ref 1.005–1.030)
Urobilinogen, UA: 0.2 mg/dL (ref 0.0–1.0)
pH: 6.5 (ref 5.0–8.0)

## 2015-06-22 LAB — URINE MICROSCOPIC-ADD ON

## 2015-06-22 MED ORDER — OXYCODONE-ACETAMINOPHEN 5-325 MG PO TABS: 1.0000 | ORAL_TABLET | Freq: Four times a day (QID) | ORAL | Status: DC | PRN

## 2015-06-22 MED ORDER — OXYCODONE-ACETAMINOPHEN 5-325 MG PO TABS
1.0000 | ORAL_TABLET | Freq: Once | ORAL | Status: AC
  Administered 2015-06-22: 1 via ORAL
  Filled 2015-06-22: qty 1

## 2015-06-22 MED ORDER — DOCUSATE SODIUM 100 MG PO CAPS: 100.0000 mg | ORAL_CAPSULE | Freq: Every day | ORAL | Status: DC | PRN

## 2015-06-22 NOTE — ED Notes (Signed)
Pt transporting to xray.  

## 2015-06-22 NOTE — ED Provider Notes (Signed)
CSN: [id number]     Arrival date & time 06/22/15  1314 History   First MD Initiated Contact with Patient 06/22/15 1740     Chief Complaint  Patient presents with  . Back Pain     (Consider location/radiation/quality/duration/timing/severity/associated sxs/prior Treatment) Patient is a 69 y.o. female presenting with back pain. The history is provided by the patient.  Back Pain Location:  Lumbar spine Quality:  Aching Radiates to:  Does not radiate Pain severity:  Mild Pain is:  Same all the time Onset quality:  Gradual Duration:  3 days Timing:  Constant Progression:  Worsening Chronicity:  New Context comment:  At rest, denies injury Relieved by: advil helped some. Worsened by:  Bending and movement Ineffective treatments:  None tried Associated symptoms: no abdominal pain, no chest pain, no dysuria, no fever and no headaches     Past Medical History  Diagnosis Date  . Diabetes mellitus   . Hypertension   . Hypercholesteremia   . CAD (coronary artery disease)     a. NSTEMI in setting of UTI and SVT 12/2012 => LHC 01/08/13: Proximal LAD 30%, ostial diagonal 30-40%, proximal RCA 95%, inferior AK, EF 45-50% => PCI: Promus DES x 2 to RCA;  b. Echo 01/05/13: EF 93-81%, grade 1 diastolic dysfunction, MAC   . SVT (supraventricular tachycardia)   . Rectal ulceration     colonoscopy 12/2012   Past Surgical History  Procedure Laterality Date  . Eye surgery      laser , cat bil  . Tubal ligation    . Kyphoplasty  12/27/2012    Procedure: KYPHOPLASTY;  Surgeon: Sinclair Ship, MD;  Location: [place];  Service: Orthopedics;  Laterality: Bilateral;  T-8 kyphoplasty  . Colonoscopy Left 01/12/2013    Procedure: COLONOSCOPY;  Surgeon: Wonda Horner, MD;  Location: [facility] ENDOSCOPY;  Service: Endoscopy;  Laterality: Left;  . Colonoscopy N/A 01/14/2013    Procedure: COLONOSCOPY;  Surgeon: Wonda Horner, MD;  Location: [facility] ENDOSCOPY;  Service: Endoscopy;  Laterality: N/A;  Rm 2034, attemped  Colon on 2-15=inadequate prep  . Coronary angioplasty  12/2012  . Left heart catheterization with coronary angiogram N/A 01/08/2013    Procedure: LEFT HEART CATHETERIZATION WITH CORONARY ANGIOGRAM;  Surgeon: Thayer Headings, MD;  Location: [facility] CATH LAB;  Service: Cardiovascular;  Laterality: N/A;  . Percutaneous stent intervention N/A 01/15/2013    Procedure: PERCUTANEOUS STENT INTERVENTION;  Surgeon: Peter M Martinique, MD;  Location: [facility] CATH LAB;  Service: Cardiovascular;  Laterality: N/A;  . Orif hip fracture Left 11/15/2014    Procedure: OPEN REDUCTION INTERNAL FIXATION HIP INTERTROCH;  Surgeon: Mcarthur Rossetti, MD;  Location: [place];  Service: Orthopedics;  Laterality: Left;   Family History  Problem Relation Age of Onset  . Hypertension Brother    History  Substance Use Topics  . Smoking status: Never Smoker   . Smokeless tobacco: Never Used  . Alcohol Use: No   OB History    No data available     Review of Systems  Constitutional: Negative for fever and fatigue.  HENT: Negative for congestion and drooling.   Eyes: Negative for pain.  Respiratory: Negative for cough and shortness of breath.   Cardiovascular: Negative for chest pain.  Gastrointestinal: Negative for nausea, vomiting, abdominal pain and diarrhea.  Genitourinary: Negative for dysuria and hematuria.  Musculoskeletal: Positive for back pain. Negative for gait problem and neck pain.  Skin: Negative for color change.  Neurological: Negative for dizziness and headaches.  Hematological: Negative for adenopathy.  Psychiatric/Behavioral: Negative for behavioral problems.  All other systems reviewed and are negative.     Allergies  Review of patient's allergies indicates no known allergies.  Home Medications   Prior to Admission medications   Medication Sig Start Date End Date Taking? Authorizing Provider  aspirin EC 81 MG tablet Take 1 tablet (81 mg total) by mouth daily. 04/16/13  Yes Liliane Shi, PA-C   Cholecalciferol (VITAMIN D3) 1000 UNITS CAPS Take 2,000 Units by mouth every morning.    Yes Historical Provider, MD  docusate sodium (COLACE) 100 MG capsule Take 100 mg by mouth as needed for mild constipation.   Yes Historical Provider, MD  furosemide (LASIX) 40 MG tablet Take 40 mg by mouth every morning.    Yes Historical Provider, MD  insulin glargine (LANTUS) 100 UNIT/ML injection Inject 0.05 mLs (5 Units total) into the skin 2 (two) times daily. Patient taking differently: Inject 10 Units into the skin daily.  11/13/13 06/23/15 Yes Christina P Rama, MD  metFORMIN (GLUCOPHAGE) 1000 MG tablet Take 1,000 mg by mouth 2 (two) times daily with a meal.   Yes Historical Provider, MD  Multiple Vitamin (MULITIVITAMIN WITH MINERALS) TABS Take 1 tablet by mouth every morning.    Yes Historical Provider, MD  naproxen sodium (ANAPROX) 220 MG tablet Take 440 mg by mouth daily as needed (for pain).   Yes Historical Provider, MD  nitroGLYCERIN (NITROSTAT) 0.4 MG SL tablet Place 1 tablet (0.4 mg total) under the tongue every 5 (five) minutes as needed for chest pain. 04/16/13  Yes Scott T Kathlen Mody, PA-C  pioglitazone (ACTOS) 15 MG tablet Take 1 tablet (15 mg total) by mouth daily. 11/18/14  Yes Kelvin Cellar, MD  polyethylene glycol powder (GLYCOLAX/MIRALAX) powder Take 17 g by mouth 2 (two) times daily. Until daily soft stools  OTC 02/06/15  Yes Comer Locket, PA-C  ramipril (ALTACE) 10 MG capsule Take 10 mg by mouth at bedtime. 06/08/15  Yes Historical Provider, MD  simvastatin (ZOCOR) 40 MG tablet Take 40 mg by mouth at bedtime.   Yes Historical Provider, MD  ciprofloxacin (CIPRO) 500 MG tablet Take 1 tablet (500 mg total) by mouth 2 (two) times daily. Patient not taking: Reported on 02/05/2015 12/08/14   Charlynne Cousins, MD  clopidogrel (PLAVIX) 75 MG tablet Take 1 tablet (75 mg total) by mouth daily with breakfast. Patient not taking: Reported on 12/04/2014 01/17/13   Hosie Poisson, MD  docusate sodium  100 MG CAPS Take 100 mg by mouth 2 (two) times daily. Patient not taking: Reported on 02/05/2015 11/18/14   Kelvin Cellar, MD  metoprolol (LOPRESSOR) 25 MG tablet Take 3 tablets (75 mg total) by mouth 2 (two) times daily. Patient not taking: Reported on 02/05/2015 11/18/14   Kelvin Cellar, MD  ondansetron (ZOFRAN ODT) 8 MG disintegrating tablet Take 1 tablet (8 mg total) by mouth every 8 (eight) hours as needed for nausea. 12/10/12   Harden Mo, MD  oxyCODONE-acetaminophen (ROXICET) 5-325 MG per tablet Take 1-2 tablets by mouth every 4 (four) hours as needed for severe pain. Patient not taking: Reported on 02/05/2015 12/08/14   Charlynne Cousins, MD  senna-docusate (SENOKOT-S) 8.6-50 MG per tablet Take 1 tablet by mouth daily. 02/06/15   Benjamin Cartner, PA-C   BP 159/81 mmHg  Pulse 99  Temp(Src) 98.5 F (36.9 C) (Oral)  Resp 18  SpO2 100% Physical Exam  Constitutional: She is oriented to person, place, and time. She appears  well-developed and well-nourished.  HENT:  Head: Normocephalic.  Mouth/Throat: Oropharynx is clear and moist. No oropharyngeal exudate.  Eyes: Conjunctivae and EOM are normal. Pupils are equal, round, and reactive to light.  Neck: Normal range of motion. Neck supple.  Cardiovascular: Normal rate, regular rhythm, normal heart sounds and intact distal pulses.  Exam reveals no gallop and no friction rub.   No murmur heard. Pulmonary/Chest: Effort normal and breath sounds normal. No respiratory distress. She has no wheezes.  Abdominal: Soft. Bowel sounds are normal. There is no tenderness. There is no rebound and no guarding.  Musculoskeletal: Normal range of motion. She exhibits tenderness. She exhibits no edema.  Mild right lower paralumbar tenderness. No vertebral ttp.   Normal strength and sensation in bilateral lower extremities.  2+ distal DP pulses.  Pain reproduced with rotation of the torso and  straight leg raise in the right lower extremity.    Neurological: She is alert and oriented to person, place, and time.  Skin: Skin is warm and dry.  Psychiatric: She has a normal mood and affect. Her behavior is normal.  Nursing note and vitals reviewed.   ED Course  Procedures (including critical care time) Labs Review Labs Reviewed - No data to display  Imaging Review Dg Thoracic Spine 2 View  06/22/2015   CLINICAL DATA:  Back pain, onset 4 days ago.  EXAM: THORACIC SPINE - 2-3 VIEWS  COMPARISON:  MRI 12/12/2012  FINDINGS: There is T8 vertebroplasty, with no progressive loss of height since 12/12/2012. There is moderate anterior wedging of T7 which is new from 12/12/2012 but not necessarily acute. There is minimal anterior wedging of T11, also new from 12/12/2012 but not necessarily acute. No bone lesion or bony destruction is evident. There is moderate kyphosis centered at T8.  IMPRESSION: Kyphosis with multiple vertebral compressions, age indeterminate. The greatest interval change from 12/12/2012 is at T7 where there is greater than 50% loss of height anteriorly.   Electronically Signed   By: Andreas Newport M.D.   On: 06/22/2015 20:57   Dg Lumbar Spine Complete  06/22/2015   CLINICAL DATA:  Low back pain  EXAM: LUMBAR SPINE - COMPLETE 4+ VIEW  COMPARISON:  02/05/2015  FINDINGS: Osteopenia. No lumbar vertebral compression deformity. There is an apparent wedge deformity at T11. This may simply represent a projectional phenomenon. Disc height is relatively maintained.  IMPRESSION: Possible T11 compression deformity. Thoracic spine imaging can be performed to further delineate.  No acute bony pathology in the lumbar spine.   Electronically Signed   By: Marybelle Killings M.D.   On: 06/22/2015 19:16     EKG Interpretation None      MDM   Final diagnoses:  Right-sided low back pain without sciatica  Compression fracture of thoracic spine, non-traumatic, initial encounter    6:19 PM 69 y.o. female w hx of DM, HTN, CAD on plavix, s/p T8  kyphoplasty in 2014 who presents with low back pain which began 3 days ago. She notes it was gradual in onset and has slowly worsened. She describes a stiffness which is worse with rotating her torso or lifting her right lower extremity. She denies any radiation of the pain. She denies any urinary symptoms or fever. She normally and relates with a walker due to previous hip replacement. Vital signs unremarkable here and she is well-appearing on exam. Pain does not seem to be vertebral but right-sided and paralumbar. I suspected to be musculoskeletal. She has had a CT scan earlier  this year that showed no evidence of AAA. We'll get screening urinalysis, plain film, and Percocet for pain.  Pt denies weakness, numbness, fevers. Does have DM, is on plavix. No other bp redflags noted.   9:27 PM: Urinalysis noncontributory. Age indeterminant compression fractures noted on thoracic spine imaging. Patient is ambulatory at baseline with a walker here. Pain medicine for home and follow-up with her orthopedist. I have discussed the diagnosis/risks/treatment options with the patient and believe the pt to be eligible for discharge home to follow-up with her orthopod. We also discussed returning to the ED immediately if new or worsening sx occur. We discussed the sx which are most concerning (e.g., worsening pain, weakness, numbness, fever) that necessitate immediate return. Medications administered to the patient during their visit and any new prescriptions provided to the patient are listed below.  Medications given during this visit Medications  oxyCODONE-acetaminophen (PERCOCET/ROXICET) 5-325 MG per tablet 1 tablet (1 tablet Oral Given 06/22/15 1914)    New Prescriptions   DOCUSATE SODIUM (COLACE) 100 MG CAPSULE    Take 1 capsule (100 mg total) by mouth daily as needed for mild constipation.   OXYCODONE-ACETAMINOPHEN (PERCOCET) 5-325 MG PER TABLET    Take 1 tablet by mouth every 6 (six) hours as needed for  moderate pain.     Pamella Pert, MD 06/22/15 2128

## 2015-06-22 NOTE — ED Notes (Signed)
Pt. Stated, I started having back pain on Thursday and its getting worse.

## 2015-06-28 ENCOUNTER — Encounter (HOSPITAL_COMMUNITY): Payer: Self-pay

## 2015-06-28 ENCOUNTER — Emergency Department (HOSPITAL_COMMUNITY): Payer: Medicare Other

## 2015-06-28 ENCOUNTER — Emergency Department (HOSPITAL_COMMUNITY)
Admission: EM | Admit: 2015-06-28 | Discharge: 2015-06-28 | Disposition: A | Discharge status: Home / Self Care | Payer: Medicare Other | Attending: Emergency Medicine | Admitting: Emergency Medicine

## 2015-06-28 DIAGNOSIS — R630 Anorexia: Secondary | ICD-10-CM | POA: Diagnosis not present

## 2015-06-28 DIAGNOSIS — Z8719 Personal history of other diseases of the digestive system: Secondary | ICD-10-CM | POA: Diagnosis not present

## 2015-06-28 DIAGNOSIS — R1084 Generalized abdominal pain: Secondary | ICD-10-CM | POA: Insufficient documentation

## 2015-06-28 DIAGNOSIS — R197 Diarrhea, unspecified: Secondary | ICD-10-CM | POA: Insufficient documentation

## 2015-06-28 DIAGNOSIS — E119 Type 2 diabetes mellitus without complications: Secondary | ICD-10-CM | POA: Insufficient documentation

## 2015-06-28 DIAGNOSIS — R1110 Vomiting, unspecified: Secondary | ICD-10-CM

## 2015-06-28 DIAGNOSIS — E780 Pure hypercholesterolemia: Secondary | ICD-10-CM | POA: Diagnosis not present

## 2015-06-28 DIAGNOSIS — R112 Nausea with vomiting, unspecified: Secondary | ICD-10-CM | POA: Insufficient documentation

## 2015-06-28 DIAGNOSIS — Z79899 Other long term (current) drug therapy: Secondary | ICD-10-CM | POA: Diagnosis not present

## 2015-06-28 DIAGNOSIS — G8929 Other chronic pain: Secondary | ICD-10-CM | POA: Insufficient documentation

## 2015-06-28 DIAGNOSIS — M545 Low back pain: Secondary | ICD-10-CM | POA: Insufficient documentation

## 2015-06-28 DIAGNOSIS — R5383 Other fatigue: Secondary | ICD-10-CM | POA: Diagnosis not present

## 2015-06-28 DIAGNOSIS — I10 Essential (primary) hypertension: Secondary | ICD-10-CM | POA: Insufficient documentation

## 2015-06-28 DIAGNOSIS — Z7982 Long term (current) use of aspirin: Secondary | ICD-10-CM | POA: Insufficient documentation

## 2015-06-28 DIAGNOSIS — I2510 Atherosclerotic heart disease of native coronary artery without angina pectoris: Secondary | ICD-10-CM | POA: Diagnosis not present

## 2015-06-28 LAB — COMPREHENSIVE METABOLIC PANEL
ALT: 12 U/L — AB (ref 14–54)
AST: 13 U/L — AB (ref 15–41)
Albumin: 3.4 g/dL — ABNORMAL LOW (ref 3.5–5.0)
Alkaline Phosphatase: 73 U/L (ref 38–126)
Anion gap: 12 (ref 5–15)
BUN: 20 mg/dL (ref 6–20)
CO2: 25 mmol/L (ref 22–32)
Calcium: 9.1 mg/dL (ref 8.9–10.3)
Chloride: 103 mmol/L (ref 101–111)
Creatinine, Ser: 0.68 mg/dL (ref 0.44–1.00)
GFR calc Af Amer: >60 mL/min (ref >60)
GFR calc non Af Amer: >60 mL/min (ref >60)
Glucose, Bld: 104 mg/dL — ABNORMAL HIGH (ref 65–99)
Potassium: 3.5 mmol/L (ref 3.5–5.1)
SODIUM: 140 mmol/L (ref 135–145)
TOTAL PROTEIN: 7.1 g/dL (ref 6.5–8.1)
Total Bilirubin: 0.8 mg/dL (ref 0.3–1.2)

## 2015-06-28 LAB — URINE MICROSCOPIC-ADD ON

## 2015-06-28 LAB — CBC
HEMATOCRIT: 40.8 % (ref 36.0–46.0)
Hemoglobin: 13.4 g/dL (ref 12.0–15.0)
MCH: 30.2 pg (ref 26.0–34.0)
MCHC: 32.8 g/dL (ref 30.0–36.0)
MCV: 92.1 fL (ref 78.0–100.0)
Platelets: 216 10*3/uL (ref 150–400)
RBC: 4.43 MIL/uL (ref 3.87–5.11)
RDW: 13 % (ref 11.5–15.5)
WBC: 7.6 10*3/uL (ref 4.0–10.5)

## 2015-06-28 LAB — URINALYSIS, ROUTINE W REFLEX MICROSCOPIC
Bilirubin Urine: NEGATIVE
GLUCOSE, UA: NEGATIVE mg/dL
KETONES UR: 40 mg/dL — AB
Nitrite: NEGATIVE
PH: 6 (ref 5.0–8.0)
Protein, ur: NEGATIVE mg/dL
Specific Gravity, Urine: 1.044 — ABNORMAL HIGH (ref 1.005–1.030)
UROBILINOGEN UA: 1 mg/dL (ref 0.0–1.0)

## 2015-06-28 LAB — I-STAT TROPONIN, ED: TROPONIN I, POC: 0.01 ng/mL (ref 0.00–0.08)

## 2015-06-28 LAB — LIPASE, BLOOD: LIPASE: 19 U/L — AB (ref 22–51)

## 2015-06-28 MED ORDER — ONDANSETRON 4 MG PO TBDP: 4.0000 mg | ORAL_TABLET | Freq: Four times a day (QID) | ORAL | Status: DC | PRN

## 2015-06-28 MED ORDER — IOHEXOL 300 MG/ML  SOLN: 100.0000 mL | Freq: Once | INTRAMUSCULAR | Status: DC | PRN

## 2015-06-28 MED ORDER — ONDANSETRON 4 MG PO TBDP: 4.0000 mg | ORAL_TABLET | Freq: Three times a day (TID) | ORAL | Status: DC | PRN

## 2015-06-28 MED ORDER — SODIUM CHLORIDE 0.9 % IV BOLUS (SEPSIS)
1000.0000 mL | Freq: Once | INTRAVENOUS | Status: AC
  Administered 2015-06-28: 1000 mL via INTRAVENOUS

## 2015-06-28 MED ORDER — ONDANSETRON HCL 4 MG/2ML IJ SOLN
4.0000 mg | Freq: Once | INTRAMUSCULAR | Status: AC
  Administered 2015-06-28: 4 mg via INTRAVENOUS
  Filled 2015-06-28: qty 2

## 2015-06-28 NOTE — ED Provider Notes (Signed)
CSN: [id number]     Arrival date & time 06/28/15  1101 History   First MD Initiated Contact with Patient 06/28/15 1102     Chief Complaint  Patient presents with  . Back Pain  . Fatigue     (Consider location/radiation/quality/duration/timing/severity/associated sxs/prior Treatment) HPI [name] is a 69 y.o. female with past medical history of DM type II, hypertension, hyperlipidemia, chronic low back pain who presents to the emergency department today with complaint of 3 days of n/v/d along with decreased appetite and low back pain that is chronic. She is scheduled to have surgery for this during the coming week.  She was transported via EMS, given 72m zofran and 200cc's of fluid.  Reports that she is having emesis 6-8 times daily and denies any hematemesis.  Also reports having diarrhea described as loose and watery, without blood in her stool.  She cannot endorse anything that makes her symptoms worse other than ambulating and Percocet.  The percocet was given to her at her last ED visit on 7/25 for back pain. She also reports being unable to keep adequate solid foods down during this time.  Patient reports that the zofran did help her nausea.  She took her insulin 10units this morning and CBG at arrival was 106.  Past Medical History  Diagnosis Date  . Diabetes mellitus   . Hypertension   . Hypercholesteremia   . CAD (coronary artery disease)     a. NSTEMI in setting of UTI and SVT 12/2012 => LHC 01/08/13: Proximal LAD 30%, ostial diagonal 30-40%, proximal RCA 95%, inferior AK, EF 45-50% => PCI: Promus DES x 2 to RCA;  b. Echo 01/05/13: EF 523-30% grade 1 diastolic dysfunction, MAC   . SVT (supraventricular tachycardia)   . Rectal ulceration     colonoscopy 12/2012   Past Surgical History  Procedure Laterality Date  . Eye surgery      laser , cat bil  . Tubal ligation    . Kyphoplasty  12/27/2012    Procedure: KYPHOPLASTY;  Surgeon: MSinclair Ship MD;  [place]  Service: [place]opedics;  Laterality: Bilateral;  T-8 kyphoplasty  . Colonoscopy Left 01/12/2013    Procedure: COLONOSCOPY;  Surgeon: SWonda Horner MD;  [facility]  Service: Endoscopy;  Laterality: Left;  . Colonoscopy N/A 01/14/2013    Procedure: COLONOSCOPY;  Surgeon: SWonda Horner MD;  [facility]  Service: Endoscopy;  Laterality: N/A;  Rm 2034, attemped Colon on 2-15=inadequate prep  . Coronary angioplasty  12/2012  . Left heart catheterization with coronary angiogram N/A 01/08/2013    Procedure: LEFT HEART CATHETERIZATION WITH CORONARY ANGIOGRAM;  Surgeon: PThayer Headings MD;  [facility];  Service: Cardiovascular;  Laterality: N/A;  . Percutaneous stent intervention N/A 01/15/2013    Procedure: PERCUTANEOUS STENT INTERVENTION;  Surgeon: Peter M JMartinique MD;  [facility];  Service: Cardiovascular;  Laterality: N/A;  . Orif hip fracture Left 11/15/2014    Procedure: OPEN REDUCTION INTERNAL FIXATION HIP INTERTROCH;  Surgeon: CMcarthur Rossetti MD;  [place]  Service: Orthopedics;  Laterality: Left;   Family History  Problem Relation Age of Onset  . Hypertension Brother    History  Substance Use Topics  . Smoking status: Never Smoker   . Smokeless tobacco: Never Used  . Alcohol Use: No   OB History    No data available     Review of Systems  Constitutional: Positive for appetite  change and fatigue. Negative for fever and chills.  Respiratory: Negative for cough and shortness of breath.   Cardiovascular: Negative for chest pain.  Gastrointestinal: Positive for nausea, vomiting and diarrhea. Negative for abdominal pain and constipation.  Genitourinary: Negative for dysuria and frequency.  Musculoskeletal: Positive for back pain.  Skin: Negative for color change and pallor.  Neurological: Negative for syncope and weakness.  All other systems reviewed and are negative.     Allergies  Review of patient's allergies  indicates no known allergies.  Home Medications   Prior to Admission medications   Medication Sig Start Date End Date Taking? Authorizing Provider  aspirin EC 81 MG tablet Take 1 tablet (81 mg total) by mouth daily. 04/16/13   Liliane Shi, PA-C  Cholecalciferol (VITAMIN D3) 1000 UNITS CAPS Take 2,000 Units by mouth every morning.     Historical Provider, MD  ciprofloxacin (CIPRO) 500 MG tablet Take 1 tablet (500 mg total) by mouth 2 (two) times daily. Patient not taking: Reported on 02/05/2015 12/08/14   Charlynne Cousins, MD  clopidogrel (PLAVIX) 75 MG tablet Take 1 tablet (75 mg total) by mouth daily with breakfast. Patient not taking: Reported on 12/04/2014 01/17/13   Hosie Poisson, MD  docusate sodium (COLACE) 100 MG capsule Take 100 mg by mouth as needed for mild constipation.    Historical Provider, MD  docusate sodium (COLACE) 100 MG capsule Take 1 capsule (100 mg total) by mouth daily as needed for mild constipation. 06/22/15   Pamella Pert, MD  docusate sodium 100 MG CAPS Take 100 mg by mouth 2 (two) times daily. Patient not taking: Reported on 02/05/2015 11/18/14   Kelvin Cellar, MD  furosemide (LASIX) 40 MG tablet Take 40 mg by mouth every morning.     Historical Provider, MD  insulin glargine (LANTUS) 100 UNIT/ML injection Inject 0.05 mLs (5 Units total) into the skin 2 (two) times daily. Patient taking differently: Inject 10 Units into the skin daily.  11/13/13 06/23/15  Venetia Maxon Rama, MD  metFORMIN (GLUCOPHAGE) 1000 MG tablet Take 1,000 mg by mouth 2 (two) times daily with a meal.    Historical Provider, MD  metoprolol (LOPRESSOR) 25 MG tablet Take 3 tablets (75 mg total) by mouth 2 (two) times daily. Patient not taking: Reported on 02/05/2015 11/18/14   Kelvin Cellar, MD  Multiple Vitamin (MULITIVITAMIN WITH MINERALS) TABS Take 1 tablet by mouth every morning.     Historical Provider, MD  naproxen sodium (ANAPROX) 220 MG tablet Take 440 mg by mouth daily as needed (for  pain).    Historical Provider, MD  nitroGLYCERIN (NITROSTAT) 0.4 MG SL tablet Place 1 tablet (0.4 mg total) under the tongue every 5 (five) minutes as needed for chest pain. 04/16/13   Liliane Shi, PA-C  ondansetron (ZOFRAN ODT) 8 MG disintegrating tablet Take 1 tablet (8 mg total) by mouth every 8 (eight) hours as needed for nausea. 12/10/12   Harden Mo, MD  oxyCODONE-acetaminophen (PERCOCET) 5-325 MG per tablet Take 1 tablet by mouth every 6 (six) hours as needed for moderate pain. 06/22/15   Pamella Pert, MD  oxyCODONE-acetaminophen (ROXICET) 5-325 MG per tablet Take 1-2 tablets by mouth every 4 (four) hours as needed for severe pain. Patient not taking: Reported on 02/05/2015 12/08/14   Charlynne Cousins, MD  pioglitazone (ACTOS) 15 MG tablet Take 1 tablet (15 mg total) by mouth daily. 11/18/14   Kelvin Cellar, MD  polyethylene glycol powder (GLYCOLAX/MIRALAX) powder Take 17 g  by mouth 2 (two) times daily. Until daily soft stools  OTC 02/06/15   Comer Locket, PA-C  ramipril (ALTACE) 10 MG capsule Take 10 mg by mouth at bedtime. 06/08/15   Historical Provider, MD  senna-docusate (SENOKOT-S) 8.6-50 MG per tablet Take 1 tablet by mouth daily. 02/06/15   Comer Locket, PA-C  simvastatin (ZOCOR) 40 MG tablet Take 40 mg by mouth at bedtime.    Historical Provider, MD   BP 175/95 mmHg  Pulse 98  Temp(Src) 99.6 F (37.6 C) (Oral)  Resp 21  SpO2 96% Physical Exam  Constitutional: She appears well-developed and well-nourished. No distress.  HENT:  Head: Normocephalic and atraumatic.  Eyes: EOM are normal.  Cardiovascular: Normal rate, regular rhythm and intact distal pulses.   Pulmonary/Chest: Effort normal and breath sounds normal.  Abdominal: Soft. Bowel sounds are normal. She exhibits no distension. There is tenderness.  Diffuse tenderness to palpation lower abdomen.   Neurological: She is alert.  Skin: Skin is warm and intact. She is not diaphoretic. No pallor.   Psychiatric: She has a normal mood and affect. Her speech is normal and behavior is normal.  Nursing note and vitals reviewed.   ED Course  Procedures (including critical care time) Labs Review Labs Reviewed - No data to display  Imaging Review No results found.   EKG Interpretation   Date/Time:  Sunday June 28 2015 11:12:27 EDT Ventricular Rate:  93 PR Interval:  103 QRS Duration: 86 QT Interval:  374 QTC Calculation: 465 R Axis:   -15 Text Interpretation:  Sinus rhythm Short PR interval Probable left  ventricular hypertrophy No significant change since last tracing Confirmed  by Mingo Amber  MD, [place] ([id number]) on 06/28/2015 11:22:29 AM      MDM   Final diagnoses:  None   69 y.o. female with past history of diabetes, HTN, hyperlipidemia, chronic low back pain presenting with 3 day history of nausea, vomiting, diarrhea and fatigue.  Will give 80m zofran for nausea, 1L NS fluid resuscitation, check electrolytes, CMP, lipase, troponin, urinalysis, chest xray.  EKG with no significant change since last EKG.  Patient does endorse some diffuse lower quadrant tenderness. WIll obtain CT abdomen and pelvis with contrast.  1:11pm - initial troponin negative, lipase 19, cbc with wbc of 7.6, H/H 13.4/40.8. CMP unremarkable with normal electrolytes, liver enzymes not elevated, alk phos and bili normal.  No anion gap present. 3:00pm - CT abdomen and pelvis shows colonic stool burden, diverticulosis without evidence of diverticulitis.  Pt has chronic back pain likely correlated with findings on CT that she is scheduled to have surgery for this week.  Urine pending.  If urine clean and with otherwise negative work-up, likely sx related to viral gastroenteritis.  Will recommend supportive care, give rx for zofran and recommend return to ED if symptoms worsen.    AJule Ser DO 06/28/15 1535  BEvelina Bucy MD 06/28/15 [id number]

## 2015-06-28 NOTE — ED Notes (Signed)
Per EMS- pt c/o generalized fatigue, n/v/d, decreased appetite x 3 days. Hx Chronic Back Pain - surgery scheduled for this week, pain 10/10 in back. Took insulin this morning but did not eat. CBG 106. BP 160/100, 110bpm, RR 18, 94% on RA. 22G Left Hand. 4mg  zofran.

## 2015-06-28 NOTE — Discharge Instructions (Signed)

## 2015-06-28 NOTE — ED Notes (Signed)
Pt returned from x-ray. Monitored by pulse ox, bp cuff, and 5-lead.

## 2015-06-28 NOTE — ED Notes (Signed)
Attempted to in-and-out cath pt with no urine return. Pt had just saturated Depends at CT scan. Pt attempting to use bedpan at this moment.

## 2015-06-30 ENCOUNTER — Other Ambulatory Visit: Payer: Self-pay | Admitting: Orthopedic Surgery

## 2015-06-30 ENCOUNTER — Ambulatory Visit
Admission: RE | Admit: 2015-06-30 | Discharge: 2015-06-30 | Disposition: A | Discharge status: Home / Self Care | Payer: Medicare Other | Source: Ambulatory Visit | Attending: Orthopedic Surgery | Admitting: Orthopedic Surgery

## 2015-06-30 DIAGNOSIS — M546 Pain in thoracic spine: Secondary | ICD-10-CM

## 2015-07-01 LAB — URINE CULTURE: Culture: >=100000

## 2015-07-02 ENCOUNTER — Telehealth (HOSPITAL_COMMUNITY): Payer: Self-pay

## 2015-07-02 NOTE — Progress Notes (Signed)
ED Antimicrobial Stewardship Positive Culture Follow Up   [name] is an 69 y.o. female who presented to [facility] on 06/28/2015 with a chief complaint of  Chief Complaint  Patient presents with  . Back Pain  . Fatigue    Recent Results (from the past 720 hour(s))  Urine culture     Status: None   Collection Time: 06/28/15  3:52 PM  Result Value Ref Range Status   Specimen Description URINE, CATHETERIZED  Final   Special Requests ADDED 2150  Final   Culture >=100,000 COLONIES/mL ENTEROCOCCUS SPECIES  Final   Report Status 07/01/2015 FINAL  Final   Organism ID, Bacteria ENTEROCOCCUS SPECIES  Final      Susceptibility   Enterococcus species - MIC*    AMPICILLIN <=2 SENSITIVE Sensitive     LEVOFLOXACIN >=8 RESISTANT Resistant     NITROFURANTOIN <=16 SENSITIVE Sensitive     VANCOMYCIN 1 SENSITIVE Sensitive     LINEZOLID 2 SENSITIVE Sensitive     * >=100,000 COLONIES/mL ENTEROCOCCUS SPECIES    [x]  Patient discharged originally without antimicrobial agent and treatment may now be indicated  Plan: Call patient and ask if she is still having back pain, nausea, and vomiting. Also ask if any urinary symptoms. If so, start amoxicillin 500 mg 1 tablet every 12 hours for 7 days.    ED Provider: Abigail Butts, PA-C   [name] 07/02/2015, 8:30 AM Infectious Diseases Pharmacist Phone# [phone]

## 2015-07-02 NOTE — Telephone Encounter (Signed)
Post ED Visit - Positive Culture Follow-up: Successful Patient Follow-Up  Culture assessed and recommendations reviewed by: []  Heide Guile, Pharm.D., BCPS-AQ ID []  Alycia Rossetti, Pharm.D., BCPS []  [place], Pharm.D., BCPS, AAHIVP []  Legrand Como, Pharm.D., BCPS, AAHIVP []  Tegan Magsam, Pharm.D. []  Milus Glazier, Pharm.D. [facility]  Positive urine culture  []  Patient discharged without antimicrobial prescription and treatment is now indicated []  Organism is resistant to prescribed ED discharge antimicrobial []  Patient with positive blood cultures  Changes discussed with ED provider: Fallon PA New antibiotic prescription if symptoms of UTI call in amoxicillin 500 mg po every 12 hours for 7 days  Spoke with pt. No urinary symptoms. Having back surgery next Wednesday. No medication called in at this time      Ileene Musa 07/02/2015, 9:17 AM

## 2015-07-03 ENCOUNTER — Other Ambulatory Visit: Payer: Self-pay | Admitting: Orthopedic Surgery

## 2015-07-07 ENCOUNTER — Encounter (HOSPITAL_COMMUNITY): Payer: Self-pay | Admitting: Emergency Medicine

## 2015-07-07 ENCOUNTER — Encounter (HOSPITAL_COMMUNITY): Payer: Self-pay | Admitting: *Deleted

## 2015-07-07 NOTE — Progress Notes (Signed)
Ms Fogleman reports that she stopped Plavix along time ago.  Patient is not sure who told her to stop.  I see in EPIC that she received order from Dr Martinique to stop Plavix 07/21/14 and to take Aspirin 81mg ..  Ms Boulay does not check CBGs, last A1C was 5.5  Ms Dumlao jumps around from subject to subject, even as I ask her a specific question.  Patient also is forgetful, has called back since earlier phone call to find out what time to arrive tomorrow.  I have requested office notes, labs, EKG and any cardiac study from Dr Kenton Kingfisher with [facility]. Patient has not seen cardiology since 12/2013.  Has a couple cancelled appointments.  Patient denies chest pain or shortness of breath.

## 2015-07-07 NOTE — Progress Notes (Signed)
Anesthesia Chart Review:  Pt is 69 year old female scheduled for T11 kyphoplasty on 07/08/2015 with Dr. Lynann Bologna.   Pt is same day work up.   PMH includes: CAD (NSTEMI in setting of UTI and SVT 12/2012 => LHC 01/08/13: Proximal LAD 30%, ostial diagonal 30-40%, proximal RCA 95%, inferior AK, EF 45-50% => PCI: Promus DES x 2 to RCA), SVT, HTN, DM, hypercholesterolemia. Never smoker. BMI 29.  S/p ORIF L hip intertrochanteric femur fracture 11/15/14. S/p kyphoplasty 12/27/12.   Medications include: ASA, lasix, lantus, metformin, pioglitazone, ramipril, simvastatin.  Labs from ER visit 06/28/15 reviewed. CMET and CBC acceptable for surgery. PT/PTT will need to be drawn DOS.   1 view CXR 06/28/2015: Mild hyperinflation and cardiomegaly. No active disease.   EKG 06/28/2015: Sinus rhythm. Short PR interval. Probable left ventricular hypertrophy.  PTCA and stenting of RCA 01/15/2013: Successful PCI of the proximal to mid RCA with 2 drug-eluting stents. Procedure was complicated by difficult guide backup, extreme vessel tortuosity, and extensive dissection post PTCA. With stenting we were able to achieve an excellent angiographic result (post-stent stenosis 0%)  Cardiac cath 01/08/2013: 1. Single vessel CAD involving the proximal RCA (95%) 2. Mild LV dysfunction secondary to her recent Inferior wall NSTEMI.  Echo 01/05/2013:  - Left ventricle: The cavity size was normal. Systolic function was normal. The estimated ejection fraction was in the range of 55% to 65%. Although no diagnostic regional wall motion abnormality was identified, this possibility cannot be completely excluded on the basis of this study. Doppler parameters are consistent with abnormal left ventricular relaxation (grade 1 diastolic dysfunction). - Mitral valve: Calcified annulus.  Pt last had outpatient appt with cardiology 12/2013. Last seen by cardiology 10/2014 when inpatient for hip fracture. During that inpatient stay pt was experiencing  SVT. She was placed on diltiazem (which is not currently listed in her active medications) and she was restarted on her plavix that had been discontinued 12/2013; it appears pt took herself off of it in January. Pt also seems to have taken herself off her metoprolol last March. Pt was asked to f/u with cardiology 2 weeks after discharge but has not seen cardiology since inpatient stay (8 months ago).   Reviewed case with Dr. Marcie Bal.   Pt will need cardiac clearance before procedure. Left voicemail for Carla in Dr. Laurena Bering office.   Willeen Cass, FNP-BC [facility]/Anesthesiology Phone: [phone] 07/07/2015 2:21 PM

## 2015-07-08 ENCOUNTER — Telehealth: Payer: Self-pay | Admitting: *Deleted

## 2015-07-08 ENCOUNTER — Ambulatory Visit (HOSPITAL_COMMUNITY): Admission: RE | Admit: 2015-07-08 | Payer: Medicare Other | Source: Ambulatory Visit | Admitting: Orthopedic Surgery

## 2015-07-08 HISTORY — DX: Personal history of other medical treatment: Z92.89

## 2015-07-08 HISTORY — DX: Acute myocardial infarction, unspecified: I21.9

## 2015-07-08 HISTORY — DX: Anxiety disorder, unspecified: F41.9

## 2015-07-08 HISTORY — DX: Unspecified osteoarthritis, unspecified site: M19.90

## 2015-07-08 SURGERY — KYPHOPLASTY
Anesthesia: General

## 2015-07-08 NOTE — Telephone Encounter (Signed)
Patient already has appointment with Dr.Hochrein 07/09/15 at 11:15 am.

## 2015-07-08 NOTE — Telephone Encounter (Signed)
Requesting surgical clearance:   1. Type of surgery: T11 Kyphoplasty  2. Surgeon: Phylliss Bob  3. Surgical date: Pending  4. Medications that need to be help: Aspirin   Can pt hold Aspirin, if so how long?   Dr Martinique did a CATH on 12/2012 and saw patient last on 06/2013, Denton Brick saw her 12/2013  Clearance send to cheryl pugh, LPN and Dr Martinique

## 2015-07-09 ENCOUNTER — Encounter: Payer: Self-pay | Admitting: Cardiology

## 2015-07-09 ENCOUNTER — Ambulatory Visit (INDEPENDENT_AMBULATORY_CARE_PROVIDER_SITE_OTHER): Payer: Medicare Other | Admitting: Cardiology

## 2015-07-09 ENCOUNTER — Telehealth: Payer: Self-pay | Admitting: *Deleted

## 2015-07-09 VITALS — BP 108/76 | HR 122 | Wt 180.0 lb

## 2015-07-09 DIAGNOSIS — I2510 Atherosclerotic heart disease of native coronary artery without angina pectoris: Secondary | ICD-10-CM | POA: Diagnosis not present

## 2015-07-09 DIAGNOSIS — I10 Essential (primary) hypertension: Secondary | ICD-10-CM

## 2015-07-09 DIAGNOSIS — Z01810 Encounter for preprocedural cardiovascular examination: Secondary | ICD-10-CM

## 2015-07-09 MED ORDER — METOPROLOL TARTRATE 25 MG PO TABS: 25.0000 mg | ORAL_TABLET | Freq: Two times a day (BID) | ORAL | Status: DC

## 2015-07-09 NOTE — Patient Instructions (Signed)
Your physician recommends that you schedule a follow-up appointment in: 6 WEEKS WITH DR Martinique  START METOPROLOL TARTRATE 25 MG ONE TABLET TWICE DAILY  OKAY TO HOLD ASPIRIN 5 DAYS PRIOR TO THE SURGERY IF NEEDED

## 2015-07-09 NOTE — Telephone Encounter (Signed)
Dr Percival Spanish office note was fax via epic to Phylliss Bob and [facility]

## 2015-07-09 NOTE — Progress Notes (Signed)
Cardiology Office Note   Date:  07/09/2015   ID:  [name], DOB 10-Jun-1946, MRN [id number]  PCP:  Shirline Frees, MD  Cardiologist:   Minus Breeding, MD   Chief Complaint  Patient presents with  . Medical Clearance    T11 Kyphoplasty surgery  . Edema    pt have swelling in feet and ankles      History of Present Illness: [name] is a 69 y.o. female who presents for preoperative evaluation prior to kyphoplasty. She has a history of coronary disease and SVT as described below. However, she's not had any recent cardiovascular issues. She has been limited by back pain. She gets around her house and can ambulate. With this she denies any cardiovascular symptoms. The patient denies any new symptoms such as chest discomfort, neck or arm discomfort. There has been no new shortness of breath, PND or orthopnea. There have been no reported palpitations, presyncope or syncope.   Past Medical History  Diagnosis Date  . Diabetes mellitus   . Hypertension   . Hypercholesteremia   . CAD (coronary artery disease)     a. NSTEMI in setting of UTI and SVT 12/2012 => LHC 01/08/13: Proximal LAD 30%, ostial diagonal 30-40%, proximal RCA 95%, inferior AK, EF 45-50% => PCI: Promus DES x 2 to RCA;  b. Echo 01/05/13: EF 42-59%, grade 1 diastolic dysfunction, MAC   . SVT (supraventricular tachycardia)   . Rectal ulceration     colonoscopy 12/2012  . Anxiety     Due to current back pain and has to lie flat.  . Arthritis   . History of blood transfusion   . Myocardial infarction 2014    Past Surgical History  Procedure Laterality Date  . Eye surgery      laser , cat bil  . Tubal ligation    . Kyphoplasty  12/27/2012    Procedure: KYPHOPLASTY;  Surgeon: Sinclair Ship, MD;  Location: [place];  Service: Orthopedics;  Laterality: Bilateral;  T-8 kyphoplasty  . Colonoscopy Left 01/12/2013    Procedure: COLONOSCOPY;  Surgeon: Wonda Horner, MD;  Location: [facility] ENDOSCOPY;  Service:  Endoscopy;  Laterality: Left;  . Colonoscopy N/A 01/14/2013    Procedure: COLONOSCOPY;  Surgeon: Wonda Horner, MD;  Location: [facility] ENDOSCOPY;  Service: Endoscopy;  Laterality: N/A;  Rm 2034, attemped Colon on 2-15=inadequate prep  . Coronary angioplasty  12/2012  . Left heart catheterization with coronary angiogram N/A 01/08/2013    Procedure: LEFT HEART CATHETERIZATION WITH CORONARY ANGIOGRAM;  Surgeon: Thayer Headings, MD;  Location: [facility] CATH LAB;  Service: Cardiovascular;  Laterality: N/A;  . Percutaneous stent intervention N/A 01/15/2013    Procedure: PERCUTANEOUS STENT INTERVENTION;  Surgeon: Peter M Martinique, MD;  Location: [facility] CATH LAB;  Service: Cardiovascular;  Laterality: N/A;  . Orif hip fracture Left 11/15/2014    Procedure: OPEN REDUCTION INTERNAL FIXATION HIP INTERTROCH;  Surgeon: Mcarthur Rossetti, MD;  Location: [place];  Service: Orthopedics;  Laterality: Left;     Current Outpatient Prescriptions  Medication Sig Dispense Refill  . aspirin EC 81 MG tablet Take 1 tablet (81 mg total) by mouth daily. 90 tablet 0  . Cholecalciferol (VITAMIN D3) 1000 UNITS CAPS Take 2,000 Units by mouth every morning.     . docusate sodium (COLACE) 100 MG capsule Take 1 capsule (100 mg total) by mouth daily as needed for mild constipation. 60 capsule 0  . furosemide (LASIX) 40 MG tablet Take 40  mg by mouth every morning.     Marland Kitchen HYDROcodone-acetaminophen (NORCO/VICODIN) 5-325 MG per tablet Take 1 tablet by mouth as needed.    . insulin glargine (LANTUS) 100 UNIT/ML injection Inject 0.05 mLs (5 Units total) into the skin 2 (two) times daily. (Patient taking differently: Inject 20 Units into the skin daily. ) 10 mL 12  . metFORMIN (GLUCOPHAGE) 1000 MG tablet Take 1,000 mg by mouth 2 (two) times daily with a meal.    . Multiple Vitamin (MULITIVITAMIN WITH MINERALS) TABS Take 1 tablet by mouth every morning.     . nitroGLYCERIN (NITROSTAT) 0.4 MG SL tablet Place 1 tablet (0.4 mg total) under the tongue every  5 (five) minutes as needed for chest pain. 25 tablet 3  . ondansetron (ZOFRAN ODT) 4 MG disintegrating tablet Take 1 tablet (4 mg total) by mouth every 8 (eight) hours as needed for nausea or vomiting. 20 tablet 0  . pioglitazone (ACTOS) 15 MG tablet Take 1 tablet (15 mg total) by mouth daily. 30 tablet 1  . ramipril (ALTACE) 10 MG capsule Take 10 mg by mouth at bedtime.    . simvastatin (ZOCOR) 40 MG tablet Take 40 mg by mouth at bedtime.     No current facility-administered medications for this visit.    Allergies:   Oxycodone    ROS:  Please see the history of present illness.   Otherwise, review of systems are positive for none.   All other systems are reviewed and negative.    PHYSICAL EXAM: VS:  BP 108/76 mmHg  Pulse 122  Wt 180 lb (81.647 kg) , BMI Body mass index is 27.38 kg/(m^2). GENERAL:  Well appearing NECK:  No jugular venous distention at 90 degrees, waveform within normal limits, carotid upstroke brisk and symmetric, no bruits, no thyromegaly LYMPHATICS:  No cervical adenopathy LUNGS:  Clear to auscultation bilaterally BACK:  No CVA tenderness CHEST:  Unremarkable HEART:  S1 and S2 within normal limits, no S3, no S4, no clicks, no rubs, no murmurs ABD:  Positive bowel sounds normal in frequency in pitch, no bruits, no rebound, no guarding, unable to assess midline mass or bruit with the patient seated. EXT:  2 plus pulses throughout, mild edema, no cyanosis no clubbing SKIN:  No rashes no nodules NEURO:  Cranial nerves II through XII grossly intact, motor grossly intact throughout PSYCH:  Cognitively intact, oriented to person place and time   EKG:  EKG is ordered today. The ekg ordered today demonstrates sinus tachycardia with sinus arrhythmia, axis within normal limits, QTC slightly prolonged, no acute ST-T wave changes   Recent Labs: 12/05/2014: B Natriuretic Peptide 76.4 06/28/2015: ALT 12*; BUN 20; Creatinine, Ser 0.68; Hemoglobin 13.4; Platelets 216; Potassium  3.5; Sodium 140    Lipid Panel No results found for: CHOL, TRIG, HDL, CHOLHDL, VLDL, LDLCALC, LDLDIRECT    Wt Readings from Last 3 Encounters:  07/09/15 180 lb (81.647 kg)  07/07/15 192 lb (87.091 kg)  06/30/15 180 lb (81.647 kg)      Other studies Reviewed: Additional studies/ records that were reviewed today include: Previous records. Review of the above records demonstrates:  Please see elsewhere in the note.     ASSESSMENT AND PLAN:  PREOP:  The patient has no new high risk findings. She is not going for a high-risk procedure from a cardiovascular standpoint. Therefore, based on ACC/AHA guidelines, the patient would be at acceptable risk for the planned procedure without further cardiovascular testing.  She could hold her  aspirin as needed for this procedure for 5 days prior.  TACHYCARDIA:  I suspect that this is related to pain and she's having from her back. It appears to be sinus tachycardia. I am going to give her metoprolol 25 mg twice daily. I do not think this should include the necessary treatment.   Current medicines are reviewed at length with the patient today.  The patient does not have concerns regarding medicines.  The following changes have been made:  As above  Labs/ tests ordered today include: None   No orders of the defined types were placed in this encounter.     Disposition:   FU with Dr. Martinique in about two months.      Signed, Minus Breeding, MD  07/09/2015 12:12 PM    [place]

## 2015-07-10 ENCOUNTER — Other Ambulatory Visit: Payer: Self-pay | Admitting: Orthopedic Surgery

## 2015-07-13 NOTE — Progress Notes (Signed)
Anesthesia Follow-up: See anesthesia note by Jonah Blue, FNP-BC on 07/07/15. Patient was scheduled for T11 kyphoplasty on 07/08/15 but was postponed to allow time for cardiology preoperative evaluation for history of NSTEMI with SVT 12/2012 and recurrent post-operative SVT 10/2014. She was seen by cardiologist Dr. Percival Spanish on 07/09/15. His note states: "PREOP: The patient has no new high risk findings. She is not going for a high-risk procedure from a cardiovascular standpoint. Therefore, based on ACC/AHA guidelines, the patient would be at acceptable risk for the planned procedure without further cardiovascular testing. She could hold her aspirin as needed for this procedure for 5 days prior.  TACHYCARDIA: I suspect that this is related to pain and she's having from her back. It appears to be sinus tachycardia. I am going to give her metoprolol 25 mg twice daily. I do not think this should include the necessary treatment." (Her EKG then showed ST at 126 bpm with short PR, non-specific ST/T wave abnormality.)  Surgery has been rescheduled for 07/15/15. She is scheduled as a same day work-up, so will need labs on arrival (labs on 06/28/15 were unremarkable). Hopefully heart rate (ST) will be better controlled on metoprolol. Further evaluation by her assigned anesthesiologist on the day of surgery.  George Hugh [facility] Short Stay Center/Anesthesiology Phone [phone] 07/13/2015 11:12 AM

## 2015-07-14 MED ORDER — POVIDONE-IODINE 7.5 % EX SOLN
CUTANEOUS | Status: DC
  Filled 2015-07-14: qty 118

## 2015-07-14 MED ORDER — MUPIROCIN 2 % EX OINT
1.0000 "application " | TOPICAL_OINTMENT | CUTANEOUS | Status: DC
  Filled 2015-07-14: qty 22

## 2015-07-14 MED ORDER — CEFAZOLIN SODIUM-DEXTROSE 2-3 GM-% IV SOLR
2.0000 g | INTRAVENOUS | Status: AC
  Administered 2015-07-15: 2 g via INTRAVENOUS
  Filled 2015-07-14: qty 50

## 2015-07-15 ENCOUNTER — Encounter (HOSPITAL_COMMUNITY): Payer: Self-pay | Admitting: Certified Registered Nurse Anesthetist

## 2015-07-15 ENCOUNTER — Ambulatory Visit (HOSPITAL_COMMUNITY): Payer: Medicare Other | Admitting: Vascular Surgery

## 2015-07-15 ENCOUNTER — Ambulatory Visit (HOSPITAL_COMMUNITY): Payer: Medicare Other

## 2015-07-15 ENCOUNTER — Encounter (HOSPITAL_COMMUNITY)
Admission: RE | Disposition: A | Discharge status: Home / Self Care | Payer: Self-pay | Source: Ambulatory Visit | Attending: Orthopedic Surgery

## 2015-07-15 ENCOUNTER — Ambulatory Visit (HOSPITAL_COMMUNITY)
Admission: RE | Admit: 2015-07-15 | Discharge: 2015-07-16 | Disposition: A | Discharge status: Home / Self Care | Payer: Medicare Other | Source: Ambulatory Visit | Attending: Orthopedic Surgery | Admitting: Orthopedic Surgery

## 2015-07-15 DIAGNOSIS — Z419 Encounter for procedure for purposes other than remedying health state, unspecified: Secondary | ICD-10-CM

## 2015-07-15 DIAGNOSIS — E780 Pure hypercholesterolemia: Secondary | ICD-10-CM | POA: Insufficient documentation

## 2015-07-15 DIAGNOSIS — M4854XA Collapsed vertebra, not elsewhere classified, thoracic region, initial encounter for fracture: Secondary | ICD-10-CM | POA: Insufficient documentation

## 2015-07-15 DIAGNOSIS — E119 Type 2 diabetes mellitus without complications: Secondary | ICD-10-CM | POA: Diagnosis not present

## 2015-07-15 DIAGNOSIS — I509 Heart failure, unspecified: Secondary | ICD-10-CM | POA: Diagnosis not present

## 2015-07-15 DIAGNOSIS — Z955 Presence of coronary angioplasty implant and graft: Secondary | ICD-10-CM | POA: Diagnosis not present

## 2015-07-15 DIAGNOSIS — Z01818 Encounter for other preprocedural examination: Secondary | ICD-10-CM

## 2015-07-15 DIAGNOSIS — I2510 Atherosclerotic heart disease of native coronary artery without angina pectoris: Secondary | ICD-10-CM | POA: Insufficient documentation

## 2015-07-15 DIAGNOSIS — Z8711 Personal history of peptic ulcer disease: Secondary | ICD-10-CM | POA: Insufficient documentation

## 2015-07-15 DIAGNOSIS — IMO0002 Reserved for concepts with insufficient information to code with codable children: Secondary | ICD-10-CM

## 2015-07-15 DIAGNOSIS — Z794 Long term (current) use of insulin: Secondary | ICD-10-CM | POA: Insufficient documentation

## 2015-07-15 DIAGNOSIS — I10 Essential (primary) hypertension: Secondary | ICD-10-CM | POA: Diagnosis not present

## 2015-07-15 DIAGNOSIS — Z7982 Long term (current) use of aspirin: Secondary | ICD-10-CM | POA: Diagnosis not present

## 2015-07-15 DIAGNOSIS — I252 Old myocardial infarction: Secondary | ICD-10-CM | POA: Insufficient documentation

## 2015-07-15 DIAGNOSIS — F419 Anxiety disorder, unspecified: Secondary | ICD-10-CM | POA: Diagnosis not present

## 2015-07-15 DIAGNOSIS — Z79899 Other long term (current) drug therapy: Secondary | ICD-10-CM | POA: Diagnosis not present

## 2015-07-15 HISTORY — PX: KYPHOPLASTY: SHX5884

## 2015-07-15 LAB — CBC WITH DIFFERENTIAL/PLATELET
BASOS ABS: 0 10*3/uL (ref 0.0–0.1)
Basophils Relative: 0 % (ref 0–1)
Eosinophils Absolute: 0.1 10*3/uL (ref 0.0–0.7)
Eosinophils Relative: 1 % (ref 0–5)
HCT: 43.8 % (ref 36.0–46.0)
HEMOGLOBIN: 14.7 g/dL (ref 12.0–15.0)
LYMPHS ABS: 2 10*3/uL (ref 0.7–4.0)
LYMPHS PCT: 22 % (ref 12–46)
MCH: 30.4 pg (ref 26.0–34.0)
MCHC: 33.6 g/dL (ref 30.0–36.0)
MCV: 90.7 fL (ref 78.0–100.0)
Monocytes Absolute: 0.8 10*3/uL (ref 0.1–1.0)
Monocytes Relative: 8 % (ref 3–12)
NEUTROS PCT: 69 % (ref 43–77)
Neutro Abs: 6.3 10*3/uL (ref 1.7–7.7)
Platelets: 225 10*3/uL (ref 150–400)
RBC: 4.83 MIL/uL (ref 3.87–5.11)
RDW: 12.9 % (ref 11.5–15.5)
WBC: 9.2 10*3/uL (ref 4.0–10.5)

## 2015-07-15 LAB — COMPREHENSIVE METABOLIC PANEL
ALK PHOS: 91 U/L (ref 38–126)
ALT: 16 U/L (ref 14–54)
AST: 25 U/L (ref 15–41)
Albumin: 3.4 g/dL — ABNORMAL LOW (ref 3.5–5.0)
Anion gap: 14 (ref 5–15)
BUN: 17 mg/dL (ref 6–20)
CALCIUM: 9.5 mg/dL (ref 8.9–10.3)
CO2: 22 mmol/L (ref 22–32)
CREATININE: 0.84 mg/dL (ref 0.44–1.00)
Chloride: 102 mmol/L (ref 101–111)
Glucose, Bld: 119 mg/dL — ABNORMAL HIGH (ref 65–99)
Potassium: 4 mmol/L (ref 3.5–5.1)
SODIUM: 138 mmol/L (ref 135–145)
Total Bilirubin: 1.2 mg/dL (ref 0.3–1.2)
Total Protein: 7.7 g/dL (ref 6.5–8.1)

## 2015-07-15 LAB — GLUCOSE, CAPILLARY
GLUCOSE-CAPILLARY: 91 mg/dL (ref 65–99)
GLUCOSE-CAPILLARY: 98 mg/dL (ref 65–99)
Glucose-Capillary: 117 mg/dL — ABNORMAL HIGH (ref 65–99)
Glucose-Capillary: 95 mg/dL (ref 65–99)

## 2015-07-15 LAB — PROTIME-INR
INR: 1.05 (ref 0.00–1.49)
Prothrombin Time: 13.9 seconds (ref 11.6–15.2)

## 2015-07-15 LAB — SURGICAL PCR SCREEN
MRSA, PCR: NEGATIVE
Staphylococcus aureus: NEGATIVE

## 2015-07-15 LAB — APTT: aPTT: 37 seconds (ref 24–37)

## 2015-07-15 SURGERY — KYPHOPLASTY
Anesthesia: General

## 2015-07-15 MED ORDER — ALUM & MAG HYDROXIDE-SIMETH 200-200-20 MG/5ML PO SUSP: 30.0000 mL | Freq: Four times a day (QID) | ORAL | Status: DC | PRN

## 2015-07-15 MED ORDER — FENTANYL CITRATE (PF) 250 MCG/5ML IJ SOLN
INTRAMUSCULAR | Status: AC
  Filled 2015-07-15: qty 5

## 2015-07-15 MED ORDER — ROCURONIUM BROMIDE 100 MG/10ML IV SOLN
INTRAVENOUS | Status: DC | PRN
  Administered 2015-07-15: 40 mg via INTRAVENOUS

## 2015-07-15 MED ORDER — OXYCODONE-ACETAMINOPHEN 5-325 MG PO TABS
1.0000 | ORAL_TABLET | ORAL | Status: DC | PRN
Start: 2015-07-15 — End: 2015-07-16

## 2015-07-15 MED ORDER — ARTIFICIAL TEARS OP OINT
TOPICAL_OINTMENT | OPHTHALMIC | Status: DC | PRN
  Administered 2015-07-15: 1 via OPHTHALMIC

## 2015-07-15 MED ORDER — PHENOL 1.4 % MT LIQD: 1.0000 | OROMUCOSAL | Status: DC | PRN

## 2015-07-15 MED ORDER — INSULIN GLARGINE 100 UNIT/ML ~~LOC~~ SOLN
20.0000 [IU] | Freq: Every day | SUBCUTANEOUS | Status: DC
  Filled 2015-07-15 (×2): qty 0.2

## 2015-07-15 MED ORDER — VITAMIN D3 25 MCG (1000 UNIT) PO TABS
2000.0000 [IU] | ORAL_TABLET | Freq: Every day | ORAL | Status: DC
  Filled 2015-07-15 (×2): qty 2

## 2015-07-15 MED ORDER — PROPOFOL 10 MG/ML IV BOLUS
INTRAVENOUS | Status: AC
  Filled 2015-07-15: qty 20

## 2015-07-15 MED ORDER — PROPOFOL 10 MG/ML IV BOLUS
INTRAVENOUS | Status: DC | PRN
  Administered 2015-07-15: 150 mg via INTRAVENOUS

## 2015-07-15 MED ORDER — ACETAMINOPHEN 325 MG PO TABS: 650.0000 mg | ORAL_TABLET | ORAL | Status: DC | PRN

## 2015-07-15 MED ORDER — MORPHINE SULFATE (PF) 2 MG/ML IV SOLN: 1.0000 mg | INTRAVENOUS | Status: DC | PRN

## 2015-07-15 MED ORDER — MENTHOL 3 MG MT LOZG: 1.0000 | LOZENGE | OROMUCOSAL | Status: DC | PRN

## 2015-07-15 MED ORDER — IOHEXOL 300 MG/ML  SOLN
INTRAMUSCULAR | Status: DC | PRN
  Administered 2015-07-15: 50 mL

## 2015-07-15 MED ORDER — SODIUM CHLORIDE 0.9 % IJ SOLN
3.0000 mL | Freq: Two times a day (BID) | INTRAMUSCULAR | Status: DC
  Administered 2015-07-15: 3 mL via INTRAVENOUS

## 2015-07-15 MED ORDER — LACTATED RINGERS IV SOLN
INTRAVENOUS | Status: DC
  Administered 2015-07-15: 10:00:00 via INTRAVENOUS

## 2015-07-15 MED ORDER — VITAMIN D3 25 MCG (1000 UT) PO CAPS: 2000.0000 [IU] | ORAL_CAPSULE | Freq: Every morning | ORAL | Status: DC

## 2015-07-15 MED ORDER — FLEET ENEMA 7-19 GM/118ML RE ENEM: 1.0000 | ENEMA | Freq: Once | RECTAL | Status: DC | PRN

## 2015-07-15 MED ORDER — RAMIPRIL 10 MG PO CAPS
10.0000 mg | ORAL_CAPSULE | Freq: Every day | ORAL | Status: DC
  Administered 2015-07-15: 10 mg via ORAL
  Filled 2015-07-15 (×2): qty 1

## 2015-07-15 MED ORDER — METFORMIN HCL 500 MG PO TABS
1000.0000 mg | ORAL_TABLET | Freq: Two times a day (BID) | ORAL | Status: DC
  Filled 2015-07-15 (×2): qty 2

## 2015-07-15 MED ORDER — METOPROLOL TARTRATE 25 MG PO TABS
25.0000 mg | ORAL_TABLET | Freq: Once | ORAL | Status: AC
  Administered 2015-07-15: 25 mg via ORAL
  Filled 2015-07-15: qty 1

## 2015-07-15 MED ORDER — METOPROLOL TARTRATE 12.5 MG HALF TABLET
ORAL_TABLET | ORAL | Status: AC
  Filled 2015-07-15: qty 2

## 2015-07-15 MED ORDER — SUGAMMADEX SODIUM 500 MG/5ML IV SOLN
INTRAVENOUS | Status: DC | PRN
  Administered 2015-07-15: 163.2 mg via INTRAVENOUS

## 2015-07-15 MED ORDER — ROCURONIUM BROMIDE 50 MG/5ML IV SOLN
INTRAVENOUS | Status: AC
  Filled 2015-07-15: qty 1

## 2015-07-15 MED ORDER — SODIUM CHLORIDE 0.9 % IJ SOLN: 3.0000 mL | INTRAMUSCULAR | Status: DC | PRN

## 2015-07-15 MED ORDER — SIMVASTATIN 40 MG PO TABS
40.0000 mg | ORAL_TABLET | Freq: Every day | ORAL | Status: DC
  Administered 2015-07-15: 40 mg via ORAL
  Filled 2015-07-15 (×2): qty 1

## 2015-07-15 MED ORDER — ZOLPIDEM TARTRATE 5 MG PO TABS: 5.0000 mg | ORAL_TABLET | Freq: Every evening | ORAL | Status: DC | PRN

## 2015-07-15 MED ORDER — ONDANSETRON HCL 4 MG PO TABS
4.0000 mg | ORAL_TABLET | Freq: Once | ORAL | Status: AC
  Administered 2015-07-15: 4 mg via ORAL
  Filled 2015-07-15 (×3): qty 1

## 2015-07-15 MED ORDER — ACETAMINOPHEN 650 MG RE SUPP: 650.0000 mg | RECTAL | Status: DC | PRN

## 2015-07-15 MED ORDER — LIDOCAINE HCL (CARDIAC) 20 MG/ML IV SOLN
INTRAVENOUS | Status: AC
  Filled 2015-07-15: qty 5

## 2015-07-15 MED ORDER — BISACODYL 5 MG PO TBEC: 5.0000 mg | DELAYED_RELEASE_TABLET | Freq: Every day | ORAL | Status: DC | PRN

## 2015-07-15 MED ORDER — SODIUM CHLORIDE 0.9 % IV SOLN
10.0000 mg | INTRAVENOUS | Status: DC | PRN
  Administered 2015-07-15: 10 ug/min via INTRAVENOUS

## 2015-07-15 MED ORDER — ONDANSETRON HCL 4 MG/2ML IJ SOLN
4.0000 mg | INTRAMUSCULAR | Status: DC | PRN
Start: 2015-07-15 — End: 2015-07-16

## 2015-07-15 MED ORDER — PIOGLITAZONE HCL 15 MG PO TABS
15.0000 mg | ORAL_TABLET | Freq: Every day | ORAL | Status: DC
  Filled 2015-07-15 (×2): qty 1

## 2015-07-15 MED ORDER — DIAZEPAM 5 MG PO TABS: 5.0000 mg | ORAL_TABLET | Freq: Four times a day (QID) | ORAL | Status: DC | PRN

## 2015-07-15 MED ORDER — ADULT MULTIVITAMIN W/MINERALS CH
1.0000 | ORAL_TABLET | Freq: Every morning | ORAL | Status: DC
  Filled 2015-07-15: qty 1

## 2015-07-15 MED ORDER — LIDOCAINE HCL (CARDIAC) 20 MG/ML IV SOLN
INTRAVENOUS | Status: DC | PRN
  Administered 2015-07-15: 50 mg via INTRAVENOUS

## 2015-07-15 MED ORDER — FENTANYL CITRATE (PF) 100 MCG/2ML IJ SOLN
INTRAMUSCULAR | Status: DC | PRN
Start: 2015-07-15 — End: 2015-07-15
  Administered 2015-07-15: 100 ug via INTRAVENOUS
  Administered 2015-07-15: 50 ug via INTRAVENOUS

## 2015-07-15 MED ORDER — FUROSEMIDE 40 MG PO TABS
40.0000 mg | ORAL_TABLET | Freq: Every morning | ORAL | Status: DC
  Filled 2015-07-15: qty 1

## 2015-07-15 MED ORDER — DOCUSATE SODIUM 100 MG PO CAPS: 100.0000 mg | ORAL_CAPSULE | Freq: Every day | ORAL | Status: DC | PRN

## 2015-07-15 MED ORDER — NITROGLYCERIN 0.4 MG SL SUBL
0.4000 mg | SUBLINGUAL_TABLET | SUBLINGUAL | Status: DC | PRN
Start: 2015-07-15 — End: 2015-07-16

## 2015-07-15 MED ORDER — BUPIVACAINE-EPINEPHRINE (PF) 0.25% -1:200000 IJ SOLN
INTRAMUSCULAR | Status: AC
  Filled 2015-07-15: qty 30

## 2015-07-15 MED ORDER — DOCUSATE SODIUM 100 MG PO CAPS
100.0000 mg | ORAL_CAPSULE | Freq: Two times a day (BID) | ORAL | Status: DC
  Administered 2015-07-15: 100 mg via ORAL

## 2015-07-15 MED ORDER — BACITRACIN ZINC 500 UNIT/GM EX OINT
TOPICAL_OINTMENT | CUTANEOUS | Status: AC
  Filled 2015-07-15: qty 28.35

## 2015-07-15 MED ORDER — MIDAZOLAM HCL 2 MG/2ML IJ SOLN
INTRAMUSCULAR | Status: AC
  Filled 2015-07-15: qty 4

## 2015-07-15 MED ORDER — SENNOSIDES-DOCUSATE SODIUM 8.6-50 MG PO TABS: 1.0000 | ORAL_TABLET | Freq: Every evening | ORAL | Status: DC | PRN

## 2015-07-15 MED ORDER — ONDANSETRON HCL 4 MG/2ML IJ SOLN
INTRAMUSCULAR | Status: DC | PRN
  Administered 2015-07-15: 4 mg via INTRAVENOUS

## 2015-07-15 MED ORDER — METOPROLOL TARTRATE 25 MG PO TABS
25.0000 mg | ORAL_TABLET | Freq: Two times a day (BID) | ORAL | Status: DC
  Administered 2015-07-15: 25 mg via ORAL
  Filled 2015-07-15 (×3): qty 1

## 2015-07-15 MED ORDER — SUGAMMADEX SODIUM 200 MG/2ML IV SOLN
INTRAVENOUS | Status: AC
  Filled 2015-07-15: qty 2

## 2015-07-15 MED ORDER — ONDANSETRON 4 MG PO TBDP
4.0000 mg | ORAL_TABLET | Freq: Three times a day (TID) | ORAL | Status: DC | PRN
  Administered 2015-07-16: 4 mg via ORAL
  Filled 2015-07-15 (×2): qty 1

## 2015-07-15 MED ORDER — CEFAZOLIN SODIUM 1-5 GM-% IV SOLN
1.0000 g | Freq: Three times a day (TID) | INTRAVENOUS | Status: AC
  Administered 2015-07-15 – 2015-07-16 (×2): 1 g via INTRAVENOUS
  Filled 2015-07-15 (×2): qty 50

## 2015-07-15 SURGICAL SUPPLY — 45 items
BANDAGE ADH SHEER 1  50/CT (GAUZE/BANDAGES/DRESSINGS) ×6 IMPLANT
BLADE SURG 15 STRL LF DISP TIS (BLADE) ×1 IMPLANT
BLADE SURG 15 STRL SS (BLADE) ×2
CEMENT BONE KYPHX HV R (Orthopedic Implant) ×3 IMPLANT
CEMENT KYPHON C01A KIT/MIXER (Cement) ×3 IMPLANT
COVER MAYO STAND STRL (DRAPES) ×3 IMPLANT
COVER SURGICAL LIGHT HANDLE (MISCELLANEOUS) ×3 IMPLANT
CURETTE WEDGE 8.5MM KYPHX (MISCELLANEOUS) ×3 IMPLANT
DERMABOND ADVANCED (GAUZE/BANDAGES/DRESSINGS)
DERMABOND ADVANCED .7 DNX12 (GAUZE/BANDAGES/DRESSINGS) IMPLANT
DRAPE C-ARM 42X72 X-RAY (DRAPES) ×3 IMPLANT
DRAPE INCISE IOBAN 66X45 STRL (DRAPES) ×6 IMPLANT
DRAPE LAPAROTOMY T 102X78X121 (DRAPES) ×3 IMPLANT
DRAPE PROXIMA HALF (DRAPES) ×3 IMPLANT
DRAPE SURG 17X23 STRL (DRAPES) ×12 IMPLANT
DURAPREP 26ML APPLICATOR (WOUND CARE) ×3 IMPLANT
GAUZE SPONGE 4X4 16PLY XRAY LF (GAUZE/BANDAGES/DRESSINGS) ×3 IMPLANT
GLOVE BIO SURGEON STRL SZ7 (GLOVE) ×3 IMPLANT
GLOVE BIO SURGEON STRL SZ8 (GLOVE) ×3 IMPLANT
GLOVE BIOGEL PI IND STRL 7.0 (GLOVE) ×1 IMPLANT
GLOVE BIOGEL PI IND STRL 8 (GLOVE) ×1 IMPLANT
GLOVE BIOGEL PI INDICATOR 7.0 (GLOVE) ×2
GLOVE BIOGEL PI INDICATOR 8 (GLOVE) ×2
GOWN STRL REUS W/ TWL LRG LVL3 (GOWN DISPOSABLE) ×2 IMPLANT
GOWN STRL REUS W/ TWL XL LVL3 (GOWN DISPOSABLE) ×1 IMPLANT
GOWN STRL REUS W/TWL LRG LVL3 (GOWN DISPOSABLE) ×4
GOWN STRL REUS W/TWL XL LVL3 (GOWN DISPOSABLE) ×2
KIT BASIN OR (CUSTOM PROCEDURE TRAY) ×3 IMPLANT
KIT ROOM TURNOVER OR (KITS) ×3 IMPLANT
MIXER KYPHON (MISCELLANEOUS) ×3 IMPLANT
NEEDLE 22X1 1/2 (OR ONLY) (NEEDLE) IMPLANT
NEEDLE HYPO 25X1 1.5 SAFETY (NEEDLE) IMPLANT
NEEDLE SPNL 18GX3.5 QUINCKE PK (NEEDLE) ×6 IMPLANT
NS IRRIG 1000ML POUR BTL (IV SOLUTION) ×3 IMPLANT
PACK SURGICAL SETUP 50X90 (CUSTOM PROCEDURE TRAY) ×3 IMPLANT
PAD ARMBOARD 7.5X6 YLW CONV (MISCELLANEOUS) ×6 IMPLANT
POSITIONER HEAD PRONE TRACH (MISCELLANEOUS) ×3 IMPLANT
SPONGE GAUZE 4X4 12PLY STER LF (GAUZE/BANDAGES/DRESSINGS) ×3 IMPLANT
SUT MNCRL AB 4-0 PS2 18 (SUTURE) ×3 IMPLANT
SYR BULB IRRIGATION 50ML (SYRINGE) ×3 IMPLANT
SYR CONTROL 10ML LL (SYRINGE) ×3 IMPLANT
TAPE CLOTH SURG 6X10 WHT LF (GAUZE/BANDAGES/DRESSINGS) ×3 IMPLANT
TOWEL OR 17X24 6PK STRL BLUE (TOWEL DISPOSABLE) ×3 IMPLANT
TOWEL OR 17X26 10 PK STRL BLUE (TOWEL DISPOSABLE) ×3 IMPLANT
TRAY KYPHOPAK 15/3 ONESTEP 1ST (MISCELLANEOUS) ×3 IMPLANT

## 2015-07-15 NOTE — Transfer of Care (Signed)
Immediate Anesthesia Transfer of Care Note  Patient: [name]  Procedure(s) Performed: Procedure(s) with comments: T11 KYPHOPLASTY (N/A) - T11 kyphoplasty  Patient Location: PACU  Anesthesia Type:General  Level of Consciousness: awake, alert , oriented and patient cooperative  Airway & Oxygen Therapy: Patient Spontanous Breathing  Post-op Assessment: Report given to RN, Post -op Vital signs reviewed and stable, Patient moving all extremities and Patient moving all extremities X 4  Post vital signs: Reviewed and stable  Last Vitals:  Filed Vitals:   07/15/15 1000  BP: 124/81  Pulse: 82  Temp: 36.4 C  Resp: 20    Complications: No apparent anesthesia complications

## 2015-07-15 NOTE — Progress Notes (Signed)
Patient was getting discharged and stated she did not have anyone to stay with her overnight. She is unsteady and two person assist to chair. Kayla PA notified and orders received to stay over night observation.

## 2015-07-15 NOTE — Anesthesia Procedure Notes (Signed)
Procedure Name: Intubation Date/Time: 07/15/2015 12:00 PM Performed by: Rogers Blocker Pre-anesthesia Checklist: Patient identified, Timeout performed, Emergency Drugs available, Suction available and Patient being monitored Patient Re-evaluated:Patient Re-evaluated prior to inductionOxygen Delivery Method: Circle system utilized Preoxygenation: Pre-oxygenation with 100% oxygen Intubation Type: IV induction Ventilation: Mask ventilation without difficulty Laryngoscope Size: Mac and 4 Grade View: Grade I Tube type: Oral Tube size: 7.5 mm Number of attempts: 1 Airway Equipment and Method: Stylet Placement Confirmation: ETT inserted through vocal cords under direct vision,  breath sounds checked- equal and bilateral,  positive ETCO2 and CO2 detector Secured at: 21 cm Tube secured with: Tape Dental Injury: Teeth and Oropharynx as per pre-operative assessment

## 2015-07-15 NOTE — Discharge Instructions (Signed)
See separate handout per Dr. Lynann Bologna with discharge instructions   prescriptions for Valium for muscle spasms and Percocet for pain hand written per Dr. Lynann Bologna   Follow up appointment on card attached to front of handout with discharge instructions on it

## 2015-07-15 NOTE — Plan of Care (Signed)
Problem: Consults Goal: Diagnosis - Spinal Surgery Outcome: Completed/Met Date Met:  07/15/15 KYPHOPLASTY

## 2015-07-15 NOTE — H&P (Signed)
PREOPERATIVE H&P  Chief Complaint: Mid back pain  HPI: [name] is a 69 y.o. female who presents with ongoing pain in the mid back  MRI reveals edema in the T11 vertebral body c/w active compression fracture  Patient has failed multiple forms of conservative care and continues to have pain (see office notes for additional details regarding the patient's full course of treatment)  Past Medical History  Diagnosis Date  . Diabetes mellitus   . Hypertension   . Hypercholesteremia   . CAD (coronary artery disease)     a. NSTEMI in setting of UTI and SVT 12/2012 => LHC 01/08/13: Proximal LAD 30%, ostial diagonal 30-40%, proximal RCA 95%, inferior AK, EF 45-50% => PCI: Promus DES x 2 to RCA;  b. Echo 01/05/13: EF 85-02%, grade 1 diastolic dysfunction, MAC   . SVT (supraventricular tachycardia)   . Rectal ulceration     colonoscopy 12/2012  . Anxiety     Due to current back pain and has to lie flat.  . Arthritis   . History of blood transfusion   . Myocardial infarction 2014   Past Surgical History  Procedure Laterality Date  . Eye surgery      laser , cat bil  . Tubal ligation    . Kyphoplasty  12/27/2012    Procedure: KYPHOPLASTY;  Surgeon: Sinclair Ship, MD;  Location: [place];  Service: Orthopedics;  Laterality: Bilateral;  T-8 kyphoplasty  . Colonoscopy Left 01/12/2013    Procedure: COLONOSCOPY;  Surgeon: Wonda Horner, MD;  Location: [facility] ENDOSCOPY;  Service: Endoscopy;  Laterality: Left;  . Colonoscopy N/A 01/14/2013    Procedure: COLONOSCOPY;  Surgeon: Wonda Horner, MD;  Location: [facility] ENDOSCOPY;  Service: Endoscopy;  Laterality: N/A;  Rm 2034, attemped Colon on 2-15=inadequate prep  . Coronary angioplasty  12/2012  . Left heart catheterization with coronary angiogram N/A 01/08/2013    Procedure: LEFT HEART CATHETERIZATION WITH CORONARY ANGIOGRAM;  Surgeon: Thayer Headings, MD;  Location: [facility] CATH LAB;  Service: Cardiovascular;  Laterality: N/A;  . Percutaneous stent  intervention N/A 01/15/2013    Procedure: PERCUTANEOUS STENT INTERVENTION;  Surgeon: Peter M Martinique, MD;  Location: [facility] CATH LAB;  Service: Cardiovascular;  Laterality: N/A;  . Orif hip fracture Left 11/15/2014    Procedure: OPEN REDUCTION INTERNAL FIXATION HIP INTERTROCH;  Surgeon: Mcarthur Rossetti, MD;  Location: [place];  Service: Orthopedics;  Laterality: Left;   Social History   Social History  . Marital Status: Widowed    Spouse Name: N/A  . Number of Children: N/A  . Years of Education: N/A   Social History Main Topics  . Smoking status: Never Smoker   . Smokeless tobacco: Never Used  . Alcohol Use: No  . Drug Use: No  . Sexual Activity: Yes   Other Topics Concern  . Not on file   Social History Narrative   Family History  Problem Relation Age of Onset  . Hypertension Brother    Allergies  Allergen Reactions  . Oxycodone Nausea Only   Prior to Admission medications   Medication Sig Start Date End Date Taking? Authorizing Provider  aspirin EC 81 MG tablet Take 1 tablet (81 mg total) by mouth daily. 04/16/13  Yes Liliane Shi, PA-C  Cholecalciferol (VITAMIN D3) 1000 UNITS CAPS Take 2,000 Units by mouth every morning.    Yes Historical Provider, MD  docusate sodium (COLACE) 100 MG capsule Take 1 capsule (100 mg total) by mouth  daily as needed for mild constipation. 06/22/15  Yes Pamella Pert, MD  furosemide (LASIX) 40 MG tablet Take 40 mg by mouth every morning.    Yes Historical Provider, MD  HYDROcodone-acetaminophen (NORCO/VICODIN) 5-325 MG per tablet Take 1 tablet by mouth every 6 (six) hours as needed for moderate pain.  07/03/15  Yes Historical Provider, MD  insulin glargine (LANTUS) 100 UNIT/ML injection Inject 0.05 mLs (5 Units total) into the skin 2 (two) times daily. Patient taking differently: Inject 20 Units into the skin daily.  11/13/13 07/02/16 Yes Christina P Rama, MD  metFORMIN (GLUCOPHAGE) 1000 MG tablet Take 1,000 mg by mouth 2 (two) times daily with  a meal.   Yes Historical Provider, MD  metoprolol tartrate (LOPRESSOR) 25 MG tablet Take 1 tablet (25 mg total) by mouth 2 (two) times daily. 07/09/15  Yes Minus Breeding, MD  Multiple Vitamin (MULITIVITAMIN WITH MINERALS) TABS Take 1 tablet by mouth every morning.    Yes Historical Provider, MD  nitroGLYCERIN (NITROSTAT) 0.4 MG SL tablet Place 1 tablet (0.4 mg total) under the tongue every 5 (five) minutes as needed for chest pain. 04/16/13  Yes Liliane Shi, PA-C  ondansetron (ZOFRAN ODT) 4 MG disintegrating tablet Take 1 tablet (4 mg total) by mouth every 8 (eight) hours as needed for nausea or vomiting. 06/28/15  Yes Jule Ser, DO  pioglitazone (ACTOS) 15 MG tablet Take 1 tablet (15 mg total) by mouth daily. 11/18/14  Yes Kelvin Cellar, MD  ramipril (ALTACE) 10 MG capsule Take 10 mg by mouth at bedtime. 06/08/15  Yes Historical Provider, MD  simvastatin (ZOCOR) 40 MG tablet Take 40 mg by mouth at bedtime.   Yes Historical Provider, MD     All other systems have been reviewed and were otherwise negative with the exception of those mentioned in the HPI and as above.  Physical Exam: There were no vitals filed for this visit.  General: Alert, no acute distress Cardiovascular: No pedal edema Respiratory: No cyanosis, no use of accessory musculature Skin: No lesions in the area of chief complaint Neurologic: Sensation intact distally Psychiatric: Patient is competent for consent with normal mood and affect Lymphatic: No axillary or cervical lymphadenopathy  MUSCULOSKELETAL: + TTP mid back  Assessment/Plan: T11 compression fracture Plan for Procedure(s): T11 KYPHOPLASTY   Sinclair Ship, MD 07/15/2015 8:17 AM

## 2015-07-15 NOTE — Anesthesia Preprocedure Evaluation (Addendum)
Anesthesia Evaluation  Patient identified by MRN, date of birth, ID band Patient awake    Reviewed: Allergy & Precautions, NPO status   Airway Mallampati: II  TM Distance: >3 FB Neck ROM: Full    Dental   Pulmonary  breath sounds clear to auscultation        Cardiovascular hypertension, + CAD, + Past MI and +CHF Rhythm:Regular Rate:Normal     Neuro/Psych    GI/Hepatic Neg liver ROS, PUD,   Endo/Other  diabetes  Renal/GU negative Renal ROS     Musculoskeletal   Abdominal   Peds  Hematology   Anesthesia Other Findings   Reproductive/Obstetrics                            Anesthesia Physical Anesthesia Plan  ASA: III  Anesthesia Plan: General   Post-op Pain Management:    Induction: Intravenous  Airway Management Planned: Oral ETT  Additional Equipment:   Intra-op Plan:   Post-operative Plan: Extubation in OR  Informed Consent: I have reviewed the patients History and Physical, chart, labs and discussed the procedure including the risks, benefits and alternatives for the proposed anesthesia with the patient or authorized representative who has indicated his/her understanding and acceptance.   Dental advisory given  Plan Discussed with: CRNA and Anesthesiologist  Anesthesia Plan Comments:        Anesthesia Quick Evaluation

## 2015-07-16 ENCOUNTER — Encounter (HOSPITAL_COMMUNITY): Payer: Self-pay | Admitting: Orthopedic Surgery

## 2015-07-16 DIAGNOSIS — M4854XA Collapsed vertebra, not elsewhere classified, thoracic region, initial encounter for fracture: Secondary | ICD-10-CM | POA: Diagnosis not present

## 2015-07-16 LAB — GLUCOSE, CAPILLARY: Glucose-Capillary: 89 mg/dL (ref 65–99)

## 2015-07-16 NOTE — Anesthesia Postprocedure Evaluation (Signed)
  Anesthesia Post-op Note  Patient: [name]  Procedure(s) Performed: Procedure(s) with comments: T11 KYPHOPLASTY (N/A) - T11 kyphoplasty  Patient Location: PACU  Anesthesia Type:General  Level of Consciousness: awake  Airway and Oxygen Therapy: Patient Spontanous Breathing  Post-op Pain: mild  Post-op Assessment: Post-op Vital signs reviewed              Post-op Vital Signs: Reviewed  Last Vitals:  Filed Vitals:   07/16/15 0820  BP: 149/61  Pulse: 73  Temp: 36.8 C  Resp: 16    Complications: No apparent anesthesia complications

## 2015-07-16 NOTE — Progress Notes (Signed)
    Patient doing well Patient reports substantial improvement in low back pain Has been ambulating Feels much better   Physical Exam: Filed Vitals:   07/16/15 0400  BP: 130/62  Pulse: 75  Temp: 97.9 F (36.6 C)  Resp: 18    Dressing in place NVI  POD #1 s/p T11 kyphoplasty procedure, doing great  - encourage ambulation - Percocet for pain, Valium for muscle spasms - d/c home today

## 2015-07-16 NOTE — Op Note (Signed)
[name], [name] NO.:  [id number]  MEDICAL RECORD NO.:  [id number]  LOCATION:  [id number]                        FACILITY:  [facility]  PHYSICIAN:  Phylliss Bob, MD      DATE OF BIRTH:  February 19, 1946  DATE OF PROCEDURE:  07/15/2015                              OPERATIVE REPORT   PREOPERATIVE DIAGNOSIS:  T11 compression fracture with no history of trauma.  POSTOPERATIVE DIAGNOSIS:  T11 compression fracture with no history of trauma.  PROCEDURE: 1. T11 kyphoplasty. 2. Biopsy of the T11 vertebral body.  SURGEON:  Phylliss Bob, MD  ASSISTANT:  Pricilla Holm, PA-C  ANESTHESIA:  General endotracheal anesthesia.  COMPLICATIONS:  None.  DISPOSITION:  Stable.  ESTIMATED BLOOD LOSS:  Minimal.  INDICATIONS FOR SURGERY:  Briefly, [name] is a very pleasant 69 year old female, who did present to me with severe pain in her mid back.  Of note, she is status post a previous T8 kyphoplasty procedure.  When I did re-evaluate her in 2016, she did have substantial pain in her back when she simply awoken from bed.  The pain had been severe and debilitating pain ever since that first began.  The pain had been getting worse.  She has been having trouble even ambulating and taking care of herself at home.  An MRI did reveal an acute/subacute T11 compression fracture with edema noted in the T11 vertebral body.  Old deformities were identified at other levels.  Given her ongoing and rather debilitating pain and inability to care for herself at home, we did discuss proceeding with a kyphoplasty procedure.  We did also discuss nonoperative measures, but she did elect to proceed with surgery.  Of note, given the fact that the patient did have no history of trauma, I did wish to proceed with a biopsy of the vertebral body, to rule out a pathologic compression fracture.  OPERATIVE DETAILS:  On July 15, 2015, the patient was brought to surgery and general endotracheal  anesthesia was administered.  The patient was placed prone on a well-padded flat Jackson bed.  Gel rolls were placed under the patient's chest and hips.  Antibiotics were given. I then advanced Jamshidi needles across the T11 pedicles bilaterally. The Jamshidis was advanced into the vertebral body.  I did use biplanar fluoroscopy to ensure appropriate trajectory of the Jamshidi needles.  I then drilled over the needles.  Of note, a very minimal resistance was noted.  Distracted the bone was noted to be very poor.  There was soft tissue noted in the drill bilaterally, which was cleaned from the drill and placed into a specimen cup and sent to the pathology lab for additional evaluation.  After drilling and curetting the vertebral body, I did insert kyphoplasty balloons bilaterally.  The balloons did readily inflate, using approximately 4.5 mL per side.  Of note, there was very minimal resistance met when inflating the balloons.  The pressures never exceeded 120.  The balloons did inflate uniformly.  After the cement was mixed, the cement was introduced into the vertebral body through the trocars.  I did introduce a total of 9 mL of cement into the vertebral body.  There was no abnormal  extravasation of cement either posteriorly to the spinal canal or anterior to the vertebral body or lateral to it. I did note a partial restoration of the collapse of the superior endplate of T02 vertebral body.  The cement was then allowed to harden. The Jamshidi was removed.  The wound was irrigated.  The wound was then closed with 3-0 Monocryl.  Bacitracin and a sterile dressing was applied.  The patient was then awoken from general endotracheal anesthesia and transferred to recovery in stable condition.     Phylliss Bob, MD     MD/MEDQ  D:  07/15/2015  T:  07/16/2015  Job:  [id number]  cc:   Azalia Bilis, M.D.

## 2015-07-16 NOTE — Progress Notes (Signed)
Pt given D/C instructions with Rx's, verbal understanding was provided. Pt's incision is clean and dry with no sign of infection. Pt's IV was removed prior to D/C. Pt D/C'd home via wheelchair @ 0920 per MD order. Pt is stable @ D/C and has no other needs at this time. Holli Humbles, RN

## 2015-07-18 ENCOUNTER — Emergency Department (HOSPITAL_COMMUNITY): Payer: Medicare Other

## 2015-07-18 ENCOUNTER — Observation Stay (HOSPITAL_COMMUNITY)
Admission: EM | Admit: 2015-07-18 | Discharge: 2015-07-23 | Disposition: A | Discharge status: Skilled Nursing Facility | Payer: Medicare Other | Attending: Internal Medicine | Admitting: Internal Medicine

## 2015-07-18 ENCOUNTER — Encounter (HOSPITAL_COMMUNITY): Payer: Self-pay | Admitting: Emergency Medicine

## 2015-07-18 DIAGNOSIS — R55 Syncope and collapse: Secondary | ICD-10-CM | POA: Diagnosis not present

## 2015-07-18 DIAGNOSIS — M25551 Pain in right hip: Secondary | ICD-10-CM | POA: Insufficient documentation

## 2015-07-18 DIAGNOSIS — I252 Old myocardial infarction: Secondary | ICD-10-CM | POA: Insufficient documentation

## 2015-07-18 DIAGNOSIS — E119 Type 2 diabetes mellitus without complications: Secondary | ICD-10-CM

## 2015-07-18 DIAGNOSIS — Z9889 Other specified postprocedural states: Secondary | ICD-10-CM | POA: Diagnosis not present

## 2015-07-18 DIAGNOSIS — I071 Rheumatic tricuspid insufficiency: Secondary | ICD-10-CM | POA: Insufficient documentation

## 2015-07-18 DIAGNOSIS — R42 Dizziness and giddiness: Secondary | ICD-10-CM | POA: Insufficient documentation

## 2015-07-18 DIAGNOSIS — W19XXXA Unspecified fall, initial encounter: Secondary | ICD-10-CM | POA: Insufficient documentation

## 2015-07-18 DIAGNOSIS — M545 Low back pain: Secondary | ICD-10-CM | POA: Insufficient documentation

## 2015-07-18 DIAGNOSIS — E780 Pure hypercholesterolemia: Secondary | ICD-10-CM | POA: Insufficient documentation

## 2015-07-18 DIAGNOSIS — M8580 Other specified disorders of bone density and structure, unspecified site: Secondary | ICD-10-CM | POA: Insufficient documentation

## 2015-07-18 DIAGNOSIS — R000 Tachycardia, unspecified: Secondary | ICD-10-CM | POA: Diagnosis present

## 2015-07-18 DIAGNOSIS — Z79899 Other long term (current) drug therapy: Secondary | ICD-10-CM | POA: Diagnosis not present

## 2015-07-18 DIAGNOSIS — R1030 Lower abdominal pain, unspecified: Secondary | ICD-10-CM | POA: Diagnosis not present

## 2015-07-18 DIAGNOSIS — Z794 Long term (current) use of insulin: Secondary | ICD-10-CM | POA: Diagnosis not present

## 2015-07-18 DIAGNOSIS — G8929 Other chronic pain: Secondary | ICD-10-CM | POA: Insufficient documentation

## 2015-07-18 DIAGNOSIS — F419 Anxiety disorder, unspecified: Secondary | ICD-10-CM | POA: Insufficient documentation

## 2015-07-18 DIAGNOSIS — I951 Orthostatic hypotension: Secondary | ICD-10-CM | POA: Diagnosis not present

## 2015-07-18 DIAGNOSIS — I517 Cardiomegaly: Secondary | ICD-10-CM | POA: Diagnosis not present

## 2015-07-18 DIAGNOSIS — Z885 Allergy status to narcotic agent status: Secondary | ICD-10-CM | POA: Insufficient documentation

## 2015-07-18 DIAGNOSIS — E785 Hyperlipidemia, unspecified: Secondary | ICD-10-CM | POA: Diagnosis not present

## 2015-07-18 DIAGNOSIS — I10 Essential (primary) hypertension: Secondary | ICD-10-CM | POA: Insufficient documentation

## 2015-07-18 DIAGNOSIS — I5030 Unspecified diastolic (congestive) heart failure: Secondary | ICD-10-CM | POA: Diagnosis not present

## 2015-07-18 DIAGNOSIS — I471 Supraventricular tachycardia: Secondary | ICD-10-CM | POA: Diagnosis not present

## 2015-07-18 DIAGNOSIS — Z9861 Coronary angioplasty status: Secondary | ICD-10-CM

## 2015-07-18 DIAGNOSIS — R102 Pelvic and perineal pain: Secondary | ICD-10-CM | POA: Insufficient documentation

## 2015-07-18 DIAGNOSIS — I2510 Atherosclerotic heart disease of native coronary artery without angina pectoris: Secondary | ICD-10-CM | POA: Diagnosis not present

## 2015-07-18 DIAGNOSIS — M1990 Unspecified osteoarthritis, unspecified site: Secondary | ICD-10-CM | POA: Diagnosis not present

## 2015-07-18 DIAGNOSIS — I5032 Chronic diastolic (congestive) heart failure: Secondary | ICD-10-CM

## 2015-07-18 HISTORY — DX: Type 2 diabetes mellitus without complications: E11.9

## 2015-07-18 LAB — BASIC METABOLIC PANEL
ANION GAP: 13 (ref 5–15)
BUN: 11 mg/dL (ref 6–20)
CHLORIDE: 101 mmol/L (ref 101–111)
CO2: 24 mmol/L (ref 22–32)
Calcium: 9.1 mg/dL (ref 8.9–10.3)
Creatinine, Ser: 0.69 mg/dL (ref 0.44–1.00)
GFR calc Af Amer: >60 mL/min (ref >60)
GFR calc non Af Amer: >60 mL/min (ref >60)
Glucose, Bld: 113 mg/dL — ABNORMAL HIGH (ref 65–99)
POTASSIUM: 3.8 mmol/L (ref 3.5–5.1)
SODIUM: 138 mmol/L (ref 135–145)

## 2015-07-18 LAB — CBC WITH DIFFERENTIAL/PLATELET
Basophils Absolute: 0 10*3/uL (ref 0.0–0.1)
Basophils Relative: 0 % (ref 0–1)
EOS ABS: 0.1 10*3/uL (ref 0.0–0.7)
Eosinophils Relative: 1 % (ref 0–5)
HCT: 43 % (ref 36.0–46.0)
HEMOGLOBIN: 14.4 g/dL (ref 12.0–15.0)
LYMPHS ABS: 1.6 10*3/uL (ref 0.7–4.0)
LYMPHS PCT: 20 % (ref 12–46)
MCH: 30.5 pg (ref 26.0–34.0)
MCHC: 33.5 g/dL (ref 30.0–36.0)
MCV: 91.1 fL (ref 78.0–100.0)
Monocytes Absolute: 0.7 10*3/uL (ref 0.1–1.0)
Monocytes Relative: 8 % (ref 3–12)
NEUTROS PCT: 71 % (ref 43–77)
Neutro Abs: 5.6 10*3/uL (ref 1.7–7.7)
Platelets: 176 10*3/uL (ref 150–400)
RBC: 4.72 MIL/uL (ref 3.87–5.11)
RDW: 12.9 % (ref 11.5–15.5)
WBC: 8 10*3/uL (ref 4.0–10.5)

## 2015-07-18 LAB — TROPONIN I: Troponin I: <0.03 ng/mL (ref <0.031)

## 2015-07-18 LAB — CBG MONITORING, ED: Glucose-Capillary: 93 mg/dL (ref 65–99)

## 2015-07-18 LAB — CK: CK TOTAL: 44 U/L (ref 38–234)

## 2015-07-18 MED ORDER — METFORMIN HCL 500 MG PO TABS
1000.0000 mg | ORAL_TABLET | Freq: Two times a day (BID) | ORAL | Status: DC
  Filled 2015-07-18 (×2): qty 2

## 2015-07-18 MED ORDER — TRAMADOL-ACETAMINOPHEN 37.5-325 MG PO TABS
1.0000 | ORAL_TABLET | Freq: Once | ORAL | Status: AC
  Administered 2015-07-18: 1 via ORAL

## 2015-07-18 MED ORDER — NITROGLYCERIN 0.4 MG SL SUBL: 0.4000 mg | SUBLINGUAL_TABLET | SUBLINGUAL | Status: DC | PRN

## 2015-07-18 MED ORDER — VITAMIN D3 25 MCG (1000 UNIT) PO TABS
2000.0000 [IU] | ORAL_TABLET | Freq: Every day | ORAL | Status: DC
  Administered 2015-07-19 – 2015-07-23 (×5): 2000 [IU] via ORAL
  Filled 2015-07-18 (×7): qty 2

## 2015-07-18 MED ORDER — SODIUM CHLORIDE 0.9 % IJ SOLN
3.0000 mL | Freq: Two times a day (BID) | INTRAMUSCULAR | Status: DC
  Administered 2015-07-19 – 2015-07-23 (×9): 3 mL via INTRAVENOUS

## 2015-07-18 MED ORDER — ALUM & MAG HYDROXIDE-SIMETH 200-200-20 MG/5ML PO SUSP: 30.0000 mL | Freq: Four times a day (QID) | ORAL | Status: DC | PRN

## 2015-07-18 MED ORDER — VITAMIN D3 25 MCG (1000 UT) PO CAPS: 2000.0000 [IU] | ORAL_CAPSULE | Freq: Every morning | ORAL | Status: DC

## 2015-07-18 MED ORDER — INSULIN GLARGINE 100 UNIT/ML ~~LOC~~ SOLN
5.0000 [IU] | Freq: Two times a day (BID) | SUBCUTANEOUS | Status: DC
  Administered 2015-07-19 – 2015-07-20 (×4): 5 [IU] via SUBCUTANEOUS
  Filled 2015-07-18 (×5): qty 0.05

## 2015-07-18 MED ORDER — ONDANSETRON HCL 4 MG PO TABS
4.0000 mg | ORAL_TABLET | Freq: Four times a day (QID) | ORAL | Status: DC | PRN
  Administered 2015-07-18: 4 mg via ORAL
  Filled 2015-07-18: qty 1

## 2015-07-18 MED ORDER — ACETAMINOPHEN 325 MG PO TABS
650.0000 mg | ORAL_TABLET | Freq: Four times a day (QID) | ORAL | Status: DC | PRN
  Administered 2015-07-21 – 2015-07-22 (×2): 650 mg via ORAL
  Filled 2015-07-18 (×2): qty 2

## 2015-07-18 MED ORDER — SIMVASTATIN 40 MG PO TABS
40.0000 mg | ORAL_TABLET | Freq: Every day | ORAL | Status: DC
  Administered 2015-07-19 – 2015-07-22 (×5): 40 mg via ORAL
  Filled 2015-07-18 (×6): qty 1

## 2015-07-18 MED ORDER — METOPROLOL TARTRATE 25 MG PO TABS
25.0000 mg | ORAL_TABLET | Freq: Two times a day (BID) | ORAL | Status: DC
  Administered 2015-07-19 – 2015-07-20 (×3): 25 mg via ORAL
  Filled 2015-07-18 (×7): qty 1

## 2015-07-18 MED ORDER — INSULIN ASPART 100 UNIT/ML ~~LOC~~ SOLN
0.0000 [IU] | Freq: Three times a day (TID) | SUBCUTANEOUS | Status: DC
  Administered 2015-07-19: 1 [IU] via SUBCUTANEOUS
  Administered 2015-07-19 – 2015-07-21 (×3): 2 [IU] via SUBCUTANEOUS
  Administered 2015-07-22: 1 [IU] via SUBCUTANEOUS
  Administered 2015-07-22 (×2): 2 [IU] via SUBCUTANEOUS
  Administered 2015-07-23: 1 [IU] via SUBCUTANEOUS
  Administered 2015-07-23: 2 [IU] via SUBCUTANEOUS

## 2015-07-18 MED ORDER — FUROSEMIDE 40 MG PO TABS
40.0000 mg | ORAL_TABLET | Freq: Every morning | ORAL | Status: DC
  Administered 2015-07-19 – 2015-07-20 (×2): 40 mg via ORAL
  Filled 2015-07-18 (×2): qty 1

## 2015-07-18 MED ORDER — DOCUSATE SODIUM 100 MG PO CAPS
100.0000 mg | ORAL_CAPSULE | Freq: Every day | ORAL | Status: DC | PRN
  Filled 2015-07-18: qty 1

## 2015-07-18 MED ORDER — ONDANSETRON HCL 4 MG/2ML IJ SOLN: 4.0000 mg | Freq: Four times a day (QID) | INTRAMUSCULAR | Status: DC | PRN

## 2015-07-18 MED ORDER — ADULT MULTIVITAMIN W/MINERALS CH
1.0000 | ORAL_TABLET | Freq: Every morning | ORAL | Status: DC
  Administered 2015-07-19 – 2015-07-23 (×5): 1 via ORAL
  Filled 2015-07-18 (×6): qty 1

## 2015-07-18 MED ORDER — ACETAMINOPHEN 650 MG RE SUPP: 650.0000 mg | Freq: Four times a day (QID) | RECTAL | Status: DC | PRN

## 2015-07-18 MED ORDER — ENOXAPARIN SODIUM 40 MG/0.4ML ~~LOC~~ SOLN
40.0000 mg | SUBCUTANEOUS | Status: DC
  Administered 2015-07-19 – 2015-07-22 (×5): 40 mg via SUBCUTANEOUS
  Filled 2015-07-18 (×6): qty 0.4

## 2015-07-18 MED ORDER — ONDANSETRON 4 MG PO TBDP: 4.0000 mg | ORAL_TABLET | Freq: Three times a day (TID) | ORAL | Status: DC | PRN

## 2015-07-18 MED ORDER — INSULIN ASPART 100 UNIT/ML ~~LOC~~ SOLN: 0.0000 [IU] | Freq: Every day | SUBCUTANEOUS | Status: DC

## 2015-07-18 MED ORDER — RAMIPRIL 10 MG PO CAPS
10.0000 mg | ORAL_CAPSULE | Freq: Every day | ORAL | Status: DC
  Administered 2015-07-19: 10 mg via ORAL
  Filled 2015-07-18 (×4): qty 1

## 2015-07-18 NOTE — ED Notes (Signed)
Pt reports she fell onto laminate flooring at 0400 today.  Friend came at 1000 and for her. Denies injury, was unable to get up.  Recent back surgery.  Came in to get checked out.

## 2015-07-18 NOTE — ED Notes (Signed)
Patient could not stand stated that she was about throw up

## 2015-07-18 NOTE — ED Notes (Signed)
Pt has been experiencing short runs of SVT on monitor.  Dr Wyvonnia Dusky notified.

## 2015-07-18 NOTE — ED Provider Notes (Signed)
I have taken the patient at sign out for Dr. Wyvonnia Dusky. Patient's radiologic studies do not show new or acute fracture. The patient remains in stable condition. She is alert and appropriate. She has no respiratory distress. Patient denies that her pain is significantly worsened and feels that most of her pain is in her thoracic back in the area of her prior kyphoplasty and also in her sacroiliac region. She denies any associated neurologic dysfunction. The patient will be admitted for syncope. Rhythm strip identifies an episode of SVT in the emergency department. Patient does have history of SVT. At this point I do suspect dysrhythmia as the inciting event for her syncopal episode. She does not have chest pain or mental status change. Consultation has been made to [facility] hospitalist for admission.  Charlesetta Shanks, MD 07/18/15 [phone]

## 2015-07-18 NOTE — ED Notes (Signed)
Went in to do patient ortho stats vitals signs pt stated for me to come back later patient was on the phone

## 2015-07-18 NOTE — ED Notes (Signed)
Report has been attempted X2. Danielle RN attempted report the first time at 1905

## 2015-07-18 NOTE — H&P (Signed)
[facility] History and Physical  [name] YTK:[id number] DOB: 26-May-1946 DOA: 07/18/2015  Referring physician: ED physician PCP: Shirline Frees, MD   Chief Complaint: syncope  HPI:  [name] is a 69yo woman with PMH of DM2, HTN, HLD, CAD s/p MI, h/o SVT on beta blocker, arthritis who presents after syncope and a fall.  Incidentally, she had kyphoplasty of T11 2 days ago.  [name] reports that she was walking to the refrigerator today and she developed a few minutes of lightheadedness and then passed out.  She reports that she did not completely lose consciousness, but did have to lie on the floor for upwards of 6 hours due to not being able to get to the phone.  She denies seizure like activity, loss of bladder or bowel.  She reports that she thinks it was due to her sugar being low.  She said that it was 70 in the ED and that is very low for her.  She specifically denies palpitations, SOB, chest pain, headache.    In the ED, she was found to be intermittently tachycardic.  She has a history of SVT and is on metoprolol which she reports taking this morning.  Her lab work was mostly normal.  First troponin was negative.  EKG not done. CXR done showed minimal left pleural  Effusion which was unchanged from previous.  Xrays of spine and pelvis showed only osteopenia and the recent change to T11 after kyphoplasty.  CT head was negative for any acute change.  She was not complaining of pain when I saw her.    Assessment and Plan: Syncope and collapse - Unclear cause, but differential would include vasovagal syncope and arrhythmia, No seizure like activity or change in neuro status - Admit for Telemetry overnight - AM EKG - Continue home metoprolol - ACS rule out, troponin X 2 negative - Patient will be getting life alert necklace - CK 44, as she was out on the floor - Xrays and CT head were reassuring    SVT (supraventricular tachycardia) - Seen by cardiology on 8/11 for  cardiac clearance. She was having sinus tachycardia at that time and was started on beta blocker.  Dr. Percival Spanish at the time ascribed the SVT to pain.  - Will monitor on tele - Check EKG in the AM    Coronary atherosclerosis of native coronary artery - No chest pain or active symptoms - Troponin X 2 negative - Continue lasix, metoprolol, ramipril, statin   HTN (hypertension) - BP elevated this evening, she is due for her evening medications - Continue metoprolol, ramipril    Type 2 diabetes mellitus without complication - Renal function stable, continue metformin and lantus with SSI - Holding actos as the patient noted that her BS was low today - She may no longer require as tight of control of her BS, last A1C in December 2015 was 6.2    Diastolic congestive heart failure - TTE from 2014 with grade 1, chronic LE edema - Continue lasix - Stable  HLD - Continue simvastatin     Radiological Exams on Admission: Dg Chest 1 View  07/18/2015   CLINICAL DATA:  Status post fall today.  Right hip and back pain.  EXAM: CHEST  1 VIEW  COMPARISON:  June 28, 2015, November 14, 2014  FINDINGS: The heart size and mediastinal contours are within normal limits. There is minimal left pleural effusion versus pleural thickening unchanged compared to prior chest x-ray of July 2016.  There is no focal pneumonia or pulmonary edema. Patient status post prior vertebroplasty of thoracic vertebral bodies.  IMPRESSION: Minimal left pleural effusion versus pleural thickening unchanged compared to prior chest x-ray of July 2016. Otherwise no acute abnormality identified.   Electronically Signed   By: Abelardo Diesel M.D.   On: 07/18/2015 16:52   Dg Thoracic Spine 2 View  07/18/2015   CLINICAL DATA:  69 year old female with a history of thoracic pain.  EXAM: THORACIC SPINE 2 VIEWS  COMPARISON:  MRI 06/30/2015  FINDINGS: Thoracic Spine:  Thoracic vertebral elements maintain normal anatomic alignment, with no evidence of  subluxation.  Alignment of the thoracic vertebral elements similar to that of the prior MRI, with accentuated kyphotic curvature.  Interval changes of T11 vertebral augmentation.  Similar appearance of T8 vertebral augmentation, present on prior MRI.  Re- demonstration of T7 wedge compression fracture, not significantly changed from the comparison MRI.  No fracture line identified.  Diffuse osteopenia.  Changes of degenerative disc disease.  No acute fracture line identified.  IMPRESSION: Negative for acute fracture or malalignment of the thoracic spine, with unchanged configuration of vertebral bodies, including known T7 wedge compression fracture.  Interval changes of T11 vertebral augmentation, with similar appearance of T8 vertebral augmentation.  Osteopenia.  Signed,  Dulcy Fanny. Earleen Newport, DO  [facility]  [facility]   Electronically Signed   By: Corrie Mckusick D.O.   On: 07/18/2015 16:51   Dg Lumbar Spine Complete  07/18/2015   CLINICAL DATA:  69 year old female with a history of lumbar back pain  EXAM: LUMBAR SPINE - COMPLETE 4+ VIEW  COMPARISON:  CT 06/28/2015  FINDINGS: Lumbar Spine:  Lumbar vertebral elements maintain normal alignment without evidence of subluxation.  No fracture line identified. Surgical changes of prior T11 vertebral augmentation, new from the comparison CT dated 06/28/2015.  Vertebral body heights maintained, unchanged from comparison CT.  Osteopenia.  Multilevel facet changes, most pronounced at the L4-L5 and L5-S1 level.  Oblique images demonstrate no evidence of displaced pars defect.  Atherosclerosis.  Unremarkable appearance of the visualized abdomen.  IMPRESSION: No acute fracture or malalignment of the lumbar spine.  Interval changes of T11 vertebral augmentation.  Osteopenia.  Signed,  Dulcy Fanny. Earleen Newport, DO  [facility]  [facility]   Electronically Signed   By: Corrie Mckusick D.O.   On:  07/18/2015 16:48   Dg Pelvis 1-2 Views  07/18/2015   CLINICAL DATA:  Right sided pelvic pain and right posterior hip pain after a fall at home from a standing height on to a lamina for at 0400 hr earlier today. Patient was unable to get up on her own from the floor. Initial encounter. Prior left femoral neck fracture in December, 2015.  EXAM: PELVIS - 1-2 VIEW  COMPARISON:  AP pelvis x-ray 12/18/1,015. Bone window images from CT pelvis 06/28/2015.  FINDINGS: No evidence of acute fracture. Both hip joints intact with well preserved joint spaces for age. Generalized osseous demineralization. Sacroiliac joints and symphysis pubis intact. Prior ORIF of a left femoral neck fracture with as yet incomplete healing.  IMPRESSION: 1. No acute osseous abnormality. 2. Osseous demineralization. 3. ORIF of a prior left femoral neck fracture with incomplete healing.   Electronically Signed   By: Evangeline Dakin M.D.   On: 07/18/2015 16:52   Ct Head Wo Contrast  07/18/2015   CLINICAL DATA:  Status post fall today hit the back of the head. The patient  states she is dizzy.  EXAM: CT HEAD WITHOUT CONTRAST  TECHNIQUE: Contiguous axial images were obtained from the base of the skull through the vertex without intravenous contrast.  COMPARISON:  November 12, 2013  FINDINGS: There is chronic diffuse atrophy. Chronic bilateral periventricular white matter small vessel ischemic changes identified. There is no midline shift, hydrocephalus. The previously noted sub cm fatty mass dorsal to the tectum is unchanged. No acute hemorrhage or acute transcortical infarct is identified. The bony calvarium is intact. The visualized sinuses are clear.  IMPRESSION: No focal acute intracranial abnormality identified.   Electronically Signed   By: Abelardo Diesel M.D.   On: 07/18/2015 16:47   Code Status: Full Family Communication: Pt at bedside Disposition Plan: Admit for further evaluation    Gilles Chiquito, MD [phone]  Review of Systems:   Constitutional: Negative for fever, chills and malaise/fatigue. Negative for diaphoresis.  HENT: Negative for hearing loss, ear pain Eyes: Negative for blurred vision, double vision Respiratory: Negative for cough, hemoptysis, sputum production, shortness of breath Cardiovascular: Negative for chest pain, palpitations and leg swelling.  Gastrointestinal: Negative for nausea, vomiting and abdominal pain, blood loss Genitourinary: Negative for dysuria, urgency, frequency, hematuria and flank pain.  Musculoskeletal: + for fall, + for back pain Negative for myalgias, joint pain.  Skin: Negative for itching and rash.  Neurological: + for lightheadedness, generalized weakness. Negative for tingling, tremors, sensory change, speech change, focal weakness and headaches.      Past Medical History  Diagnosis Date  . Diabetes mellitus   . Hypertension   . Hypercholesteremia   . CAD (coronary artery disease)     a. NSTEMI in setting of UTI and SVT 12/2012 => LHC 01/08/13: Proximal LAD 30%, ostial diagonal 30-40%, proximal RCA 95%, inferior AK, EF 45-50% => PCI: Promus DES x 2 to RCA;  b. Echo 01/05/13: EF 27-03%, grade 1 diastolic dysfunction, MAC   . SVT (supraventricular tachycardia)   . Rectal ulceration     colonoscopy 12/2012  . Anxiety     Due to current back pain and has to lie flat.  . Arthritis   . History of blood transfusion   . Myocardial infarction 2014    Past Surgical History  Procedure Laterality Date  . Eye surgery      laser , cat bil  . Tubal ligation    . Kyphoplasty  12/27/2012    Procedure: KYPHOPLASTY;  Surgeon: Sinclair Ship, MD;  Location: [place];  Service: Orthopedics;  Laterality: Bilateral;  T-8 kyphoplasty  . Colonoscopy Left 01/12/2013    Procedure: COLONOSCOPY;  Surgeon: Wonda Horner, MD;  Location: [facility] ENDOSCOPY;  Service: Endoscopy;  Laterality: Left;  . Colonoscopy N/A 01/14/2013    Procedure: COLONOSCOPY;  Surgeon: Wonda Horner, MD;  Location: [facility]  ENDOSCOPY;  Service: Endoscopy;  Laterality: N/A;  Rm 2034, attemped Colon on 2-15=inadequate prep  . Coronary angioplasty  12/2012  . Left heart catheterization with coronary angiogram N/A 01/08/2013    Procedure: LEFT HEART CATHETERIZATION WITH CORONARY ANGIOGRAM;  Surgeon: Thayer Headings, MD;  Location: [facility] CATH LAB;  Service: Cardiovascular;  Laterality: N/A;  . Percutaneous stent intervention N/A 01/15/2013    Procedure: PERCUTANEOUS STENT INTERVENTION;  Surgeon: Peter M Martinique, MD;  Location: [facility] CATH LAB;  Service: Cardiovascular;  Laterality: N/A;  . Orif hip fracture Left 11/15/2014    Procedure: OPEN REDUCTION INTERNAL FIXATION HIP INTERTROCH;  Surgeon: Mcarthur Rossetti, MD;  Location: [place];  Service: Orthopedics;  Laterality: Left;  Marland Kitchen Kyphoplasty N/A 07/15/2015    Procedure: T11 KYPHOPLASTY;  Surgeon: Phylliss Bob, MD;  Location: [place];  Service: Orthopedics;  Laterality: N/A;  T11 kyphoplasty    Social History:  reports that she has never smoked. She has never used smokeless tobacco. She reports that she does not drink alcohol or use illicit drugs.  Confirmed with patient.   Allergies  Allergen Reactions  . Oxycodone Nausea Only    Family History  Problem Relation Age of Onset  . Hypertension Brother     Prior to Admission medications   Medication Sig Start Date End Date Taking? Authorizing Provider  Cholecalciferol (VITAMIN D3) 1000 UNITS CAPS Take 2,000 Units by mouth every morning.    Yes Historical Provider, MD  docusate sodium (COLACE) 100 MG capsule Take 1 capsule (100 mg total) by mouth daily as needed for mild constipation. 06/22/15  Yes Pamella Pert, MD  furosemide (LASIX) 40 MG tablet Take 40 mg by mouth every morning.    Yes Historical Provider, MD  insulin glargine (LANTUS) 100 UNIT/ML injection Inject 0.05 mLs (5 Units total) into the skin 2 (two) times daily. 11/13/13 07/02/16 Yes Christina P Rama, MD  metFORMIN (GLUCOPHAGE) 1000 MG tablet Take 1,000 mg by  mouth 2 (two) times daily with a meal.   Yes Historical Provider, MD  metoprolol tartrate (LOPRESSOR) 25 MG tablet Take 1 tablet (25 mg total) by mouth 2 (two) times daily. 07/09/15  Yes Minus Breeding, MD  Multiple Vitamin (MULITIVITAMIN WITH MINERALS) TABS Take 1 tablet by mouth every morning.    Yes Historical Provider, MD  nitroGLYCERIN (NITROSTAT) 0.4 MG SL tablet Place 1 tablet (0.4 mg total) under the tongue every 5 (five) minutes as needed for chest pain. 04/16/13  Yes Liliane Shi, PA-C  ondansetron (ZOFRAN ODT) 4 MG disintegrating tablet Take 1 tablet (4 mg total) by mouth every 8 (eight) hours as needed for nausea or vomiting. 06/28/15  Yes Jule Ser, DO  pioglitazone (ACTOS) 15 MG tablet Take 1 tablet (15 mg total) by mouth daily. 11/18/14  Yes Kelvin Cellar, MD  ramipril (ALTACE) 10 MG capsule Take 10 mg by mouth at bedtime. 06/08/15  Yes Historical Provider, MD  simvastatin (ZOCOR) 40 MG tablet Take 40 mg by mouth at bedtime.   Yes Historical Provider, MD    Physical Exam: Filed Vitals:   07/18/15 1730 07/18/15 1745 07/18/15 1830 07/18/15 1915  BP: 126/91 140/75 130/73 151/80  Pulse: 90 86 85 95  Temp:      TempSrc:      Resp: 17 21 20 17   Height:      Weight:      SpO2: 95% 94% 92% 95%    Physical Exam  Constitutional: Elderly woman, well nourished.  No distress.  HENT: Normocephalic. Oropharynx is clear and moist.  Eyes: Conjunctivae and EOM are normal. PERRLA, no scleral icterus.  Neck: Normal ROM. Neck supple.  CVS: Initially tachycardic, resolved to the 90s while we were talking, NR, S1/S2 +, no murmurs Pulmonary: Effort and breath sounds normal, no rhonchi, wheezes, rales.  Abdominal: Soft. BS +,  no distension, tenderness Musculoskeletal:  Stable non pitting edema to bilateral legs and no tenderness.  Neuro: Alert. Normal muscle tone and strength. No cranial nerve deficit. Skin: Skin is warm and dry. No rash noted.  Psychiatric: Normal mood and affect.  Behavior, judgment, thought content normal.   Labs on Admission:  Basic Metabolic Panel:  Recent Labs Lab 07/15/15 1012 07/18/15  1514  NA 138 138  K 4.0 3.8  CL 102 101  CO2 22 24  GLUCOSE 119* 113*  BUN 17 11  CREATININE 0.84 0.69  CALCIUM 9.5 9.1   Liver Function Tests:  Recent Labs Lab 07/15/15 1012  AST 25  ALT 16  ALKPHOS 91  BILITOT 1.2  PROT 7.7  ALBUMIN 3.4*   CBC:  Recent Labs Lab 07/15/15 1012 07/18/15 1514  WBC 9.2 8.0  NEUTROABS 6.3 5.6  HGB 14.7 14.4  HCT 43.8 43.0  MCV 90.7 91.1  PLT 225 176   Cardiac Enzymes:  Recent Labs Lab 07/18/15 1514  CKTOTAL 44  TROPONINI <0.03   CBG:  Recent Labs Lab 07/15/15 1337 07/15/15 1810 07/15/15 2132 07/16/15 0823 07/18/15 1937  GLUCAP 95 91 98 89 93      If 7PM-7AM, please contact night-coverage [email or web address] Password [email or web address] 07/18/2015, 8:04 PM

## 2015-07-18 NOTE — ED Notes (Signed)
Pt reports pain 10/10, down from 35/10 prior to surgical procedure two days ago.

## 2015-07-18 NOTE — ED Provider Notes (Signed)
CSN: [id number]     Arrival date & time 07/18/15  1358 History   First MD Initiated Contact with Patient 07/18/15 1409     Chief Complaint  Patient presents with  . Fall     (Consider location/radiation/quality/duration/timing/severity/associated sxs/prior Treatment) HPI Comments: Patient presents by EMS after fall and possible syncopal episode. She states she was going to the refrigerator in the middle the night began to feel lightheaded and woozy. She wondered if her blood sugar was low. She went to the microwave to microwave some corn and some steam hit her in the face. She then fell to the ground and could not get back up. She is not certain if she lost consciousness. She laid on the floor for proximally 6 hours. She denies any nausea or vomiting. She's that she landed on her buttocks and hit her head. She does not take any blood thinners. Notably patient had a kyphoplasty at T11 by Dr. Lynann Bologna 2 days ago. She hurts at the site of her surgery in her mid back. She denies any neck pain. She denies any weakness, numbness or tingling. She denies any bowel or bladder incontinence. She denies any chest pain or shortness of breath.  The history is provided by the patient.    Past Medical History  Diagnosis Date  . Diabetes mellitus   . Hypertension   . Hypercholesteremia   . CAD (coronary artery disease)     a. NSTEMI in setting of UTI and SVT 12/2012 => LHC 01/08/13: Proximal LAD 30%, ostial diagonal 30-40%, proximal RCA 95%, inferior AK, EF 45-50% => PCI: Promus DES x 2 to RCA;  b. Echo 01/05/13: EF 95-09%, grade 1 diastolic dysfunction, MAC   . SVT (supraventricular tachycardia)   . Rectal ulceration     colonoscopy 12/2012  . Anxiety     Due to current back pain and has to lie flat.  . Arthritis   . History of blood transfusion   . Myocardial infarction 2014   Past Surgical History  Procedure Laterality Date  . Eye surgery      laser , cat bil  . Tubal ligation    . Kyphoplasty   12/27/2012    Procedure: KYPHOPLASTY;  Surgeon: Sinclair Ship, MD;  Location: [place];  Service: Orthopedics;  Laterality: Bilateral;  T-8 kyphoplasty  . Colonoscopy Left 01/12/2013    Procedure: COLONOSCOPY;  Surgeon: Wonda Horner, MD;  Location: [facility] ENDOSCOPY;  Service: Endoscopy;  Laterality: Left;  . Colonoscopy N/A 01/14/2013    Procedure: COLONOSCOPY;  Surgeon: Wonda Horner, MD;  Location: [facility] ENDOSCOPY;  Service: Endoscopy;  Laterality: N/A;  Rm 2034, attemped Colon on 2-15=inadequate prep  . Coronary angioplasty  12/2012  . Left heart catheterization with coronary angiogram N/A 01/08/2013    Procedure: LEFT HEART CATHETERIZATION WITH CORONARY ANGIOGRAM;  Surgeon: Thayer Headings, MD;  Location: [facility] CATH LAB;  Service: Cardiovascular;  Laterality: N/A;  . Percutaneous stent intervention N/A 01/15/2013    Procedure: PERCUTANEOUS STENT INTERVENTION;  Surgeon: Peter M Martinique, MD;  Location: [facility] CATH LAB;  Service: Cardiovascular;  Laterality: N/A;  . Orif hip fracture Left 11/15/2014    Procedure: OPEN REDUCTION INTERNAL FIXATION HIP INTERTROCH;  Surgeon: Mcarthur Rossetti, MD;  Location: [place];  Service: Orthopedics;  Laterality: Left;  Marland Kitchen Kyphoplasty N/A 07/15/2015    Procedure: T11 KYPHOPLASTY;  Surgeon: Phylliss Bob, MD;  Location: [place];  Service: Orthopedics;  Laterality: N/A;  T11 kyphoplasty   Family History  Problem Relation Age of Onset  . Hypertension Brother    Social History  Substance Use Topics  . Smoking status: Never Smoker   . Smokeless tobacco: Never Used  . Alcohol Use: No   OB History    No data available     Review of Systems  Constitutional: Negative for fever, activity change, appetite change and fatigue.  Respiratory: Negative for cough, chest tightness and shortness of breath.   Cardiovascular: Negative for chest pain.  Gastrointestinal: Negative for nausea, vomiting and abdominal pain.  Genitourinary: Negative for dysuria, hematuria, vaginal  bleeding and vaginal discharge.  Musculoskeletal: Positive for myalgias, back pain and arthralgias. Negative for neck pain and neck stiffness.  Skin: Negative for wound.  Neurological: Positive for dizziness, weakness, light-headedness and headaches.  A complete 10 system review of systems was obtained and all systems are negative except as noted in the HPI and PMH.      Allergies  Oxycodone  Home Medications   Prior to Admission medications   Medication Sig Start Date End Date Taking? Authorizing Provider  Cholecalciferol (VITAMIN D3) 1000 UNITS CAPS Take 2,000 Units by mouth every morning.     Historical Provider, MD  docusate sodium (COLACE) 100 MG capsule Take 1 capsule (100 mg total) by mouth daily as needed for mild constipation. 06/22/15   Pamella Pert, MD  furosemide (LASIX) 40 MG tablet Take 40 mg by mouth every morning.     Historical Provider, MD  insulin glargine (LANTUS) 100 UNIT/ML injection Inject 0.05 mLs (5 Units total) into the skin 2 (two) times daily. Patient taking differently: Inject 20 Units into the skin daily.  11/13/13 07/02/16  Venetia Maxon Rama, MD  metFORMIN (GLUCOPHAGE) 1000 MG tablet Take 1,000 mg by mouth 2 (two) times daily with a meal.    Historical Provider, MD  metoprolol tartrate (LOPRESSOR) 25 MG tablet Take 1 tablet (25 mg total) by mouth 2 (two) times daily. 07/09/15   Minus Breeding, MD  Multiple Vitamin (MULITIVITAMIN WITH MINERALS) TABS Take 1 tablet by mouth every morning.     Historical Provider, MD  nitroGLYCERIN (NITROSTAT) 0.4 MG SL tablet Place 1 tablet (0.4 mg total) under the tongue every 5 (five) minutes as needed for chest pain. 04/16/13   Liliane Shi, PA-C  ondansetron (ZOFRAN ODT) 4 MG disintegrating tablet Take 1 tablet (4 mg total) by mouth every 8 (eight) hours as needed for nausea or vomiting. 06/28/15   Jule Ser, DO  pioglitazone (ACTOS) 15 MG tablet Take 1 tablet (15 mg total) by mouth daily. 11/18/14   Kelvin Cellar, MD   ramipril (ALTACE) 10 MG capsule Take 10 mg by mouth at bedtime. 06/08/15   Historical Provider, MD  simvastatin (ZOCOR) 40 MG tablet Take 40 mg by mouth at bedtime.    Historical Provider, MD   BP 136/69 mmHg  Temp(Src) 98.2 F (36.8 C) (Oral)  Resp 18  Ht 5\' 8"  (1.727 m)  Wt 180 lb (81.647 kg)  BMI 27.38 kg/m2  SpO2 97% Physical Exam  Constitutional: She is oriented to person, place, and time. She appears well-developed and well-nourished. No distress.  HENT:  Head: Normocephalic and atraumatic.  Mouth/Throat: Oropharynx is clear and moist. No oropharyngeal exudate.  Eyes: Conjunctivae and EOM are normal. Pupils are equal, round, and reactive to light.  Neck: Normal range of motion. Neck supple.  No C spine tenderness  Cardiovascular: Normal rate, regular rhythm, normal heart sounds and intact distal pulses.   No murmur heard.  Pulmonary/Chest: Effort normal and breath sounds normal. No respiratory distress. She exhibits no tenderness.  Abdominal: Soft. There is no tenderness. There is no rebound and no guarding.  Musculoskeletal: Normal range of motion. She exhibits no edema or tenderness.  surigical incisions to lower thoracic spine.  Diffuse lumbar tenderness without stepoff. FROM hips without pain.  Neurological: She is alert and oriented to person, place, and time. No cranial nerve deficit. She exhibits normal muscle tone. Coordination normal.  No ataxia on finger to nose bilaterally. No pronator drift. 5/5 strength throughout. CN 2-12 intact. Equal grip strength. Sensation intact.  Skin: Skin is warm.  Psychiatric: She has a normal mood and affect. Her behavior is normal.  Nursing note and vitals reviewed.   ED Course  Procedures (including critical care time) Labs Review Labs Reviewed  CBC WITH DIFFERENTIAL/PLATELET  BASIC METABOLIC PANEL  CK  TROPONIN I  URINALYSIS, ROUTINE W REFLEX MICROSCOPIC (NOT AT [facility])    Imaging Review No results found. I have personally  reviewed and evaluated these images and lab results as part of my medical decision-making.   EKG Interpretation None      MDM   Final diagnoses:  Fall  fall with possible syncopal episode.  Recent kyphoplasty.  Unable to get up.  No focal weakness.  Nonfocal neurological exam. No chest pain or shortness of breath. EKG normal sinus rhythm.  Nursing notes that patient has persistent intermittent episodes of tachycardia as high as 180 that lasts for a few seconds and then resolved. Chart review shows history of SVT. Question whether this arrhythmia was related to her syncopal episode.  X-rays will be obtained for her areas of pain. Basic labs are pending including a CK  Care will be transferred to Dr. Vallery Ridge at shift change. Anticipate admission for probable syncopal episode in setting of tachycardia arrhythmia.   ED ECG REPORT   Date: 07/18/2015  Rate: 91  Rhythm: normal sinus rhythm  QRS Axis: normal  Intervals: normal  ST/T Wave abnormalities: normal  Conduction Disutrbances:none  Narrative Interpretation:   Old EKG Reviewed: unchanged  I have personally reviewed the EKG tracing and agree with the computerized printout as noted.    Ezequiel Essex, MD 07/18/15 [phone]

## 2015-07-19 ENCOUNTER — Other Ambulatory Visit: Payer: Self-pay

## 2015-07-19 DIAGNOSIS — I951 Orthostatic hypotension: Secondary | ICD-10-CM | POA: Diagnosis not present

## 2015-07-19 DIAGNOSIS — I10 Essential (primary) hypertension: Secondary | ICD-10-CM | POA: Diagnosis not present

## 2015-07-19 DIAGNOSIS — I2510 Atherosclerotic heart disease of native coronary artery without angina pectoris: Secondary | ICD-10-CM | POA: Diagnosis not present

## 2015-07-19 DIAGNOSIS — R55 Syncope and collapse: Secondary | ICD-10-CM | POA: Diagnosis not present

## 2015-07-19 DIAGNOSIS — I471 Supraventricular tachycardia: Secondary | ICD-10-CM | POA: Diagnosis not present

## 2015-07-19 LAB — GLUCOSE, CAPILLARY
GLUCOSE-CAPILLARY: 106 mg/dL — AB (ref 65–99)
GLUCOSE-CAPILLARY: 135 mg/dL — AB (ref 65–99)
GLUCOSE-CAPILLARY: 154 mg/dL — AB (ref 65–99)
Glucose-Capillary: 119 mg/dL — ABNORMAL HIGH (ref 65–99)
Glucose-Capillary: 112 mg/dL — ABNORMAL HIGH (ref 65–99)

## 2015-07-19 LAB — BASIC METABOLIC PANEL
ANION GAP: 9 (ref 5–15)
BUN: 9 mg/dL (ref 6–20)
CHLORIDE: 103 mmol/L (ref 101–111)
CO2: 28 mmol/L (ref 22–32)
Calcium: 8.9 mg/dL (ref 8.9–10.3)
Creatinine, Ser: 0.67 mg/dL (ref 0.44–1.00)
GFR calc Af Amer: >60 mL/min (ref >60)
GLUCOSE: 114 mg/dL — AB (ref 65–99)
POTASSIUM: 3.5 mmol/L (ref 3.5–5.1)
Sodium: 140 mmol/L (ref 135–145)

## 2015-07-19 LAB — CBC
HEMATOCRIT: 38.4 % (ref 36.0–46.0)
HEMOGLOBIN: 12.8 g/dL (ref 12.0–15.0)
MCH: 30.2 pg (ref 26.0–34.0)
MCHC: 33.3 g/dL (ref 30.0–36.0)
MCV: 90.6 fL (ref 78.0–100.0)
Platelets: 175 10*3/uL (ref 150–400)
RBC: 4.24 MIL/uL (ref 3.87–5.11)
RDW: 13.1 % (ref 11.5–15.5)
WBC: 6.5 10*3/uL (ref 4.0–10.5)

## 2015-07-19 LAB — MAGNESIUM: Magnesium: 1.3 mg/dL — ABNORMAL LOW (ref 1.7–2.4)

## 2015-07-19 LAB — TROPONIN I: Troponin I: <0.03 ng/mL (ref <0.031)

## 2015-07-19 MED ORDER — MAGNESIUM SULFATE 2 GM/50ML IV SOLN
2.0000 g | Freq: Once | INTRAVENOUS | Status: AC
  Administered 2015-07-19: 2 g via INTRAVENOUS
  Filled 2015-07-19 (×2): qty 50

## 2015-07-19 NOTE — Progress Notes (Addendum)
[facility] PROGRESS NOTE  [name] ERX:[id number] DOB: [birth year] DOA: 07/18/2015 PCP: Shirline Frees, MD  Assessment/Plan: Syncope and collapse - Unclear cause, but differential would include vasovagal syncope and arrhythmia.  - Continue home metoprolol - ACS rule out, troponin X 3 negative - Patient will be getting life alert necklace - CK 44, as she was out on the floor - Xrays and CT head were reassuring - will observed on telemetry. Check ECHO. Check mg level.  -might need Holter.  -recently started on pain medications.  -needs PT evaluation. CM, SW .  SVT (supraventricular tachycardia) - Seen by cardiology on 8/11 for cardiac clearance. She was having sinus tachycardia at that time and was started on beta blocker. Dr. Percival Spanish at the time ascribed the SVT to pain.  - Will monitor on tele - check ECHO. Mg level.    Coronary atherosclerosis of native coronary artery - No chest pain or active symptoms - Troponin X 2 negative - Continue lasix, metoprolol, ramipril, statin   HTN (hypertension) - BP elevated this evening, she is due for her evening medications - Continue metoprolol, ramipril   Type 2 diabetes mellitus without complication - Renal function stable, continue lantus with SSI - Holding actos as the patient noted that her BS was low today. Hold metformin while inpatient.  - She may no longer require as tight of control of her BS, last A1C in December 2015 was 6.2   Diastolic congestive heart failure - TTE from 2014 with grade 1, chronic LE edema - Continue lasix - Stable  HLD - Continue simvastatin    Code Status: full code.  Family Communication: care discussed with patient.  Disposition Plan: Remain for observation and further work up of syncope.    Consultants:  none  Procedures:  ECHO ordered.   Antibiotics:  none  HPI/Subjective: Feeling well today, she didn't completely pass out.  She felt lightheaded.    Objective: Filed Vitals:   07/19/15 0130  BP: 161/87  Pulse: 86  Temp: 97.8 F (36.6 C)  Resp: 20    Intake/Output Summary (Last 24 hours) at 07/19/15 0845 Last data filed at 07/19/15 0615  Gross per 24 hour  Intake    480 ml  Output      0 ml  Net    480 ml   Filed Weights   07/18/15 1412 07/18/15 2035  Weight: 81.647 kg (180 lb) 80.1 kg (176 lb 9.4 oz)    Exam:   General: Alert in no distress.   Cardiovascular: S 1, S 2 RRR  Respiratory: CTA  Abdomen: BS present, soft, nt  Musculoskeletal: no edema  Data Reviewed: Basic Metabolic Panel:  Recent Labs Lab 07/15/15 1012 07/18/15 1514 07/19/15 0229  NA 138 138 140  K 4.0 3.8 3.5  CL 102 101 103  CO2 22 24 28   GLUCOSE 119* 113* 114*  BUN 17 11 9   CREATININE 0.84 0.69 0.67  CALCIUM 9.5 9.1 8.9   Liver Function Tests:  Recent Labs Lab 07/15/15 1012  AST 25  ALT 16  ALKPHOS 91  BILITOT 1.2  PROT 7.7  ALBUMIN 3.4*   No results for input(s): LIPASE, AMYLASE in the last 168 hours. No results for input(s): AMMONIA in the last 168 hours. CBC:  Recent Labs Lab 07/15/15 1012 07/18/15 1514 07/19/15 0229  WBC 9.2 8.0 6.5  NEUTROABS 6.3 5.6  --   HGB 14.7 14.4 12.8  HCT 43.8 43.0 38.4  MCV 90.7 91.1 90.6  PLT 225 176 175   Cardiac Enzymes:  Recent Labs Lab 07/18/15 1514 07/18/15 2206 07/19/15 0229  CKTOTAL 44  --   --   TROPONINI <0.03 <0.03 <0.03   BNP (last 3 results)  Recent Labs  12/05/14 0145  BNP 76.4    ProBNP (last 3 results) No results for input(s): PROBNP in the last 8760 hours.  CBG:  Recent Labs Lab 07/15/15 2132 07/16/15 0823 07/18/15 1937 07/19/15 0051 07/19/15 0614  GLUCAP 98 89 93 106* 119*    Recent Results (from the past 240 hour(s))  Surgical pcr screen     Status: None   Collection Time: 07/15/15 10:11 AM  Result Value Ref Range Status   MRSA, PCR NEGATIVE NEGATIVE Final   Staphylococcus aureus NEGATIVE NEGATIVE Final    Comment:        The  Xpert SA Assay (FDA approved for NASAL specimens in patients over 61 years of age), is one component of a comprehensive surveillance program.  Test performance has been validated by [facility] for patients greater than or equal to 59 year old. It is not intended to diagnose infection nor to guide or monitor treatment.      Studies: Dg Chest 1 View  07/18/2015   CLINICAL DATA:  Status post fall today.  Right hip and back pain.  EXAM: CHEST  1 VIEW  COMPARISON:  June 28, 2015, November 14, 2014  FINDINGS: The heart size and mediastinal contours are within normal limits. There is minimal left pleural effusion versus pleural thickening unchanged compared to prior chest x-ray of July 2016. There is no focal pneumonia or pulmonary edema. Patient status post prior vertebroplasty of thoracic vertebral bodies.  IMPRESSION: Minimal left pleural effusion versus pleural thickening unchanged compared to prior chest x-ray of July 2016. Otherwise no acute abnormality identified.   Electronically Signed   By: Abelardo Diesel M.D.   On: 07/18/2015 16:52   Dg Thoracic Spine 2 View  07/18/2015   CLINICAL DATA:  69 year old female with a history of thoracic pain.  EXAM: THORACIC SPINE 2 VIEWS  COMPARISON:  MRI 06/30/2015  FINDINGS: Thoracic Spine:  Thoracic vertebral elements maintain normal anatomic alignment, with no evidence of subluxation.  Alignment of the thoracic vertebral elements similar to that of the prior MRI, with accentuated kyphotic curvature.  Interval changes of T11 vertebral augmentation.  Similar appearance of T8 vertebral augmentation, present on prior MRI.  Re- demonstration of T7 wedge compression fracture, not significantly changed from the comparison MRI.  No fracture line identified.  Diffuse osteopenia.  Changes of degenerative disc disease.  No acute fracture line identified.  IMPRESSION: Negative for acute fracture or malalignment of the thoracic spine, with unchanged configuration of  vertebral bodies, including known T7 wedge compression fracture.  Interval changes of T11 vertebral augmentation, with similar appearance of T8 vertebral augmentation.  Osteopenia.  Signed,  Dulcy Fanny. Earleen Newport, DO  [facility]  [facility]   Electronically Signed   By: Corrie Mckusick D.O.   On: 07/18/2015 16:51   Dg Lumbar Spine Complete  07/18/2015   CLINICAL DATA:  69 year old female with a history of lumbar back pain  EXAM: LUMBAR SPINE - COMPLETE 4+ VIEW  COMPARISON:  CT 06/28/2015  FINDINGS: Lumbar Spine:  Lumbar vertebral elements maintain normal alignment without evidence of subluxation.  No fracture line identified. Surgical changes of prior T11 vertebral augmentation, new from the comparison CT dated 06/28/2015.  Vertebral body heights maintained, unchanged from  comparison CT.  Osteopenia.  Multilevel facet changes, most pronounced at the L4-L5 and L5-S1 level.  Oblique images demonstrate no evidence of displaced pars defect.  Atherosclerosis.  Unremarkable appearance of the visualized abdomen.  IMPRESSION: No acute fracture or malalignment of the lumbar spine.  Interval changes of T11 vertebral augmentation.  Osteopenia.  Signed,  Dulcy Fanny. Earleen Newport, DO  [facility]  [facility]   Electronically Signed   By: Corrie Mckusick D.O.   On: 07/18/2015 16:48   Dg Pelvis 1-2 Views  07/18/2015   CLINICAL DATA:  Right sided pelvic pain and right posterior hip pain after a fall at home from a standing height on to a lamina for at 0400 hr earlier today. Patient was unable to get up on her own from the floor. Initial encounter. Prior left femoral neck fracture in December, 2015.  EXAM: PELVIS - 1-2 VIEW  COMPARISON:  AP pelvis x-ray 12/18/1,015. Bone window images from CT pelvis 06/28/2015.  FINDINGS: No evidence of acute fracture. Both hip joints intact with well preserved joint spaces for age. Generalized osseous  demineralization. Sacroiliac joints and symphysis pubis intact. Prior ORIF of a left femoral neck fracture with as yet incomplete healing.  IMPRESSION: 1. No acute osseous abnormality. 2. Osseous demineralization. 3. ORIF of a prior left femoral neck fracture with incomplete healing.   Electronically Signed   By: Evangeline Dakin M.D.   On: 07/18/2015 16:52   Ct Head Wo Contrast  07/18/2015   CLINICAL DATA:  Status post fall today hit the back of the head. The patient states she is dizzy.  EXAM: CT HEAD WITHOUT CONTRAST  TECHNIQUE: Contiguous axial images were obtained from the base of the skull through the vertex without intravenous contrast.  COMPARISON:  November 12, 2013  FINDINGS: There is chronic diffuse atrophy. Chronic bilateral periventricular white matter small vessel ischemic changes identified. There is no midline shift, hydrocephalus. The previously noted sub cm fatty mass dorsal to the tectum is unchanged. No acute hemorrhage or acute transcortical infarct is identified. The bony calvarium is intact. The visualized sinuses are clear.  IMPRESSION: No focal acute intracranial abnormality identified.   Electronically Signed   By: Abelardo Diesel M.D.   On: 07/18/2015 16:47    Scheduled Meds: . cholecalciferol  2,000 Units Oral Daily  . enoxaparin (LOVENOX) injection  40 mg Subcutaneous Q24H  . furosemide  40 mg Oral q morning - 10a  . insulin aspart  0-5 Units Subcutaneous QHS  . insulin aspart  0-9 Units Subcutaneous TID WC  . insulin glargine  5 Units Subcutaneous BID  . metFORMIN  1,000 mg Oral BID WC  . metoprolol tartrate  25 mg Oral BID  . multivitamin with minerals  1 tablet Oral q morning - 10a  . ramipril  10 mg Oral QHS  . simvastatin  40 mg Oral QHS  . sodium chloride  3 mL Intravenous Q12H   Continuous Infusions:   Active Problems:   SVT (supraventricular tachycardia)   Coronary atherosclerosis of native coronary artery   Syncope   HTN (hypertension)   Type 2 diabetes  mellitus without complication   Diastolic congestive heart failure   Syncope and collapse    Time spent: 35 minutes.     Niel Hummer A  [facility] Pager [phone]. If 7PM-7AM, please contact night-coverage at [email or web address], password [facility] 07/19/2015, 8:45 AM

## 2015-07-19 NOTE — Progress Notes (Signed)
The patient arrived to 3E21 from the ED at 2030.  She was oriented to the room and placed on telemetry.  CCMD was notified.  She is A&O and her VS are stable.  She is an assistx2 and uses a walker when ambulating as well as the BSC.  A fall risk safety plan was gone over with the patient.  Her call bell was placed within reach and her bed alarm was turned on.

## 2015-07-19 NOTE — Progress Notes (Signed)
PT Cancellation Note  Patient Details Name: INNOCENCE SCHLOTZHAUER MRN: [id number] DOB: [birth year]   Cancelled Treatment:    Reason Eval/Treat Not Completed: Other (comment)   Ms. Greenhalgh is declining PT while her friends are visiting;   Will follow up later today as time allows;  Otherwise, will follow up for PT tomorrow;   Thank you,  Roney Marion, [place] Pager [phone] Office [phone]     Roney Marion [facility] 07/19/2015, 2:08 PM

## 2015-07-20 ENCOUNTER — Observation Stay (HOSPITAL_BASED_OUTPATIENT_CLINIC_OR_DEPARTMENT_OTHER): Payer: Medicare Other

## 2015-07-20 DIAGNOSIS — W19XXXD Unspecified fall, subsequent encounter: Secondary | ICD-10-CM | POA: Diagnosis not present

## 2015-07-20 DIAGNOSIS — R000 Tachycardia, unspecified: Secondary | ICD-10-CM | POA: Diagnosis not present

## 2015-07-20 DIAGNOSIS — R55 Syncope and collapse: Secondary | ICD-10-CM | POA: Diagnosis not present

## 2015-07-20 DIAGNOSIS — I10 Essential (primary) hypertension: Secondary | ICD-10-CM

## 2015-07-20 DIAGNOSIS — I471 Supraventricular tachycardia: Secondary | ICD-10-CM

## 2015-07-20 DIAGNOSIS — I951 Orthostatic hypotension: Secondary | ICD-10-CM

## 2015-07-20 LAB — GLUCOSE, CAPILLARY
GLUCOSE-CAPILLARY: 198 mg/dL — AB (ref 65–99)
GLUCOSE-CAPILLARY: 113 mg/dL — AB (ref 65–99)
Glucose-Capillary: 107 mg/dL — ABNORMAL HIGH (ref 65–99)
Glucose-Capillary: 117 mg/dL — ABNORMAL HIGH (ref 65–99)

## 2015-07-20 LAB — BASIC METABOLIC PANEL
ANION GAP: 9 (ref 5–15)
BUN: 11 mg/dL (ref 6–20)
CALCIUM: 9 mg/dL (ref 8.9–10.3)
CO2: 30 mmol/L (ref 22–32)
Chloride: 99 mmol/L — ABNORMAL LOW (ref 101–111)
Creatinine, Ser: 0.69 mg/dL (ref 0.44–1.00)
GFR calc Af Amer: >60 mL/min (ref >60)
GFR calc non Af Amer: >60 mL/min (ref >60)
GLUCOSE: 94 mg/dL (ref 65–99)
Potassium: 3.9 mmol/L (ref 3.5–5.1)
Sodium: 138 mmol/L (ref 135–145)

## 2015-07-20 LAB — HEMOGLOBIN A1C
Hgb A1c MFr Bld: 5.3 % (ref 4.8–5.6)
MEAN PLASMA GLUCOSE: 105 mg/dL

## 2015-07-20 MED ORDER — CARVEDILOL 3.125 MG PO TABS: 3.1250 mg | ORAL_TABLET | Freq: Two times a day (BID) | ORAL | Status: DC

## 2015-07-20 MED ORDER — SODIUM CHLORIDE 0.9 % IV SOLN
INTRAVENOUS | Status: DC
  Administered 2015-07-20 – 2015-07-21 (×3): via INTRAVENOUS

## 2015-07-20 MED ORDER — METOPROLOL TARTRATE 12.5 MG HALF TABLET
12.5000 mg | ORAL_TABLET | Freq: Two times a day (BID) | ORAL | Status: DC
  Administered 2015-07-20: 12.5 mg via ORAL
  Filled 2015-07-20 (×3): qty 1

## 2015-07-20 NOTE — Progress Notes (Signed)
[facility] PROGRESS NOTE  [name] BSW:[id number] DOB: [birth year] DOA: 07/18/2015 PCP: Shirline Frees, MD  Brief narrative 69 year old female with history of hypertension, type 2 diabetes mellitus, CAD with history of MI, history of SVT on beta blocker, arthritis, hyperlipidemia with recent T11 kyphoplasty who presented after sustaining a fall at home and was on the floor for almost 6 hours not being able to get up. She reports feeling lightheaded and slumping on the ground but denies any loss of consciousness, seizure-like activity or loss of bladder /bowel control. He reports that her blood sugar was in the 70s in the ED which is quite low for her. In the ED patient was tachycardic, normal lab work and troponin. EKG was unremarkable. Chest x-ray showed minimal left pleural effusion which was unchanged. X-ray of the spine showed recent changes to T11 after kyphoplasty without any fractures. Head CT unremarkable. Patient admitted under observation. Orthostasis positive.    Assessment/Plan: Near syncope and collapse Appears to be secondary to orthostatic hypotension.( Significantly Positive when checked this morning) Will hold Lasix and ACE inhibitor. Reduce metoprolol dose to 2.5 mg twice daily. Will start on gentle hydration. Check 2-D echo. Seen by PT and recommends SNF.  SVT Recently started on her blocker by cardiology during outpatient evaluation for cardiac clearance. Heart rate going up to 140s (possibly been ambulating to the bathroom) Replenished low magnesium level. Recheck in a.m. Check 2-D echo. Will reduce metoprolol in the setting of orthostatic hypotension.  Coronary artery disease Procedure troponin negative. Continue low-dose metoprolol and statin. Holding Lasix and ACE inhibitor.  Type 2 diabetes mellitus A1c of 5.3. I will discontinue all her medications (should not be continued upon discharge). Monitor on sliding scale insulin alone.  Diastolic  CHF Currently euvolemic. Holding Lasix.  Hyperlipidemia Continue statin  DVT prophylaxis: Subcutaneous Lovenox Diet: Heart healthy  Code Status: Full code Family Communication: None at bedside  Disposition Plan: Social work consult for possible skilled nursing facility.   Consultants:  None  Procedures:  2 d echo  Head CT  Antibiotics:  NONE  HPI/Subjective: Patient seen and examined. Was found to have heart in the range of 120-140 in the morning. Denies any palpitation or chest discomfort.  Objective: Filed Vitals:   07/20/15 0944  BP: 144/64  Pulse: 78  Temp:   Resp:     Intake/Output Summary (Last 24 hours) at 07/20/15 1312 Last data filed at 07/20/15 1150  Gross per 24 hour  Intake    900 ml  Output   1801 ml  Net   -901 ml   Filed Weights   07/18/15 1412 07/18/15 2035 07/20/15 0536  Weight: 81.647 kg (180 lb) 80.1 kg (176 lb 9.4 oz) 77.9 kg (171 lb 11.8 oz)    Exam:   General:  Elderly female in no acute distress  HEENT: No pallor, moist oral mucosa, supple neck  Chest: Clear to auscultation bilaterally  CVS: Normal S1 and S2, no murmurs rub or gallop  GI: Soft, nondistended, nontender, bowel sounds present  Musculoskeletal musculoskeletal: Warm, no edema  CNS: Alert and oriented  Data Reviewed: Basic Metabolic Panel:  Recent Labs Lab 07/15/15 1012 07/18/15 1514 07/19/15 0229 07/19/15 1015 07/20/15 0310  NA 138 138 140  --  138  K 4.0 3.8 3.5  --  3.9  CL 102 101 103  --  99*  CO2 22 24 28   --  30  GLUCOSE 119* 113* 114*  --  94  BUN 17 11  9  --  11  CREATININE 0.84 0.69 0.67  --  0.69  CALCIUM 9.5 9.1 8.9  --  9.0  MG  --   --   --  1.3*  --    Liver Function Tests:  Recent Labs Lab 07/15/15 1012  AST 25  ALT 16  ALKPHOS 91  BILITOT 1.2  PROT 7.7  ALBUMIN 3.4*   No results for input(s): LIPASE, AMYLASE in the last 168 hours. No results for input(s): AMMONIA in the last 168 hours. CBC:  Recent Labs Lab  07/15/15 1012 07/18/15 1514 07/19/15 0229  WBC 9.2 8.0 6.5  NEUTROABS 6.3 5.6  --   HGB 14.7 14.4 12.8  HCT 43.8 43.0 38.4  MCV 90.7 91.1 90.6  PLT 225 176 175   Cardiac Enzymes:  Recent Labs Lab 07/18/15 1514 07/18/15 2206 07/19/15 0229 07/19/15 1015  CKTOTAL 44  --   --   --   TROPONINI <0.03 <0.03 <0.03 <0.03   BNP (last 3 results)  Recent Labs  12/05/14 0145  BNP 76.4    ProBNP (last 3 results) No results for input(s): PROBNP in the last 8760 hours.  CBG:  Recent Labs Lab 07/19/15 1111 07/19/15 1636 07/19/15 2218 07/20/15 0557 07/20/15 1121  GLUCAP 154* 135* 112* 107* 198*    Recent Results (from the past 240 hour(s))  Surgical pcr screen     Status: None   Collection Time: 07/15/15 10:11 AM  Result Value Ref Range Status   MRSA, PCR NEGATIVE NEGATIVE Final   Staphylococcus aureus NEGATIVE NEGATIVE Final    Comment:        The Xpert SA Assay (FDA approved for NASAL specimens in patients over 75 years of age), is one component of a comprehensive surveillance program.  Test performance has been validated by [facility] for patients greater than or equal to 37 year old. It is not intended to diagnose infection nor to guide or monitor treatment.      Studies: Dg Chest 1 View  07/18/2015   CLINICAL DATA:  Status post fall today.  Right hip and back pain.  EXAM: CHEST  1 VIEW  COMPARISON:  June 28, 2015, November 14, 2014  FINDINGS: The heart size and mediastinal contours are within normal limits. There is minimal left pleural effusion versus pleural thickening unchanged compared to prior chest x-ray of July 2016. There is no focal pneumonia or pulmonary edema. Patient status post prior vertebroplasty of thoracic vertebral bodies.  IMPRESSION: Minimal left pleural effusion versus pleural thickening unchanged compared to prior chest x-ray of July 2016. Otherwise no acute abnormality identified.   Electronically Signed   By: Abelardo Diesel M.D.   On:  07/18/2015 16:52   Dg Thoracic Spine 2 View  07/18/2015   CLINICAL DATA:  69 year old female with a history of thoracic pain.  EXAM: THORACIC SPINE 2 VIEWS  COMPARISON:  MRI 06/30/2015  FINDINGS: Thoracic Spine:  Thoracic vertebral elements maintain normal anatomic alignment, with no evidence of subluxation.  Alignment of the thoracic vertebral elements similar to that of the prior MRI, with accentuated kyphotic curvature.  Interval changes of T11 vertebral augmentation.  Similar appearance of T8 vertebral augmentation, present on prior MRI.  Re- demonstration of T7 wedge compression fracture, not significantly changed from the comparison MRI.  No fracture line identified.  Diffuse osteopenia.  Changes of degenerative disc disease.  No acute fracture line identified.  IMPRESSION: Negative for acute fracture or malalignment of the thoracic spine, with  unchanged configuration of vertebral bodies, including known T7 wedge compression fracture.  Interval changes of T11 vertebral augmentation, with similar appearance of T8 vertebral augmentation.  Osteopenia.  Signed,  Dulcy Fanny. Earleen Newport, DO  [facility]  [facility]   Electronically Signed   By: Corrie Mckusick D.O.   On: 07/18/2015 16:51   Dg Lumbar Spine Complete  07/18/2015   CLINICAL DATA:  69 year old female with a history of lumbar back pain  EXAM: LUMBAR SPINE - COMPLETE 4+ VIEW  COMPARISON:  CT 06/28/2015  FINDINGS: Lumbar Spine:  Lumbar vertebral elements maintain normal alignment without evidence of subluxation.  No fracture line identified. Surgical changes of prior T11 vertebral augmentation, new from the comparison CT dated 06/28/2015.  Vertebral body heights maintained, unchanged from comparison CT.  Osteopenia.  Multilevel facet changes, most pronounced at the L4-L5 and L5-S1 level.  Oblique images demonstrate no evidence of displaced pars defect.  Atherosclerosis.  Unremarkable appearance of the visualized  abdomen.  IMPRESSION: No acute fracture or malalignment of the lumbar spine.  Interval changes of T11 vertebral augmentation.  Osteopenia.  Signed,  Dulcy Fanny. Earleen Newport, DO  [facility]  [facility]   Electronically Signed   By: Corrie Mckusick D.O.   On: 07/18/2015 16:48   Dg Pelvis 1-2 Views  07/18/2015   CLINICAL DATA:  Right sided pelvic pain and right posterior hip pain after a fall at home from a standing height on to a lamina for at 0400 hr earlier today. Patient was unable to get up on her own from the floor. Initial encounter. Prior left femoral neck fracture in December, 2015.  EXAM: PELVIS - 1-2 VIEW  COMPARISON:  AP pelvis x-ray 12/18/1,015. Bone window images from CT pelvis 06/28/2015.  FINDINGS: No evidence of acute fracture. Both hip joints intact with well preserved joint spaces for age. Generalized osseous demineralization. Sacroiliac joints and symphysis pubis intact. Prior ORIF of a left femoral neck fracture with as yet incomplete healing.  IMPRESSION: 1. No acute osseous abnormality. 2. Osseous demineralization. 3. ORIF of a prior left femoral neck fracture with incomplete healing.   Electronically Signed   By: Evangeline Dakin M.D.   On: 07/18/2015 16:52   Ct Head Wo Contrast  07/18/2015   CLINICAL DATA:  Status post fall today hit the back of the head. The patient states she is dizzy.  EXAM: CT HEAD WITHOUT CONTRAST  TECHNIQUE: Contiguous axial images were obtained from the base of the skull through the vertex without intravenous contrast.  COMPARISON:  November 12, 2013  FINDINGS: There is chronic diffuse atrophy. Chronic bilateral periventricular white matter small vessel ischemic changes identified. There is no midline shift, hydrocephalus. The previously noted sub cm fatty mass dorsal to the tectum is unchanged. No acute hemorrhage or acute transcortical infarct is identified. The bony calvarium is intact. The visualized sinuses are clear.   IMPRESSION: No focal acute intracranial abnormality identified.   Electronically Signed   By: Abelardo Diesel M.D.   On: 07/18/2015 16:47    Scheduled Meds: . cholecalciferol  2,000 Units Oral Daily  . enoxaparin (LOVENOX) injection  40 mg Subcutaneous Q24H  . insulin aspart  0-5 Units Subcutaneous QHS  . insulin aspart  0-9 Units Subcutaneous TID WC  . insulin glargine  5 Units Subcutaneous BID  . metoprolol tartrate  12.5 mg Oral BID  . multivitamin with minerals  1 tablet Oral q morning - 10a  . simvastatin  40 mg Oral QHS  . sodium chloride  3 mL Intravenous Q12H   Continuous Infusions:     Time spent: 25 minutes    T[place]on Roker, [place]  [facility] Pager [phone]. If 7PM-7AM, please contact night-coverage at [email or web address], password [facility] 07/20/2015, 1:12 PM

## 2015-07-20 NOTE — Progress Notes (Signed)
  Echocardiogram 2D Echocardiogram has been performed.  Donata Clay 07/20/2015, 9:05 AM

## 2015-07-20 NOTE — Progress Notes (Signed)
Occupational Therapy Evaluation Patient Details Name: [name] MRN: [id number] DOB: 11/24/1946 Today's Date: 07/20/2015    History of Present Illness [name] is a 69yo woman with PMH of DM2, HTN, HLD, CAD s/p MI, h/o SVT on beta blocker, arthritis who presents after syncope and a fall. Incidentally, she had kyphoplasty of T11 2 days ago.She was walking to the refrigerator  and then passed out. Laid on the floor for upwards of 6 hours due to not being able to get to the phone.    Clinical Impression   Attempted to take BP in sitting, however, pt unable to tolerate pain from BP cuff. PTA, pt mod I with ADL and mobility an lived alone. Evaluation limited due to pain from BP cuff and pt intently requesting to get back into bed and declining to sit OOB in chair. At this time Pt will need to have 24/7 S to safely D/C home. Due to limited support and generalized weakness and pain, feel pt would benefit form rehab at [facility]. Will follow acutely to address established goals.     Follow Up Recommendations  SNF;Supervision/Assistance - 24 hour    Equipment Recommendations  Tub/shower bench    Recommendations for Other Services       Precautions / Restrictions Precautions Precautions: Fall      Mobility Bed Mobility Overal bed mobility: Needs Assistance Bed Mobility: Rolling;Sidelying to Sit  Sidelying to sit: Min assist       General bed mobility comments: educated pt on back precautions however, pt stated she had "to do it her way"  Transfers Overall transfer level: Needs assistance Equipment used: 1 person hand held assist Transfers: Sit to/from Stand Sit to Stand: Min assist         General transfer comment: Min assist for safety and steadiness; appears unsteady and at high risk for falls    Balance Overall balance assessment: Needs assistance   Sitting balance-Leahy Scale: Good       Standing balance-Leahy Scale: Poor                              ADL Overall ADL's : Needs assistance/impaired     Grooming: Set up;Sitting   Upper Body Bathing: Minimal assitance   Lower Body Bathing: Moderate assistance   Upper Body Dressing : Minimal assistance;Sitting   Lower Body Dressing: Moderate assistance;Sit to/from stand   Toilet Transfer: Minimal assistance;Stand-pivot;BSC   Toileting- Clothing Manipulation and Hygiene: Minimal assistance;Sit to/from stand       Functional mobility during ADLs: Minimal assistance;Cueing for safety;Cueing for sequencing General ADL Comments: Attempted to have pt ambulate to chair from [facility]. Trying to take BP while sitting on BSC, however, limited by pain from blood pressure cuff and stated she "couldn't take the pain anymore and that she had to go back to bed"     Vision     Perception     Praxis      Pertinent Vitals/Pain Pain Assessment: Faces Faces Pain Scale: Hurts a little bit Pain Location: back Pain Descriptors / Indicators: Dull Pain Intervention(s): Limited activity within patient's tolerance;Monitored during session     Hand Dominance     Extremity/Trunk Assessment Upper Extremity Assessment Upper Extremity Assessment: Generalized weakness   Lower Extremity Assessment Lower Extremity Assessment: Generalized weakness   Cervical / Trunk Assessment Cervical / Trunk Assessment: Kyphotic (very recent kyphoplasty)   Communication Communication Communication: No difficulties;Other (comment) (easily distracted and  tangential)   Cognition Arousal/Alertness: Awake/alert Behavior During Therapy: Anxious;Impulsive (Distractible and tangential) Overall Cognitive Status: No family/caregiver present to determine baseline cognitive functioning Area of Impairment: Safety/judgement;Awareness         Safety/Judgement: Decreased awareness of deficits Awareness: Emergent   General Comments:Appears easily agitated   General Comments       Exercises       Shoulder  Instructions      Home Living Family/patient expects to be discharged to:: Private residence Living Arrangements: Alone (Spouse has died within the past few months) Available Help at Discharge: Friend(s) Type of Home: Mobile home Home Access: Stairs to enter;Ramped entrance   Entrance Stairs-Rails: Right;Left;Can reach both Home Layout: One level     Bathroom Shower/Tub: Tub/shower unit Shower/tub characteristics: Architectural technologist: Standard Bathroom Accessibility: Yes How Accessible: Accessible via walker Home Equipment: Shower seat   Additional Comments: Pt became very anxious and fidgety while answering Home and PLOF questions and requested to change the subject      Prior Functioning/Environment Level of Independence: Independent with assistive device(s)  Gait / Transfers Assistance Needed: Uses RW for amb full-time     Comments: Will need a better picture of Ms. Gear' home situation; she did not want to speak much about her home or any assistance she has at home; she stated "I have plenty of help" but then was vague re: who can help her and how much help and then cahnged the subject    OT Diagnosis: Generalized weakness;Acute pain   OT Problem List: Decreased strength;Decreased range of motion;Decreased activity tolerance;Impaired balance (sitting and/or standing);Decreased safety awareness;Decreased knowledge of use of DME or AE;Decreased knowledge of precautions;Cardiopulmonary status limiting activity;Obesity;Pain   OT Treatment/Interventions: Self-care/ADL training;Energy conservation;DME and/or AE instruction;Therapeutic activities;Patient/family education;Balance training    OT Goals(Current goals can be found in the care plan section) Acute Rehab OT Goals Patient Stated Goal: Clearly states she wants to go home OT Goal Formulation: With patient Time For Goal Achievement: 07/20/15 Potential to Achieve Goals: Good ADL Goals Pt Will Perform Lower Body  Bathing: with modified independence;sit to/from stand;with adaptive equipment Pt Will Perform Lower Body Dressing: with modified independence;with adaptive equipment;sit to/from stand Pt Will Transfer to Toilet: with modified independence;ambulating;bedside commode Pt Will Perform Toileting - Clothing Manipulation and hygiene: with modified independence;sit to/from stand Additional ADL Goal #1: Complete funcitonal mobility for ADL tasks @ RW level @ mod I level  OT Frequency: Min 2X/week   Barriers to D/C: Decreased caregiver support          Co-evaluation              End of Session Nurse Communication: Mobility status  Activity Tolerance: Patient limited by fatigue;Patient limited by pain Patient left: in bed;with call bell/phone within reach;with bed alarm set   Time: 1030-1049 OT Time Calculation (min): 19 min Charges:  OT General Charges $OT Visit: 1 Procedure OT Evaluation $Initial OT Evaluation Tier I: 1 Procedure G-Codes: OT G-codes **NOT FOR INPATIENT CLASS** Functional Assessment Tool Used: clinical judgement Functional Limitation: Self care Self Care Current Status (X8338): At least 40 percent but less than 60 percent impaired, limited or restricted Self Care Goal Status (S5053): At least 1 percent but less than 20 percent impaired, limited or restricted  Kallie Depolo,HILLARY 07/20/2015, 11:05 AM   [facility], OTR/L  [phone] 07/20/2015

## 2015-07-20 NOTE — Evaluation (Signed)
Physical Therapy Evaluation Patient Details Name: [name] MRN: [id number] DOB: [birth year] Today's Date: 07/20/2015   History of Present Illness  [name] is a 69yo woman with PMH of DM2, HTN, HLD, CAD s/p MI, h/o SVT on beta blocker, arthritis who presents after syncope and a fall. Incidentally, she had kyphoplasty of T11 2 days ago. [name] reports that she was walking to the refrigerator today and she developed a few minutes of lightheadedness and then passed out. She reports that she did not completely lose consciousness, but did have to lie on the floor for upwards of 6 hours due to not being able to get to the phone. She denies seizure like activity, loss of bladder or bowel. She reports that she thinks it was due to her sugar being low. She said that it was 70 in the ED and that is very low for her. She specifically denies palpitations, SOB, chest pain, headache.Found to have orthostatic hypotension during PT evaluation on 8/22  Clinical Impression   Pt admitted with above diagnosis. Pt currently with functional limitations due to the deficits listed below (see PT Problem List).  Pt will benefit from skilled PT to increase their independence and safety with mobility to allow discharge to the venue listed below.    We must consider SNF for rehab to increase activity tolerance and maximize independence and safety with mobility prior to dc home; She has been to SNF for rehab before after a hip fracture, and it seems that she had good results; The sticky part is this: she may refuse SNF, and I'm not sure that she has a solid support system at home -- she was very vague about any help she has; We will need more info -- It's worth considering a SW consult for a psychosocial assessment.     Follow Up Recommendations Supervision/Assistance - 24 hour;Other (comment)  She lives alone: we must consider SNF if she does not progress quickly with mobility and ADLs.    Equipment  Recommendations  3in1 (PT)    Recommendations for Other Services OT consult  SW for psychosocial assessment    Precautions / Restrictions Precautions Precautions: Fall      Mobility  Bed Mobility Overal bed mobility: Needs Assistance Bed Mobility: Rolling;Sidelying to Sit Rolling: Min assist Sidelying to sit: Mod assist       General bed mobility comments: Cues for technique and initiation; Light mod assist to push form sidelie to sit  Transfers Overall transfer level: Needs assistance Equipment used: Rolling walker (2 wheeled) Transfers: Sit to/from Stand Sit to Stand: Min assist         General transfer comment: Min assist for safety and steadiness; near constant cues to self-monitor for activity tolerance  Ambulation/Gait             General Gait Details: Held ambulation and other upright activity due to marked orthostatic hypotension  Stairs            Wheelchair Mobility    Modified Rankin (Stroke Patients Only)       Balance Overall balance assessment: Needs assistance           Standing balance-Leahy Scale: Poor                               Pertinent Vitals/Pain Pain Assessment: Faces Faces Pain Scale: Hurts a little bit Pain Location: Reports minimal pain from her recent kyphoplasty Pain  Descriptors / Indicators: Dull Pain Intervention(s): Monitored during session    Home Living Family/patient expects to be discharged to:: Private residence Living Arrangements: Alone (Spouse has died within the past few months) Available Help at Discharge: Friend(s) Type of Home: Mobile home Home Access: Stairs to enter;Ramped entrance Entrance Stairs-Rails: Right;Left;Can reach both   Home Layout: One level   Additional Comments: Pt became very anxious and fidgety while answering Home and PLOF questions and requested to change the subject    Prior Function Level of Independence: Independent with assistive device(s)   Gait /  Transfers Assistance Needed: Uses RW for amb full-time     Comments: Will need a better picture of [name]' home situation; she did not want to speak much about her home or any assistance she has at home; she stated "I have plenty of help" but then was vague re: who can help her and how much help and then cahnged the subject     Cooke Dominance        Extremity/Trunk Assessment   Upper Extremity Assessment: Defer to OT evaluation           Lower Extremity Assessment: Generalized weakness      Cervical / Trunk Assessment:  (very recent kyphoplasty)  Communication   Communication: No difficulties;Other (comment) (easily distracted and tangential)  Cognition Arousal/Alertness: Awake/alert Behavior During Therapy: Anxious;Impulsive (Distractible and tangential) Overall Cognitive Status: No family/caregiver present to determine baseline cognitive functioning Area of Impairment: Safety/judgement         Safety/Judgement: Decreased awareness of deficits     General Comments: Pt almost perseverative re: going home however does not seem to have a workable plan for safety in the home; did not quite put together that if she is unable to stand for greater than 2 minutes, how will she be able to manage in the home alone; She changes the subject quickly    General Comments General comments (skin integrity, edema, etc.):   07/20/15 0904  Vital Signs  Patient Position (if appropriate) Orthostatic Vitals  Orthostatic Lying   BP- Lying 141/67 mmHg  Pulse- Lying 78  Orthostatic Sitting  BP- Sitting 99/81 mmHg  Pulse- Sitting 115  Orthostatic Standing at 0 minutes  BP- Standing at 0 minutes (!) 86/62 mmHg  Pulse- Standing at 0 minutes 107  Orthostatic Standing at 3 minutes  BP- Standing at 3 minutes (Unable to stand 3 minutes )    Interestingly, her hypotension did not present with lightheadedness; Instead [name] reported "not feeling right", and "wanting to scream"      Exercises        Assessment/Plan    PT Assessment Patient needs continued PT services  PT Diagnosis Generalized weakness;Difficulty walking   PT Problem List Decreased strength;Decreased activity tolerance;Decreased balance;Decreased mobility;Decreased cognition;Decreased knowledge of use of DME;Decreased safety awareness;Cardiopulmonary status limiting activity;Pain  PT Treatment Interventions DME instruction;Gait training;Functional mobility training;Therapeutic activities;Therapeutic exercise;Balance training;Cognitive remediation;Patient/family education   PT Goals (Current goals can be found in the Care Plan section) Acute Rehab PT Goals Patient Stated Goal: Clearly states she wants to go home PT Goal Formulation: With patient Time For Goal Achievement: 08/03/15 Potential to Achieve Goals: Good    Frequency Min 3X/week   Barriers to discharge Decreased caregiver support Need reliable info re: any assist available to pt at home    Co-evaluation               End of Session   Activity Tolerance: Patient tolerated treatment  well;Other (comment) (limitied by orthostatic hypotension) Patient left: in bed;with call bell/phone within reach;with bed alarm set Nurse Communication: Mobility status;Other (comment) (Pt really wants something salty to eat)    Functional Assessment Tool Used: Clinical Judgement Functional Limitation: Mobility: Walking and moving around Mobility: Walking and Moving Around Current Status (Y3888): At least 20 percent but less than 40 percent impaired, limited or restricted Mobility: Walking and Moving Around Goal Status [phone]): 0 percent impaired, limited or restricted    Time: 0904-0930 PT Time Calculation (min) (ACUTE ONLY): 26 min   Charges:   PT Evaluation $Initial PT Evaluation Tier I: 1 Procedure PT Treatments $Therapeutic Activity: 8-22 mins   PT G Codes:   PT G-Codes **NOT FOR INPATIENT CLASS** Functional Assessment Tool Used:  Clinical Judgement Functional Limitation: Mobility: Walking and moving around Mobility: Walking and Moving Around Current Status (K8206): At least 20 percent but less than 40 percent impaired, limited or restricted Mobility: Walking and Moving Around Goal Status [phone]): 0 percent impaired, limited or restricted    Roney Marion [facility] 07/20/2015, 9:56 AM  Roney Marion, [place] Pager [phone] Office [phone]

## 2015-07-20 NOTE — Care Management Note (Signed)
Case Management Note  Patient Details  Name: SESILIA POUCHER MRN: [id number] Date of Birth: 03-05-1946  Subjective/Objective:   Admitted with Syncopy                 Action/Plan: Talked to patient about DCP; Patient lives alone, very supportive friends/ spouse died in 28-Dec-2023 also. Patient stated that in 2023/12/28 she was admitted to [facility] SNF for a short time and for this admission she wants to go home with [facility] services. HHC choice offered, patient chose [facility]. Tiffany with [facility] called for arrangements; No DME needed, she has a walker and wheel chair at home. Patient has insurance with [facility] with prescription drug coverage and does not have any problem getting her medication; pharmacy of choice is [facility].  Expected Discharge Date:   07/21/2015               Expected Discharge Plan:  [place] Discharge planning Services  CM Consult  Choice offered to:  Patient  HH Arranged:  RN, Disease Management, PT [place] Agency:  [facility]  Status of Service:  In process, will continue to follow   Sherrilyn Rist [phone] 07/20/2015, 11:43 AM

## 2015-07-21 ENCOUNTER — Encounter (HOSPITAL_COMMUNITY): Payer: Self-pay | Admitting: General Practice

## 2015-07-21 DIAGNOSIS — I951 Orthostatic hypotension: Secondary | ICD-10-CM | POA: Diagnosis not present

## 2015-07-21 DIAGNOSIS — R000 Tachycardia, unspecified: Secondary | ICD-10-CM | POA: Diagnosis not present

## 2015-07-21 LAB — GLUCOSE, CAPILLARY
GLUCOSE-CAPILLARY: 183 mg/dL — AB (ref 65–99)
GLUCOSE-CAPILLARY: 121 mg/dL — AB (ref 65–99)
Glucose-Capillary: 113 mg/dL — ABNORMAL HIGH (ref 65–99)
Glucose-Capillary: 131 mg/dL — ABNORMAL HIGH (ref 65–99)
Glucose-Capillary: 131 mg/dL — ABNORMAL HIGH (ref 65–99)

## 2015-07-21 LAB — BASIC METABOLIC PANEL
ANION GAP: 7 (ref 5–15)
BUN: 9 mg/dL (ref 6–20)
CO2: 26 mmol/L (ref 22–32)
Calcium: 8.6 mg/dL — ABNORMAL LOW (ref 8.9–10.3)
Chloride: 107 mmol/L (ref 101–111)
Creatinine, Ser: 0.61 mg/dL (ref 0.44–1.00)
GFR calc Af Amer: >60 mL/min (ref >60)
GFR calc non Af Amer: >60 mL/min (ref >60)
GLUCOSE: 146 mg/dL — AB (ref 65–99)
POTASSIUM: 4.1 mmol/L (ref 3.5–5.1)
Sodium: 140 mmol/L (ref 135–145)

## 2015-07-21 LAB — MAGNESIUM: MAGNESIUM: 1.6 mg/dL — AB (ref 1.7–2.4)

## 2015-07-21 LAB — TSH: TSH: 0.414 u[IU]/mL (ref 0.350–4.500)

## 2015-07-21 MED ORDER — MAGNESIUM SULFATE 2 GM/50ML IV SOLN
2.0000 g | Freq: Once | INTRAVENOUS | Status: AC
  Administered 2015-07-21: 2 g via INTRAVENOUS
  Filled 2015-07-21: qty 50

## 2015-07-21 MED ORDER — METOPROLOL TARTRATE 50 MG PO TABS
50.0000 mg | ORAL_TABLET | Freq: Two times a day (BID) | ORAL | Status: DC
  Administered 2015-07-21 – 2015-07-23 (×5): 50 mg via ORAL
  Filled 2015-07-21 (×6): qty 1

## 2015-07-21 MED ORDER — ENSURE ENLIVE PO LIQD
237.0000 mL | Freq: Two times a day (BID) | ORAL | Status: DC
  Administered 2015-07-23: 237 mL via ORAL

## 2015-07-21 NOTE — Progress Notes (Signed)
Pt BP elevated this AM 172/90. Pt asymptomatic, lungs clear to auscultation, no pain at this time, pt resting comfortably in bed. NP on call K Baltazar Najjar notified via amion textpage.

## 2015-07-21 NOTE — Progress Notes (Signed)
[facility] PROGRESS NOTE  Melissa URBACH TGP:[id number] DOB: 12-31-1945 DOA: 07/18/2015 PCP: Shirline Frees, MD  Brief narrative 69 year old female with history of hypertension, type 2 diabetes mellitus, CAD with history of MI, history of SVT on beta blocker, arthritis, hyperlipidemia with recent T11 kyphoplasty who presented after sustaining a fall at home and was on the floor for almost 6 hours not being able to get up. She reports feeling lightheaded and slumping on the ground but denies any loss of consciousness, seizure-like activity or loss of bladder /bowel control. He reports that her blood sugar was in the 70s in the ED which is quite low for her. In the ED patient was tachycardic, normal lab work and troponin. EKG was unremarkable. Chest x-ray showed minimal left pleural effusion which was unchanged. X-ray of the spine showed recent changes to T11 after kyphoplasty without any fractures. Head CT unremarkable. Patient admitted under observation. Orthostasis positive.    Assessment/Plan: Near syncope and collapse Appears to be secondary to orthostatic hypotension. -Improved with aggressive IV hydration. Still tachycardic in 120s-130s periodically. Still  Orthostatic  this morning but much improved. Will increase metoprolol to 50 mg twice daily. -If no further improvement despite correction of orthostasis and with increased dose of beta blocker will consult cardiology.   SVT Recently started on her blocker by cardiology during outpatient evaluation for cardiac clearance. Heart rate going up to 150 occasionally. I will increase the metoprolol dose. Pain is in still low this morning will replenish. Check TSH. 2-D echo with normal EF. No wall motion abnormality.  Coronary artery disease Stable. Continue metoprolol and statin. Holding Lasix and ACE inhibitor.  Type 2 diabetes mellitus A1c of 5.3. I have discontinued all her medications (should not be continued upon discharge).  Monitor on sliding scale insulin alone.  Diastolic CHF Currently euvolemic. Holding Lasix.  Hyperlipidemia Continue statin  DVT prophylaxis: Subcutaneous Lovenox  Diet: Heart healthy  Code Status: Full code Family Communication: None at bedside  Disposition Plan: Patient is refusing skilled nursing facility. Will arrange home health upon discharge. Possible tomorrow.   Consultants:  None  Procedures:  2 d echo  Head CT  Antibiotics:  NONE  HPI/Subjective: Patient seen and examined. Denies any symptoms. Heart rate again found going up to 150 , ranging in 120-130 periodically. Patient wanting to go home.  Objective: Filed Vitals:   07/21/15 1056  BP:   Pulse: 83  Temp: 97.9 F (36.6 C)  Resp: 16    Intake/Output Summary (Last 24 hours) at 07/21/15 1104 Last data filed at 07/21/15 0900  Gross per 24 hour  Intake 4759.17 ml  Output   2251 ml  Net 2508.17 ml   Filed Weights   07/18/15 2035 07/20/15 0536 07/21/15 0525  Weight: 80.1 kg (176 lb 9.4 oz) 77.9 kg (171 lb 11.8 oz) 81.103 kg (178 lb 12.8 oz)    Exam:   General:   no acute distress  HEENT:  moist oral mucosa, supple neck  Chest: Clear to auscultation bilaterally  CVS: Normal S1 and S2, no murmurs rub or gallop  GI: Soft, nondistended, nontender, bowel sounds present  Musculoskeletal : Warm, no edema    Data Reviewed: Basic Metabolic Panel:  Recent Labs Lab 07/15/15 1012 07/18/15 1514 07/19/15 0229 07/19/15 1015 07/20/15 0310 07/21/15 0547  NA 138 138 140  --  138  --   K 4.0 3.8 3.5  --  3.9  --   CL 102 101 103  --  99*  --  CO2 22 24 28   --  30  --   GLUCOSE 119* 113* 114*  --  94  --   BUN 17 11 9   --  11  --   CREATININE 0.84 0.69 0.67  --  0.69  --   CALCIUM 9.5 9.1 8.9  --  9.0  --   MG  --   --   --  1.3*  --  1.6*   Liver Function Tests:  Recent Labs Lab 07/15/15 1012  AST 25  ALT 16  ALKPHOS 91  BILITOT 1.2  PROT 7.7  ALBUMIN 3.4*   No results for  input(s): LIPASE, AMYLASE in the last 168 hours. No results for input(s): AMMONIA in the last 168 hours. CBC:  Recent Labs Lab 07/15/15 1012 07/18/15 1514 07/19/15 0229  WBC 9.2 8.0 6.5  NEUTROABS 6.3 5.6  --   HGB 14.7 14.4 12.8  HCT 43.8 43.0 38.4  MCV 90.7 91.1 90.6  PLT 225 176 175   Cardiac Enzymes:  Recent Labs Lab 07/18/15 1514 07/18/15 2206 07/19/15 0229 07/19/15 1015  CKTOTAL 44  --   --   --   TROPONINI <0.03 <0.03 <0.03 <0.03   BNP (last 3 results)  Recent Labs  12/05/14 0145  BNP 76.4    ProBNP (last 3 results) No results for input(s): PROBNP in the last 8760 hours.  CBG:  Recent Labs Lab 07/20/15 0557 07/20/15 1121 07/20/15 1624 07/20/15 2156 07/21/15 0529  GLUCAP 107* 198* 117* 113* 113*    Recent Results (from the past 240 hour(s))  Surgical pcr screen     Status: None   Collection Time: 07/15/15 10:11 AM  Result Value Ref Range Status   MRSA, PCR NEGATIVE NEGATIVE Final   Staphylococcus aureus NEGATIVE NEGATIVE Final    Comment:        The Xpert SA Assay (FDA approved for NASAL specimens in patients over 71 years of age), is one component of a comprehensive surveillance program.  Test performance has been validated by [facility] for patients greater than or equal to 71 year old. It is not intended to diagnose infection nor to guide or monitor treatment.      Studies: No results found.  Scheduled Meds: . cholecalciferol  2,000 Units Oral Daily  . enoxaparin (LOVENOX) injection  40 mg Subcutaneous Q24H  . insulin aspart  0-5 Units Subcutaneous QHS  . insulin aspart  0-9 Units Subcutaneous TID WC  . magnesium sulfate 1 - 4 g bolus IVPB  2 g Intravenous Once  . metoprolol tartrate  50 mg Oral BID  . multivitamin with minerals  1 tablet Oral q morning - 10a  . simvastatin  40 mg Oral QHS  . sodium chloride  3 mL Intravenous Q12H   Continuous Infusions:     Time spent: 25 minutes    Clarece Drzewiecki, [place]  [facility] Pager [phone]. If 7PM-7AM, please contact night-coverage at [email or web address], password [facility] 07/21/2015, 11:04 AM

## 2015-07-21 NOTE — Progress Notes (Signed)
Pt with 2 episodes of HR up to 150 non-sustained. Pt asymptomatic, resting in bed. Per chart pt with previous episodes of SVT. Mg lab already ordered this AM d/t history of SVT. Will continue to monitor. Ronnette Hila, RN

## 2015-07-21 NOTE — Progress Notes (Signed)
Pt with order for NS @ 235ml/hr. NP paged to clarify order as pt with hx HF. Pt in no distress at this time, VSS. New order to continue IVF and monitor respiratory status. Will continue to monitor. Ronnette Hila, RN

## 2015-07-21 NOTE — Clinical Social Work Note (Signed)
CSW Consult Acknowledged:   CSW received a consult for SNF placement. CSW reviewed PT/OT's evaluations. CSW spoke with the. Pt reported that she will not go to SNF. Pt acknowledged that she lives alone. Pt reported that she has received rehab in the past. Pt shared that she feel that she can make it at home with home health. CSW informed the case manager. CSW will sign off.         [place], MSW, [place]

## 2015-07-22 DIAGNOSIS — I951 Orthostatic hypotension: Secondary | ICD-10-CM | POA: Diagnosis not present

## 2015-07-22 LAB — GLUCOSE, CAPILLARY
Glucose-Capillary: 129 mg/dL — ABNORMAL HIGH (ref 65–99)
Glucose-Capillary: 159 mg/dL — ABNORMAL HIGH (ref 65–99)
Glucose-Capillary: 151 mg/dL — ABNORMAL HIGH (ref 65–99)
Glucose-Capillary: 108 mg/dL — ABNORMAL HIGH (ref 65–99)

## 2015-07-22 MED ORDER — MIDODRINE HCL 2.5 MG PO TABS
2.5000 mg | ORAL_TABLET | Freq: Two times a day (BID) | ORAL | Status: DC
  Administered 2015-07-22 – 2015-07-23 (×2): 2.5 mg via ORAL
  Filled 2015-07-22 (×4): qty 1

## 2015-07-22 NOTE — Clinical Social Work Placement (Signed)
   CLINICAL SOCIAL WORK PLACEMENT  NOTE  Date:  07/22/2015  Patient Details  Name: [name] MRN: [id number] Date of Birth: Feb 20, 1946  Clinical Social Work is seeking post-discharge placement for this patient at the [place] level of care (*CSW will initial, date and re-position this form in  chart as items are completed):  Yes   Patient/family provided with [facility]'s list of facilities offering this level of care within the geographic area requested by the patient (or if unable, by the patient's family).  Yes   Patient/family informed of their freedom to choose among providers that offer the needed level of care, that participate in Medicare, Medicaid or managed care program needed by the patient, have an available bed and are willing to accept the patient.  Yes   Patient/family informed of [facility]'s ownership interest in [facility] and [facility], as well as of the fact that they are under no obligation to receive care at these facilities.  PASRR submitted to EDS on       PASRR number received on       Existing PASRR number confirmed on 07/22/15     FL2 transmitted to all facilities in geographic area requested by pt/family on 07/22/15     FL2 transmitted to all facilities within larger geographic area on       Patient informed that his/her managed care company has contracts with or will negotiate with certain facilities, including the following:            Patient/family informed of bed offers received.  Patient chooses bed at       Physician recommends and patient chooses bed at      Patient to be transferred to   on  .  Patient to be transferred to facility by       Patient family notified on   of transfer.  Name of family member notified:        PHYSICIAN       Additional Comment:    _______________________________________________ Greta Doom, LCSW 07/22/2015, 1:04 PM

## 2015-07-22 NOTE — Discharge Summary (Signed)
Patient ID: [name] MRN: [id number] DOB/AGE: Jan 05, 1946 69 y.o.  Admit date: 07/15/2015 Discharge date: 07/16/2015  Admission Diagnoses:  Active Problems:   Compression fracture   Discharge Diagnoses:  Same  Past Medical History  Diagnosis Date  . Hypertension   . Hypercholesteremia   . CAD (coronary artery disease)     a. NSTEMI in setting of UTI and SVT 12/2012 => LHC 01/08/13: Proximal LAD 30%, ostial diagonal 30-40%, proximal RCA 95%, inferior AK, EF 45-50% => PCI: Promus DES x 2 to RCA;  b. Echo 01/05/13: EF 50-27%, grade 1 diastolic dysfunction, MAC   . SVT (supraventricular tachycardia)   . Rectal ulceration     colonoscopy 12/2012  . Anxiety     Due to current back pain and has to lie flat.  . Arthritis   . History of blood transfusion   . Myocardial infarction 2014  . Type II diabetes mellitus     Surgeries: Procedure(s): T11 KYPHOPLASTY on 07/15/2015   Consultants:  none  Discharged Condition: Improved  Hospital Course: [name] is an 69 y.o. female who was admitted 07/15/2015 for operative treatment of kyphoplasty. Patient has severe unremitting pain that affects sleep, daily activities, and work/hobbies. After pre-op clearance the patient was taken to the operating room on 07/15/2015 and underwent  Procedure(s): T11 KYPHOPLASTY.    Patient was given perioperative antibiotics:  Anti-infectives    Start     Dose/Rate Route Frequency Ordered Stop   07/15/15 2000  ceFAZolin (ANCEF) IVPB 1 g/50 mL premix     1 g 100 mL/hr over 30 Minutes Intravenous Every 8 hours 07/15/15 1729 07/16/15 0429   07/15/15 1130  ceFAZolin (ANCEF) IVPB 2 g/50 mL premix     2 g 100 mL/hr over 30 Minutes Intravenous To [facility] 07/14/15 1106 07/15/15 1201       Patient was given sequential compression devices, early ambulation to prevent DVT.  Patient benefited maximally from hospital stay and there were no complications.    Recent vital signs: BP 149/61  mmHg  Pulse 73  Temp(Src) 98.2 F (36.8 C) (Oral)  Resp 16  Wt 81.647 kg (180 lb)  SpO2 98%  Discharge Medications:     Medication List    STOP taking these medications        aspirin EC 81 MG tablet     HYDROcodone-acetaminophen 5-325 MG per tablet  Commonly known as:  NORCO/VICODIN      TAKE these medications        docusate sodium 100 MG capsule  Commonly known as:  COLACE  Take 1 capsule (100 mg total) by mouth daily as needed for mild constipation.     furosemide 40 MG tablet  Commonly known as:  LASIX  Take 40 mg by mouth every morning.     insulin glargine 100 UNIT/ML injection  Commonly known as:  LANTUS  Inject 0.05 mLs (5 Units total) into the skin 2 (two) times daily.     metFORMIN 1000 MG tablet  Commonly known as:  GLUCOPHAGE  Take 1,000 mg by mouth 2 (two) times daily with a meal.     metoprolol tartrate 25 MG tablet  Commonly known as:  LOPRESSOR  Take 1 tablet (25 mg total) by mouth 2 (two) times daily.     multivitamin with minerals Tabs tablet  Take 1 tablet by mouth every morning.     nitroGLYCERIN 0.4 MG SL tablet  Commonly known as:  NITROSTAT  Place 1 tablet (0.4 mg total) under the tongue every 5 (five) minutes as needed for chest pain.     ondansetron 4 MG disintegrating tablet  Commonly known as:  ZOFRAN ODT  Take 1 tablet (4 mg total) by mouth every 8 (eight) hours as needed for nausea or vomiting.     pioglitazone 15 MG tablet  Commonly known as:  ACTOS  Take 1 tablet (15 mg total) by mouth daily.     ramipril 10 MG capsule  Commonly known as:  ALTACE  Take 10 mg by mouth at bedtime.     simvastatin 40 MG tablet  Commonly known as:  ZOCOR  Take 40 mg by mouth at bedtime.     Vitamin D3 1000 UNITS Caps  Take 2,000 Units by mouth every morning.        Diagnostic Studies: Dg Chest 1 View  07/18/2015   CLINICAL DATA:  Status post fall today.  Right hip and back pain.  EXAM: CHEST  1 VIEW  COMPARISON:  June 28, 2015,  November 14, 2014  FINDINGS: The heart size and mediastinal contours are within normal limits. There is minimal left pleural effusion versus pleural thickening unchanged compared to prior chest x-ray of July 2016. There is no focal pneumonia or pulmonary edema. Patient status post prior vertebroplasty of thoracic vertebral bodies.  IMPRESSION: Minimal left pleural effusion versus pleural thickening unchanged compared to prior chest x-ray of July 2016. Otherwise no acute abnormality identified.   Electronically Signed   By: Abelardo Diesel M.D.   On: 07/18/2015 16:52   Dg Chest 1 View  06/28/2015   CLINICAL DATA:  Vomiting, lower abdominal pain.  EXAM: CHEST  1 VIEW  COMPARISON:  11/14/2014  FINDINGS: Mild hyperinflation of the lungs. Mild cardiomegaly. Lungs are clear. No effusions. No acute bony abnormality.  IMPRESSION: Mild hyperinflation and cardiomegaly.  No active disease.   Electronically Signed   By: Rolm Baptise M.D.   On: 06/28/2015 13:27   Dg Thoracic Spine 2 View  07/18/2015   CLINICAL DATA:  69 year old female with a history of thoracic pain.  EXAM: THORACIC SPINE 2 VIEWS  COMPARISON:  MRI 06/30/2015  FINDINGS: Thoracic Spine:  Thoracic vertebral elements maintain normal anatomic alignment, with no evidence of subluxation.  Alignment of the thoracic vertebral elements similar to that of the prior MRI, with accentuated kyphotic curvature.  Interval changes of T11 vertebral augmentation.  Similar appearance of T8 vertebral augmentation, present on prior MRI.  Re- demonstration of T7 wedge compression fracture, not significantly changed from the comparison MRI.  No fracture line identified.  Diffuse osteopenia.  Changes of degenerative disc disease.  No acute fracture line identified.  IMPRESSION: Negative for acute fracture or malalignment of the thoracic spine, with unchanged configuration of vertebral bodies, including known T7 wedge compression fracture.  Interval changes of T11 vertebral  augmentation, with similar appearance of T8 vertebral augmentation.  Osteopenia.  Signed,  Dulcy Fanny. Earleen Newport, DO  [facility]  [facility]   Electronically Signed   By: Corrie Mckusick D.O.   On: 07/18/2015 16:51   Dg Thoracic Spine 2 View  06/22/2015   CLINICAL DATA:  Back pain, onset 4 days ago.  EXAM: THORACIC SPINE - 2-3 VIEWS  COMPARISON:  MRI 12/12/2012  FINDINGS: There is T8 vertebroplasty, with no progressive loss of height since 12/12/2012. There is moderate anterior wedging of T7 which is new from 12/12/2012 but not necessarily acute. There is minimal  anterior wedging of T11, also new from 12/12/2012 but not necessarily acute. No bone lesion or bony destruction is evident. There is moderate kyphosis centered at T8.  IMPRESSION: Kyphosis with multiple vertebral compressions, age indeterminate. The greatest interval change from 12/12/2012 is at T7 where there is greater than 50% loss of height anteriorly.   Electronically Signed   By: Andreas Newport M.D.   On: 06/22/2015 20:57   Dg Lumbar Spine Complete  07/18/2015   CLINICAL DATA:  69 year old female with a history of lumbar back pain  EXAM: LUMBAR SPINE - COMPLETE 4+ VIEW  COMPARISON:  CT 06/28/2015  FINDINGS: Lumbar Spine:  Lumbar vertebral elements maintain normal alignment without evidence of subluxation.  No fracture line identified. Surgical changes of prior T11 vertebral augmentation, new from the comparison CT dated 06/28/2015.  Vertebral body heights maintained, unchanged from comparison CT.  Osteopenia.  Multilevel facet changes, most pronounced at the L4-L5 and L5-S1 level.  Oblique images demonstrate no evidence of displaced pars defect.  Atherosclerosis.  Unremarkable appearance of the visualized abdomen.  IMPRESSION: No acute fracture or malalignment of the lumbar spine.  Interval changes of T11 vertebral augmentation.  Osteopenia.  Signed,  Dulcy Fanny. Earleen Newport, DO  [facility]  [facility]   Electronically Signed   By: Corrie Mckusick D.O.   On: 07/18/2015 16:48   Dg Lumbar Spine Complete  07/15/2015   CLINICAL DATA:  Compression fracture of T11.  EXAM: LUMBAR SPINE - COMPLETE 4+ VIEW; DG C-ARM 1-60 MIN - NRPT [facility]  COMPARISON:  MRI dated 06/30/2015  FINDINGS: AP and lateral C-arm images demonstrate the patient undergoing kyphoplasty at T11.  IMPRESSION: Kyphoplasty performed at T11.   Electronically Signed   By: Lorriane Shire M.D.   On: 07/15/2015 14:21   Dg Lumbar Spine Complete  06/22/2015   CLINICAL DATA:  Low back pain  EXAM: LUMBAR SPINE - COMPLETE 4+ VIEW  COMPARISON:  02/05/2015  FINDINGS: Osteopenia. No lumbar vertebral compression deformity. There is an apparent wedge deformity at T11. This may simply represent a projectional phenomenon. Disc height is relatively maintained.  IMPRESSION: Possible T11 compression deformity. Thoracic spine imaging can be performed to further delineate.  No acute bony pathology in the lumbar spine.   Electronically Signed   By: Marybelle Killings M.D.   On: 06/22/2015 19:16   Dg Pelvis 1-2 Views  07/18/2015   CLINICAL DATA:  Right sided pelvic pain and right posterior hip pain after a fall at home from a standing height on to a lamina for at 0400 hr earlier today. Patient was unable to get up on her own from the floor. Initial encounter. Prior left femoral neck fracture in December, 2015.  EXAM: PELVIS - 1-2 VIEW  COMPARISON:  AP pelvis x-ray 12/18/1,015. Bone window images from CT pelvis 06/28/2015.  FINDINGS: No evidence of acute fracture. Both hip joints intact with well preserved joint spaces for age. Generalized osseous demineralization. Sacroiliac joints and symphysis pubis intact. Prior ORIF of a left femoral neck fracture with as yet incomplete healing.  IMPRESSION: 1. No acute osseous abnormality. 2. Osseous demineralization. 3. ORIF of a prior left femoral neck fracture with incomplete healing.    Electronically Signed   By: Evangeline Dakin M.D.   On: 07/18/2015 16:52   Ct Head Wo Contrast  07/18/2015   CLINICAL DATA:  Status post fall today hit the back of the head. The patient states she is dizzy.  EXAM: CT HEAD  WITHOUT CONTRAST  TECHNIQUE: Contiguous axial images were obtained from the base of the skull through the vertex without intravenous contrast.  COMPARISON:  November 12, 2013  FINDINGS: There is chronic diffuse atrophy. Chronic bilateral periventricular white matter small vessel ischemic changes identified. There is no midline shift, hydrocephalus. The previously noted sub cm fatty mass dorsal to the tectum is unchanged. No acute hemorrhage or acute transcortical infarct is identified. The bony calvarium is intact. The visualized sinuses are clear.  IMPRESSION: No focal acute intracranial abnormality identified.   Electronically Signed   By: Abelardo Diesel M.D.   On: 07/18/2015 16:47   Mr Thoracic Spine Wo Contrast  06/30/2015   CLINICAL DATA:  Increased mid back pain for 2 weeks. History of kyphoplasty. Kyphoplasty at T8. Interval compression fracture noted 06/28/2015.  EXAM: MRI THORACIC SPINE WITHOUT CONTRAST  TECHNIQUE: Multiplanar, multisequence MR imaging of the thoracic spine was performed. No intravenous contrast was administered.  COMPARISON:  MRI thoracic spine 12/12/2012.  CT 06/28/2015.  FINDINGS: Segmentation: Numbering used on prior exam was preserved. Counting was performed from the craniocervical junction. T8 vertebral augmentation and T11 compression fractures used as reference.  Alignment: Increased thoracic kyphosis associated with chronic T7 and T8 compression fractures.  Vertebrae: Chronic T7 compression fracture has increased compared to the prior MRI from 2014. This shows about 50% maximal loss of vertebral body height with no retropulsion. In conjunction with the T8 compression fracture with vertebral augmentation, this produces most of the kyphosis. T8 shows mild  retropulsion.  Acute/ subacute T11 compression fracture is present with about 20% loss of vertebral body height. The compression fracture is biconcave but predominantly involves superior surface. Retropulsion measures 4 mm which is concordant with the prior CT. No interval increased loss of height. The T11 compression fracture shows low signal centrally which probably represents trabecular impaction rather than a underlying destructive lesion. This vertebra appear normal on the prior exam 2014.  Cord: No cord edema or intramedullary lesion. Angulation of the cord associated with kyphosis at T7-T8. This also produces flattening of the cord.  Paraspinal tissues: Atelectasis in the dependent portions of the lungs. Bilateral renal scarring is present.  Disc levels:  No thoracic spine disc protrusions or stenosis from T1-T2 through T6-T7. Age expected disc desiccation.  T7-T8 shows mild central stenosis associated with retropulsion of the T8 vertebra which is chronic. Effacement of the ventral subarachnoid space and flattening of the ventral cord eccentric to the RIGHT. The neural foramina appear patent.  T8-T9: RIGHT foraminal stenosis associated with retropulsion and compression fracture is mild. Central canal appears adequately patent.  T9-T10:  Negative.  T10-T11: Bilateral facet arthrosis producing mild to moderate central stenosis. The stenosis is in conjunction with retropulsion of the vertebra below. Flattening of the posterior aspect of the cord associated with facet arthrosis and ligamentum flavum redundancy.  T11-T12:  Facet arthrosis without stenosis.  T12-L1:  Negative.  IMPRESSION: 1. Acute or subacute T11 compression fracture with 20% loss of vertebral body height. Mild retropulsion measuring 4 mm producing mild to moderate central stenosis in conjunction with facet hypertrophy and ligamentum flavum redundancy at T10-T11. 2. Chronic T8 compression fracture with vertebral augmentation. 3. New T7 compression  fracture since the prior MRI. In concert with the compression fracture at T8, this produces a thoracic kyphosis centered at the T7-T8 disc space which angulates the cord.   Electronically Signed   By: Dereck Ligas M.D.   On: 06/30/2015 16:23   Ct Abdomen Pelvis W  Contrast  06/28/2015   CLINICAL DATA:  Generalized fatigue and lower abdominal pain. Decreased appetite for several days. History of chronic back pain.  EXAM: CT ABDOMEN AND PELVIS WITH CONTRAST  TECHNIQUE: Multidetector CT imaging of the abdomen and pelvis was performed using the standard protocol following bolus administration of intravenous contrast.  CONTRAST:  100 cc Omnipaque 300  COMPARISON:  CT abdomen pelvis - 02/05/2015; thoracic spine radiographs -06/22/2015  FINDINGS: Normal hepatic contour. No discrete hepatic lesions. Normal appearance of the gallbladder. No radiopaque gallstones. No intra or extrahepatic biliary duct dilatation. No ascites.  There is symmetric enhancement and excretion of the bilateral kidneys. The right kidney remains atrophic in comparison to the left. Mild fetal lobulation is noted of the bilateral kidneys. No urinary obstruction or perinephric stranding. No definite renal stones this postcontrast examination. No discrete renal lesions. Normal appearance of the bilateral adrenal glands, pancreas and spleen.  Large colonic stool burden without evidence of enteric obstruction. Colonic diverticulosis without evidence of diverticulitis. The bowel is normal in course and caliber without wall thickening or evidence of obstruction. Normal appearance of the appendix. No pneumoperitoneum, pneumatosis or portal venous gas.  Scattered mixed calcified and noncalcified atherosclerotic plaque within a normal caliber abdominal aorta. The major branch vessels of the abdominal aorta appear patent on this non CTA examination. 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  Note is made of an approximately 3.4 x 2.4 cm hypo attenuating (8 Hounsfield unit) left-sided adnexal cyst. No discrete right-sided adnexal lesions. Normal appearance of the urinary bladder given degree distention. No free fluid in the pelvic cul-de-sac.  Limited visualization of the lower thorax demonstrates minimal subsegmental atelectasis within the imaged caudal aspects of the right middle lobe and lingula. Minimal dependent subpleural ground-glass atelectasis. No focal airspace opacities. No pleural effusion.  Cardiomegaly. Coronary artery calcifications. No pericardial effusion.  There is a acute mild (under 25%) compression deformity involving the superior endplate of the N46 vertebral body (sagittal image 82, series 204) with minimal (approximately 4 mm) of retropulsion of the posterior aspect of the superior endplate towards the spinal canal (sagittal image 83, series 204; axial image 12, series 201).  Post dynamic screw fixation of the left femoral neck with intra medullary rod fixation of the proximal femur.  Regional soft tissues appear normal.  IMPRESSION: 1. Acute mild (under 25%) compression deformity involving the superior endplate of the E70 vertebral body with minimal (approximately 4 mm) retropulsion, similar to thoracic spine radiographs performed 06/22/2015 though new since the abdominal CT performed 02/05/2015. Correlation for point tenderness at this location is recommended. Further evaluation with MRI could be performed as clinically indicated. 2. Large colonic stool burden without evidence of enteric obstruction. 3. Colonic diverticulosis without evidence of diverticulitis. 4. Similar findings of asymmetric atrophy of the right kidney in comparison to the left. No evidence of urinary obstruction. 5. Grossly unchanged approximately 3.4 cm left-sided cystic adnexal lesion - while an abnormal finding in this postmenopausal patient, given lack of interval change since the  01/2015 examination, this is likely of benign etiology.   Electronically Signed   By: Sandi Mariscal M.D.   On: 06/28/2015 14:33   Dg C-arm 1-60 Min-no Report  07/15/2015   CLINICAL DATA: surgery   C-ARM 1-60 MINUTES  Fluoroscopy was utilized by the requesting physician.  No radiographic  interpretation.     Disposition: 01-Home or Self Care   POD #1 s/p T11  kyphoplasty procedure, doing great  - encourage ambulation - Percocet for pain, Valium for muscle spasms - d/c home today -Written scripts for pain signed and in chart -D/C instructions sheet printed and in chart -F/U in office 2 weeks   Signed: Justice Britain 07/22/2015, 1:02 PM

## 2015-07-22 NOTE — Clinical Social Work Note (Signed)
Clinical Social Work Assessment  Patient Details  Name: [name] MRN: [id number] Date of Birth: 15-Feb-1946  Date of referral:  07/22/15               Reason for consult:  Facility Placement                Housing/Transportation Living arrangements for the past 2 months:  Single Family Home Source of Information:  Patient Patient Interpreter Needed:  None Criminal Activity/Legal Involvement Pertinent to Current Situation/Hospitalization:  No - Comment as needed Significant Relationships:  Friend Lives with:  Self Do you feel safe going back to the place where you live?  No Need for family participation in patient care:  No (Coment)  Care giving concerns:  None    Facilities manager / plan:  CSW met the pt at bedside. Pt informed the CSW that she has had a change of mind about SNF. CSW provided the pt with a SNF list. CSW and pt discussed geographic location in which the she would like to receive rehab. The pt reported that she will like to remain in [facility]. Pt reported that she called [employer] yesterday and would like to go there. CSW answered all questions in which the pt inquired about. CSW will continue to follow this pt and assist with discharge as needed.   Employment status:  Retired Nurse, adult PT Recommendations:  [place] / Referral to community resources:  Register  Patient/Family's Response to care:  Pt reported that the care she has received has been awesome.   Patient/Family's Understanding of and Emotional Response to Diagnosis, Current Treatment, and Prognosis: Pt acknowledged her current diagnosis and treatment. Pt reported after she speak with the MD she felt it was best that she go to rehab prior to going home.   Emotional Assessment Appearance:  Appears stated age Attitude/Demeanor/Rapport:   (Up beat ) Affect (typically observed):  Accepting,  Happy Orientation:  Oriented to Situation, Oriented to  Time, Oriented to Place, Oriented to Self Alcohol / Substance use:  Not Applicable Psych involvement (Current and /or in the community):  No (Comment)  Discharge Needs  Concerns to be addressed:  Denies Needs/Concerns at this time Readmission within the last 30 days:  No Current discharge risk:  None Barriers to Discharge:  No Barriers Identified   Melissa Marrin, LCSW 07/22/2015, 1:02 PM

## 2015-07-22 NOTE — Progress Notes (Addendum)
PT Cancellation Note  Patient Details Name: [name] MRN: [id number] DOB: May 29, 1946   Cancelled Treatment:    Reason Eval/Treat Not Completed: Patient declined, stating, "I'm not doing that today.  I have been doing business all morning.".  Pt reports to PT she is going today or tomorrow to a facility off of [address].  As of yesterday, pt had been refusing SNF and noted SW had signed off due to pt's refusal.  Pt stated she had called them herself and she was excited because she will have a private room and it is 5 stars.  Con't to recommend 24 hour S and SNF is a good d/c plan for patient  Called and informed SW of what pt had said.  Will check back as schedule permits to see if pt will be agreeable to work with PT.  Addendum:  Attempted a second time to see pt and she refused to work with PT citing wanting to get new medicine in her system for her BP.  Offered to A pt to chair for lunch, but pt continued to decline.  Santiago Glad L. Tamala Julian, [place] Pager [phone] 07/22/2015   [name] 07/22/2015, 9:43 AM

## 2015-07-22 NOTE — Progress Notes (Signed)
Nutrition Brief Note  Patient identified on the Malnutrition Screening Tool (MST) Report  Wt Readings from Last 15 Encounters:  07/22/15 178 lb (80.74 kg)  07/15/15 180 lb (81.647 kg)  07/09/15 180 lb (81.647 kg)  07/07/15 192 lb (87.091 kg)  06/30/15 180 lb (81.647 kg)  12/08/14 212 lb 11.9 oz (96.5 kg)  11/14/14 210 lb (95.255 kg)  01/20/14 211 lb (95.709 kg)  11/13/13 209 lb 7 oz (95 kg)  07/18/13 197 lb 12.8 oz (89.721 kg)  04/16/13 185 lb 12.8 oz (84.278 kg)  01/17/13 184 lb 3.2 oz (83.553 kg)  12/26/12 202 lb (91.627 kg)  12/16/12 203 lb (92.08 kg)  04/03/12 194 lb 8 oz (88.225 kg)    Body mass index is 27.07 kg/(m^2). Patient meets criteria for Overweight based on current BMI.   Current diet order is Heart Healthy/Carb Modidied, patient is consuming approximately 75-100% of meals at this time. She reports having a good appetite. Pt reports breaking her hip back in December. She is unsure what has cause her weight loss but, she reports eating well and having a good appetite PTA. She dislikes Ensure and does not feel she needs nutritional supplements. RD encouraged adequate intake with lean/low fat protein at each meal. Encouraged pt to keep track of her weight and eat more if weight loss is more than 1-2 lbs per week. Labs and medications reviewed.   No nutrition interventions warranted at this time. If nutrition issues arise, please consult RD.   Scarlette Ar RD, LDN Inpatient Clinical Dietitian Pager: [phone] After Hours Pager: [phone]

## 2015-07-22 NOTE — Progress Notes (Signed)
Occupational Therapy Treatment Patient Details Name: [name] MRN: [id number] DOB: 10/15/1946 Today's Date: 07/22/2015    History of present illness [name] is a 69yo woman with PMH of DM2, HTN, HLD, CAD s/p MI, h/o SVT on beta blocker, arthritis who presents after syncope and a fall. Incidentally, she had kyphoplasty of T11 2 days ago. [name] reports that she was walking to the refrigerator today and she developed a few minutes of lightheadedness and then passed out. She reports that she did not completely lose consciousness, but did have to lie on the floor for upwards of 6 hours due to not being able to get to the phone. She denies seizure like activity, loss of bladder or bowel. She reports that she thinks it was due to her sugar being low. She said that it was 70 in the ED and that is very low for her. She specifically denies palpitations, SOB, chest pain, headache.Found to have orthostatic hypotension during PT evaluation on 8/22   OT comments  Pt now [facility]able to [facility].  Pt very grateful for OT working with her.  Focus of session on toileting and LB dressing. Reinforced log roll technique for bed mobility and safety with RW.  Follow Up Recommendations  SNF;Supervision/Assistance - 24 hour    Equipment Recommendations  Tub/shower bench    Recommendations for Other Services      Precautions / Restrictions Precautions Precautions: Fall       Mobility Bed Mobility Overal bed mobility: Needs Assistance Bed Mobility: Rolling;Sidelying to Sit;Sit to Sidelying Rolling: Supervision Sidelying to sit: Supervision     Sit to sidelying: Supervision General bed mobility comments: supervision for log roll technique, did not require physical assist  Transfers Overall transfer level: Needs assistance Equipment used: Rolling walker (2 wheeled) Transfers: Sit to/from Stand Sit to Stand: Min guard;Min assist         General transfer comment: min from  bed, min guard fro 3 in 1    Balance     Sitting balance-Leahy Scale: Good       Standing balance-Leahy Scale: Poor                     ADL Overall ADL's : Needs assistance/impaired     Grooming: Set up;Sitting;Wash/dry hands;Brushing hair               Lower Body Dressing: Moderate assistance;Sit to/from stand   Toilet Transfer: Minimal assistance;Stand-pivot;BSC   Toileting- Clothing Manipulation and Hygiene: Minimal assistance;Sit to/from stand                Vision                     Perception     Praxis      Cognition   Behavior During Therapy: [facility] for tasks assessed/performed Overall Cognitive Status: No family/caregiver present to determine baseline cognitive functioning                       Extremity/Trunk Assessment               Exercises     Shoulder Instructions       General Comments      Pertinent Vitals/ Pain       Pain Assessment: No/denies pain  Home Living  Prior Functioning/Environment              Frequency Min 2X/week     Progress Toward Goals  OT Goals(current goals can now be found in the care plan section)  Progress towards OT goals: Progressing toward goals  Acute Rehab OT Goals Patient Stated Goal: Pt now agreeable to ST rehab in SNF. Time For Goal Achievement: 07/29/15  Plan Discharge plan remains appropriate    Co-evaluation                 End of Session Equipment Utilized During Treatment: Rolling walker   Activity Tolerance Patient tolerated treatment well   Patient Left in bed;with call bell/phone within reach;with bed alarm set   Nurse Communication  (ok to give graham crackers and pb)        Time: 1440-1515 OT Time Calculation (min): 35 min  Charges: OT General Charges $OT Visit: 1 Procedure OT Treatments $Self Care/Home Management : 23-37 mins  Malka So 07/22/2015,  3:24 PM  [phone]

## 2015-07-22 NOTE — Progress Notes (Addendum)
[facility] PROGRESS NOTE  [name] VEL:[id number] DOB: [birth year] DOA: 07/18/2015 PCP: Shirline Frees, MD  Brief narrative 69 year old female with history of hypertension, type 2 diabetes mellitus, CAD with history of MI, history of SVT on beta blocker, arthritis, hyperlipidemia with recent T11 kyphoplasty who presented after sustaining a fall at home and was on the floor for almost 6 hours not being able to get up. She reports feeling lightheaded and slumping on the ground but denies any loss of consciousness, seizure-like activity or loss of bladder /bowel control. He reports that her blood sugar was in the 70s in the ED which is quite low for her. In the ED patient was tachycardic, normal lab work and troponin. EKG was unremarkable. Chest x-ray showed minimal left pleural effusion which was unchanged. X-ray of the spine showed recent changes to T11 after kyphoplasty without any fractures. Head CT unremarkable. Patient admitted under observation. Orthostasis positive.    Assessment/Plan: Near syncope and collapse - secondary to orthostatic hypotension. -Improved with aggressive IV hydration. HR better after increasing metoprolol dose. Still quite orthostatic. Check am cortisol. Place on TED stocking and midodrine.    SVT Recently started on her blocker by cardiology during outpatient evaluation for cardiac clearance. HR better after increasing metoprolol dose. 2-D echo with normal EF. No wall motion abnormality.  Coronary artery disease Stable. Continue metoprolol and statin. Holding Lasix and ACE inhibitor.  Type 2 diabetes mellitus A1c of 5.3. I have discontinued all her medications (should not be continued upon discharge). Monitor on sliding scale insulin alone.  Diastolic CHF Currently euvolemic. Holding Lasix.  Hyperlipidemia Continue statin  DVT prophylaxis: Subcutaneous Lovenox  Diet: Heart healthy  Code Status: Full code Family Communication: None at  bedside  Disposition Plan: Patient agrees to go to skilled nursing facility. If orthostasis improves, wil d/c tomorrow  Consultants:  None  Procedures:  2 d echo  Head CT  Antibiotics:  NONE  HPI/Subjective: Patient seen and examined. Denies any symptoms. Heart rate improved. Still orthostatic  Objective: Filed Vitals:   07/22/15 0602  BP: 156/86  Pulse:   Temp:   Resp:     Intake/Output Summary (Last 24 hours) at 07/22/15 0909 Last data filed at 07/22/15 0815  Gross per 24 hour  Intake    890 ml  Output   2500 ml  Net  -1610 ml   Filed Weights   07/20/15 0536 07/21/15 0525 07/22/15 0544  Weight: 77.9 kg (171 lb 11.8 oz) 81.103 kg (178 lb 12.8 oz) 80.74 kg (178 lb)    Exam:  orthostasis +: 159/79 lying, 146/69 sitting and 80/59 on standing for 3 minutes.   General:   no acute distress  HEENT:  moist oral mucosa, supple neck  Chest: Clear to auscultation bilaterally  CVS: Normal S1 and S2, no murmurs rub or gallop  GI: Soft, nondistended, nontender, bowel sounds present  Musculoskeletal : Warm, no edema    Data Reviewed: Basic Metabolic Panel:  Recent Labs Lab 07/15/15 1012 07/18/15 1514 07/19/15 0229 07/19/15 1015 07/20/15 0310 07/21/15 0547 07/21/15 1138  NA 138 138 140  --  138  --  140  K 4.0 3.8 3.5  --  3.9  --  4.1  CL 102 101 103  --  99*  --  107  CO2 22 24 28   --  30  --  26  GLUCOSE 119* 113* 114*  --  94  --  146*  BUN 17 11 9   --  11  --  9  CREATININE 0.84 0.69 0.67  --  0.69  --  0.61  CALCIUM 9.5 9.1 8.9  --  9.0  --  8.6*  MG  --   --   --  1.3*  --  1.6*  --    Liver Function Tests:  Recent Labs Lab 07/15/15 1012  AST 25  ALT 16  ALKPHOS 91  BILITOT 1.2  PROT 7.7  ALBUMIN 3.4*   No results for input(s): LIPASE, AMYLASE in the last 168 hours. No results for input(s): AMMONIA in the last 168 hours. CBC:  Recent Labs Lab 07/15/15 1012 07/18/15 1514 07/19/15 0229  WBC 9.2 8.0 6.5  NEUTROABS 6.3 5.6  --    HGB 14.7 14.4 12.8  HCT 43.8 43.0 38.4  MCV 90.7 91.1 90.6  PLT 225 176 175   Cardiac Enzymes:  Recent Labs Lab 07/18/15 1514 07/18/15 2206 07/19/15 0229 07/19/15 1015  CKTOTAL 44  --   --   --   TROPONINI <0.03 <0.03 <0.03 <0.03   BNP (last 3 results)  Recent Labs  12/05/14 0145  BNP 76.4    ProBNP (last 3 results) No results for input(s): PROBNP in the last 8760 hours.  CBG:  Recent Labs Lab 07/21/15 1111 07/21/15 1554 07/21/15 1631 07/21/15 2136 07/22/15 0550  GLUCAP 131* 183* 121* 131* 129*    Recent Results (from the past 240 hour(s))  Surgical pcr screen     Status: None   Collection Time: 07/15/15 10:11 AM  Result Value Ref Range Status   MRSA, PCR NEGATIVE NEGATIVE Final   Staphylococcus aureus NEGATIVE NEGATIVE Final    Comment:        The Xpert SA Assay (FDA approved for NASAL specimens in patients over 85 years of age), is one component of a comprehensive surveillance program.  Test performance has been validated by [facility] for patients greater than or equal to 39 year old. It is not intended to diagnose infection nor to guide or monitor treatment.      Studies: No results found.  Scheduled Meds: . cholecalciferol  2,000 Units Oral Daily  . enoxaparin (LOVENOX) injection  40 mg Subcutaneous Q24H  . feeding supplement (ENSURE ENLIVE)  237 mL Oral BID BM  . insulin aspart  0-5 Units Subcutaneous QHS  . insulin aspart  0-9 Units Subcutaneous TID WC  . metoprolol tartrate  50 mg Oral BID  . midodrine  2.5 mg Oral BID WC  . multivitamin with minerals  1 tablet Oral q morning - 10a  . simvastatin  40 mg Oral QHS  . sodium chloride  3 mL Intravenous Q12H   Continuous Infusions:     Time spent: 20 minutes    Sonnia Strong, [place]  [facility] Pager [phone]. If 7PM-7AM, please contact night-coverage at [email or web address], password [facility] 07/22/2015, 9:09 AM

## 2015-07-23 DIAGNOSIS — R55 Syncope and collapse: Secondary | ICD-10-CM

## 2015-07-23 DIAGNOSIS — R000 Tachycardia, unspecified: Secondary | ICD-10-CM | POA: Diagnosis not present

## 2015-07-23 DIAGNOSIS — I951 Orthostatic hypotension: Secondary | ICD-10-CM | POA: Diagnosis not present

## 2015-07-23 LAB — GLUCOSE, CAPILLARY
GLUCOSE-CAPILLARY: 187 mg/dL — AB (ref 65–99)
Glucose-Capillary: 133 mg/dL — ABNORMAL HIGH (ref 65–99)

## 2015-07-23 LAB — CORTISOL: Cortisol, Plasma: 10.8 ug/dL

## 2015-07-23 MED ORDER — POLYETHYLENE GLYCOL 3350 17 G PO PACK
17.0000 g | PACK | Freq: Once | ORAL | Status: AC
  Administered 2015-07-23: 17 g via ORAL
  Filled 2015-07-23: qty 1

## 2015-07-23 MED ORDER — MIDODRINE HCL 2.5 MG PO TABS: 2.5000 mg | ORAL_TABLET | Freq: Two times a day (BID) | ORAL | Status: DC

## 2015-07-23 MED ORDER — ENSURE ENLIVE PO LIQD: 237.0000 mL | Freq: Two times a day (BID) | ORAL | Status: DC

## 2015-07-23 MED ORDER — METOPROLOL TARTRATE 50 MG PO TABS: 50.0000 mg | ORAL_TABLET | Freq: Two times a day (BID) | ORAL | Status: DC

## 2015-07-23 NOTE — Progress Notes (Signed)
CSW (Clinical Education officer, museum) visited pt room and confirmed pt is expecting to dc to [facility]. Facility has confirmed they are able to accept pt. Pt plans to ride with her friend to the facility who is already here at bedside with pt. CSW prepared pt dc packet and provided to pt and notified pt this should be given to facility. CSW notified pt nurse of transportation plan and provided with number to call for report. Facility aware pt will arrive by car. CSW signing off.  [place], [place]

## 2015-07-23 NOTE — Discharge Summary (Signed)
Physician Discharge Summary  [name] QAS:[id number] DOB: 1946-09-27 DOA: 07/18/2015  PCP: Shirline Frees, MD  Admit date: 07/18/2015 Discharge date: 07/23/2015  Time spent: <30 minutes  Recommendations for Outpatient Follow-up:  1. Discharged to skilled nursing facility. 2. Please monitor orthostatic vitals. Please adjust blood pressure medications and midodrine accordingly. 3. Patient has been taken off her oral hypoglycemics and insulin due to low fsg on presentation and low A1C.  Discharge Diagnoses:  Principal Problem:   Orthostatic hypotension   Active Problems:   SVT (supraventricular tachycardia)   Coronary atherosclerosis of native coronary artery   HTN (hypertension)   Type 2 diabetes mellitus without complication   Diastolic congestive heart failure   Near Syncope and collapse   Discharge Condition: Fair  Diet recommendation: Heart healthy/diabetic  Filed Weights   07/21/15 0525 07/22/15 0544 07/23/15 0450  Weight: 81.103 kg (178 lb 12.8 oz) 80.74 kg (178 lb) 80.06 kg (176 lb 8 oz)    History of present illness:  Please refer to admission H&P for details, in brief, 69 year old female with history of hypertension, type 2 diabetes mellitus, CAD with history of MI, history of SVT on beta blocker, arthritis, hyperlipidemia with recent T11 kyphoplasty who presented after sustaining a fall at home and was on the floor for almost 6 hours not being able to get up. She reports feeling lightheaded and slumping on the ground but denies any loss of consciousness, seizure-like activity or loss of bladder /bowel control. He reports that her blood sugar was in the 70s in the ED which is quite low for her. In the ED patient was tachycardic, normal lab work and troponin. EKG was unremarkable. Chest x-ray showed minimal left pleural effusion which was unchanged. X-ray of the spine showed recent changes to T11 after kyphoplasty without any fractures. Head CT  unremarkable. Patient admitted under observation. She had significant orthostatic hypotension.  Hospital Course:  Near syncope and collapse secondary to severe orthostatic hypotension -Improving with IV hydration and holding blood pressure medications. Added low dose midodrine with further improvement. Patient is still orthostatic with her systolic blood pressure (systolic 798 lying and 921 standing after 3 minutes), but her diastolic blood pressure and heart rate remained stable..  -HR better after increasing metoprolol dose. Still quite orthostatic. Normal am cortisol.  -Patient seen by physical therapy and recommended skilled nursing facility. -Place on TED stocking and midodrine upon discharge and monitor orthostasis at the facility.     SVT Recently started on her blocker by cardiology during outpatient evaluation for cardiac clearance. HR stable after increasing metoprolol dose. 2-D echo with normal EF. No wall motion abnormality.  Coronary artery disease Stable. Continue metoprolol and statin. Holding Lasix and ACE inhibitor.  Type 2 diabetes mellitus A1c of 5.3. I have discontinued all her medications (oral hypoglycemics and Lantus)  Diastolic CHF Currently euvolemic. Holding Lasix.  Hyperlipidemia Continue statin     Code Status: Full code Family Communication: None at bedside  Disposition Plan: Skilled nursing facility  Consultants:  None  Procedures:  2 d echo  Head CT  Antibiotics:  NONE    Discharge Exam: Filed Vitals:   07/23/15 0849  BP: 127/68  Pulse: 65  Temp: 98.3 F (36.8 C)  Resp: 18    General: Elderly female not in distress HEENT: No pallor, moist oral mucosa, supple neck Chest: Clear to auscultation bilaterally CVS: Normal S1 and S2, no murmurs rub or gallop GI: Soft, nondistended, nontender, bowel sounds present Musculoskeletal: Warm, no edema  CNS: Alert and oriented   Discharge Instructions    Current Discharge  Medication List    START taking these medications   Details  feeding supplement, ENSURE ENLIVE, (ENSURE ENLIVE) LIQD Take 237 mLs by mouth 2 (two) times daily between meals. Qty: 237 mL, Refills: 12    midodrine (PROAMATINE) 2.5 MG tablet Take 1 tablet (2.5 mg total) by mouth 2 (two) times daily with a meal. Qty: 60 tablet, Refills: 0      CONTINUE these medications which have CHANGED   Details  metoprolol tartrate (LOPRESSOR) 50 MG tablet Take 1 tablet (50 mg total) by mouth 2 (two) times daily. Qty: 60 tablet, Refills: 0   Associated Diagnoses: Atherosclerosis of native coronary artery of native heart without angina pectoris      CONTINUE these medications which have NOT CHANGED   Details  Cholecalciferol (VITAMIN D3) 1000 UNITS CAPS Take 2,000 Units by mouth every morning.     docusate sodium (COLACE) 100 MG capsule Take 1 capsule (100 mg total) by mouth daily as needed for mild constipation. Qty: 60 capsule, Refills: 0    Multiple Vitamin (MULITIVITAMIN WITH MINERALS) TABS Take 1 tablet by mouth every morning.     nitroGLYCERIN (NITROSTAT) 0.4 MG SL tablet Place 1 tablet (0.4 mg total) under the tongue every 5 (five) minutes as needed for chest pain. Qty: 25 tablet, Refills: 3    ondansetron (ZOFRAN ODT) 4 MG disintegrating tablet Take 1 tablet (4 mg total) by mouth every 8 (eight) hours as needed for nausea or vomiting. Qty: 20 tablet, Refills: 0    simvastatin (ZOCOR) 40 MG tablet Take 40 mg by mouth at bedtime.      STOP taking these medications     furosemide (LASIX) 40 MG tablet      insulin glargine (LANTUS) 100 UNIT/ML injection      metFORMIN (GLUCOPHAGE) 1000 MG tablet      pioglitazone (ACTOS) 15 MG tablet      ramipril (ALTACE) 10 MG capsule        Allergies  Allergen Reactions  . Oxycodone Nausea Only   Follow-up Information    Please follow up.   Why:  MD at SNF       The results of significant diagnostics from this hospitalization  (including imaging, microbiology, ancillary and laboratory) are listed below for reference.    Significant Diagnostic Studies: Dg Chest 1 View  07/18/2015   CLINICAL DATA:  Status post fall today.  Right hip and back pain.  EXAM: CHEST  1 VIEW  COMPARISON:  June 28, 2015, November 14, 2014  FINDINGS: The heart size and mediastinal contours are within normal limits. There is minimal left pleural effusion versus pleural thickening unchanged compared to prior chest x-ray of July 2016. There is no focal pneumonia or pulmonary edema. Patient status post prior vertebroplasty of thoracic vertebral bodies.  IMPRESSION: Minimal left pleural effusion versus pleural thickening unchanged compared to prior chest x-ray of July 2016. Otherwise no acute abnormality identified.   Electronically Signed   By: Abelardo Diesel M.D.   On: 07/18/2015 16:52   Dg Chest 1 View  06/28/2015   CLINICAL DATA:  Vomiting, lower abdominal pain.  EXAM: CHEST  1 VIEW  COMPARISON:  11/14/2014  FINDINGS: Mild hyperinflation of the lungs. Mild cardiomegaly. Lungs are clear. No effusions. No acute bony abnormality.  IMPRESSION: Mild hyperinflation and cardiomegaly.  No active disease.   Electronically Signed   By: Rolm Baptise M.D.  On: 06/28/2015 13:27   Dg Thoracic Spine 2 View  07/18/2015   CLINICAL DATA:  69 year old female with a history of thoracic pain.  EXAM: THORACIC SPINE 2 VIEWS  COMPARISON:  MRI 06/30/2015  FINDINGS: Thoracic Spine:  Thoracic vertebral elements maintain normal anatomic alignment, with no evidence of subluxation.  Alignment of the thoracic vertebral elements similar to that of the prior MRI, with accentuated kyphotic curvature.  Interval changes of T11 vertebral augmentation.  Similar appearance of T8 vertebral augmentation, present on prior MRI.  Re- demonstration of T7 wedge compression fracture, not significantly changed from the comparison MRI.  No fracture line identified.  Diffuse osteopenia.  Changes of  degenerative disc disease.  No acute fracture line identified.  IMPRESSION: Negative for acute fracture or malalignment of the thoracic spine, with unchanged configuration of vertebral bodies, including known T7 wedge compression fracture.  Interval changes of T11 vertebral augmentation, with similar appearance of T8 vertebral augmentation.  Osteopenia.  Signed,  Dulcy Fanny. Earleen Newport, DO  [facility]  [facility]   Electronically Signed   By: Corrie Mckusick D.O.   On: 07/18/2015 16:51   Dg Lumbar Spine Complete  07/18/2015   CLINICAL DATA:  69 year old female with a history of lumbar back pain  EXAM: LUMBAR SPINE - COMPLETE 4+ VIEW  COMPARISON:  CT 06/28/2015  FINDINGS: Lumbar Spine:  Lumbar vertebral elements maintain normal alignment without evidence of subluxation.  No fracture line identified. Surgical changes of prior T11 vertebral augmentation, new from the comparison CT dated 06/28/2015.  Vertebral body heights maintained, unchanged from comparison CT.  Osteopenia.  Multilevel facet changes, most pronounced at the L4-L5 and L5-S1 level.  Oblique images demonstrate no evidence of displaced pars defect.  Atherosclerosis.  Unremarkable appearance of the visualized abdomen.  IMPRESSION: No acute fracture or malalignment of the lumbar spine.  Interval changes of T11 vertebral augmentation.  Osteopenia.  Signed,  Dulcy Fanny. Earleen Newport, DO  [facility]  [facility]   Electronically Signed   By: Corrie Mckusick D.O.   On: 07/18/2015 16:48   Dg Lumbar Spine Complete  07/15/2015   CLINICAL DATA:  Compression fracture of T11.  EXAM: LUMBAR SPINE - COMPLETE 4+ VIEW; DG C-ARM 1-60 MIN - NRPT [facility]  COMPARISON:  MRI dated 06/30/2015  FINDINGS: AP and lateral C-arm images demonstrate the patient undergoing kyphoplasty at T11.  IMPRESSION: Kyphoplasty performed at T11.   Electronically Signed   By: Lorriane Shire M.D.   On: 07/15/2015 14:21    Dg Pelvis 1-2 Views  07/18/2015   CLINICAL DATA:  Right sided pelvic pain and right posterior hip pain after a fall at home from a standing height on to a lamina for at 0400 hr earlier today. Patient was unable to get up on her own from the floor. Initial encounter. Prior left femoral neck fracture in December, 2015.  EXAM: PELVIS - 1-2 VIEW  COMPARISON:  AP pelvis x-ray 12/18/1,015. Bone window images from CT pelvis 06/28/2015.  FINDINGS: No evidence of acute fracture. Both hip joints intact with well preserved joint spaces for age. Generalized osseous demineralization. Sacroiliac joints and symphysis pubis intact. Prior ORIF of a left femoral neck fracture with as yet incomplete healing.  IMPRESSION: 1. No acute osseous abnormality. 2. Osseous demineralization. 3. ORIF of a prior left femoral neck fracture with incomplete healing.   Electronically Signed   By: Evangeline Dakin M.D.   On: 07/18/2015 16:52   Ct Head  Wo Contrast  07/18/2015   CLINICAL DATA:  Status post fall today hit the back of the head. The patient states she is dizzy.  EXAM: CT HEAD WITHOUT CONTRAST  TECHNIQUE: Contiguous axial images were obtained from the base of the skull through the vertex without intravenous contrast.  COMPARISON:  November 12, 2013  FINDINGS: There is chronic diffuse atrophy. Chronic bilateral periventricular white matter small vessel ischemic changes identified. There is no midline shift, hydrocephalus. The previously noted sub cm fatty mass dorsal to the tectum is unchanged. No acute hemorrhage or acute transcortical infarct is identified. The bony calvarium is intact. The visualized sinuses are clear.  IMPRESSION: No focal acute intracranial abnormality identified.   Electronically Signed   By: Abelardo Diesel M.D.   On: 07/18/2015 16:47   Mr Thoracic Spine Wo Contrast  06/30/2015   CLINICAL DATA:  Increased mid back pain for 2 weeks. History of kyphoplasty. Kyphoplasty at T8. Interval compression fracture noted  06/28/2015.  EXAM: MRI THORACIC SPINE WITHOUT CONTRAST  TECHNIQUE: Multiplanar, multisequence MR imaging of the thoracic spine was performed. No intravenous contrast was administered.  COMPARISON:  MRI thoracic spine 12/12/2012.  CT 06/28/2015.  FINDINGS: Segmentation: Numbering used on prior exam was preserved. Counting was performed from the craniocervical junction. T8 vertebral augmentation and T11 compression fractures used as reference.  Alignment: Increased thoracic kyphosis associated with chronic T7 and T8 compression fractures.  Vertebrae: Chronic T7 compression fracture has increased compared to the prior MRI from 2014. This shows about 50% maximal loss of vertebral body height with no retropulsion. In conjunction with the T8 compression fracture with vertebral augmentation, this produces most of the kyphosis. T8 shows mild retropulsion.  Acute/ subacute T11 compression fracture is present with about 20% loss of vertebral body height. The compression fracture is biconcave but predominantly involves superior surface. Retropulsion measures 4 mm which is concordant with the prior CT. No interval increased loss of height. The T11 compression fracture shows low signal centrally which probably represents trabecular impaction rather than a underlying destructive lesion. This vertebra appear normal on the prior exam 2014.  Cord: No cord edema or intramedullary lesion. Angulation of the cord associated with kyphosis at T7-T8. This also produces flattening of the cord.  Paraspinal tissues: Atelectasis in the dependent portions of the lungs. Bilateral renal scarring is present.  Disc levels:  No thoracic spine disc protrusions or stenosis from T1-T2 through T6-T7. Age expected disc desiccation.  T7-T8 shows mild central stenosis associated with retropulsion of the T8 vertebra which is chronic. Effacement of the ventral subarachnoid space and flattening of the ventral cord eccentric to the RIGHT. The neural foramina  appear patent.  T8-T9: RIGHT foraminal stenosis associated with retropulsion and compression fracture is mild. Central canal appears adequately patent.  T9-T10:  Negative.  T10-T11: Bilateral facet arthrosis producing mild to moderate central stenosis. The stenosis is in conjunction with retropulsion of the vertebra below. Flattening of the posterior aspect of the cord associated with facet arthrosis and ligamentum flavum redundancy.  T11-T12:  Facet arthrosis without stenosis.  T12-L1:  Negative.  IMPRESSION: 1. Acute or subacute T11 compression fracture with 20% loss of vertebral body height. Mild retropulsion measuring 4 mm producing mild to moderate central stenosis in conjunction with facet hypertrophy and ligamentum flavum redundancy at T10-T11. 2. Chronic T8 compression fracture with vertebral augmentation. 3. New T7 compression fracture since the prior MRI. In concert with the compression fracture at T8, this produces a thoracic kyphosis centered at the  T7-T8 disc space which angulates the cord.   Electronically Signed   By: Dereck Ligas M.D.   On: 06/30/2015 16:23   Ct Abdomen Pelvis W Contrast  06/28/2015   CLINICAL DATA:  Generalized fatigue and lower abdominal pain. Decreased appetite for several days. History of chronic back pain.  EXAM: CT ABDOMEN AND PELVIS WITH CONTRAST  TECHNIQUE: Multidetector CT imaging of the abdomen and pelvis was performed using the standard protocol following bolus administration of intravenous contrast.  CONTRAST:  100 cc Omnipaque 300  COMPARISON:  CT abdomen pelvis - 02/05/2015; thoracic spine radiographs -06/22/2015  FINDINGS: Normal hepatic contour. No discrete hepatic lesions. Normal appearance of the gallbladder. No radiopaque gallstones. No intra or extrahepatic biliary duct dilatation. No ascites.  There is symmetric enhancement and excretion of the bilateral kidneys. The right kidney remains atrophic in comparison to the left. Mild fetal lobulation is noted of  the bilateral kidneys. No urinary obstruction or perinephric stranding. No definite renal stones this postcontrast examination. No discrete renal lesions. Normal appearance of the bilateral adrenal glands, pancreas and spleen.  Large colonic stool burden without evidence of enteric obstruction. Colonic diverticulosis without evidence of diverticulitis. The bowel is normal in course and caliber without wall thickening or evidence of obstruction. Normal appearance of the appendix. No pneumoperitoneum, pneumatosis or portal venous gas.  Scattered mixed calcified and noncalcified atherosclerotic plaque within a normal caliber abdominal aorta. The major branch vessels of the abdominal aorta appear patent on this non CTA examination. 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  Note is made of an approximately 3.4 x 2.4 cm hypo attenuating (8 Hounsfield unit) left-sided adnexal cyst. No discrete right-sided adnexal lesions. Normal appearance of the urinary bladder given degree distention. No free fluid in the pelvic cul-de-sac.  Limited visualization of the lower thorax demonstrates minimal subsegmental atelectasis within the imaged caudal aspects of the right middle lobe and lingula. Minimal dependent subpleural ground-glass atelectasis. No focal airspace opacities. No pleural effusion.  Cardiomegaly. Coronary artery calcifications. No pericardial effusion.  There is a acute mild (under 25%) compression deformity involving the superior endplate of the Y85 vertebral body (sagittal image 82, series 204) with minimal (approximately 4 mm) of retropulsion of the posterior aspect of the superior endplate towards the spinal canal (sagittal image 83, series 204; axial image 12, series 201).  Post dynamic screw fixation of the left femoral neck with intra medullary rod fixation of the proximal femur.  Regional soft tissues appear normal.  IMPRESSION: 1.  Acute mild (under 25%) compression deformity involving the superior endplate of the O27 vertebral body with minimal (approximately 4 mm) retropulsion, similar to thoracic spine radiographs performed 06/22/2015 though new since the abdominal CT performed 02/05/2015. Correlation for point tenderness at this location is recommended. Further evaluation with MRI could be performed as clinically indicated. 2. Large colonic stool burden without evidence of enteric obstruction. 3. Colonic diverticulosis without evidence of diverticulitis. 4. Similar findings of asymmetric atrophy of the right kidney in comparison to the left. No evidence of urinary obstruction. 5. Grossly unchanged approximately 3.4 cm left-sided cystic adnexal lesion - while an abnormal finding in this postmenopausal patient, given lack of interval change since the 01/2015 examination, this is likely of benign etiology.   Electronically Signed   By: Sandi Mariscal M.D.   On: 06/28/2015 14:33   Dg C-arm 1-60 Min-no Report  07/15/2015   CLINICAL DATA: surgery   C-ARM 1-60  MINUTES  Fluoroscopy was utilized by the requesting physician.  No radiographic  interpretation.     Microbiology: Recent Results (from the past 240 hour(s))  Surgical pcr screen     Status: None   Collection Time: 07/15/15 10:11 AM  Result Value Ref Range Status   MRSA, PCR NEGATIVE NEGATIVE Final   Staphylococcus aureus NEGATIVE NEGATIVE Final    Comment:        The Xpert SA Assay (FDA approved for NASAL specimens in patients over 77 years of age), is one component of a comprehensive surveillance program.  Test performance has been validated by [facility] for patients greater than or equal to 68 year old. It is not intended to diagnose infection nor to guide or monitor treatment.      Labs: Basic Metabolic Panel:  Recent Labs Lab 07/18/15 1514 07/19/15 0229 07/19/15 1015 07/20/15 0310 07/21/15 0547 07/21/15 1138  NA 138 140  --  138  --  140  K 3.8  3.5  --  3.9  --  4.1  CL 101 103  --  99*  --  107  CO2 24 28  --  30  --  26  GLUCOSE 113* 114*  --  94  --  146*  BUN 11 9  --  11  --  9  CREATININE 0.69 0.67  --  0.69  --  0.61  CALCIUM 9.1 8.9  --  9.0  --  8.6*  MG  --   --  1.3*  --  1.6*  --    Liver Function Tests: No results for input(s): AST, ALT, ALKPHOS, BILITOT, PROT, ALBUMIN in the last 168 hours. No results for input(s): LIPASE, AMYLASE in the last 168 hours. No results for input(s): AMMONIA in the last 168 hours. CBC:  Recent Labs Lab 07/18/15 1514 07/19/15 0229  WBC 8.0 6.5  NEUTROABS 5.6  --   HGB 14.4 12.8  HCT 43.0 38.4  MCV 91.1 90.6  PLT 176 175   Cardiac Enzymes:  Recent Labs Lab 07/18/15 1514 07/18/15 2206 07/19/15 0229 07/19/15 1015  CKTOTAL 44  --   --   --   TROPONINI <0.03 <0.03 <0.03 <0.03   BNP: BNP (last 3 results)  Recent Labs  12/05/14 0145  BNP 76.4    ProBNP (last 3 results) No results for input(s): PROBNP in the last 8760 hours.  CBG:  Recent Labs Lab 07/22/15 0550 07/22/15 1137 07/22/15 1632 07/22/15 2052 07/23/15 0656  GLUCAP 129* 159* 151* 108* 133*       Signed:  Kaiden Dardis  [facility] 07/23/2015, 9:58 AM

## 2015-07-23 NOTE — Clinical Social Work Placement (Signed)
   CLINICAL SOCIAL WORK PLACEMENT  NOTE  Date:  07/23/2015  Patient Details  Name: [name] MRN: [id number] Date of Birth: 06/10/1946  Clinical Social Work is seeking post-discharge placement for this patient at the [place] level of care (*CSW will initial, date and re-position this form in  chart as items are completed):  Yes   Patient/family provided with [facility] Work Department's list of facilities offering this level of care within the geographic area requested by the patient (or if unable, by the patient's family).  Yes   Patient/family informed of their freedom to choose among providers that offer the needed level of care, that participate in Medicare, Medicaid or managed care program needed by the patient, have an available bed and are willing to accept the patient.  Yes   Patient/family informed of [facility]'s ownership interest in [facility] and [facility], as well as of the fact that they are under no obligation to receive care at these facilities.  PASRR submitted to EDS on       PASRR number received on       Existing PASRR number confirmed on 07/22/15     FL2 transmitted to all facilities in geographic area requested by pt/family on 07/22/15     FL2 transmitted to all facilities within larger geographic area on       Patient informed that his/her managed care company has contracts with or will negotiate with certain facilities, including the following:        Yes   Patient/family informed of bed offers received.  Patient chooses bed at [facility]     Physician recommends and patient chooses bed at      Patient to be transferred to [facility] on 07/23/15.  Patient to be transferred to facility by [facility] (friend)     Patient family notified on 07/23/15 of transfer.  Name of family member notified:  Pt and friend at bedside notified. Pt declined for CSW to call anyone else.     PHYSICIAN      Additional Comment:    _______________________________________________ Berton Mount, [place]

## 2015-07-23 NOTE — Progress Notes (Signed)
Pt discharged to [facility]. Report had been called to nurse [employer].  A friend will be taking pt to the rehab facility.

## 2015-07-23 NOTE — Discharge Instructions (Signed)

## 2015-07-23 NOTE — Progress Notes (Signed)
Physical Therapy Treatment Patient Details Name: [name] MRN: [id number] DOB: [birth year] Today's Date: 07/23/2015    History of Present Illness [name] is a 69yo woman with PMH of DM2, HTN, HLD, CAD s/p MI, h/o SVT on beta blocker, arthritis who presents after syncope and a fall. Incidentally, she had kyphoplasty of T11 8/18. [name] reports that she was walking to the refrigerator today and she developed a few minutes of lightheadedness and then passed out. She reports that she did not completely lose consciousness, but did have to lie on the floor for upwards of 6 hours due to not being able to get to the phone. She denies seizure like activity, loss of bladder or bowel. She reports that she thinks it was due to her sugar being low. She said that it was 70 in the ED and that is very low for her. She specifically denies palpitations, SOB, chest pain, headache.Found to have orthostatic hypotension during PT evaluation on 8/22    PT Comments    Pt moving well with cues for sequence, precautions and back safety. Pt able to ambulate in room but denied further mobility at this time. Pt also denied having back precaution handout provided to her stating she will get all the therapy she needs at SNF. Will continue to follow.   Follow Up Recommendations  Supervision/Assistance - 24 hour;Other (comment)     Equipment Recommendations       Recommendations for Other Services       Precautions / Restrictions Precautions Precautions: Fall    Mobility  Bed Mobility Overal bed mobility: Needs Assistance   Rolling: Supervision Sidelying to sit: Supervision       General bed mobility comments: cues for log rolling and safety secondary to kyphoplasty  Transfers Overall transfer level: Needs assistance   Transfers: Sit to/from Stand Sit to Stand: Supervision         General transfer comment: cues for hand placement and posture  Ambulation/Gait Ambulation/Gait  assistance: Min guard Ambulation Distance (Feet): 35 Feet Assistive device: Rolling walker (2 wheeled) Gait Pattern/deviations: Step-through pattern;Decreased stride length   Gait velocity interpretation: Below normal speed for age/gender General Gait Details: cues for posture, looking up and position in RW   Stairs            Wheelchair Mobility    Modified Rankin (Stroke Patients Only)       Balance     Sitting balance-Leahy Scale: Good       Standing balance-Leahy Scale: Poor                      Cognition Arousal/Alertness: Awake/alert Behavior During Therapy: WFL for tasks assessed/performed Overall Cognitive Status: No family/caregiver present to determine baseline cognitive functioning       Memory: Decreased recall of precautions              Exercises      General Comments        Pertinent Vitals/Pain Pain Assessment: No/denies pain    Home Living                      Prior Function            PT Goals (current goals can now be found in the care plan section) Progress towards PT goals: Progressing toward goals    Frequency       PT Plan Current plan remains appropriate    Co-evaluation  End of Session   Activity Tolerance: Patient tolerated treatment well Patient left: in chair;with call bell/phone within reach     Time: 1055-1106 PT Time Calculation (min) (ACUTE ONLY): 11 min  Charges:  $Gait Training: 8-22 mins                    G Codes:      Melford Aase 08-18-2015, 11:20 AM Elwyn Reach, [place]

## 2015-08-06 ENCOUNTER — Telehealth: Payer: Self-pay | Admitting: Cardiology

## 2015-08-06 NOTE — Telephone Encounter (Signed)
Pt wants to know if she should be taking 25 mg or 50 mg of Metoprolol.

## 2015-08-06 NOTE — Telephone Encounter (Signed)
Pt had med questions d/t changes made in hospital. Dose clarified w/ patient. She verbalized understanding.

## 2015-08-31 ENCOUNTER — Ambulatory Visit: Payer: Medicare Other | Admitting: Cardiology

## 2015-09-08 ENCOUNTER — Other Ambulatory Visit: Payer: Self-pay | Admitting: Orthopedic Surgery

## 2015-09-14 ENCOUNTER — Other Ambulatory Visit: Payer: Self-pay

## 2015-09-14 DIAGNOSIS — I2510 Atherosclerotic heart disease of native coronary artery without angina pectoris: Secondary | ICD-10-CM

## 2015-09-14 MED ORDER — METOPROLOL TARTRATE 50 MG PO TABS: 50.0000 mg | ORAL_TABLET | Freq: Two times a day (BID) | ORAL | Status: DC

## 2015-09-14 NOTE — Telephone Encounter (Signed)
Rx(s) sent to pharmacy electronically.  

## 2015-09-16 ENCOUNTER — Encounter (HOSPITAL_COMMUNITY): Payer: Self-pay | Admitting: *Deleted

## 2015-09-16 NOTE — H&P (Signed)
PREOPERATIVE H&P  Chief Complaint: mid back pain  HPI: [name] is a 69 y.o. female who presents with ongoing pain in the mid back  MRI reveals edema at T12 c/w a compression fracture  Patient has failed multiple forms of conservative care and continues to have pain (see office notes for additional details regarding the patient's full course of treatment)  Past Medical History  Diagnosis Date  . Hypertension   . Hypercholesteremia   . CAD (coronary artery disease)     a. NSTEMI in setting of UTI and SVT 12/2012 => LHC 01/08/13: Proximal LAD 30%, ostial diagonal 30-40%, proximal RCA 95%, inferior AK, EF 45-50% => PCI: Promus DES x 2 to RCA;  b. Echo 01/05/13: EF 47-42%, grade 1 diastolic dysfunction, MAC   . SVT (supraventricular tachycardia) ([place])   . Rectal ulceration     colonoscopy 12/2012  . Anxiety     Due to current back pain and has to lie flat.  . Arthritis   . History of blood transfusion   . Myocardial infarction ([place]) 2014  . Type II diabetes mellitus ([place])   . Compression fracture     thoracic 12  . Edema    Past Surgical History  Procedure Laterality Date  . Eye surgery Bilateral "many"  . Tubal ligation    . Kyphoplasty  12/27/2012    Procedure: KYPHOPLASTY;  Surgeon: Sinclair Ship, MD;  Location: [place];  Service: Orthopedics;  Laterality: Bilateral;  T-8 kyphoplasty  . Colonoscopy Left 01/12/2013    Procedure: COLONOSCOPY;  Surgeon: Wonda Horner, MD;  Location: [facility] ENDOSCOPY;  Service: Endoscopy;  Laterality: Left;  . Colonoscopy N/A 01/14/2013    Procedure: COLONOSCOPY;  Surgeon: Wonda Horner, MD;  Location: [facility] ENDOSCOPY;  Service: Endoscopy;  Laterality: N/A;  Rm 2034, attemped Colon on 2-15=inadequate prep  . Coronary angioplasty  12/2012  . Left heart catheterization with coronary angiogram N/A 01/08/2013    Procedure: LEFT HEART CATHETERIZATION WITH CORONARY ANGIOGRAM;  Surgeon: Thayer Headings, MD;  Location[facility] CATH LAB;  Service:  Cardiovascular;  Laterality: N/A;  . Percutaneous stent intervention N/A 01/15/2013    Procedure: PERCUTANEOUS STENT INTERVENTION;  Surgeon: Peter M Martinique, MD;  Location: [facility] CATH LAB;  Service: Cardiovascular;  Laterality: N/A;  . Orif hip fracture Left 11/15/2014    Procedure: OPEN REDUCTION INTERNAL FIXATION HIP INTERTROCH;  Surgeon: Mcarthur Rossetti, MD;  Location: [place];  Service: Orthopedics;  Laterality: Left;  Marland Kitchen Kyphoplasty N/A 07/15/2015    Procedure: T11 KYPHOPLASTY;  Surgeon: Phylliss Bob, MD;  Location: [place];  Service: Orthopedics;  Laterality: N/A;  T11 kyphoplasty  . Cataract extraction, bilateral Bilateral    Social History   Social History  . Marital Status: Widowed    Spouse Name: N/A  . Number of Children: N/A  . Years of Education: N/A   Social History Main Topics  . Smoking status: Never Smoker   . Smokeless tobacco: Never Used  . Alcohol Use: No  . Drug Use: No  . Sexual Activity: Yes   Other Topics Concern  . None   Social History Narrative   Family History  Problem Relation Age of Onset  . Hypertension Brother    Allergies  Allergen Reactions  . Oxycodone Nausea Only   Prior to Admission medications   Medication Sig Start Date End Date Taking? Authorizing Provider  Cholecalciferol (VITAMIN D3) 1000 UNITS CAPS Take 2,000 Units by mouth every morning.  Historical Provider, MD  docusate sodium (COLACE) 100 MG capsule Take 1 capsule (100 mg total) by mouth daily as needed for mild constipation. 06/22/15   Pamella Pert, MD  feeding supplement, ENSURE ENLIVE, (ENSURE ENLIVE) LIQD Take 237 mLs by mouth 2 (two) times daily between meals. 07/23/15   Nishant Dhungel, MD  metoprolol (LOPRESSOR) 50 MG tablet Take 1 tablet (50 mg total) by mouth 2 (two) times daily. 09/14/15   Minus Breeding, MD  midodrine (PROAMATINE) 2.5 MG tablet Take 1 tablet (2.5 mg total) by mouth 2 (two) times daily with a meal. 07/23/15   Nishant Dhungel, MD  Multiple Vitamin  (MULITIVITAMIN WITH MINERALS) TABS Take 1 tablet by mouth every morning.     Historical Provider, MD  nitroGLYCERIN (NITROSTAT) 0.4 MG SL tablet Place 1 tablet (0.4 mg total) under the tongue every 5 (five) minutes as needed for chest pain. 04/16/13   Liliane Shi, PA-C  ondansetron (ZOFRAN ODT) 4 MG disintegrating tablet Take 1 tablet (4 mg total) by mouth every 8 (eight) hours as needed for nausea or vomiting. 06/28/15   Jule Ser, DO  simvastatin (ZOCOR) 40 MG tablet Take 40 mg by mouth at bedtime.    Historical Provider, MD     All other systems have been reviewed and were otherwise negative with the exception of those mentioned in the HPI and as above.  Physical Exam: There were no vitals filed for this visit.  General: Alert, no acute distress Cardiovascular: No pedal edema Respiratory: No cyanosis, no use of accessory musculature Skin: No lesions in the area of chief complaint Neurologic: Sensation intact distally Psychiatric: Patient is competent for consent with normal mood and affect Lymphatic: No axillary or cervical lymphadenopathy  MUSCULOSKELETAL: + TTP mid back  Assessment/Plan: T 12 compression fracture Plan for Procedure(s): T12 kyphoplasty   Sinclair Ship, MD 09/16/2015 4:51 PM

## 2015-09-16 NOTE — Progress Notes (Signed)
Spoke with [name] at MD's office to make MD aware that pt takes 10 units of Lantus at bedtime ( midnight) . [name] stated that pt is to be made aware to take only 8 units of Lantus. Pt verbalized understanding.

## 2015-09-16 NOTE — Progress Notes (Signed)
Anesthesia follow-up: SAME DAY WORK-UP.  Patient is a 69 year old female scheduled for T12 kyphoplasty tomorrow by Dr. Lynann Bologna. She is s/p T11 kyphoplasty 07/15/15, and was required to get cardiac clearance prior to that procedure. She saw Dr. Percival Spanish (see previous anesthesia notes by Jonah Blue, FNP-BC from 07/07/15 and by me on 07/13/15). In the interim she was hospitalized from 820/16-8/25/16 for syncope felt to be related to orthostatic hypotension. She was treated with IVF and started on midodrine. Lasix and lisinopril were held. She remained on b-blocker therapy since it had recently been started by cardiology due to SVT and CAD history.   PMH includes: CAD (NSTEMI in setting of UTI and SVT 12/2012 => LHC 01/08/13: Proximal LAD 30%, ostial diagonal 30-40%, proximal RCA 95%, inferior AK, EF 45-50% => PCI: Promus DES x 2 to RCA), SVT, HTN, DM, hypercholesterolemia. Never smoker. BMI 29. S/p ORIF L hip intertrochanteric femur fracture 11/15/14. S/p kyphoplasty 12/27/12 and 07/15/15.  PCP is listed as Dr. Shirline Frees.  Medications include: Lopressor, midodrine, Nitro, Zofran, simvastatin.   07/19/15 EKG: NSR, negative T wave in V1.  07/20/15 Echo: Study Conclusions - Left ventricle: The cavity size was normal. Wall thickness was increased in a pattern of mild LVH. Systolic function was normal. The estimated ejection fraction was in the range of 55% to 60%. Wall motion was normal; there were no regional wall motion abnormalities. Doppler parameters are consistent with abnormal left ventricular relaxation (grade 1 diastolic dysfunction). - Mitral valve: Calcified annulus. - Right atrium: The atrium was mildly dilated.  PTCA and stenting of RCA 01/15/2013: Successful PCI of the proximal to mid RCA with 2 drug-eluting stents. Procedure was complicated by difficult guide backup, extreme vessel tortuosity, and extensive dissection post PTCA. With stenting we were able to achieve an excellent angiographic result  (post-stent stenosis 0%)  Cardiac cath 01/08/2013: 1. Single vessel CAD involving the proximal RCA (95%) 2. Mild LV dysfunction secondary to her recent Inferior wall NSTEMI.  07/18/15 1V CXR: IMPRESSION: Minimal left pleural effusion versus pleural thickening unchanged compared to prior chest x-ray of July 2016. Otherwise no acute abnormality identified.  07/18/15 Head CT: IMPRESSION: No focal acute intracranial abnormality identified.  She will need labs on arrival.   She is a same day work-up, so further evaluation by her anesthesiologist on the day of surgery to ensure no acute changes and that labs are acceptable.  George Hugh [facility] Short Stay Center/Anesthesiology Phone [phone] 09/16/2015 11:29 AM

## 2015-09-16 NOTE — Progress Notes (Signed)
Pt denies SOB and chest pain but is under the care of Dr. Percival Spanish, cardiology. Pt made aware to stop taking Aspirin, otc vitamins and herbal medications. Do not take any NSAIDs ie: Ibuprofen, Advil, Naproxen or any medication containing Aspirin. Pt verbalized understanding of all pre-op instructions. Pt chart forwarded to [place], [place], anesthesia,  for review.

## 2015-09-17 ENCOUNTER — Ambulatory Visit (HOSPITAL_COMMUNITY): Payer: Medicare Other | Admitting: Vascular Surgery

## 2015-09-17 ENCOUNTER — Encounter (HOSPITAL_COMMUNITY)
Admission: RE | Disposition: A | Discharge status: Home / Self Care | Payer: Self-pay | Source: Ambulatory Visit | Attending: Orthopedic Surgery

## 2015-09-17 ENCOUNTER — Observation Stay (HOSPITAL_COMMUNITY)
Admission: RE | Admit: 2015-09-17 | Discharge: 2015-09-18 | Disposition: A | Discharge status: Home / Self Care | Payer: Medicare Other | Source: Ambulatory Visit | Attending: Orthopedic Surgery | Admitting: Orthopedic Surgery

## 2015-09-17 ENCOUNTER — Other Ambulatory Visit: Payer: Medicare Other

## 2015-09-17 ENCOUNTER — Encounter (HOSPITAL_COMMUNITY): Payer: Self-pay | Admitting: *Deleted

## 2015-09-17 ENCOUNTER — Ambulatory Visit (HOSPITAL_COMMUNITY): Payer: Medicare Other

## 2015-09-17 DIAGNOSIS — Z955 Presence of coronary angioplasty implant and graft: Secondary | ICD-10-CM | POA: Diagnosis not present

## 2015-09-17 DIAGNOSIS — E785 Hyperlipidemia, unspecified: Secondary | ICD-10-CM | POA: Diagnosis not present

## 2015-09-17 DIAGNOSIS — F419 Anxiety disorder, unspecified: Secondary | ICD-10-CM | POA: Insufficient documentation

## 2015-09-17 DIAGNOSIS — Z01818 Encounter for other preprocedural examination: Secondary | ICD-10-CM

## 2015-09-17 DIAGNOSIS — E119 Type 2 diabetes mellitus without complications: Secondary | ICD-10-CM | POA: Diagnosis not present

## 2015-09-17 DIAGNOSIS — I2510 Atherosclerotic heart disease of native coronary artery without angina pectoris: Secondary | ICD-10-CM

## 2015-09-17 DIAGNOSIS — M4854XA Collapsed vertebra, not elsewhere classified, thoracic region, initial encounter for fracture: Secondary | ICD-10-CM | POA: Diagnosis not present

## 2015-09-17 DIAGNOSIS — I10 Essential (primary) hypertension: Secondary | ICD-10-CM | POA: Diagnosis not present

## 2015-09-17 DIAGNOSIS — I252 Old myocardial infarction: Secondary | ICD-10-CM | POA: Diagnosis not present

## 2015-09-17 DIAGNOSIS — I471 Supraventricular tachycardia: Secondary | ICD-10-CM

## 2015-09-17 DIAGNOSIS — Z79899 Other long term (current) drug therapy: Secondary | ICD-10-CM | POA: Diagnosis not present

## 2015-09-17 DIAGNOSIS — Z419 Encounter for procedure for purposes other than remedying health state, unspecified: Secondary | ICD-10-CM

## 2015-09-17 DIAGNOSIS — IMO0002 Reserved for concepts with insufficient information to code with codable children: Secondary | ICD-10-CM

## 2015-09-17 HISTORY — PX: KYPHOPLASTY: SHX5884

## 2015-09-17 HISTORY — DX: Reserved for concepts with insufficient information to code with codable children: IMO0002

## 2015-09-17 HISTORY — DX: Edema, unspecified: R60.9

## 2015-09-17 LAB — POCT I-STAT 4, (NA,K, GLUC, HGB,HCT)
Glucose, Bld: 125 mg/dL — ABNORMAL HIGH (ref 65–99)
HEMATOCRIT: 33 % — AB (ref 36.0–46.0)
HEMOGLOBIN: 11.2 g/dL — AB (ref 12.0–15.0)
POTASSIUM: 3.6 mmol/L (ref 3.5–5.1)
SODIUM: 139 mmol/L (ref 135–145)

## 2015-09-17 LAB — COMPREHENSIVE METABOLIC PANEL
ALBUMIN: 3.4 g/dL — AB (ref 3.5–5.0)
ALT: 13 U/L — ABNORMAL LOW (ref 14–54)
AST: 19 U/L (ref 15–41)
Alkaline Phosphatase: 71 U/L (ref 38–126)
Anion gap: 9 (ref 5–15)
BILIRUBIN TOTAL: 0.6 mg/dL (ref 0.3–1.2)
BUN: 17 mg/dL (ref 6–20)
CO2: 25 mmol/L (ref 22–32)
Calcium: 9.5 mg/dL (ref 8.9–10.3)
Chloride: 104 mmol/L (ref 101–111)
Creatinine, Ser: 0.67 mg/dL (ref 0.44–1.00)
GFR calc Af Amer: >60 mL/min (ref >60)
GFR calc non Af Amer: >60 mL/min (ref >60)
GLUCOSE: 112 mg/dL — AB (ref 65–99)
POTASSIUM: 4.2 mmol/L (ref 3.5–5.1)
Sodium: 138 mmol/L (ref 135–145)
TOTAL PROTEIN: 6.6 g/dL (ref 6.5–8.1)

## 2015-09-17 LAB — SURGICAL PCR SCREEN
MRSA, PCR: NEGATIVE
STAPHYLOCOCCUS AUREUS: NEGATIVE

## 2015-09-17 LAB — GLUCOSE, CAPILLARY
GLUCOSE-CAPILLARY: 106 mg/dL — AB (ref 65–99)
GLUCOSE-CAPILLARY: 90 mg/dL (ref 65–99)
GLUCOSE-CAPILLARY: 75 mg/dL (ref 65–99)
GLUCOSE-CAPILLARY: 233 mg/dL — AB (ref 65–99)
Glucose-Capillary: 110 mg/dL — ABNORMAL HIGH (ref 65–99)

## 2015-09-17 LAB — CBC WITH DIFFERENTIAL/PLATELET
BASOS ABS: 0 10*3/uL (ref 0.0–0.1)
BASOS PCT: 0 %
Eosinophils Absolute: 0.1 10*3/uL (ref 0.0–0.7)
Eosinophils Relative: 1 %
HEMATOCRIT: 39 % (ref 36.0–46.0)
HEMOGLOBIN: 12.5 g/dL (ref 12.0–15.0)
Lymphocytes Relative: 26 %
Lymphs Abs: 1.8 10*3/uL (ref 0.7–4.0)
MCH: 30.6 pg (ref 26.0–34.0)
MCHC: 32.1 g/dL (ref 30.0–36.0)
MCV: 95.6 fL (ref 78.0–100.0)
MONO ABS: 0.5 10*3/uL (ref 0.1–1.0)
Monocytes Relative: 7 %
NEUTROS ABS: 4.6 10*3/uL (ref 1.7–7.7)
NEUTROS PCT: 66 %
Platelets: 202 10*3/uL (ref 150–400)
RBC: 4.08 MIL/uL (ref 3.87–5.11)
RDW: 14.8 % (ref 11.5–15.5)
WBC: 7 10*3/uL (ref 4.0–10.5)

## 2015-09-17 LAB — PROTIME-INR
INR: 1.07 (ref 0.00–1.49)
Prothrombin Time: 14.1 seconds (ref 11.6–15.2)

## 2015-09-17 LAB — APTT: APTT: 37 s (ref 24–37)

## 2015-09-17 SURGERY — KYPHOPLASTY
Anesthesia: General | Site: Back

## 2015-09-17 MED ORDER — LIDOCAINE HCL (CARDIAC) 20 MG/ML IV SOLN
INTRAVENOUS | Status: DC | PRN
  Administered 2015-09-17: 60 mg via INTRAVENOUS

## 2015-09-17 MED ORDER — DEXTROSE 50 % IV SOLN
INTRAVENOUS | Status: AC
  Administered 2015-09-17: 25 mL via INTRAVENOUS
  Filled 2015-09-17: qty 50

## 2015-09-17 MED ORDER — NITROGLYCERIN 0.4 MG SL SUBL: 0.4000 mg | SUBLINGUAL_TABLET | SUBLINGUAL | Status: DC | PRN

## 2015-09-17 MED ORDER — ONDANSETRON HCL 4 MG/2ML IJ SOLN
INTRAMUSCULAR | Status: DC | PRN
  Administered 2015-09-17: 4 mg via INTRAVENOUS

## 2015-09-17 MED ORDER — ALUM & MAG HYDROXIDE-SIMETH 200-200-20 MG/5ML PO SUSP: 30.0000 mL | Freq: Four times a day (QID) | ORAL | Status: DC | PRN

## 2015-09-17 MED ORDER — ONDANSETRON HCL 4 MG/2ML IJ SOLN: 4.0000 mg | INTRAMUSCULAR | Status: DC | PRN

## 2015-09-17 MED ORDER — ACETAMINOPHEN 325 MG PO TABS: 650.0000 mg | ORAL_TABLET | ORAL | Status: DC | PRN

## 2015-09-17 MED ORDER — MORPHINE SULFATE (PF) 2 MG/ML IV SOLN: 1.0000 mg | INTRAVENOUS | Status: DC | PRN

## 2015-09-17 MED ORDER — LACTATED RINGERS IV SOLN
INTRAVENOUS | Status: DC
  Administered 2015-09-17 – 2015-09-18 (×3): via INTRAVENOUS

## 2015-09-17 MED ORDER — DOCUSATE SODIUM 100 MG PO CAPS: 100.0000 mg | ORAL_CAPSULE | Freq: Every day | ORAL | Status: DC | PRN

## 2015-09-17 MED ORDER — CEFAZOLIN SODIUM-DEXTROSE 2-3 GM-% IV SOLR
2.0000 g | INTRAVENOUS | Status: AC
  Administered 2015-09-17: 2 g via INTRAVENOUS
  Filled 2015-09-17: qty 50

## 2015-09-17 MED ORDER — ESMOLOL HCL 100 MG/10ML IV SOLN
INTRAVENOUS | Status: DC | PRN
  Administered 2015-09-17: 20 mg via INTRAVENOUS
  Administered 2015-09-17: 30 mg via INTRAVENOUS
  Administered 2015-09-17: 20 mg via INTRAVENOUS
  Administered 2015-09-17: 30 mg via INTRAVENOUS

## 2015-09-17 MED ORDER — NEOSTIGMINE METHYLSULFATE 10 MG/10ML IV SOLN
INTRAVENOUS | Status: DC | PRN
  Administered 2015-09-17: 3 mg via INTRAVENOUS

## 2015-09-17 MED ORDER — ZOLPIDEM TARTRATE 5 MG PO TABS: 5.0000 mg | ORAL_TABLET | Freq: Every evening | ORAL | Status: DC | PRN

## 2015-09-17 MED ORDER — BACITRACIN ZINC 500 UNIT/GM EX OINT
TOPICAL_OINTMENT | CUTANEOUS | Status: AC
  Filled 2015-09-17: qty 28.35

## 2015-09-17 MED ORDER — FENTANYL CITRATE (PF) 100 MCG/2ML IJ SOLN
INTRAMUSCULAR | Status: DC | PRN
  Administered 2015-09-17: 100 ug via INTRAVENOUS
  Administered 2015-09-17: 50 ug via INTRAVENOUS

## 2015-09-17 MED ORDER — POTASSIUM CHLORIDE CRYS ER 20 MEQ PO TBCR
20.0000 meq | EXTENDED_RELEASE_TABLET | Freq: Once | ORAL | Status: AC
  Administered 2015-09-17: 20 meq via ORAL
  Filled 2015-09-17: qty 1

## 2015-09-17 MED ORDER — OXYCODONE-ACETAMINOPHEN 5-325 MG PO TABS: 1.0000 | ORAL_TABLET | ORAL | Status: DC | PRN

## 2015-09-17 MED ORDER — PROMETHAZINE HCL 25 MG/ML IJ SOLN
6.2500 mg | Freq: Once | INTRAMUSCULAR | Status: AC
  Administered 2015-09-17: 6.25 mg via INTRAVENOUS

## 2015-09-17 MED ORDER — ADENOSINE 6 MG/2ML IV SOLN
INTRAVENOUS | Status: DC | PRN
  Administered 2015-09-17 (×2): 6 mg via INTRAVENOUS

## 2015-09-17 MED ORDER — HYDROCODONE-ACETAMINOPHEN 5-325 MG PO TABS
1.0000 | ORAL_TABLET | ORAL | Status: DC | PRN
  Administered 2015-09-17: 1 via ORAL
  Filled 2015-09-17: qty 1

## 2015-09-17 MED ORDER — SODIUM CHLORIDE 0.9 % IJ SOLN: 3.0000 mL | INTRAMUSCULAR | Status: DC | PRN

## 2015-09-17 MED ORDER — ROCURONIUM BROMIDE 100 MG/10ML IV SOLN
INTRAVENOUS | Status: DC | PRN
  Administered 2015-09-17: 30 mg via INTRAVENOUS

## 2015-09-17 MED ORDER — ESMOLOL HCL 100 MG/10ML IV SOLN
INTRAVENOUS | Status: AC
Start: 2015-09-17 — End: 2015-09-17
  Filled 2015-09-17: qty 10

## 2015-09-17 MED ORDER — BUPIVACAINE-EPINEPHRINE (PF) 0.25% -1:200000 IJ SOLN
INTRAMUSCULAR | Status: AC
  Filled 2015-09-17: qty 30

## 2015-09-17 MED ORDER — METOPROLOL TARTRATE 1 MG/ML IV SOLN
INTRAVENOUS | Status: DC | PRN
  Administered 2015-09-17: 5 mg via INTRAVENOUS

## 2015-09-17 MED ORDER — ACETAMINOPHEN 650 MG RE SUPP: 650.0000 mg | RECTAL | Status: DC | PRN

## 2015-09-17 MED ORDER — SENNOSIDES-DOCUSATE SODIUM 8.6-50 MG PO TABS: 1.0000 | ORAL_TABLET | Freq: Every evening | ORAL | Status: DC | PRN

## 2015-09-17 MED ORDER — METFORMIN HCL 500 MG PO TABS
1000.0000 mg | ORAL_TABLET | Freq: Two times a day (BID) | ORAL | Status: DC
  Administered 2015-09-18: 1000 mg via ORAL
  Filled 2015-09-17: qty 2

## 2015-09-17 MED ORDER — METOPROLOL TARTRATE 50 MG PO TABS
50.0000 mg | ORAL_TABLET | Freq: Two times a day (BID) | ORAL | Status: DC
  Administered 2015-09-18: 50 mg via ORAL
  Filled 2015-09-17 (×2): qty 1

## 2015-09-17 MED ORDER — SODIUM CHLORIDE 0.9 % IV SOLN: 250.0000 mL | INTRAVENOUS | Status: DC

## 2015-09-17 MED ORDER — PROPOFOL 10 MG/ML IV BOLUS
INTRAVENOUS | Status: DC | PRN
  Administered 2015-09-17: 30 mg via INTRAVENOUS
  Administered 2015-09-17: 130 mg via INTRAVENOUS

## 2015-09-17 MED ORDER — PHENYLEPHRINE 40 MCG/ML (10ML) SYRINGE FOR IV PUSH (FOR BLOOD PRESSURE SUPPORT)
PREFILLED_SYRINGE | INTRAVENOUS | Status: AC
  Filled 2015-09-17: qty 20

## 2015-09-17 MED ORDER — DIAZEPAM 5 MG PO TABS
5.0000 mg | ORAL_TABLET | Freq: Four times a day (QID) | ORAL | Status: DC | PRN
  Administered 2015-09-18: 5 mg via ORAL
  Filled 2015-09-17 (×2): qty 1

## 2015-09-17 MED ORDER — GLYCOPYRROLATE 0.2 MG/ML IJ SOLN
INTRAMUSCULAR | Status: DC | PRN
  Administered 2015-09-17: 0.4 mg via INTRAVENOUS

## 2015-09-17 MED ORDER — FENTANYL CITRATE (PF) 100 MCG/2ML IJ SOLN: 25.0000 ug | INTRAMUSCULAR | Status: DC | PRN

## 2015-09-17 MED ORDER — PROMETHAZINE HCL 25 MG/ML IJ SOLN
INTRAMUSCULAR | Status: AC
  Filled 2015-09-17: qty 1

## 2015-09-17 MED ORDER — FENTANYL CITRATE (PF) 250 MCG/5ML IJ SOLN
INTRAMUSCULAR | Status: AC
  Filled 2015-09-17: qty 5

## 2015-09-17 MED ORDER — SODIUM CHLORIDE 0.9 % IJ SOLN: 3.0000 mL | Freq: Two times a day (BID) | INTRAMUSCULAR | Status: DC

## 2015-09-17 MED ORDER — CEFAZOLIN SODIUM 1-5 GM-% IV SOLN
1.0000 g | Freq: Three times a day (TID) | INTRAVENOUS | Status: AC
  Administered 2015-09-17 – 2015-09-18 (×2): 1 g via INTRAVENOUS
  Filled 2015-09-17 (×2): qty 50

## 2015-09-17 MED ORDER — DOCUSATE SODIUM 100 MG PO CAPS
100.0000 mg | ORAL_CAPSULE | Freq: Two times a day (BID) | ORAL | Status: DC
  Administered 2015-09-17 – 2015-09-18 (×2): 100 mg via ORAL
  Filled 2015-09-17 (×2): qty 1

## 2015-09-17 MED ORDER — RAMIPRIL 10 MG PO CAPS
10.0000 mg | ORAL_CAPSULE | Freq: Every day | ORAL | Status: DC
  Administered 2015-09-18: 10 mg via ORAL
  Filled 2015-09-17 (×2): qty 1

## 2015-09-17 MED ORDER — VITAMIN D 1000 UNITS PO TABS
2000.0000 [IU] | ORAL_TABLET | Freq: Every morning | ORAL | Status: DC
  Administered 2015-09-18: 2000 [IU] via ORAL
  Filled 2015-09-17 (×2): qty 2

## 2015-09-17 MED ORDER — MENTHOL 3 MG MT LOZG: 1.0000 | LOZENGE | OROMUCOSAL | Status: DC | PRN

## 2015-09-17 MED ORDER — PHENOL 1.4 % MT LIQD
1.0000 | OROMUCOSAL | Status: DC | PRN
Start: 2015-09-17 — End: 2015-09-18

## 2015-09-17 MED ORDER — SIMVASTATIN 40 MG PO TABS
40.0000 mg | ORAL_TABLET | Freq: Every day | ORAL | Status: DC
  Administered 2015-09-17: 40 mg via ORAL
  Filled 2015-09-17: qty 1

## 2015-09-17 MED ORDER — ADULT MULTIVITAMIN W/MINERALS CH
1.0000 | ORAL_TABLET | Freq: Every morning | ORAL | Status: DC
  Administered 2015-09-18: 1 via ORAL
  Filled 2015-09-17: qty 1

## 2015-09-17 MED ORDER — ONDANSETRON 4 MG PO TBDP
4.0000 mg | ORAL_TABLET | Freq: Three times a day (TID) | ORAL | Status: DC | PRN
Start: 2015-09-17 — End: 2015-09-17
  Filled 2015-09-17: qty 1

## 2015-09-17 MED ORDER — BUPIVACAINE-EPINEPHRINE 0.25% -1:200000 IJ SOLN
INTRAMUSCULAR | Status: DC | PRN
  Administered 2015-09-17: 10 mL

## 2015-09-17 MED ORDER — GLYCOPYRROLATE 0.2 MG/ML IJ SOLN
INTRAMUSCULAR | Status: AC
  Filled 2015-09-17: qty 3

## 2015-09-17 MED ORDER — ONDANSETRON 4 MG PO TBDP
4.0000 mg | ORAL_TABLET | Freq: Three times a day (TID) | ORAL | Status: DC | PRN
  Filled 2015-09-17: qty 1

## 2015-09-17 MED ORDER — MUPIROCIN 2 % EX OINT
1.0000 "application " | TOPICAL_OINTMENT | Freq: Once | CUTANEOUS | Status: AC
  Administered 2015-09-17: 1 via TOPICAL
  Filled 2015-09-17: qty 22

## 2015-09-17 MED ORDER — PROPOFOL 10 MG/ML IV BOLUS
INTRAVENOUS | Status: AC
  Filled 2015-09-17: qty 20

## 2015-09-17 MED ORDER — PHENYLEPHRINE HCL 10 MG/ML IJ SOLN
INTRAMUSCULAR | Status: DC | PRN
  Administered 2015-09-17 (×2): 80 ug via INTRAVENOUS
  Administered 2015-09-17: 120 ug via INTRAVENOUS
  Administered 2015-09-17: 80 ug via INTRAVENOUS

## 2015-09-17 MED ORDER — PIOGLITAZONE HCL 30 MG PO TABS
30.0000 mg | ORAL_TABLET | Freq: Every day | ORAL | Status: DC
  Filled 2015-09-17 (×2): qty 1

## 2015-09-17 MED ORDER — INSULIN GLARGINE 100 UNIT/ML ~~LOC~~ SOLN
10.0000 [IU] | Freq: Every day | SUBCUTANEOUS | Status: DC
  Administered 2015-09-18: 10 [IU] via SUBCUTANEOUS
  Filled 2015-09-17: qty 0.1

## 2015-09-17 MED ORDER — IOHEXOL 300 MG/ML  SOLN
INTRAMUSCULAR | Status: DC | PRN
  Administered 2015-09-17: 50 mL

## 2015-09-17 MED ORDER — POVIDONE-IODINE 7.5 % EX SOLN: Freq: Once | CUTANEOUS | Status: DC

## 2015-09-17 MED ORDER — METOPROLOL TARTRATE 1 MG/ML IV SOLN
INTRAVENOUS | Status: AC
  Filled 2015-09-17: qty 5

## 2015-09-17 SURGICAL SUPPLY — 43 items
BANDAGE ADH SHEER 1  50/CT (GAUZE/BANDAGES/DRESSINGS) ×6 IMPLANT
BLADE SURG 15 STRL LF DISP TIS (BLADE) ×1 IMPLANT
BLADE SURG 15 STRL SS (BLADE) ×2
BNDG COHESIVE 2X5 WHT NS (GAUZE/BANDAGES/DRESSINGS) ×3 IMPLANT
COVER MAYO STAND STRL (DRAPES) ×3 IMPLANT
COVER SURGICAL LIGHT HANDLE (MISCELLANEOUS) ×3 IMPLANT
CURETTE WEDGE 8.5MM KYPHX (MISCELLANEOUS) ×3 IMPLANT
DERMABOND ADVANCED (GAUZE/BANDAGES/DRESSINGS)
DERMABOND ADVANCED .7 DNX12 (GAUZE/BANDAGES/DRESSINGS) IMPLANT
DRAPE C-ARM 42X72 X-RAY (DRAPES) ×3 IMPLANT
DRAPE INCISE IOBAN 66X45 STRL (DRAPES) ×3 IMPLANT
DRAPE LAPAROTOMY T 102X78X121 (DRAPES) ×3 IMPLANT
DRAPE SURG 17X23 STRL (DRAPES) ×12 IMPLANT
DURAPREP 26ML APPLICATOR (WOUND CARE) ×3 IMPLANT
GAUZE SPONGE 4X4 12PLY STRL (GAUZE/BANDAGES/DRESSINGS) ×3 IMPLANT
GAUZE SPONGE 4X4 16PLY XRAY LF (GAUZE/BANDAGES/DRESSINGS) ×3 IMPLANT
GLOVE BIO SURGEON STRL SZ7 (GLOVE) ×3 IMPLANT
GLOVE BIO SURGEON STRL SZ8 (GLOVE) ×3 IMPLANT
GLOVE BIOGEL PI IND STRL 7.0 (GLOVE) ×1 IMPLANT
GLOVE BIOGEL PI IND STRL 8 (GLOVE) ×1 IMPLANT
GLOVE BIOGEL PI INDICATOR 7.0 (GLOVE) ×2
GLOVE BIOGEL PI INDICATOR 8 (GLOVE) ×2
GOWN STRL REUS W/ TWL LRG LVL3 (GOWN DISPOSABLE) ×2 IMPLANT
GOWN STRL REUS W/ TWL XL LVL3 (GOWN DISPOSABLE) ×1 IMPLANT
GOWN STRL REUS W/TWL LRG LVL3 (GOWN DISPOSABLE) ×4
GOWN STRL REUS W/TWL XL LVL3 (GOWN DISPOSABLE) ×2
KIT BASIN OR (CUSTOM PROCEDURE TRAY) ×3 IMPLANT
KIT ROOM TURNOVER OR (KITS) ×3 IMPLANT
NEEDLE 22X1 1/2 (OR ONLY) (NEEDLE) ×3 IMPLANT
NEEDLE HYPO 25X1 1.5 SAFETY (NEEDLE) ×3 IMPLANT
NEEDLE SPNL 18GX3.5 QUINCKE PK (NEEDLE) ×6 IMPLANT
NS IRRIG 1000ML POUR BTL (IV SOLUTION) ×3 IMPLANT
PACK SURGICAL SETUP 50X90 (CUSTOM PROCEDURE TRAY) ×3 IMPLANT
PAD ARMBOARD 7.5X6 YLW CONV (MISCELLANEOUS) ×6 IMPLANT
PAD ONESTEP ZOLL R SERIES ADT (MISCELLANEOUS) ×3 IMPLANT
POSITIONER HEAD PRONE TRACH (MISCELLANEOUS) ×3 IMPLANT
SUT MNCRL AB 4-0 PS2 18 (SUTURE) ×3 IMPLANT
SYR BULB IRRIGATION 50ML (SYRINGE) ×3 IMPLANT
SYR CONTROL 10ML LL (SYRINGE) ×3 IMPLANT
TAPE CLOTH SURG 4X10 WHT LF (GAUZE/BANDAGES/DRESSINGS) ×3 IMPLANT
TOWEL OR 17X24 6PK STRL BLUE (TOWEL DISPOSABLE) ×3 IMPLANT
TOWEL OR 17X26 10 PK STRL BLUE (TOWEL DISPOSABLE) ×3 IMPLANT
TRAY KYPHOPAK 15/3 ONESTEP 1ST (MISCELLANEOUS) ×3 IMPLANT

## 2015-09-17 NOTE — Anesthesia Preprocedure Evaluation (Addendum)
Anesthesia Evaluation  Patient identified by MRN, date of birth, ID band Patient awake    Reviewed: Allergy & Precautions, NPO status , Patient's Chart, lab work & pertinent test results, reviewed documented beta blocker date and time   History of Anesthesia Complications (+) history of anesthetic complications  Airway Mallampati: II  TM Distance: >3 FB Neck ROM: Full    Dental  (+) Partial Lower, Partial Upper   Pulmonary neg pulmonary ROS,    breath sounds clear to auscultation- rhonchi       Cardiovascular hypertension, Pt. on medications and Pt. on home beta blockers + CAD, + Past MI and +CHF   Rhythm:Regular     Neuro/Psych PSYCHIATRIC DISORDERS Anxiety T12 compression fracture    GI/Hepatic Neg liver ROS, PUD,   Endo/Other  diabetes, Type 2  Renal/GU negative Renal ROS     Musculoskeletal  (+) Arthritis ,   Abdominal   Peds  Hematology negative hematology ROS (+)   Anesthesia Other Findings   Reproductive/Obstetrics                            Anesthesia Physical Anesthesia Plan  ASA: III  Anesthesia Plan: General   Post-op Pain Management:    Induction: Intravenous  Airway Management Planned: Oral ETT  Additional Equipment: None  Intra-op Plan:   Post-operative Plan: Extubation in OR  Informed Consent: I have reviewed the patients History and Physical, chart, labs and discussed the procedure including the risks, benefits and alternatives for the proposed anesthesia with the patient or authorized representative who has indicated his/her understanding and acceptance.   Dental advisory given  Plan Discussed with: CRNA and Surgeon  Anesthesia Plan Comments:         Anesthesia Quick Evaluation

## 2015-09-17 NOTE — Op Note (Signed)
NAMEAMARILYS, LYLES NO.:  [id number]  MEDICAL RECORD NO.:  [id number]  LOCATION:  5N02C                        FACILITY:  [facility]  PHYSICIAN:  Phylliss Bob, MD      DATE OF BIRTH:  1946-03-07  DATE OF PROCEDURE:  09/17/2015                              OPERATIVE REPORT   PREOPERATIVE DIAGNOSIS:  T12 compression fracture.  POSTOPERATIVE DIAGNOSIS:  T12 compression fracture.  PROCEDURE:  T12 kyphoplasty, using a bipedicular approach.  SURGEON:  Phylliss Bob, MD  ASSISTANT:  Pricilla Holm, PA-C  ANESTHESIA:  General endotracheal anesthesia.  COMPLICATIONS:  None.  DISPOSITION:  Stable.  ESTIMATED BLOOD LOSS:  Minimal.  INDICATIONS FOR SURGERY:  Briefly, Ms. Eugenie Birks is a pleasant 69 year old female, who did present to me with an increase in pain in her low back. She is status post a kyphoplasty procedure, and radiographs in the office did reveal an additional compression fracture at the T12 level, immediately below the patient's previous T11 kyphoplasty.  The patient's pain was consistent with a compression fracture, and thus we did discuss proceeding with the procedure reflected above.  The patient did elect to proceed.  OPERATIVE DETAILS:  On September 17, 2015, the patient was brought to surgery and general endotracheal anesthesia was administered.  The patient was placed prone on a well-padded flat Jackson bed.  Gel rolls were placed under the patient's chest and hips.  Antibiotics were given and a time-out procedure was performed.  I then made 2 stab incisions, immediately lateral and superior to the T12 pedicles.  Using biplanar fluoroscopy, I was able to advance the Jamshidi needles across the T12 pedicles into the vertebral bodies.  I then inserted kyphoplasty balloons and I did infiltrate the balloons with approximately 4 mL of contrast agent.  I did note partial restoration of the height loss of the superior endplate.  Of note, prior to placing  the balloons, I did drill and curette through the cannulas.  I then introduced a total of 6 mL of cement, 3 in each side through the pedicles.  Upon review of the AP and lateral fluoroscopic images, there was no extravasation of cement noted anteriorly and from the vertebral body or posteriorly into the spinal canal.  The cement was then allowed to harden.  I did note excellent interdigitation of the cement.  The cannulas were then removed and the wounds were irrigated.  The wound was then closed with 3-0 Monocryl.  Benzoin and Steri-Strips were then applied followed by sterile dressing.  All instrument counts were correct at the termination of the procedure.  Of note, Pricilla Holm was my assistant for surgery.     Phylliss Bob, MD     MD/MEDQ  D:  09/17/2015  T:  09/17/2015  Job:  [id number]  cc:   Azalia Bilis, M.D.

## 2015-09-17 NOTE — Progress Notes (Signed)
Isaac Laud PA for [place] at bedside, talked to patient.

## 2015-09-17 NOTE — Anesthesia Procedure Notes (Signed)
Procedure Name: Intubation Date/Time: 09/17/2015 1:49 PM Performed by: Susa Loffler Pre-anesthesia Checklist: Patient identified, Emergency Drugs available, Suction available, Patient being monitored and Timeout performed Patient Re-evaluated:Patient Re-evaluated prior to inductionOxygen Delivery Method: Circle system utilized Preoxygenation: Pre-oxygenation with 100% oxygen Intubation Type: IV induction Ventilation: Mask ventilation without difficulty and Oral airway inserted - appropriate to patient size Laryngoscope Size: Mac and 3 Grade View: Grade II Tube type: Oral Tube size: 7.0 mm Number of attempts: 1 Airway Equipment and Method: Stylet and Oral airway Placement Confirmation: ETT inserted through vocal cords under direct vision,  positive ETCO2 and breath sounds checked- equal and bilateral Secured at: 21 cm Tube secured with: Tape Dental Injury: Teeth and Oropharynx as per pre-operative assessment

## 2015-09-17 NOTE — Consult Note (Signed)
CARDIOLOGY CONSULT NOTE   Patient ID: [name] MRN: [id number], DOB/AGE: [birth year]   Admit date: 09/17/2015 Date of Consult: 09/17/2015   Primary Physician: Shirline Frees, MD Primary Cardiologist: Dr. Percival Spanish  Pt. Profile  The patient is a very pleasant 69 year old Caucasian female with past medical history of HTN, HLD, DM II, SVT and CAD s/p PCI to RCA in 12/2012 presented for kyphoplasty and supposedly had SVT for 10 min during extubation process, terminated after 1 dose of adenosine and 1 dose of Lopressor IV 5mg . No strip was saved during SVT.  Problem List  Past Medical History  Diagnosis Date  . Hypertension   . Hypercholesteremia   . CAD (coronary artery disease)     a. NSTEMI in setting of UTI and SVT 12/2012 => LHC 01/08/13: Proximal LAD 30%, ostial diagonal 30-40%, proximal RCA 95%, inferior AK, EF 45-50% => PCI: Promus DES x 2 to RCA;  b. Echo 01/05/13: EF 09-47%, grade 1 diastolic dysfunction, MAC   . SVT (supraventricular tachycardia) ([place])   . Rectal ulceration     colonoscopy 12/2012  . Anxiety     Due to current back pain and has to lie flat.  . Arthritis   . History of blood transfusion   . Myocardial infarction ([place]) 2014  . Type II diabetes mellitus ([place])   . Compression fracture     thoracic 12  . Edema     Past Surgical History  Procedure Laterality Date  . Eye surgery Bilateral "many"  . Tubal ligation    . Kyphoplasty  12/27/2012    Procedure: KYPHOPLASTY;  Surgeon: Sinclair Ship, MD;  Location: [facility];  Service: Orthopedics;  Laterality: Bilateral;  T-8 kyphoplasty  . Colonoscopy Left 01/12/2013    Procedure: COLONOSCOPY;  Surgeon: Wonda Horner, MD;  Location: [facility] ENDOSCOPY;  Service: Endoscopy;  Laterality: Left;  . Colonoscopy N/A 01/14/2013    Procedure: COLONOSCOPY;  Surgeon: Wonda Horner, MD;  Location: [facility] ENDOSCOPY;  Service: Endoscopy;  Laterality: N/A;  Rm 2034, attemped Colon on 2-15=inadequate prep  . Coronary angioplasty   12/2012  . Left heart catheterization with coronary angiogram N/A 01/08/2013    Procedure: LEFT HEART CATHETERIZATION WITH CORONARY ANGIOGRAM;  Surgeon: Thayer Headings, MD;  Location: [facility] CATH LAB;  Service: Cardiovascular;  Laterality: N/A;  . Percutaneous stent intervention N/A 01/15/2013    Procedure: PERCUTANEOUS STENT INTERVENTION;  Surgeon: Peter M Martinique, MD;  Location: [facility] C[facility] LAB;  Service: Cardiovascular;  Laterality: N/A;  . Orif hip fracture Left 11/15/2014    Procedure: OPEN REDUCTION INTERNAL FIXATION HIP INTERTROCH;  Surgeon: Mcarthur Rossetti, MD;  Location: [facility];  Service: Orthopedics;  Laterality: Left;  Marland Kitchen Kyphoplasty N/A 07/15/2015    Procedure: T11 KYPHOPLASTY;  Surgeon: Phylliss Bob, MD;  Location: [facility];  Service: Orthopedics;  Laterality: N/A;  T11 kyphoplasty  . Cataract extraction, bilateral Bilateral      Allergies  Allergies  Allergen Reactions  . Oxycodone Nausea Only    HPI   The patient is a very pleasant 69 year old Caucasian female with past medical history of HTN, HLD, DM II, SVT and CAD s/p PCI to RCA in 12/2012. She had a remote history of NSTEMI in the setting of UTI and SVT in February 2014. She underwent cardiac catheterization at that time which showed 30% proximal LAD, 30% ostial D1, 95% proximal RCA, EF 45-50%. She underwent DES 2 to RCA. Echocardiogram obtained during the same admission showed EF 55-65%, grade  1 diastolic dysfunction, MAC. She has been doing well from cardiac perspective. She denies any recent exertional chest discomfort or shortness of breath. The last time she was seen by Dr. Percival Spanish was on 07/09/2015, at which time she was evaluated for preoperative risk prior to her kyphoplasty procedure. Given how well she is been doing, she was cleared from our neurology perspective after repeat echocardiogram. She was instructed to hold her aspirin 5 days prior to the procedure. It was felt her episodic tachycardia is related to the pain she is  having from her back. Repeat echocardiogram obtained on 07/20/2015 showed EF 16-60%, grade 1 diastolic dysfunction.  She underwent the planned kyphoplasty procedure on 09/17/2015, during the extubation process, her heart rate went from 50s up to 160s. I spoke with CRNA with anesthesia, her heart rate has been persistently in the 50s throughout the entire procedure. There was no obvious complication from kyphoplasty repair. She went into SVT/atrial tachycardia with heart rate 160 around 3 PM. The tachycardia persisted around 10 minutes before she was given adenosine. After adenosine, she did not have prolonged pause, however her heart rate went down to the 70s. She was given an additional 5 mg Lopressor IV to maintain normal sinus rhythm. She was later transferred to PACU, and has been maintaining normal sinus rhythm since. Patient denies any palpitation, chest discomfort, dizziness or shortness breath after procedure. Cardiology has been consulted for SVT.  Of note, unfortunately no strip was saved during SVT, she has been maintaining sinus rhythm in PACU after transferring from OR. Unfortunately there is no evidence of proof at this point.   Inpatient Medications  . povidone-iodine   Topical Once  . promethazine        Family History Family History  Problem Relation Age of Onset  . Hypertension Brother      Social History Social History   Social History  . Marital Status: Widowed    Spouse Name: N/A  . Number of Children: N/A  . Years of Education: N/A   Occupational History  . Not on file.   Social History Main Topics  . Smoking status: Never Smoker   . Smokeless tobacco: Never Used  . Alcohol Use: No  . Drug Use: No  . Sexual Activity: Yes   Other Topics Concern  . Not on file   Social History Narrative     Review of Systems  General:  No chills, fever, night sweats or weight changes.  Cardiovascular:  No chest pain, dyspnea on exertion, edema, orthopnea,  palpitations, paroxysmal nocturnal dyspnea. Dermatological: No rash, lesions/masses Respiratory: No cough, dyspnea Urologic: No hematuria, dysuria Abdominal:   No nausea, vomiting, diarrhea, bright red blood per rectum, melena, or hematemesis Neurologic:  No visual changes, changes in mental status. +wkns after surgery All other systems reviewed and are otherwise negative except as noted above.  Physical Exam  Blood pressure 157/89, pulse 58, temperature 97.9 F (36.6 C), temperature source Oral, resp. rate 15, height 5\' 8"  (1.727 m), weight 176 lb (79.833 kg), SpO2 100 %.  General: Pleasant, NAD. Alert and oriented, recovering from anesthesia, weak and tired, but no discomfort Psych: Normal affect. Neuro: Alert and oriented X 3. Moves all extremities spontaneously. HEENT: Normal  Neck: Supple without bruits or JVD. Lungs:  Resp regular and unlabored, CTA. Heart: RRR no s3, s4, or murmurs. Abdomen: Soft, non-tender, non-distended, BS + x 4.  Extremities: No clubbing, cyanosis or edema. DP/PT/Radials 2+ and equal bilaterally.  Labs  No results  for input(s): CKTOTAL, CKMB, TROPONINI in the last 72 hours. Lab Results  Component Value Date   WBC 7.0 09/17/2015   HGB 11.2* 09/17/2015   HCT 33.0* 09/17/2015   MCV 95.6 09/17/2015   PLT 202 09/17/2015    Recent Labs Lab 09/17/15 0931 09/17/15 1425  NA 138 139  K 4.2 3.6  CL 104  --   CO2 25  --   BUN 17  --   CREATININE 0.67  --   CALCIUM 9.5  --   PROT 6.6  --   BILITOT 0.6  --   ALKPHOS 71  --   ALT 13*  --   AST 19  --   GLUCOSE 112* 125*   No results found for: CHOL, HDL, LDLCALC, TRIG No results found for: DDIMER Cardiac Panel (last 3 results) No results for input(s): CKTOTAL, CKMB, TROPONINI, RELINDX in the last 72 hours. Radiology/Studies  Dg Chest 2 View  09/17/2015  CLINICAL DATA:  Preop chest x-ray EXAM: CHEST  2 VIEW COMPARISON:  07/18/2015 FINDINGS: Frontal imaging limited by rightward rotation. No gross  change in cardiac size or tortuous aorta contours. Coronary stents noted. Mild hyperinflated appearance of the lungs, chronic. Chronic mild scarring in the basilar lungs. There is no edema, consolidation, effusion, or pneumothorax. T8 and T11 vertebroplasty. T12 compression fracture which has occurred since 07/18/2015, with moderate height loss. IMPRESSION: 1. T12 compression fracture with moderate height loss, new from most recent comparison 07/18/2015. 2. No evidence of acute cardiopulmonary disease. Electronically Signed   By: Monte Fantasia M.D.   On: 09/17/2015 09:27   Dg Thoracolumabar Spine  09/17/2015  CLINICAL DATA:  T12 kyphoplasty for compression fracture. EXAM: THORACOLUMBAR SPINE 1V COMPARISON:  Thoracic spine MRI 06/30/2015 FINDINGS: Remote kyphoplasty changes noted at T11. New kyphoplasty changes at T12. IMPRESSION: New kyphoplasty changes at T12.  No complicating features. Electronically Signed   By: Marijo Sanes M.D.   On: 09/17/2015 16:01   Dg C-arm 61-120 Min  09/17/2015  CLINICAL DATA:  T12 kyphoplasty for compression fracture. EXAM: THORACOLUMBAR SPINE 1V COMPARISON:  Thoracic spine MRI 06/30/2015 FINDINGS: Remote kyphoplasty changes noted at T11. New kyphoplasty changes at T12. IMPRESSION: New kyphoplasty changes at T12.  No complicating features. Electronically Signed   By: Marijo Sanes M.D.   On: 09/17/2015 16:01    ECG  NSR with no significant ST-T wave changes.  ASSESSMENT AND PLAN  1. Possible SVT  - Patient has history of SVT versus atrial tachycardia, likely caused by stress during extubation process  - She was given single dose adenosine and followed by Lopressor 5 mg IV with successful conversion  - Unfortunately no current strip saved from OR is available  - Recommend transfer patient to 5N with telemetry for the next 24 hours while she is covering from surgery.   - Would resume her metoprolol 50 mg twice a day either tonight if her blood pressure allows. If  she goes into SVT again and her SBP >95, recommend use PRN 5 mg IV Lopressor to terminate SVT   2. CAD s/p PCI to RCA in 12/2012  - No recent anginal symptoms  3. HTN 4. HLD 5. DM II  Signed, Almyra Deforest, PA-C 09/17/2015, 4:41 PM    Patient seen and examined. Agree with assessment and plan. Very pleasant 69 year old WF who has a history of hypertension, type 2 diabetes mellitus, hyperlipidemia, and established coronary artery disease.  In 2014, she underwent PCI to RCA and  had mild concomitant CAD of 30% in her proximal LAD 30-40% in her ostial diagonal vessel.  Ejection fraction at that time was 45-50%.  Subsequent echo showed normalization of LV function with an ejection fraction of 55-65% with grade 1 diastolic dysfunction and mitral annular calcification.  She has a history of supraventricular tachycardia and has been on metoprolol tartrate 50 mg twice a day.  She states that she had taken her morning dose of metoprolol with a sip of water, but otherwise was NPO this morning.  Following her successful kyphoplasty procedure today while she was being extubated she developed a brief episode of probable supraventricular tachycardia.  She reverted to sinus rhythm following adenosine administration and also was given 5 mg of intravenous metoprolol.  Her ECG is unremarkable.  Telemetry presently shows sinus rhythm in the 60s with a normal blood pressure.  Unfortunate, there is no recording of the tachycardic strip, but I suspect this most likely was SVT due to response to adenosine and prompt termination.  Her postop ECG is without ischemic changes.  She denies any chest discomfort, shortness of breath or presyncope.  Laboratory reveals a potassium of 3.6 reduced from 4.2 earlier this morning.  Will give 20 meq of supplemental potassium orally.  Recommend overnight observation with telemetry and continue her oral beta blocker regimen.  If recurrent tachycardia develops I would utilize an additional dose of  IV metoprolol and her dose of oral metoprolol may need to be further increased.   Troy Sine, MD, [facility] 09/17/2015 5:13 PM

## 2015-09-17 NOTE — OR Nursing (Signed)
[place] notified of patient consult.

## 2015-09-17 NOTE — Transfer of Care (Signed)
Immediate Anesthesia Transfer of Care Note  Patient: [name]  Procedure(s) Performed: Procedure(s) with comments: T12 kyphoplasty (N/A) - T12 kyphoplasty  Patient Location: PACU  Anesthesia Type:General  Level of Consciousness: awake, alert  and oriented  Airway & Oxygen Therapy: Patient Spontanous Breathing and Patient connected to nasal cannula oxygen  Post-op Assessment: Report given to RN and Post -op Vital signs reviewed and stable  Post vital signs: Reviewed and stable  Last Vitals:  Filed Vitals:   09/17/15 0909  BP: 129/75  Pulse: 68  Temp: 36.9 C  Resp: 20    Complications: No apparent anesthesia complications

## 2015-09-18 ENCOUNTER — Encounter (HOSPITAL_COMMUNITY): Payer: Self-pay | Admitting: Orthopedic Surgery

## 2015-09-18 DIAGNOSIS — M4854XA Collapsed vertebra, not elsewhere classified, thoracic region, initial encounter for fracture: Secondary | ICD-10-CM | POA: Diagnosis not present

## 2015-09-18 LAB — GLUCOSE, CAPILLARY
GLUCOSE-CAPILLARY: 79 mg/dL (ref 65–99)
GLUCOSE-CAPILLARY: 118 mg/dL — AB (ref 65–99)

## 2015-09-18 NOTE — Plan of Care (Signed)
Problem: Consults Goal: Diagnosis - Spinal Surgery Kyphoplasty

## 2015-09-18 NOTE — Anesthesia Postprocedure Evaluation (Signed)
  Anesthesia Post-op Note  Patient: [name]  Procedure(s) Performed: Procedure(s) with comments: T12 kyphoplasty (N/A) - T12 kyphoplasty  Patient Location: PACU  Anesthesia Type:General  Level of Consciousness: awake  Airway and Oxygen Therapy: Patient Spontanous Breathing  Post-op Pain: mild  Post-op Assessment: Post-op Vital signs reviewed, Patient's Cardiovascular Status Stable, Respiratory Function Stable, Patent Airway, No signs of Nausea or vomiting and Pain level controlled LLE Motor Response: Purposeful movement, Responds to commands   RLE Motor Response: Purposeful movement, Responds to commands        Post-op Vital Signs: Reviewed and stable  Last Vitals:  Filed Vitals:   09/18/15 1011  BP: 118/77  Pulse: 98  Temp: 36.7 C  Resp: 18    Complications: No apparent anesthesia complications, SVT on emergence to be seen by Cardiology in PACU

## 2015-09-18 NOTE — Progress Notes (Signed)
    Patient doing well Minimal back pain, significantly improved since preop Had an episode of SVT yesterday upon extubation, none since   Physical Exam: Filed Vitals:   09/18/15 0600  BP: 125/61  Pulse: 67  Temp: 97.7 F (36.5 C)  Resp: 18   Patient appears very comfortable Dressing in place NVI  POD #1 s/p T12 kyphoplasty, with an episode of SVT  - encourage ambulation - Percocet for pain, Valium for muscle spasms - likely d/c home today, as long as Dr. Claiborne Billings is OK with this from a cardiac standpoint (upon discussion with Dr. Claiborne Billings this morning, he is OK with patient being discharged home)

## 2015-09-18 NOTE — Progress Notes (Signed)
Nutrition Brief Note  Patient identified on the Malnutrition Screening Tool (MST) Report  Wt Readings from Last 15 Encounters:  09/17/15 176 lb (79.833 kg)  07/23/15 176 lb 8 oz (80.06 kg)  07/15/15 180 lb (81.647 kg)  07/09/15 180 lb (81.647 kg)  07/07/15 192 lb (87.091 kg)  06/30/15 180 lb (81.647 kg)  12/08/14 212 lb 11.9 oz (96.5 kg)  11/14/14 210 lb (95.255 kg)  01/20/14 211 lb (95.709 kg)  11/13/13 209 lb 7 oz (95 kg)  07/18/13 197 lb 12.8 oz (89.721 kg)  04/16/13 185 lb 12.8 oz (84.278 kg)  01/17/13 184 lb 3.2 oz (83.553 kg)  12/26/12 202 lb (91.627 kg)  12/16/12 203 lb (92.08 kg)    Body mass index is 26.77 kg/(m^2). Patient meets criteria for overweight based on current BMI. Pt reports weight loss has been intentional.  Current diet order is carbohydrate modified. No meal completion recorded, however pt reports eating well currently and PTA with no other difficulties. Labs and medications reviewed.   No nutrition interventions warranted at this time. If nutrition issues arise, please consult RD. Plans for pt to be discharged today.   Corrin Parker, MS, RD, LDN Pager # [phone] After hours/ weekend pager # [phone]

## 2015-10-02 NOTE — Discharge Summary (Signed)
Patient ID: [name] MRN: [id number] DOB/AGE: [birth year] 69 y.o.  Admit date: 09/17/2015 Discharge date: 09/18/2015  Admission Diagnoses:  Active Problems:   Compression fracture   Surgery, elective   Discharge Diagnoses:  Same  Past Medical History  Diagnosis Date  . Hypertension   . Hypercholesteremia   . CAD (coronary artery disease)     a. NSTEMI in setting of UTI and SVT 12/2012 => LHC 01/08/13: Proximal LAD 30%, ostial diagonal 30-40%, proximal RCA 95%, inferior AK, EF 45-50% => PCI: Promus DES x 2 to RCA;  b. Echo 01/05/13: EF 30-16%, grade 1 diastolic dysfunction, MAC   . SVT (supraventricular tachycardia) ([place])   . Rectal ulceration     colonoscopy 12/2012  . Anxiety     Due to current back pain and has to lie flat.  . Arthritis   . History of blood transfusion   . Myocardial infarction ([place]) 2014  . Type II diabetes mellitus ([place])   . Compression fracture     thoracic 12  . Edema     Surgeries: Procedure(s): T12 kyphoplasty on 09/17/2015   Consultants: Treatment Team:  Rounding Lbcardiology, MD  Discharged Condition: Improved  Hospital Course: [name] is an 69 y.o. female who was admitted 09/17/2015 for operative treatment of compression fracture. Patient has severe unremitting pain that affects sleep, daily activities, and work/hobbies. After pre-op clearance the patient was taken to the operating room on 09/17/2015 and underwent  Procedure(s): T12 kyphoplasty.    Patient was given perioperative antibiotics:  Anti-infectives    Start     Dose/Rate Route Frequency Ordered Stop   09/17/15 2200  ceFAZolin (ANCEF) IVPB 1 g/50 mL premix     1 g 100 mL/hr over 30 Minutes Intravenous Every 8 hours 09/17/15 1809 09/18/15 0804   09/17/15 0853  ceFAZolin (ANCEF) IVPB 2 g/50 mL premix     2 g 100 mL/hr over 30 Minutes Intravenous On call to O.R. 09/17/15 0853 09/17/15 1400       Patient was given sequential compression devices, early  ambulation to prevent DVT.  Patient benefited maximally from hospital stay and there were no complications.    Recent vital signs: BP 118/77 mmHg  Pulse 98  Temp(Src) 98 F (36.7 C) (Oral)  Resp 18  Ht 5\' 8"  (1.727 m)  Wt 79.833 kg (176 lb)  BMI 26.77 kg/m2  SpO2 95%  Discharge Medications:     Medication List    TAKE these medications        docusate sodium 100 MG capsule  Commonly known as:  COLACE  Take 1 capsule (100 mg total) by mouth daily as needed for mild constipation.     LANTUS 100 UNIT/ML injection  Generic drug:  insulin glargine  Take 10 Units by mouth daily.     metFORMIN 1000 MG tablet  Commonly known as:  GLUCOPHAGE  Take 1 tablet by mouth 2 (two) times daily.     metoprolol 50 MG tablet  Commonly known as:  LOPRESSOR  Take 1 tablet (50 mg total) by mouth 2 (two) times daily.     multivitamin with minerals Tabs tablet  Take 1 tablet by mouth every morning.     nitroGLYCERIN 0.4 MG SL tablet  Commonly known as:  NITROSTAT  Place 1 tablet (0.4 mg total) under the tongue every 5 (five) minutes as needed for chest pain.     ondansetron 4 MG disintegrating tablet  Commonly known as:  ZOFRAN  ODT  Take 1 tablet (4 mg total) by mouth every 8 (eight) hours as needed for nausea or vomiting.     pioglitazone 30 MG tablet  Commonly known as:  ACTOS  Take 1 tablet by mouth daily.     polyethylene glycol packet  Commonly known as:  MIRALAX / GLYCOLAX  Take 17 g by mouth daily as needed.     ramipril 10 MG capsule  Commonly known as:  ALTACE  Take 1 capsule by mouth daily.     simvastatin 40 MG tablet  Commonly known as:  ZOCOR  Take 40 mg by mouth at bedtime.     Vitamin D3 1000 UNITS Caps  Take 2,000 Units by mouth every morning.        Diagnostic Studies: Dg Chest 2 View  09/17/2015  CLINICAL DATA:  Preop chest x-ray EXAM: CHEST  2 VIEW COMPARISON:  07/18/2015 FINDINGS: Frontal imaging limited by rightward rotation. No gross change in  cardiac size or tortuous aorta contours. Coronary stents noted. Mild hyperinflated appearance of the lungs, chronic. Chronic mild scarring in the basilar lungs. There is no edema, consolidation, effusion, or pneumothorax. T8 and T11 vertebroplasty. T12 compression fracture which has occurred since 07/18/2015, with moderate height loss. IMPRESSION: 1. T12 compression fracture with moderate height loss, new from most recent comparison 07/18/2015. 2. No evidence of acute cardiopulmonary disease. Electronically Signed   By: Monte Fantasia M.D.   On: 09/17/2015 09:27   Dg Thoracolumabar Spine  09/17/2015  CLINICAL DATA:  T12 kyphoplasty for compression fracture. EXAM: THORACOLUMBAR SPINE 1V COMPARISON:  Thoracic spine MRI 06/30/2015 FINDINGS: Remote kyphoplasty changes noted at T11. New kyphoplasty changes at T12. IMPRESSION: New kyphoplasty changes at T12.  No complicating features. Electronically Signed   By: Marijo Sanes M.D.   On: 09/17/2015 16:01   Dg C-arm 61-120 Min  09/17/2015  CLINICAL DATA:  T12 kyphoplasty for compression fracture. EXAM: THORACOLUMBAR SPINE 1V COMPARISON:  Thoracic spine MRI 06/30/2015 FINDINGS: Remote kyphoplasty changes noted at T11. New kyphoplasty changes at T12. IMPRESSION: New kyphoplasty changes at T12.  No complicating features. Electronically Signed   By: Marijo Sanes M.D.   On: 09/17/2015 16:01    Disposition: 01-Home or Self Care   POD #1 s/p T12 kyphoplasty, with an episode of SVT  - encourage ambulation - Percocet for pain, Valium for muscle spasms -Written scripts for pain signed and in chart -D/C instructions sheet printed and in chart -D/C today  -F/U in office 2 weeks   Signed: Justice Britain 10/02/2015, 8:25 PM

## 2016-01-26 DIAGNOSIS — H35373 Puckering of macula, bilateral: Secondary | ICD-10-CM | POA: Insufficient documentation

## 2016-01-26 DIAGNOSIS — H35379 Puckering of macula, unspecified eye: Secondary | ICD-10-CM | POA: Insufficient documentation

## 2016-03-24 ENCOUNTER — Telehealth: Payer: Self-pay | Admitting: Cardiology

## 2016-03-24 NOTE — Telephone Encounter (Signed)
Spoke with Caryl Pina and notified her that patient had grade 1 DD on last echo and provided last EF. She said patient will qualify for HF program based on this data.

## 2016-03-24 NOTE — Telephone Encounter (Signed)
New message     Calling to confirm CHF diagnosis and get most recent ER.  Please fax it to [phone]

## 2016-05-25 ENCOUNTER — Inpatient Hospital Stay (HOSPITAL_COMMUNITY)
Admission: EM | Admit: 2016-05-25 | Discharge: 2016-05-27 | DRG: 493 | Disposition: A | Discharge status: Skilled Nursing Facility | Payer: Medicare Other | Attending: Orthopedic Surgery | Admitting: Orthopedic Surgery

## 2016-05-25 ENCOUNTER — Emergency Department (HOSPITAL_COMMUNITY): Payer: Medicare Other

## 2016-05-25 ENCOUNTER — Encounter (HOSPITAL_COMMUNITY): Payer: Self-pay | Admitting: Emergency Medicine

## 2016-05-25 DIAGNOSIS — E785 Hyperlipidemia, unspecified: Secondary | ICD-10-CM | POA: Diagnosis present

## 2016-05-25 DIAGNOSIS — E876 Hypokalemia: Secondary | ICD-10-CM | POA: Diagnosis present

## 2016-05-25 DIAGNOSIS — Z885 Allergy status to narcotic agent status: Secondary | ICD-10-CM

## 2016-05-25 DIAGNOSIS — I471 Supraventricular tachycardia: Secondary | ICD-10-CM | POA: Diagnosis not present

## 2016-05-25 DIAGNOSIS — Z955 Presence of coronary angioplasty implant and graft: Secondary | ICD-10-CM

## 2016-05-25 DIAGNOSIS — I252 Old myocardial infarction: Secondary | ICD-10-CM

## 2016-05-25 DIAGNOSIS — S82301A Unspecified fracture of lower end of right tibia, initial encounter for closed fracture: Principal | ICD-10-CM | POA: Diagnosis present

## 2016-05-25 DIAGNOSIS — Z7982 Long term (current) use of aspirin: Secondary | ICD-10-CM

## 2016-05-25 DIAGNOSIS — I10 Essential (primary) hypertension: Secondary | ICD-10-CM | POA: Diagnosis present

## 2016-05-25 DIAGNOSIS — W010XXA Fall on same level from slipping, tripping and stumbling without subsequent striking against object, initial encounter: Secondary | ICD-10-CM | POA: Diagnosis present

## 2016-05-25 DIAGNOSIS — E119 Type 2 diabetes mellitus without complications: Secondary | ICD-10-CM | POA: Diagnosis present

## 2016-05-25 DIAGNOSIS — Z79899 Other long term (current) drug therapy: Secondary | ICD-10-CM

## 2016-05-25 DIAGNOSIS — Z419 Encounter for procedure for purposes other than remedying health state, unspecified: Secondary | ICD-10-CM

## 2016-05-25 DIAGNOSIS — Z9861 Coronary angioplasty status: Secondary | ICD-10-CM

## 2016-05-25 DIAGNOSIS — Y92009 Unspecified place in unspecified non-institutional (private) residence as the place of occurrence of the external cause: Secondary | ICD-10-CM

## 2016-05-25 DIAGNOSIS — E7800 Pure hypercholesterolemia, unspecified: Secondary | ICD-10-CM | POA: Diagnosis present

## 2016-05-25 DIAGNOSIS — Z8249 Family history of ischemic heart disease and other diseases of the circulatory system: Secondary | ICD-10-CM

## 2016-05-25 DIAGNOSIS — I2510 Atherosclerotic heart disease of native coronary artery without angina pectoris: Secondary | ICD-10-CM | POA: Diagnosis present

## 2016-05-25 DIAGNOSIS — Z794 Long term (current) use of insulin: Secondary | ICD-10-CM

## 2016-05-25 DIAGNOSIS — S82201A Unspecified fracture of shaft of right tibia, initial encounter for closed fracture: Secondary | ICD-10-CM

## 2016-05-25 DIAGNOSIS — S82209A Unspecified fracture of shaft of unspecified tibia, initial encounter for closed fracture: Secondary | ICD-10-CM | POA: Diagnosis present

## 2016-05-25 DIAGNOSIS — S82831A Other fracture of upper and lower end of right fibula, initial encounter for closed fracture: Secondary | ICD-10-CM | POA: Diagnosis present

## 2016-05-25 DIAGNOSIS — W19XXXA Unspecified fall, initial encounter: Secondary | ICD-10-CM

## 2016-05-25 DIAGNOSIS — F064 Anxiety disorder due to known physiological condition: Secondary | ICD-10-CM | POA: Diagnosis present

## 2016-05-25 LAB — URINALYSIS, ROUTINE W REFLEX MICROSCOPIC
Bilirubin Urine: NEGATIVE
GLUCOSE, UA: NEGATIVE mg/dL
Hgb urine dipstick: NEGATIVE
Ketones, ur: 15 mg/dL — AB
Nitrite: POSITIVE — AB
PH: 8.5 — AB (ref 5.0–8.0)
PROTEIN: 100 mg/dL — AB
SPECIFIC GRAVITY, URINE: 1.019 (ref 1.005–1.030)

## 2016-05-25 LAB — CBC WITH DIFFERENTIAL/PLATELET
Basophils Absolute: 0 10*3/uL (ref 0.0–0.1)
Basophils Relative: 0 %
Eosinophils Absolute: 0 10*3/uL (ref 0.0–0.7)
Eosinophils Relative: 0 %
HEMATOCRIT: 41.1 % (ref 36.0–46.0)
HEMOGLOBIN: 13.2 g/dL (ref 12.0–15.0)
LYMPHS ABS: 0.5 10*3/uL — AB (ref 0.7–4.0)
LYMPHS PCT: 6 %
MCH: 30.4 pg (ref 26.0–34.0)
MCHC: 32.1 g/dL (ref 30.0–36.0)
MCV: 94.7 fL (ref 78.0–100.0)
Monocytes Absolute: 0.6 10*3/uL (ref 0.1–1.0)
Monocytes Relative: 6 %
NEUTROS PCT: 88 %
Neutro Abs: 7.7 10*3/uL (ref 1.7–7.7)
Platelets: 182 10*3/uL (ref 150–400)
RBC: 4.34 MIL/uL (ref 3.87–5.11)
RDW: 13.3 % (ref 11.5–15.5)
WBC: 8.8 10*3/uL (ref 4.0–10.5)

## 2016-05-25 LAB — URINE MICROSCOPIC-ADD ON

## 2016-05-25 LAB — BASIC METABOLIC PANEL
Anion gap: 10 (ref 5–15)
BUN: 17 mg/dL (ref 6–20)
CHLORIDE: 101 mmol/L (ref 101–111)
CO2: 26 mmol/L (ref 22–32)
Calcium: 8.4 mg/dL — ABNORMAL LOW (ref 8.9–10.3)
Creatinine, Ser: 0.81 mg/dL (ref 0.44–1.00)
GFR calc Af Amer: >60 mL/min (ref >60)
GFR calc non Af Amer: >60 mL/min (ref >60)
GLUCOSE: 157 mg/dL — AB (ref 65–99)
POTASSIUM: 3.2 mmol/L — AB (ref 3.5–5.1)
Sodium: 137 mmol/L (ref 135–145)

## 2016-05-25 LAB — I-STAT CHEM 8, ED
BUN: 18 mg/dL (ref 6–20)
CHLORIDE: 101 mmol/L (ref 101–111)
Calcium, Ion: 1.07 mmol/L — ABNORMAL LOW (ref 1.12–1.23)
Creatinine, Ser: 0.7 mg/dL (ref 0.44–1.00)
Glucose, Bld: 152 mg/dL — ABNORMAL HIGH (ref 65–99)
HCT: 42 % (ref 36.0–46.0)
Hemoglobin: 14.3 g/dL (ref 12.0–15.0)
POTASSIUM: 3.2 mmol/L — AB (ref 3.5–5.1)
SODIUM: 140 mmol/L (ref 135–145)
TCO2: 25 mmol/L (ref 0–100)

## 2016-05-25 LAB — CBG MONITORING, ED: GLUCOSE-CAPILLARY: 131 mg/dL — AB (ref 65–99)

## 2016-05-25 LAB — CK: CK TOTAL: 52 U/L (ref 38–234)

## 2016-05-25 MED ORDER — CIPROFLOXACIN HCL 500 MG PO TABS
500.0000 mg | ORAL_TABLET | Freq: Two times a day (BID) | ORAL | Status: AC
  Administered 2016-05-25: 500 mg via ORAL
  Filled 2016-05-25: qty 1

## 2016-05-25 MED ORDER — ONDANSETRON HCL 4 MG/2ML IJ SOLN
4.0000 mg | Freq: Once | INTRAMUSCULAR | Status: AC
  Administered 2016-05-25: 4 mg via INTRAVENOUS
  Filled 2016-05-25: qty 2

## 2016-05-25 MED ORDER — ACETAMINOPHEN 500 MG PO TABS
1000.0000 mg | ORAL_TABLET | Freq: Once | ORAL | Status: AC
  Administered 2016-05-25: 1000 mg via ORAL
  Filled 2016-05-25: qty 2

## 2016-05-25 MED ORDER — MORPHINE SULFATE (PF) 2 MG/ML IV SOLN
2.0000 mg | Freq: Once | INTRAVENOUS | Status: AC
  Administered 2016-05-25: 2 mg via INTRAVENOUS
  Filled 2016-05-25: qty 1

## 2016-05-25 NOTE — ED Notes (Signed)
Ortho tech at the bedside.  

## 2016-05-25 NOTE — ED Notes (Signed)
Pt brought to ED by [facility] from home for a c/o a fall, pt fell today at 5 am lives alone a friend found her on the floor after 1700 and call 911, pt has bough legs swollen right more than left and has a rotation on the right leg and 10/10 pain on that leg with movement, VS BP 190/100, HR 88, R 16, SPO2 97% SR on EKG, pt unable to tell if she pass out or hit her head.

## 2016-05-25 NOTE — ED Provider Notes (Signed)
CSN: [id number]     Arrival date & time 05/25/16  1907 History   First MD Initiated Contact with Patient 05/25/16 1929     Chief Complaint  Patient presents with  . Fall     (Consider location/radiation/quality/duration/timing/severity/associated sxs/prior Treatment) Patient is a 70 y.o. female presenting with fall. The history is provided by the patient.  Fall This is a new problem. The current episode started yesterday. The problem occurs constantly. The problem has not changed since onset.Pertinent negatives include no chest pain, no headaches and no shortness of breath. Nothing aggravates the symptoms. Nothing relieves the symptoms. She has tried nothing for the symptoms. The treatment provided no relief.   70 yo F With a chief complaint of a fall. Patient thinks he tripped over something but is not sure. She was found on the ground more than 24 hours later. Covered in stool and urine. Complaining of right ankle pain. Denies any chest pain headache neck pain abdominal pain back pain. Denies fevers or chills.  Past Medical History  Diagnosis Date  . Hypertension   . Hypercholesteremia   . CAD (coronary artery disease)     a. NSTEMI in setting of UTI and SVT 12/2012 => LHC 01/08/13: Proximal LAD 30%, ostial diagonal 30-40%, proximal RCA 95%, inferior AK, EF 45-50% => PCI: Promus DES x 2 to RCA;  b. Echo 01/05/13: EF [id number], grade 1 diastolic dysfunction, MAC   . SVT (supraventricular tachycardia) ([place])   . Rectal ulceration     colonoscopy 12/2012  . Anxiety     Due to current back pain and has to lie flat.  . Arthritis   . History of blood transfusion   . Myocardial infarction ([place]) 2014  . Type II diabetes mellitus ([place])   . Compression fracture     thoracic 12  . Edema    Past Surgical History  Procedure Laterality Date  . Eye surgery Bilateral "many"  . Tubal ligation    . Kyphoplasty  12/27/2012    Procedure: KYPHOPLASTY;  Surgeon: Sinclair Ship, MD;  Location: [place];   Service: Orthopedics;  Laterality: Bilateral;  T-8 kyphoplasty  . Colonoscopy Left 01/12/2013    Procedure: COLONOSCOPY;  Surgeon: Wonda Horner, MD;  Location: [facility] ENDOSCOPY;  Service: Endoscopy;  Laterality: Left;  . Colonoscopy N/A 01/14/2013    Procedure: COLONOSCOPY;  Surgeon: Wonda Horner, MD;  Location: [facility] ENDOSCOPY;  Service: Endoscopy;  Laterality: N/A;  Rm 2034, attemped Colon on 2-15=inadequate prep  . Coronary angioplasty  12/2012  . Left heart catheterization with coronary angiogram N/A 01/08/2013    Procedure: LEFT HEART CATHETERIZATION WITH CORONARY ANGIOGRAM;  Surgeon: Thayer Headings, MD;  Location: [facility] CATH LAB;  Service: Cardiovascular;  Laterality: N/A;  . Percutaneous stent intervention N/A 01/15/2013    Procedure: PERCUTANEOUS STENT INTERVENTION;  Surgeon: Peter M Martinique, MD;  Location: [facility] CATH LAB;  Service: Cardiovascular;  Laterality: N/A;  . Orif hip fracture Left 11/15/2014    Procedure: OPEN REDUCTION INTERNAL FIXATION HIP INTERTROCH;  Surgeon: Mcarthur Rossetti, MD;  Location: [place];  Service: Orthopedics;  Laterality: Left;  Marland Kitchen Kyphoplasty N/A 07/15/2015    Procedure: T11 KYPHOPLASTY;  Surgeon: Phylliss Bob, MD;  Location: [place];  Service: Orthopedics;  Laterality: N/A;  T11 kyphoplasty  . Cataract extraction, bilateral Bilateral   . Kyphoplasty N/A 09/17/2015    Procedure: T12 kyphoplasty;  Surgeon: Phylliss Bob, MD;  Location: [place];  Service: Orthopedics;  Laterality: N/A;  T12 kyphoplasty  Family History  Problem Relation Age of Onset  . Hypertension Brother    Social History  Substance Use Topics  . Smoking status: Never Smoker   . Smokeless tobacco: Never Used  . Alcohol Use: No   OB History    No data available     Review of Systems  Constitutional: Negative for fever and chills.  HENT: Negative for congestion and rhinorrhea.   Eyes: Negative for redness and visual disturbance.  Respiratory: Negative for shortness of breath and wheezing.    Cardiovascular: Negative for chest pain and palpitations.  Gastrointestinal: Negative for nausea and vomiting.  Genitourinary: Negative for dysuria and urgency.  Musculoskeletal: Positive for myalgias and arthralgias.  Skin: Negative for pallor and wound.  Neurological: Negative for dizziness and headaches.      Allergies  Oxycodone  Home Medications   Prior to Admission medications   Medication Sig Start Date End Date Taking? Authorizing Provider  aspirin EC 81 MG tablet Take 81 mg by mouth daily.   Yes Historical Provider, MD  docusate sodium (COLACE) 100 MG capsule Take 1 capsule (100 mg total) by mouth daily as needed for mild constipation. 06/22/15  Yes Pamella Pert, MD  furosemide (LASIX) 40 MG tablet Take 40 mg by mouth daily.   Yes Historical Provider, MD  LANTUS 100 UNIT/ML injection Take 10 Units by mouth daily. 08/06/15  Yes Historical Provider, MD  metFORMIN (GLUCOPHAGE) 1000 MG tablet Take 1 tablet by mouth 2 (two) times daily. 08/26/15  Yes Historical Provider, MD  metoprolol (LOPRESSOR) 50 MG tablet Take 1 tablet (50 mg total) by mouth 2 (two) times daily. 09/14/15  Yes Minus Breeding, MD  nitroGLYCERIN (NITROSTAT) 0.4 MG SL tablet Place 1 tablet (0.4 mg total) under the tongue every 5 (five) minutes as needed for chest pain. 04/16/13  Yes Liliane Shi, PA-C  ondansetron (ZOFRAN ODT) 4 MG disintegrating tablet Take 1 tablet (4 mg total) by mouth every 8 (eight) hours as needed for nausea or vomiting. 06/28/15  Yes Jule Ser, DO  pioglitazone (ACTOS) 30 MG tablet Take 1 tablet by mouth daily. 09/08/15  Yes Historical Provider, MD  ramipril (ALTACE) 10 MG capsule Take 1 capsule by mouth daily. 08/26/15  Yes Historical Provider, MD  simvastatin (ZOCOR) 40 MG tablet Take 40 mg by mouth at bedtime.   Yes Historical Provider, MD  vitamin B-12 (CYANOCOBALAMIN) 1000 MCG tablet Take 1,000 mcg by mouth daily.   Yes Historical Provider, MD   BP 141/77 mmHg  Pulse 98   Temp(Src) 96.4 F (35.8 C) (Rectal)  Resp 21  Ht 5' 8.5" (1.74 m)  Wt 200 lb (90.719 kg)  BMI 29.96 kg/m2  SpO2 95% Physical Exam  Constitutional: She is oriented to person, place, and time. She appears well-developed and well-nourished. No distress.  HENT:  Head: Normocephalic and atraumatic.  Eyes: EOM are normal. Pupils are equal, round, and reactive to light.  Neck: Normal range of motion. Neck supple.  Cardiovascular: Normal rate and regular rhythm.  Exam reveals no gallop and no friction rub.   No murmur heard. Pulmonary/Chest: Effort normal. She has no wheezes. She has no rales.  Abdominal: Soft. She exhibits no distension. There is no tenderness. There is no rebound and no guarding.  Musculoskeletal: She exhibits edema and tenderness.  Tenderness and swelling to the right lower extremity just above the ankle. Pulse motor and sensation intact distally.  Neurological: She is alert and oriented to person, place, and time.  Skin: Skin  is warm and dry. She is not diaphoretic.  Psychiatric: She has a normal mood and affect. Her behavior is normal.  Nursing note and vitals reviewed.   ED Course  Reduction of fracture Date/Time: 05/25/2016 11:47 PM Performed by: Tyrone Nine Mushka Laconte Authorized by: Deno Etienne Consent: Verbal consent obtained. Risks and benefits: risks, benefits and alternatives were discussed Consent given by: patient Required items: required blood products, implants, devices, and special equipment available Patient identity confirmed: verbally with patient Time out: Immediately prior to procedure a "time out" was called to verify the correct patient, procedure, equipment, support staff and site/side marked as required. Local anesthesia used: no Patient sedated: no Patient tolerance: Patient tolerated the procedure well with no immediate complications   (including critical care time) Labs Review Labs Reviewed  CBC WITH DIFFERENTIAL/PLATELET - Abnormal; Notable for the  following:    Lymphs Abs 0.5 (*)    All other components within normal limits  BASIC METABOLIC PANEL - Abnormal; Notable for the following:    Potassium 3.2 (*)    Glucose, Bld 157 (*)    Calcium 8.4 (*)    All other components within normal limits  URINALYSIS, ROUTINE W REFLEX MICROSCOPIC (NOT AT [facility]) - Abnormal; Notable for the following:    APPearance CLOUDY (*)    pH 8.5 (*)    Ketones, ur 15 (*)    Protein, ur 100 (*)    Nitrite POSITIVE (*)    Leukocytes, UA MODERATE (*)    All other components within normal limits  URINE MICROSCOPIC-ADD ON - Abnormal; Notable for the following:    Squamous Epithelial / LPF 0-5 (*)    Bacteria, UA MANY (*)    Crystals TRIPLE PHOSPHATE CRYSTALS (*)    All other components within normal limits  I-STAT CHEM 8, ED - Abnormal; Notable for the following:    Potassium 3.2 (*)    Glucose, Bld 152 (*)    Calcium, Ion 1.07 (*)    All other components within normal limits  CBG MONITORING, ED - Abnormal; Notable for the following:    Glucose-Capillary 131 (*)    All other components within normal limits  CK    Imaging Review Dg Chest 1 View  05/25/2016  CLINICAL DATA:  Fall at home this evening. EXAM: CHEST 1 VIEW COMPARISON:  09/17/2015 FINDINGS: The lungs are clear wiithout focal pneumonia, edema, pneumothorax or pleural effusion. The cardio pericardial silhouette is enlarged. Patient is status post multilevel thoracic vertebral augmentation. IMPRESSION: No acute cardiopulmonary findings. Electronically Signed   By: Misty Stanley M.D.   On: 05/25/2016 21:05   Dg Tibia/fibula Right  05/25/2016  CLINICAL DATA:  [place] at home this evening.  Initial encounter. EXAM: RIGHT TIBIA AND FIBULA - 2 VIEW COMPARISON:  None. FINDINGS: Comminuted fracture proximal fibula is assisted with a comminuted fracture the distal tibia. Bones are demineralized. IMPRESSION: Comminuted fractures of the proximal fibula and distal tibia. Electronically Signed   By: Misty Stanley M.D.   On: 05/25/2016 21:02   Dg Ankle 2 Views Right  05/25/2016  CLINICAL DATA:  Fall at home.  Initial encounter. EXAM: RIGHT ANKLE - 2 VIEW COMPARISON:  None. FINDINGS: Two-view exam of the right ankle shows a comminuted distal tibia fracture without obvious extension into the ankle mortise. Mild apex medial angulation evident. Bones are diffusely demineralized. IMPRESSION: Comminuted slightly angulated fracture the distal tibia. Electronically Signed   By: Misty Stanley M.D.   On: 05/25/2016 21:03   I have  personally reviewed and evaluated these images and lab results as part of my medical decision-making.   EKG Interpretation   Date/Time:  Wednesday May 25 2016 19:23:46 EDT Ventricular Rate:  94 PR Interval:    QRS Duration: 98 QT Interval:  399 QTC Calculation: 505 R Axis:   28 Text Interpretation:  Sinus arrhythmia Abnormal R-wave progression, early  transition Borderline repolarization abnormality Prolonged QT interval  Artifact in lead(s) I II III aVR aVL background noise Otherwise no  significant change Confirmed by Bridgette Wolden MD, Quillian Quince [id number]) on 05/25/2016  7:28:50 PM      MDM   Final diagnoses:  Fall, initial encounter  Tibial fracture, right, closed, initial encounter    70 yo F with a chief complaint of a fall. Will obtain a plain film of the right ankle. Because the patient is unsure of the events that occurred will obtain a CT of the head. Will obtain a CK.  Found to have a tib fib fracture significantly displaced. Discussed with Dr. Marlou Sa will take to the OR.   The patients results and plan were reviewed and discussed.   Any x-rays performed were independently reviewed by myself.   Differential diagnosis were considered with the presenting HPI.  Medications  ciprofloxacin (CIPRO) tablet 500 mg (500 mg Oral Given 05/25/16 2323)  morphine 2 MG/ML injection 2 mg (2 mg Intravenous Given 05/25/16 2102)  acetaminophen (TYLENOL) tablet 1,000 mg (1,000 mg Oral  Given 05/25/16 2102)  ondansetron (ZOFRAN) injection 4 mg (4 mg Intravenous Given 05/25/16 2102)    Filed Vitals:   05/25/16 2200 05/25/16 2245 05/25/16 2300 05/25/16 2330  BP: 174/67 149/81  141/77  Pulse: 99 89 100 98  Temp:      TempSrc:      Resp: 23 16 19 21   Height:      Weight:      SpO2: 97% 98% 98% 95%    Final diagnoses:  Fall, initial encounter  Tibial fracture, right, closed, initial encounter    Admission/ observation were discussed with the admitting physician, patient and/or family and they are comfortable with the plan.    Deno Etienne, DO 05/25/16 2347

## 2016-05-25 NOTE — ED Notes (Signed)
Pt's oxygen sats 86% on room air, pt placed on 2L oxygen 

## 2016-05-25 NOTE — ED Notes (Signed)
Patient denies pain and is resting comfortably.  

## 2016-05-25 NOTE — Progress Notes (Signed)
Orthopedic Tech Progress Note Patient Details:  [name] 1946-05-22 [id number] Applied fiberglass posterior short leg splint and fiberglass stirrup splint to RLE.  Pulses, motion, sensation intact before and after splinting.  Capillary refill less than 2 seconds before and after splinting. Ortho Devices Type of Ortho Device: Post (short leg) splint, Stirrup splint Ortho Device/Splint Location: RLE Ortho Device/Splint Interventions: Application   Darrol Poke 05/25/2016, 10:13 PM

## 2016-05-25 NOTE — ED Notes (Signed)
Ortho tech called and notified about splint need.

## 2016-05-25 NOTE — H&P (Signed)
[name] is an 70 y.o. female.   Chief Complaint: Right leg pain HPI: [name] is a 70 year old amatory female who lives alone who fell when she is going to the refrigerator this morning.  She actually was not able to be reached until later on today.  This was by her friend.  She denies any loss of consciousness.  She does report right leg pain.  She was evaluated in the emergency room and her leg was reduced and splinted.  She denies any other orthopedic complaints.  She does have some past medical history of cardiac issues as well as insulin-dependent diabetes.  Past Medical History  Diagnosis Date  . Hypertension   . Hypercholesteremia   . CAD (coronary artery disease)     a. NSTEMI in setting of UTI and SVT 12/2012 => LHC 01/08/13: Proximal LAD 30%, ostial diagonal 30-40%, proximal RCA 95%, inferior AK, EF 45-50% => PCI: Promus DES x 2 to RCA;  b. Echo 01/05/13: EF 92-33%, grade 1 diastolic dysfunction, MAC   . SVT (supraventricular tachycardia) ([place])   . Rectal ulceration     colonoscopy 12/2012  . Anxiety     Due to current back pain and has to lie flat.  . Arthritis   . History of blood transfusion   . Myocardial infarction ([place]) 2014  . Type II diabetes mellitus ([place])   . Compression fracture     thoracic 12  . Edema     Past Surgical History  Procedure Laterality Date  . Eye surgery Bilateral "many"  . Tubal ligation    . Kyphoplasty  12/27/2012    Procedure: KYPHOPLASTY;  Surgeon: Sinclair Ship, MD;  Location: [place];  Service: Orthopedics;  Laterality: Bilateral;  T-8 kyphoplasty  . Colonoscopy Left 01/12/2013    Procedure: COLONOSCOPY;  Surgeon: Wonda Horner, MD;  Location: [facility] ENDOSCOPY;  Service: Endoscopy;  Laterality: Left;  . Colonoscopy N/A 01/14/2013    Procedure: COLONOSCOPY;  Surgeon: Wonda Horner, MD;  Location: [facility] ENDOSCOPY;  Service: Endoscopy;  Laterality: N/A;  Rm 2034, attemped Colon on 2-15=inadequate prep  . Coronary angioplasty  12/2012  .  Left heart catheterization with coronary angiogram N/A 01/08/2013    Procedure: LEFT HEART CATHETERIZATION WITH CORONARY ANGIOGRAM;  Surgeon: Thayer Headings, MD;  Location: [place] CATH LAB;  Service: Cardiovascular;  Laterality: N/A;  . Percutaneous stent intervention N/A 01/15/2013    Procedure: PERCUTANEOUS STENT INTERVENTION;  Surgeon: Peter M Martinique, MD;  Location: [facility] CATH LAB;  Service: Cardiovascular;  Laterality: N/A;  . Orif hip fracture Left 11/15/2014    Procedure: OPEN REDUCTION INTERNAL FIXATION HIP INTERTROCH;  Surgeon: Mcarthur Rossetti, MD;  Location: [place];  Service: Orthopedics;  Laterality: Left;  Marland Kitchen Kyphoplasty N/A 07/15/2015    Procedure: T11 KYPHOPLASTY;  Surgeon: Phylliss Bob, MD;  Location: [place];  Service: Orthopedics;  Laterality: N/A;  T11 kyphoplasty  . Cataract extraction, bilateral Bilateral   . Kyphoplasty N/A 09/17/2015    Procedure: T12 kyphoplasty;  Surgeon: Phylliss Bob, MD;  Location: [place];  Service: Orthopedics;  Laterality: N/A;  T12 kyphoplasty    Family History  Problem Relation Age of Onset  . Hypertension Brother    Social History:  reports that she has never smoked. She has never used smokeless tobacco. She reports that she does not drink alcohol or use illicit drugs.  Allergies:  Allergies  Allergen Reactions  . Oxycodone Nausea Only     (Not in a hospital  admission)  Results for orders placed or performed during the hospital encounter of 05/25/16 (from the past 48 hour(s))  CBG monitoring, ED     Status: Abnormal   Collection Time: 05/25/16  7:34 PM  Result Value Ref Range   Glucose-Capillary 131 (H) 65 - 99 mg/dL  Urinalysis, Routine w reflex microscopic (not at [facility])     Status: Abnormal   Collection Time: 05/25/16  7:45 PM  Result Value Ref Range   Color, Urine YELLOW YELLOW   APPearance CLOUDY (A) CLEAR   Specific Gravity, Urine 1.019 1.005 - 1.030   pH 8.5 (H) 5.0 - 8.0   Glucose, UA NEGATIVE NEGATIVE mg/dL   Hgb urine  dipstick NEGATIVE NEGATIVE   Bilirubin Urine NEGATIVE NEGATIVE   Ketones, ur 15 (A) NEGATIVE mg/dL   Protein, ur 100 (A) NEGATIVE mg/dL   Nitrite POSITIVE (A) NEGATIVE   Leukocytes, UA MODERATE (A) NEGATIVE  Urine microscopic-add on     Status: Abnormal   Collection Time: 05/25/16  7:45 PM  Result Value Ref Range   Squamous Epithelial / LPF 0-5 (A) NONE SEEN   WBC, UA 6-30 0 - 5 WBC/hpf   RBC / HPF 0-5 0 - 5 RBC/hpf   Bacteria, UA MANY (A) NONE SEEN   Crystals TRIPLE PHOSPHATE CRYSTALS (A) NEGATIVE  CBC with Differential     Status: Abnormal   Collection Time: 05/25/16  8:06 PM  Result Value Ref Range   WBC 8.8 4.0 - 10.5 K/uL   RBC 4.34 3.87 - 5.11 MIL/uL   Hemoglobin 13.2 12.0 - 15.0 g/dL   HCT 41.1 36.0 - 46.0 %   MCV 94.7 78.0 - 100.0 fL   MCH 30.4 26.0 - 34.0 pg   MCHC 32.1 30.0 - 36.0 g/dL   RDW 13.3 11.5 - 15.5 %   Platelets 182 150 - 400 K/uL   Neutrophils Relative % 88 %   Neutro Abs 7.7 1.7 - 7.7 K/uL   Lymphocytes Relative 6 %   Lymphs Abs 0.5 (L) 0.7 - 4.0 K/uL   Monocytes Relative 6 %   Monocytes Absolute 0.6 0.1 - 1.0 K/uL   Eosinophils Relative 0 %   Eosinophils Absolute 0.0 0.0 - 0.7 K/uL   Basophils Relative 0 %   Basophils Absolute 0.0 0.0 - 0.1 K/uL  Basic metabolic panel     Status: Abnormal   Collection Time: 05/25/16  8:06 PM  Result Value Ref Range   Sodium 137 135 - 145 mmol/L   Potassium 3.2 (L) 3.5 - 5.1 mmol/L   Chloride 101 101 - 111 mmol/L   CO2 26 22 - 32 mmol/L   Glucose, Bld 157 (H) 65 - 99 mg/dL   BUN 17 6 - 20 mg/dL   Creatinine, Ser 0.81 0.44 - 1.00 mg/dL   Calcium 8.4 (L) 8.9 - 10.3 mg/dL   GFR calc non Af Amer >60 >60 mL/min   GFR calc Af Amer >60 >60 mL/min    Comment: (NOTE) The eGFR has been calculated using the CKD EPI equation. This calculation has not been validated in all clinical situations. eGFR's persistently <60 mL/min signify possible Chronic Kidney Disease.    Anion gap 10 5 - 15  CK     Status: None    Collection Time: 05/25/16  8:06 PM  Result Value Ref Range   Total CK 52 38 - 234 U/L  I-Stat Chem 8, ED     Status: Abnormal   Collection Time: 05/25/16  8:14 PM  Result Value Ref Range   Sodium 140 135 - 145 mmol/L   Potassium 3.2 (L) 3.5 - 5.1 mmol/L   Chloride 101 101 - 111 mmol/L   BUN 18 6 - 20 mg/dL   Creatinine, Ser 0.70 0.44 - 1.00 mg/dL   Glucose, Bld 152 (H) 65 - 99 mg/dL   Calcium, Ion 1.07 (L) 1.12 - 1.23 mmol/L   TCO2 25 0 - 100 mmol/L   Hemoglobin 14.3 12.0 - 15.0 g/dL   HCT 42.0 36.0 - 46.0 %   Dg Chest 1 View  05/25/2016  CLINICAL DATA:  Fall at home this evening. EXAM: CHEST 1 VIEW COMPARISON:  09/17/2015 FINDINGS: The lungs are clear wiithout focal pneumonia, edema, pneumothorax or pleural effusion. The cardio pericardial silhouette is enlarged. Patient is status post multilevel thoracic vertebral augmentation. IMPRESSION: No acute cardiopulmonary findings. Electronically Signed   By: Misty Stanley M.D.   On: 05/25/2016 21:05   Dg Tibia/fibula Right  05/25/2016  CLINICAL DATA:  [place] at home this evening.  Initial encounter. EXAM: RIGHT TIBIA AND FIBULA - 2 VIEW COMPARISON:  None. FINDINGS: Comminuted fracture proximal fibula is assisted with a comminuted fracture the distal tibia. Bones are demineralized. IMPRESSION: Comminuted fractures of the proximal fibula and distal tibia. Electronically Signed   By: Misty Stanley M.D.   On: 05/25/2016 21:02   Dg Ankle 2 Views Right  05/25/2016  CLINICAL DATA:  Fall at home.  Initial encounter. EXAM: RIGHT ANKLE - 2 VIEW COMPARISON:  None. FINDINGS: Two-view exam of the right ankle shows a comminuted distal tibia fracture without obvious extension into the ankle mortise. Mild apex medial angulation evident. Bones are diffusely demineralized. IMPRESSION: Comminuted slightly angulated fracture the distal tibia. Electronically Signed   By: Misty Stanley M.D.   On: 05/25/2016 21:03    Review of Systems  Constitutional: Negative.    HENT: Negative.   Eyes: Negative.   Respiratory: Negative.   Cardiovascular: Negative.   Gastrointestinal: Negative.   Genitourinary: Negative.   Musculoskeletal: Positive for joint pain.  Skin: Negative.   Neurological: Negative.   Endo/Heme/Allergies: Negative.   Psychiatric/Behavioral: Negative.     Blood pressure 174/67, pulse 99, temperature 96.4 F (35.8 C), temperature source Rectal, resp. rate 23, height 5' 8.5" (1.74 m), weight 90.719 kg (200 lb), SpO2 97 %. Physical Exam  Constitutional: She appears well-developed.  HENT:  Head: Normocephalic.  Eyes: Pupils are equal, round, and reactive to light.  Neck: Normal range of motion.  Cardiovascular: Normal rate.   Respiratory: Effort normal.  Neurological: She is alert.  Skin: Skin is warm.  Psychiatric: She has a normal mood and affect.   musculoskeletal examination demonstrates good range of motion and bilateral wrists elbows and shoulders without abrasions or crepitus.  Radial pulses intact bilaterally.  Grip strength intact bilaterally with good biceps and triceps strength.  She has good neck range of motion without crepitus pain or tenderness.  Left lower extremity demonstrates full range of motion ankle knee and hip.  Pedal pulses palpable.  Sensation intact on dorsal plantar aspect of the left foot right foot is splinted however pedal pulses palpable sensation is intact on the dorsal plantar aspect of the exposed forefoot.  Knee range of motion is intact no groin pain with internal/external rotation on the right-hand side   Assessment/Plan Impression is right distal tib-fib fracture with some comminution and osteopenia.  Patient is on calcium and vitamin D.  Patient also has medical comorbidities which  increase her risk including diabetes and history of coronary disease.  Compartment soft at this time no evidence of compartment syndrome.  This was a low energy mechanism of injury.  Patient may also have urinary tract  infection.  Plan at this time is rehydration and admission.  Okay for patient to eat tonight.  Nothing by mouth after 9 a.m.  Plan for intramedullary nailing tomorrow.  Risks and benefits discussed with the patient including but limited to infection or vessel damage malunion nonunion as well as potential need for more surgery.  Patient understands the risks and benefits all questions answered Meredith Pel, MD 05/25/2016, 10:23 PM

## 2016-05-25 NOTE — ED Notes (Signed)
Patient transported to X-ray 

## 2016-05-26 ENCOUNTER — Inpatient Hospital Stay (HOSPITAL_COMMUNITY): Payer: Medicare Other | Admitting: Anesthesiology

## 2016-05-26 ENCOUNTER — Inpatient Hospital Stay (HOSPITAL_COMMUNITY): Payer: Medicare Other

## 2016-05-26 ENCOUNTER — Emergency Department (HOSPITAL_COMMUNITY): Payer: Medicare Other

## 2016-05-26 ENCOUNTER — Encounter (HOSPITAL_COMMUNITY)
Admission: EM | Disposition: A | Discharge status: Skilled Nursing Facility | Payer: Self-pay | Source: Home / Self Care | Attending: Orthopedic Surgery

## 2016-05-26 ENCOUNTER — Encounter (HOSPITAL_COMMUNITY): Payer: Self-pay | Admitting: Certified Registered Nurse Anesthetist

## 2016-05-26 DIAGNOSIS — E7800 Pure hypercholesterolemia, unspecified: Secondary | ICD-10-CM | POA: Diagnosis not present

## 2016-05-26 DIAGNOSIS — Z8249 Family history of ischemic heart disease and other diseases of the circulatory system: Secondary | ICD-10-CM | POA: Diagnosis not present

## 2016-05-26 DIAGNOSIS — S82301A Unspecified fracture of lower end of right tibia, initial encounter for closed fracture: Secondary | ICD-10-CM | POA: Diagnosis not present

## 2016-05-26 DIAGNOSIS — S82831A Other fracture of upper and lower end of right fibula, initial encounter for closed fracture: Secondary | ICD-10-CM | POA: Diagnosis not present

## 2016-05-26 DIAGNOSIS — I471 Supraventricular tachycardia: Secondary | ICD-10-CM | POA: Diagnosis not present

## 2016-05-26 DIAGNOSIS — Z79899 Other long term (current) drug therapy: Secondary | ICD-10-CM | POA: Diagnosis not present

## 2016-05-26 DIAGNOSIS — I5032 Chronic diastolic (congestive) heart failure: Secondary | ICD-10-CM

## 2016-05-26 DIAGNOSIS — I252 Old myocardial infarction: Secondary | ICD-10-CM | POA: Diagnosis not present

## 2016-05-26 DIAGNOSIS — I2510 Atherosclerotic heart disease of native coronary artery without angina pectoris: Secondary | ICD-10-CM | POA: Diagnosis not present

## 2016-05-26 DIAGNOSIS — S82209A Unspecified fracture of shaft of unspecified tibia, initial encounter for closed fracture: Secondary | ICD-10-CM | POA: Diagnosis present

## 2016-05-26 DIAGNOSIS — E785 Hyperlipidemia, unspecified: Secondary | ICD-10-CM | POA: Diagnosis not present

## 2016-05-26 DIAGNOSIS — Z885 Allergy status to narcotic agent status: Secondary | ICD-10-CM | POA: Diagnosis not present

## 2016-05-26 DIAGNOSIS — E876 Hypokalemia: Secondary | ICD-10-CM | POA: Diagnosis not present

## 2016-05-26 DIAGNOSIS — F064 Anxiety disorder due to known physiological condition: Secondary | ICD-10-CM | POA: Diagnosis not present

## 2016-05-26 DIAGNOSIS — Z955 Presence of coronary angioplasty implant and graft: Secondary | ICD-10-CM | POA: Diagnosis not present

## 2016-05-26 DIAGNOSIS — W010XXA Fall on same level from slipping, tripping and stumbling without subsequent striking against object, initial encounter: Secondary | ICD-10-CM | POA: Diagnosis present

## 2016-05-26 DIAGNOSIS — Z794 Long term (current) use of insulin: Secondary | ICD-10-CM | POA: Diagnosis not present

## 2016-05-26 DIAGNOSIS — Z7982 Long term (current) use of aspirin: Secondary | ICD-10-CM | POA: Diagnosis not present

## 2016-05-26 DIAGNOSIS — I10 Essential (primary) hypertension: Secondary | ICD-10-CM

## 2016-05-26 DIAGNOSIS — E119 Type 2 diabetes mellitus without complications: Secondary | ICD-10-CM | POA: Diagnosis not present

## 2016-05-26 DIAGNOSIS — Y92009 Unspecified place in unspecified non-institutional (private) residence as the place of occurrence of the external cause: Secondary | ICD-10-CM | POA: Diagnosis not present

## 2016-05-26 HISTORY — PX: TIBIA IM NAIL INSERTION: SHX2516

## 2016-05-26 LAB — GLUCOSE, CAPILLARY
GLUCOSE-CAPILLARY: 126 mg/dL — AB (ref 65–99)
Glucose-Capillary: 85 mg/dL (ref 65–99)
Glucose-Capillary: 105 mg/dL — ABNORMAL HIGH (ref 65–99)
Glucose-Capillary: 105 mg/dL — ABNORMAL HIGH (ref 65–99)

## 2016-05-26 LAB — MRSA PCR SCREENING: MRSA by PCR: NEGATIVE

## 2016-05-26 SURGERY — INSERTION, INTRAMEDULLARY ROD, TIBIA
Anesthesia: General | Site: Leg Lower | Laterality: Right

## 2016-05-26 MED ORDER — FUROSEMIDE 40 MG PO TABS
40.0000 mg | ORAL_TABLET | Freq: Every day | ORAL | Status: DC
  Administered 2016-05-27: 40 mg via ORAL
  Filled 2016-05-26: qty 1

## 2016-05-26 MED ORDER — PROPOFOL 10 MG/ML IV BOLUS
INTRAVENOUS | Status: DC | PRN
  Administered 2016-05-26: 120 mg via INTRAVENOUS

## 2016-05-26 MED ORDER — ONDANSETRON HCL 4 MG/2ML IJ SOLN
INTRAMUSCULAR | Status: AC
  Filled 2016-05-26: qty 2

## 2016-05-26 MED ORDER — MIDAZOLAM HCL 2 MG/2ML IJ SOLN
INTRAMUSCULAR | Status: AC
  Filled 2016-05-26: qty 2

## 2016-05-26 MED ORDER — INSULIN GLARGINE 100 UNIT/ML ~~LOC~~ SOLN
10.0000 [IU] | Freq: Every day | SUBCUTANEOUS | Status: DC
  Filled 2016-05-26 (×3): qty 0.1

## 2016-05-26 MED ORDER — PHENYLEPHRINE HCL 10 MG/ML IJ SOLN
INTRAMUSCULAR | Status: DC | PRN
  Administered 2016-05-26 (×4): 80 ug via INTRAVENOUS

## 2016-05-26 MED ORDER — SIMVASTATIN 40 MG PO TABS
40.0000 mg | ORAL_TABLET | Freq: Every day | ORAL | Status: DC
  Administered 2016-05-26: 40 mg via ORAL
  Filled 2016-05-26: qty 1

## 2016-05-26 MED ORDER — FENTANYL CITRATE (PF) 250 MCG/5ML IJ SOLN
INTRAMUSCULAR | Status: DC | PRN
  Administered 2016-05-26: 150 ug via INTRAVENOUS

## 2016-05-26 MED ORDER — METFORMIN HCL 500 MG PO TABS
1000.0000 mg | ORAL_TABLET | Freq: Two times a day (BID) | ORAL | Status: DC
  Administered 2016-05-27: 1000 mg via ORAL
  Filled 2016-05-26: qty 2

## 2016-05-26 MED ORDER — ESMOLOL HCL 100 MG/10ML IV SOLN
INTRAVENOUS | Status: AC
  Filled 2016-05-26: qty 10

## 2016-05-26 MED ORDER — LIDOCAINE HCL (CARDIAC) 20 MG/ML IV SOLN
INTRAVENOUS | Status: DC | PRN
  Administered 2016-05-26: 100 mg via INTRAVENOUS

## 2016-05-26 MED ORDER — SUCCINYLCHOLINE CHLORIDE 20 MG/ML IJ SOLN
INTRAMUSCULAR | Status: DC | PRN
  Administered 2016-05-26: 120 mg via INTRAVENOUS

## 2016-05-26 MED ORDER — LIDOCAINE 2% (20 MG/ML) 5 ML SYRINGE
INTRAMUSCULAR | Status: AC
  Filled 2016-05-26: qty 5

## 2016-05-26 MED ORDER — PHENYLEPHRINE HCL 10 MG/ML IJ SOLN
10.0000 mg | INTRAVENOUS | Status: DC | PRN
  Administered 2016-05-26: 60 ug/min via INTRAVENOUS

## 2016-05-26 MED ORDER — DOCUSATE SODIUM 100 MG PO CAPS: 100.0000 mg | ORAL_CAPSULE | Freq: Every day | ORAL | Status: DC | PRN

## 2016-05-26 MED ORDER — LACTATED RINGERS IV SOLN
INTRAVENOUS | Status: DC
  Administered 2016-05-26 (×3): via INTRAVENOUS

## 2016-05-26 MED ORDER — PROMETHAZINE HCL 25 MG/ML IJ SOLN
INTRAMUSCULAR | Status: AC
  Administered 2016-05-26: 6.25 mg via INTRAVENOUS
  Filled 2016-05-26: qty 1

## 2016-05-26 MED ORDER — PROPOFOL 10 MG/ML IV BOLUS
INTRAVENOUS | Status: AC
  Filled 2016-05-26: qty 20

## 2016-05-26 MED ORDER — MIDAZOLAM HCL 5 MG/5ML IJ SOLN
INTRAMUSCULAR | Status: DC | PRN
  Administered 2016-05-26: 2 mg via INTRAVENOUS

## 2016-05-26 MED ORDER — METOPROLOL TARTRATE 5 MG/5ML IV SOLN
INTRAVENOUS | Status: AC
  Administered 2016-05-26: 5 mg via INTRAVENOUS
  Filled 2016-05-26: qty 5

## 2016-05-26 MED ORDER — CEFAZOLIN SODIUM-DEXTROSE 2-4 GM/100ML-% IV SOLN
2.0000 g | INTRAVENOUS | Status: AC
  Administered 2016-05-26: 2 g via INTRAVENOUS
  Filled 2016-05-26 (×2): qty 100

## 2016-05-26 MED ORDER — ONDANSETRON HCL 4 MG/2ML IJ SOLN: 4.0000 mg | Freq: Four times a day (QID) | INTRAMUSCULAR | Status: DC | PRN

## 2016-05-26 MED ORDER — RAMIPRIL 10 MG PO CAPS
10.0000 mg | ORAL_CAPSULE | Freq: Every day | ORAL | Status: DC
  Administered 2016-05-27: 10 mg via ORAL
  Filled 2016-05-26: qty 1

## 2016-05-26 MED ORDER — VITAMIN B-12 1000 MCG PO TABS
1000.0000 ug | ORAL_TABLET | Freq: Every day | ORAL | Status: DC
  Administered 2016-05-27: 1000 ug via ORAL
  Filled 2016-05-26: qty 1

## 2016-05-26 MED ORDER — HYDROCODONE-ACETAMINOPHEN 5-325 MG PO TABS: 1.0000 | ORAL_TABLET | ORAL | Status: DC | PRN

## 2016-05-26 MED ORDER — FENTANYL CITRATE (PF) 100 MCG/2ML IJ SOLN
25.0000 ug | INTRAMUSCULAR | Status: DC | PRN
  Administered 2016-05-26: 50 ug via INTRAVENOUS

## 2016-05-26 MED ORDER — METOPROLOL TARTRATE 50 MG PO TABS
50.0000 mg | ORAL_TABLET | Freq: Two times a day (BID) | ORAL | Status: DC
  Administered 2016-05-26 – 2016-05-27 (×3): 50 mg via ORAL
  Filled 2016-05-26 (×3): qty 1

## 2016-05-26 MED ORDER — ONDANSETRON HCL 4 MG/2ML IJ SOLN
INTRAMUSCULAR | Status: DC | PRN
  Administered 2016-05-26: 4 mg via INTRAVENOUS

## 2016-05-26 MED ORDER — PIOGLITAZONE HCL 30 MG PO TABS
30.0000 mg | ORAL_TABLET | Freq: Every day | ORAL | Status: DC
  Administered 2016-05-27: 30 mg via ORAL
  Filled 2016-05-26 (×2): qty 1

## 2016-05-26 MED ORDER — MEPERIDINE HCL 25 MG/ML IJ SOLN: 6.2500 mg | INTRAMUSCULAR | Status: DC | PRN

## 2016-05-26 MED ORDER — METOCLOPRAMIDE HCL 5 MG PO TABS: 5.0000 mg | ORAL_TABLET | Freq: Three times a day (TID) | ORAL | Status: DC | PRN

## 2016-05-26 MED ORDER — POTASSIUM CHLORIDE CRYS ER 10 MEQ PO TBCR
40.0000 meq | EXTENDED_RELEASE_TABLET | Freq: Once | ORAL | Status: AC
  Administered 2016-05-27: 40 meq via ORAL
  Filled 2016-05-26: qty 4

## 2016-05-26 MED ORDER — METOCLOPRAMIDE HCL 5 MG/ML IJ SOLN: 5.0000 mg | Freq: Three times a day (TID) | INTRAMUSCULAR | Status: DC | PRN

## 2016-05-26 MED ORDER — ACETAMINOPHEN 325 MG PO TABS: 650.0000 mg | ORAL_TABLET | Freq: Four times a day (QID) | ORAL | Status: DC | PRN

## 2016-05-26 MED ORDER — FENTANYL CITRATE (PF) 250 MCG/5ML IJ SOLN
INTRAMUSCULAR | Status: AC
  Filled 2016-05-26: qty 5

## 2016-05-26 MED ORDER — FENTANYL CITRATE (PF) 100 MCG/2ML IJ SOLN
INTRAMUSCULAR | Status: AC
  Administered 2016-05-26: 50 ug via INTRAVENOUS
  Filled 2016-05-26: qty 2

## 2016-05-26 MED ORDER — ONDANSETRON HCL 4 MG PO TABS: 4.0000 mg | ORAL_TABLET | Freq: Four times a day (QID) | ORAL | Status: DC | PRN

## 2016-05-26 MED ORDER — ESMOLOL HCL 100 MG/10ML IV SOLN
INTRAVENOUS | Status: DC | PRN
  Administered 2016-05-26: 10 mg via INTRAVENOUS

## 2016-05-26 MED ORDER — ACETAMINOPHEN 650 MG RE SUPP: 650.0000 mg | Freq: Four times a day (QID) | RECTAL | Status: DC | PRN

## 2016-05-26 MED ORDER — POTASSIUM CHLORIDE IN NACL 40-0.9 MEQ/L-% IV SOLN
INTRAVENOUS | Status: DC
  Administered 2016-05-26: 50 mL/h via INTRAVENOUS
  Filled 2016-05-26 (×2): qty 1000

## 2016-05-26 MED ORDER — CHLORHEXIDINE GLUCONATE 4 % EX LIQD
60.0000 mL | Freq: Once | CUTANEOUS | Status: AC
  Administered 2016-05-26: 4 via TOPICAL
  Filled 2016-05-26: qty 60

## 2016-05-26 MED ORDER — POTASSIUM CHLORIDE ER 10 MEQ PO TBCR
40.0000 meq | EXTENDED_RELEASE_TABLET | Freq: Once | ORAL | Status: DC
  Filled 2016-05-26: qty 4

## 2016-05-26 MED ORDER — ONDANSETRON 4 MG PO TBDP
4.0000 mg | ORAL_TABLET | Freq: Three times a day (TID) | ORAL | Status: DC | PRN
  Filled 2016-05-26: qty 1

## 2016-05-26 MED ORDER — 0.9 % SODIUM CHLORIDE (POUR BTL) OPTIME
TOPICAL | Status: DC | PRN
  Administered 2016-05-26 (×2): 1000 mL

## 2016-05-26 MED ORDER — METOPROLOL TARTRATE 5 MG/5ML IV SOLN
5.0000 mg | Freq: Once | INTRAVENOUS | Status: AC
  Administered 2016-05-26: 5 mg via INTRAVENOUS

## 2016-05-26 MED ORDER — RIVAROXABAN 10 MG PO TABS
10.0000 mg | ORAL_TABLET | Freq: Every day | ORAL | Status: DC
  Administered 2016-05-27: 10 mg via ORAL
  Filled 2016-05-26: qty 1

## 2016-05-26 MED ORDER — ASPIRIN EC 81 MG PO TBEC: 81.0000 mg | DELAYED_RELEASE_TABLET | Freq: Every day | ORAL | Status: DC

## 2016-05-26 MED ORDER — ALBUMIN HUMAN 5 % IV SOLN
INTRAVENOUS | Status: DC | PRN
  Administered 2016-05-26: 18:00:00 via INTRAVENOUS

## 2016-05-26 MED ORDER — NITROGLYCERIN 0.4 MG SL SUBL: 0.4000 mg | SUBLINGUAL_TABLET | SUBLINGUAL | Status: DC | PRN

## 2016-05-26 MED ORDER — PROMETHAZINE HCL 25 MG/ML IJ SOLN
6.2500 mg | INTRAMUSCULAR | Status: DC | PRN
  Administered 2016-05-26: 6.25 mg via INTRAVENOUS

## 2016-05-26 MED ORDER — ROCURONIUM BROMIDE 50 MG/5ML IV SOLN
INTRAVENOUS | Status: AC
  Filled 2016-05-26: qty 1

## 2016-05-26 MED ORDER — INSULIN GLARGINE 100 UNIT/ML ~~LOC~~ SOLN
10.0000 [IU] | Freq: Every day | SUBCUTANEOUS | Status: DC
  Administered 2016-05-27: 10 [IU] via SUBCUTANEOUS
  Filled 2016-05-26 (×2): qty 0.1

## 2016-05-26 SURGICAL SUPPLY — 71 items
BANDAGE ELASTIC 4 VELCRO ST LF (GAUZE/BANDAGES/DRESSINGS) ×3 IMPLANT
BANDAGE ELASTIC 6 VELCRO ST LF (GAUZE/BANDAGES/DRESSINGS) IMPLANT
BANDAGE ESMARK 6X9 LF (GAUZE/BANDAGES/DRESSINGS) ×1 IMPLANT
BIT DRILL LONG 4.0 (BIT) ×1 IMPLANT
BIT DRILL SHORT 4.0 (BIT) ×1 IMPLANT
BLADE SURG 15 STRL LF DISP TIS (BLADE) ×1 IMPLANT
BLADE SURG 15 STRL SS (BLADE) ×2
BLADE SURG ROTATE 9660 (MISCELLANEOUS) IMPLANT
BNDG COHESIVE 6X5 TAN STRL LF (GAUZE/BANDAGES/DRESSINGS) ×3 IMPLANT
BNDG ELASTIC 6X10 VLCR STRL LF (GAUZE/BANDAGES/DRESSINGS) ×3 IMPLANT
BNDG ESMARK 6X9 LF (GAUZE/BANDAGES/DRESSINGS) ×3
BNDG GAUZE ELAST 4 BULKY (GAUZE/BANDAGES/DRESSINGS) IMPLANT
CANISTER SUCTION WELLS/JOHNSON (MISCELLANEOUS) ×3 IMPLANT
COVER SURGICAL LIGHT HANDLE (MISCELLANEOUS) ×3 IMPLANT
CUFF TOURNIQUET SINGLE 34IN LL (TOURNIQUET CUFF) ×3 IMPLANT
CUFF TOURNIQUET SINGLE 44IN (TOURNIQUET CUFF) IMPLANT
DRAPE C-ARM 42X72 X-RAY (DRAPES) ×3 IMPLANT
DRAPE C-ARMOR (DRAPES) ×3 IMPLANT
DRAPE EXTREMITY T 121X128X90 (DRAPE) ×3 IMPLANT
DRAPE IMP U-DRAPE 54X76 (DRAPES) ×3 IMPLANT
DRAPE INCISE IOBAN 66X45 STRL (DRAPES) ×6 IMPLANT
DRAPE ORTHO SPLIT 77X108 STRL (DRAPES) ×2
DRAPE PROXIMA HALF (DRAPES) ×3 IMPLANT
DRAPE SURG ORHT 6 SPLT 77X108 (DRAPES) ×1 IMPLANT
DRAPE U-SHAPE 47X51 STRL (DRAPES) ×3 IMPLANT
DRILL BIT LONG 4.0 (BIT) ×3
DRILL BIT SHORT 4.0 (BIT) ×2
DRSG AQUACEL AG ADV 3.5X 4 (GAUZE/BANDAGES/DRESSINGS) ×12 IMPLANT
DRSG AQUACEL AG ADV 3.5X 6 (GAUZE/BANDAGES/DRESSINGS) ×3 IMPLANT
DURAPREP 26ML APPLICATOR (WOUND CARE) ×3 IMPLANT
ELECT REM PT RETURN 9FT ADLT (ELECTROSURGICAL) ×3
ELECTRODE REM PT RTRN 9FT ADLT (ELECTROSURGICAL) ×1 IMPLANT
FACESHIELD WRAPAROUND (MASK) IMPLANT
GAUZE SPONGE 4X4 12PLY STRL (GAUZE/BANDAGES/DRESSINGS) IMPLANT
GAUZE XEROFORM 5X9 LF (GAUZE/BANDAGES/DRESSINGS) ×3 IMPLANT
GLOVE BIOGEL PI IND STRL 8 (GLOVE) ×1 IMPLANT
GLOVE BIOGEL PI INDICATOR 8 (GLOVE) ×2
GLOVE SURG ORTHO 8.0 STRL STRW (GLOVE) ×6 IMPLANT
GOWN STRL REUS W/ TWL LRG LVL3 (GOWN DISPOSABLE) ×2 IMPLANT
GOWN STRL REUS W/ TWL XL LVL3 (GOWN DISPOSABLE) ×2 IMPLANT
GOWN STRL REUS W/TWL LRG LVL3 (GOWN DISPOSABLE) ×4
GOWN STRL REUS W/TWL XL LVL3 (GOWN DISPOSABLE) ×4
GUIDE PIN 3.2X343 (PIN) ×1
GUIDE PIN 3.2X343MM (PIN) ×2
GUIDE ROD 3.0 (MISCELLANEOUS) ×3
KIT BASIN OR (CUSTOM PROCEDURE TRAY) ×3 IMPLANT
KIT ROOM TURNOVER OR (KITS) ×3 IMPLANT
MANIFOLD NEPTUNE II (INSTRUMENTS) IMPLANT
NAIL CAP 15MM (Nail) ×3 IMPLANT
NS IRRIG 1000ML POUR BTL (IV SOLUTION) ×3 IMPLANT
PACK GENERAL/GYN (CUSTOM PROCEDURE TRAY) ×3 IMPLANT
PACK UNIVERSAL I (CUSTOM PROCEDURE TRAY) ×3 IMPLANT
PAD ARMBOARD 7.5X6 YLW CONV (MISCELLANEOUS) ×6 IMPLANT
PADDING CAST COTTON 6X4 STRL (CAST SUPPLIES) ×6 IMPLANT
PIN GUIDE 3.2X343MM (PIN) ×1 IMPLANT
ROD GUIDE 3.0 (MISCELLANEOUS) ×1 IMPLANT
SCREW TRIGEN LOW PROF 5.0X32.5 (Screw) ×3 IMPLANT
SCREW TRIGEN LOW PROF 5.0X35 (Screw) ×3 IMPLANT
SCREW TRIGEN LOW PROF 5.0X45 (Screw) ×3 IMPLANT
SCREW TRIGEN LOW PROF 5.0X47.5 (Screw) ×3 IMPLANT
SPONGE LAP 18X18 X RAY DECT (DISPOSABLE) ×3 IMPLANT
STAPLER VISISTAT 35W (STAPLE) ×3 IMPLANT
STOCKINETTE IMPERVIOUS LG (DRAPES) ×3 IMPLANT
SUT ETHILON 3 0 PS 1 (SUTURE) ×15 IMPLANT
SUT VIC AB 0 CT1 27 (SUTURE) ×4
SUT VIC AB 0 CT1 27XBRD ANBCTR (SUTURE) ×2 IMPLANT
SUT VIC AB 2-0 CT1 27 (SUTURE) ×4
SUT VIC AB 2-0 CT1 TAPERPNT 27 (SUTURE) ×2 IMPLANT
TIBIAL NAIL 11.5X33MM (Orthopedic Implant) ×3 IMPLANT
TOWEL OR 17X24 6PK STRL BLUE (TOWEL DISPOSABLE) ×3 IMPLANT
TOWEL OR 17X26 10 PK STRL BLUE (TOWEL DISPOSABLE) ×3 IMPLANT

## 2016-05-26 NOTE — Anesthesia Preprocedure Evaluation (Addendum)
Anesthesia Evaluation  Patient identified by MRN, date of birth, ID band Patient awake    Reviewed: Allergy & Precautions, NPO status , Patient's Chart, lab work & pertinent test results, reviewed documented beta blocker date and time   History of Anesthesia Complications (+) history of anesthetic complications  Airway Mallampati: II  TM Distance: >3 FB Neck ROM: Full    Dental  (+) Partial Lower, Partial Upper   Pulmonary neg pulmonary ROS,    breath sounds clear to auscultation- rhonchi       Cardiovascular hypertension, Pt. on medications and Pt. on home beta blockers + CAD, + Past MI and +CHF   Rhythm:Regular     Neuro/Psych PSYCHIATRIC DISORDERS Anxiety T12 compression fracture    GI/Hepatic Neg liver ROS, PUD,   Endo/Other  diabetes, Type 2  Renal/GU negative Renal ROS     Musculoskeletal  (+) Arthritis ,   Abdominal   Peds  Hematology negative hematology ROS (+)   Anesthesia Other Findings   Reproductive/Obstetrics                            Anesthesia Physical  Anesthesia Plan  ASA: III  Anesthesia Plan: General   Post-op Pain Management:    Induction: Intravenous  Airway Management Planned: Oral ETT  Additional Equipment:   Intra-op Plan:   Post-operative Plan:   Informed Consent: I have reviewed the patients History and Physical, chart, labs and discussed the procedure including the risks, benefits and alternatives for the proposed anesthesia with the patient or authorized representative who has indicated his/her understanding and acceptance.   Dental advisory given  Plan Discussed with: CRNA and Surgeon  Anesthesia Plan Comments:       Anesthesia Quick Evaluation

## 2016-05-26 NOTE — Brief Op Note (Signed)
05/25/2016 - 05/26/2016  7:23 PM  PATIENT:  [name]  70 y.o. female  PRE-OPERATIVE DIAGNOSIS:  right tibia fibula fracture   POST-OPERATIVE DIAGNOSIS:  right tibia fibula fracture   PROCEDURE:  Procedure(s): INTRAMEDULLARY (IM) NAIL TIBIAL  SURGEON:  Surgeon(s): Meredith Pel, MD  ASSISTANT: Laure Kidney RNFA  ANESTHESIA:   general  EBL: 75 ml       BLOOD ADMINISTERED: none  DRAINS: none   LOCAL MEDICATIONS USED:  none  SPECIMEN:  No Specimen  COUNTS:  YES  TOURNIQUET:    DICTATION: .Other Dictation: Dictation Number done  PLAN OF CARE: Admit to inpatient   PATIENT DISPOSITION:  PACU - hemodynamically stable

## 2016-05-26 NOTE — Progress Notes (Addendum)
Pt stable For im nail tibia today compts ok right leg Ct leg distal tibia ok as far as intraarticular extension goes

## 2016-05-26 NOTE — Care Management Important Message (Signed)
Important Message  Patient Details  Name: [name] MRN: [id number] Date of Birth: 1946/03/28   Medicare Important Message Given:  Yes    [name] 05/26/2016, 9:58 AM

## 2016-05-26 NOTE — Anesthesia Procedure Notes (Signed)
Procedure Name: Intubation Date/Time: 05/26/2016 5:03 PM Performed by: Ollen Bowl Pre-anesthesia Checklist: Patient identified, Emergency Drugs available, Suction available, Patient being monitored and Timeout performed Patient Re-evaluated:Patient Re-evaluated prior to inductionOxygen Delivery Method: Circle system utilized and Simple face mask Preoxygenation: Pre-oxygenation with 100% oxygen Intubation Type: IV induction Ventilation: Mask ventilation without difficulty Laryngoscope Size: Miller and 2 Grade View: Grade I Tube type: Oral Tube size: 7.0 mm Number of attempts: 1 Airway Equipment and Method: Patient positioned with wedge pillow and Stylet Placement Confirmation: ETT inserted through vocal cords under direct vision,  positive ETCO2 and breath sounds checked- equal and bilateral Secured at: 21 cm Tube secured with: Tape Dental Injury: Teeth and Oropharynx as per pre-operative assessment

## 2016-05-26 NOTE — Consult Note (Addendum)
Cardiology Consult    Patient ID: [name] MRN: [id number], DOB/AGE: October 04, 1946  Admit date: 05/25/2016 Date of Consult: 05/27/2016 Primary Physician: Shirline Frees, MD Primary Cardiologist:  Minus Breeding Requesting Provider:  Albertha Ghee, MD (Anesthesia)  Reason for consult:  SVT  Patient Profile    70 yo F w a h/o CAD s/p DES x 2 to RCA (2014), SVT, DM, HTN, HLD, & Anxiety was admitted on 05/25/16 after a mechanical fall, complaining of right leg pain, found to have a R distal tib-fib fracture.  She underwent intramedullary nailing today with her OR time starting ~ 5 pm.  After extubation in the PACU, she was observed to have multiple non-sustained episodes of SVT treated with a single dose of Metoprolol IV 5 mg.  Her HR was reported to be up to the 140's with a stable BP.  Tht pt was asymptomatic but groggy from surgery.  Since arrival back to the floor at 9:45 pm, she has had no recurrent arrhythmia.  However, review of her telemetry from prior to surgery revealed multiple episodes of SVT.  That being said, she has missed her evening dose of Metoprolol on 05/25/16 & her morning dose on 05/26/16.  Notably, during an admission 08/2015 for kyphoplasty, the pt had SVT x 10 minutes during the extubation process, which terminated with 1 dose of Adenosine & 1 dose of Lopressor 5 mg IV.  The pt denied symptoms with her recent SVT.  She noted very rare palpitations - maybe ~ monthly, lasting seconds at a time without association symptoms.  Otherwise, she denied chest pain, palpitations, lightheadedness, dyspnea, orthopnea, or paroxysmal nocturnal dyspnea.  She has chronic, intermittent lower extremity swelling.    Data - Telemetry strip from PACU - 12 second run of narrow-complex, short RP SVT, rate 140's - Telemetry review from unit - Numerous runs of SVT - EKG:  NSR with frequent PAC's, nonspecific ST-T abnormalities, QTc 505 - TTE (06/2015):  EF 55-60%, mild LVH, grade I diastolic  dysfunction, RA mildly dilated  Past Medical History   Past Medical History  Diagnosis Date  . Hypertension   . Hypercholesteremia   . CAD (coronary artery disease)     a. NSTEMI in setting of UTI and SVT 12/2012 => LHC 01/08/13: Proximal LAD 30%, ostial diagonal 30-40%, proximal RCA 95%, inferior AK, EF 45-50% => PCI: Promus DES x 2 to RCA;  b. Echo 01/05/13: EF [id number], grade 1 diastolic dysfunction, MAC   . SVT (supraventricular tachycardia) ([place])   . Rectal ulceration     colonoscopy 12/2012  . Anxiety     Due to current back pain and has to lie flat.  . Arthritis   . History of blood transfusion   . Myocardial infarction ([place]) 2014  . Type II diabetes mellitus ([place])   . Compression fracture     thoracic 12  . Edema     Past Surgical History  Procedure Laterality Date  . Eye surgery Bilateral "many"  . Tubal ligation    . Kyphoplasty  12/27/2012    Procedure: KYPHOPLASTY;  Surgeon: Sinclair Ship, MD;  Location: [facility];  Service: Orthopedics;  Laterality: Bilateral;  T-8 kyphoplasty  . Colonoscopy Left 01/12/2013    Procedure: COLONOSCOPY;  Surgeon: Wonda Horner, MD;  Location: [facility] ENDOSCOPY;  Service: Endoscopy;  Laterality: Left;  . Colonoscopy N/A 01/14/2013    Procedure: COLONOSCOPY;  Surgeon: Wonda Horner, MD;  Location: [facility] ENDOSCOPY;  Service: Endoscopy;  Laterality: N/A;  Rm 2034, attemped Colon on 2-15=inadequate prep  . Coronary angioplasty  12/2012  . Left heart catheterization with coronary angiogram N/A 01/08/2013    Procedure: LEFT HEART CATHETERIZATION WITH CORONARY ANGIOGRAM;  Surgeon: Thayer Headings, MD;  Location: [facility] CATH LAB;  Service: Cardiovascular;  Laterality: N/A;  . Percutaneous stent intervention N/A 01/15/2013    Procedure: PERCUTANEOUS STENT INTERVENTION;  Surgeon: Peter M Martinique, MD;  Location: [facility] CATH LAB;  Service: Cardiovascular;  Laterality: N/A;  . Orif hip fracture Left 11/15/2014    Procedure: OPEN REDUCTION INTERNAL FIXATION HIP INTERTROCH;   Surgeon: Mcarthur Rossetti, MD;  Location: [place];  Service: Orthopedics;  Laterality: Left;  Marland Kitchen Kyphoplasty N/A 07/15/2015    Procedure: T11 KYPHOPLASTY;  Surgeon: Phylliss Bob, MD;  Location: [place];  Service: Orthopedics;  Laterality: N/A;  T11 kyphoplasty  . Cataract extraction, bilateral Bilateral   . Kyphoplasty N/A 09/17/2015    Procedure: T12 kyphoplasty;  Surgeon: Phylliss Bob, MD;  Location: [place];  Service: Orthopedics;  Laterality: N/A;  T12 kyphoplasty  . Fracture surgery      Allergies  Allergies  Allergen Reactions  . Oxycodone Nausea Only   Inpatient Medications    . furosemide  40 mg Oral Daily  . insulin glargine  10 Units Subcutaneous QHS  . metFORMIN  1,000 mg Oral BID WC  . metoprolol  50 mg Oral BID  . pioglitazone  30 mg Oral Daily  . potassium chloride  40 mEq Oral Once  . ramipril  10 mg Oral Daily  . rivaroxaban  10 mg Oral Daily  . simvastatin  40 mg Oral QHS  . vitamin B-12  1,000 mcg Oral Daily   Family History    Family History  Problem Relation Age of Onset  . Hypertension Brother    Social History    Social History   Social History  . Marital Status: Widowed    Spouse Name: N/A  . Number of Children: N/A  . Years of Education: N/A   Occupational History  . Not on file.   Social History Main Topics  . Smoking status: Never Smoker   . Smokeless tobacco: Never Used  . Alcohol Use: No  . Drug Use: No  . Sexual Activity: Yes   Other Topics Concern  . Not on file   Social History Narrative    Review of Systems    General:  No chills, fever, night sweats or weight changes.  Cardiovascular:  No chest pain, dyspnea on exertion, edema, orthopnea, palpitations, paroxysmal nocturnal dyspnea. Dermatological: No rash, lesions/masses Respiratory: No cough, dyspnea Urologic: No hematuria, dysuria Abdominal:   No nausea, vomiting, diarrhea, bright red blood per rectum, melena, or hematemesis Neurologic:  No visual changes, wkns,  changes in mental status. All other systems reviewed and are otherwise negative except as noted above.  Physical Exam    Blood pressure 125/49, pulse 69, temperature 98.1 F (36.7 C), temperature source Oral, resp. rate 16, height 5\' 8"  (1.727 m), weight 88.86 kg (195 lb 14.4 oz), SpO2 99 %.  General: Pleasant, NAD Psych: Normal affect. Neuro: Alert and oriented X 3. Moves all extremities spontaneously. HEENT: Normal  Neck: Supple without bruits or JVD. Lungs:  Resp regular and unlabored, CTA. Heart: RRR no s3, s4, or murmurs. Abdomen: Soft, non-tender, non-distended, BS + x 4.  Extremities:  RLE wrapped.  Trace PE b/l LE.    Labs    Troponin (Point of Care Test) No results for input(s): TROPIPOC  in the last 72 hours.  Recent Labs  05/25/16 2006  CKTOTAL 52   Lab Results  Component Value Date   WBC 8.8 05/25/2016   HGB 14.3 05/25/2016   HCT 42.0 05/25/2016   MCV 94.7 05/25/2016   PLT 182 05/25/2016    Recent Labs Lab 05/25/16 2006 05/25/16 2014  NA 137 140  K 3.2* 3.2*  CL 101 101  CO2 26  --   BUN 17 18  CREATININE 0.81 0.70  CALCIUM 8.4*  --   GLUCOSE 157* 152*   No results found for: CHOL, HDL, LDLCALC, TRIG No results found for: [facility]   Radiology Studies    Dg Chest 1 View  05/25/2016  CLINICAL DATA:  Fall at home this evening. EXAM: CHEST 1 VIEW COMPARISON:  09/17/2015 FINDINGS: The lungs are clear wiithout focal pneumonia, edema, pneumothorax or pleural effusion. The cardio pericardial silhouette is enlarged. Patient is status post multilevel thoracic vertebral augmentation. IMPRESSION: No acute cardiopulmonary findings. Electronically Signed   By: Misty Stanley M.D.   On: 05/25/2016 21:05   Dg Tibia/fibula Right  05/26/2016  CLINICAL DATA:  Right tibial ORIF EXAM: DG C-ARM 61-120 MIN; RIGHT TIBIA AND FIBULA - 2 VIEW COMPARISON:  None FLUOROSCOPY TIME:  1 minutes 44 seconds FINDINGS: Comminuted distal tibial metaphysis fracture transfixed with an intra  medullary nail and interlocking screws in near anatomic alignment. Mildly comminuted and displaced oblique fracture of the proximal fibular diaphysis. IMPRESSION: ORIF right distal tibial fracture. Mildly comminuted and displaced oblique fracture of the proximal fibular diaphysis. Electronically Signed   By: Kathreen Devoid   On: 05/26/2016 19:45   Dg Tibia/fibula Right  05/25/2016  CLINICAL DATA:  [place] at home this evening.  Initial encounter. EXAM: RIGHT TIBIA AND FIBULA - 2 VIEW COMPARISON:  None. FINDINGS: Comminuted fracture proximal fibula is assisted with a comminuted fracture the distal tibia. Bones are demineralized. IMPRESSION: Comminuted fractures of the proximal fibula and distal tibia. Electronically Signed   By: Misty Stanley M.D.   On: 05/25/2016 21:02   Dg Ankle 2 Views Right  05/25/2016  CLINICAL DATA:  Fall at home.  Initial encounter. EXAM: RIGHT ANKLE - 2 VIEW COMPARISON:  None. FINDINGS: Two-view exam of the right ankle shows a comminuted distal tibia fracture without obvious extension into the ankle mortise. Mild apex medial angulation evident. Bones are diffusely demineralized. IMPRESSION: Comminuted slightly angulated fracture the distal tibia. Electronically Signed   By: Misty Stanley M.D.   On: 05/25/2016 21:03   Ct Head Wo Contrast  05/26/2016  CLINICAL DATA:  70 year old female with fall EXAM: CT HEAD WITHOUT CONTRAST TECHNIQUE: Contiguous axial images were obtained from the base of the skull through the vertex without intravenous contrast. COMPARISON:  Head CT dated 07/18/2015 FINDINGS: There is mild age-related atrophy and chronic microvascular ischemic changes. No acute intracranial hemorrhage. No mass effect or midline shift. The visualized paranasal sinuses and mastoid air cells are clear. The calvarium is intact. IMPRESSION: No acute intracranial hemorrhage. Electronically Signed   By: Anner Crete M.D.   On: 05/26/2016 00:07   Ct Tibia Fibula Right Wo  Contrast  05/26/2016  CLINICAL DATA:  70 year old female with fall and right lower extremity pain. EXAM: CT OF THE RIGHT TIBIA FIBULA WITHOUT CONTRAST TECHNIQUE: Multidetector CT imaging was performed according to the standard protocol. Multiplanar CT image reconstructions were also generated. COMPARISON:  Radiograph dated 05/25/2016 FINDINGS: There is a comminuted fracture of the proximal fibula with approximately 6 mm lateral  displacement of the distal fracture fragment. There is also a comminuted nondisplaced fracture of the distal tibia. There is advanced osteopenia which limits evaluation for fracture. A hairline linear lucency through the tibial plafond (series 2, image 159) likely represents a vascular groove or related to osteopenia. Intra-articular extension of fracture is less likely. There is however irregularity of the lateral aspect of the distal tibia at the anterior tibiofibular syndesmosis (series 2, image 160 and coronal series 4, image 41- 45). Small bone fragment noted adjacent to the anterior corner of the distal tibiotalar syndesmosis. This may represent a fracture or degenerative changes. There is no dislocation. There is no joint effusion. There is mild soft tissue edema in the distal calf. No large hematoma. IMPRESSION: Comminuted fractures of the proximal fibula and distal tibia. Irregularity of the anterior aspect of the distal tibiofibular syndesmosis with small adjacent bone fragment. This may be chronic or represent acute fracture. Clinical correlation is recommended. No dislocation. Advanced osteopenia. Electronically Signed   By: Anner Crete M.D.   On: 05/26/2016 00:50   Dg C-arm 61-120 Min  05/26/2016  CLINICAL DATA:  Right tibial ORIF EXAM: DG C-ARM 61-120 MIN; RIGHT TIBIA AND FIBULA - 2 VIEW COMPARISON:  None FLUOROSCOPY TIME:  1 minutes 44 seconds FINDINGS: Comminuted distal tibial metaphysis fracture transfixed with an intra medullary nail and interlocking screws in near  anatomic alignment. Mildly comminuted and displaced oblique fracture of the proximal fibular diaphysis. IMPRESSION: ORIF right distal tibial fracture. Mildly comminuted and displaced oblique fracture of the proximal fibular diaphysis. Electronically Signed   By: Kathreen Devoid   On: 05/26/2016 19:45   Assessment & Plan    70 yo F w a h/o CAD s/p DES x 2 to RCA (2014), SVT, DM, HTN, HLD, & Anxiety p/w a R distal tib-fib fracture now s/p intramedullary nailing, post-operative course notable for SVT in the setting of being off-schedule with her Metoprolol, pain, & anesthesia.   # SVT - I am hesitant to up-titrate her Metoprolol given her resting HR in the 60's, particularly given her lack of symptoms. - Monitor on telemetry for now with Metoprolol re-implemented on BID schedule.   - We have checked a TSH & Mg. - We have repleted her potassium.  - Will recheck an EKG tomorrow morning once her potassium had been repleted to f/u her prolonged QTc from prior.    # h/o CAD - She does not ave angina or evidence of ischemia on her ECG - We would like to see her ASA resumed as soon as possible.  Otherwise, we agree with her Metoprolol & Simvastatin.    # h/o Diastolic dysfunction - She is relatively euvolemic on physical exam. - We agree with her current Furosemide 40 mg PO daily.    # h/o HTN - She is normotensive to hypertensive, likely related to pain.   - We agree with her Metoprolol 50 mg PO BID & Ramipril 10 mg daily.    # h/o HLD - We agree with her Simvastatin 40 mg QHS.    Signed, Alfonso Ramus, MD 05/27/2016, 12:39 AM

## 2016-05-26 NOTE — Transfer of Care (Signed)
Immediate Anesthesia Transfer of Care Note  Patient: Jacqulyn Liner  Procedure(s) Performed: Procedure(s): INTRAMEDULLARY (IM) NAIL TIBIAL (Right)  Patient Location: PACU  Anesthesia Type:General  Level of Consciousness: awake and alert   Airway & Oxygen Therapy: Patient Spontanous Breathing and Patient connected to nasal cannula oxygen  Post-op Assessment: Report given to RN, Post -op Vital signs reviewed and stable, Patient moving all extremities and Continues to go in and out of SVT, BP and breathing stable.   Post vital signs: Reviewed and stable  Last Vitals:  Filed Vitals:   05/26/16 1312 05/26/16 1602  BP: 141/64 173/96  Pulse: 82 99  Temp: 36.7 C   Resp: 20     Last Pain:  Filed Vitals:   05/26/16 1604  PainSc: 0-No pain         Complications: No apparent anesthesia complications

## 2016-05-27 ENCOUNTER — Encounter (HOSPITAL_COMMUNITY): Payer: Self-pay | Admitting: Orthopedic Surgery

## 2016-05-27 LAB — MAGNESIUM: Magnesium: 2 mg/dL (ref 1.7–2.4)

## 2016-05-27 LAB — BASIC METABOLIC PANEL
Anion gap: 6 (ref 5–15)
BUN: 19 mg/dL (ref 6–20)
CO2: 25 mmol/L (ref 22–32)
Calcium: 7.9 mg/dL — ABNORMAL LOW (ref 8.9–10.3)
Chloride: 107 mmol/L (ref 101–111)
Creatinine, Ser: 0.69 mg/dL (ref 0.44–1.00)
GFR calc Af Amer: >60 mL/min (ref >60)
GFR calc non Af Amer: >60 mL/min (ref >60)
Glucose, Bld: 194 mg/dL — ABNORMAL HIGH (ref 65–99)
Potassium: 4.4 mmol/L (ref 3.5–5.1)
Sodium: 138 mmol/L (ref 135–145)

## 2016-05-27 LAB — GLUCOSE, CAPILLARY
GLUCOSE-CAPILLARY: 132 mg/dL — AB (ref 65–99)
GLUCOSE-CAPILLARY: 164 mg/dL — AB (ref 65–99)

## 2016-05-27 LAB — TSH: TSH: 0.862 u[IU]/mL (ref 0.350–4.500)

## 2016-05-27 MED ORDER — HYDROCODONE-ACETAMINOPHEN 5-325 MG PO TABS: 1.0000 | ORAL_TABLET | ORAL | Status: DC | PRN

## 2016-05-27 MED ORDER — RIVAROXABAN 10 MG PO TABS: 10.0000 mg | ORAL_TABLET | Freq: Every day | ORAL | Status: DC

## 2016-05-27 NOTE — Clinical Social Work Placement (Signed)
   CLINICAL SOCIAL WORK PLACEMENT  NOTE  Date:  05/27/2016  Patient Details  Name: [name] MRN: [id number] Date of Birth: 1946-07-21  Clinical Social Work is seeking post-discharge placement for this patient at the [place] level of care (*CSW will initial, date and re-position this form in  chart as items are completed):  Yes   Patient/family provided with [facility] Work Department's list of facilities offering this level of care within the geographic area requested by the patient (or if unable, by the patient's family).  Yes   Patient/family informed of their freedom to choose among providers that offer the needed level of care, that participate in Medicare, Medicaid or managed care program needed by the patient, have an available bed and are willing to accept the patient.  Yes   Patient/family informed of [facility]'s ownership interest in [facility] and [facility], as well as of the fact that they are under no obligation to receive care at these facilities.  PASRR submitted to EDS on       PASRR number received on       Existing PASRR number confirmed on 05/27/16     FL2 transmitted to all facilities in geographic area requested by pt/family on 05/27/16     FL2 transmitted to all facilities within larger geographic area on       Patient informed that his/her managed care company has contracts with or will negotiate with certain facilities, including the following:        Yes   Patient/family informed of bed offers received.  Patient chooses bed at [facility] recommends and patient chooses bed at      Patient to be transferred to [facility] on 05/27/16.  Patient to be transferred to facility by [place] (friend)     Patient family notified on 05/27/16 of transfer.  Name of family member notified:  Friend     PHYSICIAN       Additional Comment:     _______________________________________________ Benard Halsted, [place] 05/27/2016, 1:06 PM

## 2016-05-27 NOTE — Clinical Social Work Note (Signed)
Clinical Social Work Assessment  Patient Details  Name: [name] MRN: [id number] Date of Birth: 10/12/1946  Date of referral:  05/27/16               Reason for consult:  Facility Placement                Permission sought to share information with:  Facility Sport and exercise psychologist, Family Supports Permission granted to share information::  Yes, Verbal Permission Granted  Name::        Agency::  [facility] SNFs  Relationship::     Contact Information:     Housing/Transportation Living arrangements for the past 2 months:  [place] of Information:  Patient Patient Interpreter Needed:  None Criminal Activity/Legal Involvement Pertinent to Current Situation/Hospitalization:  No - Comment as needed Significant Relationships:  Friend Lives with:  Self Do you feel safe going back to the place where you live?  No Need for family participation in patient care:  No (Coment)  Care giving concerns:  CSW received referral for possible SNF placement at time of discharge. CSW met with patient at bedside regarding PT recommendation of SNF placement at time of discharge. Patient lives alone and is currently unable to care for patient at home given patient's current physical needs and fall risk. Patient expressed understanding of PT recommendation and are agreeable to SNF placement at time of discharge. CSW to continue to follow and assist with discharge planning needs.   Social Worker assessment / plan:  CSW spoke with patient concerning possibility of rehab at [facility] before returning home.  Employment status:  Retired Nurse, adult PT Recommendations:  Not assessed at this time [place] / Referral to community resources:  [place]  Patient/Family's Response to care:  Patient recognizes need for rehab before returning home and is agreeable to a SNF in [place]. Patient reported preference for [facility] or  [facility].  Patient/Family's Understanding of and Emotional Response to Diagnosis, Current Treatment, and Prognosis:  Patient is realistic regarding therapy needs. No questions/concerns about plan or treatment.    Emotional Assessment Appearance:  Appears stated age Attitude/Demeanor/Rapport:  Other (Appropriate) Affect (typically observed):  Accepting, Appropriate Orientation:  Oriented to Self, Oriented to Place, Oriented to  Time, Oriented to Situation Alcohol / Substance use:  Not Applicable Psych involvement (Current and /or in the community):  No (Comment)  Discharge Needs  Concerns to be addressed:  Care Coordination Readmission within the last 30 days:  No Current discharge risk:  None Barriers to Discharge:  Continued Medical Work up   [employer], [place] 05/27/2016, 11:03 AM

## 2016-05-27 NOTE — Progress Notes (Signed)
Subjective: Pt stable - pain ok - no cv complaints   Objective: Vital signs in last 24 hours: Temp:  [97.5 F (36.4 C)-98.3 F (36.8 C)] 98.3 F (36.8 C) (06/30 0447) Pulse Rate:  [66-138] 66 (06/30 0447) Resp:  [14-22] 16 (06/30 0447) BP: (125-173)/(49-96) 130/55 mmHg (06/30 0447) SpO2:  [95 %-100 %] 99 % (06/30 0447)  Intake/Output from previous day: 06/29 0701 - 06/30 0700 In: 1650 [I.V.:1400; IV Piggyback:250] Out: [id number] [Urine:1325; Blood:50] Intake/Output this shift: Total I/O In: 160 [P.O.:160] Out: -   Exam:  Sensation intact distally Dorsiflexion/Plantar flexion intact  Labs:  Recent Labs  05/25/16 2006 05/25/16 2014  HGB 13.2 14.3    Recent Labs  05/25/16 2006 05/25/16 2014  WBC 8.8  --   RBC 4.34  --   HCT 41.1 42.0  PLT 182  --     Recent Labs  05/25/16 2006 05/25/16 2014  NA 137 140  K 3.2* 3.2*  CL 101 101  CO2 26  --   BUN 17 18  CREATININE 0.81 0.70  GLUCOSE 157* 152*  CALCIUM 8.4*  --    No results for input(s): LABPT, INR in the last 72 hours.  Assessment/Plan: thx for card input on svt - leg doing well - ok for dc to snf sat once cleared by cardiology   DEAN,GREGORY SCOTT 05/27/2016, 8:40 AM

## 2016-05-27 NOTE — Discharge Summary (Signed)
Physician Discharge Summary  Patient ID: [name] MRN: [id number] DOB/AGE: [birth year] 72 y.o.  Admit date: 05/25/2016 Discharge date: 05/28/2016  Admission Diagnoses:  Principal Problem:   Tibia fracture after fall-s/p intramedulaary nailing 05/26/16 Active Problems:   Hypokalemia   PSVT-post op 05/26/16   History of NSTEMI Feb 2014   CAD -S/P PCI Feb 2014   HTN (hypertension)   Type 2 diabetes mellitus without complication [facility])   Discharge Diagnoses:  Same  Surgeries: Procedure(s): INTRAMEDULLARY (IM) NAIL TIBIAL on 05/25/2016 - 05/26/2016   Consultants:    Discharged Condition: Stable  Hospital Course: [name] is an 70 y.o. female who was admitted 05/25/2016 with a chief complaint of  Chief Complaint  Patient presents with  . Fall  , and found to have a diagnosis of Tibia fracture.  They were brought to the operating room on 05/25/2016 - 05/26/2016 and underwent the above named procedures. She did well with surgery but developed svt post op. This was evaluated and treated by cardiology in the hospital with resolution.She will be nwb for 3 weeks - on xarelto for dvt prophylaxis   Antibiotics given:  Anti-infectives    Start     Dose/Rate Route Frequency Ordered Stop   05/26/16 1530  ceFAZolin (ANCEF) IVPB 2g/100 mL premix     2 g 200 mL/hr over 30 Minutes Intravenous On call to O.R. 05/26/16 0219 05/26/16 1659   05/25/16 2245  ciprofloxacin (CIPRO) tablet 500 mg     500 mg Oral 2 times daily 05/25/16 2238 05/26/16 1959    .  Recent vital signs:  Filed Vitals:   05/26/16 2300 05/27/16 0447  BP: 125/49 130/55  Pulse: 69 66  Temp: 98.1 F (36.7 C) 98.3 F (36.8 C)  Resp: 16 16    Recent laboratory studies:  Results for orders placed or performed during the hospital encounter of 05/25/16  MRSA PCR Screening  Result Value Ref Range   MRSA by PCR NEGATIVE NEGATIVE  CBC with Differential  Result Value Ref Range   WBC 8.8 4.0 - 10.5 K/uL   RBC 4.34  3.87 - 5.11 MIL/uL   Hemoglobin 13.2 12.0 - 15.0 g/dL   HCT 41.1 36.0 - 46.0 %   MCV 94.7 78.0 - 100.0 fL   MCH 30.4 26.0 - 34.0 pg   MCHC 32.1 30.0 - 36.0 g/dL   RDW 13.3 11.5 - 15.5 %   Platelets 182 150 - 400 K/uL   Neutrophils Relative % 88 %   Neutro Abs 7.7 1.7 - 7.7 K/uL   Lymphocytes Relative 6 %   Lymphs Abs 0.5 (L) 0.7 - 4.0 K/uL   Monocytes Relative 6 %   Monocytes Absolute 0.6 0.1 - 1.0 K/uL   Eosinophils Relative 0 %   Eosinophils Absolute 0.0 0.0 - 0.7 K/uL   Basophils Relative 0 %   Basophils Absolute 0.0 0.0 - 0.1 K/uL  Basic metabolic panel  Result Value Ref Range   Sodium 137 135 - 145 mmol/L   Potassium 3.2 (L) 3.5 - 5.1 mmol/L   Chloride 101 101 - 111 mmol/L   CO2 26 22 - 32 mmol/L   Glucose, Bld 157 (H) 65 - 99 mg/dL   BUN 17 6 - 20 mg/dL   Creatinine, Ser 0.81 0.44 - 1.00 mg/dL   Calcium 8.4 (L) 8.9 - 10.3 mg/dL   GFR calc non Af Amer >60 >60 mL/min   GFR calc Af Amer >60 >60 mL/min   Anion  gap 10 5 - 15  CK  Result Value Ref Range   Total CK 52 38 - 234 U/L  Urinalysis, Routine w reflex microscopic (not at [facility])  Result Value Ref Range   Color, Urine YELLOW YELLOW   APPearance CLOUDY (A) CLEAR   Specific Gravity, Urine 1.019 1.005 - 1.030   pH 8.5 (H) 5.0 - 8.0   Glucose, UA NEGATIVE NEGATIVE mg/dL   Hgb urine dipstick NEGATIVE NEGATIVE   Bilirubin Urine NEGATIVE NEGATIVE   Ketones, ur 15 (A) NEGATIVE mg/dL   Protein, ur 100 (A) NEGATIVE mg/dL   Nitrite POSITIVE (A) NEGATIVE   Leukocytes, UA MODERATE (A) NEGATIVE  Urine microscopic-add on  Result Value Ref Range   Squamous Epithelial / LPF 0-5 (A) NONE SEEN   WBC, UA 6-30 0 - 5 WBC/hpf   RBC / HPF 0-5 0 - 5 RBC/hpf   Bacteria, UA MANY (A) NONE SEEN   Crystals TRIPLE PHOSPHATE CRYSTALS (A) NEGATIVE  Glucose, capillary  Result Value Ref Range   Glucose-Capillary 85 65 - 99 mg/dL  Glucose, capillary  Result Value Ref Range   Glucose-Capillary 105 (H) 65 - 99 mg/dL  Glucose, capillary   Result Value Ref Range   Glucose-Capillary 126 (H) 65 - 99 mg/dL   Comment 1 Notify RN    Comment 2 Document in Chart   Glucose, capillary  Result Value Ref Range   Glucose-Capillary 105 (H) 65 - 99 mg/dL  TSH  Result Value Ref Range   TSH 0.862 0.350 - 4.500 uIU/mL  Magnesium  Result Value Ref Range   Magnesium 2.0 1.7 - 2.4 mg/dL  Glucose, capillary  Result Value Ref Range   Glucose-Capillary 132 (H) 65 - 99 mg/dL  I-Stat Chem 8, ED  Result Value Ref Range   Sodium 140 135 - 145 mmol/L   Potassium 3.2 (L) 3.5 - 5.1 mmol/L   Chloride 101 101 - 111 mmol/L   BUN 18 6 - 20 mg/dL   Creatinine, Ser 0.70 0.44 - 1.00 mg/dL   Glucose, Bld 152 (H) 65 - 99 mg/dL   Calcium, Ion 1.07 (L) 1.12 - 1.23 mmol/L   TCO2 25 0 - 100 mmol/L   Hemoglobin 14.3 12.0 - 15.0 g/dL   HCT 42.0 36.0 - 46.0 %  CBG monitoring, ED  Result Value Ref Range   Glucose-Capillary 131 (H) 65 - 99 mg/dL    Discharge Medications:     Medication List    STOP taking these medications        aspirin EC 81 MG tablet      TAKE these medications        docusate sodium 100 MG capsule  Commonly known as:  COLACE  Take 1 capsule (100 mg total) by mouth daily as needed for mild constipation.     furosemide 40 MG tablet  Commonly known as:  LASIX  Take 40 mg by mouth daily.     HYDROcodone-acetaminophen 5-325 MG tablet  Commonly known as:  NORCO/VICODIN  Take 1-2 tablets by mouth every 4 (four) hours as needed (breakthrough pain).     LANTUS 100 UNIT/ML injection  Generic drug:  insulin glargine  Take 10 Units by mouth daily.     metFORMIN 1000 MG tablet  Commonly known as:  GLUCOPHAGE  Take 1 tablet by mouth 2 (two) times daily.     metoprolol 50 MG tablet  Commonly known as:  LOPRESSOR  Take 1 tablet (50 mg total) by mouth 2 (  two) times daily.     nitroGLYCERIN 0.4 MG SL tablet  Commonly known as:  NITROSTAT  Place 1 tablet (0.4 mg total) under the tongue every 5 (five) minutes as needed for  chest pain.     ondansetron 4 MG disintegrating tablet  Commonly known as:  ZOFRAN ODT  Take 1 tablet (4 mg total) by mouth every 8 (eight) hours as needed for nausea or vomiting.     pioglitazone 30 MG tablet  Commonly known as:  ACTOS  Take 1 tablet by mouth daily.     ramipril 10 MG capsule  Commonly known as:  ALTACE  Take 1 capsule by mouth daily.     rivaroxaban 10 MG Tabs tablet  Commonly known as:  XARELTO  Take 1 tablet (10 mg total) by mouth daily.     simvastatin 40 MG tablet  Commonly known as:  ZOCOR  Take 40 mg by mouth at bedtime.     vitamin B-12 1000 MCG tablet  Commonly known as:  CYANOCOBALAMIN  Take 1,000 mcg by mouth daily.        Diagnostic Studies: Dg Chest 1 View  05/25/2016  CLINICAL DATA:  Fall at home this evening. EXAM: CHEST 1 VIEW COMPARISON:  09/17/2015 FINDINGS: The lungs are clear wiithout focal pneumonia, edema, pneumothorax or pleural effusion. The cardio pericardial silhouette is enlarged. Patient is status post multilevel thoracic vertebral augmentation. IMPRESSION: No acute cardiopulmonary findings. Electronically Signed   By: Misty Stanley M.D.   On: 05/25/2016 21:05   Dg Tibia/fibula Right  05/27/2016  CLINICAL DATA:  ORIF right tibia. EXAM: RIGHT TIBIA AND FIBULA - 2 VIEW COMPARISON:  May 25, 2016 FINDINGS: The proximal fibular fracture is again identified. A rod has been placed across the comminuted distal tibial fracture. Alignment is improved in the interval. Soft tissue swelling is identified. The rod is affixed proximally and distally with screws. IMPRESSION: Tibial fracture repair as above. Electronically Signed   By: Dorise Bullion III M.D   On: 05/27/2016 00:43   Dg Tibia/fibula Right  05/26/2016  CLINICAL DATA:  Right tibial ORIF EXAM: DG C-ARM 61-120 MIN; RIGHT TIBIA AND FIBULA - 2 VIEW COMPARISON:  None FLUOROSCOPY TIME:  1 minutes 44 seconds FINDINGS: Comminuted distal tibial metaphysis fracture transfixed with an intra  medullary nail and interlocking screws in near anatomic alignment. Mildly comminuted and displaced oblique fracture of the proximal fibular diaphysis. IMPRESSION: ORIF right distal tibial fracture. Mildly comminuted and displaced oblique fracture of the proximal fibular diaphysis. Electronically Signed   By: Kathreen Devoid   On: 05/26/2016 19:45   Dg Tibia/fibula Right  05/25/2016  CLINICAL DATA:  [place] at home this evening.  Initial encounter. EXAM: RIGHT TIBIA AND FIBULA - 2 VIEW COMPARISON:  None. FINDINGS: Comminuted fracture proximal fibula is assisted with a comminuted fracture the distal tibia. Bones are demineralized. IMPRESSION: Comminuted fractures of the proximal fibula and distal tibia. Electronically Signed   By: Misty Stanley M.D.   On: 05/25/2016 21:02   Dg Ankle 2 Views Right  05/25/2016  CLINICAL DATA:  Fall at home.  Initial encounter. EXAM: RIGHT ANKLE - 2 VIEW COMPARISON:  None. FINDINGS: Two-view exam of the right ankle shows a comminuted distal tibia fracture without obvious extension into the ankle mortise. Mild apex medial angulation evident. Bones are diffusely demineralized. IMPRESSION: Comminuted slightly angulated fracture the distal tibia. Electronically Signed   By: Misty Stanley M.D.   On: 05/25/2016 21:03   Ct Head Wo  Contrast  05/26/2016  CLINICAL DATA:  70 year old female with fall EXAM: CT HEAD WITHOUT CONTRAST TECHNIQUE: Contiguous axial images were obtained from the base of the skull through the vertex without intravenous contrast. COMPARISON:  Head CT dated 07/18/2015 FINDINGS: There is mild age-related atrophy and chronic microvascular ischemic changes. No acute intracranial hemorrhage. No mass effect or midline shift. The visualized paranasal sinuses and mastoid air cells are clear. The calvarium is intact. IMPRESSION: No acute intracranial hemorrhage. Electronically Signed   By: Anner Crete M.D.   On: 05/26/2016 00:07   Ct Tibia Fibula Right Wo  Contrast  05/26/2016  CLINICAL DATA:  70 year old female with fall and right lower extremity pain. EXAM: CT OF THE RIGHT TIBIA FIBULA WITHOUT CONTRAST TECHNIQUE: Multidetector CT imaging was performed according to the standard protocol. Multiplanar CT image reconstructions were also generated. COMPARISON:  Radiograph dated 05/25/2016 FINDINGS: There is a comminuted fracture of the proximal fibula with approximately 6 mm lateral displacement of the distal fracture fragment. There is also a comminuted nondisplaced fracture of the distal tibia. There is advanced osteopenia which limits evaluation for fracture. A hairline linear lucency through the tibial plafond (series 2, image 159) likely represents a vascular groove or related to osteopenia. Intra-articular extension of fracture is less likely. There is however irregularity of the lateral aspect of the distal tibia at the anterior tibiofibular syndesmosis (series 2, image 160 and coronal series 4, image 41- 45). Small bone fragment noted adjacent to the anterior corner of the distal tibiotalar syndesmosis. This may represent a fracture or degenerative changes. There is no dislocation. There is no joint effusion. There is mild soft tissue edema in the distal calf. No large hematoma. IMPRESSION: Comminuted fractures of the proximal fibula and distal tibia. Irregularity of the anterior aspect of the distal tibiofibular syndesmosis with small adjacent bone fragment. This may be chronic or represent acute fracture. Clinical correlation is recommended. No dislocation. Advanced osteopenia. Electronically Signed   By: Anner Crete M.D.   On: 05/26/2016 00:50   Dg C-arm 61-120 Min  05/26/2016  CLINICAL DATA:  Right tibial ORIF EXAM: DG C-ARM 61-120 MIN; RIGHT TIBIA AND FIBULA - 2 VIEW COMPARISON:  None FLUOROSCOPY TIME:  1 minutes 44 seconds FINDINGS: Comminuted distal tibial metaphysis fracture transfixed with an intra medullary nail and interlocking screws in near  anatomic alignment. Mildly comminuted and displaced oblique fracture of the proximal fibular diaphysis. IMPRESSION: ORIF right distal tibial fracture. Mildly comminuted and displaced oblique fracture of the proximal fibular diaphysis. Electronically Signed   By: Kathreen Devoid   On: 05/26/2016 19:45    Disposition: 01-Home or Self Care      Discharge Instructions    Call MD / Call 911    Complete by:  As directed   If you experience chest pain or shortness of breath, CALL 911 and be transported to the hospital emergency room.  If you develope a fever above 101 F, pus (white drainage) or increased drainage or redness at the wound, or calf pain, call your surgeon's office.     Constipation Prevention    Complete by:  As directed   Drink plenty of fluids.  Prune juice may be helpful.  You may use a stool softener, such as Colace (over the counter) 100 mg twice a day.  Use MiraLax (over the counter) for constipation as needed.     Diet - low sodium heart healthy    Complete by:  As directed  Discharge instructions    Complete by:  As directed   Non weight bearing right leg Ok for knee range of motion Ok to get leg wet in shower - dressings are waterproof     Increase activity slowly as tolerated    Complete by:  As directed               Signed: Aleiya Rye SCOTT 05/27/2016, 9:14 AM

## 2016-05-27 NOTE — Op Note (Signed)
[name], [name] NO.:  [id number]  MEDICAL RECORD NO.:  [id number]  LOCATION:  5N22C                        FACILITY:  [facility]  PHYSICIAN:  Anderson Malta, M.D.    DATE OF BIRTH:  January 12, 1946  DATE OF PROCEDURE: DATE OF DISCHARGE:                              OPERATIVE REPORT   PREOPERATIVE DIAGNOSIS:  Right distal tibia fracture.  POSTOPERATIVE DIAGNOSIS:  Right distal tibia fracture.  PROCEDURE:  Right distal tibia fracture intramedullary nailing.  SURGEON:  Anderson Malta, M.D.  ASSISTANT:  None.  ANESTHESIA:  General.  INDICATIONS:  [name] is a patient with right proximal fibular fracture, which will be treated nonoperatively.  She also has the right distal tibia fracture, comminuted in osteoporotic bone.  She presents for operative management after explanation of risks and benefits.  PROCEDURE IN DETAIL:  The patient was brought to the operating room where general endotracheal anesthesia was induced.  Preoperative antibiotics were administered.  Time-out was called.  Right leg was prescrubbed with alcohol and Betadine, allowed to air dry, prepped with DuraPrep solution and draped in a sterile manner.  Charlie Pitter was used to cover the operative field.  Leg was placed over the triangle.  Incision made over the patellar tendon.  The skin and subcutaneous tissue were sharply divided.  Patellar tendon divided.  Guidepin placed in the middle portion of the tibia plateau.  Guidepin placed, location checked under fluoroscopy in AP and lateral planes.  Proximal reaming performed. Guidepin placed across the fracture site, which was reduced under fluoroscopic visualization.  The osteoporotic bone reamed to 12.5 mm, distal end of the nail right at the physeal scar, this was as distal as possible.  An 11 x 33, Smith and Nephew nail was placed.  Two proximal interlocking screws were placed.  Two distal interlocking screws were placed; one medial lateral, one  anterior posterior.  On the anterior posterior screw, about a 1-inch incision was made and the dissection was performed and the tendons and neurovascular bundles were retracted in order to achieve flush placement of the screw head.  Following this, the interlock incisions were irrigated and closed using 2-0 Vicryl and 3-0 nylon.  Insertion site was closed using 0 Vicryl suture in the tendon, 2-0 Vicryl suture followed by 3-0 nylon.  The patient tolerated the procedure well without immediate complication, transferred to the recovery room in stable condition.  Aquacel dressing and Ace wrap were placed.     Anderson Malta, M.D.     GSD/MEDQ  D:  05/26/2016  T:  05/27/2016  Job:  [id number]

## 2016-05-27 NOTE — Progress Notes (Signed)
Patient will DC to: [facility] Anticipated DC date: 05/27/16 Family notified: Friend Transport by: Friend by car   Per MD patient ready for DC to [facility]. RN, patient, patient's family, and facility notified of DC. RN given number for report [phone]). [employer] signed on patient's chart.  CSW signing off.  Cedric Fishman, [facility] Social Worker [phone]

## 2016-05-27 NOTE — Care Management Note (Signed)
Case Management Note  Patient Details  Name: [name] MRN: [id number] Date of Birth: 10-13-1946  Subjective/Objective:  Pt admitted on 05/25/16 s/p fall with Rt distal tib-fib fracture.  PTA, pt independent, lives alone.                    Action/Plan: PT recommending SNF.  CSW consulted to facilitate dc to SNF; plan dc today.  Pt prefers [facility] in [place], if possible.  CSW made aware.    Expected Discharge Date:    05/27/2016             Expected Discharge Plan:  Skilled Nursing Facility  In-House Referral:  Clinical Social Work  Discharge planning Services  CM Consult  Post Acute Care Choice:    Choice offered to:     DME Arranged:    DME Agency:     HH Arranged:    [place] Agency:     Status of Service:  Completed, signed off  If discussed at [employer], dates discussed:    Additional Comments:  Reinaldo Raddle, RN, BSN  Trauma/Neuro ICU Case Manager [phone]

## 2016-05-27 NOTE — Evaluation (Signed)
Physical Therapy Evaluation Patient Details Name: [name] MRN: [id number] DOB: 01-02-1946 Today's Date: 05/27/2016   History of Present Illness  70 year old amatory female who lives alone who fell when she is going to the refrigerator and sustained a Rt tibia fx, now s/p IM nail. PMH: diabetes, hypertension, CAD, MI.   Clinical Impression  Patient is s/p above fall and resulting surgery with functional limitations listed below (see PT Problem List). Patient will benefit from skilled PT to increase their independence and safety with mobility to allow discharge to SNF for further rehabilitation when medically stable.        Follow Up Recommendations SNF;Supervision/Assistance - 24 hour    Equipment Recommendations  Other (comment) (to be addressed at next venue)    Recommendations for Other Services       Precautions / Restrictions Precautions Precautions: Fall Restrictions Weight Bearing Restrictions: Yes RLE Weight Bearing: Non weight bearing      Mobility  Bed Mobility Overal bed mobility: Needs Assistance Bed Mobility: Supine to Sit     Supine to sit: +2 for physical assistance;Mod assist     General bed mobility comments: assist provided with LEs and trunk, cues needed for use of rail and general sequence.   Transfers                 General transfer comment: not safe to attempt at this time  Ambulation/Gait                Stairs            Wheelchair Mobility    Modified Rankin (Stroke Patients Only)       Balance Overall balance assessment: Needs assistance Sitting-balance support: Single extremity supported Sitting balance-Leahy Scale: Poor                                       Pertinent Vitals/Pain Pain Assessment: Faces Faces Pain Scale: Hurts a little bit Pain Location: Rt LE Pain Descriptors / Indicators: Guarding Pain Intervention(s): Monitored during session    Home Living Family/patient  expects to be discharged to:: Skilled nursing facility Living Arrangements: Alone             Home Equipment: Environmental consultant - 4 wheels      Prior Function Level of Independence: Independent with assistive device(s)   Gait / Transfers Assistance Needed: rollator for ambulation           Hand Dominance        Extremity/Trunk Assessment   Upper Extremity Assessment: Generalized weakness           Lower Extremity Assessment: RLE deficits/detail RLE Deficits / Details: assist needed for moving Rt LE with bed mobility       Communication   Communication: No difficulties  Cognition Arousal/Alertness: Awake/alert Behavior During Therapy: WFL for tasks assessed/performed Overall Cognitive Status: Within Functional Limits for tasks assessed                      General Comments General comments (skin integrity, edema, etc.): Pt sitting EOB ~5 minutes with supervision to min assist.     Exercises        Assessment/Plan    PT Assessment Patient needs continued PT services  PT Diagnosis Difficulty walking;Generalized weakness   PT Problem List Decreased strength;Decreased range of motion;Decreased activity tolerance;Decreased balance;Decreased mobility  PT Treatment Interventions DME  instruction;Gait training;Functional mobility training;Therapeutic activities;Therapeutic exercise;Patient/family education   PT Goals (Current goals can be found in the Care Plan section) Acute Rehab PT Goals Patient Stated Goal: be able to go somewhere for more rehab after the hospital PT Goal Formulation: With patient Time For Goal Achievement: 06/10/16 Potential to Achieve Goals: Good    Frequency Min 3X/week   Barriers to discharge        Co-evaluation               End of Session   Activity Tolerance: Patient tolerated treatment well Patient left: in bed;with call bell/phone within reach Nurse Communication: Mobility status         Time: 1137-1201 PT  Time Calculation (min) (ACUTE ONLY): 24 min   Charges:   PT Evaluation $PT Eval Moderate Complexity: 1 Procedure PT Treatments $Therapeutic Activity: 8-22 mins   PT G Codes:        Cassell Clement, PT, CSCS Pager [phone] Office [phone]  05/27/2016, 12:50 PM

## 2016-05-27 NOTE — Progress Notes (Signed)
OT Cancellation Note  Patient Details Name: [name] MRN: [id number] DOB: 1946-08-11   Cancelled Treatment:    Reason Eval/Treat Not Completed: Other (comment) (Plan is for pt to d/c to SNF; will defer OT to next venue.) Please re-consult if needs change. Thank you for this referral.  Binnie Kand M.S., OTR/L Pager: [phone]  05/27/2016, 1:07 PM

## 2016-05-27 NOTE — Progress Notes (Signed)
Subjective:  Up in bed- no chest pain or dyspnea- she is unaware of her PSVT.  Objective:  Vital Signs in the last 24 hours: Temp:  [97.5 F (36.4 C)-98.3 F (36.8 C)] 98.3 F (36.8 C) (06/30 0447) Pulse Rate:  [66-138] 66 (06/30 0447) Resp:  [14-22] 16 (06/30 0447) BP: (125-173)/(49-96) 130/55 mmHg (06/30 0447) SpO2:  [95 %-100 %] 99 % (06/30 0447)  Intake/Output from previous day:  Intake/Output Summary (Last 24 hours) at 05/27/16 0811 Last data filed at 05/27/16 0806  Gross per 24 hour  Intake   1810 ml  Output   1375 ml  Net    435 ml    Physical Exam: General appearance: alert, cooperative and no distress Neck: no JVD Lungs: clear to auscultation bilaterally Heart: regular rate and rhythm Extremities: RLE cast in place Skin: Skin color, texture, turgor normal. No rashes or lesions Neurologic: Grossly normal   Rate: 70  Rhythm: normal sinus rhythm and several runs of PSVT- less than 15 beats  Lab Results:  Recent Labs  05/25/16 2006 05/25/16 2014  WBC 8.8  --   HGB 13.2 14.3  PLT 182  --     Recent Labs  05/25/16 2006 05/25/16 2014  NA 137 140  K 3.2* 3.2*  CL 101 101  CO2 26  --   GLUCOSE 157* 152*  BUN 17 18  CREATININE 0.81 0.70   No results for input(s): TROPONINI in the last 72 hours.  Invalid input(s): CK, MB No results for input(s): INR in the last 72 hours.  Scheduled Meds: . furosemide  40 mg Oral Daily  . insulin glargine  10 Units Subcutaneous QHS  . metFORMIN  1,000 mg Oral BID WC  . metoprolol  50 mg Oral BID  . pioglitazone  30 mg Oral Daily  . ramipril  10 mg Oral Daily  . rivaroxaban  10 mg Oral Daily  . simvastatin  40 mg Oral QHS  . vitamin B-12  1,000 mcg Oral Daily   Continuous Infusions: . lactated ringers 10 mL/hr at 05/26/16 1605   PRN Meds:.acetaminophen **OR** acetaminophen, docusate sodium, HYDROcodone-acetaminophen, metoCLOPramide **OR** metoCLOPramide (REGLAN) injection, nitroGLYCERIN, [DISCONTINUED]  ondansetron **OR** ondansetron (ZOFRAN) IV, ondansetron   Imaging: Imaging results have been reviewed   Assessment/Plan:  Pleasant 70 yo F w a h/o CAD -s/p DES x 2 to RCA (2014) in setting of UTI and NSTEMI, PSVT, type 2 NIDDM, HTN, and HLD was admitted on 05/25/16 after a mechanical fall, found to have a R distal tib-fib fracture. She underwent intramedullary nailing 05/26/16. After extubation in the PACU, she was observed to have multiple non-sustained episodes of SVT treated with a single dose of Metoprolol IV 5 mg. Her HR was reported to be up to the 140's with a stable BP. She had missed her evening dose of Metoprolol on 05/25/16 & her morning dose on 05/26/16 and her K+ was low. .  Principal Problem:   Tibia fracture after fall-s/p intramedulaary nailing 05/26/16 Active Problems:   PSVT-post op 05/26/16   Hypokalemia   CAD -S/P PCI Feb 2014   HTN (hypertension)   Type 2 diabetes mellitus without complication ([place])   History of NSTEMI Feb 2014   PLAN: Continue to monitor on telemetry, meds resumed, f/u K+ pending.   Kerin Ransom PA-C 05/27/2016, 8:11 AM [phone]    Agree with assessment and plan. Pt was discharged before I was able to see patient.   Troy Sine, MD, [facility]  05/29/2016 12:08 PM

## 2016-05-27 NOTE — NC FL2 (Signed)
[place] LEVEL OF CARE SCREENING TOOL     IDENTIFICATION  Patient Name: [name] Birthdate: August 21, 1946 Sex: female Admission Date (Current Location): 05/25/2016  [facility] and [place] Number:  Herbalist and Address:  [facility], [place] [address] [place], [place] [address]      Provider Number: [id number]  Attending Physician Name and Address:  Meredith Pel, MD  Relative Name and Phone Number:       Current Level of Care: Hospital Recommended Level of Care: [place] Prior Approval Number:    Date Approved/Denied:   PASRR Number: [id number]  Discharge Plan: SNF    Current Diagnoses: Patient Active Problem List   Diagnosis Date Noted  . Tibia fracture after fall-s/p intramedulaary nailing 05/26/16 05/26/2016  . Orthostatic hypotension   . Syncope and collapse 07/18/2015  . Compression fracture 07/15/2015  . Sepsis ([place]) 12/05/2014  . Constipation 12/04/2014  . Hematuria   . Intertrochanteric fracture of left hip ([place]) 11/14/2014  . Type 2 diabetes mellitus without complication ([place])   . Contusion of right leg 11/13/2013  . HTN (hypertension) 11/12/2013  . Fall 11/12/2013  . CAD -S/P PCI Feb 2014 01/11/2013  . Oral thrush 01/11/2013  . Hematochezia 01/08/2013  . PSVT-post op 05/26/16 01/05/2013  . History of NSTEMI Feb 2014 01/05/2013  . Hypokalemia 01/04/2013  . Fever 01/04/2013  . H/O vertebroplasty 01/04/2013  . Cellulitis and abscess of foot 03/31/2012  . Dehydration 03/31/2012  . Hyponatremia 03/31/2012    Orientation RESPIRATION BLADDER Height & Weight     Self, Time, Situation, Place  O2 (Nasal Cannula 2L) Continent, Indwelling catheter (Urinary catheter) Weight: 88.86 kg (195 lb 14.4 oz) Height:  5\' 8"  (172.7 cm)  BEHAVIORAL SYMPTOMS/MOOD NEUROLOGICAL BOWEL NUTRITION STATUS      Continent Diet (Please see DC Summary)  AMBULATORY STATUS COMMUNICATION OF NEEDS Skin   Limited  Assist   Surgical wounds (Closed incision on leg, back, hip)                       Personal Care Assistance Level of Assistance  Bathing, Feeding, Dressing Bathing Assistance: Limited assistance Feeding assistance: Independent Dressing Assistance: Limited assistance     Functional Limitations Info             SPECIAL CARE FACTORS FREQUENCY  PT (By licensed PT)     PT Frequency: not yet assessed              Contractures      Additional Factors Info  Code Status, Allergies, Insulin Sliding Scale Code Status Info: Full Allergies Info: Oxycodone   Insulin Sliding Scale Info: insulin glargine (LANTUS) injection 10 Units       Current Medications (05/27/2016):  This is the current hospital active medication list Current Facility-Administered Medications  Medication Dose Route Frequency Provider Last Rate Last Dose  . acetaminophen (TYLENOL) tablet 650 mg  650 mg Oral Q6H PRN Meredith Pel, MD       Or  . acetaminophen (TYLENOL) suppository 650 mg  650 mg Rectal Q6H PRN Meredith Pel, MD      . docusate sodium (COLACE) capsule 100 mg  100 mg Oral Daily PRN Meredith Pel, MD      . furosemide (LASIX) tablet 40 mg  40 mg Oral Daily Meredith Pel, MD   40 mg at 05/26/16 1000  . HYDROcodone-acetaminophen (NORCO/VICODIN) 5-325 MG per tablet  1-2 tablet  1-2 tablet Oral Q4H PRN Meredith Pel, MD      . insulin glargine (LANTUS) injection 10 Units  10 Units Subcutaneous QHS Meredith Pel, MD   10 Units at 05/27/16 0037  . lactated ringers infusion   Intravenous Continuous Meredith Pel, MD 10 mL/hr at 05/26/16 1605    . metFORMIN (GLUCOPHAGE) tablet 1,000 mg  1,000 mg Oral BID WC Meredith Pel, MD   1,000 mg at 05/26/16 0800  . metoCLOPramide (REGLAN) tablet 5-10 mg  5-10 mg Oral Q8H PRN Meredith Pel, MD       Or  . metoCLOPramide (REGLAN) injection 5-10 mg  5-10 mg Intravenous Q8H PRN Meredith Pel, MD      . metoprolol  (LOPRESSOR) tablet 50 mg  50 mg Oral BID Meredith Pel, MD   50 mg at 05/26/16 2326  . nitroGLYCERIN (NITROSTAT) SL tablet 0.4 mg  0.4 mg Sublingual Q5 min PRN Meredith Pel, MD      . ondansetron [facility]) injection 4 mg  4 mg Intravenous Q6H PRN Meredith Pel, MD      . ondansetron (ZOFRAN-ODT) disintegrating tablet 4 mg  4 mg Oral Q8H PRN Meredith Pel, MD      . pioglitazone (ACTOS) tablet 30 mg  30 mg Oral Daily Meredith Pel, MD   30 mg at 05/26/16 1000  . ramipril (ALTACE) capsule 10 mg  10 mg Oral Daily Meredith Pel, MD   10 mg at 05/26/16 1000  . rivaroxaban (XARELTO) tablet 10 mg  10 mg Oral Daily Meredith Pel, MD      . simvastatin (ZOCOR) tablet 40 mg  40 mg Oral QHS Meredith Pel, MD   40 mg at 05/26/16 2326  . vitamin B-12 (CYANOCOBALAMIN) tablet 1,000 mcg  1,000 mcg Oral Daily Meredith Pel, MD   1,000 mcg at 05/26/16 1000     Discharge Medications: Please see discharge summary for a list of discharge medications.  Relevant Imaging Results:  Relevant Lab Results:   Additional Information SSN: [id number]  [place], [place]

## 2016-05-30 ENCOUNTER — Other Ambulatory Visit: Payer: Self-pay

## 2016-05-30 ENCOUNTER — Encounter: Payer: Self-pay | Admitting: Internal Medicine

## 2016-05-30 ENCOUNTER — Non-Acute Institutional Stay (SKILLED_NURSING_FACILITY): Payer: Medicare Other | Admitting: Internal Medicine

## 2016-05-30 DIAGNOSIS — Z794 Long term (current) use of insulin: Secondary | ICD-10-CM

## 2016-05-30 DIAGNOSIS — I471 Supraventricular tachycardia: Secondary | ICD-10-CM

## 2016-05-30 DIAGNOSIS — E11319 Type 2 diabetes mellitus with unspecified diabetic retinopathy without macular edema: Secondary | ICD-10-CM

## 2016-05-30 DIAGNOSIS — I2510 Atherosclerotic heart disease of native coronary artery without angina pectoris: Secondary | ICD-10-CM

## 2016-05-30 DIAGNOSIS — I10 Essential (primary) hypertension: Secondary | ICD-10-CM

## 2016-05-30 DIAGNOSIS — Z9861 Coronary angioplasty status: Secondary | ICD-10-CM | POA: Diagnosis not present

## 2016-05-30 DIAGNOSIS — F4322 Adjustment disorder with anxiety: Secondary | ICD-10-CM | POA: Diagnosis not present

## 2016-05-30 DIAGNOSIS — Z967 Presence of other bone and tendon implants: Secondary | ICD-10-CM

## 2016-05-30 DIAGNOSIS — K5900 Constipation, unspecified: Secondary | ICD-10-CM | POA: Diagnosis not present

## 2016-05-30 DIAGNOSIS — Z8781 Personal history of (healed) traumatic fracture: Secondary | ICD-10-CM | POA: Diagnosis not present

## 2016-05-30 DIAGNOSIS — Z9889 Other specified postprocedural states: Secondary | ICD-10-CM

## 2016-05-30 MED ORDER — HYDROCODONE-ACETAMINOPHEN 5-325 MG PO TABS: ORAL_TABLET | ORAL | Status: DC

## 2016-05-30 NOTE — Progress Notes (Signed)
Patient ID: [name], female   DOB: 1946/04/17, 70 y.o.   MRN: [id number]    HISTORY AND PHYSICAL   DATE:  05/30/16  Location:  Nursing Home Location: [facility]    Place of Service: SNF (31)   Extended Emergency Contact Information Primary Emergency Contact: Hendricks [place] Phone: [phone] Mobile Phone: [phone] Relation: Friend Secondary Emergency Contact: Whittington,Ray  [place] Phone: [phone] Relation: Other  Advanced Directive information  FULL CODE  Chief Complaint  Patient presents with  . New Admit To SNF    HPI:  70 yo female seen today as a new admission into SNF following hospital stay for tibia fx s/p fall, hypokalemia, PSVT post op, CAD s/p PCI in Feb 2014, hx NSTEMI, HTN, DM2. She fell at home while standing at microwave and holding her walker. She denies head trauma. Fall unwitnessed. She is not sure what prompted the fall. She underwent IM nail/ORIF to right tibia on 6/29th. She experienced postop PSVT. She presents to SNF for short term rehab  She reports constipation today. No other concerns. She is tolerating tx. No falls. No nursing issues. Admits to being anxious but declines any medication at this time. No depression. Appetite ok. Sleeping well. Pain controlled on Norco  CAD/HTN/hx MI/hx SVT - BP stable on lopressor, lasix and ramipril. She has prn SLNTG but has not used any since admission. She is on a statin and takes xeralto. Followed by cardio  DM - no low BS reactions on lantus, actos and metformin. She takes ACEI and statin. Last A1c 5.3%  Hyperlipidemia - stable on simvastatin  Hx anxiety - currently takes no meds  Chronic back/hx compression fx - has had kyphoplasty in the past  Osteopenia - by xray. She has taken bisphosphonate tx in the past. No recent DXA  She takes vitamins daily  Past Medical History  Diagnosis Date  . Hypertension   .  Hypercholesteremia   . CAD (coronary artery disease)     a. NSTEMI in setting of UTI and SVT 12/2012 => LHC 01/08/13: Proximal LAD 30%, ostial diagonal 30-40%, proximal RCA 95%, inferior AK, EF 45-50% => PCI: Promus DES x 2 to RCA;  b. Echo 01/05/13: EF 10-17%, grade 1 diastolic dysfunction, MAC   . SVT (supraventricular tachycardia) ([place])   . Rectal ulceration     colonoscopy 12/2012  . Anxiety     Due to current back pain and has to lie flat.  . Arthritis   . History of blood transfusion   . Myocardial infarction ([place]) 2014  . Type II diabetes mellitus ([place])   . Compression fracture     thoracic 12  . Edema     Past Surgical History  Procedure Laterality Date  . Eye surgery Bilateral "many"  . Tubal ligation    . Kyphoplasty  12/27/2012    Procedure: KYPHOPLASTY;  Surgeon: Sinclair Ship, MD;  Location: [facility];  Service: Orthopedics;  Laterality: Bilateral;  T-8 kyphoplasty  . Colonoscopy Left 01/12/2013    Procedure: COLONOSCOPY;  Surgeon: Wonda Horner, MD;  Location: [facility] ENDOSCOPY;  Service: Endoscopy;  Laterality: Left;  . Colonoscopy N/A 01/14/2013    Procedure: COLONOSCOPY;  Surgeon: Wonda Horner, MD;  Location: [facility] ENDOSCOPY;  Service: Endoscopy;  Laterality: N/A;  Rm 2034, attemped Colon on 2-15=inadequate prep  . Coronary angioplasty  12/2012  . Left heart catheterization with coronary angiogram N/A 01/08/2013    Procedure:  LEFT HEART CATHETERIZATION WITH CORONARY ANGIOGRAM;  Surgeon: Thayer Headings, MD;  Location: [facility] CATH LAB;  Service: Cardiovascular;  Laterality: N/A;  . Percutaneous stent intervention N/A 01/15/2013    Procedure: PERCUTANEOUS STENT INTERVENTION;  Surgeon: Peter M Martinique, MD;  Location: [facility] CATH LAB;  Service: Cardiovascular;  Laterality: N/A;  . Orif hip fracture Left 11/15/2014    Procedure: OPEN REDUCTION INTERNAL FIXATION HIP INTERTROCH;  Surgeon: Mcarthur Rossetti, MD;  Location: [place];  Service: Orthopedics;  Laterality: Left;  Marland Kitchen Kyphoplasty N/A  07/15/2015    Procedure: T11 KYPHOPLASTY;  Surgeon: Phylliss Bob, MD;  Location: [place];  Service: Orthopedics;  Laterality: N/A;  T11 kyphoplasty  . Cataract extraction, bilateral Bilateral   . Kyphoplasty N/A 09/17/2015    Procedure: T12 kyphoplasty;  Surgeon: Phylliss Bob, MD;  Location: [place];  Service: Orthopedics;  Laterality: N/A;  T12 kyphoplasty  . Fracture surgery    . Tibia im nail insertion Right 05/26/2016    Procedure: INTRAMEDULLARY (IM) NAIL TIBIAL;  Surgeon: Meredith Pel, MD;  Location: [place];  Service: Orthopedics;  Laterality: Right;    Patient Care Team: Shirline Frees, MD as PCP - General (Family Medicine)  Social History   Social History  . Marital Status: Widowed    Spouse Name: N/A  . Number of Children: N/A  . Years of Education: N/A   Occupational History  . Not on file.   Social History Main Topics  . Smoking status: Never Smoker   . Smokeless tobacco: Never Used  . Alcohol Use: No  . Drug Use: No  . Sexual Activity: Yes   Other Topics Concern  . Not on file   Social History Narrative     reports that she has never smoked. She has never used smokeless tobacco. She reports that she does not drink alcohol or use illicit drugs.  Family History  Problem Relation Age of Onset  . Hypertension Brother    Family Status  Relation Status Death Age  . Mother Deceased   . Father Deceased   . Sister Alive   . Brother Marketing executive History  Administered Date(s) Administered  . Tdap 03/31/2012    Allergies  Allergen Reactions  . Oxycodone Nausea Only    Medications: Patient's Medications  New Prescriptions   No medications on file  Previous Medications   DOCUSATE SODIUM (COLACE) 100 MG CAPSULE    Take 1 capsule (100 mg total) by mouth daily as needed for mild constipation.   FUROSEMIDE (LASIX) 40 MG TABLET    Take 40 mg by mouth daily.   HYDROCODONE-ACETAMINOPHEN (NORCO/VICODIN) 5-325 MG TABLET    Every 4 hours as needed  for breakthrough pain   LANTUS 100 UNIT/ML INJECTION    Take 10 Units by mouth daily.   METFORMIN (GLUCOPHAGE) 1000 MG TABLET    Take 1 tablet by mouth 2 (two) times daily.   METOPROLOL (LOPRESSOR) 50 MG TABLET    Take 1 tablet (50 mg total) by mouth 2 (two) times daily.   NITROGLYCERIN (NITROSTAT) 0.4 MG SL TABLET    Place 1 tablet (0.4 mg total) under the tongue every 5 (five) minutes as needed for chest pain.   ONDANSETRON (ZOFRAN ODT) 4 MG DISINTEGRATING TABLET    Take 1 tablet (4 mg total) by mouth every 8 (eight) hours as needed for nausea or vomiting.   PIOGLITAZONE (ACTOS) 30 MG TABLET    Take 1 tablet by mouth daily.  RAMIPRIL (ALTACE) 10 MG CAPSULE    Take 1 capsule by mouth daily.   RIVAROXABAN (XARELTO) 10 MG TABS TABLET    Take 1 tablet (10 mg total) by mouth daily.   SIMVASTATIN (ZOCOR) 40 MG TABLET    Take 40 mg by mouth at bedtime.   VITAMIN B-12 (CYANOCOBALAMIN) 1000 MCG TABLET    Take 1,000 mcg by mouth daily.  Modified Medications   No medications on file  Discontinued Medications   No medications on file    Review of Systems  Filed Vitals:   05/30/16 1144  BP: 124/70  Pulse: 63  Temp: 97.4 F (36.3 C)   There is no weight on file to calculate BMI.  Physical Exam  Constitutional: She is oriented to person, place, and time. She appears well-developed and well-nourished.  HENT:  Mouth/Throat: Oropharynx is clear and moist. No oropharyngeal exudate.  Eyes: Pupils are equal, round, and reactive to light. No scleral icterus.  Neck: Neck supple. Carotid bruit is not present. No tracheal deviation present. No thyromegaly present.  Cardiovascular: Normal rate, regular rhythm and intact distal pulses.  Exam reveals no gallop and no friction rub.   Murmur (1/6 SEM) heard. R>L LE edema. No calf TTP on left. Right leg wrapped  Pulmonary/Chest: Effort normal and breath sounds normal. No stridor. No respiratory distress. She has no wheezes. She has no rales.  Abdominal:  Soft. Bowel sounds are normal. She exhibits no distension and no mass. There is no hepatomegaly. There is no tenderness. There is no rebound and no guarding.  Musculoskeletal: She exhibits edema and tenderness.  FROM right toes. RLE dsg c/d/i  Lymphadenopathy:    She has no cervical adenopathy.  Neurological: She is alert and oriented to person, place, and time.  Skin: Skin is warm and dry. No rash noted.  Psychiatric: She has a normal mood and affect. Her behavior is normal. Thought content normal. Her speech is rapid and/or pressured.     Labs reviewed: Admission on 05/25/2016, Discharged on 05/27/2016  Component Date Value Ref Range Status  . Sodium 05/25/2016 140  135 - 145 mmol/L Final  . Potassium 05/25/2016 3.2* 3.5 - 5.1 mmol/L Final  . Chloride 05/25/2016 101  101 - 111 mmol/L Final  . BUN 05/25/2016 18  6 - 20 mg/dL Final  . Creatinine, Ser 05/25/2016 0.70  0.44 - 1.00 mg/dL Final  . Glucose, Bld 05/25/2016 152* 65 - 99 mg/dL Final  . Calcium, Ion 05/25/2016 1.07* 1.12 - 1.23 mmol/L Final  . TCO2 05/25/2016 25  0 - 100 mmol/L Final  . Hemoglobin 05/25/2016 14.3  12.0 - 15.0 g/dL Final  . HCT 05/25/2016 42.0  36.0 - 46.0 % Final  . WBC 05/25/2016 8.8  4.0 - 10.5 K/uL Final  . RBC 05/25/2016 4.34  3.87 - 5.11 MIL/uL Final  . Hemoglobin 05/25/2016 13.2  12.0 - 15.0 g/dL Final  . HCT 05/25/2016 41.1  36.0 - 46.0 % Final  . MCV 05/25/2016 94.7  78.0 - 100.0 fL Final  . MCH 05/25/2016 30.4  26.0 - 34.0 pg Final  . MCHC 05/25/2016 32.1  30.0 - 36.0 g/dL Final  . RDW 05/25/2016 13.3  11.5 - 15.5 % Final  . Platelets 05/25/2016 182  150 - 400 K/uL Final  . Neutrophils Relative % 05/25/2016 88   Final  . Neutro Abs 05/25/2016 7.7  1.7 - 7.7 K/uL Final  . Lymphocytes Relative 05/25/2016 6   Final  . Lymphs Abs 05/25/2016  0.5* 0.7 - 4.0 K/uL Final  . Monocytes Relative 05/25/2016 6   Final  . Monocytes Absolute 05/25/2016 0.6  0.1 - 1.0 K/uL Final  . Eosinophils Relative  05/25/2016 0   Final  . Eosinophils Absolute 05/25/2016 0.0  0.0 - 0.7 K/uL Final  . Basophils Relative 05/25/2016 0   Final  . Basophils Absolute 05/25/2016 0.0  0.0 - 0.1 K/uL Final  . Sodium 05/25/2016 137  135 - 145 mmol/L Final  . Potassium 05/25/2016 3.2* 3.5 - 5.1 mmol/L Final  . Chloride 05/25/2016 101  101 - 111 mmol/L Final  . CO2 05/25/2016 26  22 - 32 mmol/L Final  . Glucose, Bld 05/25/2016 157* 65 - 99 mg/dL Final  . BUN 05/25/2016 17  6 - 20 mg/dL Final  . Creatinine, Ser 05/25/2016 0.81  0.44 - 1.00 mg/dL Final  . Calcium 05/25/2016 8.4* 8.9 - 10.3 mg/dL Final  . GFR calc non Af Amer 05/25/2016 >60  >60 mL/min Final  . GFR calc Af Amer 05/25/2016 >60  >60 mL/min Final   Comment: (NOTE) The eGFR has been calculated using the CKD EPI equation. This calculation has not been validated in all clinical situations. eGFR's persistently <60 mL/min signify possible Chronic Kidney Disease.   . Anion gap 05/25/2016 10  5 - 15 Final  . Total CK 05/25/2016 52  38 - 234 U/L Final  . Color, Urine 05/25/2016 YELLOW  YELLOW Final  . APPearance 05/25/2016 CLOUDY* CLEAR Final  . Specific Gravity, Urine 05/25/2016 1.019  1.005 - 1.030 Final  . pH 05/25/2016 8.5* 5.0 - 8.0 Final  . Glucose, UA 05/25/2016 NEGATIVE  NEGATIVE mg/dL Final  . Hgb urine dipstick 05/25/2016 NEGATIVE  NEGATIVE Final  . Bilirubin Urine 05/25/2016 NEGATIVE  NEGATIVE Final  . Ketones, ur 05/25/2016 15* NEGATIVE mg/dL Final  . Protein, ur 05/25/2016 100* NEGATIVE mg/dL Final  . Nitrite 05/25/2016 POSITIVE* NEGATIVE Final  . Leukocytes, UA 05/25/2016 MODERATE* NEGATIVE Final  . Glucose-Capillary 05/25/2016 131* 65 - 99 mg/dL Final  . Squamous Epithelial / LPF 05/25/2016 0-5* NONE SEEN Final  . WBC, UA 05/25/2016 6-30  0 - 5 WBC/hpf Final  . RBC / HPF 05/25/2016 0-5  0 - 5 RBC/hpf Final  . Bacteria, UA 05/25/2016 MANY* NONE SEEN Final  . Crystals 05/25/2016 TRIPLE PHOSPHATE CRYSTALS* NEGATIVE Final  . MRSA by PCR  05/26/2016 NEGATIVE  NEGATIVE Final   Comment:        The GeneXpert MRSA Assay (FDA approved for NASAL specimens only), is one component of a comprehensive MRSA colonization surveillance program. It is not intended to diagnose MRSA infection nor to guide or monitor treatment for MRSA infections.   . Glucose-Capillary 05/26/2016 85  65 - 99 mg/dL Final  . Glucose-Capillary 05/26/2016 105* 65 - 99 mg/dL Final  . Glucose-Capillary 05/26/2016 126* 65 - 99 mg/dL Final  . Comment 1 05/26/2016 Notify RN   Final  . Comment 2 05/26/2016 Document in Chart   Final  . Glucose-Capillary 05/26/2016 105* 65 - 99 mg/dL Final  . TSH 05/27/2016 0.862  0.350 - 4.500 uIU/mL Final  . Magnesium 05/27/2016 2.0  1.7 - 2.4 mg/dL Final  . Glucose-Capillary 05/27/2016 132* 65 - 99 mg/dL Final  . Sodium 05/27/2016 138  135 - 145 mmol/L Final  . Potassium 05/27/2016 4.4  3.5 - 5.1 mmol/L Final  . Chloride 05/27/2016 107  101 - 111 mmol/L Final  . CO2 05/27/2016 25  22 - 32 mmol/L Final  .  Glucose, Bld 05/27/2016 194* 65 - 99 mg/dL Final  . BUN 05/27/2016 19  6 - 20 mg/dL Final  . Creatinine, Ser 05/27/2016 0.69  0.44 - 1.00 mg/dL Final  . Calcium 05/27/2016 7.9* 8.9 - 10.3 mg/dL Final  . GFR calc non Af Amer 05/27/2016 >60  >60 mL/min Final  . GFR calc Af Amer 05/27/2016 >60  >60 mL/min Final   Comment: (NOTE) The eGFR has been calculated using the CKD EPI equation. This calculation has not been validated in all clinical situations. eGFR's persistently <60 mL/min signify possible Chronic Kidney Disease.   . Anion gap 05/27/2016 6  5 - 15 Final  . Glucose-Capillary 05/27/2016 164* 65 - 99 mg/dL Final  . Comment 1 05/27/2016 Notify RN   Final  . Comment 2 05/27/2016 Document in Chart   Final    Dg Chest 1 View  05/25/2016  CLINICAL DATA:  Fall at home this evening. EXAM: CHEST 1 VIEW COMPARISON:  09/17/2015 FINDINGS: The lungs are clear wiithout focal pneumonia, edema, pneumothorax or pleural effusion.  The cardio pericardial silhouette is enlarged. Patient is status post multilevel thoracic vertebral augmentation. IMPRESSION: No acute cardiopulmonary findings. Electronically Signed   By: Misty Stanley M.D.   On: 05/25/2016 21:05   Dg Tibia/fibula Right  05/27/2016  CLINICAL DATA:  ORIF right tibia. EXAM: RIGHT TIBIA AND FIBULA - 2 VIEW COMPARISON:  May 25, 2016 FINDINGS: The proximal fibular fracture is again identified. A rod has been placed across the comminuted distal tibial fracture. Alignment is improved in the interval. Soft tissue swelling is identified. The rod is affixed proximally and distally with screws. IMPRESSION: Tibial fracture repair as above. Electronically Signed   By: Dorise Bullion III M.D   On: 05/27/2016 00:43   Dg Tibia/fibula Right  05/26/2016  CLINICAL DATA:  Right tibial ORIF EXAM: DG C-ARM 61-120 MIN; RIGHT TIBIA AND FIBULA - 2 VIEW COMPARISON:  None FLUOROSCOPY TIME:  1 minutes 44 seconds FINDINGS: Comminuted distal tibial metaphysis fracture transfixed with an intra medullary nail and interlocking screws in near anatomic alignment. Mildly comminuted and displaced oblique fracture of the proximal fibular diaphysis. IMPRESSION: ORIF right distal tibial fracture. Mildly comminuted and displaced oblique fracture of the proximal fibular diaphysis. Electronically Signed   By: Kathreen Devoid   On: 05/26/2016 19:45   Dg Tibia/fibula Right  05/25/2016  CLINICAL DATA:  [place] at home this evening.  Initial encounter. EXAM: RIGHT TIBIA AND FIBULA - 2 VIEW COMPARISON:  None. FINDINGS: Comminuted fracture proximal fibula is assisted with a comminuted fracture the distal tibia. Bones are demineralized. IMPRESSION: Comminuted fractures of the proximal fibula and distal tibia. Electronically Signed   By: Misty Stanley M.D.   On: 05/25/2016 21:02   Dg Ankle 2 Views Right  05/25/2016  CLINICAL DATA:  Fall at home.  Initial encounter. EXAM: RIGHT ANKLE - 2 VIEW COMPARISON:  None. FINDINGS:  Two-view exam of the right ankle shows a comminuted distal tibia fracture without obvious extension into the ankle mortise. Mild apex medial angulation evident. Bones are diffusely demineralized. IMPRESSION: Comminuted slightly angulated fracture the distal tibia. Electronically Signed   By: Misty Stanley M.D.   On: 05/25/2016 21:03   Ct Head Wo Contrast  05/26/2016  CLINICAL DATA:  70 year old female with fall EXAM: CT HEAD WITHOUT CONTRAST TECHNIQUE: Contiguous axial images were obtained from the base of the skull through the vertex without intravenous contrast. COMPARISON:  Head CT dated 07/18/2015 FINDINGS: There is mild age-related atrophy  and chronic microvascular ischemic changes. No acute intracranial hemorrhage. No mass effect or midline shift. The visualized paranasal sinuses and mastoid air cells are clear. The calvarium is intact. IMPRESSION: No acute intracranial hemorrhage. Electronically Signed   By: Anner Crete M.D.   On: 05/26/2016 00:07   Ct Tibia Fibula Right Wo Contrast  05/26/2016  CLINICAL DATA:  70 year old female with fall and right lower extremity pain. EXAM: CT OF THE RIGHT TIBIA FIBULA WITHOUT CONTRAST TECHNIQUE: Multidetector CT imaging was performed according to the standard protocol. Multiplanar CT image reconstructions were also generated. COMPARISON:  Radiograph dated 05/25/2016 FINDINGS: There is a comminuted fracture of the proximal fibula with approximately 6 mm lateral displacement of the distal fracture fragment. There is also a comminuted nondisplaced fracture of the distal tibia. There is advanced osteopenia which limits evaluation for fracture. A hairline linear lucency through the tibial plafond (series 2, image 159) likely represents a vascular groove or related to osteopenia. Intra-articular extension of fracture is less likely. There is however irregularity of the lateral aspect of the distal tibia at the anterior tibiofibular syndesmosis (series 2, image 160  and coronal series 4, image 41- 45). Small bone fragment noted adjacent to the anterior corner of the distal tibiotalar syndesmosis. This may represent a fracture or degenerative changes. There is no dislocation. There is no joint effusion. There is mild soft tissue edema in the distal calf. No large hematoma. IMPRESSION: Comminuted fractures of the proximal fibula and distal tibia. Irregularity of the anterior aspect of the distal tibiofibular syndesmosis with small adjacent bone fragment. This may be chronic or represent acute fracture. Clinical correlation is recommended. No dislocation. Advanced osteopenia. Electronically Signed   By: Anner Crete M.D.   On: 05/26/2016 00:50   Dg C-arm 61-120 Min  05/26/2016  CLINICAL DATA:  Right tibial ORIF EXAM: DG C-ARM 61-120 MIN; RIGHT TIBIA AND FIBULA - 2 VIEW COMPARISON:  None FLUOROSCOPY TIME:  1 minutes 44 seconds FINDINGS: Comminuted distal tibial metaphysis fracture transfixed with an intra medullary nail and interlocking screws in near anatomic alignment. Mildly comminuted and displaced oblique fracture of the proximal fibular diaphysis. IMPRESSION: ORIF right distal tibial fracture. Mildly comminuted and displaced oblique fracture of the proximal fibular diaphysis. Electronically Signed   By: Kathreen Devoid   On: 05/26/2016 19:45     Assessment/Plan   ICD-9-CM ICD-10-CM   1. Constipation, unspecified constipation type 564.00 K59.00   2. Adjustment disorder with anxious mood 309.24 F43.22   3. Essential hypertension 401.9 I10   4. Type 2 diabetes mellitus with retinopathy, with long-term current use of insulin, macular edema presence unspecified, unspecified laterality, unspecified retinopathy severity (HCC) 250.50 E11.319    362.01 Z79.4    V58.67    5. S/P ORIF (open reduction internal fixation) fracture V45.89 Z96.7    V15.51 Z87.81    right distal tibia  6. CAD -S/P PCI Feb 2014 414.01 I25.10    V45.82 Z98.61   7. Paroxysmal SVT  (supraventricular tachycardia) (HCC) 427.0 I47.1      Start miralax daily for constipation  Cont other meds as ordered  T/c addition of buspar for anxiety. Pt declines today  F/u with ortho as scheduled  PT/OT/ST as ordered  GOAL: short term rehab and d/c home when medically appropriate. Communicated with pt and nursing.  Will follow  Azalea Cedar S. Perlie Gold  [facility] [address] [place], [place] [address] [phone] Cell (Monday-Friday  8 AM - 5 PM) [phone] After 5 PM and follow prompts

## 2016-05-30 NOTE — Anesthesia Postprocedure Evaluation (Signed)
Anesthesia Post Note  Patient: [name]  Procedure(s) Performed: Procedure(s) (LRB): INTRAMEDULLARY (IM) NAIL TIBIAL (Right)  Patient location during evaluation: PACU Anesthesia Type: General Level of consciousness: awake and alert and patient cooperative Pain management: pain level controlled Vital Signs Assessment: post-procedure vital signs reviewed and stable Respiratory status: spontaneous breathing and respiratory function stable Cardiovascular status: stable Anesthetic complications: no Comments: Pt is having periodic episodes of SVT up to a rate of 140.  Metoprolol was given to help control the rate.  Cardiology was consulted and pt sent to telemetry bed.  Pt was otherwise asymptomatic from the SVT.    Last Vitals:  Filed Vitals:   05/26/16 2300 05/27/16 0447  BP: 125/49 130/55  Pulse: 69 66  Temp: 36.7 C 36.8 C  Resp: 16 16    Last Pain:  Filed Vitals:   05/27/16 1407  PainSc: 0-No pain                 Viyaan Champine S

## 2016-05-31 DIAGNOSIS — F4322 Adjustment disorder with anxiety: Secondary | ICD-10-CM | POA: Insufficient documentation

## 2016-06-01 ENCOUNTER — Encounter (HOSPITAL_COMMUNITY): Payer: Self-pay | Admitting: Orthopedic Surgery

## 2016-06-01 NOTE — OR Nursing (Signed)
Implant record corrected to show only 1 32 mm long screw was used.  Corrected by Carlyle Basques, RN on 06-01-2016 at [phone].

## 2016-06-15 ENCOUNTER — Non-Acute Institutional Stay (SKILLED_NURSING_FACILITY): Payer: Medicare Other | Admitting: Nurse Practitioner

## 2016-06-15 ENCOUNTER — Encounter: Payer: Self-pay | Admitting: Nurse Practitioner

## 2016-06-15 DIAGNOSIS — Z9889 Other specified postprocedural states: Secondary | ICD-10-CM

## 2016-06-15 DIAGNOSIS — I471 Supraventricular tachycardia: Secondary | ICD-10-CM

## 2016-06-15 DIAGNOSIS — Z8781 Personal history of (healed) traumatic fracture: Secondary | ICD-10-CM

## 2016-06-15 DIAGNOSIS — E11319 Type 2 diabetes mellitus with unspecified diabetic retinopathy without macular edema: Secondary | ICD-10-CM | POA: Diagnosis not present

## 2016-06-15 DIAGNOSIS — Z794 Long term (current) use of insulin: Secondary | ICD-10-CM

## 2016-06-15 DIAGNOSIS — Z967 Presence of other bone and tendon implants: Secondary | ICD-10-CM

## 2016-06-15 DIAGNOSIS — I10 Essential (primary) hypertension: Secondary | ICD-10-CM

## 2016-06-15 DIAGNOSIS — K5900 Constipation, unspecified: Secondary | ICD-10-CM

## 2016-06-15 DIAGNOSIS — S82201D Unspecified fracture of shaft of right tibia, subsequent encounter for closed fracture with routine healing: Secondary | ICD-10-CM

## 2016-06-15 NOTE — Progress Notes (Signed)
Nursing Home Location:  [facility]   Place of Service: SNF (31)  PCP: Shirline Frees, MD  Allergies  Allergen Reactions  . Oxycodone Nausea Only    Chief Complaint  Patient presents with  . Discharge Note    HPI:  Patient is a 70 y.o. female seen today at [facility] for discharge home. She is at [facility] following hospital stay for tibia fx s/p fall, hypokalemia, PSVT post op. Pt hx of CAD s/p PCI in Feb 2014, hx NSTEMI, HTN, DM2. She fell at home while standing at microwave and holding her walker. She denies head trauma. Fall unwitnessed. She is not sure what prompted the fall. She underwent IM nail/ORIF to right tibia on 6/29th. She experienced postop PSVT. Pt has been at [facility] for rehab. Patient currently doing well with therapy, now stable to discharge home with home health. Pt reports pain is well controlled. Still having issues with constipation but this is not new and manages well at home.   Review of Systems:  Review of Systems  Constitutional: Negative for activity change, appetite change, fatigue and unexpected weight change.  HENT: Negative for congestion and hearing loss.   Eyes: Negative.   Respiratory: Negative for cough and shortness of breath.   Cardiovascular: Negative for chest pain, palpitations and leg swelling.  Gastrointestinal: Positive for constipation. Negative for abdominal pain and diarrhea.  Genitourinary: Negative for dysuria and difficulty urinating.  Musculoskeletal: Negative for myalgias and arthralgias.  Skin: Negative for color change and wound.  Neurological: Negative for dizziness and weakness.  Psychiatric/Behavioral: Negative for behavioral problems, confusion and agitation.    Past Medical History  Diagnosis Date  . Hypertension   . Hypercholesteremia   . CAD (coronary artery disease)     a. NSTEMI in setting of UTI and SVT 12/2012 => LHC 01/08/13: Proximal LAD 30%, ostial diagonal 30-40%, proximal RCA 95%,  inferior AK, EF 45-50% => PCI: Promus DES x 2 to RCA;  b. Echo 01/05/13: EF [id number], grade 1 diastolic dysfunction, MAC   . SVT (supraventricular tachycardia) ([place])   . Rectal ulceration     colonoscopy 12/2012  . Anxiety     Due to current back pain and has to lie flat.  . Arthritis   . History of blood transfusion   . Myocardial infarction ([place]) 2014  . Type II diabetes mellitus ([place])   . Compression fracture     thoracic 12  . Edema    Past Surgical History  Procedure Laterality Date  . Eye surgery Bilateral "many"  . Tubal ligation    . Kyphoplasty  12/27/2012    Procedure: KYPHOPLASTY;  Surgeon: Sinclair Ship, MD;  Location: [place];  Service: Orthopedics;  Laterality: Bilateral;  T-8 kyphoplasty  . Colonoscopy Left 01/12/2013    Procedure: COLONOSCOPY;  Surgeon: Wonda Horner, MD;  Location: [facility] ENDOSCOPY;  Service: Endoscopy;  Laterality: Left;  . Colonoscopy N/A 01/14/2013    Procedure: COLONOSCOPY;  Surgeon: Wonda Horner, MD;  Location: [facility] ENDOSCOPY;  Service: Endoscopy;  Laterality: N/A;  Rm 2034, attemped Colon on 2-15=inadequate prep  . Coronary angioplasty  12/2012  . Left heart catheterization with coronary angiogram N/A 01/08/2013    Procedure: LEFT HEART CATHETERIZATION WITH CORONARY ANGIOGRAM;  Surgeon: Thayer Headings, MD;  Location: [facility] CATH LAB;  Service: Cardiovascular;  Laterality: N/A;  . Percutaneous stent intervention N/A 01/15/2013    Procedure: PERCUTANEOUS STENT INTERVENTION;  Surgeon: Peter M Martinique, MD;  Location: [facility]  CATH LAB;  Service: Cardiovascular;  Laterality: N/A;  . Orif hip fracture Left 11/15/2014    Procedure: OPEN REDUCTION INTERNAL FIXATION HIP INTERTROCH;  Surgeon: Mcarthur Rossetti, MD;  Location: [place];  Service: Orthopedics;  Laterality: Left;  Marland Kitchen Kyphoplasty N/A 07/15/2015    Procedure: T11 KYPHOPLASTY;  Surgeon: Phylliss Bob, MD;  Location: [place];  Service: Orthopedics;  Laterality: N/A;  T11 kyphoplasty  . Cataract extraction, bilateral  Bilateral   . Kyphoplasty N/A 09/17/2015    Procedure: T12 kyphoplasty;  Surgeon: Phylliss Bob, MD;  Location: [place];  Service: Orthopedics;  Laterality: N/A;  T12 kyphoplasty  . Fracture surgery    . Tibia im nail insertion Right 05/26/2016    Procedure: INTRAMEDULLARY (IM) NAIL TIBIAL;  Surgeon: Meredith Pel, MD;  Location: [place];  Service: Orthopedics;  Laterality: Right;   Social History:   reports that she has never smoked. She has never used smokeless tobacco. She reports that she does not drink alcohol or use illicit drugs.  Family History  Problem Relation Age of Onset  . Hypertension Brother     Medications: Patient's Medications  New Prescriptions   No medications on file  Previous Medications   DOCUSATE SODIUM (COLACE) 100 MG CAPSULE    Take 1 capsule (100 mg total) by mouth daily as needed for mild constipation.   FUROSEMIDE (LASIX) 40 MG TABLET    Take 40 mg by mouth daily.   HYDROCODONE-ACETAMINOPHEN (NORCO/VICODIN) 5-325 MG TABLET    Take 1-2 tablets by mouth every 4 (four) hours as needed for moderate pain or severe pain.   LANTUS 100 UNIT/ML INJECTION    Take 10 Units by mouth daily.   METFORMIN (GLUCOPHAGE) 1000 MG TABLET    Take 1 tablet by mouth 2 (two) times daily.   METOPROLOL (LOPRESSOR) 50 MG TABLET    Take 1 tablet (50 mg total) by mouth 2 (two) times daily.   NITROGLYCERIN (NITROSTAT) 0.4 MG SL TABLET    Place 1 tablet (0.4 mg total) under the tongue every 5 (five) minutes as needed for chest pain.   ONDANSETRON (ZOFRAN ODT) 4 MG DISINTEGRATING TABLET    Take 1 tablet (4 mg total) by mouth every 8 (eight) hours as needed for nausea or vomiting.   PIOGLITAZONE (ACTOS) 30 MG TABLET    Take 1 tablet by mouth daily.   POLYETHYLENE GLYCOL (MIRALAX / GLYCOLAX) PACKET    Take 17 g by mouth daily.   RAMIPRIL (ALTACE) 10 MG CAPSULE    Take 1 capsule by mouth daily.   RIVAROXABAN (XARELTO) 10 MG TABS TABLET    Take 1 tablet (10 mg total) by mouth daily.    SIMVASTATIN (ZOCOR) 40 MG TABLET    Take 40 mg by mouth at bedtime.   VITAMIN B-12 (CYANOCOBALAMIN) 1000 MCG TABLET    Take 1,000 mcg by mouth daily.  Modified Medications   No medications on file  Discontinued Medications   HYDROCODONE-ACETAMINOPHEN (NORCO/VICODIN) 5-325 MG TABLET    Every 4 hours as needed for breakthrough pain     Physical Exam: Filed Vitals:   06/15/16 0938  BP: 124/64  Pulse: 67  Temp: 97.7 F (36.5 C)  TempSrc: Oral  Resp: 20  Height: 5\' 8"  (1.727 m)  Weight: 195 lb (88.451 kg)    Physical Exam  Constitutional: She is oriented to person, place, and time. She appears well-developed and well-nourished.  HENT:  Mouth/Throat: Oropharynx is clear and moist. No oropharyngeal exudate.  Eyes:  Pupils are equal, round, and reactive to light. No scleral icterus.  Neck: Neck supple. Carotid bruit is not present. No tracheal deviation present. No thyromegaly present.  Cardiovascular: Normal rate, regular rhythm and intact distal pulses.  Exam reveals no gallop and no friction rub.   Murmur (1/6 SEM) heard. R>L LE edema. No calf TTP on left. Right leg wrapped  Pulmonary/Chest: Effort normal and breath sounds normal. No stridor.  Abdominal: Soft. Bowel sounds are normal. She exhibits no distension and no mass. There is no hepatomegaly. There is no tenderness. There is no rebound and no guarding.  Musculoskeletal: She exhibits tenderness. She exhibits no edema.  Lymphadenopathy:    She has no cervical adenopathy.  Neurological: She is alert and oriented to person, place, and time.  Skin: Skin is warm and dry. No rash noted.  Psychiatric: She has a normal mood and affect. Her behavior is normal. Thought content normal.    Labs reviewed: Basic Metabolic Panel:  Recent Labs  07/19/15 1015  07/21/15 0547  09/17/15 0931  05/25/16 2006 05/25/16 2014 05/27/16 0016 05/27/16 0935  NA  --   < >  --   < > 138  < > 137 140  --  138  K  --   < >  --   < > 4.2  < > 3.2*  3.2*  --  4.4  CL  --   < >  --   < > 104  --  101 101  --  107  CO2  --   < >  --   < > 25  --  26  --   --  25  GLUCOSE  --   < >  --   < > 112*  < > 157* 152*  --  194*  BUN  --   < >  --   < > 17  --  17 18  --  19  CREATININE  --   < >  --   < > 0.67  --  0.81 0.70  --  0.69  CALCIUM  --   < >  --   < > 9.5  --  8.4*  --   --  7.9*  MG 1.3*  --  1.6*  --   --   --   --   --  2.0  --   < > = values in this interval not displayed. Liver Function Tests:  Recent Labs  06/28/15 1150 07/15/15 1012 09/17/15 0931  AST 13* 25 19  ALT 12* 16 13*  ALKPHOS 73 91 71  BILITOT 0.8 1.2 0.6  PROT 7.1 7.7 6.6  ALBUMIN 3.4* 3.4* 3.4*    Recent Labs  06/28/15 1150  LIPASE 19*   No results for input(s): AMMONIA in the last 8760 hours. CBC:  Recent Labs  07/18/15 1514 07/19/15 0229 09/17/15 0931 09/17/15 1425 05/25/16 2006 05/25/16 2014  WBC 8.0 6.5 7.0  --  8.8  --   NEUTROABS 5.6  --  4.6  --  7.7  --   HGB 14.4 12.8 12.5 11.2* 13.2 14.3  HCT 43.0 38.4 39.0 33.0* 41.1 42.0  MCV 91.1 90.6 95.6  --  94.7  --   PLT 176 175 202  --  182  --    TSH:  Recent Labs  07/21/15 1138 05/27/16 0016  TSH 0.414 0.862   A1C: Lab Results  Component Value Date   HGBA1C 5.3 07/18/2015  Lipid Panel: No results for input(s): CHOL, HDL, LDLCALC, TRIG, CHOLHDL, LDLDIRECT in the last 8760 hours.  Radiological Exams: Dg Chest 1 View  05/25/2016  CLINICAL DATA:  Fall at home this evening. EXAM: CHEST 1 VIEW COMPARISON:  09/17/2015 FINDINGS: The lungs are clear wiithout focal pneumonia, edema, pneumothorax or pleural effusion. The cardio pericardial silhouette is enlarged. Patient is status post multilevel thoracic vertebral augmentation. IMPRESSION: No acute cardiopulmonary findings. Electronically Signed   By: Misty Stanley M.D.   On: 05/25/2016 21:05   Dg Tibia/fibula Right  05/27/2016  CLINICAL DATA:  ORIF right tibia. EXAM: RIGHT TIBIA AND FIBULA - 2 VIEW COMPARISON:  May 25, 2016  FINDINGS: The proximal fibular fracture is again identified. A rod has been placed across the comminuted distal tibial fracture. Alignment is improved in the interval. Soft tissue swelling is identified. The rod is affixed proximally and distally with screws. IMPRESSION: Tibial fracture repair as above. Electronically Signed   By: Dorise Bullion III M.D   On: 05/27/2016 00:43   Dg Tibia/fibula Right  05/26/2016  CLINICAL DATA:  Right tibial ORIF EXAM: DG C-ARM 61-120 MIN; RIGHT TIBIA AND FIBULA - 2 VIEW COMPARISON:  None FLUOROSCOPY TIME:  1 minutes 44 seconds FINDINGS: Comminuted distal tibial metaphysis fracture transfixed with an intra medullary nail and interlocking screws in near anatomic alignment. Mildly comminuted and displaced oblique fracture of the proximal fibular diaphysis. IMPRESSION: ORIF right distal tibial fracture. Mildly comminuted and displaced oblique fracture of the proximal fibular diaphysis. Electronically Signed   By: Kathreen Devoid   On: 05/26/2016 19:45   Dg Tibia/fibula Right  05/25/2016  CLINICAL DATA:  [place] at home this evening.  Initial encounter. EXAM: RIGHT TIBIA AND FIBULA - 2 VIEW COMPARISON:  None. FINDINGS: Comminuted fracture proximal fibula is assisted with a comminuted fracture the distal tibia. Bones are demineralized. IMPRESSION: Comminuted fractures of the proximal fibula and distal tibia. Electronically Signed   By: Misty Stanley M.D.   On: 05/25/2016 21:02   Dg Ankle 2 Views Right  05/25/2016  CLINICAL DATA:  Fall at home.  Initial encounter. EXAM: RIGHT ANKLE - 2 VIEW COMPARISON:  None. FINDINGS: Two-view exam of the right ankle shows a comminuted distal tibia fracture without obvious extension into the ankle mortise. Mild apex medial angulation evident. Bones are diffusely demineralized. IMPRESSION: Comminuted slightly angulated fracture the distal tibia. Electronically Signed   By: Misty Stanley M.D.   On: 05/25/2016 21:03   Ct Head Wo Contrast  05/26/2016   CLINICAL DATA:  70 year old female with fall EXAM: CT HEAD WITHOUT CONTRAST TECHNIQUE: Contiguous axial images were obtained from the base of the skull through the vertex without intravenous contrast. COMPARISON:  Head CT dated 07/18/2015 FINDINGS: There is mild age-related atrophy and chronic microvascular ischemic changes. No acute intracranial hemorrhage. No mass effect or midline shift. The visualized paranasal sinuses and mastoid air cells are clear. The calvarium is intact. IMPRESSION: No acute intracranial hemorrhage. Electronically Signed   By: Anner Crete M.D.   On: 05/26/2016 00:07   Ct Tibia Fibula Right Wo Contrast  05/26/2016  CLINICAL DATA:  70 year old female with fall and right lower extremity pain. EXAM: CT OF THE RIGHT TIBIA FIBULA WITHOUT CONTRAST TECHNIQUE: Multidetector CT imaging was performed according to the standard protocol. Multiplanar CT image reconstructions were also generated. COMPARISON:  Radiograph dated 05/25/2016 FINDINGS: There is a comminuted fracture of the proximal fibula with approximately 6 mm lateral displacement of the distal fracture fragment. There is  also a comminuted nondisplaced fracture of the distal tibia. There is advanced osteopenia which limits evaluation for fracture. A hairline linear lucency through the tibial plafond (series 2, image 159) likely represents a vascular groove or related to osteopenia. Intra-articular extension of fracture is less likely. There is however irregularity of the lateral aspect of the distal tibia at the anterior tibiofibular syndesmosis (series 2, image 160 and coronal series 4, image 41- 45). Small bone fragment noted adjacent to the anterior corner of the distal tibiotalar syndesmosis. This may represent a fracture or degenerative changes. There is no dislocation. There is no joint effusion. There is mild soft tissue edema in the distal calf. No large hematoma. IMPRESSION: Comminuted fractures of the proximal fibula and  distal tibia. Irregularity of the anterior aspect of the distal tibiofibular syndesmosis with small adjacent bone fragment. This may be chronic or represent acute fracture. Clinical correlation is recommended. No dislocation. Advanced osteopenia. Electronically Signed   By: Anner Crete M.D.   On: 05/26/2016 00:50   Dg C-arm 61-120 Min  05/26/2016  CLINICAL DATA:  Right tibial ORIF EXAM: DG C-ARM 61-120 MIN; RIGHT TIBIA AND FIBULA - 2 VIEW COMPARISON:  None FLUOROSCOPY TIME:  1 minutes 44 seconds FINDINGS: Comminuted distal tibial metaphysis fracture transfixed with an intra medullary nail and interlocking screws in near anatomic alignment. Mildly comminuted and displaced oblique fracture of the proximal fibular diaphysis. IMPRESSION: ORIF right distal tibial fracture. Mildly comminuted and displaced oblique fracture of the proximal fibular diaphysis. Electronically Signed   By: Kathreen Devoid   On: 05/26/2016 19:45    Assessment/Plan 1. Tibia fracture, right, closed, with routine healing, subsequent encounte S/P ORIF (open reduction internal fixation) fracture -pain in well controlled at this time, not using pain medication at this time. NWB for 3 weeks, has follow up with ortho scheduled  -conts on xarelto for DVT prophylaxis   2. Constipation, unspecified constipation type Ongoing issue, conts on miralax and colace  3. Type 2 diabetes mellitus with retinopathy, with long-term current use of insulin, macular edema presence unspecified, unspecified laterality, unspecified retinopathy severity ([place]) -cont on lantus and metformin and actos, followed by PCP  5. Essential hypertension Blood pressure remains stable, cont current regimen  6. Paroxysmal SVT (supraventricular tachycardia) ([facility]) -rate controlled at this time. conts on metoprolol   pt is stable for discharge-will need PT/OT/SW r home health. DME needed includes WC which was written.  will need to follow up with PCP within 2 weeks.     Carlos American. Harle Battiest  [facility] [phone] 8 am - 5 pm) [phone] (after hours)

## 2016-06-15 NOTE — Addendum Note (Signed)
Addended by: Lauree Chandler on: 06/15/2016 01:33 PM   Modules accepted: Level of Service

## 2016-09-29 ENCOUNTER — Other Ambulatory Visit: Payer: Self-pay | Admitting: Cardiology

## 2016-09-29 DIAGNOSIS — I2510 Atherosclerotic heart disease of native coronary artery without angina pectoris: Secondary | ICD-10-CM

## 2016-09-29 NOTE — Telephone Encounter (Signed)
Rx(s) sent to pharmacy electronically.  

## 2017-02-21 IMAGING — DX DG THORACIC SPINE 2V
2 series · 2 of 2 positions shown · non-contrast
Comparison: MRI 12/12/2012

ADDENDUM:
Two views of the thoracic spine were obtained.
CLINICAL DATA: Back pain, onset 4 days ago.

EXAM:
THORACIC SPINE - 2-3 VIEWS

[t-spine ap]
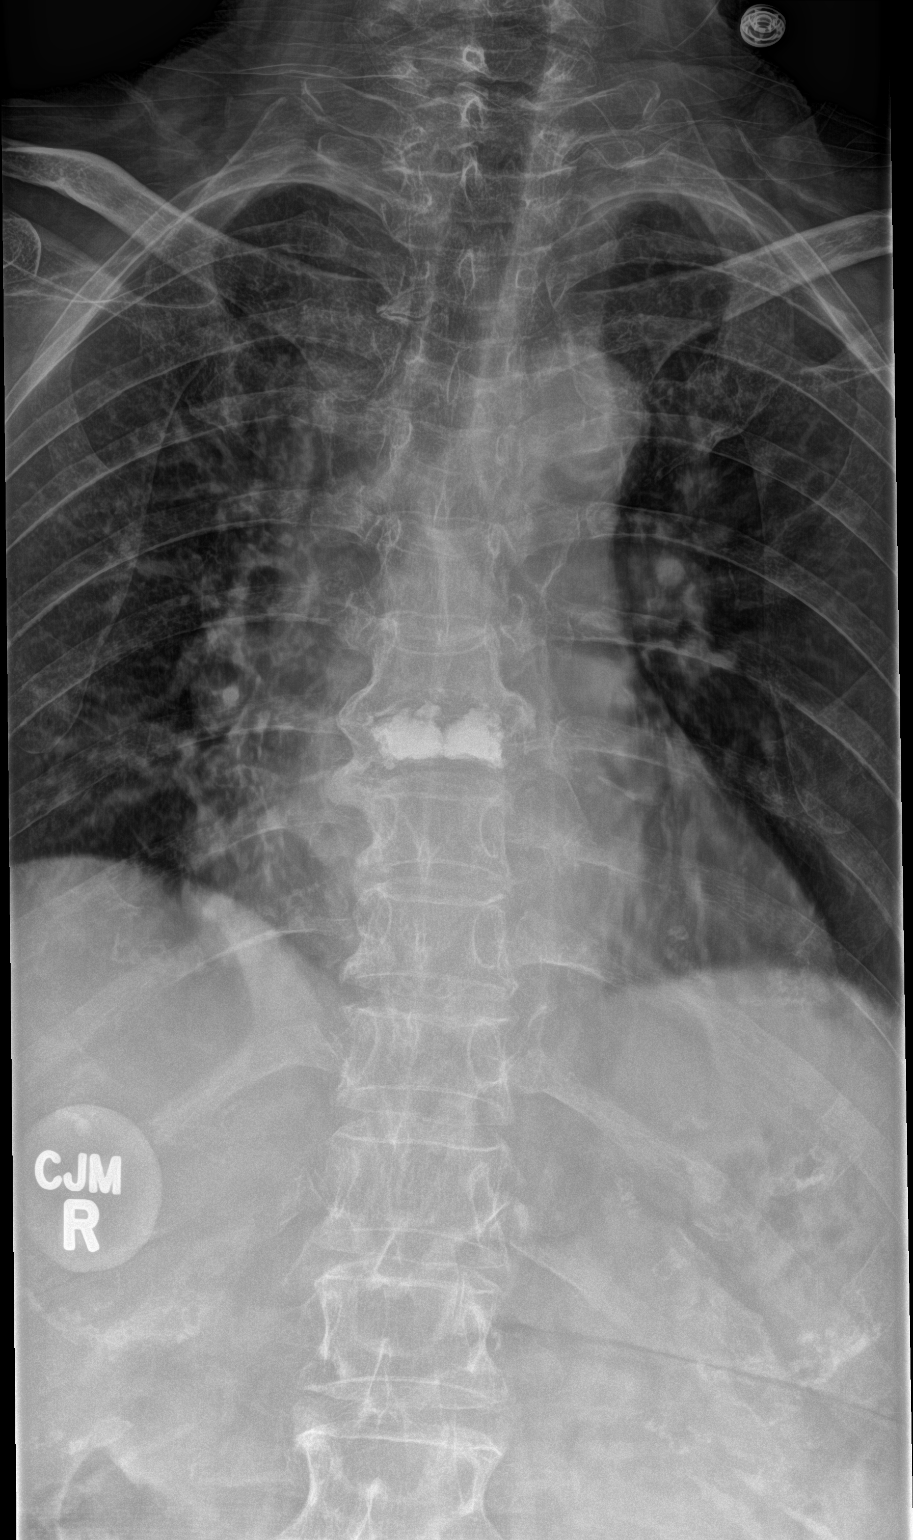

[t-spine lat]
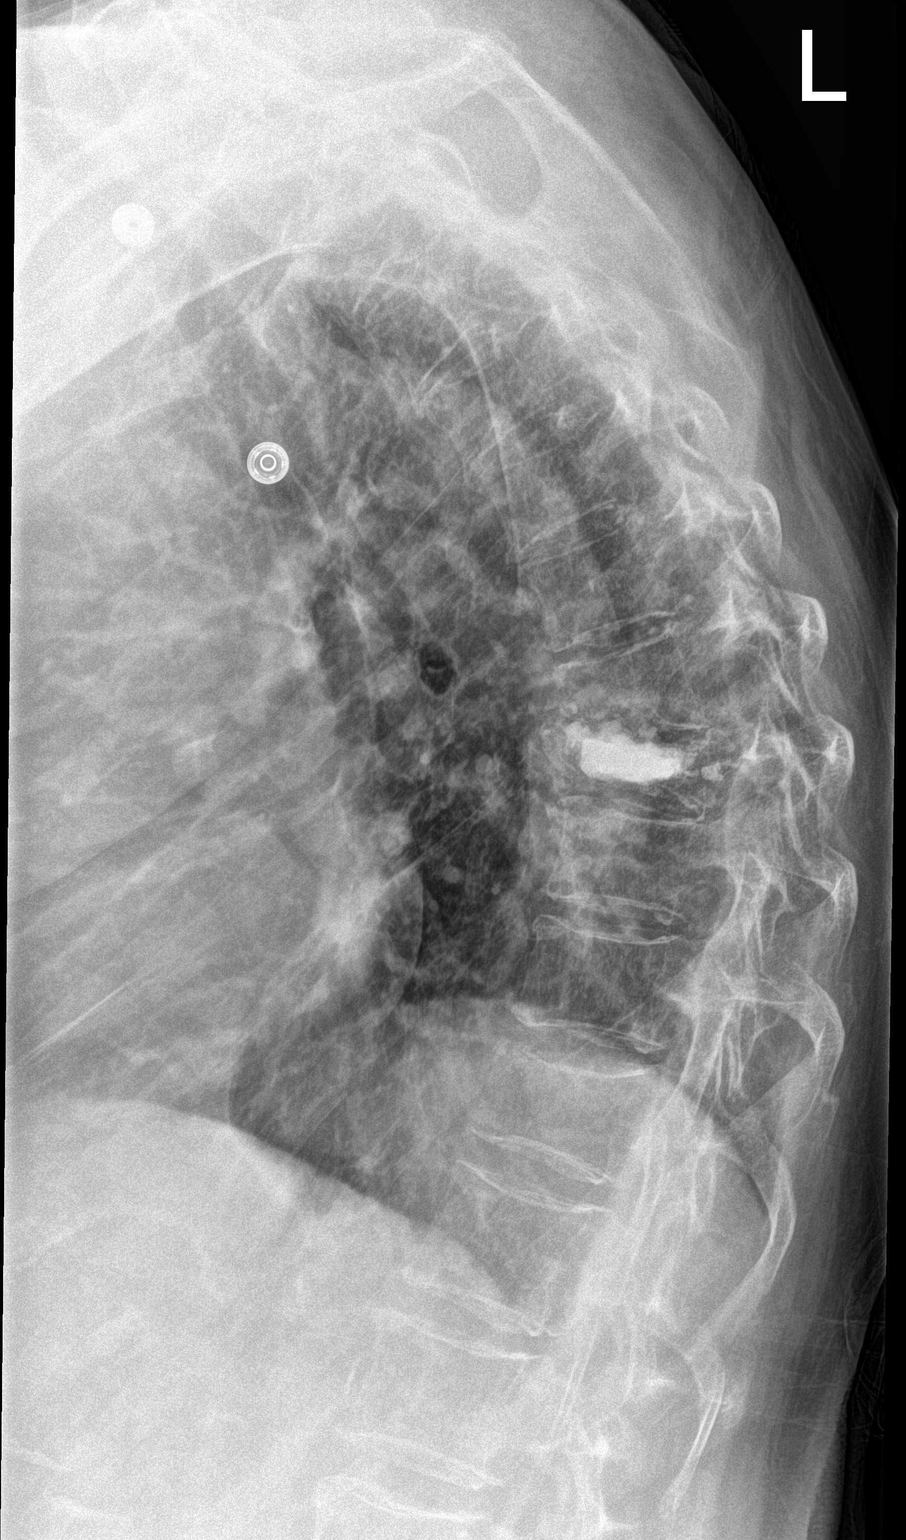

[2 of 2 positions shown; findings below may reference images not displayed]

FINDINGS: There is T8 vertebroplasty, with no progressive loss of height since
12/12/2012. There is moderate anterior wedging of T7 which is new
from 12/12/2012 but not necessarily acute. There is minimal anterior
wedging of T11, also new from 12/12/2012 but not necessarily acute.
No bone lesion or bony destruction is evident. There is moderate
kyphosis centered at T8.
IMPRESSION: Kyphosis with multiple vertebral compressions, age indeterminate.
The greatest interval change from 12/12/2012 is at T7 where there is
greater than 50% loss of height anteriorly.

## 2017-02-27 IMAGING — CR DG CHEST 1V
1 series · 1 of 1 positions shown · non-contrast
Comparison: 11/14/2014

CLINICAL DATA: Vomiting, lower abdominal pain.

EXAM:
CHEST  1 VIEW

[chest ap]
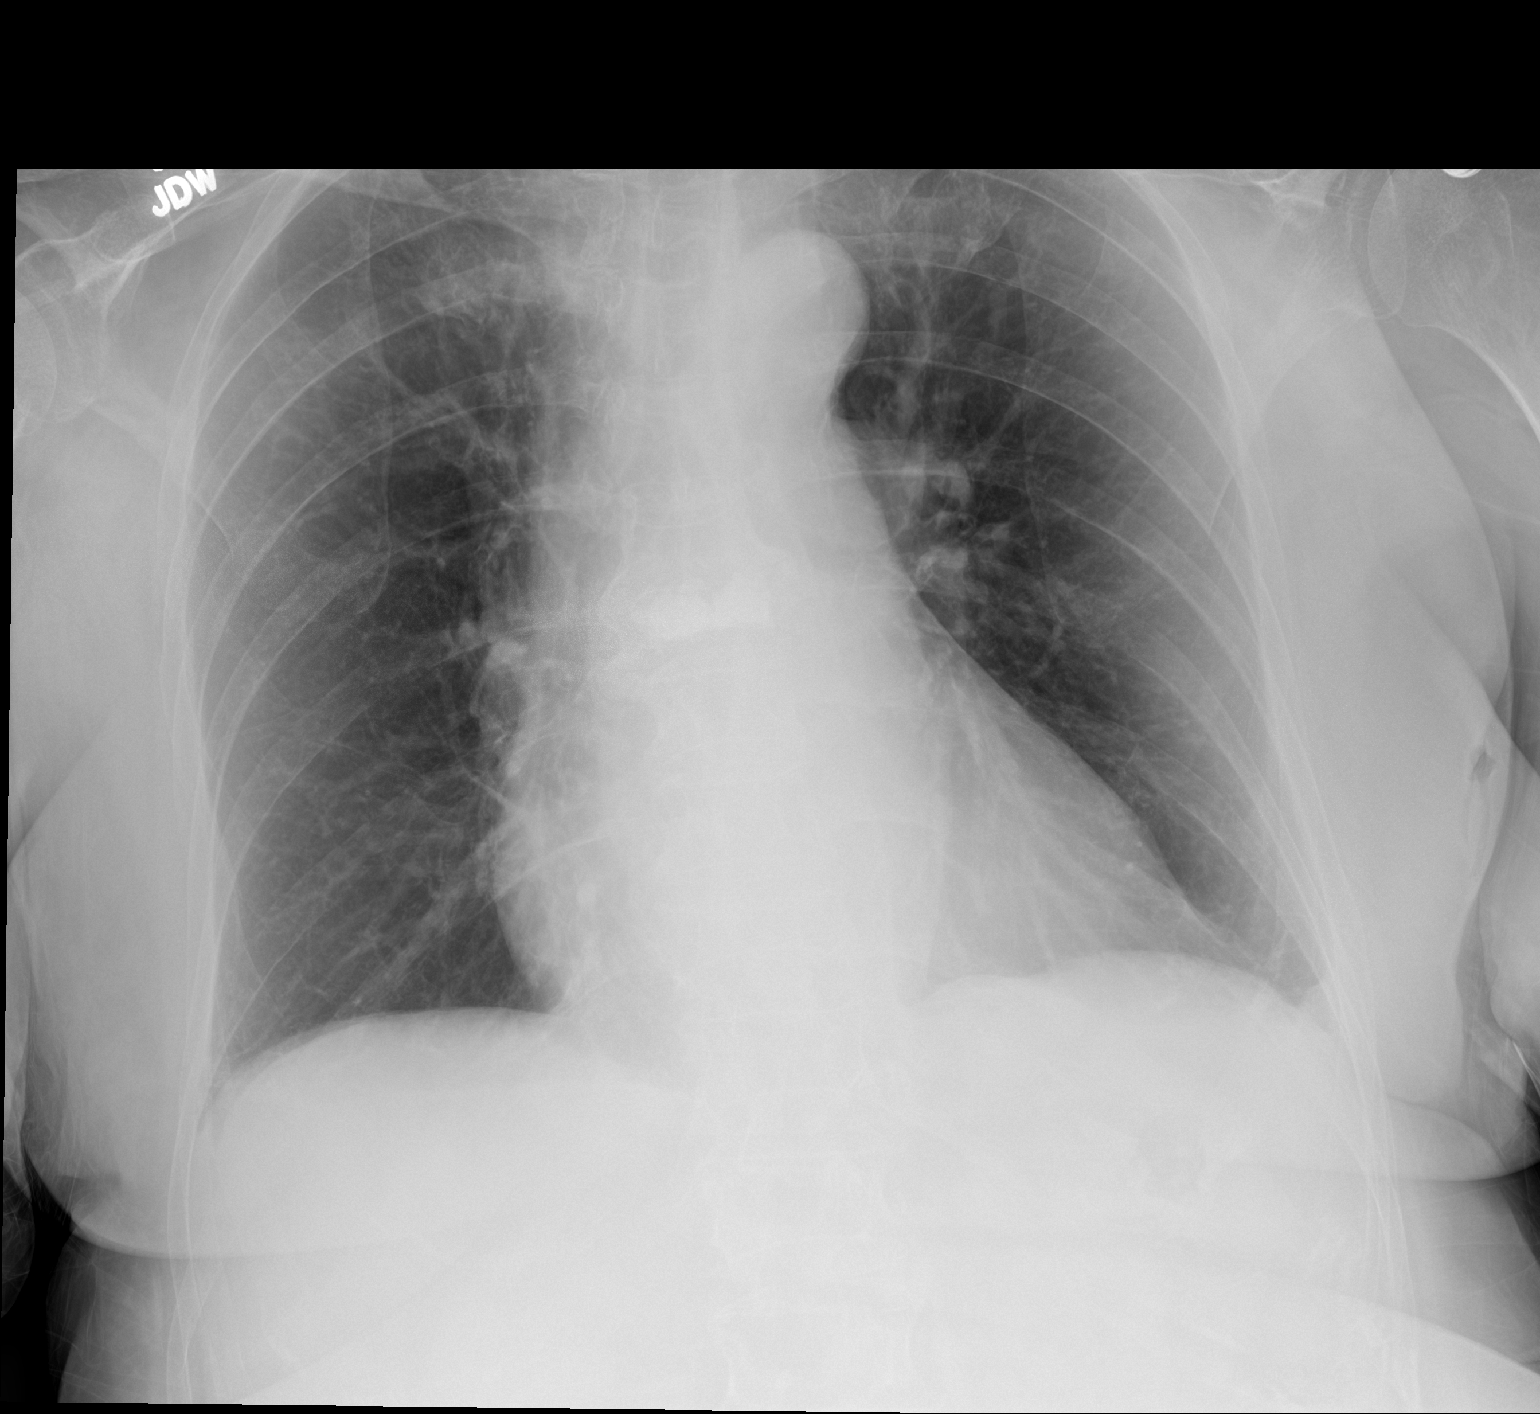

[1 of 1 positions shown; findings below may reference images not displayed]

FINDINGS: Mild hyperinflation of the lungs. Mild cardiomegaly. Lungs are
clear. No effusions. No acute bony abnormality.
IMPRESSION: Mild hyperinflation and cardiomegaly.  No active disease.

## 2017-09-17 ENCOUNTER — Encounter (HOSPITAL_COMMUNITY): Payer: Self-pay | Admitting: Oncology

## 2017-09-17 ENCOUNTER — Emergency Department (HOSPITAL_COMMUNITY)
Admission: EM | Admit: 2017-09-17 | Discharge: 2017-09-18 | Disposition: A | Discharge status: Home / Self Care | Payer: Medicare Other | Attending: Emergency Medicine | Admitting: Emergency Medicine

## 2017-09-17 DIAGNOSIS — N390 Urinary tract infection, site not specified: Secondary | ICD-10-CM | POA: Insufficient documentation

## 2017-09-17 DIAGNOSIS — E119 Type 2 diabetes mellitus without complications: Secondary | ICD-10-CM | POA: Insufficient documentation

## 2017-09-17 DIAGNOSIS — R600 Localized edema: Secondary | ICD-10-CM | POA: Insufficient documentation

## 2017-09-17 DIAGNOSIS — Z7984 Long term (current) use of oral hypoglycemic drugs: Secondary | ICD-10-CM | POA: Diagnosis not present

## 2017-09-17 DIAGNOSIS — R531 Weakness: Secondary | ICD-10-CM | POA: Diagnosis present

## 2017-09-17 DIAGNOSIS — I2510 Atherosclerotic heart disease of native coronary artery without angina pectoris: Secondary | ICD-10-CM | POA: Insufficient documentation

## 2017-09-17 DIAGNOSIS — I10 Essential (primary) hypertension: Secondary | ICD-10-CM | POA: Insufficient documentation

## 2017-09-17 DIAGNOSIS — Z79899 Other long term (current) drug therapy: Secondary | ICD-10-CM | POA: Diagnosis not present

## 2017-09-17 NOTE — ED Notes (Signed)
Pt reports sitting on the commode and unable to get off for an hour and a half.

## 2017-09-17 NOTE — ED Provider Notes (Signed)
[place] EMERGENCY DEPARTMENT Provider Note   CSN: [id number] Arrival date & time: 09/17/17  2237     History   Chief Complaint Chief Complaint  Patient presents with  . Weakness    HPI [name] is a 71 y.o. female.  Patient brought in by EMS for generalized weakness.  States she was sitting on the commode for about an hour and a half and was unable to get up because of generalized weakness in her arms and legs.  She was able to scoot off the toilet and get to the living room and called an ambulance.  She reports she was feeling well prior to going to the bathroom.  She believes she was unable to get up because she was using a different toilet that does not have the arms on it that she is used to.  She denies any preceding dizziness or lightheadedness.  No nausea or vomiting.  No diarrhea.  No chest pain or shortness of breath.  EMS reports she was having episodes of sinus tachycardia possibly atrial fibrillation in route.  No history of the same.  Patient with a history of CAD with stent, diabetes, high cholesterol and hypertension.  The patient denies any pain.  She states compliance with her medications.  She has been eating and drinking well.   The history is provided by the patient and the EMS personnel.  Weakness  Pertinent negatives include no shortness of breath, no chest pain and no vomiting.    Past Medical History:  Diagnosis Date  . Anxiety    Due to current back pain and has to lie flat.  . Arthritis   . CAD (coronary artery disease)    a. NSTEMI in setting of UTI and SVT 12/2012 => LHC 01/08/13: Proximal LAD 30%, ostial diagonal 30-40%, proximal RCA 95%, inferior AK, EF 45-50% => PCI: Promus DES x 2 to RCA;  b. Echo 01/05/13: EF 40-08%, grade 1 diastolic dysfunction, MAC   . Compression fracture    thoracic 12  . Edema   . History of blood transfusion   . Hypercholesteremia   . Hypertension   . Myocardial infarction ([place]) 2014  . Rectal  ulceration    colonoscopy 12/2012  . SVT (supraventricular tachycardia) ([place])   . Type II diabetes mellitus [facility])     Patient Active Problem List   Diagnosis Date Noted  . Adjustment disorder with anxious mood 05/31/2016  . Tibia fracture after fall-s/p intramedulaary nailing 05/26/16 05/26/2016  . 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 Orthostatic hypotension   . Syncope and collapse 07/18/2015  . Compression fracture 07/15/2015  . Sepsis ([place]) 12/05/2014  . Constipation 12/04/2014  . Hematuria   . Intertrochanteric fracture of left hip ([place]) 11/14/2014  . Type 2 diabetes mellitus without complication ([place])   . 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 Contusion of right leg 11/13/2013  . HTN (hypertension) 11/12/2013  . Fall 11/12/2013  . CAD -S/P PCI Feb 2014 01/11/2013  . Oral thrush 01/11/2013  . Hematochezia 01/08/2013  . PSVT-post op 05/26/16 01/05/2013  . History of NSTEMI Feb 2014 01/05/2013  . Hypokalemia 01/04/2013  . Fever 01/04/2013  . H/O vertebroplasty 01/04/2013  . Proliferative diabetic retinopathy ([place]) 08/15/2012  . Pseudoaphakia 08/15/2012  . Cellulitis and abscess of foot 03/31/2012  . Dehydration 03/31/2012  . Hyponatremia 03/31/2012    Past Surgical History:  Procedure Laterality Date  . CATARACT EXTRACTION, BILATERAL Bilateral   . COLONOSCOPY Left 01/12/2013   Procedure:  COLONOSCOPY;  Surgeon: Wonda Horner, MD;  Location: [facility] ENDOSCOPY;  Service: Endoscopy;  Laterality: Left;  . COLONOSCOPY N/A 01/14/2013   Procedure: COLONOSCOPY;  Surgeon: Wonda Horner, MD;  Location: [facility] ENDOSCOPY;  Service: Endoscopy;  Laterality: N/A;  Rm 2034, attemped Colon on 2-15=inadequate prep  . CORONARY ANGIOPLASTY  12/2012  . EYE SURGERY Bilateral "many"  . FRACTURE SURGERY    . KYPHOPLASTY  12/27/2012   Procedure: KYPHOPLASTY;  Surgeon: Sinclair Ship, MD;  Location: [place];  Service: Orthopedics;  Laterality: Bilateral;  T-8 kyphoplasty  . KYPHOPLASTY N/A 07/15/2015    Procedure: T11 KYPHOPLASTY;  Surgeon: Phylliss Bob, MD;  Location: [place];  Service: Orthopedics;  Laterality: N/A;  T11 kyphoplasty  . KYPHOPLASTY N/A 09/17/2015   Procedure: T12 kyphoplasty;  Surgeon: Phylliss Bob, MD;  Location: [place];  Service: Orthopedics;  Laterality: N/A;  T12 kyphoplasty  . LEFT HEART CATHETERIZATION WITH CORONARY ANGIOGRAM N/A 01/08/2013   Procedure: LEFT HEART CATHETERIZATION WITH CORONARY ANGIOGRAM;  Surgeon: Thayer Headings, MD;  Location: [facility] CATH LAB;  Service: Cardiovascular;  Laterality: N/A;  . ORIF HIP FRACTURE Left 11/15/2014   Procedure: OPEN REDUCTION INTERNAL FIXATION HIP INTERTROCH;  Surgeon: Mcarthur Rossetti, MD;  Location: [place];  Service: Orthopedics;  Laterality: Left;  . PERCUTANEOUS STENT INTERVENTION N/A 01/15/2013   Procedure: PERCUTANEOUS STENT INTERVENTION;  Surgeon: Peter M Martinique, MD;  Location: [facility] CATH LAB;  Service: Cardiovascular;  Laterality: N/A;  . TIBIA IM NAIL INSERTION Right 05/26/2016   Procedure: INTRAMEDULLARY (IM) NAIL TIBIAL;  Surgeon: Meredith Pel, MD;  Location: [place];  Service: Orthopedics;  Laterality: Right;  . TUBAL LIGATION      OB History    No data available       Home Medications    Prior to Admission medications   Medication Sig Start Date End Date Taking? Authorizing Provider  docusate sodium (COLACE) 100 MG capsule Take 1 capsule (100 mg total) by mouth daily as needed for mild constipation. 06/22/15   Pamella Pert, MD  furosemide (LASIX) 40 MG tablet Take 40 mg by mouth daily.    [provider]  HYDROcodone-acetaminophen (NORCO/VICODIN) 5-325 MG tablet Take 1-2 tablets by mouth every 4 (four) hours as needed for moderate pain or severe pain.    [provider]  LANTUS 100 UNIT/ML injection Take 10 Units by mouth daily. 08/06/15   [provider]  metFORMIN (GLUCOPHAGE) 1000 MG tablet Take 1 tablet by mouth 2 (two) times daily. 08/26/15   [provider]    metoprolol (LOPRESSOR) 50 MG tablet Take 1 tablet (50 mg total) by mouth 2 (two) times daily. PLEASE CONTACT OFFICE FOR ADDITIONAL REFILLS 09/29/16   Minus Breeding, MD  nitroGLYCERIN (NITROSTAT) 0.4 MG SL tablet Place 1 tablet (0.4 mg total) under the tongue every 5 (five) minutes as needed for chest pain. 04/16/13   Richardson Dopp T, PA-C  ondansetron (ZOFRAN ODT) 4 MG disintegrating tablet Take 1 tablet (4 mg total) by mouth every 8 (eight) hours as needed for nausea or vomiting. 06/28/15   Jule Ser, DO  pioglitazone (ACTOS) 30 MG tablet Take 1 tablet by mouth daily. 09/08/15   [provider]  polyethylene glycol (MIRALAX / GLYCOLAX) packet Take 17 g by mouth daily.    [provider]  ramipril (ALTACE) 10 MG capsule Take 1 capsule by mouth daily. 08/26/15   [provider]  rivaroxaban (XARELTO) 10 MG TABS tablet Take 1 tablet (10 mg  total) by mouth daily. 05/27/16   Meredith Pel, MD  simvastatin (ZOCOR) 40 MG tablet Take 40 mg by mouth at bedtime.    [provider]  vitamin B-12 (CYANOCOBALAMIN) 1000 MCG tablet Take 1,000 mcg by mouth daily.    [provider]    Family History Family History  Problem Relation Age of Onset  . Hypertension Brother     Social History Social History  Substance Use Topics  . Smoking status: Never Smoker  . Smokeless tobacco: Never Used  . Alcohol use No     Allergies   Oxycodone   Review of Systems Review of Systems  Constitutional: Negative for activity change, appetite change, fatigue and fever.  Eyes: Negative for visual disturbance.  Respiratory: Negative for cough, chest tightness and shortness of breath.   Cardiovascular: Positive for leg swelling. Negative for chest pain.  Gastrointestinal: Negative for abdominal pain and vomiting.  Genitourinary: Negative for dysuria, pelvic pain, vaginal bleeding and vaginal discharge.  Musculoskeletal: Negative for arthralgias and myalgias.   Skin: Negative for rash.  Neurological: Positive for weakness and light-headedness.    all other systems are negative except as noted in the HPI and PMH.    Physical Exam Updated Vital Signs BP (!) 146/80   Pulse 91   Temp 98.2 F (36.8 C) (Oral)   Resp (!) 21   SpO2 97%   Physical Exam  Constitutional: She is oriented to person, place, and time. She appears well-developed and well-nourished. No distress.  Make up done   HENT:  Head: Normocephalic and atraumatic.  Mouth/Throat: Oropharynx is clear and moist. No oropharyngeal exudate.  Eyes: Pupils are equal, round, and reactive to light. Conjunctivae and EOM are normal.  Neck: Normal range of motion. Neck supple.  No meningismus.  Cardiovascular: Normal rate, regular rhythm, normal heart sounds and intact distal pulses.   No murmur heard. Pulmonary/Chest: Effort normal and breath sounds normal. No respiratory distress. She exhibits no tenderness.  Abdominal: Soft. There is no tenderness. There is no rebound and no guarding.  Musculoskeletal: Normal range of motion. She exhibits edema. She exhibits no tenderness.  +2 edema to knees  Neurological: She is alert and oriented to person, place, and time. No cranial nerve deficit. She exhibits normal muscle tone. Coordination normal.   5/5 strength throughout. CN 2-12 intact.Equal grip strength. No ataxia on finger to nose. No pronator drift.   Skin: Skin is warm.  Psychiatric: She has a normal mood and affect. Her behavior is normal.  Nursing note and vitals reviewed.    ED Treatments / Results  Labs (all labs ordered are listed, but only abnormal results are displayed) Labs Reviewed  COMPREHENSIVE METABOLIC PANEL - Abnormal; Notable for the following:       Result Value   Glucose, Bld 112 (*)    Calcium 8.8 (*)    Albumin 3.3 (*)    All other components within normal limits  BRAIN NATRIURETIC PEPTIDE - Abnormal; Notable for the following:    B Natriuretic Peptide 220.6  (*)    All other components within normal limits  URINALYSIS, ROUTINE W REFLEX MICROSCOPIC - Abnormal; Notable for the following:    Color, Urine STRAW (*)    Specific Gravity, Urine 1.003 (*)    Leukocytes, UA LARGE (*)    Bacteria, UA MANY (*)    Squamous Epithelial / LPF 0-5 (*)    All other components within normal limits  URINE CULTURE  CBC WITH DIFFERENTIAL/PLATELET  TROPONIN I  PROTIME-INR  TROPONIN I    EKG  EKG Interpretation  Date/Time:  Sunday September 17 2017 23:06:42 EDT Ventricular Rate:  86 PR Interval:    QRS Duration: 91 QT Interval:  380 QTC Calculation: 455 R Axis:   -14 Text Interpretation:  Sinus rhythm Probable left atrial enlargement Abnormal R-wave progression, early transition No significant change was found Confirmed by Ezequiel Essex [phone]) on 09/17/2017 11:39:29 PM       Radiology Dg Chest 2 View  Result Date: 09/18/2017 CLINICAL DATA:  Acute onset of generalized leg weakness. Arrhythmia. Initial encounter. EXAM: CHEST  2 VIEW COMPARISON:  Chest radiograph performed 05/25/2016 FINDINGS: The lungs are well-aerated and clear. There is no evidence of focal opacification, pleural effusion or pneumothorax. The heart is mildly enlarged. No acute osseous abnormalities are seen. The patient is status post vertebroplasty along the mid and lower thoracic spine. IMPRESSION: Mild cardiomegaly.  Lungs remain grossly clear. Electronically Signed   By: Garald Balding M.D.   On: 09/18/2017 01:32   Ct Head Wo Contrast  Result Date: 09/18/2017 CLINICAL DATA:  72 y/o  F; altered level of consciousness. EXAM: CT HEAD WITHOUT CONTRAST TECHNIQUE: Contiguous axial images were obtained from the base of the skull through the vertex without intravenous contrast. COMPARISON:  05/26/2016 CT of the head. FINDINGS: Brain: No evidence of acute infarction, hemorrhage, hydrocephalus, extra-axial collection or mass lesion/mass effect. Stable mild chronic microvascular ischemic  changes and parenchymal volume loss of the brain. Incidental partially empty sella turcica. Vascular: Calcific atherosclerosis of carotid siphons. No hyperdense vessel identified. Skull: Normal. Negative for fracture or focal lesion. Sinuses/Orbits: No acute finding. Other: Bilateral intra-ocular lens replacement. IMPRESSION: 1. No acute intracranial abnormality identified. 2. Stable mild chronic microvascular ischemic changes and parenchymal volume loss of the brain. Electronically Signed   By: Kristine Garbe M.D.   On: 09/18/2017 00:26    Procedures Procedures (including critical care time)  Medications Ordered in ED Medications - No data to display   Initial Impression / Assessment and Plan / ED Course  I have reviewed the triage vital signs and the nursing notes.  Pertinent labs & imaging results that were available during my care of the patient were reviewed by me and considered in my medical decision making (see chart for details).    Generalized weakness with inability to get Off the commode.  Denies any dizziness or lightheadedness.  No chest pain or shortness of breath.  On arrival she is in no distress.  EKG is sinus rhythm.  Her neurological exam is nonfocal.  She denies any fall, syncope or pain.  Labs are reassuring.  Patient given IV fluids. Orthostatics positive.  Workup consistent with urinary tract infection.  Culture sent.  Patient is tolerating p.o. and able to ambulate without assistance.  Will treat UTI.  Troponin negative x2. Patient feels well. She is tolerating PO and ambulatory. Follow up with PCP. Return precautions discussed.  Final Clinical Impressions(s) / ED Diagnoses   Final diagnoses:  Generalized weakness  Urinary tract infection without hematuria, site unspecified    New Prescriptions New Prescriptions   No medications on file     Ezequiel Essex, MD 09/18/17 [phone]

## 2017-09-17 NOTE — ED Triage Notes (Signed)
Pt bib [facility] from home.  Per EMS pt was having a BM and her legs became weak/was unable to stand.  Pt got to her phone and called EMS.  En route pt was having arrhythmias.  Pt reported a. Fib to EMS.  Pt was hesitant to come to ED.  Per EMS pt is to take lasix however d/t appearance of pt's legs, question compliance.

## 2017-09-18 ENCOUNTER — Emergency Department (HOSPITAL_COMMUNITY): Payer: Medicare Other

## 2017-09-18 LAB — URINALYSIS, ROUTINE W REFLEX MICROSCOPIC
BILIRUBIN URINE: NEGATIVE
Glucose, UA: NEGATIVE mg/dL
Hgb urine dipstick: NEGATIVE
Ketones, ur: NEGATIVE mg/dL
Nitrite: NEGATIVE
PH: 7 (ref 5.0–8.0)
Protein, ur: NEGATIVE mg/dL
Specific Gravity, Urine: 1.003 — ABNORMAL LOW (ref 1.005–1.030)

## 2017-09-18 LAB — CBC WITH DIFFERENTIAL/PLATELET
BASOS PCT: 0 %
Basophils Absolute: 0 10*3/uL (ref 0.0–0.1)
Eosinophils Absolute: 0.1 10*3/uL (ref 0.0–0.7)
Eosinophils Relative: 2 %
HEMATOCRIT: 41.5 % (ref 36.0–46.0)
HEMOGLOBIN: 13.5 g/dL (ref 12.0–15.0)
LYMPHS ABS: 2.2 10*3/uL (ref 0.7–4.0)
Lymphocytes Relative: 26 %
MCH: 31 pg (ref 26.0–34.0)
MCHC: 32.5 g/dL (ref 30.0–36.0)
MCV: 95.2 fL (ref 78.0–100.0)
Monocytes Absolute: 0.8 10*3/uL (ref 0.1–1.0)
Monocytes Relative: 10 %
NEUTROS ABS: 5 10*3/uL (ref 1.7–7.7)
NEUTROS PCT: 62 %
Platelets: 197 10*3/uL (ref 150–400)
RBC: 4.36 MIL/uL (ref 3.87–5.11)
RDW: 13.1 % (ref 11.5–15.5)
WBC: 8.1 10*3/uL (ref 4.0–10.5)

## 2017-09-18 LAB — PROTIME-INR
INR: 1
PROTHROMBIN TIME: 13.1 s (ref 11.4–15.2)

## 2017-09-18 LAB — COMPREHENSIVE METABOLIC PANEL
ALK PHOS: 43 U/L (ref 38–126)
ALT: 18 U/L (ref 14–54)
ANION GAP: 6 (ref 5–15)
AST: 21 U/L (ref 15–41)
Albumin: 3.3 g/dL — ABNORMAL LOW (ref 3.5–5.0)
BILIRUBIN TOTAL: 0.6 mg/dL (ref 0.3–1.2)
BUN: 17 mg/dL (ref 6–20)
CALCIUM: 8.8 mg/dL — AB (ref 8.9–10.3)
CO2: 24 mmol/L (ref 22–32)
CREATININE: 0.79 mg/dL (ref 0.44–1.00)
Chloride: 106 mmol/L (ref 101–111)
GFR calc Af Amer: >60 mL/min (ref >60)
GFR calc non Af Amer: >60 mL/min (ref >60)
GLUCOSE: 112 mg/dL — AB (ref 65–99)
Potassium: 4 mmol/L (ref 3.5–5.1)
SODIUM: 136 mmol/L (ref 135–145)
TOTAL PROTEIN: 6.8 g/dL (ref 6.5–8.1)

## 2017-09-18 LAB — TROPONIN I

## 2017-09-18 LAB — BRAIN NATRIURETIC PEPTIDE: B Natriuretic Peptide: 220.6 pg/mL — ABNORMAL HIGH (ref 0.0–100.0)

## 2017-09-18 MED ORDER — CEPHALEXIN 500 MG PO CAPS: 500.0000 mg | ORAL_CAPSULE | Freq: Three times a day (TID) | ORAL | 0 refills | Status: DC

## 2017-09-18 MED ORDER — SODIUM CHLORIDE 0.9 % IV BOLUS (SEPSIS)
1000.0000 mL | Freq: Once | INTRAVENOUS | Status: AC
  Administered 2017-09-18: 1000 mL via INTRAVENOUS

## 2017-09-18 MED ORDER — DEXTROSE 5 % IV SOLN
1.0000 g | Freq: Once | INTRAVENOUS | Status: AC
  Administered 2017-09-18: 1 g via INTRAVENOUS
  Filled 2017-09-18: qty 10

## 2017-09-18 NOTE — Discharge Instructions (Signed)
Keep yourself hydrated.  Follow-up with your doctor for recheck this week.  Take the antibiotics as prescribed for urinary tract infection.  Return to the ED if you develop new or worsening symptoms.

## 2017-09-20 LAB — URINE CULTURE: Culture: >=100000 — AB

## 2017-09-21 ENCOUNTER — Telehealth: Payer: Self-pay | Admitting: *Deleted

## 2017-09-21 NOTE — Progress Notes (Signed)
ED Antimicrobial Stewardship Positive Culture Follow Up   [name] is an 71 y.o. female who presented to [facility] on 09/17/2017 with a chief complaint of  Chief Complaint  Patient presents with  . Weakness    Recent Results (from the past 720 hour(s))  Urine Culture     Status: Abnormal   Collection Time: 09/18/17  1:00 AM  Result Value Ref Range Status   Specimen Description URINE, CLEAN CATCH  Final   Special Requests NONE  Final   Culture >=100,000 COLONIES/mL SERRATIA MARCESCENS (A)  Final   Report Status 09/20/2017 FINAL  Final   Organism ID, Bacteria SERRATIA MARCESCENS (A)  Final      Susceptibility   Serratia marcescens - MIC*    CEFAZOLIN >=64 RESISTANT Resistant     CEFTRIAXONE <=1 SENSITIVE Sensitive     CIPROFLOXACIN <=0.25 SENSITIVE Sensitive     GENTAMICIN <=1 SENSITIVE Sensitive     NITROFURANTOIN 128 RESISTANT Resistant     TRIMETH/SULFA <=20 SENSITIVE Sensitive     * >=100,000 COLONIES/mL SERRATIA MARCESCENS   Will contact patient for symptom check - if patient feeling better, not weak will dc all abx.  If not improved will still dc cephalexin and begin bactrim BS BID x 1 week  ED Provider: Delos Haring, PA-C   Wynell Balloon 09/21/2017, 8:55 AM Infectious Diseases Pharmacist Phone# [phone]

## 2017-09-21 NOTE — Telephone Encounter (Signed)
Contacted patient in followup.  States symptoms resolved and feeling much better.  No further treatment indicated.

## 2018-10-02 ENCOUNTER — Emergency Department (HOSPITAL_COMMUNITY): Payer: Medicare Other

## 2018-10-02 ENCOUNTER — Other Ambulatory Visit: Payer: Self-pay

## 2018-10-02 ENCOUNTER — Emergency Department (HOSPITAL_COMMUNITY)
Admission: EM | Admit: 2018-10-02 | Discharge: 2018-10-03 | Disposition: A | Discharge status: Home / Self Care | Payer: Medicare Other | Attending: Emergency Medicine | Admitting: Emergency Medicine

## 2018-10-02 ENCOUNTER — Encounter (HOSPITAL_COMMUNITY): Payer: Self-pay

## 2018-10-02 DIAGNOSIS — Z79899 Other long term (current) drug therapy: Secondary | ICD-10-CM | POA: Insufficient documentation

## 2018-10-02 DIAGNOSIS — Y999 Unspecified external cause status: Secondary | ICD-10-CM | POA: Insufficient documentation

## 2018-10-02 DIAGNOSIS — S299XXA Unspecified injury of thorax, initial encounter: Secondary | ICD-10-CM | POA: Diagnosis present

## 2018-10-02 DIAGNOSIS — R072 Precordial pain: Secondary | ICD-10-CM

## 2018-10-02 DIAGNOSIS — Z7984 Long term (current) use of oral hypoglycemic drugs: Secondary | ICD-10-CM | POA: Insufficient documentation

## 2018-10-02 DIAGNOSIS — Y92410 Unspecified street and highway as the place of occurrence of the external cause: Secondary | ICD-10-CM | POA: Insufficient documentation

## 2018-10-02 DIAGNOSIS — E11319 Type 2 diabetes mellitus with unspecified diabetic retinopathy without macular edema: Secondary | ICD-10-CM | POA: Insufficient documentation

## 2018-10-02 DIAGNOSIS — I2510 Atherosclerotic heart disease of native coronary artery without angina pectoris: Secondary | ICD-10-CM | POA: Diagnosis not present

## 2018-10-02 DIAGNOSIS — I10 Essential (primary) hypertension: Secondary | ICD-10-CM | POA: Diagnosis not present

## 2018-10-02 DIAGNOSIS — N83202 Unspecified ovarian cyst, left side: Secondary | ICD-10-CM | POA: Diagnosis not present

## 2018-10-02 DIAGNOSIS — Z955 Presence of coronary angioplasty implant and graft: Secondary | ICD-10-CM | POA: Diagnosis not present

## 2018-10-02 DIAGNOSIS — Y9389 Activity, other specified: Secondary | ICD-10-CM | POA: Diagnosis not present

## 2018-10-02 DIAGNOSIS — S298XXA Other specified injuries of thorax, initial encounter: Secondary | ICD-10-CM | POA: Diagnosis not present
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
## 2018-10-03 ENCOUNTER — Emergency Department (HOSPITAL_COMMUNITY): Payer: Medicare Other

## 2018-10-03 LAB — BASIC METABOLIC PANEL
Anion gap: 9 (ref 5–15)
BUN: 22 mg/dL (ref 8–23)
CO2: 27 mmol/L (ref 22–32)
CREATININE: 0.97 mg/dL (ref 0.44–1.00)
Calcium: 9 mg/dL (ref 8.9–10.3)
Chloride: 100 mmol/L (ref 98–111)
GFR calc Af Amer: >60 mL/min (ref >60)
GFR calc non Af Amer: 56 mL/min — ABNORMAL LOW (ref >60)
GLUCOSE: 144 mg/dL — AB (ref 70–99)
Potassium: 4.6 mmol/L (ref 3.5–5.1)
SODIUM: 136 mmol/L (ref 135–145)

## 2018-10-03 LAB — CBC WITH DIFFERENTIAL/PLATELET
Abs Immature Granulocytes: 0.03 10*3/uL (ref 0.00–0.07)
BASOS PCT: 1 %
Basophils Absolute: 0 10*3/uL (ref 0.0–0.1)
EOS PCT: 3 %
Eosinophils Absolute: 0.3 10*3/uL (ref 0.0–0.5)
HCT: 40.9 % (ref 36.0–46.0)
Hemoglobin: 12.8 g/dL (ref 12.0–15.0)
Immature Granulocytes: 0 %
Lymphocytes Relative: 20 %
Lymphs Abs: 1.7 10*3/uL (ref 0.7–4.0)
MCH: 30.5 pg (ref 26.0–34.0)
MCHC: 31.3 g/dL (ref 30.0–36.0)
MCV: 97.6 fL (ref 80.0–100.0)
MONO ABS: 0.9 10*3/uL (ref 0.1–1.0)
MONOS PCT: 11 %
Neutro Abs: 5.3 10*3/uL (ref 1.7–7.7)
Neutrophils Relative %: 65 %
Platelets: 193 10*3/uL (ref 150–400)
RBC: 4.19 MIL/uL (ref 3.87–5.11)
RDW: 13.2 % (ref 11.5–15.5)
WBC: 8.2 10*3/uL (ref 4.0–10.5)
nRBC: 0 % (ref 0.0–0.2)

## 2018-10-03 LAB — CBG MONITORING, ED: Glucose-Capillary: 135 mg/dL — ABNORMAL HIGH (ref 70–99)

## 2018-10-03 LAB — TROPONIN I

## 2018-10-03 MED ORDER — NAPROXEN 250 MG PO TABS
500.0000 mg | ORAL_TABLET | Freq: Once | ORAL | Status: AC
Start: 2018-10-03 — End: 2018-10-03
  Administered 2018-10-03: 500 mg via ORAL
  Filled 2018-10-03: qty 2

## 2018-10-03 MED ORDER — IOPAMIDOL (ISOVUE-M 300) INJECTION 61%: 15.0000 mL | Freq: Once | INTRAMUSCULAR | Status: DC | PRN

## 2018-10-03 MED ORDER — IOHEXOL 300 MG/ML  SOLN
100.0000 mL | Freq: Once | INTRAMUSCULAR | Status: AC | PRN
  Administered 2018-10-03: 100 mL via INTRAVENOUS

## 2018-10-03 NOTE — Discharge Instructions (Signed)
As we discussed, you will need to have an outpatient ultrasound of your ovaries due to the cyst.  Please follow-up with Dr. Kenton Kingfisher for this.

## 2018-10-04 ENCOUNTER — Encounter: Payer: Self-pay | Admitting: Surgery

## 2018-10-04 NOTE — Progress Notes (Unsigned)
[place] referral faxed in to [facility] at [facility], will follow up with [facility] in the am

## 2018-12-04 ENCOUNTER — Encounter (INDEPENDENT_AMBULATORY_CARE_PROVIDER_SITE_OTHER): Payer: Self-pay | Admitting: Family Medicine

## 2018-12-04 ENCOUNTER — Ambulatory Visit (INDEPENDENT_AMBULATORY_CARE_PROVIDER_SITE_OTHER): Payer: Self-pay

## 2018-12-04 ENCOUNTER — Ambulatory Visit (INDEPENDENT_AMBULATORY_CARE_PROVIDER_SITE_OTHER): Payer: Medicare Other | Admitting: Family Medicine

## 2018-12-04 DIAGNOSIS — M546 Pain in thoracic spine: Secondary | ICD-10-CM | POA: Diagnosis not present

## 2018-12-04 MED ORDER — BACLOFEN 10 MG PO TABS: 5.0000 mg | ORAL_TABLET | Freq: Three times a day (TID) | ORAL | 3 refills | Status: DC | PRN

## 2018-12-04 MED ORDER — MELOXICAM 7.5 MG PO TABS: 7.5000 mg | ORAL_TABLET | Freq: Every day | ORAL | 3 refills | Status: DC | PRN

## 2018-12-04 NOTE — Progress Notes (Signed)
Office Visit Note   Patient: [name]           Date of Birth: 22-Nov-1946           MRN: [id number] Visit Date: 12/04/2018 Requested by: Shirline Frees, MD [place], [place] [id number] PCP: Shirline Frees, MD  Subjective: Chief Complaint  Patient presents with  . pain in back around shlder blades x 3-4 days    HPI: He is a 73 year old with left-sided thoracic pain.  Symptoms started about 4 or 5 days ago, no injury.  She woke up with pain and it has continued to bother her.  She has had lumbar surgery in the past, this pain feels different.  It does not radiate.  She tried Aleve with no improvement.  No changes in her activities, no changes in her chronic medications.  She has diabetes managed with pills Lantus insulin.  She is on simvastatin chronically.  Had vertebroplasty in the past.  History of multiple compression fractures thoracic spine on CT in November.  Denies fevers, chills, night sweats, unintentional weight change.              ROS: Other systems were reviewed and are negative.  Objective: Vital Signs: There were no vitals taken for this visit.  Physical Exam:  Thoracic spine: She has some tenderness in the midline near around T8-10.  She has tender trigger points in the left rhomboid area that seem to reproduce her pain.  Imaging: X-rays thoracic spine: She has vertebroplasty changes at T8, 11 and 12 which are unchanged compared to CT scan from October 03, 2018.  There is a T7 compression fracture which was not treated with vertebroplasty but appears to be unchanged from November 2019.  I do not see any new compression deformities.    Assessment & Plan: 1.  Left-sided thoracic back pain, possibly myofascial.  Cannot rule out new occult compression fracture. -Short-term meloxicam and baclofen.  If symptoms persist then possibly MRI thoracic spine to look for compression deformity.  If negative, then physical therapy would be a  consideration.  Alternatively we could try physical therapy first.   Follow-Up Instructions: No follow-ups on file.      Procedures: No procedures performed  No notes on file    PMFS History: Patient Active Problem List   Diagnosis Date Noted  . Adjustment disorder with anxious mood 05/31/2016  . Tibia fracture after fall-s/p intramedulaary nailing 05/26/16 05/26/2016  . 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 Epiretinal membrane (ERM) of both eyes 01/26/2016  . Orthostatic hypotension   . Syncope and collapse 07/18/2015  . Compression fracture 07/15/2015  . Sepsis ([place]) 12/05/2014  . Constipation 12/04/2014  . Hematuria   . Intertrochanteric fracture of left hip ([place]) 11/14/2014  . Type 2 diabetes mellitus without complication ([place])   . 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 Contusion of right leg 11/13/2013  . HTN (hypertension) 11/12/2013  . Fall 11/12/2013  . CAD -S/P PCI Feb 2014 01/11/2013  . Oral thrush 01/11/2013  . Hematochezia 01/08/2013  . PSVT-post op 05/26/16 01/05/2013  . History of NSTEMI Feb 2014 01/05/2013  . Hypokalemia 01/04/2013  . Fever 01/04/2013  . H/O vertebroplasty 01/04/2013  . Proliferative diabetic retinopathy ([place]) 08/15/2012  . Pseudoaphakia 08/15/2012  . Cellulitis and abscess of foot 03/31/2012  . Dehydration 03/31/2012  . Hyponatremia 03/31/2012   Past Medical History:  Diagnosis Date  . Anxiety    Due to current back pain and has  to lie flat.  . Arthritis   . CAD (coronary artery disease)    a. NSTEMI in setting of UTI and SVT 12/2012 => LHC 01/08/13: Proximal LAD 30%, ostial diagonal 30-40%, proximal RCA 95%, inferior AK, EF 45-50% => PCI: Promus DES x 2 to RCA;  b. Echo 01/05/13: EF 24-40%, grade 1 diastolic dysfunction, MAC   . Compression fracture    thoracic 12  . Edema   . History of blood transfusion   . Hypercholesteremia   . Hypertension   . Myocardial infarction ([place]) 2014  . Rectal ulceration    colonoscopy 12/2012  . SVT  (supraventricular tachycardia) ([place])   . Type II diabetes mellitus ([facility])     Family History  Problem Relation Age of Onset  . Hypertension Brother     Past Surgical History:  Procedure Laterality Date  . CATARACT EXTRACTION, BILATERAL Bilateral   . COLONOSCOPY Left 01/12/2013   Procedure: COLONOSCOPY;  Surgeon: Wonda Horner, MD;  Location[facility] ENDOSCOPY;  Service: Endoscopy;  Laterality: Left;  . COLONOSCOPY N/A 01/14/2013   Procedure: COLONOSCOPY;  Surgeon: Wonda Horner, MD;  Location: [facility] ENDOSCOPY;  Service: Endoscopy;  Laterality: N/A;  Rm 2034, attemped Colon on 2-15=inadequate prep  . CORONARY ANGIOPLASTY  12/2012  . EYE SURGERY Bilateral "many"  . FRACTURE SURGERY    . KYPHOPLASTY  12/27/2012   Procedure: KYPHOPLASTY;  Surgeon: Sinclair Ship, MD;  Location: [place];  Service: Orthopedics;  Laterality: Bilateral;  T-8 kyphoplasty  . KYPHOPLASTY N/A 07/15/2015   Procedure: T11 KYPHOPLASTY;  Surgeon: Phylliss Bob, MD;  Location: [place];  Service: Orthopedics;  Laterality: N/A;  T11 kyphoplasty  . KYPHOPLASTY N/A 09/17/2015   Procedure: T12 kyphoplasty;  Surgeon: Phylliss Bob, MD;  Location: [place];  Service: Orthopedics;  Laterality: N/A;  T12 kyphoplasty  . LEFT HEART CATHETERIZATION WITH CORONARY ANGIOGRAM N/A 01/08/2013   Procedure: LEFT HEART CATHETERIZATION WITH CORONARY ANGIOGRAM;  Surgeon: Thayer Headings, MD;  Location: [facility] CATH LAB;  Service: Cardiovascular;  Laterality: N/A;  . ORIF HIP FRACTURE Left 11/15/2014   Procedure: OPEN REDUCTION INTERNAL FIXATION HIP INTERTROCH;  Surgeon: Mcarthur Rossetti, MD;  Location: [place];  Service: Orthopedics;  Laterality: Left;  . PERCUTANEOUS STENT INTERVENTION N/A 01/15/2013   Procedure: PERCUTANEOUS STENT INTERVENTION;  Surgeon: Peter M Martinique, MD;  Location: [facility] CATH LAB;  Service: Cardiovascular;  Laterality: N/A;  . TIBIA IM NAIL INSERTION Right 05/26/2016   Procedure: INTRAMEDULLARY (IM) NAIL TIBIAL;  Surgeon: Meredith Pel,  MD;  Location: [place];  Service: Orthopedics;  Laterality: Right;  . TUBAL LIGATION     Social History   Occupational History  . Not on file  Tobacco Use  . Smoking status: Never Smoker  . Smokeless tobacco: Never Used  Substance and Sexual Activity  . Alcohol use: No  . Drug use: No  . Sexual activity: Yes

## 2018-12-06 ENCOUNTER — Emergency Department (HOSPITAL_COMMUNITY): Payer: Medicare Other

## 2018-12-06 ENCOUNTER — Encounter (HOSPITAL_COMMUNITY): Payer: Self-pay | Admitting: Emergency Medicine

## 2018-12-06 ENCOUNTER — Inpatient Hospital Stay (HOSPITAL_COMMUNITY)
Admission: EM | Admit: 2018-12-06 | Discharge: 2018-12-11 | DRG: 689 | Disposition: A | Discharge status: Skilled Nursing Facility | Payer: Medicare Other | Attending: Internal Medicine | Admitting: Internal Medicine

## 2018-12-06 DIAGNOSIS — Z9861 Coronary angioplasty status: Secondary | ICD-10-CM

## 2018-12-06 DIAGNOSIS — E785 Hyperlipidemia, unspecified: Secondary | ICD-10-CM | POA: Diagnosis present

## 2018-12-06 DIAGNOSIS — B9620 Unspecified Escherichia coli [E. coli] as the cause of diseases classified elsewhere: Secondary | ICD-10-CM | POA: Diagnosis present

## 2018-12-06 DIAGNOSIS — E042 Nontoxic multinodular goiter: Secondary | ICD-10-CM | POA: Diagnosis present

## 2018-12-06 DIAGNOSIS — E7800 Pure hypercholesterolemia, unspecified: Secondary | ICD-10-CM | POA: Diagnosis present

## 2018-12-06 DIAGNOSIS — E876 Hypokalemia: Secondary | ICD-10-CM | POA: Diagnosis present

## 2018-12-06 DIAGNOSIS — E113599 Type 2 diabetes mellitus with proliferative diabetic retinopathy without macular edema, unspecified eye: Secondary | ICD-10-CM | POA: Diagnosis present

## 2018-12-06 DIAGNOSIS — G9341 Metabolic encephalopathy: Secondary | ICD-10-CM | POA: Diagnosis present

## 2018-12-06 DIAGNOSIS — I10 Essential (primary) hypertension: Secondary | ICD-10-CM | POA: Diagnosis present

## 2018-12-06 DIAGNOSIS — I252 Old myocardial infarction: Secondary | ICD-10-CM

## 2018-12-06 DIAGNOSIS — H9190 Unspecified hearing loss, unspecified ear: Secondary | ICD-10-CM | POA: Diagnosis present

## 2018-12-06 DIAGNOSIS — I2510 Atherosclerotic heart disease of native coronary artery without angina pectoris: Secondary | ICD-10-CM | POA: Diagnosis present

## 2018-12-06 DIAGNOSIS — Z8249 Family history of ischemic heart disease and other diseases of the circulatory system: Secondary | ICD-10-CM

## 2018-12-06 DIAGNOSIS — F0390 Unspecified dementia without behavioral disturbance: Secondary | ICD-10-CM | POA: Diagnosis present

## 2018-12-06 DIAGNOSIS — R5381 Other malaise: Secondary | ICD-10-CM | POA: Diagnosis present

## 2018-12-06 DIAGNOSIS — N390 Urinary tract infection, site not specified: Secondary | ICD-10-CM | POA: Diagnosis not present

## 2018-12-06 DIAGNOSIS — R32 Unspecified urinary incontinence: Secondary | ICD-10-CM | POA: Diagnosis present

## 2018-12-06 DIAGNOSIS — I110 Hypertensive heart disease with heart failure: Secondary | ICD-10-CM | POA: Diagnosis present

## 2018-12-06 DIAGNOSIS — I5032 Chronic diastolic (congestive) heart failure: Secondary | ICD-10-CM | POA: Diagnosis present

## 2018-12-06 DIAGNOSIS — R159 Full incontinence of feces: Secondary | ICD-10-CM | POA: Diagnosis present

## 2018-12-06 DIAGNOSIS — Z955 Presence of coronary angioplasty implant and graft: Secondary | ICD-10-CM | POA: Diagnosis not present

## 2018-12-06 DIAGNOSIS — F05 Delirium due to known physiological condition: Secondary | ICD-10-CM | POA: Diagnosis present

## 2018-12-06 DIAGNOSIS — E1165 Type 2 diabetes mellitus with hyperglycemia: Secondary | ICD-10-CM | POA: Diagnosis present

## 2018-12-06 DIAGNOSIS — R4182 Altered mental status, unspecified: Secondary | ICD-10-CM

## 2018-12-06 DIAGNOSIS — G9340 Encephalopathy, unspecified: Secondary | ICD-10-CM | POA: Diagnosis not present

## 2018-12-06 DIAGNOSIS — R410 Disorientation, unspecified: Secondary | ICD-10-CM | POA: Diagnosis present

## 2018-12-06 DIAGNOSIS — R0781 Pleurodynia: Secondary | ICD-10-CM | POA: Diagnosis present

## 2018-12-06 LAB — CBC WITH DIFFERENTIAL/PLATELET
Abs Immature Granulocytes: 0.02 10*3/uL (ref 0.00–0.07)
Basophils Absolute: 0 10*3/uL (ref 0.0–0.1)
Basophils Relative: 0 %
Eosinophils Absolute: 0 10*3/uL (ref 0.0–0.5)
Eosinophils Relative: 0 %
HCT: 48.6 % — ABNORMAL HIGH (ref 36.0–46.0)
Hemoglobin: 15.2 g/dL — ABNORMAL HIGH (ref 12.0–15.0)
Immature Granulocytes: 0 %
Lymphocytes Relative: 10 %
Lymphs Abs: 0.8 10*3/uL (ref 0.7–4.0)
MCH: 30 pg (ref 26.0–34.0)
MCHC: 31.3 g/dL (ref 30.0–36.0)
MCV: 95.9 fL (ref 80.0–100.0)
Monocytes Absolute: 0.4 10*3/uL (ref 0.1–1.0)
Monocytes Relative: 5 %
Neutro Abs: 6.9 10*3/uL (ref 1.7–7.7)
Neutrophils Relative %: 85 %
Platelets: 217 10*3/uL (ref 150–400)
RBC: 5.07 MIL/uL (ref 3.87–5.11)
RDW: 13.3 % (ref 11.5–15.5)
WBC: 8.2 10*3/uL (ref 4.0–10.5)
nRBC: 0 % (ref 0.0–0.2)

## 2018-12-06 LAB — COMPREHENSIVE METABOLIC PANEL
ALT: 17 U/L (ref 0–44)
AST: 27 U/L (ref 15–41)
Albumin: 3.5 g/dL (ref 3.5–5.0)
Alkaline Phosphatase: 72 U/L (ref 38–126)
Anion gap: 13 (ref 5–15)
BUN: 10 mg/dL (ref 8–23)
CO2: 23 mmol/L (ref 22–32)
Calcium: 9.4 mg/dL (ref 8.9–10.3)
Chloride: 102 mmol/L (ref 98–111)
Creatinine, Ser: 0.9 mg/dL (ref 0.44–1.00)
GFR calc Af Amer: >60 mL/min (ref >60)
GFR calc non Af Amer: >60 mL/min (ref >60)
Glucose, Bld: 202 mg/dL — ABNORMAL HIGH (ref 70–99)
Potassium: 4.4 mmol/L (ref 3.5–5.1)
Sodium: 138 mmol/L (ref 135–145)
Total Bilirubin: 1.6 mg/dL — ABNORMAL HIGH (ref 0.3–1.2)
Total Protein: 8.1 g/dL (ref 6.5–8.1)

## 2018-12-06 LAB — URINALYSIS, ROUTINE W REFLEX MICROSCOPIC
Bilirubin Urine: NEGATIVE
Glucose, UA: 50 mg/dL — AB
Ketones, ur: 80 mg/dL — AB
Nitrite: NEGATIVE
Protein, ur: 100 mg/dL — AB
Specific Gravity, Urine: 1.018 (ref 1.005–1.030)
WBC, UA: >50 WBC/hpf — ABNORMAL HIGH (ref 0–5)
pH: 5 (ref 5.0–8.0)

## 2018-12-06 LAB — INFLUENZA PANEL BY PCR (TYPE A & B)
Influenza A By PCR: NEGATIVE
Influenza B By PCR: NEGATIVE

## 2018-12-06 LAB — CBG MONITORING, ED: Glucose-Capillary: 181 mg/dL — ABNORMAL HIGH (ref 70–99)

## 2018-12-06 LAB — LIPASE, BLOOD: Lipase: 31 U/L (ref 11–51)

## 2018-12-06 MED ORDER — HYDRALAZINE HCL 20 MG/ML IJ SOLN: 5.0000 mg | INTRAMUSCULAR | Status: DC | PRN

## 2018-12-06 MED ORDER — ENOXAPARIN SODIUM 40 MG/0.4ML ~~LOC~~ SOLN
40.0000 mg | Freq: Every day | SUBCUTANEOUS | Status: DC
  Administered 2018-12-07 – 2018-12-10 (×5): 40 mg via SUBCUTANEOUS
  Filled 2018-12-06 (×5): qty 0.4

## 2018-12-06 MED ORDER — INSULIN GLARGINE 100 UNIT/ML ~~LOC~~ SOLN
10.0000 [IU] | Freq: Every day | SUBCUTANEOUS | Status: DC
  Administered 2018-12-07 (×2): 10 [IU] via SUBCUTANEOUS
  Filled 2018-12-06 (×3): qty 0.1

## 2018-12-06 MED ORDER — NITROGLYCERIN 0.4 MG SL SUBL: 0.4000 mg | SUBLINGUAL_TABLET | SUBLINGUAL | Status: DC | PRN

## 2018-12-06 MED ORDER — METOPROLOL TARTRATE 50 MG PO TABS
50.0000 mg | ORAL_TABLET | Freq: Two times a day (BID) | ORAL | Status: DC
  Administered 2018-12-06 – 2018-12-11 (×10): 50 mg via ORAL
  Filled 2018-12-06 (×3): qty 1
  Filled 2018-12-06: qty 2
  Filled 2018-12-06 (×6): qty 1

## 2018-12-06 MED ORDER — PIPERACILLIN-TAZOBACTAM 3.375 G IVPB 30 MIN
3.3750 g | Freq: Once | INTRAVENOUS | Status: AC
  Administered 2018-12-06: 3.375 g via INTRAVENOUS
  Filled 2018-12-06: qty 50

## 2018-12-06 MED ORDER — VITAMIN B-12 1000 MCG PO TABS
1000.0000 ug | ORAL_TABLET | Freq: Every day | ORAL | Status: DC
  Administered 2018-12-07 – 2018-12-11 (×5): 1000 ug via ORAL
  Filled 2018-12-06 (×5): qty 1

## 2018-12-06 MED ORDER — VANCOMYCIN HCL 10 G IV SOLR
2000.0000 mg | Freq: Once | INTRAVENOUS | Status: AC
  Administered 2018-12-06: 2000 mg via INTRAVENOUS
  Filled 2018-12-06: qty 2000

## 2018-12-06 MED ORDER — ACETAMINOPHEN 650 MG RE SUPP: 650.0000 mg | Freq: Four times a day (QID) | RECTAL | Status: DC | PRN

## 2018-12-06 MED ORDER — SODIUM CHLORIDE 0.9 % IV SOLN
1.0000 g | INTRAVENOUS | Status: DC
  Administered 2018-12-07 – 2018-12-08 (×3): 1 g via INTRAVENOUS
  Filled 2018-12-06: qty 1
  Filled 2018-12-06 (×2): qty 10

## 2018-12-06 MED ORDER — LACTATED RINGERS IV BOLUS
1000.0000 mL | Freq: Once | INTRAVENOUS | Status: AC
  Administered 2018-12-06: 1000 mL via INTRAVENOUS

## 2018-12-06 MED ORDER — SIMVASTATIN 20 MG PO TABS
40.0000 mg | ORAL_TABLET | Freq: Every day | ORAL | Status: DC
  Administered 2018-12-07 – 2018-12-10 (×5): 40 mg via ORAL
  Filled 2018-12-06 (×5): qty 2

## 2018-12-06 MED ORDER — ACETAMINOPHEN 325 MG PO TABS
650.0000 mg | ORAL_TABLET | Freq: Four times a day (QID) | ORAL | Status: DC | PRN
  Administered 2018-12-07 – 2018-12-11 (×4): 650 mg via ORAL
  Filled 2018-12-06 (×4): qty 2

## 2018-12-06 MED ORDER — RAMIPRIL 10 MG PO CAPS
10.0000 mg | ORAL_CAPSULE | Freq: Every day | ORAL | Status: DC
  Administered 2018-12-07 – 2018-12-11 (×5): 10 mg via ORAL
  Filled 2018-12-06: qty 2
  Filled 2018-12-06 (×2): qty 1
  Filled 2018-12-06 (×2): qty 2
  Filled 2018-12-06: qty 1
  Filled 2018-12-06 (×2): qty 2
  Filled 2018-12-06 (×2): qty 1

## 2018-12-06 MED ORDER — BACLOFEN 5 MG HALF TABLET
5.0000 mg | ORAL_TABLET | Freq: Three times a day (TID) | ORAL | Status: DC | PRN
  Filled 2018-12-06: qty 2

## 2018-12-06 MED ORDER — INSULIN ASPART 100 UNIT/ML ~~LOC~~ SOLN
0.0000 [IU] | Freq: Three times a day (TID) | SUBCUTANEOUS | Status: DC
  Administered 2018-12-07: 1 [IU] via SUBCUTANEOUS
  Administered 2018-12-07: 3 [IU] via SUBCUTANEOUS
  Administered 2018-12-08: 5 [IU] via SUBCUTANEOUS
  Administered 2018-12-08 – 2018-12-09 (×3): 1 [IU] via SUBCUTANEOUS
  Administered 2018-12-09: 3 [IU] via SUBCUTANEOUS
  Administered 2018-12-09 – 2018-12-10 (×2): 1 [IU] via SUBCUTANEOUS
  Administered 2018-12-10: 2 [IU] via SUBCUTANEOUS
  Administered 2018-12-10: 1 [IU] via SUBCUTANEOUS
  Administered 2018-12-11 (×2): 2 [IU] via SUBCUTANEOUS

## 2018-12-06 MED ORDER — ONDANSETRON HCL 4 MG PO TABS: 4.0000 mg | ORAL_TABLET | Freq: Four times a day (QID) | ORAL | Status: DC | PRN

## 2018-12-06 MED ORDER — ONDANSETRON HCL 4 MG/2ML IJ SOLN: 4.0000 mg | Freq: Four times a day (QID) | INTRAMUSCULAR | Status: DC | PRN

## 2018-12-06 NOTE — H&P (Signed)
History and Physical    [name] XVQ:[id number] DOB: 04-27-1946 DOA: 12/06/2018  PCP: Shirline Frees, MD  Patient coming from: Home.  Chief Complaint: Altered mental status.  HPI: [name] is a 73 y.o. female with history of CAD status post stenting in 0932, diastolic CHF last EF measured in August 2016 was 64 to 60% with grade 1 diastolic dysfunction, diabetes mellitus type 2, hypertension, hyperlipidemia who had undergone T12 kyphoplasty in 2016 has been experiencing some left upper thoracic pain for the last 1 week.  Had gone to her PCP on December 04, 2018 and had x-rays done which did not show any acute compression fractures.  Was prescribed meloxicam and baclofen which patient has been taking for the first time.  Earlier today patient's friend was trying to get in touch with her and was unable to reach her and so called the EMS and EMS found patient on the bed confused and had incontinence of urine and bowel.  Patient was brought to the ER.  ED Course: In the ER patient was tachycardic afebrile confused which gradually improved.  CT of the head was unremarkable chest x-ray did not show anything acute.  UA is consistent with UTI and was placed on antibiotics.  On my exam patient states she has been having significant pain in the left chest area on exam is tender to touch.  Denies any fall.  EKG shows sinus tachycardia but at this time after the fluids patient's heart rate is improved and I also given 1 dose of metoprolol.  Patient is presently alert awake oriented to time place and person moves all extremities and likely cause of acute encephalopathy could be UTI and patient's recent addition of medications.  Urine drug screen is unremarkable.  Review of Systems: As per HPI, rest all negative.   Past Medical History:  Diagnosis Date  . Anxiety    Due to current back pain and has to lie flat.  . Arthritis   . CAD (coronary artery disease)    a. NSTEMI in setting of UTI and  SVT 12/2012 => LHC 01/08/13: Proximal LAD 30%, ostial diagonal 30-40%, proximal RCA 95%, inferior AK, EF 45-50% => PCI: Promus DES x 2 to RCA;  b. Echo 01/05/13: EF 67-12%, grade 1 diastolic dysfunction, MAC   . Compression fracture    thoracic 12  . Edema   . History of blood transfusion   . Hypercholesteremia   . Hypertension   . Myocardial infarction ([place]) 2014  . Rectal ulceration    colonoscopy 12/2012  . SVT (supraventricular tachycardia) ([place])   . Type II diabetes mellitus ([place])     Past Surgical History:  Procedure Laterality Date  . CATARACT EXTRACTION, BILATERAL Bilateral   . COLONOSCOPY Left 01/12/2013   Procedure: COLONOSCOPY;  Surgeon: Wonda Horner, MD;  Location: [facility] ENDOSCOPY;  Service: Endoscopy;  Laterality: Left;  . COLONOSCOPY N/A 01/14/2013   Procedure: COLONOSCOPY;  Surgeon: Wonda Horner, MD;  Location: [facility] ENDOSCOPY;  Service: Endoscopy;  Laterality: N/A;  Rm 2034, attemped Colon on 2-15=inadequate prep  . CORONARY ANGIOPLASTY  12/2012  . EYE SURGERY Bilateral "many"  . FRACTURE SURGERY    . KYPHOPLASTY  12/27/2012   Procedure: KYPHOPLASTY;  Surgeon: Sinclair Ship, MD;  Location: [place];  Service: Orthopedics;  Laterality: Bilateral;  T-8 kyphoplasty  . KYPHOPLASTY N/A 07/15/2015   Procedure: T11 KYPHOPLASTY;  Surgeon: Phylliss Bob, MD;  Location: [place];  Service: Orthopedics;  Laterality: N/A;  T11 kyphoplasty  . KYPHOPLASTY N/A 09/17/2015   Procedure: T12 kyphoplasty;  Surgeon: Phylliss Bob, MD;  Location: [place];  Service: Orthopedics;  Laterality: N/A;  T12 kyphoplasty  . LEFT HEART CATHETERIZATION WITH CORONARY ANGIOGRAM N/A 01/08/2013   Procedure: LEFT HEART CATHETERIZATION WITH CORONARY ANGIOGRAM;  Surgeon: Thayer Headings, MD;  Location: [facility] CATH LAB;  Service: Cardiovascular;  Laterality: N/A;  . ORIF HIP FRACTURE Left 11/15/2014   Procedure: OPEN REDUCTION INTERNAL FIXATION HIP INTERTROCH;  Surgeon: Mcarthur Rossetti, MD;  Location: [place];  Service:  Orthopedics;  Laterality: Left;  . PERCUTANEOUS STENT INTERVENTION N/A 01/15/2013   Procedure: PERCUTANEOUS STENT INTERVENTION;  Surgeon: Peter M Martinique, MD;  Location: [facility] C[facility] LAB;  Service: Cardiovascular;  Laterality: N/A;  . TIBIA IM NAIL INSERTION Right 05/26/2016   Procedure: INTRAMEDULLARY (IM) NAIL TIBIAL;  Surgeon: Meredith Pel, MD;  Location: [place];  Service: Orthopedics;  Laterality: Right;  . TUBAL LIGATION       reports that she has never smoked. She has never used smokeless tobacco. She reports that she does not drink alcohol or use drugs.  Allergies  Allergen Reactions  . Oxycodone Nausea Only    Family History  Problem Relation Age of Onset  . Hypertension Brother     Prior to Admission medications   Medication Sig Start Date End Date Taking? Authorizing Provider  baclofen (LIORESAL) 10 MG tablet Take 0.5-1 tablets (5-10 mg total) by mouth 3 (three) times daily as needed for muscle spasms. 12/04/18   Hilts, Legrand Como, MD  calcium-vitamin D (OSCAL WITH D) 500-200 MG-UNIT tablet Take 1 tablet by mouth 2 (two) times daily.    [provider]  Cholecalciferol (VITAMIN D3 PO) Take 1 tablet by mouth daily.    [provider]  furosemide (LASIX) 40 MG tablet Take 40 mg by mouth daily as needed for fluid.     [provider]  LANTUS 100 UNIT/ML injection Take 10 Units by mouth at bedtime.  08/06/15   [provider]  meloxicam (MOBIC) 7.5 MG tablet Take 1 tablet (7.5 mg total) by mouth daily as needed for pain. 12/04/18   Hilts, Legrand Como, MD  metFORMIN (GLUCOPHAGE) 1000 MG tablet Take 1,000 mg by mouth 2 (two) times daily.  08/26/15   [provider]  metoprolol (LOPRESSOR) 50 MG tablet Take 1 tablet (50 mg total) by mouth 2 (two) times daily. PLEASE CONTACT OFFICE FOR ADDITIONAL REFILLS 09/29/16   Minus Breeding, MD  nitroGLYCERIN (NITROSTAT) 0.4 MG SL tablet Place 1 tablet (0.4 mg total) under the tongue every 5 (five) minutes as needed  for chest pain. 04/16/13   Richardson Dopp T, PA-C  ondansetron (ZOFRAN ODT) 4 MG disintegrating tablet Take 1 tablet (4 mg total) by mouth every 8 (eight) hours as needed for nausea or vomiting. 06/28/15   Jule Ser, DO  pioglitazone (ACTOS) 30 MG tablet Take 30 mg by mouth daily.  09/08/15   [provider]  ramipril (ALTACE) 10 MG capsule Take 10 mg by mouth daily.  08/26/15   [provider]  simvastatin (ZOCOR) 40 MG tablet Take 40 mg by mouth at bedtime.    [provider]  vitamin B-12 (CYANOCOBALAMIN) 1000 MCG tablet Take 1,000 mcg by mouth daily.    [provider]    Physical Exam: Vitals:   12/06/18 1845 12/06/18 1900 12/06/18 1930 12/06/18 2132  BP:  (!) 167/140 (!) 203/140 (!) 192/116  Pulse: (!) 115 (!) 145 (!) 110 Marland Kitchen)  122  Resp: _0 (!) 24  Temp:      TempSrc:      SpO2: 98% 95% 90% 98%      Constitutional: Moderately built and nourished. Vitals:   12/06/18 1845 12/06/18 1900 12/06/18 1930 12/06/18 2132  BP:  (!) 167/140 (!) 203/140 (!) 192/116  Pulse: (!) 115 (!) 145 (!) 110 (!) 122  Resp: _1 (!) 24  Temp:      TempSrc:      SpO2: 98% 95% 90% 98%   Eyes: Anicteric no pallor. ENMT: No discharge from the ears eyes nose or mouth. Neck: No mass or.  No neck rigidity. Respiratory: No rhonchi or crepitations. Cardiovascular: S1-S2 heard. Abdomen: Soft nontender bowel sounds present. Musculoskeletal: No edema.  No joint effusion. Skin: No rash. Neurologic: Alert awake oriented to time place and person.  Moves all extremities. Psychiatric: Appears normal.  Normal affect.   Labs on Admission: I have personally reviewed following labs and imaging studies  CBC: Recent Labs  Lab 12/06/18 1631  WBC 8.2  NEUTROABS 6.9  HGB 15.2*  HCT 48.6*  MCV 95.9  PLT 967   Basic Metabolic Panel: Recent Labs  Lab 12/06/18 1631  NA 138  K 4.4  CL 102  CO2 23  GLUCOSE 202*  BUN 10  CREATININE 0.90  CALCIUM 9.4    GFR: CrCl cannot be calculated (Unknown ideal weight.). Liver Function Tests: Recent Labs  Lab 12/06/18 1631  AST 27  ALT 17  ALKPHOS 72  BILITOT 1.6*  PROT 8.1  ALBUMIN 3.5   Recent Labs  Lab 12/06/18 1631  LIPASE 31   No results for input(s): AMMONIA in the last 168 hours. Coagulation Profile: No results for input(s): INR, PROTIME in the last 168 hours. Cardiac Enzymes: No results for input(s): CKTOTAL, CKMB, CKMBINDEX, TROPONINI in the last 168 hours. BNP (last 3 results) No results for input(s): PROBNP in the last 8760 hours. HbA1C: No results for input(s): HGBA1C in the last 72 hours. CBG: Recent Labs  Lab 12/06/18 1625  GLUCAP 181*   Lipid Profile: No results for input(s): CHOL, HDL, LDLCALC, TRIG, CHOLHDL, LDLDIRECT in the last 72 hours. Thyroid Function Tests: No results for input(s): TSH, T4TOTAL, FREET4, T3FREE, THYROIDAB in the last 72 hours. Anemia Panel: No results for input(s): VITAMINB12, FOLATE, FERRITIN, TIBC, IRON, RETICCTPCT in the last 72 hours. Urine analysis:    Component Value Date/Time   COLORURINE YELLOW 12/06/2018 1631   APPEARANCEUR CLOUDY (A) 12/06/2018 1631   LABSPEC 1.018 12/06/2018 1631   PHURINE 5.0 12/06/2018 1631   GLUCOSEU 50 (A) 12/06/2018 1631   HGBUR SMALL (A) 12/06/2018 1631   BILIRUBINUR NEGATIVE 12/06/2018 1631   KETONESUR 80 (A) 12/06/2018 1631   PROTEINUR 100 (A) 12/06/2018 1631   UROBILINOGEN 1.0 06/28/2015 1552   NITRITE NEGATIVE 12/06/2018 1631   LEUKOCYTESUR LARGE (A) 12/06/2018 1631   Sepsis Labs: _2 (procalcitonin:4,lacticidven:4) )No results found for this or any previous visit (from the past 240 hour(s)).   Radiological Exams on Admission: Ct Head Wo Contrast  Result Date: 12/06/2018 CLINICAL DATA:  Altered mental status EXAM: CT HEAD WITHOUT CONTRAST TECHNIQUE: Contiguous axial images were obtained from the base of the skull through the vertex without intravenous contrast. COMPARISON:   09/18/2017 FINDINGS: Brain: Mild atrophic and chronic white matter ischemic changes are seen. The overall appearance is stable from the prior study. No findings to suggest acute hemorrhage, acute infarction or space-occupying mass lesion are seen. Vascular: No hyperdense  vessel or unexpected calcification. Skull: Normal. Negative for fracture or focal lesion. Sinuses/Orbits: No acute finding. Other: None. IMPRESSION: Chronic atrophic and ischemic changes without acute abnormality. Electronically Signed   By: Inez Catalina M.D.   On: 12/06/2018 19:31   Dg Chest Portable 1 View  Result Date: 12/06/2018 CLINICAL DATA:  Tachycardia.  Confusion.  Suspect sepsis. EXAM: PORTABLE CHEST 1 VIEW COMPARISON:  12/04/2018 FINDINGS: The heart is moderately enlarged. Normal vascularity. Subsegmental atelectasis at the left base. No pneumothorax. No consolidation. IMPRESSION: Cardiomegaly without decompensation. Electronically Signed   By: Marybelle Killings M.D.   On: 12/06/2018 17:19    EKG: Independently reviewed.  Sinus tachycardia.  Assessment/Plan Principal Problem:   Acute encephalopathy Active Problems:   CAD -S/P PCI Feb 2014   HTN (hypertension)   Uncontrolled type 2 diabetes mellitus with hyperglycemia (HCC)   Acute lower UTI    1. Acute encephalopathy essentially resolved and back to baseline.  Likely from UTI and could also be from recent use of baclofen for pain.  Urine drug screen is negative.  CT head was unremarkable.  We will continue with ceftriaxone follow cultures given the initial presentation of possible sepsis.  Continue gentle hydration. 2. Left sided chest pain on palpation -patient has significant pain in the left side of her chest.  Will avoid any pain relief medications for now and get a CT scan of the chest. 3. UTI -see #1. 4. CAD status post stenting on statins beta-blocker aspirin. 5. Hypertension uncontrolled on beta-blocker and ramipril.  PRN IV hydralazine. 6. History of diastolic  CHF last EF measured in 2016 was 55 to 60% with grade 1 diastolic dysfunction.  Will hold Lasix for now and gently hydrate. 7. Diabetes mellitus type 2 we will keep patient on sliding scale coverage.   DVT prophylaxis: Lovenox. Code Status: Full code. Family Communication: Discussed with patient. Disposition Plan: Home. Consults called: None. Admission status: Observation.   Rise Patience MD [facility] Pager [phone].  If 7PM-7AM, please contact night-coverage [email or web address] Password [facility]  12/06/2018, 9:56 PM

## 2018-12-06 NOTE — ED Notes (Signed)
Admitting paged about BP. 192/116

## 2018-12-06 NOTE — ED Notes (Signed)
Pt back from CT

## 2018-12-06 NOTE — ED Notes (Signed)
Still having difficulty obtaning blood for second set of blood cultures.

## 2018-12-06 NOTE — ED Triage Notes (Signed)
Per [facility]: Patient to ED from home with altered mental status, possible sepsis. Patient lives alone and when friend could not get her to come to the door - Psychologist, occupational. checked and found patient in bed incontinent of urine and stool. HR 130-170, 160/90, 94% RA, CBG 167. 24g. PIV R hand - given 250cc NS PTA. Patient A&O x 3.

## 2018-12-06 NOTE — ED Notes (Signed)
MD aware of BP

## 2018-12-06 NOTE — ED Notes (Addendum)
Patient transported to CT 

## 2018-12-07 ENCOUNTER — Observation Stay (HOSPITAL_COMMUNITY): Payer: Medicare Other

## 2018-12-07 DIAGNOSIS — G9340 Encephalopathy, unspecified: Secondary | ICD-10-CM | POA: Diagnosis not present

## 2018-12-07 DIAGNOSIS — N390 Urinary tract infection, site not specified: Secondary | ICD-10-CM | POA: Diagnosis not present

## 2018-12-07 DIAGNOSIS — I2510 Atherosclerotic heart disease of native coronary artery without angina pectoris: Secondary | ICD-10-CM | POA: Diagnosis not present

## 2018-12-07 DIAGNOSIS — I10 Essential (primary) hypertension: Secondary | ICD-10-CM

## 2018-12-07 DIAGNOSIS — E876 Hypokalemia: Secondary | ICD-10-CM | POA: Diagnosis not present

## 2018-12-07 DIAGNOSIS — Z9861 Coronary angioplasty status: Secondary | ICD-10-CM

## 2018-12-07 LAB — COMPREHENSIVE METABOLIC PANEL
ALT: 14 U/L (ref 0–44)
AST: 16 U/L (ref 15–41)
Albumin: 2.8 g/dL — ABNORMAL LOW (ref 3.5–5.0)
Alkaline Phosphatase: 49 U/L (ref 38–126)
Anion gap: 9 (ref 5–15)
BILIRUBIN TOTAL: 0.6 mg/dL (ref 0.3–1.2)
BUN: 13 mg/dL (ref 8–23)
CO2: 26 mmol/L (ref 22–32)
Calcium: 8.6 mg/dL — ABNORMAL LOW (ref 8.9–10.3)
Chloride: 103 mmol/L (ref 98–111)
Creatinine, Ser: 0.85 mg/dL (ref 0.44–1.00)
GFR calc Af Amer: >60 mL/min (ref >60)
GFR calc non Af Amer: >60 mL/min (ref >60)
Glucose, Bld: 159 mg/dL — ABNORMAL HIGH (ref 70–99)
Potassium: 2.9 mmol/L — ABNORMAL LOW (ref 3.5–5.1)
Sodium: 138 mmol/L (ref 135–145)
TOTAL PROTEIN: 6.1 g/dL — AB (ref 6.5–8.1)

## 2018-12-07 LAB — CBC WITH DIFFERENTIAL/PLATELET
ABS IMMATURE GRANULOCYTES: 0.04 10*3/uL (ref 0.00–0.07)
Basophils Absolute: 0 10*3/uL (ref 0.0–0.1)
Basophils Relative: 0 %
Eosinophils Absolute: 0 10*3/uL (ref 0.0–0.5)
Eosinophils Relative: 0 %
HCT: 39.4 % (ref 36.0–46.0)
Hemoglobin: 12.6 g/dL (ref 12.0–15.0)
Immature Granulocytes: 0 %
Lymphocytes Relative: 11 %
Lymphs Abs: 1.2 10*3/uL (ref 0.7–4.0)
MCH: 30.1 pg (ref 26.0–34.0)
MCHC: 32 g/dL (ref 30.0–36.0)
MCV: 94.3 fL (ref 80.0–100.0)
Monocytes Absolute: 1.5 10*3/uL — ABNORMAL HIGH (ref 0.1–1.0)
Monocytes Relative: 14 %
NEUTROS ABS: 7.6 10*3/uL (ref 1.7–7.7)
Neutrophils Relative %: 75 %
Platelets: 195 10*3/uL (ref 150–400)
RBC: 4.18 MIL/uL (ref 3.87–5.11)
RDW: 13.2 % (ref 11.5–15.5)
WBC: 10.4 10*3/uL (ref 4.0–10.5)
nRBC: 0 % (ref 0.0–0.2)

## 2018-12-07 LAB — TSH: TSH: 0.655 u[IU]/mL (ref 0.350–4.500)

## 2018-12-07 LAB — GLUCOSE, CAPILLARY
GLUCOSE-CAPILLARY: 146 mg/dL — AB (ref 70–99)
GLUCOSE-CAPILLARY: 218 mg/dL — AB (ref 70–99)
Glucose-Capillary: 125 mg/dL — ABNORMAL HIGH (ref 70–99)
Glucose-Capillary: 221 mg/dL — ABNORMAL HIGH (ref 70–99)
Glucose-Capillary: 117 mg/dL — ABNORMAL HIGH (ref 70–99)

## 2018-12-07 LAB — RAPID URINE DRUG SCREEN, HOSP PERFORMED
Amphetamines: NOT DETECTED
BARBITURATES: NOT DETECTED
Benzodiazepines: NOT DETECTED
Cocaine: NOT DETECTED
Opiates: NOT DETECTED
Tetrahydrocannabinol: NOT DETECTED

## 2018-12-07 LAB — TROPONIN I
Troponin I: 0.15 ng/mL (ref <0.03)
Troponin I: 0.18 ng/mL (ref <0.03)
Troponin I: 0.13 ng/mL (ref <0.03)

## 2018-12-07 LAB — POTASSIUM: Potassium: 3.4 mmol/L — ABNORMAL LOW (ref 3.5–5.1)

## 2018-12-07 MED ORDER — POTASSIUM CHLORIDE 20 MEQ PO PACK
40.0000 meq | PACK | Freq: Once | ORAL | Status: AC
  Administered 2018-12-07: 40 meq via ORAL
  Filled 2018-12-07: qty 2

## 2018-12-07 MED ORDER — POTASSIUM CHLORIDE 10 MEQ/100ML IV SOLN
10.0000 meq | INTRAVENOUS | Status: AC
  Administered 2018-12-07 (×2): 10 meq via INTRAVENOUS
  Filled 2018-12-07 (×2): qty 100

## 2018-12-07 MED ORDER — LIDOCAINE 5 % EX PTCH
1.0000 | MEDICATED_PATCH | CUTANEOUS | Status: DC
  Administered 2018-12-07 – 2018-12-10 (×4): 1 via TRANSDERMAL
  Filled 2018-12-07 (×6): qty 1

## 2018-12-07 MED ORDER — IOPAMIDOL (ISOVUE-370) INJECTION 76%
100.0000 mL | Freq: Once | INTRAVENOUS | Status: AC | PRN
  Administered 2018-12-07: 100 mL via INTRAVENOUS

## 2018-12-07 NOTE — Plan of Care (Signed)
Patient stable, discussed POC with patient, agreeable with plan, denies question/concerns at this time.  
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
## 2018-12-07 NOTE — Care Management Note (Signed)
Case Management Note  Patient Details  Name: [name] MRN: [id number] Date of Birth: 17-Jan-1946  Subjective/Objective:     Pt in with acute encephalopathy. She if from home alone. Pt states her friend Didi can assist but cant stay with her.     DME: walker No issues obtaining her meds.   Pt has been driving.           Action/Plan: MD please order PT/OT evals as needed. CM following for d/c needs, physician orders.   Expected Discharge Date:                  Expected Discharge Plan:     In-House Referral:     Discharge planning Services     Post Acute Care Choice:    Choice offered to:     DME Arranged:    DME Agency:     HH Arranged:    HH Agency:     Status of Service:  In process, will continue to follow  If discussed at Long Length of Stay Meetings, dates discussed:    Additional Comments:  Pollie Friar, RN 12/07/2018, 1:52 PM

## 2018-12-07 NOTE — Progress Notes (Signed)
CRITICAL VALUE ALERT  Critical Value:  Troponin = 0.15  Date & Time Notied: 1/10//2020,  0028  Provider Notified: Mid-Level [place]   Orders Received/Actions taken

## 2018-12-08 ENCOUNTER — Observation Stay (HOSPITAL_BASED_OUTPATIENT_CLINIC_OR_DEPARTMENT_OTHER): Payer: Medicare Other

## 2018-12-08 DIAGNOSIS — G9341 Metabolic encephalopathy: Secondary | ICD-10-CM | POA: Diagnosis not present

## 2018-12-08 DIAGNOSIS — N390 Urinary tract infection, site not specified: Principal | ICD-10-CM

## 2018-12-08 DIAGNOSIS — E1165 Type 2 diabetes mellitus with hyperglycemia: Secondary | ICD-10-CM | POA: Diagnosis not present

## 2018-12-08 DIAGNOSIS — I340 Nonrheumatic mitral (valve) insufficiency: Secondary | ICD-10-CM

## 2018-12-08 DIAGNOSIS — B9620 Unspecified Escherichia coli [E. coli] as the cause of diseases classified elsewhere: Secondary | ICD-10-CM

## 2018-12-08 DIAGNOSIS — E876 Hypokalemia: Secondary | ICD-10-CM

## 2018-12-08 LAB — BASIC METABOLIC PANEL
Anion gap: 10 (ref 5–15)
BUN: 21 mg/dL (ref 8–23)
CO2: 26 mmol/L (ref 22–32)
Calcium: 8.9 mg/dL (ref 8.9–10.3)
Chloride: 103 mmol/L (ref 98–111)
Creatinine, Ser: 1.02 mg/dL — ABNORMAL HIGH (ref 0.44–1.00)
GFR calc Af Amer: >60 mL/min (ref >60)
GFR calc non Af Amer: 54 mL/min — ABNORMAL LOW (ref >60)
Glucose, Bld: 122 mg/dL — ABNORMAL HIGH (ref 70–99)
Potassium: 3.2 mmol/L — ABNORMAL LOW (ref 3.5–5.1)
Sodium: 139 mmol/L (ref 135–145)

## 2018-12-08 LAB — GLUCOSE, CAPILLARY
Glucose-Capillary: 257 mg/dL — ABNORMAL HIGH (ref 70–99)
Glucose-Capillary: 149 mg/dL — ABNORMAL HIGH (ref 70–99)

## 2018-12-08 LAB — ECHOCARDIOGRAM COMPLETE: Weight: 3848.35 oz

## 2018-12-08 MED ORDER — INSULIN GLARGINE 100 UNIT/ML ~~LOC~~ SOLN
15.0000 [IU] | Freq: Every day | SUBCUTANEOUS | Status: DC
  Administered 2018-12-08 – 2018-12-10 (×3): 15 [IU] via SUBCUTANEOUS
  Filled 2018-12-08 (×5): qty 0.15

## 2018-12-08 MED ORDER — FUROSEMIDE 40 MG PO TABS
40.0000 mg | ORAL_TABLET | Freq: Every day | ORAL | Status: DC
  Administered 2018-12-08 – 2018-12-11 (×4): 40 mg via ORAL
  Filled 2018-12-08 (×4): qty 1

## 2018-12-08 MED ORDER — POTASSIUM CHLORIDE CRYS ER 20 MEQ PO TBCR
40.0000 meq | EXTENDED_RELEASE_TABLET | Freq: Once | ORAL | Status: AC
  Administered 2018-12-08: 40 meq via ORAL
  Filled 2018-12-08: qty 2

## 2018-12-08 MED ORDER — SODIUM CHLORIDE 0.9 % IV SOLN
INTRAVENOUS | Status: DC | PRN
  Administered 2018-12-08: 250 mL via INTRAVENOUS

## 2018-12-08 NOTE — Plan of Care (Signed)
  Problem: Education: Goal: Knowledge of General Education information will improve Description Including pain rating scale, medication(s)/side effects and non-pharmacologic comfort measures Outcome: Progressing Note:  POC reviewed with pt.   

## 2018-12-08 NOTE — Progress Notes (Addendum)
PROGRESS NOTE                                                                                                                                                                                                             Patient Demographics:    [name], is a 73 y.o. female, DOB - 09-21-1946, KZS:[id number]  Admit date - 12/06/2018   Admitting Physician Rise Patience, MD  Outpatient Primary MD for the patient is Shirline Frees, MD  LOS - 0  Outpatient Specialists: None  Chief Complaint  Patient presents with  . Altered Mental Status       Brief Narrative   73 year old female with history of CAD status post stenting in 7322, chronic diastolic CHF, diabetes mellitus type 2, hypertension, hyperlipidemia, thoracic kyphoplasty who was having left sided chest pain underneath her left breast for past 1 week.  She went to her PCP few days back and had x-rays done which was negative for any fracture.  She was prescribed meloxicam and baclofen.  Patient lives alone and a friend called EMS who  found her to be confused and incontinent with urine and bowel.  She was brought to the ED. Head CT unremarkable.  Chest x-ray without any infiltrate.  UA was suggestive of UTI.   Subjective:   Patient reports some left-sided chest pain on pressure which is much better after Lidoderm patch placed yesterday..  Denies any symptoms.   Assessment  & Plan :    Principal Problem:   Acute metabolic encephalopathy Possibly secondary to UTI and somnolence with baclofen.  Cannot rule out seizures associated with it.  Mental status nearing baseline. Urine culture growing E. coli.  Continue Rocephin pending sensitivity.  Active problems Left-sided chest pain. EKG on presentation showed sinus tachycardia in the 130s.  Troponin peaked at 0.18.  CT of the chest showed old rib fractures.  Pain is reproducible on exam.   Check 2D  echo.  Essential hypertension/chronic diastolic CHF/coronary artery disease Euvolemic.  Resumed home dose beta-blocker and ACE inhibitor.  Resume Lasix. Continue aspirin.  Diabetes mellitus type 2, uncontrolled with hyperglycemia Increase Lantus dose to 15 units along with sliding scale coverage.  Metformin and Actos on hold.  Hypokalemia Replenished  Physical deconditioning Seen by PT/OT and recommend SNF.  Ascending aortic aneurysm measuring 4.2 cm Incidental finding on  CT. Recommend annual follow up with CTA or MRI chest.  Thyroid nodule with multiple small nodules  check TSH and thyroid ultrasound    Code Status : Full code  Family Communication  : None at bedside  Disposition Plan  : Needs SNF.  Barriers For Discharge : Active symptoms  Consults  : None  Procedures  : CT head, 2D echo  DVT Prophylaxis  :  Lovenox -   Lab Results  Component Value Date   PLT 195 12/07/2018    Antibiotics  :   Anti-infectives (From admission, onward)   Start     Dose/Rate Route Frequency Ordered Stop   12/06/18 2300  cefTRIAXone (ROCEPHIN) 1 g in sodium chloride 0.9 % 100 mL IVPB     1 g 200 mL/hr over 30 Minutes Intravenous Every 24 hours 12/06/18 2157     12/06/18 1700  vancomycin (VANCOCIN) 2,000 mg in sodium chloride 0.9 % 500 mL IVPB     2,000 mg 250 mL/hr over 120 Minutes Intravenous  Once 12/06/18 1628 12/06/18 2023   12/06/18 1630  piperacillin-tazobactam (ZOSYN) IVPB 3.375 g     3.375 g 100 mL/hr over 30 Minutes Intravenous  Once 12/06/18 1628 12/06/18 1852        Objective:   Vitals:   12/08/18 0024 12/08/18 0353 12/08/18 0815 12/08/18 1257  BP: (!) 147/74 (!) 148/81 (!) 150/90 117/65  Pulse: 94 76 79 60  Resp: 16 17 20 20   Temp: 98.1 F (36.7 C) 98.1 F (36.7 C) 98 F (36.7 C) 98.4 F (36.9 C)  TempSrc: Oral Oral Oral Oral  SpO2: 95% 94% 92% 92%  Weight: 109.1 kg       Wt Readings from Last 3 Encounters:  12/08/18 109.1 kg  10/02/18 108.4 kg   06/15/16 88.5 kg     Intake/Output Summary (Last 24 hours) at 12/08/2018 1537 Last data filed at 12/08/2018 0403 Gross per 24 hour  Intake 240 ml  Output 425 ml  Net -185 ml     Physical Exam  Gen: not in distress HEENT: moist mucosa, supple neck Chest: clear b/l, no added sounds, tender to pressure over the left side of the chest CVS: N S1&S2, no murmurs,  GI: soft, NT, ND,  Musculoskeletal: warm, no edema     Data Review:    CBC Recent Labs  Lab 12/06/18 1631 12/07/18 0411  WBC 8.2 10.4  HGB 15.2* 12.6  HCT 48.6* 39.4  PLT 217 195  MCV 95.9 94.3  MCH 30.0 30.1  MCHC 31.3 32.0  RDW 13.3 13.2  LYMPHSABS 0.8 1.2  MONOABS 0.4 1.5*  EOSABS 0.0 0.0  BASOSABS 0.0 0.0    Chemistries  Recent Labs  Lab 12/06/18 1631 12/07/18 0411 12/07/18 2159 12/08/18 0444  NA 138 138  --  139  K 4.4 2.9* 3.4* 3.2*  CL 102 103  --  103  CO2 23 26  --  26  GLUCOSE 202* 159*  --  122*  BUN 10 13  --  21  CREATININE 0.90 0.85  --  1.02*  CALCIUM 9.4 8.6*  --  8.9  AST 27 16  --   --   ALT 17 14  --   --   ALKPHOS 72 49  --   --   BILITOT 1.6* 0.6  --   --    ------------------------------------------------------------------------------------------------------------------ No results for input(s): CHOL, HDL, LDLCALC, TRIG, CHOLHDL, LDLDIRECT in the last 72 hours.  Lab Results  Component  Value Date   HGBA1C 5.3 07/18/2015   ------------------------------------------------------------------------------------------------------------------ Recent Labs    12/06/18 2252  TSH 0.655   ------------------------------------------------------------------------------------------------------------------ No results for input(s): VITAMINB12, FOLATE, FERRITIN, TIBC, IRON, RETICCTPCT in the last 72 hours.  Coagulation profile No results for input(s): INR, PROTIME in the last 168 hours.  No results for input(s): DDIMER in the last 72 hours.  Cardiac Enzymes Recent Labs  Lab  12/06/18 2252 12/07/18 0411 12/07/18 0955  TROPONINI 0.15* 0.18* 0.13*   ------------------------------------------------------------------------------------------------------------------    Component Value Date/Time   BNP 220.6 (H) 09/18/2017 0052    Inpatient Medications  Scheduled Meds: . enoxaparin (LOVENOX) injection  40 mg Subcutaneous QHS  . insulin aspart  0-9 Units Subcutaneous TID WC  . insulin glargine  10 Units Subcutaneous QHS  . lidocaine  1 patch Transdermal Q24H  . metoprolol tartrate  50 mg Oral BID  . ramipril  10 mg Oral Daily  . simvastatin  40 mg Oral QHS  . vitamin B-12  1,000 mcg Oral Daily   Continuous Infusions: . cefTRIAXone (ROCEPHIN)  IV 1 g (12/07/18 2236)   PRN Meds:.acetaminophen **OR** acetaminophen, hydrALAZINE, nitroGLYCERIN, ondansetron **OR** ondansetron (ZOFRAN) IV  Micro Results Recent Results (from the past 240 hour(s))  Blood culture (routine x 2)     Status: None (Preliminary result)   Collection Time: 12/06/18  4:25 PM  Result Value Ref Range Status   Specimen Description BLOOD RIGHT HAND  Final   Special Requests   Final    BOTTLES DRAWN AEROBIC AND ANAEROBIC Blood Culture results may not be optimal due to an inadequate volume of blood received in culture bottles   Culture   Final    NO GROWTH 2 DAYS Performed at [facility], [place] [address], [place], [place] [address]    Report Status PENDING  Incomplete  Urine culture     Status: Abnormal (Preliminary result)   Collection Time: 12/06/18  8:01 PM  Result Value Ref Range Status   Specimen Description URINE, RANDOM  Final   Special Requests   Final    NONE Performed at [facility], [place] [address], [place], [place] [address]    Culture >=100,000 COLONIES/mL ESCHERICHIA COLI (A)  Final   Report Status PENDING  Incomplete  Blood culture (routine x 2)     Status: None (Preliminary result)   Collection Time: 12/06/18 11:15 PM  Result Value Ref Range Status    Specimen Description BLOOD LEFT HAND  Final   Special Requests   Final    BOTTLES DRAWN AEROBIC ONLY Blood Culture results may not be optimal due to an inadequate volume of blood received in culture bottles   Culture   Final    NO GROWTH 1 DAY Performed at [facility], [place] [address] [place], [place] [address]    Report Status PENDING  Incomplete    Radiology Reports Ct Head Wo Contrast  Result Date: 12/06/2018 CLINICAL DATA:  Altered mental status EXAM: CT HEAD WITHOUT CONTRAST TECHNIQUE: Contiguous axial images were obtained from the base of the skull through the vertex without intravenous contrast. COMPARISON:  09/18/2017 FINDINGS: Brain: Mild atrophic and chronic white matter ischemic changes are seen. The overall appearance is stable from the prior study. No findings to suggest acute hemorrhage, acute infarction or space-occupying mass lesion are seen. Vascular: No hyperdense vessel or unexpected calcification. Skull: Normal. Negative for fracture or focal lesion. Sinuses/Orbits: No acute finding. Other: None. IMPRESSION: Chronic atrophic and ischemic  changes without acute abnormality. Electronically Signed   By: Inez Catalina M.D.   On: 12/06/2018 19:31   Ct Angio Chest Pe W Or Wo Contrast  Result Date: 12/07/2018 CLINICAL DATA:  73 y/o  F; PE suspected, high pretest prob. EXAM: CT ANGIOGRAPHY CHEST WITH CONTRAST TECHNIQUE: Multidetector CT imaging of the chest was performed using the standard protocol during bolus administration of intravenous contrast. Multiplanar CT image reconstructions and MIPs were obtained to evaluate the vascular anatomy. CONTRAST:  64 cc Isovue 370 COMPARISON:  10/03/2018 CT chest. FINDINGS: Cardiovascular: 4.2 cm ascending aortic aneurysm. Enlarged main pulmonary artery measuring 3.7 cm. Cardiomegaly. Severe coronary artery calcific atherosclerosis. Satisfactory opacification of the pulmonary arteries. No pulmonary embolus identified. Mediastinum/Nodes: Thyroid  goiter with multiple small nodules. No mediastinal or axillary lymphadenopathy. Normal thoracic esophagus. Lungs/Pleura: Platelike atelectasis in the lung bases. No consolidation. No pleural effusion or pneumothorax. Upper Abdomen: No acute abnormality. Musculoskeletal: Multiple chronic bilateral anterior rib fractures Review of the MIP images confirms the above findings. IMPRESSION: 1. No pulmonary embolus identified. 2. Enlarged main pulmonary artery may represent pulmonary artery hypertension. Stable cardiomegaly. 3. 4.2 cm ascending aortic aneurysm. Recommend annual imaging followup by CTA or MRA. This recommendation follows 2010 ACCF/AHA/AATS/ACR/ASA/SCA/SCAI/SIR/STS/SVM Guidelines for the Diagnosis and Management of Patients with Thoracic Aortic Disease. 2010; 121: J478-G956. 4. Thyroid goiter with multiple small nodules. 5. Platelike atelectasis in the lung Electronically Signed   By: Kristine Garbe M.D.   On: 12/07/2018 03:06   Dg Chest Portable 1 View  Result Date: 12/06/2018 CLINICAL DATA:  Tachycardia.  Confusion.  Suspect sepsis. EXAM: PORTABLE CHEST 1 VIEW COMPARISON:  12/04/2018 FINDINGS: The heart is moderately enlarged. Normal vascularity. Subsegmental atelectasis at the left base. No pneumothorax. No consolidation. IMPRESSION: Cardiomegaly without decompensation. Electronically Signed   By: Marybelle Killings M.D.   On: 12/06/2018 17:19   Xr Thoracic Spine 2 View  Result Date: 12/04/2018 X-rays thoracic spine: She has vertebroplasty changes at T8, 11 and 12 which are unchanged compared to CT scan from October 03, 2018.  There is a T7 compression fracture which was not treated with vertebroplasty but appears to be unchanged from November 2019.  I do not see any new compression deformities.     Time Spent in minutes  25   Karlyn Glasco M.D on 12/08/2018 at 3:37 PM  Between 7am to 7pm - Pager - [phone]  After 7pm go to [email or web address] - password [facility]  [facility] -   Office  [phone]

## 2018-12-08 NOTE — Evaluation (Signed)
Occupational Therapy Evaluation Patient Details Name: [name] MRN: [id number] DOB: 09-15-1946 Today's Date: 12/08/2018    History of Present Illness Patient is a 73 y/o female admitted 12/06/2017 after being found on the bed confused, incontinent of urine and bowel by EMS.  History of L upper thoracic pain for 1 week, PCP found no acute compression fractures on 12/04/18.  Per internal medicine suspect baclofen induced seizure/sedation. PMH: anxiety, arthritis, CAD, HTN, MI, SVT, DM type 2, kyphoplasty x 2, ORIF L hip.    Clinical Impression   PTA patient independent with ADLs and mobility, using RW. Patient currently admitted for above and limited by problem list below, including: generalized weakness, decreased activity tolerance, pain, and impaired balance.  Patient oriented, requires cueing for sequencing transfers, and presents with decreased awareness of assist needed as patient is determined to dc home and reports "the lord will help me, I am going home today". Patient requires min assist for UB ADLs, mod assist for LB ADLs and min to mod assist for transfers (mod assist from regular height bed).  Patient declines standing at sink for ADLs, and limited mobility during session approx 5 feet to sink only.  RN reports patient has a neighbor who assist her, but based on performance today if patient does not have assist at home for transfers and ADLs she will need SNF rehab.  Will continue to follow.     Follow Up Recommendations  Home health OT;SNF(if care can be verified for ADLs/transfers, HHOT)    Equipment Recommendations  None recommended by OT    Recommendations for Other Services PT consult     Precautions / Restrictions Precautions Precautions: Fall Restrictions Weight Bearing Restrictions: No      Mobility Bed Mobility Overal bed mobility: Needs Assistance Bed Mobility: Supine to Sit     Supine to sit: Min assist     General bed mobility comments: min assist to  ascend trunk, HOB elevated and cueing for use of rails but patient required min assist to ascend completely  Transfers Overall transfer level: Needs assistance Equipment used: Rolling walker (2 wheeled) Transfers: Sit to/from [facility] Sit to Stand: Mod assist;Min assist(min assist from elevated surface, mod from regular surface) Stand pivot transfers: Mod assist       General transfer comment: min assist from elevated EOB, but requires mod assist from regular height bed; cueing for hand placement and foward lean     Balance Overall balance assessment: Needs assistance Sitting-balance support: No upper extremity supported;Feet supported Sitting balance-Leahy Scale: Fair     Standing balance support: Bilateral upper extremity supported;During functional activity Standing balance-Leahy Scale: Fair Standing balance comment: reliant on B UE support                           ADL either performed or assessed with clinical judgement   ADL Overall ADL's : Needs assistance/impaired     Grooming: Set up;Sitting   Upper Body Bathing: Sitting;Minimal assistance   Lower Body Bathing: Minimal assistance;Sit to/from stand Lower Body Bathing Details (indicate cue type and reason): decreased reach to B LEs Upper Body Dressing : Minimal assistance;Sitting Upper Body Dressing Details (indicate cue type and reason): pain with increased ROM at L shoudler  Lower Body Dressing: Sit to/from stand;Moderate assistance Lower Body Dressing Details (indicate cue type and reason): decreased reach to feet Toilet Transfer: Moderate assistance;Stand-pivot;RW Toilet Transfer Details (indicate cue type and reason): simulated to recliner Toileting-  Clothing Manipulation and Hygiene: Minimal assistance;Sit to/from stand       Functional mobility during ADLs: Minimal assistance;Rolling walker;Cueing for safety General ADL Comments: patient with limited activity tolerance, once  standing eager to return to sitting position     Vision   Vision Assessment?: No apparent visual deficits     Perception     Praxis      Pertinent Vitals/Pain Pain Assessment: Faces Faces Pain Scale: Hurts little more Pain Location: L ribs  Pain Descriptors / Indicators: Discomfort;Grimacing Pain Intervention(s): Monitored during session;Repositioned     Hand Dominance Right   Extremity/Trunk Assessment Upper Extremity Assessment Upper Extremity Assessment: LUE deficits/detail;RUE deficits/detail RUE Deficits / Details: grossly 4/5, WFL  LUE Deficits / Details: grossly 3+/5, limited ROM at shoulder due to pain    Lower Extremity Assessment Lower Extremity Assessment: Defer to PT evaluation   Cervical / Trunk Assessment Cervical / Trunk Assessment: Other exceptions Cervical / Trunk Exceptions: rounded shoulder foward head (reports decreases pain)    Communication Communication Communication: No difficulties   Cognition Arousal/Alertness: Awake/alert Behavior During Therapy: WFL for tasks assessed/performed Overall Cognitive Status: Within Functional Limits for tasks assessed                                 General Comments: slight decreased awareness    General Comments       Exercises     Shoulder Instructions      Home Living Family/patient expects to be discharged to:: Private residence Living Arrangements: Alone Available Help at Discharge: Friend(s) Type of Home: Mobile home Home Access: Ramped entrance     Home Layout: One level     Bathroom Shower/Tub: Tub/shower unit(basin baths )   Bathroom Toilet: Handicapped height     Home Equipment: Environmental consultant - 4 wheels          Prior Functioning/Environment Level of Independence: Independent with assistive device(s)        Comments: uses walker for mobility, has assist with yardwork, cleaning; able to complete minimal cooking, independent medication and finances          OT  Problem List: Decreased strength;Decreased activity tolerance;Impaired balance (sitting and/or standing);Decreased safety awareness;Decreased knowledge of use of DME or AE;Decreased knowledge of precautions;Pain      OT Treatment/Interventions: Self-care/ADL training;Therapeutic exercise;DME and/or AE instruction;Therapeutic activities;Patient/family education;Balance training    OT Goals(Current goals can be found in the care plan section) Acute Rehab OT Goals Patient Stated Goal: home today  OT Goal Formulation: With patient Time For Goal Achievement: 12/22/18 Potential to Achieve Goals: Good  OT Frequency: Min 2X/week   Barriers to D/C: Decreased caregiver support          Co-evaluation              AM-PAC OT "6 Clicks" Daily Activity     Outcome Measure Help from another person eating meals?: None Help from another person taking care of personal grooming?: None(seated) Help from another person toileting, which includes using toliet, bedpan, or urinal?: A Little Help from another person bathing (including washing, rinsing, drying)?: A Lot Help from another person to put on and taking off regular upper body clothing?: A Little Help from another person to put on and taking off regular lower body clothing?: A Lot 6 Click Score: 18   End of Session Equipment Utilized During Treatment: Gait belt;Rolling walker Nurse Communication: Mobility status  Activity Tolerance: Patient tolerated  treatment well Patient left: in chair;with call bell/phone within reach  OT Visit Diagnosis: Other abnormalities of gait and mobility (R26.89);Muscle weakness (generalized) (M62.81);Pain Pain - Right/Left: Left Pain - part of body: (thoracic area)                Time: 8786-7672 OT Time Calculation (min): 27 min Charges:  OT General Charges $OT Visit: 1 Visit OT Evaluation $OT Eval Moderate Complexity: 1 Mod OT Treatments $Self Care/Home Management : 8-22 mins  Delight Stare,  OT Acute Rehabilitation Services Pager [phone] Office [phone]   Delight Stare 12/08/2018, 12:51 PM

## 2018-12-08 NOTE — Progress Notes (Signed)
  Echocardiogram 2D Echocardiogram has been performed.  Matilde Bash 12/08/2018, 2:55 PM

## 2018-12-08 NOTE — Progress Notes (Signed)
P.t. returned to 3w from echo.

## 2018-12-08 NOTE — Evaluation (Signed)
Physical Therapy Evaluation Patient Details Name: [name] MRN: [id number] DOB: 10/14/1946 Today's Date: 12/08/2018   History of Present Illness  Patient is a 73 y/o female admitted 12/06/2017 after being found on the bed confused, incontinent of urine and bowel by EMS.  History of L upper thoracic pain for 1 week, PCP found no acute compression fractures on 12/04/18.  Per internal medicine suspect baclofen induced seizure/sedation. PMH: anxiety, arthritis, CAD, HTN, MI, SVT, DM type 2, kyphoplasty x 2, ORIF L hip.   Clinical Impression  Orders received for PT evaluation. Patient demonstrates deficits in functional mobility as indicated below. Will benefit from continued skilled PT to address deficits and maximize function. Will see as indicated and progress as tolerated.  Patient reluctant to participate in assessment. Required increased physical assist for basic tasks such as transfers and bed mobility. Patient determined to return home today regardless of physical abilities. When attempting to educate patient on safety, patient becoming increasingly agitated. Upon return to bed, patient demanding physical assist. Therapist attempting to move linens and blankets aside to assist patient back to bed and patient became frustrated and raised fists with attempt to strike therapist. Therapist requested mutual respect and advised patient that she would not assist patient if patient remained in aggressive state. Patient then labile and apologetic. At this time, given need for assist feel patient would require assist for transfers and mobility. If patient does not have this support in place, feel it would put the patient at significant risk for falls and inability to care for herself. At this time, recommend ST SNF upon acute discharge, however, anticipate patient will decline this option and therefore will need HHPT and as much available resources to ensure safety upon discharge.    Follow Up  Recommendations SNF;Supervision/Assistance - 24 hour    Equipment Recommendations  None recommended by PT    Recommendations for Other Services       Precautions / Restrictions Precautions Precautions: Fall Restrictions Weight Bearing Restrictions: No      Mobility  Bed Mobility Overal bed mobility: Needs Assistance Bed Mobility: Sit to Supine       Sit to supine: Min assist   General bed mobility comments: min assist to elevate LEs back to bed, during attempt patient developped frustration and agitation attempting to hit therapist  Transfers Overall transfer level: Needs assistance Equipment used: Rolling walker (2 wheeled) Transfers: Sit to/from [facility] Sit to Stand: Mod assist(min assist from elevated surface, mod from regular surface)         General transfer comment: Moderate assist to elevate from recliner x2. patient unable to power up to standing on her own. Does report use of lift chair at home  Ambulation/Gait Ambulation/Gait assistance: Min guard;Min assist Gait Distance (Feet): 90 Feet Assistive device: Rolling walker (2 wheeled) Gait Pattern/deviations: Step-through pattern;Decreased stride length;Wide base of support;Trunk flexed Gait velocity: decreased Gait velocity interpretation: <1.31 ft/sec, indicative of household ambulator General Gait Details: 2 standing rest breaks, one seated rest break. Heavy reliance on encouragement and RW  Stairs            Wheelchair Mobility    Modified Rankin (Stroke Patients Only)       Balance Overall balance assessment: Needs assistance Sitting-balance support: No upper extremity supported;Feet supported Sitting balance-Leahy Scale: Fair     Standing balance support: Bilateral upper extremity supported;During functional activity Standing balance-Leahy Scale: Fair Standing balance comment: reliant on B UE support  Pertinent Vitals/Pain  Pain Assessment: Faces Faces Pain Scale: Hurts even more Pain Location: L ribs  Pain Descriptors / Indicators: Discomfort;Grimacing Pain Intervention(s): Monitored during session    Home Living Family/patient expects to be discharged to:: Private residence Living Arrangements: Alone Available Help at Discharge: Friend(s) Type of Home: Mobile home Home Access: Ramped entrance     Home Layout: One level Home Equipment: Environmental consultant - 4 wheels      Prior Function Level of Independence: Independent with assistive device(s)         Comments: uses walker for mobility, has assist with yardwork, cleaning; able to complete minimal cooking, independent medication and finances       Hand Dominance   Dominant Hand: Right    Extremity/Trunk Assessment   Upper Extremity Assessment Upper Extremity Assessment: Defer to OT evaluation    Lower Extremity Assessment Lower Extremity Assessment: Generalized weakness(deferred formal assessment due to pt reluctance)    Cervical / Trunk Assessment Cervical / Trunk Assessment: Other exceptions Cervical / Trunk Exceptions: rounded shoulder foward head (reports decreases pain)   Communication   Communication: No difficulties  Cognition Arousal/Alertness: Awake/alert Behavior During Therapy: WFL for tasks assessed/performed Overall Cognitive Status: Impaired/Different from baseline Area of Impairment: Safety/judgement;Awareness;Problem solving                         Safety/Judgement: Decreased awareness of safety;Decreased awareness of deficits Awareness: Anticipatory Problem Solving: Requires tactile cues;Requires verbal cues;Slow processing General Comments: patient with noted frustration when asked to perform tasks as if she were at home, demanding assist but stating she can go home without assist. Poor insight into deficits and mobility.      General Comments      Exercises     Assessment/Plan    PT Assessment Patient  needs continued PT services  PT Problem List Decreased strength;Decreased activity tolerance;Decreased balance;Decreased mobility;Decreased cognition;Decreased safety awareness;Pain       PT Treatment Interventions DME instruction;Gait training;Functional mobility training;Therapeutic activities;Therapeutic exercise;Balance training;Neuromuscular re-education;Cognitive remediation;Patient/family education    PT Goals (Current goals can be found in the Care Plan section)  Acute Rehab PT Goals Patient Stated Goal: home today  PT Goal Formulation: With patient Time For Goal Achievement: 12/22/18 Potential to Achieve Goals: Fair    Frequency Min 3X/week   Barriers to discharge Decreased caregiver support      Co-evaluation               AM-PAC PT "6 Clicks" Mobility  Outcome Measure Help needed turning from your back to your side while in a flat bed without using bedrails?: A Lot Help needed moving from lying on your back to sitting on the side of a flat bed without using bedrails?: A Lot Help needed moving to and from a bed to a chair (including a wheelchair)?: A Lot Help needed standing up from a chair using your arms (e.g., wheelchair or bedside chair)?: A Lot Help needed to walk in hospital room?: A Little Help needed climbing 3-5 steps with a railing? : A Lot 6 Click Score: 13    End of Session Equipment Utilized During Treatment: Gait belt Activity Tolerance: Patient limited by fatigue Patient left: in bed;with call bell/phone within reach(with transport) Nurse Communication: Mobility status(agitation) PT Visit Diagnosis: Unsteadiness on feet (R26.81);Difficulty in walking, not elsewhere classified (R26.2);Muscle weakness (generalized) (M62.81)    Time: 9924-2683 PT Time Calculation (min) (ACUTE ONLY): 20 min   Charges:   PT Evaluation $PT Eval  Moderate Complexity: 1 Mod          Alben Deeds, PT DPT  Board Certified Neurologic Specialist Acute  Rehabilitation Services Pager [phone] Office [phone]   Duncan Dull 12/08/2018, 3:03 PM

## 2018-12-08 NOTE — Progress Notes (Signed)
Pt transported to [facility]. 

## 2018-12-09 ENCOUNTER — Observation Stay (HOSPITAL_COMMUNITY): Payer: Medicare Other

## 2018-12-09 DIAGNOSIS — G9340 Encephalopathy, unspecified: Secondary | ICD-10-CM | POA: Diagnosis not present

## 2018-12-09 DIAGNOSIS — B9620 Unspecified Escherichia coli [E. coli] as the cause of diseases classified elsewhere: Secondary | ICD-10-CM | POA: Diagnosis not present

## 2018-12-09 DIAGNOSIS — F05 Delirium due to known physiological condition: Secondary | ICD-10-CM | POA: Diagnosis not present

## 2018-12-09 DIAGNOSIS — N390 Urinary tract infection, site not specified: Secondary | ICD-10-CM | POA: Diagnosis not present

## 2018-12-09 LAB — GLUCOSE, CAPILLARY
GLUCOSE-CAPILLARY: 205 mg/dL — AB (ref 70–99)
GLUCOSE-CAPILLARY: 131 mg/dL — AB (ref 70–99)
Glucose-Capillary: 177 mg/dL — ABNORMAL HIGH (ref 70–99)
Glucose-Capillary: 138 mg/dL — ABNORMAL HIGH (ref 70–99)
Glucose-Capillary: 237 mg/dL — ABNORMAL HIGH (ref 70–99)

## 2018-12-09 LAB — URINE CULTURE: Culture: >=100000 — AB

## 2018-12-09 MED ORDER — CEPHALEXIN 500 MG PO CAPS
500.0000 mg | ORAL_CAPSULE | Freq: Two times a day (BID) | ORAL | Status: DC
  Administered 2018-12-09 – 2018-12-11 (×5): 500 mg via ORAL
  Filled 2018-12-09 (×5): qty 1

## 2018-12-09 MED ORDER — HALOPERIDOL LACTATE 5 MG/ML IJ SOLN
1.0000 mg | Freq: Four times a day (QID) | INTRAMUSCULAR | Status: DC | PRN
  Administered 2018-12-09: 1 mg via INTRAMUSCULAR
  Filled 2018-12-09: qty 1

## 2018-12-09 MED ORDER — HALOPERIDOL LACTATE 5 MG/ML IJ SOLN: 1.0000 mg | Freq: Four times a day (QID) | INTRAMUSCULAR | Status: DC | PRN

## 2018-12-09 MED ORDER — QUETIAPINE FUMARATE 25 MG PO TABS
25.0000 mg | ORAL_TABLET | Freq: Every day | ORAL | Status: DC
  Administered 2018-12-09 – 2018-12-10 (×2): 25 mg via ORAL
  Filled 2018-12-09 (×2): qty 1

## 2018-12-09 NOTE — Clinical Social Work Note (Signed)
Clinical Social Work Assessment  Patient Details  Name: [name] MRN: [id number] Date of Birth: Feb 06, 1946  Date of referral:  12/09/18               Reason for consult:  Discharge Planning                Permission sought to share information with:  Case Manager Permission granted to share information::  Yes, Verbal Permission Granted  Name::        Agency::  Denying SNF at this time  Relationship::     Contact Information:     Housing/Transportation Living arrangements for the past 2 months:  Single Family Home Source of Information:  Patient Patient Interpreter Needed:  None Criminal Activity/Legal Involvement Pertinent to Current Situation/Hospitalization:  No - Comment as needed Significant Relationships:  Friend Lives with:  Self Do you feel safe going back to the place where you live?  Yes Need for family participation in patient care:  No (Coment)  Care giving concerns:    CSW was consulted to meet with the patient. Patient was alert and oriented. Patient was very kind and sweet. Talked with the CSW about her home and how she wanted to go back there. She is currently not happy in the hospital. She is denying SNF at this time and would like to pursue home health. No other concerns were reported.    Social Worker assessment / plan:    CSW completed Fl2 and assessment at bedside with the patient. Patient currently wants to pursue home health resources. She does not want to go to a facility. She enjoys the comfort of her home. CSW referred RNCM to assist the patient with coordinating home health services.   Employment status:  Retired Nurse, adult PT Recommendations:  [place] / Referral to community resources:  [place]  Patient/Family's Response to care:  Patient is aware of current medical condition and seems to know what is best for her.   Patient/Family's Understanding of and Emotional  Response to Diagnosis, Current Treatment, and Prognosis:  Patient is aware of her health condition and wants to return home as soon as she can with the appropriate resources.   Emotional Assessment Appearance:  Appears stated age Attitude/Demeanor/Rapport:  Unable to Assess Affect (typically observed):  Stable Orientation:  Oriented to Self, Oriented to  Time, Oriented to Place, Oriented to Situation Alcohol / Substance use:  Not Applicable Psych involvement (Current and /or in the community):  No (Comment)  Discharge Needs  Concerns to be addressed:  Patient refuses services Readmission within the last 30 days:  No Current discharge risk:  Lives alone, Dependent with Mobility Barriers to Discharge:  Continued Medical Work up   [employer], [place] 12/09/2018, 3:42 PM

## 2018-12-09 NOTE — Progress Notes (Signed)
CSW spoke with patient. Patient was very sweet and patient. At this time patient is denying SNF and wants to return home with home health.   CSW will refer to [facility] to coordinate home health services.   Domenic Schwab, MSW, [place]

## 2018-12-09 NOTE — Progress Notes (Signed)
Patient is confused, verbally abusive towards staff members, and  believes that she is at home.    Removed IV, patient bleeding  RN & NT cleaned patient and room to removed blood

## 2018-12-09 NOTE — Progress Notes (Signed)
Occupational Therapy Treatment Patient Details Name: [name] MRN: [id number] DOB: 1946-10-16 Today's Date: 12/09/2018    History of present illness Patient is a 73 y/o female admitted 12/06/2017 after being found on the bed confused, incontinent of urine and bowel by EMS.  History of L upper thoracic pain for 1 week, PCP found no acute compression fractures on 12/04/18.  Per internal medicine suspect baclofen induced seizure/sedation. PMH: anxiety, arthritis, CAD, HTN, MI, SVT, DM type 2, kyphoplasty x 2, ORIF L hip.    OT comments  Patient willing to participate with OT.  Continues to require mod assist to stand from EOB or recliner, min assist from elevated 3:1 commode. Short blessed test completed, scoring 4/28 normal cognition.  Patient educated on importance of 24/7 assistance, patient reporting "I'm good, I have a lift chair and a high bed at home". Patient with limited awareness of deficits and physical assist need to dc home.  Continue to recommend SNF, but patient declining.    Follow Up Recommendations  SNF;Home health OT;Supervision/Assistance - 24 hour(pt declining SNF, will need maximal HH services)    Equipment Recommendations  3 in 1 bedside commode    Recommendations for Other Services      Precautions / Restrictions Precautions Precautions: Fall Restrictions Weight Bearing Restrictions: No       Mobility Bed Mobility Overal bed mobility: Needs Assistance Bed Mobility: Sit to Supine     Supine to sit: Min assist     General bed mobility comments: min assist to elevate LEs back to bed, during attempt patient developped frustration and agitation attempting to hit therapist  Transfers Overall transfer level: Needs assistance Equipment used: Rolling walker (2 wheeled) Transfers: Sit to/from [facility] Sit to Stand: Mod assist;Min assist(min assist from elevated surface) Stand pivot transfers: Mod assist;Min assist(min assist from elevated  surface)       General transfer comment: requires increased assist from EOB and recliner, min assist from elevated BSC; increased time and effort    Balance Overall balance assessment: Needs assistance Sitting-balance support: No upper extremity supported;Feet supported Sitting balance-Leahy Scale: Fair     Standing balance support: Single extremity supported;During functional activity Standing balance-Leahy Scale: Fair Standing balance comment: 1 handed support during toileting with min guard                            ADL either performed or assessed with clinical judgement   ADL Overall ADL's : Needs assistance/impaired                     Lower Body Dressing: Moderate assistance;Sit to/from stand Lower Body Dressing Details (indicate cue type and reason): requires total assist to don socks, min guard in standing and anticipate able to manage clothing over hips  Toilet Transfer: Moderate assistance;Minimal assistance;Stand-pivot;BSC Toilet Transfer Details (indicate cue type and reason): mod assist to ascend from recliner, but min assist from elevated BSC  Toileting- Clothing Manipulation and Hygiene: Minimal assistance;Sit to/from stand Toileting - Clothing Manipulation Details (indicate cue type and reason): encouargement to complete hygiene with min assist for clothing mgmt        General ADL Comments: pt with limited tolerance to standing and ADl activity     Vision   Vision Assessment?: No apparent visual deficits   Perception     Praxis      Cognition Arousal/Alertness: Awake/alert Behavior During Therapy: WFL for tasks assessed/performed Overall Cognitive  Status: Impaired/Different from baseline Area of Impairment: Safety/judgement;Awareness;Problem solving                         Safety/Judgement: Decreased awareness of safety;Decreased awareness of deficits Awareness: Anticipatory Problem Solving: Requires tactile  cues;Requires verbal cues;Slow processing General Comments: short blessed test: 4/28= normal cognition, but continues to be limited by safety awareness and limited awareness to deficits         Exercises     Shoulder Instructions       General Comments      Pertinent Vitals/ Pain       Pain Assessment: Faces Faces Pain Scale: Hurts even more Pain Location: L ribs  Pain Descriptors / Indicators: Discomfort;Grimacing Pain Intervention(s): Monitored during session;Repositioned  Home Living                                          Prior Functioning/Environment              Frequency  Min 2X/week        Progress Toward Goals  OT Goals(current goals can now be found in the care plan section)  Progress towards OT goals: Progressing toward goals  Acute Rehab OT Goals Patient Stated Goal: home today  OT Goal Formulation: With patient Time For Goal Achievement: 12/22/18 Potential to Achieve Goals: Good  Plan Discharge plan remains appropriate;Frequency remains appropriate    Co-evaluation                 AM-PAC OT "6 Clicks" Daily Activity     Outcome Measure   Help from another person eating meals?: None Help from another person taking care of personal grooming?: None(seated) Help from another person toileting, which includes using toliet, bedpan, or urinal?: A Little Help from another person bathing (including washing, rinsing, drying)?: A Lot Help from another person to put on and taking off regular upper body clothing?: A Little Help from another person to put on and taking off regular lower body clothing?: A Lot 6 Click Score: 18    End of Session Equipment Utilized During Treatment: Gait belt;Rolling walker  OT Visit Diagnosis: Other abnormalities of gait and mobility (R26.89);Muscle weakness (generalized) (M62.81);Pain Pain - Right/Left: Left Pain - part of body: (ribs)   Activity Tolerance Patient tolerated treatment well    Patient Left in chair;with call bell/phone within reach;with chair alarm set   Nurse Communication Mobility status        Time: 4235-3614 OT Time Calculation (min): 26 min  Charges: OT General Charges $OT Visit: 1 Visit OT Treatments $Self Care/Home Management : 23-37 mins  Delight Stare, [place] Pager [phone] Office [phone]    Delight Stare 12/09/2018, 5:36 PM

## 2018-12-09 NOTE — NC FL2 (Signed)
[place] LEVEL OF CARE SCREENING TOOL     IDENTIFICATION  Patient Name: [name] Birthdate: 1946/06/19 Sex: female Admission Date (Current Location): 12/06/2018  [facility] and [place] Number:  Herbalist and Address:  [facility] [place]. [facility], [place] [address] [place], [place] [address]      Provider Number: [id number]  Attending Physician Name and Address:  Louellen Molder, MD  Relative Name and Phone Number:  [name] [phone]    Current Level of Care: Hospital Recommended Level of Care: [place] Prior Approval Number:    Date Approved/Denied: 01/17/13 PASRR Number: [id number]  Discharge Plan: SNF    Current Diagnoses: Patient Active Problem List   Diagnosis Date Noted  . Acute encephalopathy 12/06/2018  . Uncontrolled type 2 diabetes mellitus with hyperglycemia ([place]) 12/06/2018  . Acute lower UTI 12/06/2018  . Adjustment disorder with anxious mood 05/31/2016  . Tibia fracture after fall-s/p intramedulaary nailing 05/26/16 05/26/2016  . 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 Epiretinal membrane (ERM) of both eyes 01/26/2016  . Orthostatic hypotension   . Syncope and collapse 07/18/2015  . Compression fracture 07/15/2015  . Sepsis ([place]) 12/05/2014  . Constipation 12/04/2014  . Hematuria   . Intertrochanteric fracture of left hip ([place]) 11/14/2014  . Type 2 diabetes mellitus without complication ([facility])   . 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 Contusion of right leg 11/13/2013  . HTN (hypertension) 11/12/2013  . Fall 11/12/2013  . CAD -S/P PCI Feb 2014 01/11/2013  . Oral thrush 01/11/2013  . Hematochezia 01/08/2013  . PSVT-post op 05/26/16 01/05/2013  . History of NSTEMI Feb 2014 01/05/2013  . Hypokalemia 01/04/2013  . Fever 01/04/2013  . H/O vertebroplasty 01/04/2013  . Proliferative diabetic retinopathy ([place]) 08/15/2012  . Pseudoaphakia 08/15/2012  . Cellulitis and abscess of foot 03/31/2012   . Dehydration 03/31/2012  . Hyponatremia 03/31/2012    Orientation RESPIRATION BLADDER Height & Weight     Self, Time, Situation, Place  Normal Continent Weight: 240 lb 8.4 oz (109.1 kg) Height:     BEHAVIORAL SYMPTOMS/MOOD NEUROLOGICAL BOWEL NUTRITION STATUS      Continent Diet(Cardiac Heart Healthy)  AMBULATORY STATUS COMMUNICATION OF NEEDS Skin   Independent Verbally Normal(Dry)                       Personal Care Assistance Level of Assistance  Bathing, Feeding, Dressing, Total care Bathing Assistance: Maximum assistance Feeding assistance: Independent Dressing Assistance: Maximum assistance Total Care Assistance: Maximum assistance   Functional Limitations Info  Sight, Hearing, Speech Sight Info: Adequate Hearing Info: Impaired Speech Info: Adequate    SPECIAL CARE FACTORS FREQUENCY  PT (By licensed PT), OT (By licensed OT)     PT Frequency: 5x/wk OT Frequency: 5x/wk            Contractures Contractures Info: Not present    Additional Factors Info  Code Status, Psychotropic, Insulin Sliding Scale Code Status Info: Full Code   Psychotropic Info: seroquel 25mg  at bedtime Insulin Sliding Scale Info: insulin aspart novolog 0-9 units, 3x daily w/meals & insulin glargine (lantus) 15 units @ bedtime       Current Medications (12/09/2018):  This is the current hospital active medication list Current Facility-Administered Medications  Medication Dose Route Frequency Provider Last Rate Last Dose  . 0.9 %  sodium chloride infusion   Intravenous PRN Dhungel, Nishant, MD 10 mL/hr at 12/08/18 2215 250 mL at 12/08/18 2215  . acetaminophen (TYLENOL) tablet  650 mg  650 mg Oral Q6H PRN Rise Patience, MD   650 mg at 12/07/18 2240   Or  . acetaminophen (TYLENOL) suppository 650 mg  650 mg Rectal Q6H PRN Rise Patience, MD      . cephALEXin (KEFLEX) capsule 500 mg  500 mg Oral Q12H Dhungel, Nishant, MD      . enoxaparin (LOVENOX) injection 40 mg  40 mg  Subcutaneous QHS Rise Patience, MD   40 mg at 12/08/18 2209  . furosemide (LASIX) tablet 40 mg  40 mg Oral Daily Dhungel, Nishant, MD   40 mg at 12/09/18 1014  . haloperidol lactate (HALDOL) injection 1 mg  1 mg Intramuscular Q6H PRN Dhungel, Nishant, MD      . hydrALAZINE (APRESOLINE) injection 5 mg  5 mg Intravenous Q4H PRN Rise Patience, MD      . insulin aspart (novoLOG) injection 0-9 Units  0-9 Units Subcutaneous TID WC Rise Patience, MD   1 Units at 12/09/18 [id number]  . insulin glargine (LANTUS) injection 15 Units  15 Units Subcutaneous QHS Dhungel, Nishant, MD   15 Units at 12/08/18 2333  . lidocaine (LIDODERM) 5 % 1 patch  1 patch Transdermal Q24H [place] Shi, MD   1 patch at 12/08/18 1945  . metoprolol tartrate (LOPRESSOR) tablet 50 mg  50 mg Oral BID Rise Patience, MD   50 mg at 12/09/18 1013  . nitroGLYCERIN (NITROSTAT) SL tablet 0.4 mg  0.4 mg Sublingual Q5 min PRN Rise Patience, MD      . ondansetron [facility]) tablet 4 mg  4 mg Oral Q6H PRN Rise Patience, MD       Or  . ondansetron [facility]) injection 4 mg  4 mg Intravenous Q6H PRN Rise Patience, MD      . QUEtiapine (SEROQUEL) tablet 25 mg  25 mg Oral QHS Dhungel, Nishant, MD      . ramipril (ALTACE) capsule 10 mg  10 mg Oral Daily Rise Patience, MD   10 mg at 12/09/18 1013  . simvastatin (ZOCOR) tablet 40 mg  40 mg Oral QHS Rise Patience, MD   40 mg at 12/08/18 2209  . vitamin B-12 (CYANOCOBALAMIN) tablet 1,000 mcg  1,000 mcg Oral Daily Rise Patience, MD   1,000 mcg at 12/09/18 1014     Discharge Medications: Please see discharge summary for a list of discharge medications.  Relevant Imaging Results:  Relevant Lab Results:   Additional Information SSN: [id number]  Philippa Chester Myliah Medel, LCSWA

## 2018-12-09 NOTE — Progress Notes (Signed)
PROGRESS NOTE                                                                                                                                                                                                             Patient Demographics:    [name], is a 73 y.o. female, DOB - September 29, 1946, KAJ:[id number]  Admit date - 12/06/2018   Admitting Physician Rise Patience, MD  Outpatient Primary MD for the patient is Shirline Frees, MD  LOS - 0  Outpatient Specialists: None  Chief Complaint  Patient presents with  . Altered Mental Status       Brief Narrative   73 year old female with history of CAD status post stenting in 0355, chronic diastolic CHF, diabetes mellitus type 2, hypertension, hyperlipidemia, thoracic kyphoplasty who was having left sided chest pain underneath her left breast for past 1 week.  She went to her PCP few days back and had x-rays done which was negative for any fracture.  She was prescribed meloxicam and baclofen.  Patient lives alone and a friend called EMS who  found her to be confused and incontinent with urine and bowel.  She was brought to the ED. Head CT unremarkable.  Chest x-ray without any infiltrate.  UA was suggestive of UTI.   Subjective:   Patient agitated last night, hitting at the nursing staff and refusing to take meds, pulling out IV line. This morning I saw her after returning from ultrasound and she is calm.  Refused any IV meds.  Agreed to take p.o.  Assessment  & Plan :    Principal Problem:   Acute metabolic encephalopathy Possibly secondary to UTI and somnolence with baclofen.  Has been sundowning since yesterday. Urine culture growing pansensitive E. coli.  Rocephin switched to p.o. Keflex. We will place on PRN IM Haldol for agitation and bedtime Seroquel. Seen by PT who recommends SNF.  Active problems Left-sided chest pain. EKG on presentation showed sinus  tachycardia in the 130s.  Troponin peaked at 0.18.  CT of the chest showed old rib fractures.  Pain is reproducible on exam.   2D echo with normal EF and no wall motion abnormality.   Essential hypertension/chronic diastolic CHF/coronary artery disease Euvolemic.  Continue aspirin, beta-blocker, ACE inhibitor, Lasix and statin.  Diabetes mellitus type 2, uncontrolled with hyperglycemia CBG stable after  increasing Lantus dose, continue sliding scale coverage.  Metformin and Actos on hold.  Hypokalemia Replenished  Physical deconditioning Seen by PT/OT and recommend SNF.  Ascending aortic aneurysm measuring 4.2 cm Incidental finding on CT. Recommend annual follow up with CTA or MRI chest.  Thyroid nodule with multiple small nodules TSH normal.  Ultrasound thyroid shows large multinodular goiter.,  None of the cysts or nodules meet criteria for biopsy.    Code Status : Full code  Family Communication  : None at bedside  Disposition Plan  : Needs SNF, possibly tomorrow.  Social work consulted.  Barriers For Discharge : Active symptoms  Consults  : None  Procedures  : CT head, 2D echo, ultrasound thyroid  DVT Prophylaxis  :  Lovenox -   Lab Results  Component Value Date   PLT 195 12/07/2018    Antibiotics  :   Anti-infectives (From admission, onward)   Start     Dose/Rate Route Frequency Ordered Stop   12/06/18 2300  cefTRIAXone (ROCEPHIN) 1 g in sodium chloride 0.9 % 100 mL IVPB     1 g 200 mL/hr over 30 Minutes Intravenous Every 24 hours 12/06/18 2157     12/06/18 1700  vancomycin (VANCOCIN) 2,000 mg in sodium chloride 0.9 % 500 mL IVPB     2,000 mg 250 mL/hr over 120 Minutes Intravenous  Once 12/06/18 1628 12/06/18 2023   12/06/18 1630  piperacillin-tazobactam (ZOSYN) IVPB 3.375 g     3.375 g 100 mL/hr over 30 Minutes Intravenous  Once 12/06/18 1628 12/06/18 1852        Objective:   Vitals:   12/08/18 1952 12/08/18 2337 12/09/18 0345 12/09/18 0837  BP: (!)  186/90 (!) 143/78 (!) 163/69 (!) 162/93  Pulse: 80 82 72 85  Resp: 18 20 18 20   Temp: 98.4 F (36.9 C) 97.8 F (36.6 C) 98.2 F (36.8 C) 98 F (36.7 C)  TempSrc: Oral Oral Oral Oral  SpO2: 94% 93% 95% 96%  Weight:        Wt Readings from Last 3 Encounters:  12/08/18 109.1 kg  10/02/18 108.4 kg  06/15/16 88.5 kg     Intake/Output Summary (Last 24 hours) at 12/09/2018 1141 Last data filed at 12/09/2018 0417 Gross per 24 hour  Intake -  Output 1550 ml  Net -1550 ml    Physical exam Not in distress HEENT: Moist mucosa, supple neck Chest: Clear bilaterally CVs: Normal S1-S2, no murmurs GI: Soft, nondistended, nontender Musculoskeletal: Warm, no edema     Data Review:    CBC Recent Labs  Lab 12/06/18 1631 12/07/18 0411  WBC 8.2 10.4  HGB 15.2* 12.6  HCT 48.6* 39.4  PLT 217 195  MCV 95.9 94.3  MCH 30.0 30.1  MCHC 31.3 32.0  RDW 13.3 13.2  LYMPHSABS 0.8 1.2  MONOABS 0.4 1.5*  EOSABS 0.0 0.0  BASOSABS 0.0 0.0    Chemistries  Recent Labs  Lab 12/06/18 1631 12/07/18 0411 12/07/18 2159 12/08/18 0444  NA 138 138  --  139  K 4.4 2.9* 3.4* 3.2*  CL 102 103  --  103  CO2 23 26  --  26  GLUCOSE 202* 159*  --  122*  BUN 10 13  --  21  CREATININE 0.90 0.85  --  1.02*  CALCIUM 9.4 8.6*  --  8.9  AST 27 16  --   --   ALT 17 14  --   --   ALKPHOS 72 49  --   --  BILITOT 1.6* 0.6  --   --    ------------------------------------------------------------------------------------------------------------------ No results for input(s): CHOL, HDL, LDLCALC, TRIG, CHOLHDL, LDLDIRECT in the last 72 hours.  Lab Results  Component Value Date   HGBA1C 5.3 07/18/2015   ------------------------------------------------------------------------------------------------------------------ Recent Labs    12/06/18 2252  TSH 0.655   ------------------------------------------------------------------------------------------------------------------ No results for input(s):  VITAMINB12, FOLATE, FERRITIN, TIBC, IRON, RETICCTPCT in the last 72 hours.  Coagulation profile No results for input(s): INR, PROTIME in the last 168 hours.  No results for input(s): DDIMER in the last 72 hours.  Cardiac Enzymes Recent Labs  Lab 12/06/18 2252 12/07/18 0411 12/07/18 0955  TROPONINI 0.15* 0.18* 0.13*   ------------------------------------------------------------------------------------------------------------------    Component Value Date/Time   BNP 220.6 (H) 09/18/2017 0052    Inpatient Medications  Scheduled Meds: . enoxaparin (LOVENOX) injection  40 mg Subcutaneous QHS  . furosemide  40 mg Oral Daily  . insulin aspart  0-9 Units Subcutaneous TID WC  . insulin glargine  15 Units Subcutaneous QHS  . lidocaine  1 patch Transdermal Q24H  . metoprolol tartrate  50 mg Oral BID  . ramipril  10 mg Oral Daily  . simvastatin  40 mg Oral QHS  . vitamin B-12  1,000 mcg Oral Daily   Continuous Infusions: . sodium chloride 250 mL (12/08/18 2215)  . cefTRIAXone (ROCEPHIN)  IV 1 g (12/08/18 2215)   PRN Meds:.sodium chloride, acetaminophen **OR** acetaminophen, hydrALAZINE, nitroGLYCERIN, ondansetron **OR** ondansetron (ZOFRAN) IV  Micro Results Recent Results (from the past 240 hour(s))  Blood culture (routine x 2)     Status: None (Preliminary result)   Collection Time: 12/06/18  4:25 PM  Result Value Ref Range Status   Specimen Description BLOOD RIGHT HAND  Final   Special Requests   Final    BOTTLES DRAWN AEROBIC AND ANAEROBIC Blood Culture results may not be optimal due to an inadequate volume of blood received in culture bottles   Culture   Final    NO GROWTH 3 DAYS Performed at [facility], [place] [address], [place], [place] [address]    Report Status PENDING  Incomplete  Urine culture     Status: Abnormal   Collection Time: 12/06/18  8:01 PM  Result Value Ref Range Status   Specimen Description URINE, RANDOM  Final   Special Requests   Final     NONE Performed at [facility], [place] [address], [place], [place] [address]    Culture >=100,000 COLONIES/mL ESCHERICHIA COLI (A)  Final   Report Status 12/09/2018 FINAL  Final   Organism ID, Bacteria ESCHERICHIA COLI (A)  Final      Susceptibility   Escherichia coli - MIC*    AMPICILLIN <=2 SENSITIVE Sensitive     CEFAZOLIN <=4 SENSITIVE Sensitive     CEFTRIAXONE <=1 SENSITIVE Sensitive     CIPROFLOXACIN <=0.25 SENSITIVE Sensitive     GENTAMICIN <=1 SENSITIVE Sensitive     IMIPENEM <=0.25 SENSITIVE Sensitive     NITROFURANTOIN <=16 SENSITIVE Sensitive     TRIMETH/SULFA <=20 SENSITIVE Sensitive     AMPICILLIN/SULBACTAM <=2 SENSITIVE Sensitive     PIP/TAZO <=4 SENSITIVE Sensitive     Extended ESBL NEGATIVE Sensitive     * >=100,000 COLONIES/mL ESCHERICHIA COLI  Blood culture (routine x 2)     Status: None (Preliminary result)   Collection Time: 12/06/18 11:15 PM  Result Value Ref Range Status   Specimen Description BLOOD LEFT HAND  Final   Special Requests  Final    BOTTLES DRAWN AEROBIC ONLY Blood Culture results may not be optimal due to an inadequate volume of blood received in culture bottles   Culture   Final    NO GROWTH 2 DAYS Performed at [facility], [place] [address] [place], [place] [address]    Report Status PENDING  Incomplete    Radiology Reports Ct Head Wo Contrast  Result Date: 12/06/2018 CLINICAL DATA:  Altered mental status EXAM: CT HEAD WITHOUT CONTRAST TECHNIQUE: Contiguous axial images were obtained from the base of the skull through the vertex without intravenous contrast. COMPARISON:  09/18/2017 FINDINGS: Brain: Mild atrophic and chronic white matter ischemic changes are seen. The overall appearance is stable from the prior study. No findings to suggest acute hemorrhage, acute infarction or space-occupying mass lesion are seen. Vascular: No hyperdense vessel or unexpected calcification. Skull: Normal. Negative for fracture or focal lesion.  Sinuses/Orbits: No acute finding. Other: None. IMPRESSION: Chronic atrophic and ischemic changes without acute abnormality. Electronically Signed   By: Inez Catalina M.D.   On: 12/06/2018 19:31   Ct Angio Chest Pe W Or Wo Contrast  Result Date: 12/07/2018 CLINICAL DATA:  73 y/o  F; PE suspected, high pretest prob. EXAM: CT ANGIOGRAPHY CHEST WITH CONTRAST TECHNIQUE: Multidetector CT imaging of the chest was performed using the standard protocol during bolus administration of intravenous contrast. Multiplanar CT image reconstructions and MIPs were obtained to evaluate the vascular anatomy. CONTRAST:  64 cc Isovue 370 COMPARISON:  10/03/2018 CT chest. FINDINGS: Cardiovascular: 4.2 cm ascending aortic aneurysm. Enlarged main pulmonary artery measuring 3.7 cm. Cardiomegaly. Severe coronary artery calcific atherosclerosis. Satisfactory opacification of the pulmonary arteries. No pulmonary embolus identified. Mediastinum/Nodes: Thyroid goiter with multiple small nodules. No mediastinal or axillary lymphadenopathy. Normal thoracic esophagus. Lungs/Pleura: Platelike atelectasis in the lung bases. No consolidation. No pleural effusion or pneumothorax. Upper Abdomen: No acute abnormality. Musculoskeletal: Multiple chronic bilateral anterior rib fractures Review of the MIP images confirms the above findings. IMPRESSION: 1. No pulmonary embolus identified. 2. Enlarged main pulmonary artery may represent pulmonary artery hypertension. Stable cardiomegaly. 3. 4.2 cm ascending aortic aneurysm. Recommend annual imaging followup by CTA or MRA. This recommendation follows 2010 ACCF/AHA/AATS/ACR/ASA/SCA/SCAI/SIR/STS/SVM Guidelines for the Diagnosis and Management of Patients with Thoracic Aortic Disease. 2010; 121: X324-M010. 4. Thyroid goiter with multiple small nodules. 5. Platelike atelectasis in the lung Electronically Signed   By: Kristine Garbe M.D.   On: 12/07/2018 03:06   Dg Chest Portable 1 View  Result Date:  12/06/2018 CLINICAL DATA:  Tachycardia.  Confusion.  Suspect sepsis. EXAM: PORTABLE CHEST 1 VIEW COMPARISON:  12/04/2018 FINDINGS: The heart is moderately enlarged. Normal vascularity. Subsegmental atelectasis at the left base. No pneumothorax. No consolidation. IMPRESSION: Cardiomegaly without decompensation. Electronically Signed   By: Marybelle Killings M.D.   On: 12/06/2018 17:19   US Thyroid  Result Date: 12/09/2018 CLINICAL DATA:  Incidental on CT. 73 year old female with multiple thyroid nodules seen on CT scan of the chest. EXAM: THYROID ULTRASOUND TECHNIQUE: Ultrasound examination of the thyroid gland and adjacent soft tissues was performed. COMPARISON:  CT scan of the chest 12/07/2018 FINDINGS: Parenchymal Echotexture: Moderately heterogenous Isthmus: 0.6 cm Right lobe: 6.2 x 2.2 x 2.8 cm Left lobe: 6.9 x 2.2 x 2.5 cm _________________________________________________________ Estimated total number of nodules >/= 1 cm: 6-10 Number of spongiform nodules >/=  2 cm not described below (TR1): 0 Number of mixed cystic and solid nodules >/= 1.5 cm not described below (TR2): 0 _________________________________________________________ Innumerable thyroid  nodules, simple cysts and minimally complex mixed cystic and solid nodules bilaterally. All of the nodules measuring larger than 1 cm either represent simple cysts, or TI-RADS category 2 spongiform or isoechoic mixed cystic and solid nodules, none of which meet criteria for biopsy or further imaging follow-up. The only solid nodules all measure less than 0.9 cm and also do not meet criteria for further evaluation. No nodules demonstrate suspicious features. IMPRESSION: Large multinodular goiter. None of the cysts or nodules meet criteria for biopsy or further imaging evaluation. No further follow-up required. The above is in keeping with the ACR TI-RADS recommendations - J Am Coll Radiol 2017;14:587-595. Electronically Signed   By: Jacqulynn Cadet M.D.   On:  12/09/2018 09:32   Xr Thoracic Spine 2 View  Result Date: 12/04/2018 X-rays thoracic spine: She has vertebroplasty changes at T8, 11 and 12 which are unchanged compared to CT scan from October 03, 2018.  There is a T7 compression fracture which was not treated with vertebroplasty but appears to be unchanged from November 2019.  I do not see any new compression deformities.     Time Spent in minutes  25   Kijana Estock M.D on 12/09/2018 at 11:41 AM  Between 7am to 7pm - Pager - [phone]  After 7pm go to [email or web address] - password [facility] [place]  [facility] -  Office  [phone]

## 2018-12-10 DIAGNOSIS — N390 Urinary tract infection, site not specified: Secondary | ICD-10-CM | POA: Diagnosis not present

## 2018-12-10 DIAGNOSIS — G9340 Encephalopathy, unspecified: Secondary | ICD-10-CM | POA: Diagnosis not present

## 2018-12-10 DIAGNOSIS — E042 Nontoxic multinodular goiter: Secondary | ICD-10-CM

## 2018-12-10 DIAGNOSIS — B9620 Unspecified Escherichia coli [E. coli] as the cause of diseases classified elsewhere: Secondary | ICD-10-CM | POA: Diagnosis not present

## 2018-12-10 LAB — GLUCOSE, CAPILLARY
Glucose-Capillary: 132 mg/dL — ABNORMAL HIGH (ref 70–99)
Glucose-Capillary: 123 mg/dL — ABNORMAL HIGH (ref 70–99)
Glucose-Capillary: 187 mg/dL — ABNORMAL HIGH (ref 70–99)
Glucose-Capillary: 138 mg/dL — ABNORMAL HIGH (ref 70–99)
Glucose-Capillary: 172 mg/dL — ABNORMAL HIGH (ref 70–99)

## 2018-12-10 NOTE — Progress Notes (Signed)
PROGRESS NOTE                                                                                                                                                                                                             Patient Demographics:    [name], is a 73 y.o. female, DOB - 24-Jan-1946, UMP:[id number]  Admit date - 12/06/2018   Admitting Physician Rise Patience, MD  Outpatient Primary MD for the patient is Shirline Frees, MD  LOS - 0  Outpatient Specialists: None  Chief Complaint  Patient presents with  . Altered Mental Status       Brief Narrative   73 year old female with history of CAD status post stenting in 4008, chronic diastolic CHF, diabetes mellitus type 2, hypertension, hyperlipidemia, thoracic kyphoplasty who was having left sided chest pain underneath her left breast for past 1 week.  She went to her PCP few days back and had x-rays done which was negative for any fracture.  She was prescribed meloxicam and baclofen.  Patient lives alone and a friend called EMS who  found her to be confused and incontinent with urine and bowel.  She was brought to the ED. Head CT unremarkable.  Chest x-ray without any infiltrate.  UA was suggestive of UTI.   Subjective:   Patient more calm today.  Refuses SNF initially but was convinced upon further discussion.  Assessment  & Plan :    Principal Problem:   Acute metabolic encephalopathy Possibly secondary to UTI and somnolence with baclofen.  Still having episodes of sundowning and refusing IV placement.  He is much calmer today. Urine culture growing pansensitive E. coli.  Continue Keflex. on PRN IM Haldol for agitation and bedtime Seroquel. Seen by PT who recommends SNF.  Active problems Left-sided chest pain. Mildly elevated troponin.  CT chest showing old rib fracture.  Reproducible pain on exam. 2D echo with normal EF and no wall motion  abnormality. Symptoms resolved with Lidoderm patch.  Essential hypertension/chronic diastolic CHF/coronary artery disease Euvolemic.  Continue aspirin, beta-blocker, ACE inhibitor, Lasix and statin.  Diabetes mellitus type 2, uncontrolled with hyperglycemia CBG stable after increasing Lantus dose, continue sliding scale coverage.  Metformin and Actos on hold.  Hypokalemia Replenished  Physical deconditioning Seen by PT/OT and recommend SNF.  Ascending aortic aneurysm measuring 4.2 cm Incidental finding on  CT. Recommend annual follow up with CTA or MRI chest.  Thyroid nodule with multiple small nodules TSH normal.  Ultrasound thyroid shows large multinodular goiter.,  None of the cysts or nodules meet criteria for biopsy.    Code Status : Full code  Family Communication  : None at bedside  Disposition Plan  :  Patient unsafe to be discharged home with her underlying encephalopathy, unsafe mobility and physical deconditioning.  Patient needs short-term rehab and referral has been sent.  Barriers For Discharge : Active symptoms  Consults  : None  Procedures  : CT head, 2D echo, ultrasound thyroid  DVT Prophylaxis  :  Lovenox -   Lab Results  Component Value Date   PLT 195 12/07/2018    Antibiotics  :   Anti-infectives (From admission, onward)   Start     Dose/Rate Route Frequency Ordered Stop   12/09/18 1145  cephALEXin (KEFLEX) capsule 500 mg     500 mg Oral Every 12 hours 12/09/18 1142     12/06/18 2300  cefTRIAXone (ROCEPHIN) 1 g in sodium chloride 0.9 % 100 mL IVPB  Status:  Discontinued     1 g 200 mL/hr over 30 Minutes Intravenous Every 24 hours 12/06/18 2157 12/09/18 1142   12/06/18 1700  vancomycin (VANCOCIN) 2,000 mg in sodium chloride 0.9 % 500 mL IVPB     2,000 mg 250 mL/hr over 120 Minutes Intravenous  Once 12/06/18 1628 12/06/18 2023   12/06/18 1630  piperacillin-tazobactam (ZOSYN) IVPB 3.375 g     3.375 g 100 mL/hr over 30 Minutes Intravenous  Once  12/06/18 1628 12/06/18 1852        Objective:   Vitals:   12/10/18 0401 12/10/18 0500 12/10/18 0736 12/10/18 1210  BP: (!) 143/85  (!) 151/75 130/87  Pulse: 68  62 76  Resp:   20   Temp: 97.6 F (36.4 C)  97.7 F (36.5 C) 97.9 F (36.6 C)  TempSrc: Oral  Oral Oral  SpO2: 95%  96% (!) 74%  Weight:  110.1 kg      Wt Readings from Last 3 Encounters:  12/10/18 110.1 kg  10/02/18 108.4 kg  06/15/16 88.5 kg     Intake/Output Summary (Last 24 hours) at 12/10/2018 1624 Last data filed at 12/09/2018 1857 Gross per 24 hour  Intake 600 ml  Output -  Net 600 ml   Physical exam Not in distress HEENT: Moist mucosa, supple neck Chest: Clear to auscultation bilaterally CVs: Normal S1 and S2, no murmurs GI: Soft, nondistended, nontender Musculoskeletal: Warm, no edema      Data Review:    CBC Recent Labs  Lab 12/06/18 1631 12/07/18 0411  WBC 8.2 10.4  HGB 15.2* 12.6  HCT 48.6* 39.4  PLT 217 195  MCV 95.9 94.3  MCH 30.0 30.1  MCHC 31.3 32.0  RDW 13.3 13.2  LYMPHSABS 0.8 1.2  MONOABS 0.4 1.5*  EOSABS 0.0 0.0  BASOSABS 0.0 0.0    Chemistries  Recent Labs  Lab 12/06/18 1631 12/07/18 0411 12/07/18 2159 12/08/18 0444  NA 138 138  --  139  K 4.4 2.9* 3.4* 3.2*  CL 102 103  --  103  CO2 23 26  --  26  GLUCOSE 202* 159*  --  122*  BUN 10 13  --  21  CREATININE 0.90 0.85  --  1.02*  CALCIUM 9.4 8.6*  --  8.9  AST 27 16  --   --   ALT 17  14  --   --   ALKPHOS 72 49  --   --   BILITOT 1.6* 0.6  --   --    ------------------------------------------------------------------------------------------------------------------ No results for input(s): CHOL, HDL, LDLCALC, TRIG, CHOLHDL, LDLDIRECT in the last 72 hours.  Lab Results  Component Value Date   HGBA1C 5.3 07/18/2015   ------------------------------------------------------------------------------------------------------------------ No results for input(s): TSH, T4TOTAL, T3FREE, THYROIDAB in the last 72  hours.  Invalid input(s): FREET3 ------------------------------------------------------------------------------------------------------------------ No results for input(s): VITAMINB12, FOLATE, FERRITIN, TIBC, IRON, RETICCTPCT in the last 72 hours.  Coagulation profile No results for input(s): INR, PROTIME in the last 168 hours.  No results for input(s): DDIMER in the last 72 hours.  Cardiac Enzymes Recent Labs  Lab 12/06/18 2252 12/07/18 0411 12/07/18 0955  TROPONINI 0.15* 0.18* 0.13*   ------------------------------------------------------------------------------------------------------------------    Component Value Date/Time   BNP 220.6 (H) 09/18/2017 0052    Inpatient Medications  Scheduled Meds: . cephALEXin  500 mg Oral Q12H  . enoxaparin (LOVENOX) injection  40 mg Subcutaneous QHS  . furosemide  40 mg Oral Daily  . insulin aspart  0-9 Units Subcutaneous TID WC  . insulin glargine  15 Units Subcutaneous QHS  . lidocaine  1 patch Transdermal Q24H  . metoprolol tartrate  50 mg Oral BID  . QUEtiapine  25 mg Oral QHS  . ramipril  10 mg Oral Daily  . simvastatin  40 mg Oral QHS  . vitamin B-12  1,000 mcg Oral Daily   Continuous Infusions: . sodium chloride 250 mL (12/08/18 2215)   PRN Meds:.sodium chloride, acetaminophen **OR** acetaminophen, haloperidol lactate, hydrALAZINE, nitroGLYCERIN, ondansetron **OR** ondansetron (ZOFRAN) IV  Micro Results Recent Results (from the past 240 hour(s))  Blood culture (routine x 2)     Status: None (Preliminary result)   Collection Time: 12/06/18  4:25 PM  Result Value Ref Range Status   Specimen Description BLOOD RIGHT HAND  Final   Special Requests   Final    BOTTLES DRAWN AEROBIC AND ANAEROBIC Blood Culture results may not be optimal due to an inadequate volume of blood received in culture bottles   Culture   Final    NO GROWTH 4 DAYS Performed at [facility], [place] [address], [place], [place] [address]    Report  Status PENDING  Incomplete  Urine culture     Status: Abnormal   Collection Time: 12/06/18  8:01 PM  Result Value Ref Range Status   Specimen Description URINE, RANDOM  Final   Special Requests   Final    NONE Performed at [facility], [place] [address] [place], [place] [address]    Culture >=100,000 COLONIES/mL ESCHERICHIA COLI (A)  Final   Report Status 12/09/2018 FINAL  Final   Organism ID, Bacteria ESCHERICHIA COLI (A)  Final      Susceptibility   Escherichia coli - MIC*    AMPICILLIN <=2 SENSITIVE Sensitive     CEFAZOLIN <=4 SENSITIVE Sensitive     CEFTRIAXONE <=1 SENSITIVE Sensitive     CIPROFLOXACIN <=0.25 SENSITIVE Sensitive     GENTAMICIN <=1 SENSITIVE Sensitive     IMIPENEM <=0.25 SENSITIVE Sensitive     NITROFURANTOIN <=16 SENSITIVE Sensitive     TRIMETH/SULFA <=20 SENSITIVE Sensitive     AMPICILLIN/SULBACTAM <=2 SENSITIVE Sensitive     PIP/TAZO <=4 SENSITIVE Sensitive     Extended ESBL NEGATIVE Sensitive     * >=100,000 COLONIES/mL ESCHERICHIA COLI  Blood culture (routine x 2)  Status: None (Preliminary result)   Collection Time: 12/06/18 11:15 PM  Result Value Ref Range Status   Specimen Description BLOOD LEFT HAND  Final   Special Requests   Final    BOTTLES DRAWN AEROBIC ONLY Blood Culture results may not be optimal due to an inadequate volume of blood received in culture bottles   Culture   Final    NO GROWTH 3 DAYS Performed at [facility], [place] [address], [place], [place] [address]    Report Status PENDING  Incomplete    Radiology Reports Ct Head Wo Contrast  Result Date: 12/06/2018 CLINICAL DATA:  Altered mental status EXAM: CT HEAD WITHOUT CONTRAST TECHNIQUE: Contiguous axial images were obtained from the base of the skull through the vertex without intravenous contrast. COMPARISON:  09/18/2017 FINDINGS: Brain: Mild atrophic and chronic white matter ischemic changes are seen. The overall appearance is stable from the prior study. No findings  to suggest acute hemorrhage, acute infarction or space-occupying mass lesion are seen. Vascular: No hyperdense vessel or unexpected calcification. Skull: Normal. Negative for fracture or focal lesion. Sinuses/Orbits: No acute finding. Other: None. IMPRESSION: Chronic atrophic and ischemic changes without acute abnormality. Electronically Signed   By: Inez Catalina M.D.   On: 12/06/2018 19:31   Ct Angio Chest Pe W Or Wo Contrast  Result Date: 12/07/2018 CLINICAL DATA:  73 y/o  F; PE suspected, high pretest prob. EXAM: CT ANGIOGRAPHY CHEST WITH CONTRAST TECHNIQUE: Multidetector CT imaging of the chest was performed using the standard protocol during bolus administration of intravenous contrast. Multiplanar CT image reconstructions and MIPs were obtained to evaluate the vascular anatomy. CONTRAST:  64 cc Isovue 370 COMPARISON:  10/03/2018 CT chest. FINDINGS: Cardiovascular: 4.2 cm ascending aortic aneurysm. Enlarged main pulmonary artery measuring 3.7 cm. Cardiomegaly. Severe coronary artery calcific atherosclerosis. Satisfactory opacification of the pulmonary arteries. No pulmonary embolus identified. Mediastinum/Nodes: Thyroid goiter with multiple small nodules. No mediastinal or axillary lymphadenopathy. Normal thoracic esophagus. Lungs/Pleura: Platelike atelectasis in the lung bases. No consolidation. No pleural effusion or pneumothorax. Upper Abdomen: No acute abnormality. Musculoskeletal: Multiple chronic bilateral anterior rib fractures Review of the MIP images confirms the above findings. IMPRESSION: 1. No pulmonary embolus identified. 2. Enlarged main pulmonary artery may represent pulmonary artery hypertension. Stable cardiomegaly. 3. 4.2 cm ascending aortic aneurysm. Recommend annual imaging followup by CTA or MRA. This recommendation follows 2010 ACCF/AHA/AATS/ACR/ASA/SCA/SCAI/SIR/STS/SVM Guidelines for the Diagnosis and Management of Patients with Thoracic Aortic Disease. 2010; 121: O277-A128. 4.  Thyroid goiter with multiple small nodules. 5. Platelike atelectasis in the lung Electronically Signed   By: Kristine Garbe M.D.   On: 12/07/2018 03:06   Dg Chest Portable 1 View  Result Date: 12/06/2018 CLINICAL DATA:  Tachycardia.  Confusion.  Suspect sepsis. EXAM: PORTABLE CHEST 1 VIEW COMPARISON:  12/04/2018 FINDINGS: The heart is moderately enlarged. Normal vascularity. Subsegmental atelectasis at the left base. No pneumothorax. No consolidation. IMPRESSION: Cardiomegaly without decompensation. Electronically Signed   By: Marybelle Killings M.D.   On: 12/06/2018 17:19   US Thyroid  Result Date: 12/09/2018 CLINICAL DATA:  Incidental on CT. 73 year old female with multiple thyroid nodules seen on CT scan of the chest. EXAM: THYROID ULTRASOUND TECHNIQUE: Ultrasound examination of the thyroid gland and adjacent soft tissues was performed. COMPARISON:  CT scan of the chest 12/07/2018 FINDINGS: Parenchymal Echotexture: Moderately heterogenous Isthmus: 0.6 cm Right lobe: 6.2 x 2.2 x 2.8 cm Left lobe: 6.9 x 2.2 x 2.5 cm _________________________________________________________ Estimated total number of nodules >/= 1 cm:  6-10 Number of spongiform nodules >/=  2 cm not described below (TR1): 0 Number of mixed cystic and solid nodules >/= 1.5 cm not described below (TR2): 0 _________________________________________________________ Innumerable thyroid nodules, simple cysts and minimally complex mixed cystic and solid nodules bilaterally. All of the nodules measuring larger than 1 cm either represent simple cysts, or TI-RADS category 2 spongiform or isoechoic mixed cystic and solid nodules, none of which meet criteria for biopsy or further imaging follow-up. The only solid nodules all measure less than 0.9 cm and also do not meet criteria for further evaluation. No nodules demonstrate suspicious features. IMPRESSION: Large multinodular goiter. None of the cysts or nodules meet criteria for biopsy or further  imaging evaluation. No further follow-up required. The above is in keeping with the ACR TI-RADS recommendations - J Am Coll Radiol 2017;14:587-595. Electronically Signed   By: Jacqulynn Cadet M.D.   On: 12/09/2018 09:32   Xr Thoracic Spine 2 View  Result Date: 12/04/2018 X-rays thoracic spine: She has vertebroplasty changes at T8, 11 and 12 which are unchanged compared to CT scan from October 03, 2018.  There is a T7 compression fracture which was not treated with vertebroplasty but appears to be unchanged from November 2019.  I do not see any new compression deformities.     Time Spent in minutes  25   Eliese Kerwood M.D on 12/10/2018 at 4:24 PM  Between 7am to 7pm - Pager - [phone]  After 7pm go to [email or web address] - password [facility]  [facility] -  Office  [phone]

## 2018-12-10 NOTE — Progress Notes (Signed)
CSW alerted by MD and RNCM that patient has become agreeable to SNF, and would prefer [facility] or [facility]. CSW reached out to [facility], and they are full. CSW contacted [facility] and they are able to accept the patient, they have initiated insurance authorization.   CSW to continue to follow.  Laveda Abbe, [place] Clinical Social Worker [phone]

## 2018-12-10 NOTE — Care Management Obs Status (Signed)
[place] NOTIFICATION   Patient Details  Name: [name] MRN: [id number] Date of Birth: 30-Nov-1945   Medicare Observation Status Notification Given:  Yes    Pollie Friar, RN 12/10/2018, 1:38 PM

## 2018-12-10 NOTE — Progress Notes (Signed)
Physical Therapy Treatment Patient Details Name: [name] MRN: [id number] DOB: 1946/08/19 Today's Date: 12/10/2018    History of Present Illness Patient is a 73 y/o female admitted 12/06/2017 after being found on the bed confused, incontinent of urine and bowel by EMS.  History of L upper thoracic pain for 1 week, PCP found no acute compression fractures on 12/04/18.  Per internal medicine suspect baclofen induced seizure/sedation. PMH: anxiety, arthritis, CAD, HTN, MI, SVT, DM type 2, kyphoplasty x 2, ORIF L hip.     PT Comments    Patient seen for mobility progression. Patient initially refusing mobility, however with education on importance of OOB mobility patient willing to participate. Patient requiring Min A for all bed mobility and transfers. Patient declining further gait training due to L rib pain. Cueing for safety throughout due to decreased awareness of deficits. Will continue to recommend SNF at discharge.     Follow Up Recommendations  SNF;Supervision/Assistance - 24 hour     Equipment Recommendations  None recommended by PT    Recommendations for Other Services       Precautions / Restrictions Precautions Precautions: Fall Restrictions Weight Bearing Restrictions: No    Mobility  Bed Mobility Overal bed mobility: Needs Assistance Bed Mobility: Supine to Sit     Supine to sit: Min assist     General bed mobility comments: Min A with patient pulling up on PT; increased time and effort as well as patient using bed rails  Transfers Overall transfer level: Needs assistance Equipment used: Rolling walker (2 wheeled) Transfers: Sit to/from [facility] Sit to Stand: From elevated surface;Min assist;Min guard Stand pivot transfers: From elevated surface;Min assist;Min guard       General transfer comment: elevated bed height; increased time and effort; Min A to power up and for steadying with transfer  Ambulation/Gait              General Gait Details: patient declining further mobility due to rib pain   Stairs             Wheelchair Mobility    Modified Rankin (Stroke Patients Only)       Balance Overall balance assessment: Needs assistance Sitting-balance support: No upper extremity supported;Feet supported Sitting balance-Leahy Scale: Fair     Standing balance support: Bilateral upper extremity supported;During functional activity Standing balance-Leahy Scale: Poor                              Cognition Arousal/Alertness: Awake/alert Behavior During Therapy: WFL for tasks assessed/performed Overall Cognitive Status: Impaired/Different from baseline Area of Impairment: Following commands;Safety/judgement;Problem solving                       Following Commands: Follows one step commands consistently Safety/Judgement: Decreased awareness of safety;Decreased awareness of deficits   Problem Solving: Requires tactile cues;Requires verbal cues;Slow processing General Comments: consistent cueing to complete each task      Exercises      General Comments        Pertinent Vitals/Pain Pain Assessment: Faces Pain Score: 6  Faces Pain Scale: Hurts even more Pain Location: L ribs under breast Pain Descriptors / Indicators: Aching;Discomfort;Grimacing Pain Intervention(s): Limited activity within patient's tolerance;Monitored during session;Repositioned    Home Living                      Prior Function  PT Goals (current goals can now be found in the care plan section) Acute Rehab PT Goals Patient Stated Goal: I want to go home today PT Goal Formulation: With patient Time For Goal Achievement: 12/22/18 Potential to Achieve Goals: Fair Progress towards PT goals: Progressing toward goals    Frequency    Min 3X/week      PT Plan Current plan remains appropriate    Co-evaluation              AM-PAC PT "6 Clicks" Mobility    Outcome Measure  Help needed turning from your back to your side while in a flat bed without using bedrails?: A Lot Help needed moving from lying on your back to sitting on the side of a flat bed without using bedrails?: A Lot Help needed moving to and from a bed to a chair (including a wheelchair)?: A Little Help needed standing up from a chair using your arms (e.g., wheelchair or bedside chair)?: A Little Help needed to walk in hospital room?: A Lot Help needed climbing 3-5 steps with a railing? : A Lot 6 Click Score: 14    End of Session Equipment Utilized During Treatment: Gait belt Activity Tolerance: Patient tolerated treatment well;Patient limited by pain Patient left: in chair;with call bell/phone within reach;with chair alarm set Nurse Communication: Mobility status PT Visit Diagnosis: Unsteadiness on feet (R26.81);Difficulty in walking, not elsewhere classified (R26.2);Muscle weakness (generalized) (M62.81)     Time: 8251-8984 PT Time Calculation (min) (ACUTE ONLY): 17 min  Charges:  $Therapeutic Activity: 8-22 mins                      Lanney Gins, PT, DPT Supplemental Physical Therapist 12/10/18 11:30 AM Pager: [phone] Office: [phone]

## 2018-12-11 DIAGNOSIS — Z955 Presence of coronary angioplasty implant and graft: Secondary | ICD-10-CM | POA: Diagnosis not present

## 2018-12-11 DIAGNOSIS — R410 Disorientation, unspecified: Secondary | ICD-10-CM | POA: Diagnosis present

## 2018-12-11 DIAGNOSIS — H9190 Unspecified hearing loss, unspecified ear: Secondary | ICD-10-CM | POA: Diagnosis present

## 2018-12-11 DIAGNOSIS — I5032 Chronic diastolic (congestive) heart failure: Secondary | ICD-10-CM | POA: Diagnosis present

## 2018-12-11 DIAGNOSIS — R32 Unspecified urinary incontinence: Secondary | ICD-10-CM | POA: Diagnosis present

## 2018-12-11 DIAGNOSIS — B9620 Unspecified Escherichia coli [E. coli] as the cause of diseases classified elsewhere: Secondary | ICD-10-CM | POA: Diagnosis present

## 2018-12-11 DIAGNOSIS — F05 Delirium due to known physiological condition: Secondary | ICD-10-CM | POA: Diagnosis present

## 2018-12-11 DIAGNOSIS — E785 Hyperlipidemia, unspecified: Secondary | ICD-10-CM | POA: Diagnosis present

## 2018-12-11 DIAGNOSIS — R5381 Other malaise: Secondary | ICD-10-CM | POA: Diagnosis present

## 2018-12-11 DIAGNOSIS — F0390 Unspecified dementia without behavioral disturbance: Secondary | ICD-10-CM | POA: Diagnosis present

## 2018-12-11 DIAGNOSIS — E042 Nontoxic multinodular goiter: Secondary | ICD-10-CM | POA: Diagnosis present

## 2018-12-11 DIAGNOSIS — E1165 Type 2 diabetes mellitus with hyperglycemia: Secondary | ICD-10-CM | POA: Diagnosis present

## 2018-12-11 DIAGNOSIS — E876 Hypokalemia: Secondary | ICD-10-CM | POA: Diagnosis present

## 2018-12-11 DIAGNOSIS — E7800 Pure hypercholesterolemia, unspecified: Secondary | ICD-10-CM | POA: Diagnosis present

## 2018-12-11 DIAGNOSIS — R159 Full incontinence of feces: Secondary | ICD-10-CM | POA: Diagnosis present

## 2018-12-11 DIAGNOSIS — R0781 Pleurodynia: Secondary | ICD-10-CM | POA: Diagnosis present

## 2018-12-11 DIAGNOSIS — G9341 Metabolic encephalopathy: Secondary | ICD-10-CM | POA: Diagnosis present

## 2018-12-11 DIAGNOSIS — I252 Old myocardial infarction: Secondary | ICD-10-CM | POA: Diagnosis not present

## 2018-12-11 DIAGNOSIS — I110 Hypertensive heart disease with heart failure: Secondary | ICD-10-CM | POA: Diagnosis present

## 2018-12-11 DIAGNOSIS — E113599 Type 2 diabetes mellitus with proliferative diabetic retinopathy without macular edema, unspecified eye: Secondary | ICD-10-CM | POA: Diagnosis present

## 2018-12-11 DIAGNOSIS — N390 Urinary tract infection, site not specified: Secondary | ICD-10-CM

## 2018-12-11 DIAGNOSIS — I2510 Atherosclerotic heart disease of native coronary artery without angina pectoris: Secondary | ICD-10-CM | POA: Diagnosis present

## 2018-12-11 DIAGNOSIS — R4182 Altered mental status, unspecified: Secondary | ICD-10-CM | POA: Diagnosis present

## 2018-12-11 DIAGNOSIS — Z8249 Family history of ischemic heart disease and other diseases of the circulatory system: Secondary | ICD-10-CM | POA: Diagnosis not present

## 2018-12-11 LAB — GLUCOSE, CAPILLARY
Glucose-Capillary: 122 mg/dL — ABNORMAL HIGH (ref 70–99)
Glucose-Capillary: 164 mg/dL — ABNORMAL HIGH (ref 70–99)

## 2018-12-11 LAB — CULTURE, BLOOD (ROUTINE X 2): Culture: NO GROWTH

## 2018-12-11 MED ORDER — LIDOCAINE 5 % EX PTCH: 1.0000 | MEDICATED_PATCH | CUTANEOUS | 0 refills | Status: AC

## 2018-12-11 MED ORDER — CEPHALEXIN 500 MG PO CAPS: 500.0000 mg | ORAL_CAPSULE | Freq: Two times a day (BID) | ORAL | 0 refills | Status: AC

## 2018-12-11 NOTE — Progress Notes (Signed)
Physical Therapy Treatment Patient Details Name: [name] MRN: [id number] DOB: 10-26-1946 Today's Date: 12/11/2018    History of Present Illness Patient is a 73 y/o female admitted 12/06/2017 after being found on the bed confused, incontinent of urine and bowel by EMS.  History of L upper thoracic pain for 1 week, PCP found no acute compression fractures on 12/04/18.  Per internal medicine suspect baclofen induced seizure/sedation. PMH: anxiety, arthritis, CAD, HTN, MI, SVT, DM type 2, kyphoplasty x 2, ORIF L hip.     PT Comments    Patient seen for mobility progression with patient demonstrating increased motivation to participate. Patient requiring up to [place] for gait for RW management, safety, and turning. Patient does continue to report L sided rib pain limiting her mobility. PT to continue to recommend SNF at discharge. Will continue to follow acutely.     Follow Up Recommendations  SNF;Supervision/Assistance - 24 hour     Equipment Recommendations  None recommended by PT    Recommendations for Other Services       Precautions / Restrictions Precautions Precautions: Fall Restrictions Weight Bearing Restrictions: No    Mobility  Bed Mobility Overal bed mobility: Needs Assistance Bed Mobility: Supine to Sit     Supine to sit: Min assist     General bed mobility comments: Patient with use of bed rails and using PT arm to pull up on; PT using bed pad to bring hips to EOB  Transfers Overall transfer level: Needs assistance Equipment used: Rolling walker (2 wheeled) Transfers: Sit to/from Stand Sit to Stand: From elevated surface;Min assist;Min guard         General transfer comment: elevated bed height wiht light MIn A to power up at bedside; min guard for immediate standing balance  Ambulation/Gait Ambulation/Gait assistance: Min guard;Min assist Gait Distance (Feet): 75 Feet Assistive device: Rolling walker (2 wheeled) Gait Pattern/deviations:  Step-through pattern;Decreased stride length;Wide base of support;Trunk flexed Gait velocity: decreased   General Gait Details: patient more motivated to ambulate today. does require up to MIn A for RW management and turning   Stairs             Wheelchair Mobility    Modified Rankin (Stroke Patients Only)       Balance Overall balance assessment: Needs assistance Sitting-balance support: No upper extremity supported;Feet supported Sitting balance-Leahy Scale: Fair     Standing balance support: Bilateral upper extremity supported;During functional activity Standing balance-Leahy Scale: Poor Standing balance comment: reliant on UE support at [employer] Arousal/Alertness: Awake/alert Behavior During Therapy: WFL for tasks assessed/performed Overall Cognitive Status: Impaired/Different from baseline Area of Impairment: Following commands;Safety/judgement;Problem solving                       Following Commands: Follows one step commands consistently;Follows multi-step commands with increased time Safety/Judgement: Decreased awareness of safety;Decreased awareness of deficits   Problem Solving: Requires tactile cues;Requires verbal cues;Slow processing General Comments: cueing for each task;       Exercises      General Comments General comments (skin integrity, edema, etc.): patient wanting to quickly sit in recliner and immediately recline chair - when PT cuing patient for posture and to scoot back in chair, patient becoming agitated, wiht PT requesting mutual respect patient apologizing      Pertinent  Vitals/Pain Pain Assessment: Faces Faces Pain Scale: Hurts little more Pain Location: L ribs under breast Pain Descriptors / Indicators: Aching;Discomfort;Grimacing Pain Intervention(s): Limited activity within patient's tolerance;Monitored during session;Repositioned    Home Living                       Prior Function            PT Goals (current goals can now be found in the care plan section) Acute Rehab PT Goals Patient Stated Goal: I want to go home today PT Goal Formulation: With patient Time For Goal Achievement: 12/22/18 Potential to Achieve Goals: Fair Progress towards PT goals: Progressing toward goals    Frequency    Min 3X/week      PT Plan Current plan remains appropriate    Co-evaluation              AM-PAC PT "6 Clicks" Mobility   Outcome Measure  Help needed turning from your back to your side while in a flat bed without using bedrails?: A Lot Help needed moving from lying on your back to sitting on the side of a flat bed without using bedrails?: A Lot Help needed moving to and from a bed to a chair (including a wheelchair)?: A Little Help needed standing up from a chair using your arms (e.g., wheelchair or bedside chair)?: A Little Help needed to walk in hospital room?: A Lot Help needed climbing 3-5 steps with a railing? : A Lot 6 Click Score: 14    End of Session Equipment Utilized During Treatment: Gait belt Activity Tolerance: Patient tolerated treatment well;Patient limited by pain Patient left: in chair;with call bell/phone within reach;with chair alarm set Nurse Communication: Mobility status PT Visit Diagnosis: Unsteadiness on feet (R26.81);Difficulty in walking, not elsewhere classified (R26.2);Muscle weakness (generalized) (M62.81)     Time: 9983-3825 PT Time Calculation (min) (ACUTE ONLY): 15 min  Charges:  $Gait Training: 8-22 mins                      Lanney Gins, PT, DPT Supplemental Physical Therapist 12/11/18 10:17 AM Pager: [phone] Office: [phone]

## 2018-12-11 NOTE — Discharge Summary (Signed)
Physician Discharge Summary  [name] ERX:[id number] DOB: 1946/03/05 DOA: 12/06/2018  PCP: Shirline Frees, MD  Admit date: 12/06/2018 Discharge date: 12/11/2018  Admitted From: Home Disposition: Skilled nursing facility  Recommendations for Outpatient Follow-up:  Follow up with MD at SNF in 1 week.  Will complete 7-day course of antibiotic for UTI after 1/16. Patient has incidental finding of ascending aortic aneurysm which needs annual follow-up with a CT angiogram or an MRI.   Home Health: None Equipment/Devices: As per therapy at the facility   Discharge Condition: Fair CODE STATUS: Full code Diet recommendation: Heart Healthy     Discharge Diagnoses:  Principal Problem:   Acute metabolic encephalopathy   Active Problems:   CAD -S/P PCI Feb 2014   HTN (hypertension)   Proliferative diabetic retinopathy ([place])   Uncontrolled type 2 diabetes mellitus with hyperglycemia ([place])   Physical deconditioning   Acute delirium   Rib pain on left side   E-coli UTI  Brief narrative/HPI Please refer to admission H&P for details, in brief,73 year old female with history of CAD status post stenting in 9509, chronic diastolic CHF, diabetes mellitus type 2, hypertension, hyperlipidemia, thoracic kyphoplasty who was having left sided chest pain underneath her left breast for past 1 week.  She went to her PCP few days back and had x-rays done which was negative for any fracture.  She was prescribed meloxicam and baclofen.  Patient lives alone and a friend called EMS who  found her to be confused and incontinent with urine and bowel.  She was brought to the ED. Head CT unremarkable.  Chest x-ray without any infiltrate.  UA was suggestive of UTI.   Principal Problem:   Acute metabolic encephalopathy Possibly secondary to UTI and somnolence with baclofen.   Had episodes of sundowning while in the hospital improved with Seroquel. I suspect she does have some underlying mild  dementia. Urine culture growing pansensitive E. coli.    To biotic switch to Keflex and will treat for 7 days.   Active problems Left-sided chest pain. Mildly elevated troponin.  CT chest showing old rib fracture.  Reproducible pain on exam. 2D echo with normal EF and no wall motion abnormality. Symptoms resolved with Lidoderm patch.  Will be discharged on it along with Aleve as needed.   Essential hypertension/chronic diastolic CHF/coronary artery disease Euvolemic.  Continue aspirin, beta-blocker, ACE inhibitor, Lasix and statin.  Diabetes mellitus type 2, uncontrolled with hyperglycemia CBG improved after increasing Lantus dose while in the hospital.  Resume home dose Lantus, metformin and Actos upon discharge.   Hypokalemia Replenished  Physical deconditioning Seen by PT/OT and recommend SNF.  Ascending aortic aneurysm measuring 4.2 cm Incidental finding on CT. Recommend annual follow up with CTA or MRI chest.  Thyroid nodule with multiple small nodules TSH normal.  Ultrasound thyroid shows large multinodular goiter.,  None of the cysts or nodules meet criteria for biopsy.      Family Communication  : None at bedside  Disposition Plan  :  Patient deemed unsafe to be discharged home with her underlying encephalopathy, unsafe mobility and physical deconditioning.    Seen by physical therapy and given her physical deconditioning and encephalopathy recommended short-term rehab.    Consults  : None  Procedures  : CT head, 2D echo, ultrasound thyroid   Discharge Instructions   Allergies as of 12/11/2018      Reactions   Oxycodone Nausea Only      Medication List    STOP taking these  medications   baclofen 10 MG tablet Commonly known as:  LIORESAL   meloxicam 7.5 MG tablet Commonly known as:  MOBIC     TAKE these medications   calcium-vitamin D 500-200 MG-UNIT tablet Commonly known as:  OSCAL WITH D Take 1 tablet by mouth daily.    cephALEXin 500 MG capsule Commonly known as:  KEFLEX Take 1 capsule (500 mg total) by mouth every 12 (twelve) hours for 3 days.   cholecalciferol 25 MCG (1000 UT) tablet Commonly known as:  VITAMIN D3 Take 2,000 Units by mouth at bedtime.   furosemide 40 MG tablet Commonly known as:  LASIX Take 40 mg by mouth daily.   LANTUS SOLOSTAR 100 UNIT/ML Solostar Pen Generic drug:  Insulin Glargine Inject 10 Units into the skin at bedtime.   lidocaine 5 % Commonly known as:  LIDODERM Place 1 patch onto the skin daily. Remove & Discard patch within 12 hours or as directed by MD . Apply over left lateral chest   metFORMIN 1000 MG tablet Commonly known as:  GLUCOPHAGE Take 1,000 mg by mouth 2 (two) times daily.   metoprolol tartrate 50 MG tablet Commonly known as:  LOPRESSOR Take 1 tablet (50 mg total) by mouth 2 (two) times daily. PLEASE CONTACT OFFICE FOR ADDITIONAL REFILLS What changed:  additional instructions   multivitamin with minerals Tabs tablet Take 1 tablet by mouth daily.   naproxen sodium 220 MG tablet Commonly known as:  ALEVE Take 220 mg by mouth 2 (two) times daily as needed (pain).   nitroGLYCERIN 0.4 MG SL tablet Commonly known as:  NITROSTAT Place 1 tablet (0.4 mg total) under the tongue every 5 (five) minutes as needed for chest pain.   ondansetron 4 MG disintegrating tablet Commonly known as:  ZOFRAN ODT Take 1 tablet (4 mg total) by mouth every 8 (eight) hours as needed for nausea or vomiting.   pioglitazone 30 MG tablet Commonly known as:  ACTOS Take 30 mg by mouth at bedtime.   ramipril 10 MG capsule Commonly known as:  ALTACE Take 10 mg by mouth at bedtime.   simvastatin 40 MG tablet Commonly known as:  ZOCOR Take 40 mg by mouth at bedtime.   vitamin B-12 1000 MCG tablet Commonly known as:  CYANOCOBALAMIN Take 1,000 mcg by mouth daily.      Contact information for after-discharge care    Destination    [facility] .    Service:  Skilled Nursing Contact information: [address] [address] [phone]             Allergies  Allergen Reactions  . Oxycodone Nausea Only       Procedures/Studies: Ct Head Wo Contrast  Result Date: 12/06/2018 CLINICAL DATA:  Altered mental status EXAM: CT HEAD WITHOUT CONTRAST TECHNIQUE: Contiguous axial images were obtained from the base of the skull through the vertex without intravenous contrast. COMPARISON:  09/18/2017 FINDINGS: Brain: Mild atrophic and chronic white matter ischemic changes are seen. The overall appearance is stable from the prior study. No findings to suggest acute hemorrhage, acute infarction or space-occupying mass lesion are seen. Vascular: No hyperdense vessel or unexpected calcification. Skull: Normal. Negative for fracture or focal lesion. Sinuses/Orbits: No acute finding. Other: None. IMPRESSION: Chronic atrophic and ischemic changes without acute abnormality. Electronically Signed   By: Inez Catalina M.D.   On: 12/06/2018 19:31   Ct Angio Chest Pe W Or Wo Contrast  Result Date: 12/07/2018 CLINICAL DATA:  73 y/o  F; PE suspected, high pretest prob. EXAM: CT ANGIOGRAPHY CHEST WITH CONTRAST TECHNIQUE: Multidetector CT imaging of the chest was performed using the standard protocol during bolus administration of intravenous contrast. Multiplanar CT image reconstructions and MIPs were obtained to evaluate the vascular anatomy. CONTRAST:  64 cc Isovue 370 COMPARISON:  10/03/2018 CT chest. FINDINGS: Cardiovascular: 4.2 cm ascending aortic aneurysm. Enlarged main pulmonary artery measuring 3.7 cm. Cardiomegaly. Severe coronary artery calcific atherosclerosis. Satisfactory opacification of the pulmonary arteries. No pulmonary embolus identified. Mediastinum/Nodes: Thyroid goiter with multiple small nodules. No mediastinal or axillary lymphadenopathy. Normal thoracic esophagus. Lungs/Pleura: Platelike atelectasis in the lung  bases. No consolidation. No pleural effusion or pneumothorax. Upper Abdomen: No acute abnormality. Musculoskeletal: Multiple chronic bilateral anterior rib fractures Review of the MIP images confirms the above findings. IMPRESSION: 1. No pulmonary embolus identified. 2. Enlarged main pulmonary artery may represent pulmonary artery hypertension. Stable cardiomegaly. 3. 4.2 cm ascending aortic aneurysm. Recommend annual imaging followup by CTA or MRA. This recommendation follows 2010 ACCF/AHA/AATS/ACR/ASA/SCA/SCAI/SIR/STS/SVM Guidelines for the Diagnosis and Management of Patients with Thoracic Aortic Disease. 2010; 121: W979-G921. 4. Thyroid goiter with multiple small nodules. 5. Platelike atelectasis in the lung Electronically Signed   By: Kristine Garbe M.D.   On: 12/07/2018 03:06   Dg Chest Portable 1 View  Result Date: 12/06/2018 CLINICAL DATA:  Tachycardia.  Confusion.  Suspect sepsis. EXAM: PORTABLE CHEST 1 VIEW COMPARISON:  12/04/2018 FINDINGS: The heart is moderately enlarged. Normal vascularity. Subsegmental atelectasis at the left base. No pneumothorax. No consolidation. IMPRESSION: Cardiomegaly without decompensation. Electronically Signed   By: Marybelle Killings M.D.   On: 12/06/2018 17:19   US Thyroid  Result Date: 12/09/2018 CLINICAL DATA:  Incidental on CT. 73 year old female with multiple thyroid nodules seen on CT scan of the chest. EXAM: THYROID ULTRASOUND TECHNIQUE: Ultrasound examination of the thyroid gland and adjacent soft tissues was performed. COMPARISON:  CT scan of the chest 12/07/2018 FINDINGS: Parenchymal Echotexture: Moderately heterogenous Isthmus: 0.6 cm Right lobe: 6.2 x 2.2 x 2.8 cm Left lobe: 6.9 x 2.2 x 2.5 cm _________________________________________________________ Estimated total number of nodules >/= 1 cm: 6-10 Number of spongiform nodules >/=  2 cm not described below (TR1): 0 Number of mixed cystic and solid nodules >/= 1.5 cm not described below (TR2): 0  _________________________________________________________ Innumerable thyroid nodules, simple cysts and minimally complex mixed cystic and solid nodules bilaterally. All of the nodules measuring larger than 1 cm either represent simple cysts, or TI-RADS category 2 spongiform or isoechoic mixed cystic and solid nodules, none of which meet criteria for biopsy or further imaging follow-up. The only solid nodules all measure less than 0.9 cm and also do not meet criteria for further evaluation. No nodules demonstrate suspicious features. IMPRESSION: Large multinodular goiter. None of the cysts or nodules meet criteria for biopsy or further imaging evaluation. No further follow-up required. The above is in keeping with the ACR TI-RADS recommendations - J Am Coll Radiol 2017;14:587-595. Electronically Signed   By: Jacqulynn Cadet M.D.   On: 12/09/2018 09:32   Xr Thoracic Spine 2 View  Result Date: 12/04/2018 X-rays thoracic spine: She has vertebroplasty changes at T8, 11 and 12 which are unchanged compared to CT scan from October 03, 2018.  There is a T7 compression fracture which was not treated with vertebroplasty but appears to be unchanged from November 2019.  I do not see any new compression deformities.        Subjective: Reports feeling better.  No  overnight events.  Patient remains calm.  Discharge Exam: Vitals:   12/10/18 2315 12/11/18 1117  BP: (!) 144/80 (!) 126/92  Pulse: 88 73  Resp: 18 17  Temp: 98 F (36.7 C) 98.3 F (36.8 C)  SpO2: 98% 96%   Vitals:   12/10/18 1927 12/10/18 2315 12/11/18 0500 12/11/18 1117  BP: (!) 149/86 (!) 144/80  (!) 126/92  Pulse: 82 88  73  Resp: 18 18  17   Temp: 97.8 F (36.6 C) 98 F (36.7 C)  98.3 F (36.8 C)  TempSrc: Oral Oral  Oral  SpO2: 97% 98%  96%  Weight:   110 kg     General: Elderly female not in distress HEENT: Moist mucosa, supple neck Chest: Clear bilaterally CVS: Normal S1 and S2, no murmurs GI: Soft, nondistended,  nontender Musculoskeletal: Warm, no edema    The results of significant diagnostics from this hospitalization (including imaging, microbiology, ancillary and laboratory) are listed below for reference.     Microbiology: Recent Results (from the past 240 hour(s))  Blood culture (routine x 2)     Status: None (Preliminary result)   Collection Time: 12/06/18  4:25 PM  Result Value Ref Range Status   Specimen Description BLOOD RIGHT HAND  Final   Special Requests   Final    BOTTLES DRAWN AEROBIC AND ANAEROBIC Blood Culture results may not be optimal due to an inadequate volume of blood received in culture bottles   Culture   Final    NO GROWTH 4 DAYS Performed at [facility], [place] [address], [place], [place] [address]    Report Status PENDING  Incomplete  Urine culture     Status: Abnormal   Collection Time: 12/06/18  8:01 PM  Result Value Ref Range Status   Specimen Description URINE, RANDOM  Final   Special Requests   Final    NONE Performed at [facility], [place] [address], [place], [place] [address]    Culture >=100,000 COLONIES/mL ESCHERICHIA COLI (A)  Final   Report Status 12/09/2018 FINAL  Final   Organism ID, Bacteria ESCHERICHIA COLI (A)  Final      Susceptibility   Escherichia coli - MIC*    AMPICILLIN <=2 SENSITIVE Sensitive     CEFAZOLIN <=4 SENSITIVE Sensitive     CEFTRIAXONE <=1 SENSITIVE Sensitive     CIPROFLOXACIN <=0.25 SENSITIVE Sensitive     GENTAMICIN <=1 SENSITIVE Sensitive     IMIPENEM <=0.25 SENSITIVE Sensitive     NITROFURANTOIN <=16 SENSITIVE Sensitive     TRIMETH/SULFA <=20 SENSITIVE Sensitive     AMPICILLIN/SULBACTAM <=2 SENSITIVE Sensitive     PIP/TAZO <=4 SENSITIVE Sensitive     Extended ESBL NEGATIVE Sensitive     * >=100,000 COLONIES/mL ESCHERICHIA COLI  Blood culture (routine x 2)     Status: None (Preliminary result)   Collection Time: 12/06/18 11:15 PM  Result Value Ref Range Status   Specimen Description BLOOD LEFT HAND   Final   Special Requests   Final    BOTTLES DRAWN AEROBIC ONLY Blood Culture results may not be optimal due to an inadequate volume of blood received in culture bottles   Culture   Final    NO GROWTH 3 DAYS Performed at [facility] [address] [place], [place] [address]    Report Status PENDING  Incomplete     Labs: BNP (last 3 results) No results for input(s): BNP in the last 8760 hours. Basic Metabolic Panel:  Recent Labs  Lab 12/06/18 1631 12/07/18 0411 12/07/18 2159 12/08/18 0444  NA 138 138  --  139  K 4.4 2.9* 3.4* 3.2*  CL 102 103  --  103  CO2 23 26  --  26  GLUCOSE 202* 159*  --  122*  BUN 10 13  --  21  CREATININE 0.90 0.85  --  1.02*  CALCIUM 9.4 8.6*  --  8.9   Liver Function Tests: Recent Labs  Lab 12/06/18 1631 12/07/18 0411  AST 27 16  ALT 17 14  ALKPHOS 72 49  BILITOT 1.6* 0.6  PROT 8.1 6.1*  ALBUMIN 3.5 2.8*   Recent Labs  Lab 12/06/18 1631  LIPASE 31   No results for input(s): AMMONIA in the last 168 hours. CBC: Recent Labs  Lab 12/06/18 1631 12/07/18 0411  WBC 8.2 10.4  NEUTROABS 6.9 7.6  HGB 15.2* 12.6  HCT 48.6* 39.4  MCV 95.9 94.3  PLT 217 195   Cardiac Enzymes: Recent Labs  Lab 12/06/18 2252 12/07/18 0411 12/07/18 0955  TROPONINI 0.15* 0.18* 0.13*   BNP: Invalid input(s): POCBNP CBG: Recent Labs  Lab 12/10/18 1136 12/10/18 1645 12/10/18 2115 12/11/18 0622 12/11/18 1136  GLUCAP 187* 138* 172* 122* 164*   D-Dimer No results for input(s): DDIMER in the last 72 hours. Hgb A1c No results for input(s): HGBA1C in the last 72 hours. Lipid Profile No results for input(s): CHOL, HDL, LDLCALC, TRIG, CHOLHDL, LDLDIRECT in the last 72 hours. Thyroid function studies No results for input(s): TSH, T4TOTAL, T3FREE, THYROIDAB in the last 72 hours.  Invalid input(s): FREET3 Anemia work up No results for input(s): VITAMINB12, FOLATE, FERRITIN, TIBC, IRON, RETICCTPCT in the last 72 hours. Urinalysis     Component Value Date/Time   COLORURINE YELLOW 12/06/2018 1631   APPEARANCEUR CLOUDY (A) 12/06/2018 1631   LABSPEC 1.018 12/06/2018 1631   PHURINE 5.0 12/06/2018 1631   GLUCOSEU 50 (A) 12/06/2018 1631   HGBUR SMALL (A) 12/06/2018 1631   BILIRUBINUR NEGATIVE 12/06/2018 1631   KETONESUR 80 (A) 12/06/2018 1631   PROTEINUR 100 (A) 12/06/2018 1631   UROBILINOGEN 1.0 06/28/2015 1552   NITRITE NEGATIVE 12/06/2018 1631   LEUKOCYTESUR LARGE (A) 12/06/2018 1631   Sepsis Labs Invalid input(s): PROCALCITONIN,  WBC,  LACTICIDVEN Microbiology Recent Results (from the past 240 hour(s))  Blood culture (routine x 2)     Status: None (Preliminary result)   Collection Time: 12/06/18  4:25 PM  Result Value Ref Range Status   Specimen Description BLOOD RIGHT HAND  Final   Special Requests   Final    BOTTLES DRAWN AEROBIC AND ANAEROBIC Blood Culture results may not be optimal due to an inadequate volume of blood received in culture bottles   Culture   Final    NO GROWTH 4 DAYS Performed at [facility], [place] [address], [place], [place] [address]    Report Status PENDING  Incomplete  Urine culture     Status: Abnormal   Collection Time: 12/06/18  8:01 PM  Result Value Ref Range Status   Specimen Description URINE, RANDOM  Final   Special Requests   Final    NONE Performed at [facility], [place] [address], [place], [place] [address]    Culture >=100,000 COLONIES/mL ESCHERICHIA COLI (A)  Final   Report Status 12/09/2018 FINAL  Final   Organism ID, Bacteria ESCHERICHIA COLI (A)  Final      Susceptibility   Escherichia coli - MIC*  AMPICILLIN <=2 SENSITIVE Sensitive     CEFAZOLIN <=4 SENSITIVE Sensitive     CEFTRIAXONE <=1 SENSITIVE Sensitive     CIPROFLOXACIN <=0.25 SENSITIVE Sensitive     GENTAMICIN <=1 SENSITIVE Sensitive     IMIPENEM <=0.25 SENSITIVE Sensitive     NITROFURANTOIN <=16 SENSITIVE Sensitive     TRIMETH/SULFA <=20 SENSITIVE Sensitive     AMPICILLIN/SULBACTAM <=2  SENSITIVE Sensitive     PIP/TAZO <=4 SENSITIVE Sensitive     Extended ESBL NEGATIVE Sensitive     * >=100,000 COLONIES/mL ESCHERICHIA COLI  Blood culture (routine x 2)     Status: None (Preliminary result)   Collection Time: 12/06/18 11:15 PM  Result Value Ref Range Status   Specimen Description BLOOD LEFT HAND  Final   Special Requests   Final    BOTTLES DRAWN AEROBIC ONLY Blood Culture results may not be optimal due to an inadequate volume of blood received in culture bottles   Culture   Final    NO GROWTH 3 DAYS Performed at [facility], [place] [address] [place], [place] [address]    Report Status PENDING  Incomplete     Time coordinating discharge: <30 minutes  SIGNED:   Louellen Molder, MD  [facility] 12/11/2018, 1:10 PM Pager   If 7PM-7AM, please contact night-coverage [email or web address] Password [email or web address]

## 2018-12-11 NOTE — Clinical Social Work Placement (Signed)
Nurse to call report to [phone], Room [place]  NOTE  Date:  12/11/2018  Patient Details  Name: [name] MRN: [id number] Date of Birth: 1946/08/17  Clinical Social Work is seeking post-discharge placement for this patient at the [place] level of care (*CSW will initial, date and re-position this form in  chart as items are completed):  Yes   Patient/family provided with [place] Work Department's list of facilities offering this level of care within the geographic area requested by the patient (or if unable, by the patient's family).  Yes   Patient/family informed of their freedom to choose among providers that offer the needed level of care, that participate in Medicare, Medicaid or managed care program needed by the patient, have an available bed and are willing to accept the patient.  Yes   Patient/family informed of [facility]'s ownership interest in [facility] and [facility], as well as of the fact that they are under no obligation to receive care at these facilities.  PASRR submitted to EDS on       PASRR number received on       Existing PASRR number confirmed on 12/10/18     FL2 transmitted to all facilities in geographic area requested by pt/family on 12/10/18     FL2 transmitted to all facilities within larger geographic area on       Patient informed that his/her managed care company has contracts with or will negotiate with certain facilities, including the following:        Yes   Patient/family informed of bed offers received.  Patient chooses bed at [facility] recommends and patient chooses bed at      Patient to be transferred to [facility] on 12/11/18.  Patient to be transferred to facility by [facility]     Patient family notified on 12/11/18 of transfer.  Name of family member notified:  Self     PHYSICIAN        Additional Comment:    _______________________________________________ Geralynn Ochs, LCSW 12/11/2018, 2:04 PM

## 2018-12-11 NOTE — Progress Notes (Signed)
Gave report to [facility], Therapist, sports at [place].

## 2018-12-12 ENCOUNTER — Encounter: Payer: Self-pay | Admitting: Adult Health

## 2018-12-12 ENCOUNTER — Non-Acute Institutional Stay (SKILLED_NURSING_FACILITY): Payer: Medicare Other | Admitting: Adult Health

## 2018-12-12 DIAGNOSIS — I2510 Atherosclerotic heart disease of native coronary artery without angina pectoris: Secondary | ICD-10-CM | POA: Diagnosis not present

## 2018-12-12 DIAGNOSIS — B9620 Unspecified Escherichia coli [E. coli] as the cause of diseases classified elsewhere: Secondary | ICD-10-CM

## 2018-12-12 DIAGNOSIS — E7849 Other hyperlipidemia: Secondary | ICD-10-CM

## 2018-12-12 DIAGNOSIS — Z794 Long term (current) use of insulin: Secondary | ICD-10-CM

## 2018-12-12 DIAGNOSIS — I10 Essential (primary) hypertension: Secondary | ICD-10-CM | POA: Diagnosis not present

## 2018-12-12 DIAGNOSIS — N390 Urinary tract infection, site not specified: Secondary | ICD-10-CM

## 2018-12-12 DIAGNOSIS — G9341 Metabolic encephalopathy: Secondary | ICD-10-CM | POA: Diagnosis not present

## 2018-12-12 DIAGNOSIS — E1169 Type 2 diabetes mellitus with other specified complication: Secondary | ICD-10-CM

## 2018-12-12 DIAGNOSIS — I5032 Chronic diastolic (congestive) heart failure: Secondary | ICD-10-CM

## 2018-12-12 DIAGNOSIS — Z9861 Coronary angioplasty status: Secondary | ICD-10-CM

## 2018-12-12 LAB — CULTURE, BLOOD (ROUTINE X 2): Culture: NO GROWTH

## 2018-12-12 NOTE — Progress Notes (Signed)
Location:  [facility] Room Number: 120-A Place of Service:  SNF (31) Provider:  Durenda Age, NP  Patient Care Team: Shirline Frees, MD as PCP - General (Family Medicine)  Extended Emergency Contact Information Primary Emergency Contact: Hendricks Limes [place] Phone: [phone] Mobile Phone: [phone] Relation: Friend Secondary Emergency Contact: Whittington,Ray  [place] Phone: [phone] Relation: Other  Code Status:  DNR  Goals of care: Advanced Directive information Advanced Directives 12/12/2018  Does Patient Have a Medical Advance Directive? Yes  Type of Advance Directive Out of facility DNR (pink MOST or yellow form)  Does patient want to make changes to medical advance directive? No - Patient declined  Copy of [employer] in Chart? -  Would patient like information on creating a medical advance directive? -  Pre-existing out of facility DNR order (yellow form or pink MOST form) -     Chief Complaint  Patient presents with  . Acute Visit    Hospital followup, status post admission at [facility] 1/9-12/01/18 for acute metabolic encephalopathy    HPI:  Pt is a 73 y.o. female seen today for hospital followup.  She was admitted to [facility] on 12/11/18 for short-term rehabilitation, following an admission at [facility] 1/9-1/14/20 for acute metabolic encephalopathy which was thought to be possibly from UTI and Baclofen. She was having left-sided chest pain underneath her left breast for a week and went to her PCP.  X-rays done were negative for any fracture.  She was prescribed meloxicam and baclofen.  A friend found her to be confused and incontinent with urine and bowel and cold EMS and was brought to the ED.  Head CT was unremarkable.  Chest x-ray was negative for infiltrates.  Urinalysis was suggestive of UTI.  Urine culture grew pansensitive E. coli and was given  Keflex and will continue for 7 days.She had episodes of sundowning while in the hospital and improved with Seroquel.  She was suspected to have mild dementia.  Incidental finding on CT showed ascending aortic aneurysm measuring 4.2 cm.  Ultrasound of thyroid showed large multinodular goiter but none of the cyst or nodules meet criteria for biopsy. She has a PMH of CAD with stenting in 9794, chronic diastolic CHF, type 2 diabetes mellitus, hypertension, HLD, and thoracic kyphoplasty.  DON reported that she had called EMS and church members last night to ask for assistance to go home. She said that she always call them whenever she needs help. DON talked to her and reminded her that EMS is for emergency purposes only.   Past Medical History:  Diagnosis Date  . Anxiety    Due to current back pain and has to lie flat.  . Arthritis   . CAD (coronary artery disease)    a. NSTEMI in setting of UTI and SVT 12/2012 => LHC 01/08/13: Proximal LAD 30%, ostial diagonal 30-40%, proximal RCA 95%, inferior AK, EF 45-50% => PCI: Promus DES x 2 to RCA;  b. Echo 01/05/13: EF 80-16%, grade 1 diastolic dysfunction, MAC   . Compression fracture    thoracic 12  . Edema   . History of blood transfusion   . Hypercholesteremia   . Hypertension   . Myocardial infarction ([place]) 2014  . Rectal ulceration    colonoscopy 12/2012  . SVT (supraventricular tachycardia) ([place])   . Type II diabetes mellitus ([place])    Past Surgical History:  Procedure Laterality Date  . CATARACT EXTRACTION,  BILATERAL Bilateral   . COLONOSCOPY Left 01/12/2013   Procedure: COLONOSCOPY;  Surgeon: Wonda Horner, MD;  Location: [facility] ENDOSCOPY;  Service: Endoscopy;  Laterality: Left;  . COLONOSCOPY N/A 01/14/2013   Procedure: COLONOSCOPY;  Surgeon: Wonda Horner, MD;  Location: [facility] ENDOSCOPY;  Service: Endoscopy;  Laterality: N/A;  Rm 2034, attemped Colon on 2-15=inadequate prep  . CORONARY ANGIOPLASTY  12/2012  . EYE SURGERY Bilateral "many"  . FRACTURE  SURGERY    . KYPHOPLASTY  12/27/2012   Procedure: KYPHOPLASTY;  Surgeon: Sinclair Ship, MD;  Location: [facility];  Service: Orthopedics;  Laterality: Bilateral;  T-8 kyphoplasty  . KYPHOPLASTY N/A 07/15/2015   Procedure: T11 KYPHOPLASTY;  Surgeon: Phylliss Bob, MD;  Location: [facility];  Service: Orthopedics;  Laterality: N/A;  T11 kyphoplasty  . KYPHOPLASTY N/A 09/17/2015   Procedure: T12 kyphoplasty;  Surgeon: Phylliss Bob, MD;  Location: [facility];  Service: Orthopedics;  Laterality: N/A;  T12 kyphoplasty  . LEFT HEART CATHETERIZATION WITH CORONARY ANGIOGRAM N/A 01/08/2013   Procedure: LEFT HEART CATHETERIZATION WITH CORONARY ANGIOGRAM;  Surgeon: Thayer Headings, MD;  Location: [facility] CATH LAB;  Service: Cardiovascular;  Laterality: N/A;  . ORIF HIP FRACTURE Left 11/15/2014   Procedure: OPEN REDUCTION INTERNAL FIXATION HIP INTERTROCH;  Surgeon: Mcarthur Rossetti, MD;  Location: [facility];  Service: Orthopedics;  Laterality: Left;  . PERCUTANEOUS STENT INTERVENTION N/A 01/15/2013   Procedure: PERCUTANEOUS STENT INTERVENTION;  Surgeon: Peter M Martinique, MD;  Location: [facility] CATH LAB;  Service: Cardiovascular;  Laterality: N/A;  . TIBIA IM NAIL INSERTION Right 05/26/2016   Procedure: INTRAMEDULLARY (IM) NAIL TIBIAL;  Surgeon: Meredith Pel, MD;  Location: [place];  Service: Orthopedics;  Laterality: Right;  . TUBAL LIGATION      Allergies  Allergen Reactions  . Oxycodone Nausea Only    Outpatient Encounter Medications as of 12/12/2018  Medication Sig  . acetaminophen (TYLENOL) 325 MG tablet Take 650 mg by mouth every 6 (six) hours as needed.  . calcium-vitamin D (OSCAL WITH D) 500-200 MG-UNIT tablet Take 1 tablet by mouth daily.   . cephALEXin (KEFLEX) 500 MG capsule Take 1 capsule (500 mg total) by mouth every 12 (twelve) hours for 3 days.  . cholecalciferol (VITAMIN D3) 25 MCG (1000 UT) tablet Take 2,000 Units by mouth at bedtime.  . furosemide (LASIX) 40 MG tablet Take 40 mg by mouth daily.   .  Insulin Glargine (LANTUS SOLOSTAR) 100 UNIT/ML Solostar Pen Inject 10 Units into the skin at bedtime.  . lidocaine (LIDODERM) 5 % Place 1 patch onto the skin daily. Remove & Discard patch within 12 hours or as directed by MD . Apply over left lateral chest  . metFORMIN (GLUCOPHAGE) 1000 MG tablet Take 1,000 mg by mouth 2 (two) times daily.   . metoprolol (LOPRESSOR) 50 MG tablet Take 1 tablet (50 mg total) by mouth 2 (two) times daily. PLEASE CONTACT OFFICE FOR ADDITIONAL REFILLS  . Multiple Vitamin (MULTIVITAMIN WITH MINERALS) TABS tablet Take 1 tablet by mouth daily.   . naproxen sodium (ALEVE) 220 MG tablet Take 220 mg by mouth 2 (two) times daily as needed (pain).  . nitroGLYCERIN (NITROSTAT) 0.4 MG SL tablet Place 1 tablet (0.4 mg total) under the tongue every 5 (five) minutes as needed for chest pain.  Marland Kitchen ondansetron (ZOFRAN ODT) 4 MG disintegrating tablet Take 1 tablet (4 mg total) by mouth every 8 (eight) hours as needed for nausea or vomiting.  . pioglitazone (ACTOS) 30 MG tablet Take  30 mg by mouth at bedtime.   . ramipril (ALTACE) 10 MG capsule Take 10 mg by mouth at bedtime.   . simvastatin (ZOCOR) 40 MG tablet Take 40 mg by mouth at bedtime.   . vitamin B-12 (CYANOCOBALAMIN) 1000 MCG tablet Take 1,000 mcg by mouth daily.   No facility-administered encounter medications on file as of 12/12/2018.     Review of Systems  GENERAL: No change in appetite, no fatigue, no weight changes, no fever, chills or weakness MOUTH and THROAT: Denies oral discomfort, gingival pain or bleeding RESPIRATORY: no cough, SOB, DOE, wheezing, hemoptysis CARDIAC: No palpitations GI: No abdominal pain, diarrhea, constipation, heart burn, nausea or vomiting GU: Denies dysuria, frequency, hematuria,  or discharge NEUROLOGICAL: Denies dizziness, syncope, numbness, or headache PSYCHIATRIC: Denies feelings of depression or anxiety. No report of hallucinations, insomnia, paranoia, or agitation    Immunization  History  Administered Date(s) Administered  . PPD Test 05/28/2016  . Tdap 03/31/2012   Pertinent  Health Maintenance Due  Topic Date Due  . FOOT EXAM  05/08/1956  . OPHTHALMOLOGY EXAM  05/08/1954  . MAMMOGRAM  05/08/1994  . DEXA SCAN  05/08/2009  . PNA vac Low Risk Adult (1 of 2 - PCV13) 05/08/2009  . HEMOGLOBIN A1C  01/18/2016  . INFLUENZA VACCINE  06/28/2018  . COLONOSCOPY  01/14/2023   Fall Risk  06/15/2016  Falls in the past year? Yes  Number falls in past yr: 1  Injury with Fall? Yes  Risk Factor Category  High Fall Risk  Follow up Falls evaluation completed    Vitals:   12/12/18 1124  BP: 104/74  Pulse: 70  Resp: 18  Temp: 97.8 F (36.6 C)  TempSrc: Oral  SpO2: 96%  Weight: 242 lb 8.2 oz (110 kg)  Height: 5' 8.5" (1.74 m)   Body mass index is 36.34 kg/m.  Physical Exam  GENERAL APPEARANCE: Well nourished. In no acute distress. Obese SKIN:  Skin is warm and dry.  MOUTH and THROAT: Lips are without lesions. Oral mucosa is moist and without lesions. Tongue is normal in shape, size, and color and without lesions RESPIRATORY: Breathing is even & unlabored, BS CTAB CARDIAC: RRR, no murmur,no extra heart sounds, bilateral ankle 2-+ edema GI: Abdomen soft, normal BS, no masses, no tenderness NEUROLOGICAL: There is no tremor. Speech is clear. Alert and oriented X 3 PSYCHIATRIC: Affect and behavior are appropriate  Labs reviewed: Recent Labs    12/06/18 1631 12/07/18 0411 12/07/18 2159 12/08/18 0444  NA 138 138  --  139  K 4.4 2.9* 3.4* 3.2*  CL 102 103  --  103  CO2 23 26  --  26  GLUCOSE 202* 159*  --  122*  BUN 10 13  --  21  CREATININE 0.90 0.85  --  1.02*  CALCIUM 9.4 8.6*  --  8.9   Recent Labs    12/06/18 1631 12/07/18 0411  AST 27 16  ALT 17 14  ALKPHOS 72 49  BILITOT 1.6* 0.6  PROT 8.1 6.1*  ALBUMIN 3.5 2.8*   Recent Labs    10/02/18 2340 12/06/18 1631 12/07/18 0411  WBC 8.2 8.2 10.4  NEUTROABS 5.3 6.9 7.6  HGB 12.8 15.2* 12.6    HCT 40.9 48.6* 39.4  MCV 97.6 95.9 94.3  PLT 193 217 195   Lab Results  Component Value Date   TSH 0.655 12/06/2018   Lab Results  Component Value Date   HGBA1C 5.3 07/18/2015   Significant Diagnostic  Results in last 30 days:  Ct Head Wo Contrast  Result Date: 12/06/2018 CLINICAL DATA:  Altered mental status EXAM: CT HEAD WITHOUT CONTRAST TECHNIQUE: Contiguous axial images were obtained from the base of the skull through the vertex without intravenous contrast. COMPARISON:  09/18/2017 FINDINGS: Brain: Mild atrophic and chronic white matter ischemic changes are seen. The overall appearance is stable from the prior study. No findings to suggest acute hemorrhage, acute infarction or space-occupying mass lesion are seen. Vascular: No hyperdense vessel or unexpected calcification. Skull: Normal. Negative for fracture or focal lesion. Sinuses/Orbits: No acute finding. Other: None. IMPRESSION: Chronic atrophic and ischemic changes without acute abnormality. Electronically Signed   By: Inez Catalina M.D.   On: 12/06/2018 19:31   Ct Angio Chest Pe W Or Wo Contrast  Result Date: 12/07/2018 CLINICAL DATA:  73 y/o  F; PE suspected, high pretest prob. EXAM: CT ANGIOGRAPHY CHEST WITH CONTRAST TECHNIQUE: Multidetector CT imaging of the chest was performed using the standard protocol during bolus administration of intravenous contrast. Multiplanar CT image reconstructions and MIPs were obtained to evaluate the vascular anatomy. CONTRAST:  64 cc Isovue 370 COMPARISON:  10/03/2018 CT chest. FINDINGS: Cardiovascular: 4.2 cm ascending aortic aneurysm. Enlarged main pulmonary artery measuring 3.7 cm. Cardiomegaly. Severe coronary artery calcific atherosclerosis. Satisfactory opacification of the pulmonary arteries. No pulmonary embolus identified. Mediastinum/Nodes: Thyroid goiter with multiple small nodules. No mediastinal or axillary lymphadenopathy. Normal thoracic esophagus. Lungs/Pleura: Platelike atelectasis in  the lung bases. No consolidation. No pleural effusion or pneumothorax. Upper Abdomen: No acute abnormality. Musculoskeletal: Multiple chronic bilateral anterior rib fractures Review of the MIP images confirms the above findings. IMPRESSION: 1. No pulmonary embolus identified. 2. Enlarged main pulmonary artery may represent pulmonary artery hypertension. Stable cardiomegaly. 3. 4.2 cm ascending aortic aneurysm. Recommend annual imaging followup by CTA or MRA. This recommendation follows 2010 ACCF/AHA/AATS/ACR/ASA/SCA/SCAI/SIR/STS/SVM Guidelines for the Diagnosis and Management of Patients with Thoracic Aortic Disease. 2010; 121: W258-N277. 4. Thyroid goiter with multiple small nodules. 5. Platelike atelectasis in the lung Electronically Signed   By: Kristine Garbe M.D.   On: 12/07/2018 03:06   Dg Chest Portable 1 View  Result Date: 12/06/2018 CLINICAL DATA:  Tachycardia.  Confusion.  Suspect sepsis. EXAM: PORTABLE CHEST 1 VIEW COMPARISON:  12/04/2018 FINDINGS: The heart is moderately enlarged. Normal vascularity. Subsegmental atelectasis at the left base. No pneumothorax. No consolidation. IMPRESSION: Cardiomegaly without decompensation. Electronically Signed   By: Marybelle Killings M.D.   On: 12/06/2018 17:19   US Thyroid  Result Date: 12/09/2018 CLINICAL DATA:  Incidental on CT. 73 year old female with multiple thyroid nodules seen on CT scan of the chest. EXAM: THYROID ULTRASOUND TECHNIQUE: Ultrasound examination of the thyroid gland and adjacent soft tissues was performed. COMPARISON:  CT scan of the chest 12/07/2018 FINDINGS: Parenchymal Echotexture: Moderately heterogenous Isthmus: 0.6 cm Right lobe: 6.2 x 2.2 x 2.8 cm Left lobe: 6.9 x 2.2 x 2.5 cm _________________________________________________________ Estimated total number of nodules >/= 1 cm: 6-10 Number of spongiform nodules >/=  2 cm not described below (TR1): 0 Number of mixed cystic and solid nodules >/= 1.5 cm not described below (TR2): 0  _________________________________________________________ Innumerable thyroid nodules, simple cysts and minimally complex mixed cystic and solid nodules bilaterally. All of the nodules measuring larger than 1 cm either represent simple cysts, or TI-RADS category 2 spongiform or isoechoic mixed cystic and solid nodules, none of which meet criteria for biopsy or further imaging follow-up. The only solid nodules all measure less than 0.9 cm and  also do not meet criteria for further evaluation. No nodules demonstrate suspicious features. IMPRESSION: Large multinodular goiter. None of the cysts or nodules meet criteria for biopsy or further imaging evaluation. No further follow-up required. The above is in keeping with the ACR TI-RADS recommendations - J Am Coll Radiol 2017;14:587-595. Electronically Signed   By: Jacqulynn Cadet M.D.   On: 12/09/2018 09:32   Xr Thoracic Spine 2 View  Result Date: 12/04/2018 X-rays thoracic spine: She has vertebroplasty changes at T8, 11 and 12 which are unchanged compared to CT scan from October 03, 2018.  There is a T7 compression fracture which was not treated with vertebroplasty but appears to be unchanged from November 2019.  I do not see any new compression deformities.     Assessment/Plan  1. Acute metabolic encephalopathy -  thought to be possibly from UTI and Baclofen with underlying dementia   2. E-coli UTI -continue Keflex 500 mg every 12 hours x3 days   3. CAD -S/P PCI Feb 2014 -stable, continue NTG as needed   4. Essential hypertension -continue metoprolol tartrate 50 mg 1 tab twice a day, Altace 10 mg 1 capsule daily   5. Chronic diastolic CHF (congestive heart failure) (HCC) -no S OB, continue Lasix 40 mg 1 tab daily   6. Type 2 diabetes mellitus with other specified complication, with long-term current use of insulin (HCC) -latest CBG 131, continue Lantus 100 unit/mL inject 10 units subcutaneously at bedtime, Actos 30 mg 1 tab at bedtime,  metformin 1000 mg twice a day, CBG twice daily   7. Other hyperlipidemia -continue Zocor 40 mg 1 tab at bedtime    Family/ staff Communication: Discussed plan of care with patient.  Labs/tests ordered:  BMP and hgbA1c  Goals of care:   Short-term rehabilitation.   Durenda Age, NP [facility] [phone] (Monday-Friday 8:00 a.m. - 5:00 p.m.) [phone] (after hours)

## 2018-12-13 ENCOUNTER — Encounter: Payer: Self-pay | Admitting: Internal Medicine

## 2018-12-13 ENCOUNTER — Non-Acute Institutional Stay (SKILLED_NURSING_FACILITY): Payer: Medicare Other | Admitting: Internal Medicine

## 2018-12-13 DIAGNOSIS — I712 Thoracic aortic aneurysm, without rupture, unspecified: Secondary | ICD-10-CM

## 2018-12-13 DIAGNOSIS — R0781 Pleurodynia: Secondary | ICD-10-CM | POA: Diagnosis not present

## 2018-12-13 DIAGNOSIS — R0789 Other chest pain: Secondary | ICD-10-CM

## 2018-12-13 DIAGNOSIS — N390 Urinary tract infection, site not specified: Secondary | ICD-10-CM

## 2018-12-13 DIAGNOSIS — I719 Aortic aneurysm of unspecified site, without rupture: Secondary | ICD-10-CM | POA: Insufficient documentation

## 2018-12-13 DIAGNOSIS — R4189 Other symptoms and signs involving cognitive functions and awareness: Secondary | ICD-10-CM

## 2018-12-13 DIAGNOSIS — G9341 Metabolic encephalopathy: Secondary | ICD-10-CM

## 2018-12-13 DIAGNOSIS — B9620 Unspecified Escherichia coli [E. coli] as the cause of diseases classified elsewhere: Secondary | ICD-10-CM

## 2018-12-13 DIAGNOSIS — R29818 Other symptoms and signs involving the nervous system: Secondary | ICD-10-CM

## 2018-12-13 LAB — BASIC METABOLIC PANEL
BUN: 28 — AB (ref 4–21)
Creatinine: 0.9 (ref 0.5–1.1)
Glucose: 147
Potassium: 4.9 (ref 3.4–5.3)
Sodium: 141 (ref 137–147)

## 2018-12-13 LAB — HEMOGLOBIN A1C: HEMOGLOBIN A1C: 6

## 2018-12-13 NOTE — Assessment & Plan Note (Signed)
Transitioned to oral Keflex ;as of 1/17 she will have completed a 7-day course of antibiotics

## 2018-12-13 NOTE — Assessment & Plan Note (Signed)
Dr. Azalia Bilis can schedule the annual CTA follow-up

## 2018-12-13 NOTE — Assessment & Plan Note (Addendum)
BIMS score 14 out of 15 which infers intact mentation.  She is adamant about leaving the facility and going home.  She states that [facility] will be attending her.  She states that the home has a ramp, toilet extension, and lift chair.  She has called the police requesting a ride home and is offered significant amounts of money to staff members to take her home. SLUMS testing which is a better MS assessment tool than BIMS will be performed to determine competency and any baseline neurocognitive deficit. She has been encouraged to stay in the facility for PT/OT ;at least until she is ambulatory.

## 2018-12-13 NOTE — Assessment & Plan Note (Signed)
Limit NSAID to less than 1 week total therapy.  Give Aleve only after food intake

## 2018-12-13 NOTE — Assessment & Plan Note (Signed)
Her PCP, Dr. Azalia Bilis will be notified of these findings

## 2018-12-13 NOTE — Patient Instructions (Signed)
See assessment and plan under each diagnosis in the problem list and acutely for this visit 

## 2018-12-13 NOTE — Progress Notes (Signed)
NURSING HOME LOCATION:  [facility] ROOM NUMBER:  [id number]  CODE STATUS:  DNR  PCP:  Shirline Frees, MD  [address] [place] [address]  This is a comprehensive admission note to [facility] performed on this date less than 30 days from date of admission. Included are preadmission medical/surgical history; reconciled medication list; family history; social history and comprehensive review of systems.  Corrections and additions to the records were documented. Comprehensive physical exam was also performed. Additionally a clinical summary was entered for each active diagnosis pertinent to this admission in the Problem List to enhance continuity of care.  HPI: Patient was hospitalized 1/9-1/14/2020 presenting with left inframammary chest pain present for 1 week.  She had seen her PCP a few days prior to admission, imaging was negative for any fracture.  Meloxicam and baclofen were prescribed.  The patient was found by friend who called EMS because of confusion and urinary and stool incontinence. Head CT was unremarkable.  Chest x-ray revealed no infiltrate. Troponin was mildly elevated.Echo revealed normal ejection fraction with no wall motion abnormalities.  She did have pain on that side to palpation, but CT of the chest revealed no rib fracture. An incidental finding was a 4.2 cm ascending aortic aneurysm for which annual follow-up with a CT angiogram or MRI was recommended. Urinalysis did suggest possible UTI. C&S revealed pansensitive E. coli; she was transitioned to oral Keflex. CT also suggested thyroid nodules.  Ultrasound revealed a large multinodular goiter.  Size did not meet the criteria for biopsy.  Her chest pain resolved with the Lidoderm patch.  This was continued at discharge along with Aleve. Lantus dosage was increased with improvement in the CBGs. It was postulated that her encephalopathic condition related to the UTI and possibly the baclofen.   She did have episodes of sundowning while in the hospital which improved with Seroquel.  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 As of 1/17 she would have completed a 7-day course of antibiotics for UTI.   Past medical and surgical history: History of SVT, rectal ulceration, myocardial infarction, essential hypertension, dyslipidemia, thoracic compression fracture, and coronary artery disease. Surgeries include coronary artery stenting, left hip fracture repair, kyphoplasty x3, and colonoscopy.  Social history: Nondrinker, non-smoker.  Family history: Limited history reviewed.  Family history is not contributory because of her advanced age.   Review of systems: The patient is adamant about leaving the facility and going home.  She has used [facility] in the past.  She also states that she has a ramp, toilet extension, and a lift at home.  She has become agitated to the point that she had called the police to come take her home.  She has also offered staff members $ 50-100 to take her home.  Despite this behavior she scored 14 on her BIMs mental status test which would be intact mentation.  She was somewhat unsure in reference to why she had been in the hospital.  She had no knowledge of the aneurysm.  She had some understanding that she had an issue with her thyroid on ultrasound.  She does describe residual left inframammary chest pain which responds to Tylenol.  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  She denies other active symptoms on review of systems.  Constitutional: No fever, significant weight change, fatigue  Eyes: No redness, discharge, pain, vision change ENT/mouth: No nasal  congestion, purulent discharge, earache, change in hearing, sore throat  Cardiovascular: No  palpitations, paroxysmal nocturnal dyspnea, claudication  Respiratory: No cough, sputum production, hemoptysis, DOE, significant snoring,  apnea Gastrointestinal: No heartburn, dysphagia, abdominal pain, nausea /vomiting, rectal bleeding, melena, change in bowels Genitourinary: No dysuria, hematuria, pyuria, incontinence, nocturia Musculoskeletal: No joint stiffness, joint swelling, weakness, pain Dermatologic: No rash, pruritus, change in appearance of skin Neurologic: No dizziness, headache, syncope, seizures, numbness, tingling Psychiatric: No significant anxiety, depression, insomnia, anorexia Endocrine: No change in hair/skin/nails, excessive thirst, excessive hunger, excessive urination  Hematologic/lymphatic: No significant bruising, lymphadenopathy, abnormal bleeding Allergy/immunology: No itchy/watery eyes, significant sneezing, urticaria, angioedema  Physical exam:  Pertinent or positive findings: She is alert and oriented X3.  She is morbidly obese.  She has an upper plate; she is not wearing her lower partial.  Visibly and on palpation the right thyroid lobe appears to be larger than the left in size.  Abdomen is protuberant.  Pedal pulses are decreased.  There is a large effusion suggested in the left patellar area.  She has dramatic edema/lymphedema of the ankles and feet.  Her upper extremities are stronger than the lower extremities to direct opposition.  General appearance:  no acute distress, increased work of breathing is present.   Lymphatic: No lymphadenopathy about the head, neck, axilla. Eyes: No conjunctival inflammation or lid edema is present. There is no scleral icterus. Ears:  External ear exam shows no significant lesions or deformities.   Nose:  External nasal examination shows no deformity or inflammation. Nasal mucosa are pink and moist without lesions, exudates Oral exam: Lips and gums are healthy appearing.There is no oropharyngeal erythema or exudate. Neck:  No  masses, tenderness noted.    Heart:  Normal rate and regular rhythm. S1 and S2 normal without gallop, murmur, click, rub.  Lungs: Chest  clear to auscultation without wheezes, rhonchi, rales, rubs. Abdomen: Bowel sounds are normal.  Abdomen is soft and nontender with no organomegaly, hernias, masses. GU: Deferred  Extremities:  No cyanosis, clubbing. Neurologic exam:  Balance, Rhomberg, finger to nose testing could not be completed due to clinical state Skin: Warm & dry w/o tenting. No significant lesions or rash.  See clinical summary under each active problem in the Problem List with associated updated therapeutic plan

## 2018-12-16 NOTE — ED Provider Notes (Signed)
[facility] Provider Note   CSN: [id number] Arrival date & time: 12/06/18  1601     History   Chief Complaint Chief Complaint  Patient presents with  . Altered Mental Status    HPI [name] is a 73 y.o. female.  HPI   73 y.o. female with history of CAD status post stenting in 4010, diastolic CHF last EF measured in August 2016 was 53 to 60% with grade 1 diastolic dysfunction, diabetes mellitus type 2, hypertension, hyperlipidemia who had undergone T12 kyphoplasty in 2016 has been experiencing some left upper thoracic pain for the last 1 week.  Had gone to her PCP on December 04, 2018 and had x-rays done which did not show any acute compression fractures.  Was prescribed meloxicam and baclofen which patient has been taking for the first time.  Earlier today patient's friend was trying to get in touch with her and was unable to reach her and so called the EMS and EMS found patient on the bed confused and had incontinence of urine and bowel.  Patient was brought to the ER.  Past Medical History:  Diagnosis Date  . Anxiety    Due to current back pain and has to lie flat.  . Arthritis   . CAD (coronary artery disease)    a. NSTEMI in setting of UTI and SVT 12/2012 => LHC 01/08/13: Proximal LAD 30%, ostial diagonal 30-40%, proximal RCA 95%, inferior AK, EF 45-50% => PCI: Promus DES x 2 to RCA;  b. Echo 01/05/13: EF 27-25%, grade 1 diastolic dysfunction, MAC   . Compression fracture    thoracic 12  . Edema   . History of blood transfusion   . Hypercholesteremia   . Hypertension   . Myocardial infarction ([place]) 2014  . Rectal ulceration    colonoscopy 12/2012  . SVT (supraventricular tachycardia) ([place])   . Type II diabetes mellitus [facility])     Patient Active Problem List   Diagnosis Date Noted  . Aortic aneurysm ([place]) 12/13/2018  . Neurocognitive deficits 12/13/2018  . Acute metabolic encephalopathy 36/64/4034  . Physical deconditioning 12/11/2018  . Acute  delirium 12/11/2018  . Rib pain on left side 12/11/2018  . E. coli UTI (urinary tract infection) 12/11/2018  . Uncontrolled type 2 diabetes mellitus with hyperglycemia ([place]) 12/06/2018  . Adjustment disorder with anxious mood 05/31/2016  . Tibia fracture after fall-s/p intramedulaary nailing 05/26/16 05/26/2016  . 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 Epiretinal membrane (ERM) of both eyes 01/26/2016  . Orthostatic hypotension   . Syncope and collapse 07/18/2015  . Compression fracture 07/15/2015  . Sepsis ([place]) 12/05/2014  . Constipation 12/04/2014  . Hematuria   . Intertrochanteric fracture of left hip ([place]) 11/14/2014  . Type 2 diabetes mellitus without complication ([place])   . 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 Contusion of right leg 11/13/2013  . HTN (hypertension) 11/12/2013  . Fall 11/12/2013  . CAD -S/P PCI Feb 2014 01/11/2013  . Oral thrush 01/11/2013  . Hematochezia 01/08/2013  . PSVT-post op 05/26/16 01/05/2013  . History of NSTEMI Feb 2014 01/05/2013  . Hypokalemia 01/04/2013  . Fever 01/04/2013  . H/O vertebroplasty 01/04/2013  . Proliferative diabetic retinopathy ([place]) 08/15/2012  . Pseudoaphakia 08/15/2012  . Cellulitis and abscess of foot 03/31/2012  . Dehydration 03/31/2012  . Hyponatremia 03/31/2012    Past Surgical History:  Procedure Laterality Date  . CATARACT EXTRACTION, BILATERAL Bilateral   . COLONOSCOPY Left 01/12/2013   Procedure: COLONOSCOPY;  Surgeon: Jilda Roche  Hinda Lenis, MD;  Location: [facility];  Service: Endoscopy;  Laterality: Left;  . COLONOSCOPY N/A 01/14/2013   Procedure: COLONOSCOPY;  Surgeon: Wonda Horner, MD;  Location: [facility] ENDOSCOPY;  Service: Endoscopy;  Laterality: N/A;  Rm 2034, attemped Colon on 2-15=inadequate prep  . CORONARY ANGIOPLASTY  12/2012  . EYE SURGERY Bilateral "many"  . FRACTURE SURGERY    . KYPHOPLASTY  12/27/2012   Procedure: KYPHOPLASTY;  Surgeon: Sinclair Ship, MD;  Location: [facility];  Service: Orthopedics;  Laterality:  Bilateral;  T-8 kyphoplasty  . KYPHOPLASTY N/A 07/15/2015   Procedure: T11 KYPHOPLASTY;  Surgeon: Phylliss Bob, MD;  Location: [facility];  Service: Orthopedics;  Laterality: N/A;  T11 kyphoplasty  . KYPHOPLASTY N/A 09/17/2015   Procedure: T12 kyphoplasty;  Surgeon: Phylliss Bob, MD;  Location: [facility];  Service: Orthopedics;  Laterality: N/A;  T12 kyphoplasty  . LEFT HEART CATHETERIZATION WITH CORONARY ANGIOGRAM N/A 01/08/2013   Procedure: LEFT HEART CATHETERIZATION WITH CORONARY ANGIOGRAM;  Surgeon: Thayer Headings, MD;  Location: [facility] CATH LAB;  Service: Cardiovascular;  Laterality: N/A;  . ORIF HIP FRACTURE Left 11/15/2014   Procedure: OPEN REDUCTION INTERNAL FIXATION HIP INTERTROCH;  Surgeon: Mcarthur Rossetti, MD;  Location: [facility];  Service: Orthopedics;  Laterality: Left;  . PERCUTANEOUS STENT INTERVENTION N/A 01/15/2013   Procedure: PERCUTANEOUS STENT INTERVENTION;  Surgeon: Peter M Martinique, MD;  Location: [facility] CATH LAB;  Service: Cardiovascular;  Laterality: N/A;  . TIBIA IM NAIL INSERTION Right 05/26/2016   Procedure: INTRAMEDULLARY (IM) NAIL TIBIAL;  Surgeon: Meredith Pel, MD;  Location: [facility];  Service: Orthopedics;  Laterality: Right;  . TUBAL LIGATION       OB History   No obstetric history on file.      Home Medications    Prior to Admission medications   Medication Sig Start Date End Date Taking? Authorizing Provider  calcium-vitamin D (OSCAL WITH D) 500-200 MG-UNIT tablet Take 1 tablet by mouth daily.    Yes [provider]  cholecalciferol (VITAMIN D3) 25 MCG (1000 UT) tablet Take 2,000 Units by mouth at bedtime.   Yes [provider]  furosemide (LASIX) 40 MG tablet Take 40 mg by mouth daily.    Yes [provider]  Insulin Glargine (LANTUS SOLOSTAR) 100 UNIT/ML Solostar Pen Inject 10 Units into the skin at bedtime.   Yes [provider]  metFORMIN (GLUCOPHAGE) 1000 MG tablet Take 1,000 mg by mouth 2 (two) times daily.  08/26/15  Yes  [provider]  metoprolol (LOPRESSOR) 50 MG tablet Take 1 tablet (50 mg total) by mouth 2 (two) times daily. PLEASE CONTACT OFFICE FOR ADDITIONAL REFILLS 09/29/16  Yes Minus Breeding, MD  Multiple Vitamin (MULTIVITAMIN WITH MINERALS) TABS tablet Take 1 tablet by mouth daily.    Yes [provider]  naproxen sodium (ALEVE) 220 MG tablet Take 220 mg by mouth 2 (two) times daily as needed (pain).   Yes [provider]  ondansetron (ZOFRAN ODT) 4 MG disintegrating tablet Take 1 tablet (4 mg total) by mouth every 8 (eight) hours as needed for nausea or vomiting. 06/28/15  Yes Jule Ser, DO  pioglitazone (ACTOS) 30 MG tablet Take 30 mg by mouth at bedtime.  09/08/15  Yes [provider]  ramipril (ALTACE) 10 MG capsule Take 10 mg by mouth at bedtime.  08/26/15  Yes [provider]  simvastatin (ZOCOR) 40 MG tablet Take 40 mg by mouth at bedtime.    Yes [provider]  vitamin B-12 (CYANOCOBALAMIN) 1000 MCG tablet Take 1,000 mcg by mouth daily.   Yes [provider]  acetaminophen (TYLENOL) 325 MG tablet Take 650 mg by mouth every 6 (six) hours as needed.    [provider]  lidocaine (LIDODERM) 5 % Place 1 patch onto the skin daily. Remove & Discard patch within 12 hours or as directed by MD . Apply over left lateral chest 12/11/18   Dhungel, Nishant, MD  nitroGLYCERIN (NITROSTAT) 0.4 MG SL tablet Place 1 tablet (0.4 mg total) under the tongue every 5 (five) minutes as needed for chest pain. 04/16/13   Liliane Shi, PA-C    Family History Family History  Problem Relation Age of Onset  . Hypertension Brother     Social History Social History   Tobacco Use  . Smoking status: Never Smoker  . Smokeless tobacco: Never Used  Substance Use Topics  . Alcohol use: No  . Drug use: No     Allergies   Oxycodone   Review of Systems Review of Systems  Level 5 caveat because of confusion.   Physical Exam Updated  Vital Signs BP (!) 126/92 (BP Location: Left Arm)   Pulse 73   Temp 98.3 F (36.8 C) (Oral)   Resp 17   Wt 110 kg   SpO2 96%   BMI 36.34 kg/m   Physical Exam Vitals signs and nursing note reviewed.  Constitutional:      General: She is not in acute distress.    Appearance: She is well-developed.  HENT:     Head: Normocephalic and atraumatic.  Eyes:     General:        Right eye: No discharge.        Left eye: No discharge.     Conjunctiva/sclera: Conjunctivae normal.  Neck:     Musculoskeletal: Neck supple.  Cardiovascular:     Rate and Rhythm: Regular rhythm. Tachycardia present.     Heart sounds: Normal heart sounds. No murmur. No friction rub. No gallop.   Pulmonary:     Effort: Pulmonary effort is normal. No respiratory distress.     Breath sounds: Normal breath sounds.  Abdominal:     General: There is no distension.     Palpations: Abdomen is soft.     Tenderness: There is no abdominal tenderness.  Musculoskeletal:        General: No tenderness.  Skin:    General: Skin is warm and dry.  Neurological:     Mental Status: She is alert. She is disoriented.     Sensory: No sensory deficit.     Motor: No weakness.  Psychiatric:        Behavior: Behavior normal.        Thought Content: Thought content normal.      ED Treatments / Results  Labs (all labs ordered are listed, but only abnormal results are displayed) Labs Reviewed  URINE CULTURE - Abnormal; Notable for the following components:      Result Value   Culture >=100,000 COLONIES/mL ESCHERICHIA COLI (*)    Organism ID, Bacteria ESCHERICHIA COLI (*)    All other components within normal limits  CBC WITH DIFFERENTIAL/PLATELET - Abnormal; Notable for the following components:   Hemoglobin 15.2 (*)    HCT 48.6 (*)    All other components within normal limits  COMPREHENSIVE METABOLIC PANEL - Abnormal; Notable for the following components:   Glucose, Bld 202 (*)    Total Bilirubin 1.6 (*)  All other  components within normal limits  URINALYSIS, ROUTINE W REFLEX MICROSCOPIC - Abnormal; Notable for the following components:   APPearance CLOUDY (*)    Glucose, UA 50 (*)    Hgb urine dipstick SMALL (*)    Ketones, ur 80 (*)    Protein, ur 100 (*)    Leukocytes, UA LARGE (*)    WBC, UA >50 (*)    Bacteria, UA MANY (*)    All other components within normal limits  CBC WITH DIFFERENTIAL/PLATELET - Abnormal; Notable for the following components:   Monocytes Absolute 1.5 (*)    All other components within normal limits  COMPREHENSIVE METABOLIC PANEL - Abnormal; Notable for the following components:   Potassium 2.9 (*)    Glucose, Bld 159 (*)    Calcium 8.6 (*)    Total Protein 6.1 (*)    Albumin 2.8 (*)    All other components within normal limits  TROPONIN I - Abnormal; Notable for the following components:   Troponin I 0.15 (*)    All other components within normal limits  TROPONIN I - Abnormal; Notable for the following components:   Troponin I 0.18 (*)    All other components within normal limits  TROPONIN I - Abnormal; Notable for the following components:   Troponin I 0.13 (*)    All other components within normal limits  GLUCOSE, CAPILLARY - Abnormal; Notable for the following components:   Glucose-Capillary 146 (*)    All other components within normal limits  GLUCOSE, CAPILLARY - Abnormal; Notable for the following components:   Glucose-Capillary 125 (*)    All other components within normal limits  GLUCOSE, CAPILLARY - Abnormal; Notable for the following components:   Glucose-Capillary 221 (*)    All other components within normal limits  GLUCOSE, CAPILLARY - Abnormal; Notable for the following components:   Glucose-Capillary 117 (*)    All other components within normal limits  POTASSIUM - Abnormal; Notable for the following components:   Potassium 3.4 (*)    All other components within normal limits  BASIC METABOLIC PANEL - Abnormal; Notable for the following  components:   Potassium 3.2 (*)    Glucose, Bld 122 (*)    Creatinine, Ser 1.02 (*)    GFR calc non Af Amer 54 (*)    All other components within normal limits  GLUCOSE, CAPILLARY - Abnormal; Notable for the following components:   Glucose-Capillary 218 (*)    All other components within normal limits  GLUCOSE, CAPILLARY - Abnormal; Notable for the following components:   Glucose-Capillary 257 (*)    All other components within normal limits  GLUCOSE, CAPILLARY - Abnormal; Notable for the following components:   Glucose-Capillary 149 (*)    All other components within normal limits  GLUCOSE, CAPILLARY - Abnormal; Notable for the following components:   Glucose-Capillary 138 (*)    All other components within normal limits  GLUCOSE, CAPILLARY - Abnormal; Notable for the following components:   Glucose-Capillary 177 (*)    All other components within normal limits  GLUCOSE, CAPILLARY - Abnormal; Notable for the following components:   Glucose-Capillary 205 (*)    All other components within normal limits  GLUCOSE, CAPILLARY - Abnormal; Notable for the following components:   Glucose-Capillary 131 (*)    All other components within normal limits  GLUCOSE, CAPILLARY - Abnormal; Notable for the following components:   Glucose-Capillary 237 (*)    All other components within normal limits  GLUCOSE, CAPILLARY -  Abnormal; Notable for the following components:   Glucose-Capillary 123 (*)    All other components within normal limits  GLUCOSE, CAPILLARY - Abnormal; Notable for the following components:   Glucose-Capillary 132 (*)    All other components within normal limits  GLUCOSE, CAPILLARY - Abnormal; Notable for the following components:   Glucose-Capillary 187 (*)    All other components within normal limits  GLUCOSE, CAPILLARY - Abnormal; Notable for the following components:   Glucose-Capillary 138 (*)    All other components within normal limits  GLUCOSE, CAPILLARY - Abnormal;  Notable for the following components:   Glucose-Capillary 172 (*)    All other components within normal limits  GLUCOSE, CAPILLARY - Abnormal; Notable for the following components:   Glucose-Capillary 122 (*)    All other components within normal limits  GLUCOSE, CAPILLARY - Abnormal; Notable for the following components:   Glucose-Capillary 164 (*)    All other components within normal limits  CBG MONITORING, ED - Abnormal; Notable for the following components:   Glucose-Capillary 181 (*)    All other components within normal limits  CULTURE, BLOOD (ROUTINE X 2)  CULTURE, BLOOD (ROUTINE X 2)  LIPASE, BLOOD  INFLUENZA PANEL BY PCR (TYPE A & B)  TSH  RAPID URINE DRUG SCREEN, HOSP PERFORMED    EKG EKG Interpretation  Date/Time:  Thursday December 06 2018 16:11:53 EST Ventricular Rate:  135 PR Interval:    QRS Duration: 89 QT Interval:  329 QTC Calculation: 494 R Axis:   -10 Text Interpretation:  Sinus tachycardia Atrial premature complex Left ventricular hypertrophy Borderline repolarization abnormality Borderline prolonged QT interval Confirmed by Virgel Manifold [phone]) on 12/06/2018 9:00:39 PM   Radiology No results found.  Procedures Procedures (including critical care time)  Medications Ordered in ED Medications  lactated ringers bolus 1,000 mL (0 mLs Intravenous Stopped 12/06/18 1913)  piperacillin-tazobactam (ZOSYN) IVPB 3.375 g (0 g Intravenous Stopped 12/06/18 1852)  vancomycin (VANCOCIN) 2,000 mg in sodium chloride 0.9 % 500 mL IVPB (0 mg Intravenous Stopped 12/06/18 2023)  iopamidol (ISOVUE-370) 76 % injection 100 mL (100 mLs Intravenous Contrast Given 12/07/18 0237)  potassium chloride (KLOR-CON) packet 40 mEq (40 mEq Oral Given 12/07/18 1022)  potassium chloride 10 mEq in 100 mL IVPB (10 mEq Intravenous New Bag/Given 12/07/18 1154)  potassium chloride SA (K-DUR,KLOR-CON) CR tablet 40 mEq (40 mEq Oral Given 12/08/18 0820)     Initial Impression / Assessment and Plan /  ED Course  I have reviewed the triage vital signs and the nursing notes.  Pertinent labs & imaging results that were available during my care of the patient were reviewed by me and considered in my medical decision making (see chart for details).     72yF with AMS. Likely form UTI. Abx. Admit.   Final Clinical Impressions(s) / ED Diagnoses   Final diagnoses:  Altered mental status, unspecified altered mental status type  Lower urinary tract infectious disease    ED Discharge Orders         Ordered    cephALEXin (KEFLEX) 500 MG capsule  Every 12 hours     12/11/18 1309    lidocaine (LIDODERM) 5 %  Every 24 hours     12/11/18 1309           Virgel Manifold, MD 12/16/18 1619

## 2019-01-01 ENCOUNTER — Non-Acute Institutional Stay (SKILLED_NURSING_FACILITY): Payer: Medicare Other | Admitting: Internal Medicine

## 2019-01-01 ENCOUNTER — Encounter: Payer: Self-pay | Admitting: Internal Medicine

## 2019-01-01 DIAGNOSIS — J208 Acute bronchitis due to other specified organisms: Secondary | ICD-10-CM

## 2019-01-01 DIAGNOSIS — E1165 Type 2 diabetes mellitus with hyperglycemia: Secondary | ICD-10-CM | POA: Diagnosis not present

## 2019-01-01 DIAGNOSIS — J209 Acute bronchitis, unspecified: Secondary | ICD-10-CM

## 2019-01-01 LAB — BASIC METABOLIC PANEL
BUN: 44 — AB (ref 4–21)
CREATININE: 0.7 (ref 0.5–1.1)
Glucose: 137
Potassium: 3.8 (ref 3.4–5.3)
Sodium: 135 — AB (ref 137–147)

## 2019-01-01 LAB — CBC AND DIFFERENTIAL
HCT: 38 (ref 36–46)
Hemoglobin: 13.4 (ref 12.0–16.0)
Neutrophils Absolute: 8
Platelets: 258 (ref 150–399)
WBC: 10.6

## 2019-01-01 NOTE — Patient Instructions (Addendum)
See assessment and plan under each diagnosis in the problem list and acutely for this visit Total time 42  minutes; greater than 50% of the visit spent  coordinating care for problems addressed at this encounter

## 2019-01-01 NOTE — Progress Notes (Signed)
NURSING HOME LOCATION:  [facility] ROOM NUMBER:  120-A  CODE STATUS:  DNR  PCP:  Shirline Frees, MD  [place] [address]  This is a nursing facility follow up for specific acute issue of mental status change.  Interim medical record and care since last [place] visit was updated with review of diagnostic studies and change in clinical status since last visit were documented.  HPI: [facility] NP assessed the patient because of an isolated glucose of 68. Mental status changes were noted; but hypoglycemia was not thought to be etiology of mental status changes . The NP documented significant change in her memory compared to prior evaluation. The 68 was the lowest recorded glucose.  Other values ranged from 84 fasting to a high of 217.  The latter value was an outlier.  All other values are less than 153. She also found that the patient was exhibiting some anorexia and a rattly, nonproductive cough.  Chest x-ray revealed accentuated upper lobe markings as well as a possible infiltrate in the left lower lobe with an effusion.  [facility] films 1/9 did show blunting of the left costophrenic angle and cardiomegaly. Labs are pending at this time.  Review of systems: She states that she has had laryngitis for a week.  She states that she is coughing up yellow phlegm.  She does have a decreased appetite and nausea and cannot eat her breakfast.  She stated she had a low blood sugar, but she cannot give me the value.  Constitutional: No fever, significant weight change, fatigue  Eyes: No redness, discharge, pain, vision change ENT/mouth: No nasal congestion,  purulent discharge, earache, change in hearing, sore throat  Cardiovascular: No chest pain, palpitations, paroxysmal nocturnal dyspnea, claudication, edema  Respiratory: No cough hemoptysis Gastrointestinal: No heartburn, dysphagia, abdominal pain, nausea /vomiting, rectal bleeding, melena, change in  bowels Genitourinary: No dysuria, hematuria, pyuria, incontinence, nocturia Musculoskeletal: No joint stiffness, joint swelling, weakness, pain Dermatologic: No rash, pruritus, change in appearance of skin Neurologic: No dizziness, headache, syncope, seizures, numbness, tingling Psychiatric: No significant anxiety, depression, insomnia Endocrine: No change in hair/skin/nails, excessive thirst, excessive hunger, excessive urination  Hematologic/lymphatic: No significant bruising, lymphadenopathy, abnormal bleeding Allergy/immunology: No itchy/watery eyes, significant sneezing, urticaria, angioedema  Physical exam:  Pertinent or positive findings: She does appear ill. Ptosis is present on the right.  She has an upper plate and a few lower mandibular teeth.  Rhonchi are noted anteriorly.  Bronchovesicular breath sounds are suggested in the left lower lobe. Heart rhythm varies with an intermittent slight gallop as well as intermittent irregular beat.  Posterior tibial pedal pulses are decreased.  Fusiform changes of the knees are dramatic.  She has massive subcutaneous lymphedematous changes of the ankles.  General appearance: Obese; no acute distress, increased work of breathing is present.   Lymphatic: No lymphadenopathy about the head, neck, axilla. Eyes: No conjunctival inflammation or lid edema is present. There is no scleral icterus. Ears:  External ear exam shows no significant lesions or deformities.   Nose:  External nasal examination shows no deformity or inflammation. Nasal mucosa are pink and moist without lesions, exudates Oral exam:  Lips and gums are healthy appearing. There is no oropharyngeal erythema or exudate. Neck:  No thyromegaly, masses, tenderness noted.    Heart:  No murmur, click, rub .  Abdomen: Bowel sounds are normal. Abdomen is soft and nontender with no organomegaly, hernias, masses. GU: Deferred  Extremities:  No cyanosis,  clubbing  Balance, Rhomberg, finger to  nose testing could not be completed due to clinical state Skin: Warm & dry w/o tenting. No significant lesions or rash.  See summary under each active problem in the Problem List with associated updated therapeutic plan

## 2019-01-02 ENCOUNTER — Encounter: Payer: Self-pay | Admitting: Internal Medicine

## 2019-01-02 NOTE — Assessment & Plan Note (Signed)
Current A1c 6% on triple therapy; glucoses as low as 68 Hold metformin until anorexia resolves Stop Actos due to cardiovascular risks, esp edema

## 2019-01-07 LAB — BASIC METABOLIC PANEL
BUN: 63 — AB (ref 4–21)
Creatinine: 1.3 — AB (ref 0.5–1.1)
Glucose: 229
POTASSIUM: 4.5 (ref 3.4–5.3)
Sodium: 135 — AB (ref 137–147)

## 2019-01-10 ENCOUNTER — Encounter: Payer: Self-pay | Admitting: Internal Medicine

## 2019-01-10 ENCOUNTER — Non-Acute Institutional Stay (SKILLED_NURSING_FACILITY): Payer: Medicare Other | Admitting: Internal Medicine

## 2019-01-10 DIAGNOSIS — R7989 Other specified abnormal findings of blood chemistry: Secondary | ICD-10-CM

## 2019-01-10 DIAGNOSIS — R197 Diarrhea, unspecified: Secondary | ICD-10-CM | POA: Diagnosis not present

## 2019-01-10 NOTE — Patient Instructions (Signed)
See assessment and plan under each diagnosis in the problem list and acutely for this visit 

## 2019-01-10 NOTE — Progress Notes (Signed)
NURSING HOME LOCATION:  [facility] ROOM NUMBER:  120-A  CODE STATUS:  DNR  PCP:  Shirline Frees, MD  [place] [address]   This is a nursing facility follow up for specific acute issue of significant prerenal azotemia.  Interim medical record and care since last [place] visit was updated with review of diagnostic studies and change in clinical status since last visit were documented.  HPI: [facility] NP saw the patient 2/12 for vomiting, progressive weakness, and poor oral intake.  Glucoses were stable in the 120-239 range but creatinine was 1.33 and BUN 62.9.  Attempts were made to administer 1 L of normal saline but the IV infiltrated.  Lasix was held for 24 hours.  C. difficile was ordered because of staff reports of diarrhea.  PT/OT reported to me today that the patient actually has had diarrhea for 2 weeks.  Patient had been refusing to work with PT/OT.  Today they were able to get her to sit on the side of the bed for a short period of time.  Review of systems: She states she is tired and "overall yucky".  She states that she just does not feel good.  She was unable to elaborate. She denies diarrhea.  She states that her rectum is sore with BMs.  She denies melena or rectal bleeding.  She does describe some dysuria.  Constitutional: No fever, significant weight change Eyes: No redness, discharge, pain, vision change ENT/mouth: No nasal congestion,  purulent discharge, earache, change in hearing, sore throat  Cardiovascular: No chest pain, palpitations, paroxysmal nocturnal dyspnea, claudication, edema  Respiratory: No cough, sputum production, hemoptysis, DOE, significant snoring, apnea   Gastrointestinal: No heartburn, dysphagia, abdominal pain, nausea /vomiting, rectal bleeding, melena, change in bowels Genitourinary: No  hematuria, pyuria, incontinence, nocturia Musculoskeletal: No joint stiffness, joint swelling, weakness,  pain Dermatologic: No rash, pruritus, change in appearance of skin Neurologic: No dizziness, headache, syncope, seizures, numbness, tingling Psychiatric: No significant anxiety, depression, insomnia, anorexia Endocrine: No change in hair/skin/nails, excessive thirst, excessive hunger, excessive urination  Hematologic/lymphatic: No significant bruising, lymphadenopathy, abnormal bleeding Allergy/immunology: No itchy/watery eyes, significant sneezing, urticaria, angioedema  Physical exam:  Pertinent or positive findings:  She is morbidly obese.She appears markedly pale.  Subcutaneous deficit over the upper forehead is suggested. Ptosis is present in the right eye greater than the left.  She has an upper plate.  She has few lower remaining teeth.  She is not wearing her lower partial.  She has slight tachyarrhythmia.  Pedal pulses are decreased, especially posterior tibial pulses.  She has marked lipomatous changes of the ankles.  She has an intermittent jerking tremor of the right hand.  General appearance: no acute distress, increased work of breathing is present.   Lymphatic: No lymphadenopathy about the head, neck, axilla. Eyes: No conjunctival inflammation or lid edema is present. There is no scleral icterus. Ears:  External ear exam shows no significant lesions or deformities.   Nose:  External nasal examination shows no deformity or inflammation. Nasal mucosa are pink and moist without lesions, exudates Oral exam:  Lips and gums are healthy appearing. There is no oropharyngeal erythema or exudate. Neck:  No thyromegaly, masses, tenderness noted.    Heart:  No murmur, click, rub .  Lungs: Chest clear to auscultation without wheezes, rhonchi, rales, rubs. Abdomen: Bowel sounds are normal. Abdomen is soft and nontender with no organomegaly, hernias, masses. GU: Deferred  Extremities:  No cyanosis, clubbing  Neurologic exam : Balance, Rhomberg, finger to nose testing could not be completed  due to clinical state Skin: Warm & dry w/o tenting. No significant visible lesions or rash.  See summary under each active problem in the Problem List with associated updated therapeutic plan

## 2019-01-12 ENCOUNTER — Encounter: Payer: Self-pay | Admitting: Internal Medicine

## 2019-01-21 ENCOUNTER — Inpatient Hospital Stay (HOSPITAL_COMMUNITY)
Admission: EM | Admit: 2019-01-21 | Discharge: 2019-02-08 | DRG: 871 | Disposition: A | Discharge status: Skilled Nursing Facility | Payer: Medicare Other | Source: Skilled Nursing Facility | Attending: Internal Medicine | Admitting: Internal Medicine

## 2019-01-21 ENCOUNTER — Emergency Department (HOSPITAL_COMMUNITY): Payer: Medicare Other

## 2019-01-21 ENCOUNTER — Encounter (HOSPITAL_COMMUNITY): Payer: Self-pay | Admitting: Internal Medicine

## 2019-01-21 DIAGNOSIS — Z794 Long term (current) use of insulin: Secondary | ICD-10-CM | POA: Diagnosis not present

## 2019-01-21 DIAGNOSIS — B957 Other staphylococcus as the cause of diseases classified elsewhere: Secondary | ICD-10-CM | POA: Diagnosis present

## 2019-01-21 DIAGNOSIS — E876 Hypokalemia: Secondary | ICD-10-CM | POA: Diagnosis not present

## 2019-01-21 DIAGNOSIS — I2510 Atherosclerotic heart disease of native coronary artery without angina pectoris: Secondary | ICD-10-CM | POA: Diagnosis present

## 2019-01-21 DIAGNOSIS — R1310 Dysphagia, unspecified: Secondary | ICD-10-CM | POA: Diagnosis present

## 2019-01-21 DIAGNOSIS — N320 Bladder-neck obstruction: Secondary | ICD-10-CM | POA: Diagnosis present

## 2019-01-21 DIAGNOSIS — Z66 Do not resuscitate: Secondary | ICD-10-CM | POA: Diagnosis present

## 2019-01-21 DIAGNOSIS — K521 Toxic gastroenteritis and colitis: Secondary | ICD-10-CM | POA: Diagnosis not present

## 2019-01-21 DIAGNOSIS — A419 Sepsis, unspecified organism: Secondary | ICD-10-CM | POA: Diagnosis not present

## 2019-01-21 DIAGNOSIS — N179 Acute kidney failure, unspecified: Secondary | ICD-10-CM

## 2019-01-21 DIAGNOSIS — I959 Hypotension, unspecified: Secondary | ICD-10-CM | POA: Diagnosis not present

## 2019-01-21 DIAGNOSIS — E861 Hypovolemia: Secondary | ICD-10-CM | POA: Diagnosis not present

## 2019-01-21 DIAGNOSIS — E1165 Type 2 diabetes mellitus with hyperglycemia: Secondary | ICD-10-CM | POA: Diagnosis not present

## 2019-01-21 DIAGNOSIS — R5381 Other malaise: Secondary | ICD-10-CM | POA: Diagnosis not present

## 2019-01-21 DIAGNOSIS — E7800 Pure hypercholesterolemia, unspecified: Secondary | ICD-10-CM | POA: Diagnosis present

## 2019-01-21 DIAGNOSIS — I471 Supraventricular tachycardia: Secondary | ICD-10-CM | POA: Diagnosis present

## 2019-01-21 DIAGNOSIS — M549 Dorsalgia, unspecified: Secondary | ICD-10-CM | POA: Diagnosis present

## 2019-01-21 DIAGNOSIS — R4182 Altered mental status, unspecified: Secondary | ICD-10-CM | POA: Diagnosis not present

## 2019-01-21 DIAGNOSIS — I252 Old myocardial infarction: Secondary | ICD-10-CM

## 2019-01-21 DIAGNOSIS — G9341 Metabolic encephalopathy: Secondary | ICD-10-CM | POA: Diagnosis present

## 2019-01-21 DIAGNOSIS — E860 Dehydration: Secondary | ICD-10-CM | POA: Diagnosis not present

## 2019-01-21 DIAGNOSIS — R6520 Severe sepsis without septic shock: Secondary | ICD-10-CM | POA: Diagnosis not present

## 2019-01-21 DIAGNOSIS — Z9841 Cataract extraction status, right eye: Secondary | ICD-10-CM

## 2019-01-21 DIAGNOSIS — E042 Nontoxic multinodular goiter: Secondary | ICD-10-CM | POA: Diagnosis present

## 2019-01-21 DIAGNOSIS — E870 Hyperosmolality and hypernatremia: Secondary | ICD-10-CM | POA: Diagnosis not present

## 2019-01-21 DIAGNOSIS — E871 Hypo-osmolality and hyponatremia: Secondary | ICD-10-CM | POA: Diagnosis present

## 2019-01-21 DIAGNOSIS — Z1612 Extended spectrum beta lactamase (ESBL) resistance: Secondary | ICD-10-CM | POA: Diagnosis present

## 2019-01-21 DIAGNOSIS — N19 Unspecified kidney failure: Secondary | ICD-10-CM | POA: Diagnosis present

## 2019-01-21 DIAGNOSIS — I9589 Other hypotension: Secondary | ICD-10-CM

## 2019-01-21 DIAGNOSIS — E11649 Type 2 diabetes mellitus with hypoglycemia without coma: Secondary | ICD-10-CM | POA: Diagnosis present

## 2019-01-21 DIAGNOSIS — R531 Weakness: Secondary | ICD-10-CM

## 2019-01-21 DIAGNOSIS — M1990 Unspecified osteoarthritis, unspecified site: Secondary | ICD-10-CM | POA: Diagnosis present

## 2019-01-21 DIAGNOSIS — I10 Essential (primary) hypertension: Secondary | ICD-10-CM | POA: Diagnosis present

## 2019-01-21 DIAGNOSIS — N390 Urinary tract infection, site not specified: Secondary | ICD-10-CM | POA: Diagnosis present

## 2019-01-21 DIAGNOSIS — I714 Abdominal aortic aneurysm, without rupture: Secondary | ICD-10-CM | POA: Diagnosis present

## 2019-01-21 DIAGNOSIS — Z79899 Other long term (current) drug therapy: Secondary | ICD-10-CM

## 2019-01-21 DIAGNOSIS — E785 Hyperlipidemia, unspecified: Secondary | ICD-10-CM | POA: Diagnosis present

## 2019-01-21 DIAGNOSIS — I110 Hypertensive heart disease with heart failure: Secondary | ICD-10-CM | POA: Diagnosis present

## 2019-01-21 DIAGNOSIS — E872 Acidosis: Secondary | ICD-10-CM | POA: Diagnosis present

## 2019-01-21 DIAGNOSIS — Z8249 Family history of ischemic heart disease and other diseases of the circulatory system: Secondary | ICD-10-CM

## 2019-01-21 DIAGNOSIS — B356 Tinea cruris: Secondary | ICD-10-CM | POA: Diagnosis present

## 2019-01-21 DIAGNOSIS — N171 Acute kidney failure with acute cortical necrosis: Secondary | ICD-10-CM | POA: Diagnosis not present

## 2019-01-21 DIAGNOSIS — I5032 Chronic diastolic (congestive) heart failure: Secondary | ICD-10-CM | POA: Diagnosis present

## 2019-01-21 DIAGNOSIS — R571 Hypovolemic shock: Secondary | ICD-10-CM | POA: Diagnosis not present

## 2019-01-21 DIAGNOSIS — B964 Proteus (mirabilis) (morganii) as the cause of diseases classified elsewhere: Secondary | ICD-10-CM | POA: Diagnosis not present

## 2019-01-21 DIAGNOSIS — Z9842 Cataract extraction status, left eye: Secondary | ICD-10-CM

## 2019-01-21 DIAGNOSIS — F0390 Unspecified dementia without behavioral disturbance: Secondary | ICD-10-CM | POA: Diagnosis present

## 2019-01-21 DIAGNOSIS — A411 Sepsis due to other specified staphylococcus: Secondary | ICD-10-CM | POA: Diagnosis present

## 2019-01-21 DIAGNOSIS — Z885 Allergy status to narcotic agent status: Secondary | ICD-10-CM | POA: Diagnosis not present

## 2019-01-21 DIAGNOSIS — F064 Anxiety disorder due to known physiological condition: Secondary | ICD-10-CM | POA: Diagnosis present

## 2019-01-21 DIAGNOSIS — R7881 Bacteremia: Secondary | ICD-10-CM | POA: Diagnosis not present

## 2019-01-21 DIAGNOSIS — Y92239 Unspecified place in hospital as the place of occurrence of the external cause: Secondary | ICD-10-CM | POA: Diagnosis not present

## 2019-01-21 DIAGNOSIS — Z955 Presence of coronary angioplasty implant and graft: Secondary | ICD-10-CM

## 2019-01-21 DIAGNOSIS — T3695XA Adverse effect of unspecified systemic antibiotic, initial encounter: Secondary | ICD-10-CM | POA: Diagnosis not present

## 2019-01-21 LAB — BASIC METABOLIC PANEL
Anion gap: 16 — ABNORMAL HIGH (ref 5–15)
BUN: 124 mg/dL — ABNORMAL HIGH (ref 8–23)
CO2: 13 mmol/L — AB (ref 22–32)
Calcium: 7.9 mg/dL — ABNORMAL LOW (ref 8.9–10.3)
Chloride: 106 mmol/L (ref 98–111)
Creatinine, Ser: 3.99 mg/dL — ABNORMAL HIGH (ref 0.44–1.00)
GFR calc Af Amer: 12 mL/min — ABNORMAL LOW (ref >60)
GFR calc non Af Amer: 10 mL/min — ABNORMAL LOW (ref >60)
Glucose, Bld: 120 mg/dL — ABNORMAL HIGH (ref 70–99)
Potassium: 4.8 mmol/L (ref 3.5–5.1)
Sodium: 135 mmol/L (ref 135–145)

## 2019-01-21 LAB — COMPREHENSIVE METABOLIC PANEL
ALT: 33 U/L (ref 0–44)
ANION GAP: 19 — AB (ref 5–15)
AST: 23 U/L (ref 15–41)
Albumin: 2.2 g/dL — ABNORMAL LOW (ref 3.5–5.0)
Alkaline Phosphatase: 59 U/L (ref 38–126)
BUN: 139 mg/dL — ABNORMAL HIGH (ref 8–23)
CO2: 15 mmol/L — ABNORMAL LOW (ref 22–32)
Calcium: 8.7 mg/dL — ABNORMAL LOW (ref 8.9–10.3)
Chloride: 95 mmol/L — ABNORMAL LOW (ref 98–111)
Creatinine, Ser: 4.89 mg/dL — ABNORMAL HIGH (ref 0.44–1.00)
GFR calc Af Amer: 9 mL/min — ABNORMAL LOW (ref >60)
GFR calc non Af Amer: 8 mL/min — ABNORMAL LOW (ref >60)
Glucose, Bld: 216 mg/dL — ABNORMAL HIGH (ref 70–99)
Potassium: 4.9 mmol/L (ref 3.5–5.1)
Sodium: 129 mmol/L — ABNORMAL LOW (ref 135–145)
TOTAL PROTEIN: 6.7 g/dL (ref 6.5–8.1)
Total Bilirubin: 0.9 mg/dL (ref 0.3–1.2)

## 2019-01-21 LAB — URINALYSIS, MICROSCOPIC (REFLEX): WBC, UA: >50 WBC/hpf (ref 0–5)

## 2019-01-21 LAB — CBC WITH DIFFERENTIAL/PLATELET
Abs Immature Granulocytes: 0.07 10*3/uL (ref 0.00–0.07)
Basophils Absolute: 0 10*3/uL (ref 0.0–0.1)
Basophils Relative: 0 %
Eosinophils Absolute: 0.1 10*3/uL (ref 0.0–0.5)
Eosinophils Relative: 1 %
HCT: 42.3 % (ref 36.0–46.0)
Hemoglobin: 13.8 g/dL (ref 12.0–15.0)
Immature Granulocytes: 1 %
Lymphocytes Relative: 9 %
Lymphs Abs: 1.1 10*3/uL (ref 0.7–4.0)
MCH: 29.8 pg (ref 26.0–34.0)
MCHC: 32.6 g/dL (ref 30.0–36.0)
MCV: 91.4 fL (ref 80.0–100.0)
Monocytes Absolute: 1.1 10*3/uL — ABNORMAL HIGH (ref 0.1–1.0)
Monocytes Relative: 9 %
Neutro Abs: 10 10*3/uL — ABNORMAL HIGH (ref 1.7–7.7)
Neutrophils Relative %: 80 %
Platelets: 180 10*3/uL (ref 150–400)
RBC: 4.63 MIL/uL (ref 3.87–5.11)
RDW: 13.6 % (ref 11.5–15.5)
WBC: 12.3 10*3/uL — ABNORMAL HIGH (ref 4.0–10.5)
nRBC: 0 % (ref 0.0–0.2)

## 2019-01-21 LAB — URINALYSIS, ROUTINE W REFLEX MICROSCOPIC
Bilirubin Urine: NEGATIVE
Glucose, UA: NEGATIVE mg/dL
Ketones, ur: NEGATIVE mg/dL
Nitrite: NEGATIVE
Protein, ur: 100 mg/dL — AB
Specific Gravity, Urine: 1.02 (ref 1.005–1.030)
pH: 8 (ref 5.0–8.0)

## 2019-01-21 LAB — I-STAT TROPONIN, ED: Troponin i, poc: 0.03 ng/mL (ref 0.00–0.08)

## 2019-01-21 LAB — LACTIC ACID, PLASMA
Lactic Acid, Venous: 3 mmol/L (ref 0.5–1.9)
Lactic Acid, Venous: 2.5 mmol/L (ref 0.5–1.9)

## 2019-01-21 LAB — GLUCOSE, CAPILLARY: Glucose-Capillary: 115 mg/dL — ABNORMAL HIGH (ref 70–99)

## 2019-01-21 MED ORDER — SODIUM CHLORIDE 0.9 % IV SOLN
2.0000 g | Freq: Once | INTRAVENOUS | Status: AC
  Administered 2019-01-21: 2 g via INTRAVENOUS
  Filled 2019-01-21: qty 2

## 2019-01-21 MED ORDER — ACETAMINOPHEN 325 MG PO TABS
650.0000 mg | ORAL_TABLET | Freq: Four times a day (QID) | ORAL | Status: DC | PRN
  Administered 2019-01-24 – 2019-02-02 (×2): 650 mg via ORAL
  Filled 2019-01-21 (×2): qty 2

## 2019-01-21 MED ORDER — SENNOSIDES-DOCUSATE SODIUM 8.6-50 MG PO TABS: 1.0000 | ORAL_TABLET | Freq: Every evening | ORAL | Status: DC | PRN

## 2019-01-21 MED ORDER — VANCOMYCIN HCL IN DEXTROSE 1-5 GM/200ML-% IV SOLN: 1000.0000 mg | Freq: Once | INTRAVENOUS | Status: DC

## 2019-01-21 MED ORDER — SODIUM CHLORIDE 0.9 % IV BOLUS (SEPSIS)
1000.0000 mL | Freq: Once | INTRAVENOUS | Status: AC
  Administered 2019-01-21: 1000 mL via INTRAVENOUS

## 2019-01-21 MED ORDER — VANCOMYCIN HCL 10 G IV SOLR
2500.0000 mg | Freq: Once | INTRAVENOUS | Status: AC
  Administered 2019-01-21: 2500 mg via INTRAVENOUS
  Filled 2019-01-21: qty 2500

## 2019-01-21 MED ORDER — SODIUM CHLORIDE 0.9 % IV BOLUS
1000.0000 mL | Freq: Once | INTRAVENOUS | Status: AC
  Administered 2019-01-21: 1000 mL via INTRAVENOUS

## 2019-01-21 MED ORDER — HEPARIN SODIUM (PORCINE) 5000 UNIT/ML IJ SOLN
5000.0000 [IU] | Freq: Three times a day (TID) | INTRAMUSCULAR | Status: DC
  Administered 2019-01-21 – 2019-02-08 (×46): 5000 [IU] via SUBCUTANEOUS
  Filled 2019-01-21 (×48): qty 1

## 2019-01-21 MED ORDER — SODIUM CHLORIDE 0.9 % IV SOLN
1.0000 g | INTRAVENOUS | Status: DC
  Filled 2019-01-21: qty 1

## 2019-01-21 MED ORDER — SODIUM CHLORIDE 0.9 % IV SOLN
INTRAVENOUS | Status: DC
  Administered 2019-01-21: 23:00:00 via INTRAVENOUS

## 2019-01-21 MED ORDER — ACETAMINOPHEN 650 MG RE SUPP: 650.0000 mg | Freq: Four times a day (QID) | RECTAL | Status: DC | PRN

## 2019-01-21 MED ORDER — INSULIN ASPART 100 UNIT/ML ~~LOC~~ SOLN: 0.0000 [IU] | Freq: Every day | SUBCUTANEOUS | Status: DC

## 2019-01-21 MED ORDER — SODIUM CHLORIDE 0.9 % IV BOLUS (SEPSIS): 1000.0000 mL | Freq: Once | INTRAVENOUS | Status: DC

## 2019-01-21 MED ORDER — VANCOMYCIN VARIABLE DOSE PER UNSTABLE RENAL FUNCTION (PHARMACIST DOSING): Status: DC

## 2019-01-21 MED ORDER — INSULIN ASPART 100 UNIT/ML ~~LOC~~ SOLN
0.0000 [IU] | Freq: Three times a day (TID) | SUBCUTANEOUS | Status: DC
  Administered 2019-01-23 (×2): 1 [IU] via SUBCUTANEOUS
  Administered 2019-01-23: 2 [IU] via SUBCUTANEOUS
  Administered 2019-01-24: 1 [IU] via SUBCUTANEOUS
  Administered 2019-01-25: 2 [IU] via SUBCUTANEOUS
  Administered 2019-01-25 – 2019-01-28 (×4): 1 [IU] via SUBCUTANEOUS

## 2019-01-21 MED ORDER — ALBUTEROL SULFATE (2.5 MG/3ML) 0.083% IN NEBU: 2.5000 mg | INHALATION_SOLUTION | RESPIRATORY_TRACT | Status: DC | PRN

## 2019-01-21 MED ORDER — SODIUM CHLORIDE 0.9% FLUSH
3.0000 mL | Freq: Two times a day (BID) | INTRAVENOUS | Status: DC
  Administered 2019-01-22 – 2019-01-28 (×6): 3 mL via INTRAVENOUS
  Administered 2019-01-29: 20 mL via INTRAVENOUS
  Administered 2019-01-29 – 2019-02-07 (×13): 3 mL via INTRAVENOUS

## 2019-01-21 NOTE — H&P (Addendum)
History and Physical    [name] SHF:[id number] DOB: February 20, 1946 DOA: 01/21/2019  PCP: Shirline Frees, MD   I have briefly reviewed patients previous medical reports in [facility].  Patient coming from: [facility]  Chief Complaint: "Extremely obtunded" and hypotensive.  HPI: [name] is a 73 year old female with PMH of MI/CAD status post PCI 5027, chronic diastolic CHF, DM 2/IDDM, HTN, HLD, recently hospitalized 12/06/2018-12/11/2018 when she presented with confusion and incontinence after starting meloxicam and baclofen for chest pain, treated in the hospital for acute metabolic encephalopathy due to E. coli UTI and baclofen, suspected mild dementia, musculoskeletal chest pain, discharged to [facility], brought back to [facility] ED by EMS with above complaints.  Patient is unable to provide any history due to her confusion and no family or friends at bedside.  History gleaned by chart review and discussing with EDP.  Patient was apparently planned to be discharged from SNF to home today but when she was evaluated this morning, she was noted to be very weak.  EMS noted blood pressure in the 80s and patient was confused.  Blood sugar was 150.  As per patient's friend, patient has been off and on confused, poor oral intake, very weak and unable to participate in PT which has been present at the SNF for almost 2 weeks.  Patient currently is only oriented to self and partly to place and keeps saying "I want to go home" and does not answer any questions appropriately.  She barely follows any instructions appropriately either.  As per SNF provider notes, seen 2/13 for vomiting, progressive weakness and poor oral intake, diarrhea and patient refusing to work with PT/OT, patient denied diarrhea ago and still had some dysuria.  ED Course: Since arrival to ED, afebrile, initially tachypneic in the low 20s, presented with BP of 85/36 which went as low as 68/48 mmHg.  Lab work significant for  sodium 129, chloride 95, bicarbonate 15, glucose 216, BUN 139, creatinine 4.89, anion gap 19, POC troponin x1-, lactate 3, WBC 12.3, blood cultures drawn, urine microscopy pending and chest x-ray showed no edema or consolidation.  Sepsis protocol was initiated and she received 2 L of IV saline bolus thus far and SBP have improved to the 100s.  Foley catheter was placed and light green, dirty looking urine with sediments noted in bag.  As per ED nursing at bedside, patient doing much better with improved mental status (on arrival apparently barely responded even to sternal rub) and improved blood pressures.  EDP discussed with CCM for admission who recommended admission to medicine service.  Review of Systems:  All other systems reviewed and apart from HPI, are negative.  Past Medical History:  Diagnosis Date  . Anxiety    Due to current back pain and has to lie flat.  . Arthritis   . CAD (coronary artery disease)    a. NSTEMI in setting of UTI and SVT 12/2012 => LHC 01/08/13: Proximal LAD 30%, ostial diagonal 30-40%, proximal RCA 95%, inferior AK, EF 45-50% => PCI: Promus DES x 2 to RCA;  b. Echo 01/05/13: EF 74-12%, grade 1 diastolic dysfunction, MAC   . Compression fracture    thoracic 12  . Edema   . History of blood transfusion   . Hypercholesteremia   . Hypertension   . Myocardial infarction ([place]) 2014  . Rectal ulceration    colonoscopy 12/2012  . SVT (supraventricular tachycardia) ([place])   . Type II diabetes mellitus ([place])  Past Surgical History:  Procedure Laterality Date  . CATARACT EXTRACTION, BILATERAL Bilateral   . COLONOSCOPY Left 01/12/2013   Procedure: COLONOSCOPY;  Surgeon: Wonda Horner, MD;  Location: [facility] ENDOSCOPY;  Service: Endoscopy;  Laterality: Left;  . COLONOSCOPY N/A 01/14/2013   Procedure: COLONOSCOPY;  Surgeon: Wonda Horner, MD;  Location: [facility] ENDOSCOPY;  Service: Endoscopy;  Laterality: N/A;  Rm 2034, attemped Colon on 2-15=inadequate prep  . CORONARY ANGIOPLASTY   12/2012  . EYE SURGERY Bilateral "many"  . FRACTURE SURGERY    . KYPHOPLASTY  12/27/2012   Procedure: KYPHOPLASTY;  Surgeon: Sinclair Ship, MD;  Location: [place];  Service: Orthopedics;  Laterality: Bilateral;  T-8 kyphoplasty  . KYPHOPLASTY N/A 07/15/2015   Procedure: T11 KYPHOPLASTY;  Surgeon: Phylliss Bob, MD;  Location: [place];  Service: Orthopedics;  Laterality: N/A;  T11 kyphoplasty  . KYPHOPLASTY N/A 09/17/2015   Procedure: T12 kyphoplasty;  Surgeon: Phylliss Bob, MD;  Location: [place];  Service: Orthopedics;  Laterality: N/A;  T12 kyphoplasty  . LEFT HEART CATHETERIZATION WITH CORONARY ANGIOGRAM N/A 01/08/2013   Procedure: LEFT HEART CATHETERIZATION WITH CORONARY ANGIOGRAM;  Surgeon: Thayer Headings, MD;  Location: [facility] CATH LAB;  Service: Cardiovascular;  Laterality: N/A;  . ORIF HIP FRACTURE Left 11/15/2014   Procedure: OPEN REDUCTION INTERNAL FIXATION HIP INTERTROCH;  Surgeon: Mcarthur Rossetti, MD;  Location: [place];  Service: Orthopedics;  Laterality: Left;  . PERCUTANEOUS STENT INTERVENTION N/A 01/15/2013   Procedure: PERCUTANEOUS STENT INTERVENTION;  Surgeon: Peter M Martinique, MD;  Location: [facility] CATH LAB;  Service: Cardiovascular;  Laterality: N/A;  . TIBIA IM NAIL INSERTION Right 05/26/2016   Procedure: INTRAMEDULLARY (IM) NAIL TIBIAL;  Surgeon: Meredith Pel, MD;  Location: [place];  Service: Orthopedics;  Laterality: Right;  . TUBAL LIGATION      Social History  reports that she has never smoked. She has never used smokeless tobacco. She reports that she does not drink alcohol or use drugs.  Allergies  Allergen Reactions  . Oxycodone Nausea Only    Family History  Problem Relation Age of Onset  . Hypertension Brother      Prior to Admission medications   Medication Sig Start Date End Date Taking? Authorizing Provider  acetaminophen (TYLENOL) 325 MG tablet Take 650 mg by mouth every 6 (six) hours as needed.    [provider]  calcium-vitamin D  (OSCAL WITH D) 500-200 MG-UNIT tablet Take 1 tablet by mouth daily.     [provider]  cholecalciferol (VITAMIN D3) 25 MCG (1000 UT) tablet Take 2,000 Units by mouth at bedtime.    [provider]  furosemide (LASIX) 40 MG tablet Take 40 mg by mouth daily.     [provider]  Insulin Glargine (LANTUS SOLOSTAR) 100 UNIT/ML Solostar Pen Inject 10 Units into the skin at bedtime.    [provider]  ipratropium-albuterol (DUONEB) 0.5-2.5 (3) MG/3ML SOLN Take 3 mLs by nebulization every 6 (six) hours as needed (SOB).    [provider]  lidocaine (LIDODERM) 5 % Place 1 patch onto the skin daily. Remove & Discard patch within 12 hours or as directed by MD . Apply over left lateral chest 12/11/18   Dhungel, Nishant, MD  metFORMIN (GLUCOPHAGE) 1000 MG tablet Take 1,000 mg by mouth 2 (two) times daily.  08/26/15   [provider]  metoprolol (LOPRESSOR) 50 MG tablet Take 1 tablet (50 mg total) by mouth 2 (two) times daily. PLEASE CONTACT OFFICE FOR ADDITIONAL  REFILLS 09/29/16   Minus Breeding, MD  Multiple Vitamin (MULTIVITAMIN WITH MINERALS) TABS tablet Take 1 tablet by mouth daily.     [provider]  naproxen sodium (ALEVE) 220 MG tablet Take 220 mg by mouth 2 (two) times daily as needed (pain).    [provider]  nitroGLYCERIN (NITROSTAT) 0.4 MG SL tablet Place 1 tablet (0.4 mg total) under the tongue every 5 (five) minutes as needed for chest pain. 04/16/13   Richardson Dopp T, PA-C  ondansetron (ZOFRAN ODT) 4 MG disintegrating tablet Take 1 tablet (4 mg total) by mouth every 8 (eight) hours as needed for nausea or vomiting. 06/28/15   Jule Ser, DO  pioglitazone (ACTOS) 30 MG tablet Take 30 mg by mouth at bedtime.  09/08/15   [provider]  ramipril (ALTACE) 10 MG capsule Take 10 mg by mouth at bedtime.  08/26/15   [provider]  sertraline (ZOLOFT) 50 MG tablet Take 50 mg by mouth daily.    [provider]  simvastatin (ZOCOR) 40 MG tablet Take 40 mg by mouth at bedtime.     [provider]  vitamin B-12 (CYANOCOBALAMIN) 1000 MCG tablet Take 1,000 mcg by mouth daily.    [provider]    Physical Exam: Vitals:   01/21/19 1510 01/21/19 1515 01/21/19 1520 01/21/19 1526  BP: (!) 69/38 (!) 86/42 (!) 93/52 91/72  Pulse:   78   Resp: 18 (!) 25 12 (!) 21  Temp:      TempSrc:      SpO2:   100%   Weight:      Height:        Patient was examined with 2 female ED RN's in room to assist.  Constitutional: Elderly female, moderately built and obese, chronically ill looking lying comfortably supine in bed. Eyes: PERTLA, lids and conjunctivae normal.  Bilateral IOLs. ENMT: Oral mucosa extremely dry, tongue coated but no other acute findings.  No oral thrush. Neck: supple, no masses, no thyromegaly.  No carotid bruits. Respiratory: Clear to auscultation.  No wheezing, rhonchi or crackles heard.  No increased work of breathing. Cardiovascular: S1 and S2 heard, RRR.  No JVD, murmurs or pedal edema.  Telemetry personally reviewed: Sinus rhythm, intermittent sinus tachycardia, mild. Abdomen: Nondistended, soft and nontender.  No organomegaly or masses appreciated.  Normal bowel sounds heard. Musculoskeletal: no clubbing / cyanosis. No joint deformity upper and lower extremities. Good ROM, no contractures. Normal muscle tone.  Skin: Patient has faint patchy redness in her groin and anterior gluteal cleft area, suspicious for superficial fungal infection Neurologic: Currently alert and oriented to self and partly to place "[facility]" while she is in [facility] ED.  No focal neurological deficits. Extremities: Symmetric 5 x 5 power in upper extremities and at least grade 2 x 5 power in lower extremities. Psychiatric: Impaired judgment and insight. Alert and oriented x 3.  Confused.  Not agitated.    Labs on Admission: I have personally reviewed following  labs and imaging studies  CBC: Recent Labs  Lab 01/21/19 1309  WBC 12.3*  NEUTROABS 10.0*  HGB 13.8  HCT 42.3  MCV 91.4  PLT 785   Basic Metabolic Panel: Recent Labs  Lab 01/21/19 1309  NA 129*  K 4.9  CL 95*  CO2 15*  GLUCOSE 216*  BUN 139*  CREATININE 4.89*  CALCIUM 8.7*   Liver Function Tests: Recent Labs  Lab 01/21/19 1309  AST 23  ALT  33  ALKPHOS 59  BILITOT 0.9  PROT 6.7  ALBUMIN 2.2*     Radiological Exams on Admission: Dg Chest Port 1 View  Result Date: 01/21/2019 CLINICAL DATA:  Altered mental status/nonresponsive EXAM: PORTABLE CHEST 1 VIEW COMPARISON:  Chest radiograph December 06, 2018 and chest CT December 07, 2018 FINDINGS: There is no appreciable edema or consolidation. Heart size and pulmonary vascularity are normal. No adenopathy. Patient is undergone previous mid lower thoracic kyphoplasty procedures. There is aortic atherosclerosis. Bones are osteoporotic. IMPRESSION: No edema or consolidation.  Stable cardiac silhouette. Aortic Atherosclerosis (ICD10-I70.0). Electronically Signed   By: Lowella Grip III M.D.   On: 01/21/2019 13:36     Assessment/Plan Principal Problem:   Hypovolemic shock (HCC) Active Problems:   Dehydration   HTN (hypertension)   Uncontrolled type 2 diabetes mellitus with hyperglycemia (HCC)   Acute metabolic encephalopathy   Physical deconditioning     1. Hypovolemic shock: Secondary to profound dehydration related to ongoing poor oral intake, diuretics at SNF and rule out infectious etiology.  Thus far has received 2 L IV saline bolus in ED with improvement in BP.  Continue aggressive IV fluid hydration.  Obviously hold diuretics at least temporarily. 2. Severe dehydration with hyponatremia: Management as above. 3. Hypotension: Secondary to dehydration and medications (lisinopril and Lasix).  Aggressive IV fluids.  Improving. 4. Acute renal failure: Multifactorial due to dehydration, Lasix, Altace and NSAIDs.  Hold  culprit medications.  Foley catheter placed in ED for strict intake output monitoring and initial unstable BPs.  Continue IV fluids and follow BMP closely.  Avoid nephrotoxic's.  Does not seem oliguric.  Recent normal creatinine 01/01/2019. 5. Anion gap metabolic acidosis: Bicarbonate 15 and anion gap 19.  Likely related to lactic acidosis and from acute renal failure.  Continue IV hydration, hold metformin and follow BMP closely.  Less likely to be DKA, follow urinary ketones and blood sugar only 216. 6. Suspected sepsis: Met sepsis criteria on admission.  IV fluids per sepsis protocol and started empirically on IV cefepime and vancomycin.  May have another UTI based on gross examination.  Follow blood and urine culture results.  Chest x-ray negative for pneumonia. 7. Type II DM/IDDM: Hold metformin, Actos and Lantus for now.  SSI alone.  Adjust insulins as needed. 8. Essential hypertension: Presented with hypotension. 9. Acute metabolic encephalopathy: Due to acute renal failure and possible sepsis complicating underlying dementia.  No focal deficits on exam.  Treat underlying cause and monitor closely.  Hold Zoloft.  Avoid sedating medications i.e. benzodiazepines and opioids. 10. Physical deconditioning: PT and OT evaluation probably may have to return to SNF. 11. CAD status post PCI: No report of chest pain.  POC troponin negative. 12. Chronic diastolic CHF: Currently severely dehydrated.  Holding Lasix. 13. Hyperlipidemia 14. AAA measuring 4.2 cm: Incidental finding during recent hospitalization.  Outpatient follow-up. 15. Large multinodular goiter: Thyroid nodule with multiple small nodules noted during recent admission.  TSH normal.  Outpatient follow-up. 16. ?  Diarrhea: Noted in SNF provider note from 2/13.  Not sure if this is an ongoing issue.  If has further diarrhea then may need testing. 17. Groin fungal rash: Topical nystatin.   DVT prophylaxis: Subcutaneous heparin Code Status: DNR-  patient came with a completed DNR form from SNF. Family Communication: I called and discussed in detail with patient's friend listed in her chart.  She indicates that patient has been doing very poorly at the nursing facility for a couple of weeks.  She confirmed DNR which the patient had told her as well.  Patient has no living family, lives at home by herself, her spouse died several years ago.  The second person listed on the chart is her pastor who saw her a week ago at the SNF and was also concerned regarding her poor status. Disposition Plan: To be determined but likely back to SNF pending clinical improvement Consults called: None Admission status: Inpatient, progressive unit.   Vernell Leep MD [facility]  To contact the attending provider between 7A-7P or the covering provider during after hours 7P-7A, please log into the web site [email or web address] and access using universal [place] password for that web site. If you do not have the password, please call the hospital operator.  01/21/2019, 4:32 PM

## 2019-01-21 NOTE — ED Notes (Signed)
Warm blankets given to patient.

## 2019-01-21 NOTE — ED Notes (Addendum)
Text page sent to Dr. Algis Liming regarding critical lactic 2.5 at 1819.

## 2019-01-21 NOTE — Progress Notes (Signed)
Arrived to ED: 2 sites established already. Cancel consult.

## 2019-01-21 NOTE — ED Triage Notes (Signed)
Pt arrive by [facility] from [place] due to pt being "extremely obtunded" and hypotensive. Pt was going to be d/c to home with a UTI from [place], but was unresponsive to a sternal rub. BP was 80/40 with weak pulses. Pt is a DNR.    EMS vitals: CBG 158 97/62 HR 65 SR On nonrebreather, EMS unable to get O2 sat

## 2019-01-21 NOTE — ED Notes (Signed)
ED Provider at bedside. 

## 2019-01-21 NOTE — ED Provider Notes (Addendum)
[place] EMERGENCY DEPARTMENT Provider Note   CSN: [id number] Arrival date & time: 01/21/19  1253    History   Chief Complaint Chief Complaint  Patient presents with  . Hypotension    HPI [name] is a 73 y.o. female.     Patient presents from [facility] via EMS with generalized weakness, decreased responsiveness. Per EMS, ECF was planning to d/c patient to home today, but when checked on her this AM was very weak. EMS notes bp was low, in 80's, and patient confused. They note EMS stated blood sugar 150. Patient is very poorly responsive, occasionally responds yes/no to questions - level 5 caveat. Denies pain or fever. No report of trauma or fall.  Patients friend notes that her mental status has been off/confused, poor po intake,  and very weakness/inability to participate in PT has been present at [facility] for almost two weeks.  The history is provided by the patient, a friend and the EMS personnel. The history is limited by the condition of the patient.    Past Medical History:  Diagnosis Date  . Anxiety    Due to current back pain and has to lie flat.  . Arthritis   . CAD (coronary artery disease)    a. NSTEMI in setting of UTI and SVT 12/2012 => LHC 01/08/13: Proximal LAD 30%, ostial diagonal 30-40%, proximal RCA 95%, inferior AK, EF 45-50% => PCI: Promus DES x 2 to RCA;  b. Echo 01/05/13: EF 30-86%, grade 1 diastolic dysfunction, MAC   . Compression fracture    thoracic 12  . Edema   . History of blood transfusion   . Hypercholesteremia   . Hypertension   . Myocardial infarction ([place]) 2014  . Rectal ulceration    colonoscopy 12/2012  . SVT (supraventricular tachycardia) ([place])   . Type II diabetes mellitus [facility])     Patient Active Problem List   Diagnosis Date Noted  . Aortic aneurysm ([place]) 12/13/2018  . Neurocognitive deficits 12/13/2018  . Acute metabolic encephalopathy 57/84/6962  . Physical deconditioning 12/11/2018  . Acute delirium 12/11/2018    . Rib pain on left side 12/11/2018  . E. coli UTI (urinary tract infection) 12/11/2018  . Uncontrolled type 2 diabetes mellitus with hyperglycemia ([place]) 12/06/2018  . Adjustment disorder with anxious mood 05/31/2016  . Tibia fracture after fall-s/p intramedulaary nailing 05/26/16 05/26/2016  . 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 Epiretinal membrane (ERM) of both eyes 01/26/2016  . Orthostatic hypotension   . Syncope and collapse 07/18/2015  . Compression fracture 07/15/2015  . Sepsis ([place]) 12/05/2014  . Constipation 12/04/2014  . Hematuria   . Intertrochanteric fracture of left hip ([place]) 11/14/2014  . 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 Contusion of right leg 11/13/2013  . HTN (hypertension) 11/12/2013  . Fall 11/12/2013  . CAD -S/P PCI Feb 2014 01/11/2013  . Oral thrush 01/11/2013  . Hematochezia 01/08/2013  . PSVT-post op 05/26/16 01/05/2013  . History of NSTEMI Feb 2014 01/05/2013  . Hypokalemia 01/04/2013  . Fever 01/04/2013  . H/O vertebroplasty 01/04/2013  . Proliferative diabetic retinopathy ([place]) 08/15/2012  . Pseudoaphakia 08/15/2012  . Cellulitis and abscess of foot 03/31/2012  . Dehydration 03/31/2012  . Hyponatremia 03/31/2012    Past Surgical History:  Procedure Laterality Date  . CATARACT EXTRACTION, BILATERAL Bilateral   . COLONOSCOPY Left 01/12/2013   Procedure: COLONOSCOPY;  Surgeon: Wonda Horner, MD;  Location: [facility] ENDOSCOPY;  Service: Endoscopy;  Laterality: Left;  . COLONOSCOPY N/A 01/14/2013  Procedure: COLONOSCOPY;  Surgeon: Wonda Horner, MD;  Location: [facility] ENDOSCOPY;  Service: Endoscopy;  Laterality: N/A;  Rm 2034, attemped Colon on 2-15=inadequate prep  . CORONARY ANGIOPLASTY  12/2012  . EYE SURGERY Bilateral "many"  . FRACTURE SURGERY    . KYPHOPLASTY  12/27/2012   Procedure: KYPHOPLASTY;  Surgeon: Sinclair Ship, MD;  Location: [place];  Service: Orthopedics;  Laterality: Bilateral;  T-8 kyphoplasty  . KYPHOPLASTY N/A 07/15/2015   Procedure: T11  KYPHOPLASTY;  Surgeon: Phylliss Bob, MD;  Location: [place];  Service: Orthopedics;  Laterality: N/A;  T11 kyphoplasty  . KYPHOPLASTY N/A 09/17/2015   Procedure: T12 kyphoplasty;  Surgeon: Phylliss Bob, MD;  Location: [place];  Service: Orthopedics;  Laterality: N/A;  T12 kyphoplasty  . LEFT HEART CATHETERIZATION WITH CORONARY ANGIOGRAM N/A 01/08/2013   Procedure: LEFT HEART CATHETERIZATION WITH CORONARY ANGIOGRAM;  Surgeon: Thayer Headings, MD;  Location: [facility] CATH LAB;  Service: Cardiovascular;  Laterality: N/A;  . ORIF HIP FRACTURE Left 11/15/2014   Procedure: OPEN REDUCTION INTERNAL FIXATION HIP INTERTROCH;  Surgeon: Mcarthur Rossetti, MD;  Location: [place];  Service: Orthopedics;  Laterality: Left;  . PERCUTANEOUS STENT INTERVENTION N/A 01/15/2013   Procedure: PERCUTANEOUS STENT INTERVENTION;  Surgeon: Peter M Martinique, MD;  Location: [facility] CATH LAB;  Service: Cardiovascular;  Laterality: N/A;  . TIBIA IM NAIL INSERTION Right 05/26/2016   Procedure: INTRAMEDULLARY (IM) NAIL TIBIAL;  Surgeon: Meredith Pel, MD;  Location: [place];  Service: Orthopedics;  Laterality: Right;  . TUBAL LIGATION       OB History   No obstetric history on file.      Home Medications    Prior to Admission medications   Medication Sig Start Date End Date Taking? Authorizing Provider  acetaminophen (TYLENOL) 325 MG tablet Take 650 mg by mouth every 6 (six) hours as needed.    [provider]  calcium-vitamin D (OSCAL WITH D) 500-200 MG-UNIT tablet Take 1 tablet by mouth daily.     [provider]  cholecalciferol (VITAMIN D3) 25 MCG (1000 UT) tablet Take 2,000 Units by mouth at bedtime.    [provider]  furosemide (LASIX) 40 MG tablet Take 40 mg by mouth daily.     [provider]  Insulin Glargine (LANTUS SOLOSTAR) 100 UNIT/ML Solostar Pen Inject 10 Units into the skin at bedtime.    [provider]  ipratropium-albuterol (DUONEB) 0.5-2.5 (3) MG/3ML SOLN Take 3 mLs  by nebulization every 6 (six) hours as needed (SOB).    [provider]  lidocaine (LIDODERM) 5 % Place 1 patch onto the skin daily. Remove & Discard patch within 12 hours or as directed by MD . Apply over left lateral chest 12/11/18   Dhungel, Nishant, MD  metFORMIN (GLUCOPHAGE) 1000 MG tablet Take 1,000 mg by mouth 2 (two) times daily.  08/26/15   [provider]  metoprolol (LOPRESSOR) 50 MG tablet Take 1 tablet (50 mg total) by mouth 2 (two) times daily. PLEASE CONTACT OFFICE FOR ADDITIONAL REFILLS 09/29/16   Minus Breeding, MD  Multiple Vitamin (MULTIVITAMIN WITH MINERALS) TABS tablet Take 1 tablet by mouth daily.     [provider]  naproxen sodium (ALEVE) 220 MG tablet Take 220 mg by mouth 2 (two) times daily as needed (pain).    [provider]  nitroGLYCERIN (NITROSTAT) 0.4 MG SL tablet Place 1 tablet (0.4 mg total) under the tongue every 5 (five) minutes as needed for chest pain. 04/16/13   Kathlen Mody,  Scott T, PA-C  ondansetron (ZOFRAN ODT) 4 MG disintegrating tablet Take 1 tablet (4 mg total) by mouth every 8 (eight) hours as needed for nausea or vomiting. 06/28/15   Jule Ser, DO  pioglitazone (ACTOS) 30 MG tablet Take 30 mg by mouth at bedtime.  09/08/15   [provider]  ramipril (ALTACE) 10 MG capsule Take 10 mg by mouth at bedtime.  08/26/15   [provider]  sertraline (ZOLOFT) 50 MG tablet Take 50 mg by mouth daily.    [provider]  simvastatin (ZOCOR) 40 MG tablet Take 40 mg by mouth at bedtime.     [provider]  vitamin B-12 (CYANOCOBALAMIN) 1000 MCG tablet Take 1,000 mcg by mouth daily.    [provider]    Family History Family History  Problem Relation Age of Onset  . Hypertension Brother     Social History Social History   Tobacco Use  . Smoking status: Never Smoker  . Smokeless tobacco: Never Used  Substance Use Topics  . Alcohol use: No  . Drug use: No     Allergies     Oxycodone   Review of Systems Review of Systems  Unable to perform ROS: Mental status change  Constitutional: Negative for fever.  Neurological: Negative for headaches.  level 5 caveat, confused, altered ms, poorly responsive     Physical Exam Updated Vital Signs BP (!) 49/37   Pulse 77   Temp (!) 97.2 F (36.2 C) (Rectal)   Resp 14   Ht 1.74 m (5' 8.5")   Wt 95.3 kg   SpO2 100%   BMI 31.47 kg/m   Physical Exam Vitals signs and nursing note reviewed.  Constitutional:      Appearance: Normal appearance. She is well-developed.     Comments: Very weak appearing. Slow to respond. Hypotensive.   HENT:     Head: Atraumatic.     Nose: Nose normal.     Mouth/Throat:     Mouth: Mucous membranes are moist.  Eyes:     General: No scleral icterus.    Conjunctiva/sclera: Conjunctivae normal.     Pupils: Pupils are equal, round, and reactive to light.  Neck:     Musculoskeletal: Normal range of motion and neck supple. No neck rigidity or muscular tenderness.     Vascular: No carotid bruit.     Trachea: No tracheal deviation.     Comments: No stiffness or rigidity.  Cardiovascular:     Rate and Rhythm: Normal rate and regular rhythm.     Pulses: Normal pulses.     Heart sounds: Normal heart sounds. No murmur. No friction rub. No gallop.   Pulmonary:     Effort: Pulmonary effort is normal. No respiratory distress.     Breath sounds: Normal breath sounds.  Abdominal:     General: Abdomen is flat. Bowel sounds are normal. There is no distension.     Palpations: Abdomen is soft.     Tenderness: There is no abdominal tenderness. There is no guarding.  Genitourinary:    Comments: No cva tenderness.  Musculoskeletal:        General: No swelling.  Skin:    General: Skin is warm and dry.     Coloration: Skin is pale.     Findings: No rash.  Neurological:     Comments: Pt lethargic, slow to respond. Answers some questions yes/no. Moves ext purposefully.    Psychiatric:      Comments: Altered, slow  to respond.       ED Treatments / Results  Labs (all labs ordered are listed, but only abnormal results are displayed) Results for orders placed or performed during the hospital encounter of 01/21/19  Lactic acid, plasma  Result Value Ref Range   Lactic Acid, Venous 3.0 (HH) 0.5 - 1.9 mmol/L  Comprehensive metabolic panel  Result Value Ref Range   Sodium 129 (L) 135 - 145 mmol/L   Potassium 4.9 3.5 - 5.1 mmol/L   Chloride 95 (L) 98 - 111 mmol/L   CO2 15 (L) 22 - 32 mmol/L   Glucose, Bld 216 (H) 70 - 99 mg/dL   BUN 139 (H) 8 - 23 mg/dL   Creatinine, Ser 4.89 (H) 0.44 - 1.00 mg/dL   Calcium 8.7 (L) 8.9 - 10.3 mg/dL   Total Protein 6.7 6.5 - 8.1 g/dL   Albumin 2.2 (L) 3.5 - 5.0 g/dL   AST 23 15 - 41 U/L   ALT 33 0 - 44 U/L   Alkaline Phosphatase 59 38 - 126 U/L   Total Bilirubin 0.9 0.3 - 1.2 mg/dL   GFR calc non Af Amer 8 (L) >60 mL/min   GFR calc Af Amer 9 (L) >60 mL/min   Anion gap 19 (H) 5 - 15  CBC WITH DIFFERENTIAL  Result Value Ref Range   WBC 12.3 (H) 4.0 - 10.5 K/uL   RBC 4.63 3.87 - 5.11 MIL/uL   Hemoglobin 13.8 12.0 - 15.0 g/dL   HCT 42.3 36.0 - 46.0 %   MCV 91.4 80.0 - 100.0 fL   MCH 29.8 26.0 - 34.0 pg   MCHC 32.6 30.0 - 36.0 g/dL   RDW 13.6 11.5 - 15.5 %   Platelets 180 150 - 400 K/uL   nRBC 0.0 0.0 - 0.2 %   Neutrophils Relative % 80 %   Neutro Abs 10.0 (H) 1.7 - 7.7 K/uL   Lymphocytes Relative 9 %   Lymphs Abs 1.1 0.7 - 4.0 K/uL   Monocytes Relative 9 %   Monocytes Absolute 1.1 (H) 0.1 - 1.0 K/uL   Eosinophils Relative 1 %   Eosinophils Absolute 0.1 0.0 - 0.5 K/uL   Basophils Relative 0 %   Basophils Absolute 0.0 0.0 - 0.1 K/uL   Immature Granulocytes 1 %   Abs Immature Granulocytes 0.07 0.00 - 0.07 K/uL  I-stat troponin, ED  Result Value Ref Range   Troponin i, poc 0.03 0.00 - 0.08 ng/mL   Comment 3           Dg Chest Port 1 View  Result Date: 01/21/2019 CLINICAL DATA:  Altered mental status/nonresponsive EXAM:  PORTABLE CHEST 1 VIEW COMPARISON:  Chest radiograph December 06, 2018 and chest CT December 07, 2018 FINDINGS: There is no appreciable edema or consolidation. Heart size and pulmonary vascularity are normal. No adenopathy. Patient is undergone previous mid lower thoracic kyphoplasty procedures. There is aortic atherosclerosis. Bones are osteoporotic. IMPRESSION: No edema or consolidation.  Stable cardiac silhouette. Aortic Atherosclerosis (ICD10-I70.0). Electronically Signed   By: Lowella Grip III M.D.   On: 01/21/2019 13:36    ED ECG REPORT   Date: 01/21/2019  Rate: 85  Rhythm: normal sinus rhythm  QRS Axis: normal  Intervals: normal  ST/T Wave abnormalities: nonspecific ST/T changes  Conduction Disutrbances:none  Narrative Interpretation:   Old EKG Reviewed: unchanged  I have personally reviewed the EKG tracing    Radiology Dg Chest Port 1 View  Result Date: 01/21/2019 CLINICAL DATA:  Altered mental status/nonresponsive EXAM: PORTABLE CHEST 1 VIEW COMPARISON:  Chest radiograph December 06, 2018 and chest CT December 07, 2018 FINDINGS: There is no appreciable edema or consolidation. Heart size and pulmonary vascularity are normal. No adenopathy. Patient is undergone previous mid lower thoracic kyphoplasty procedures. There is aortic atherosclerosis. Bones are osteoporotic. IMPRESSION: No edema or consolidation.  Stable cardiac silhouette. Aortic Atherosclerosis (ICD10-I70.0). Electronically Signed   By: Lowella Grip III M.D.   On: 01/21/2019 13:36    Procedures Procedures (including critical care time)  Medications Ordered in ED Medications  ceFEPIme (MAXIPIME) 2 g in sodium chloride 0.9 % 100 mL IVPB (2 g Intravenous New Bag/Given 01/21/19 1408)  vancomycin (VANCOCIN) 2,500 mg in sodium chloride 0.9 % 500 mL IVPB (has no administration in time range)  sodium chloride 0.9 % bolus 1,000 mL (1,000 mLs Intravenous New Bag/Given 01/21/19 1346)  sodium chloride 0.9 % bolus 1,000 mL  (1,000 mLs Intravenous New Bag/Given 01/21/19 1423)     Initial Impression / Assessment and Plan / ED Course  I have reviewed the triage vital signs and the nursing notes.  Pertinent labs & imaging results that were available during my care of the patient were reviewed by me and considered in my medical decision making (see chart for details).  Iv ns bolus. Continuous pulse ox and monitor. o2 [place]. Labs. Imaging.  Reviewed nursing notes and prior charts for additional history.   As hypotensive, code sepsis activated for possible sepsis/infection. Additional ns iv. 30 cc/kg.   Labs reviewed - AKI, bun severely elevated, likely contributing to mental status. Bladder scan. Foley. Ivf.   cxr reviewed - no pna.   Frequent reassessments/checks of pt.   Pt sl more responsive on recheck.   Critical care consulted re hypotension. Discussed pt, hypotension, bun 139/AKI with Dr Nelda Marseille - he indicates given DNR status, modest elevated lactate, to continue fluid resuscitation, admit to medicine service.   Recheck bp is 84/50. Still receiving iv ns bolus.   Hospitalists consulted for admission.  CRITICAL CARE RE: hypotension, severe dehydration, AKI, altered mental status.  Performed by: Mirna Mires Total critical care time: 95 minutes Critical care time was exclusive of separately billable procedures and treating other patients. Critical care was necessary to treat or prevent imminent or life-threatening deterioration. Critical care was time spent personally by me on the following activities: development of treatment plan with patient and/or surrogate as well as nursing, discussions with consultants, evaluation of patient's response to treatment, examination of patient, obtaining history from patient or surrogate, ordering and performing treatments and interventions, ordering and review of laboratory studies, ordering and review of radiographic studies, pulse oximetry and re-evaluation of  patient's condition.  Signed out to Dr Darl Householder that hospitalist call is pending.   bp is improved post boluses, currently 94/70.   Final Clinical Impressions(s) / ED Diagnoses   Final diagnoses:  None    ED Discharge Orders    None           Lajean Saver, MD 01/21/19 [phone]

## 2019-01-21 NOTE — ED Notes (Signed)
Hospitalist bedside 

## 2019-01-21 NOTE — ED Notes (Signed)
Manual BP 78/44

## 2019-01-21 NOTE — ED Notes (Signed)
RN informed ok for Pt to have visitor

## 2019-01-21 NOTE — Progress Notes (Signed)
Pharmacy Antibiotic Note  [name] is a 73 y.o. female admitted on 01/21/2019 with sepsis.  Pharmacy has been consulted for vancomycin/cefepime dosing. Afebrile, WBC 12.3, SCr pending.  Plan: Cefepime 2g IV x 1 Vancomycin 2500mg  IV x1 Monitor clinical progress, c/s, renal function F/u de-escalation plan/LOT, vancomycin trough as indicated      Temp (24hrs), Avg:97.2 F (36.2 C), Min:97.2 F (36.2 C), Max:97.2 F (36.2 C)  No results for input(s): WBC, CREATININE, LATICACIDVEN, VANCOTROUGH, VANCOPEAK, VANCORANDOM, GENTTROUGH, GENTPEAK, GENTRANDOM, TOBRATROUGH, TOBRAPEAK, TOBRARND, AMIKACINPEAK, AMIKACINTROU, AMIKACIN in the last 168 hours.  Estimated Creatinine Clearance: 49.1 mL/min (A) (by C-G formula based on SCr of 1.3 mg/dL (A)).    Allergies  Allergen Reactions  . Oxycodone Nausea Only    Antimicrobials this admission: 2/24 vancomycin >>  2/24 cefepime >>   Dose adjustments this admission:   Microbiology results:   Elicia Lamp, PharmD, BCPS Clinical Pharmacist 01/21/2019 2:09 PM

## 2019-01-21 NOTE — Progress Notes (Signed)
Pharmacy Antibiotic Note  [name] is a 73 y.o. female admitted on 01/21/2019 with sepsis.  Pharmacy has been consulted for vancomycin and cefepime dosing.  Already given 1 time doses. Has severe AKI this admit with SCr up to 4.89.  Plan: Start cefepime 1g IV Q24h tomorrow Consider 48 hr random level on 2/26 in the afternoon if needed Monitor clinical picture, renal function, vanc levels prn F/U C&S, abx deescalation / LOT  Height: 5' 8.5" (174 cm) Weight: 210 lb (95.3 kg) IBW/kg (Calculated) : 65.05  Temp (24hrs), Avg:97.2 F (36.2 C), Min:97.2 F (36.2 C), Max:97.2 F (36.2 C)  Recent Labs  Lab 01/21/19 1309 01/21/19 1335  WBC 12.3*  --   CREATININE 4.89*  --   LATICACIDVEN  --  3.0*    Estimated Creatinine Clearance: 12.3 mL/min (A) (by C-G formula based on SCr of 4.89 mg/dL (H)).    Allergies  Allergen Reactions  . Oxycodone Nausea Only   Thank you for allowing pharmacy to be a part of this patient's care.  Reginia Naas 01/21/2019 2:38 PM

## 2019-01-22 ENCOUNTER — Other Ambulatory Visit: Payer: Self-pay

## 2019-01-22 DIAGNOSIS — N171 Acute kidney failure with acute cortical necrosis: Secondary | ICD-10-CM

## 2019-01-22 DIAGNOSIS — R6520 Severe sepsis without septic shock: Secondary | ICD-10-CM

## 2019-01-22 LAB — BLOOD CULTURE ID PANEL (REFLEXED)
Acinetobacter baumannii: NOT DETECTED
CANDIDA TROPICALIS: NOT DETECTED
Candida albicans: NOT DETECTED
Candida glabrata: NOT DETECTED
Candida krusei: NOT DETECTED
Candida parapsilosis: NOT DETECTED
Enterobacter cloacae complex: NOT DETECTED
Enterobacteriaceae species: NOT DETECTED
Enterococcus species: NOT DETECTED
Escherichia coli: NOT DETECTED
Haemophilus influenzae: NOT DETECTED
KLEBSIELLA PNEUMONIAE: NOT DETECTED
Klebsiella oxytoca: NOT DETECTED
Listeria monocytogenes: NOT DETECTED
Methicillin resistance: DETECTED — AB
Neisseria meningitidis: NOT DETECTED
Proteus species: NOT DETECTED
Pseudomonas aeruginosa: NOT DETECTED
Serratia marcescens: NOT DETECTED
Staphylococcus aureus (BCID): NOT DETECTED
Staphylococcus species: DETECTED — AB
Streptococcus agalactiae: NOT DETECTED
Streptococcus pneumoniae: NOT DETECTED
Streptococcus pyogenes: NOT DETECTED
Streptococcus species: NOT DETECTED

## 2019-01-22 LAB — CBC
HCT: 40 % (ref 36.0–46.0)
HEMOGLOBIN: 12.9 g/dL (ref 12.0–15.0)
MCH: 30.1 pg (ref 26.0–34.0)
MCHC: 32.3 g/dL (ref 30.0–36.0)
MCV: 93.2 fL (ref 80.0–100.0)
Platelets: 149 10*3/uL — ABNORMAL LOW (ref 150–400)
RBC: 4.29 MIL/uL (ref 3.87–5.11)
RDW: 14 % (ref 11.5–15.5)
WBC: 9.8 10*3/uL (ref 4.0–10.5)
nRBC: 0 % (ref 0.0–0.2)

## 2019-01-22 LAB — MRSA PCR SCREENING: MRSA by PCR: NEGATIVE

## 2019-01-22 LAB — BASIC METABOLIC PANEL
Anion gap: 17 — ABNORMAL HIGH (ref 5–15)
BUN: 122 mg/dL — ABNORMAL HIGH (ref 8–23)
CO2: 14 mmol/L — ABNORMAL LOW (ref 22–32)
Calcium: 7.6 mg/dL — ABNORMAL LOW (ref 8.9–10.3)
Chloride: 107 mmol/L (ref 98–111)
Creatinine, Ser: 3.55 mg/dL — ABNORMAL HIGH (ref 0.44–1.00)
GFR calc Af Amer: 14 mL/min — ABNORMAL LOW (ref >60)
GFR, EST NON AFRICAN AMERICAN: 12 mL/min — AB (ref >60)
GLUCOSE: 80 mg/dL (ref 70–99)
Potassium: 4.2 mmol/L (ref 3.5–5.1)
Sodium: 138 mmol/L (ref 135–145)

## 2019-01-22 LAB — GLUCOSE, CAPILLARY
GLUCOSE-CAPILLARY: 83 mg/dL (ref 70–99)
GLUCOSE-CAPILLARY: 77 mg/dL (ref 70–99)
Glucose-Capillary: 68 mg/dL — ABNORMAL LOW (ref 70–99)
Glucose-Capillary: 95 mg/dL (ref 70–99)
Glucose-Capillary: 168 mg/dL — ABNORMAL HIGH (ref 70–99)

## 2019-01-22 MED ORDER — SODIUM BICARBONATE 8.4 % IV SOLN
INTRAVENOUS | Status: DC
  Administered 2019-01-22 – 2019-01-24 (×4): via INTRAVENOUS
  Filled 2019-01-22 (×7): qty 1000

## 2019-01-22 MED ORDER — ONDANSETRON HCL 4 MG/2ML IJ SOLN
4.0000 mg | Freq: Four times a day (QID) | INTRAMUSCULAR | Status: DC | PRN
  Administered 2019-01-22 – 2019-01-25 (×5): 4 mg via INTRAVENOUS
  Filled 2019-01-22 (×5): qty 2

## 2019-01-22 NOTE — Progress Notes (Signed)
OT Cancellation Note  Patient Details Name: [name] MRN: [id number] DOB: 1946-07-19  Per discussion with PT following evaluation and previous notes from RN- pt with fluctuating HR as high as 150 bpm at rest. OT will hold until pt is medically stable.   Cancelled Treatment:    Reason Eval/Treat Not Completed: Medical issues which prohibited therapy  Curtis Sites OTR/L  01/22/2019, 11:53 AM

## 2019-01-22 NOTE — Progress Notes (Signed)
PHARMACY - PHYSICIAN COMMUNICATION CRITICAL VALUE ALERT - BLOOD CULTURE IDENTIFICATION (BCID)  [name] is an 73 y.o. female who presented to [facility] on 01/21/2019 with a chief complaint of AMS  Assessment:   73 yo F presents "extremely obtunded" and hypotensive. Found to be septic. Thought to be hypovolemic shock but still need to rule out sepsis. Blood cx is 2/4 with staph species but blood cxs actually drawn from the same site. More than likely would be considered a contaminant since blood cx's drawn from same site but she shows signs of sepsis today. She does have a hx of stent placement, no other prosthetic material known. Of note, her Na was low on admit and seems to be improving with fluids.  Name of physician (or Provider) Contacted: S. Ghimire  Current antibiotics: Vancomycin  Changes to prescribed antibiotics recommended:  Cefepime stopped earlier today Continue vancomycin per MD  May be able to monitor off vancomycin if continues to clinically improve and no real source of infection found   Results for orders placed or performed during the hospital encounter of 01/21/19  Blood Culture ID Panel (Reflexed) (Collected: 01/21/2019  1:35 PM)  Result Value Ref Range   Enterococcus species NOT DETECTED NOT DETECTED   Listeria monocytogenes NOT DETECTED NOT DETECTED   Staphylococcus species DETECTED (A) NOT DETECTED   Staphylococcus aureus (BCID) NOT DETECTED NOT DETECTED   Methicillin resistance DETECTED (A) NOT DETECTED   Streptococcus species NOT DETECTED NOT DETECTED   Streptococcus agalactiae NOT DETECTED NOT DETECTED   Streptococcus pneumoniae NOT DETECTED NOT DETECTED   Streptococcus pyogenes NOT DETECTED NOT DETECTED   Acinetobacter baumannii NOT DETECTED NOT DETECTED   Enterobacteriaceae species NOT DETECTED NOT DETECTED   Enterobacter cloacae complex NOT DETECTED NOT DETECTED   Escherichia coli NOT DETECTED NOT DETECTED   Klebsiella oxytoca NOT DETECTED NOT  DETECTED   Klebsiella pneumoniae NOT DETECTED NOT DETECTED   Proteus species NOT DETECTED NOT DETECTED   Serratia marcescens NOT DETECTED NOT DETECTED   Haemophilus influenzae NOT DETECTED NOT DETECTED   Neisseria meningitidis NOT DETECTED NOT DETECTED   Pseudomonas aeruginosa NOT DETECTED NOT DETECTED   Candida albicans NOT DETECTED NOT DETECTED   Candida glabrata NOT DETECTED NOT DETECTED   Candida krusei NOT DETECTED NOT DETECTED   Candida parapsilosis NOT DETECTED NOT DETECTED   Candida tropicalis NOT DETECTED NOT DETECTED   [name] J 01/22/2019  11:01 AM

## 2019-01-22 NOTE — Progress Notes (Signed)
Pharmacy Antibiotic Note  [name] is a 73 y.o. female admitted on 01/21/2019 with sepsis.  Blood cultures came back with 2/4 bottles with GPC. BCID is pending. Resume vanc for now until BCID is back. She got vanc 2.5g IV x1 yesterday so this will last a while due to her ARF.  Plan:  F/u with BCID for further vanc dosing   Height: 5' 8.5" (174 cm) Weight: 210 lb (95.3 kg) IBW/kg (Calculated) : 65.05  Temp (24hrs), Avg:97.7 F (36.5 C), Min:97.2 F (36.2 C), Max:98 F (36.7 C)  Recent Labs  Lab 01/21/19 1309 01/21/19 1335 01/21/19 1718 01/21/19 2042 01/22/19 0612  WBC 12.3*  --   --   --  9.8  CREATININE 4.89*  --   --  3.99* 3.55*  LATICACIDVEN  --  3.0* 2.5*  --   --     Estimated Creatinine Clearance: 16.9 mL/min (A) (by C-G formula based on SCr of 3.55 mg/dL (H)).    Allergies  Allergen Reactions  . Oxycodone Nausea Only    Antimicrobials this admission: 2/24 vancomycin >>  2/24 cefepime >> 2/25  Dose adjustments this admission:   Microbiology results:  2/24 blood>>GPC 2/24 MRSA>>neg 2/24 urine>>  Onnie Boer, PharmD, [place], AAHIVP, CPP Infectious Disease Pharmacist 01/22/2019 10:40 AM

## 2019-01-22 NOTE — Evaluation (Signed)
Physical Therapy Evaluation Patient Details Name: [name] MRN: [id number] DOB: 10-15-1946 Today's Date: 01/22/2019   History of Present Illness   [name] is a 73 year old female with PMH of MI/CAD status post PCI 2836, chronic diastolic CHF, DM 2/IDDM, HTN, HLD, recently hospitalized 12/06/2018-12/11/2018 when she presented with confusion and incontinence after starting meloxicam and baclofen for chest pain, treated in the hospital for acute metabolic encephalopathy due to E. coli UTI and baclofen, suspected mild dementia, musculoskeletal chest pain, discharged to [facility]. Was supposed to go home 2/24 when she was noted to be more confused and very weak. Has been unable to work with therapy at [facility].   Clinical Impression  Pt admitted with above diagnosis. Pt currently with functional limitations due to the deficits listed below (see PT Problem List). Pt with limited participation with therapy eval due to confusion, seeming UE and LE pain with movement and HR fluctuating as high as 150 bpm. Attempted to sit EOB but pt not assisting and did not tolerate well so returned to supine. At this point recommend return to SNF at d/c.  Pt will benefit from skilled PT to increase their independence and safety with mobility to allow discharge to the venue listed below.       Follow Up Recommendations SNF;Supervision/Assistance - 24 hour    Equipment Recommendations  None recommended by PT    Recommendations for Other Services       Precautions / Restrictions Precautions Precautions: Fall Restrictions Weight Bearing Restrictions: No      Mobility  Bed Mobility Overal bed mobility: Needs Assistance Bed Mobility: Rolling;Supine to Sit Rolling: Total assist   Supine to sit: Total assist     General bed mobility comments: pt tolerated rolling to L but did not assist. As soon as LE's were brought off bed, she began resisting movement and was able to get only partially to sitting  before she seemed unable to tolerate. HR remained in 70's with attempted sit  Transfers                 General transfer comment: unable  Ambulation/Gait             General Gait Details: unable  Stairs            Wheelchair Mobility    Modified Rankin (Stroke Patients Only)       Balance Overall balance assessment: Needs assistance Sitting-balance support: Bilateral upper extremity supported Sitting balance-Leahy Scale: Zero Sitting balance - Comments: unable to achieve full sitting                                     Pertinent Vitals/Pain Pain Assessment: Faces Faces Pain Scale: Hurts even more Pain Location: LE's and UE's to touch and mvmt Pain Descriptors / Indicators: Grimacing;Moaning Pain Intervention(s): Limited activity within patient's tolerance;Monitored during session    Home Living Family/patient expects to be discharged to:: Skilled nursing facility                 Additional Comments: per chart was about to d/c home from [facility] when status declined    Prior Function Level of Independence: Needs assistance   Gait / Transfers Assistance Needed: unsure when she was last ambulatory  ADL's / Homemaking Assistance Needed: needed assist at SNF for bathing and dressing        Hand Dominance   Dominant Hand:  Right    Extremity/Trunk Assessment   Upper Extremity Assessment Upper Extremity Assessment: RUE deficits/detail;Difficult to assess due to impaired cognition RUE Deficits / Details: BUE resting tremor R>L, grimaced when reaching across body facilitated    Lower Extremity Assessment Lower Extremity Assessment: Difficult to assess due to impaired cognition       Communication   Communication: Expressive difficulties(mumbling, much of speech not intelligible)  Cognition Arousal/Alertness: Lethargic Behavior During Therapy: Anxious Overall Cognitive Status: Impaired/Different from baseline Area of  Impairment: Orientation;Attention;Memory;Following commands;Safety/judgement;Awareness;Problem solving                 Orientation Level: Place;Time;Situation   Memory: Decreased short-term memory Following Commands: (not following commands) Safety/Judgement: Decreased awareness of safety;Decreased awareness of deficits   Problem Solving: Decreased initiation;Requires tactile cues;Slow processing General Comments: pt confused with unintelligible speech and some understandable but nonsensical. Lethargic and seems anxious when aroused      General Comments General comments (skin integrity, edema, etc.): HR remained in 70's most of session but jumped to 150 bpm several times for 2-3 secs, did not happen while she was mobilizing    Exercises     Assessment/Plan    PT Assessment Patient needs continued PT services  PT Problem List Decreased strength;Decreased activity tolerance;Decreased balance;Decreased mobility;Decreased range of motion;Decreased coordination;Decreased knowledge of use of DME;Decreased cognition;Decreased safety awareness;Decreased knowledge of precautions;Cardiopulmonary status limiting activity;Pain;Obesity       PT Treatment Interventions Functional mobility training;Therapeutic activities;Therapeutic exercise;Balance training;Neuromuscular re-education;Cognitive remediation;Patient/family education;DME instruction    PT Goals (Current goals can be found in the Care Plan section)  Acute Rehab PT Goals Patient Stated Goal: unable to state PT Goal Formulation: Patient unable to participate in goal setting Time For Goal Achievement: 02/05/19 Potential to Achieve Goals: Poor    Frequency Min 2X/week   Barriers to discharge        Co-evaluation               AM-PAC PT "6 Clicks" Mobility  Outcome Measure Help needed turning from your back to your side while in a flat bed without using bedrails?: Total Help needed moving from lying on your back to  sitting on the side of a flat bed without using bedrails?: Total Help needed moving to and from a bed to a chair (including a wheelchair)?: Total Help needed standing up from a chair using your arms (e.g., wheelchair or bedside chair)?: Total Help needed to walk in hospital room?: Total Help needed climbing 3-5 steps with a railing? : Total 6 Click Score: 6    End of Session Equipment Utilized During Treatment: Oxygen Activity Tolerance: Patient limited by lethargy;Patient limited by pain;Treatment limited secondary to medical complications (Comment)(unstable HR and decreased cognition) Patient left: in bed;with call bell/phone within reach;with bed alarm set;with nursing/sitter in room Nurse Communication: Mobility status PT Visit Diagnosis: Pain;Muscle weakness (generalized) (M62.81) Pain - part of body: Leg;Arm    Time: 1130-1145 PT Time Calculation (min) (ACUTE ONLY): 15 min   Charges:   PT Evaluation $PT Eval Moderate Complexity: [place]  Pager [phone] Office [place] 01/22/2019, 1:20 PM

## 2019-01-22 NOTE — Care Management Note (Addendum)
Case Management Note  Patient Details  Name: [name] MRN: [id number] Date of Birth: June 05, 1946  Subjective/Objective:   Hypovolemic shock. Hx of MI/CAD status post PCI 6468, chronic diastolic CHF, DM 2/IDDM, HTN, HLD. From [facility] SNF.Marland Kitchen Recently hospitalized 0/01/2121-4/82/5003, acute metabolic encephalopathy / E. coli UTI, and d/c to [facility].   [name]) Thomasenia Cooke (pastor)      [phone]     PCP: Shirline Frees  Action/Plan: PT/OT evaluations in place ....  NCM following for TOC needs ... CSW aware of potential need for SNF placement.  Expected Discharge Date:                  Expected Discharge Plan:  [place]  In-House Referral:  Clinical Social Work  Discharge planning Services  NA  Post Acute Care Choice:  NA Choice offered to:  NA  DME Arranged:  N/A DME Agency:  NA  HH Arranged:  NA HH Agency:  NA  Status of Service:  In process, will continue to follow  If discussed at Long Length of Stay Meetings, dates discussed:    Additional Comments:  Sharin Mons, RN 01/22/2019, 2:06 PM

## 2019-01-22 NOTE — Progress Notes (Signed)
During morning vitals pts HR went ST 150's then dropped down to 30's. She is SR 80's right now with BP of 156/121. Reported to [facility]. Pt has had low blood pressure most of the night.  No new orders at this time.

## 2019-01-22 NOTE — Progress Notes (Addendum)
PROGRESS NOTE        PATIENT DETAILS Name: [name] Age: 73 y.o. Sex: female Date of Birth: [birth year] Admit Date: 01/21/2019 Admitting Physician Modena Jansky, MD PPJ:[email or web address], [name], MD  Brief Narrative: Patient is a 73 y.o. female with prior history of CAD status post PCI in 1245, chronic diastolic heart failure, DM-2, hypertension, dyslipidemia presenting with acute metabolic encephalopathy secondary to acute kidney injury and sepsis with hypotension from gram-positive bacteremia.  See below for further details  Subjective: Confused-but awakes-at times she makes sense-at times she does not.  No family at bedside.  Assessment/Plan: Sepsis with hypotension secondary to gram-positive bacteremia: Blood cultures this morning positive for gram-positive cocci-awaiting BCID/culture results.  Hypotension has resolved with IV fluid boluses and initiation of antimicrobial therapy.  Stop cefepime-continue vancomycin-obtain TTE.  Once further culture results are obtained-we will discuss with infectious disease.  Acute renal failure: Suspect this is hemodynamically related-in the setting of hypotension-and the use of diuretics, ACEI, NSAIDs.  Creatinine slowly downtrending-BUN still significantly elevated and probably contributing to encephalopathy.  Still has significant metabolic acidosis-hence will change from normal saline to D5W with bicarb.  Spoke with patient's friend/HPOA-patient did not want aggressive care-we are hopeful that renal function will continue to improve-otherwise may need to involve palliative/hospice care.  Continue to avoid nephrotoxic agents.  Anion gap metabolic acidosis: Secondary to above-changing fluids to D5W with bicarb.  Hyponatremia: Resolved-suspect isotonic hypovolemic hyponatremia.  Follow electrolytes periodically  Acute metabolic encephalopathy: Secondary to acute renal failure and sepsis.  Nonfocal exam-seems to have improved  compared to ED note/H&P.  Hopeful that with continued supportive care-and improvement in renal function/sepsis physiology-that her mentation will improve further.  CAD s/p PCI 2014: Supportive care-no obvious anginal symptoms present.  Hypertension: Blood pressure improved-presented with hypotension.  Continue to hold all antihypertensives.  Chronic diastolic heart failure: Volume status stable-monitor closely while on IV fluids.  Continue to hold diuretics for now.  Insulin-dependent DM-2: CBG stable-continue SSI.  Continue to hold oral hypoglycemic agents and Actos for now.  Large multinodular goiter: Stable for outpatient follow-up-this is a chronic issue.  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  DVT Prophylaxis: Prophylactic Heparin   Code Status: DNR  Family Communication: None at bedside-spoke to patient's HPOA/friend listed in facesheet over the phone this morning  Disposition Plan: Remain inpatient  Antimicrobial agents: Anti-infectives (From admission, onward)   Start     Dose/Rate Route Frequency Ordered Stop   01/22/19 1200  ceFEPIme (MAXIPIME) 1 g in sodium chloride 0.9 % 100 mL IVPB     1 g 200 mL/hr over 30 Minutes Intravenous Every 24 hours 01/21/19 1446     01/21/19 1446  vancomycin variable dose per unstable renal function (pharmacist dosing)  Status:  Discontinued      Does not apply See admin instructions 01/21/19 1446 01/22/19 0941   01/21/19 1415  ceFEPIme (MAXIPIME) 2 g in sodium chloride 0.9 % 100 mL IVPB     2 g 200 mL/hr over 30 Minutes Intravenous  Once 01/21/19 1403 01/21/19 1449   01/21/19 1415  vancomycin (VANCOCIN) IVPB 1000 mg/200 mL premix  Status:  Discontinued     1,000 mg 200 mL/hr over 60 Minutes Intravenous  Once 01/21/19 1403 01/21/19 1411   01/21/19 1415  vancomycin (VANCOCIN) 2,500 mg in sodium chloride 0.9 %  500 mL IVPB     2,500 mg 250 mL/hr over 120 Minutes Intravenous  Once  01/21/19 1411 01/21/19 1937      Procedures: None  CONSULTS:  None  Time spent: 35 minutes-Greater than 50% of this time was spent in counseling, explanation of diagnosis, planning of further management, and coordination of care.  MEDICATIONS: Scheduled Meds: . heparin  5,000 Units Subcutaneous Q8H  . insulin aspart  0-5 Units Subcutaneous QHS  . insulin aspart  0-9 Units Subcutaneous TID WC  . sodium chloride flush  3 mL Intravenous Q12H   Continuous Infusions: . ceFEPime (MAXIPIME) IV    . dextrose 5 % 1,000 mL with sodium bicarbonate 150 mEq infusion     PRN Meds:.acetaminophen **OR** acetaminophen, albuterol, senna-docusate   PHYSICAL EXAM: Vital signs: Vitals:   01/22/19 0014 01/22/19 0400 01/22/19 0800 01/22/19 0942  BP: (!) 90/58 (!) 156/121 (!) 131/58   Pulse: 85 86 83 95  Resp: 19 19 (!) 26 19  Temp:  97.8 F (36.6 C) 98 F (36.7 C)   TempSrc:  Oral Axillary   SpO2: 98% 100% 100% 100%  Weight:      Height:       Filed Weights   01/21/19 1416  Weight: 95.3 kg   Body mass index is 31.47 kg/m.   General appearance :Awake confused-however not in any distress. Eyes:Pink conjunctiva HEENT: Atraumatic and Normocephalic Neck: supple Resp:Good air entry bilaterally, no added sounds  CVS: S1 S2 regular GI: Bowel sounds present, Non tender and not distended with no gaurding, rigidity or rebound.No organomegaly Extremities: B/L Lower Ext shows no edema, both legs are warm to touch Neurology: Nonfocal-moves all 4 extremities Psychiatric: Confused-unable to evaluate further Musculoskeletal:No digital cyanosis Skin:No Rash, warm and dry Wounds:N/A  I have personally reviewed following labs and imaging studies  LABORATORY DATA: CBC: Recent Labs  Lab 01/21/19 1309 01/22/19 0612  WBC 12.3* 9.8  NEUTROABS 10.0*  --   HGB 13.8 12.9  HCT 42.3 40.0  MCV 91.4 93.2  PLT 180 149*    Basic Metabolic Panel: Recent Labs  Lab 01/21/19 1309 01/21/19 2042  01/22/19 0612  NA 129* 135 138  K 4.9 4.8 4.2  CL 95* 106 107  CO2 15* 13* 14*  GLUCOSE 216* 120* 80  BUN 139* 124* 122*  CREATININE 4.89* 3.99* 3.55*  CALCIUM 8.7* 7.9* 7.6*    GFR: Estimated Creatinine Clearance: 16.9 mL/min (A) (by C-G formula based on SCr of 3.55 mg/dL (H)).  Liver Function Tests: Recent Labs  Lab 01/21/19 1309  AST 23  ALT 33  ALKPHOS 59  BILITOT 0.9  PROT 6.7  ALBUMIN 2.2*   No results for input(s): LIPASE, AMYLASE in the last 168 hours. No results for input(s): AMMONIA in the last 168 hours.  Coagulation Profile: No results for input(s): INR, PROTIME in the last 168 hours.  Cardiac Enzymes: No results for input(s): CKTOTAL, CKMB, CKMBINDEX, TROPONINI in the last 168 hours.  BNP (last 3 results) No results for input(s): PROBNP in the last 8760 hours.  HbA1C: No results for input(s): HGBA1C in the last 72 hours.  CBG: Recent Labs  Lab 01/21/19 2010 01/22/19 0805  GLUCAP 115* 83    Lipid Profile: No results for input(s): CHOL, HDL, LDLCALC, TRIG, CHOLHDL, LDLDIRECT in the last 72 hours.  Thyroid Function Tests: No results for input(s): TSH, T4TOTAL, FREET4, T3FREE, THYROIDAB in the last 72 hours.  Anemia Panel: No results for input(s): VITAMINB12, FOLATE, FERRITIN, TIBC,  IRON, RETICCTPCT in the last 72 hours.  Urine analysis:    Component Value Date/Time   COLORURINE YELLOW 01/21/2019 1620   APPEARANCEUR TURBID (A) 01/21/2019 1620   LABSPEC 1.020 01/21/2019 1620   PHURINE 8.0 01/21/2019 1620   GLUCOSEU NEGATIVE 01/21/2019 1620   HGBUR MODERATE (A) 01/21/2019 1620   BILIRUBINUR NEGATIVE 01/21/2019 1620   KETONESUR NEGATIVE 01/21/2019 1620   PROTEINUR 100 (A) 01/21/2019 1620   UROBILINOGEN 1.0 06/28/2015 1552   NITRITE NEGATIVE 01/21/2019 1620   LEUKOCYTESUR LARGE (A) 01/21/2019 1620    Sepsis Labs: Lactic Acid, Venous    Component Value Date/Time   LATICACIDVEN 2.5 (HH) 01/21/2019 1718    MICROBIOLOGY: Recent  Results (from the past 240 hour(s))  Blood Culture (routine x 2)     Status: None (Preliminary result)   Collection Time: 01/21/19  1:35 PM  Result Value Ref Range Status   Specimen Description BLOOD RIGHT ANTECUBITAL  Final   Special Requests   Final    BOTTLES DRAWN AEROBIC AND ANAEROBIC Blood Culture adequate volume   Culture  Setup Time   Final    GRAM POSITIVE COCCI ANAEROBIC BOTTLE ONLY Organism ID to follow Performed at [facility], [place] [address], [place], [place] [address]    Culture GRAM POSITIVE COCCI  Final   Report Status PENDING  Incomplete  Blood Culture (routine x 2)     Status: None (Preliminary result)   Collection Time: 01/21/19  1:35 PM  Result Value Ref Range Status   Specimen Description BLOOD RIGHT ANTECUBITAL  Final   Special Requests   Final    BOTTLES DRAWN AEROBIC AND ANAEROBIC Blood Culture adequate volume   Culture  Setup Time   Final    GRAM POSITIVE COCCI ANAEROBIC BOTTLE ONLY CRITICAL VALUE NOTED.  VALUE IS CONSISTENT WITH PREVIOUSLY REPORTED AND CALLED VALUE. Performed at [facility], [place] [address], [place], [place] [address]    Culture GRAM POSITIVE COCCI  Final   Report Status PENDING  Incomplete  MRSA PCR Screening     Status: None   Collection Time: 01/21/19  8:35 PM  Result Value Ref Range Status   MRSA by PCR NEGATIVE NEGATIVE Final    Comment:        The GeneXpert MRSA Assay (FDA approved for NASAL specimens only), is one component of a comprehensive MRSA colonization surveillance program. It is not intended to diagnose MRSA infection nor to guide or monitor treatment for MRSA infections. Performed at [facility], [place] [address], [place], [place] [address]     RADIOLOGY STUDIES/RESULTS: Dg Chest Port 1 View  Result Date: 01/21/2019 CLINICAL DATA:  Altered mental status/nonresponsive EXAM: PORTABLE CHEST 1 VIEW COMPARISON:  Chest radiograph December 06, 2018 and chest CT December 07, 2018 FINDINGS: There is no  appreciable edema or consolidation. Heart size and pulmonary vascularity are normal. No adenopathy. Patient is undergone previous mid lower thoracic kyphoplasty procedures. There is aortic atherosclerosis. Bones are osteoporotic. IMPRESSION: No edema or consolidation.  Stable cardiac silhouette. Aortic Atherosclerosis (ICD10-I70.0). Electronically Signed   By: Lowella Grip III M.D.   On: 01/21/2019 13:36     LOS: 1 day   Oren Binet, MD  [facility]  If 7PM-7AM, please contact night-coverage  Please page via [email or web address]  Go to [email or web address] and use [place]'s universal password to access. If you do not have the password, please contact the hospital operator.  Locate the [facility] provider you are looking for  under [facility] and page to a number that you can be directly reached. If you still have difficulty reaching the provider, please page the [facility] (Director on Call) for the Hospitalists listed on amion for assistance.  01/22/2019, 9:48 AM

## 2019-01-22 NOTE — Progress Notes (Signed)
Hypoglycemic Event  CBG: 68  Treatment: 4 oz juice/soda  Symptoms: None  Follow-up CBG: Time: 1240 CBG Result: 77  Possible Reasons for Event: Inadequate meal intake  Comments/MD notified: MD notified; will continue to monitor.    [name]

## 2019-01-23 DIAGNOSIS — I959 Hypotension, unspecified: Secondary | ICD-10-CM

## 2019-01-23 DIAGNOSIS — N179 Acute kidney failure, unspecified: Secondary | ICD-10-CM

## 2019-01-23 DIAGNOSIS — R4182 Altered mental status, unspecified: Secondary | ICD-10-CM

## 2019-01-23 DIAGNOSIS — I2510 Atherosclerotic heart disease of native coronary artery without angina pectoris: Secondary | ICD-10-CM

## 2019-01-23 DIAGNOSIS — I10 Essential (primary) hypertension: Secondary | ICD-10-CM

## 2019-01-23 DIAGNOSIS — Z885 Allergy status to narcotic agent status: Secondary | ICD-10-CM

## 2019-01-23 DIAGNOSIS — A419 Sepsis, unspecified organism: Secondary | ICD-10-CM

## 2019-01-23 LAB — CBC
HCT: 37.3 % (ref 36.0–46.0)
Hemoglobin: 13.1 g/dL (ref 12.0–15.0)
MCH: 30.8 pg (ref 26.0–34.0)
MCHC: 35.1 g/dL (ref 30.0–36.0)
MCV: 87.6 fL (ref 80.0–100.0)
Platelets: 104 10*3/uL — ABNORMAL LOW (ref 150–400)
RBC: 4.26 MIL/uL (ref 3.87–5.11)
RDW: 14 % (ref 11.5–15.5)
WBC: 7 10*3/uL (ref 4.0–10.5)
nRBC: 0 % (ref 0.0–0.2)

## 2019-01-23 LAB — GLUCOSE, CAPILLARY
Glucose-Capillary: 145 mg/dL — ABNORMAL HIGH (ref 70–99)
Glucose-Capillary: 144 mg/dL — ABNORMAL HIGH (ref 70–99)
Glucose-Capillary: 152 mg/dL — ABNORMAL HIGH (ref 70–99)
Glucose-Capillary: 152 mg/dL — ABNORMAL HIGH (ref 70–99)

## 2019-01-23 LAB — HEPATIC FUNCTION PANEL
ALT: UNDETERMINED U/L (ref 0–44)
AST: 18 U/L (ref 15–41)
Albumin: 1.8 g/dL — ABNORMAL LOW (ref 3.5–5.0)
Alkaline Phosphatase: 45 U/L (ref 38–126)
BILIRUBIN DIRECT: UNDETERMINED mg/dL (ref 0.0–0.2)
Total Bilirubin: UNDETERMINED mg/dL (ref 0.3–1.2)
Total Protein: 5.5 g/dL — ABNORMAL LOW (ref 6.5–8.1)

## 2019-01-23 LAB — URINE CULTURE: Culture: >=100000 — AB

## 2019-01-23 LAB — BASIC METABOLIC PANEL
Anion gap: 19 — ABNORMAL HIGH (ref 5–15)
BUN: 110 mg/dL — ABNORMAL HIGH (ref 8–23)
CO2: 15 mmol/L — AB (ref 22–32)
Calcium: 7.5 mg/dL — ABNORMAL LOW (ref 8.9–10.3)
Chloride: 106 mmol/L (ref 98–111)
Creatinine, Ser: 2.54 mg/dL — ABNORMAL HIGH (ref 0.44–1.00)
GFR calc Af Amer: 21 mL/min — ABNORMAL LOW (ref >60)
GFR calc non Af Amer: 18 mL/min — ABNORMAL LOW (ref >60)
GLUCOSE: 170 mg/dL — AB (ref 70–99)
Potassium: 3.8 mmol/L (ref 3.5–5.1)
Sodium: 140 mmol/L (ref 135–145)

## 2019-01-23 LAB — VANCOMYCIN, RANDOM: Vancomycin Rm: 25

## 2019-01-23 MED ORDER — METOPROLOL TARTRATE 12.5 MG HALF TABLET
12.5000 mg | ORAL_TABLET | Freq: Two times a day (BID) | ORAL | Status: DC
  Filled 2019-01-23 (×2): qty 1

## 2019-01-23 MED ORDER — SODIUM CHLORIDE 0.9 % IV SOLN
1.0000 g | Freq: Two times a day (BID) | INTRAVENOUS | Status: DC
  Administered 2019-01-24 (×3): 1 g via INTRAVENOUS
  Filled 2019-01-23 (×4): qty 1

## 2019-01-23 NOTE — Progress Notes (Signed)
CSW received SNF consult. Per [place], patient stayed with them 40 days and is in copay status (owes them money) and they will not accept patient back. CSW will have to conduct a new SNF search.   Melissa Locus [place] Dorning LCSW [phone]

## 2019-01-23 NOTE — Evaluation (Signed)
Occupational Therapy Evaluation Patient Details Name: [name] MRN: [id number] DOB: March 06, 1946 Today's Date: 01/23/2019    History of Present Illness  [name] is a 73 year old female with PMH of MI/CAD status post PCI 3419, chronic diastolic CHF, DM 2/IDDM, HTN, HLD, recently hospitalized 12/06/2018-12/11/2018 when she presented with confusion and incontinence after starting meloxicam and baclofen for chest pain, treated in the hospital for acute metabolic encephalopathy due to E. coli UTI and baclofen, suspected mild dementia, musculoskeletal chest pain, discharged to [facility]. Was supposed to go home 2/24 when she was noted to be more confused and very weak. Has been unable to work with therapy at [facility].    Clinical Impression   Pt admitted with the above diagnoses and presents with below problem list. Pt will benefit from continued acute OT to address the below listed deficits and maximize independence with basic ADLs prior to d/c to venue below. Per chart review at baseline pt was mod I with ADLs. Pt limited by lethargy and impaired cognition this session. Bed level eval as only +1 assist available at time of OT eval. Pt currently total A for ADLs. Not following commands. Oriented to person only.      Follow Up Recommendations  SNF    Equipment Recommendations  Other (comment)(defer to next venue)    Recommendations for Other Services       Precautions / Restrictions Precautions Precautions: Fall Restrictions Weight Bearing Restrictions: No      Mobility Bed Mobility               General bed mobility comments: unable  Transfers                 General transfer comment: unable    Balance                                           ADL either performed or assessed with clinical judgement   ADL Overall ADL's : Needs assistance/impaired Eating/Feeding: Total assistance   Grooming: Total assistance   Upper Body Bathing:  Total assistance   Lower Body Bathing: Total assistance   Upper Body Dressing : Total assistance   Lower Body Dressing: Total assistance                 General ADL Comments: bed level eval due to impaired cognition, level of arousal, and only +1 assist available during OT eval. Pt minimally attempting to help with wiping face with washcloth with multimodal cues given.     Vision         Perception     Praxis      Pertinent Vitals/Pain Pain Assessment: Faces Faces Pain Scale: Hurts little more Pain Location: unspecified, occasional grimacing Pain Descriptors / Indicators: Grimacing Pain Intervention(s): Monitored during session;Limited activity within patient's tolerance     Hand Dominance Right   Extremity/Trunk Assessment Upper Extremity Assessment Upper Extremity Assessment: Generalized weakness;Difficult to assess due to impaired cognition   Lower Extremity Assessment Lower Extremity Assessment: Defer to PT evaluation       Communication Communication Communication: Expressive difficulties(mumbling, much of speech not intelligible)   Cognition Arousal/Alertness: Lethargic Behavior During Therapy: Anxious Overall Cognitive Status: Impaired/Different from baseline Area of Impairment: Orientation;Attention;Memory;Following commands;Safety/judgement;Awareness;Problem solving                 Orientation Level: Place;Time;Situation Current Attention Level:  Focused Memory: Decreased short-term memory Following Commands: (not following commands) Safety/Judgement: Decreased awareness of safety;Decreased awareness of deficits   Problem Solving: Decreased initiation;Requires verbal cues;Requires tactile cues;Slow processing;Difficulty sequencing General Comments: pt confused with unintelligible speech and some understandable but nonsensical. Lethargic and seems anxious when aroused   General Comments  Inclined HOB a few degrees to promote more upright  position with pt reporting onset of nausea and HR jumping briefly to 123. Reclined HOB to previous position.     Exercises     Shoulder Instructions      Home Living Family/patient expects to be discharged to:: Skilled nursing facility                                 Additional Comments: per chart was about to d/c home from [place] when status declined      Prior Functioning/Environment Level of Independence: Needs assistance  Gait / Transfers Assistance Needed: unsure when she was last ambulatory ADL's / Homemaking Assistance Needed: needed assist at SNF for bathing and dressing   Comments: baseline: uses walker for mobility, has assist with yardwork, cleaning; able to complete minimal cooking, independent medication and finances. All PLOF data from chart review as pt unable to provide and no family present during OT eval.        OT Problem List: Decreased strength;Decreased activity tolerance;Impaired balance (sitting and/or standing);Decreased cognition;Decreased safety awareness;Decreased knowledge of use of DME or AE;Decreased knowledge of precautions;Cardiopulmonary status limiting activity;Obesity;Pain      OT Treatment/Interventions: Self-care/ADL training;Therapeutic exercise;DME and/or AE instruction;Therapeutic activities;Cognitive remediation/compensation;Patient/family education;Balance training    OT Goals(Current goals can be found in the care plan section) Acute Rehab OT Goals Patient Stated Goal: unable to state OT Goal Formulation: Patient unable to participate in goal setting Time For Goal Achievement: 02/06/19 Potential to Achieve Goals: Good ADL Goals Pt Will Perform Grooming: with mod assist;bed level Pt Will Perform Upper Body Bathing: with mod assist;sitting;bed level Pt Will Perform Lower Body Bathing: with mod assist;sitting/lateral leans Additional ADL Goal #1: Pt will complete bed mobility at mod A +2 to prepare for EOB ADLs.  OT  Frequency: Min 2X/week   Barriers to D/C:            Co-evaluation              AM-PAC OT "6 Clicks" Daily Activity     Outcome Measure Help from another person eating meals?: Total Help from another person taking care of personal grooming?: Total Help from another person toileting, which includes using toliet, bedpan, or urinal?: Total Help from another person bathing (including washing, rinsing, drying)?: Total Help from another person to put on and taking off regular upper body clothing?: Total Help from another person to put on and taking off regular lower body clothing?: Total 6 Click Score: 6   End of Session Nurse Communication: Other (comment)(nausea when attempted to incline [facility])  Activity Tolerance: Patient limited by lethargy Patient left: in bed;with call bell/phone within reach;with bed alarm set  OT Visit Diagnosis: Unsteadiness on feet (R26.81);Muscle weakness (generalized) (M62.81);Pain;Other symptoms and signs involving cognitive function;Cognitive communication deficit (R41.841)                Time: 1348-1400 OT Time Calculation (min): 12 min Charges:  OT General Charges $OT Visit: 1 Visit OT Evaluation $OT Eval Low Complexity: Stella, OT Acute Rehabilitation Services Pager: [phone] Office: [phone]  Hortencia Pilar 01/23/2019, 2:16 PM

## 2019-01-23 NOTE — Progress Notes (Signed)
Pharmacy Antibiotic Note  [name] is a 73 y.o. female admitted on 01/21/2019 with ESBL UTI, ?CoNS bacteremia.  Pharmacy has been consulted for Merrem dosing. Already on vancomycin. Noted renal dysfunction.   Plan: Add Merrem 1g IV q12h  Height: 5' 8.5" (174 cm) Weight: 223 lb 15.8 oz (101.6 kg) IBW/kg (Calculated) : 65.05  Temp (24hrs), Avg:98.6 F (37 C), Min:98.6 F (37 C), Max:98.6 F (37 C)  Recent Labs  Lab 01/21/19 1309 01/21/19 1335 01/21/19 1718 01/21/19 2042 01/22/19 0612 01/23/19 0351 01/23/19 1311  WBC 12.3*  --   --   --  9.8 7.0  --   CREATININE 4.89*  --   --  3.99* 3.55* 2.54*  --   LATICACIDVEN  --  3.0* 2.5*  --   --   --   --   VANCORANDOM  --   --   --   --   --   --  25    Estimated Creatinine Clearance: 24.4 mL/min (A) (by C-G formula based on SCr of 2.54 mg/dL (H)).    Allergies  Allergen Reactions  . Oxycodone Nausea Only    Narda Bonds 01/23/2019 11:54 PM

## 2019-01-23 NOTE — Consult Note (Signed)
[place] for Infectious Disease  Total days of antibiotics 2               Reason for Consult:sepsis    Referring Physician: ghimire  Principal Problem:   Hypovolemic shock ([place]) Active Problems:   Dehydration   HTN (hypertension)   Uncontrolled type 2 diabetes mellitus with hyperglycemia ([place])   Acute metabolic encephalopathy   Physical deconditioning    HPI: [name] is a 73 y.o. female with hxi of CAD, HTN,admitted for sepsis with hypotension, aki, AMS brought in from SNF bust was originally to discharged home. Infectious work up revealed UA+, urine cx ESBL proteus, and blood cx showing CoNS in 3/4 bottles. History of symptoms difficulty to assess to whether she had significant urinary symptoms. She remains afebrile but still not back to her baseline health, still lethargic during this visit  Past Medical History:  Diagnosis Date  . Anxiety    Due to current back pain and has to lie flat.  . Arthritis   . CAD (coronary artery disease)    a. NSTEMI in setting of UTI and SVT 12/2012 => LHC 01/08/13: Proximal LAD 30%, ostial diagonal 30-40%, proximal RCA 95%, inferior AK, EF 45-50% => PCI: Promus DES x 2 to RCA;  b. Echo 01/05/13: EF 09-29%, grade 1 diastolic dysfunction, MAC   . Compression fracture    thoracic 12  . Edema   . History of blood transfusion   . Hypercholesteremia   . Hypertension   . Myocardial infarction ([place]) 2014  . Rectal ulceration    colonoscopy 12/2012  . SVT (supraventricular tachycardia) ([place])   . Type II diabetes mellitus (H[facility])     Allergies:  Allergies  Allergen Reactions  . Oxycodone Nausea Only    Current antibiotics:   MEDICATIONS: . heparin  5,000 Units Subcutaneous Q8H  . insulin aspart  0-5 Units Subcutaneous QHS  . insulin aspart  0-9 Units Subcutaneous TID WC  . metoprolol tartrate  12.5 mg Oral BID  . sodium chloride flush  3 mL Intravenous Q12H    Social History   Tobacco Use  . Smoking status: Never Smoker    . Smokeless tobacco: Never Used  Substance Use Topics  . Alcohol use: No  . Drug use: No    Family History  Problem Relation Age of Onset  . Hypertension Brother     Review of Systems -  Unable to obtain since solomnent  OBJECTIVE: Temp:  [98.6 F (37 C)] 98.6 F (37 C) (02/26 0320) Pulse Rate:  [90-113] 98 (02/26 1152) Resp:  [13-28] 13 (02/26 1152) BP: (106-125)/(57-102) 106/57 (02/26 1152) SpO2:  [93 %-98 %] 96 % (02/26 1152) Weight:  [101.6 kg] 101.6 kg (02/26 5747) Physical Exam  Constitutional:  soloment. appears well-developed and well-nourished. No distress.  HENT: [place]/AT, PERRLA, no scleral icterus Mouth/Throat: Oropharynx is clear and moist. No oropharyngeal exudate.  Cardiovascular: Normal rate, regular rhythm and normal heart sounds. Exam reveals no gallop and no friction rub.  No murmur heard.  Pulmonary/Chest: Effort normal and breath sounds normal. No respiratory distress.  has no wheezes.  Neck = supple, no nuchal rigidity Abdominal: Soft. Bowel sounds are normal.  exhibits no distension. There is no tenderness.  Lymphadenopathy: no cervical adenopathy. No axillary adenopathy Neurological: moves extremities to tactile stimlation Ext: pitting edema Skin: Skin is warm and dry. No rash noted. No erythema.     LABS: Results for orders placed or performed during the hospital  encounter of 01/21/19 (from the past 48 hour(s))  Basic metabolic panel     Status: Abnormal   Collection Time: 01/22/19  6:12 AM  Result Value Ref Range   Sodium 138 135 - 145 mmol/L   Potassium 4.2 3.5 - 5.1 mmol/L   Chloride 107 98 - 111 mmol/L   CO2 14 (L) 22 - 32 mmol/L   Glucose, Bld 80 70 - 99 mg/dL   BUN 122 (H) 8 - 23 mg/dL   Creatinine, Ser 3.55 (H) 0.44 - 1.00 mg/dL   Calcium 7.6 (L) 8.9 - 10.3 mg/dL   GFR calc non Af Amer 12 (L) >60 mL/min   GFR calc Af Amer 14 (L) >60 mL/min   Anion gap 17 (H) 5 - 15    Comment: Performed at [facility], [address]  [place], [place] [address]  CBC     Status: Abnormal   Collection Time: 01/22/19  6:12 AM  Result Value Ref Range   WBC 9.8 4.0 - 10.5 K/uL   RBC 4.29 3.87 - 5.11 MIL/uL   Hemoglobin 12.9 12.0 - 15.0 g/dL   HCT 40.0 36.0 - 46.0 %   MCV 93.2 80.0 - 100.0 fL   MCH 30.1 26.0 - 34.0 pg   MCHC 32.3 30.0 - 36.0 g/dL   RDW 14.0 11.5 - 15.5 %   Platelets 149 (L) 150 - 400 K/uL   nRBC 0.0 0.0 - 0.2 %    Comment: Performed at [facility], [place] [address] [place], [place] [address]  Glucose, capillary     Status: None   Collection Time: 01/22/19  8:05 AM  Result Value Ref Range   Glucose-Capillary 83 70 - 99 mg/dL  Glucose, capillary     Status: Abnormal   Collection Time: 01/22/19 12:08 PM  Result Value Ref Range   Glucose-Capillary 68 (L) 70 - 99 mg/dL   Comment 1 Notify RN   Glucose, capillary     Status: None   Collection Time: 01/22/19 12:40 PM  Result Value Ref Range   Glucose-Capillary 77 70 - 99 mg/dL  Glucose, capillary     Status: None   Collection Time: 01/22/19  4:44 PM  Result Value Ref Range   Glucose-Capillary 95 70 - 99 mg/dL  Glucose, capillary     Status: Abnormal   Collection Time: 01/22/19  9:35 PM  Result Value Ref Range   Glucose-Capillary 168 (H) 70 - 99 mg/dL  CBC     Status: Abnormal   Collection Time: 01/23/19  3:51 AM  Result Value Ref Range   WBC 7.0 4.0 - 10.5 K/uL   RBC 4.26 3.87 - 5.11 MIL/uL   Hemoglobin 13.1 12.0 - 15.0 g/dL   HCT 37.3 36.0 - 46.0 %   MCV 87.6 80.0 - 100.0 fL   MCH 30.8 26.0 - 34.0 pg   MCHC 35.1 30.0 - 36.0 g/dL   RDW 14.0 11.5 - 15.5 %   Platelets 104 (L) 150 - 400 K/uL    Comment: REPEATED TO VERIFY PLATELET COUNT CONFIRMED BY SMEAR Immature Platelet Fraction may be clinically indicated, consider ordering this additional test [id number]    nRBC 0.0 0.0 - 0.2 %    Comment: Performed at [facility], [place] [address], [place], [place] [address]  Basic metabolic panel     Status: Abnormal   Collection Time: 01/23/19   3:51 AM  Result Value Ref Range   Sodium 140 135 -  145 mmol/L   Potassium 3.8 3.5 - 5.1 mmol/L   Chloride 106 98 - 111 mmol/L   CO2 15 (L) 22 - 32 mmol/L   Glucose, Bld 170 (H) 70 - 99 mg/dL   BUN 110 (H) 8 - 23 mg/dL   Creatinine, Ser 2.54 (H) 0.44 - 1.00 mg/dL    Comment: DELTA CHECK NOTED   Calcium 7.5 (L) 8.9 - 10.3 mg/dL   GFR calc non Af Amer 18 (L) >60 mL/min   GFR calc Af Amer 21 (L) >60 mL/min   Anion gap 19 (H) 5 - 15    Comment: Performed at [facility] [address] [place], [place] [address]  Hepatic function panel     Status: Abnormal   Collection Time: 01/23/19  3:51 AM  Result Value Ref Range   Total Protein 5.5 (L) 6.5 - 8.1 g/dL   Albumin 1.8 (L) 3.5 - 5.0 g/dL   AST 18 15 - 41 U/L   ALT QUANTITY NOT SUFFICIENT, UNABLE TO PERFORM TEST 0 - 44 U/L   Alkaline Phosphatase 45 38 - 126 U/L   Total Bilirubin QUANTITY NOT SUFFICIENT, UNABLE TO PERFORM TEST 0.3 - 1.2 mg/dL   Bilirubin, Direct QUANTITY NOT SUFFICIENT, UNABLE TO PERFORM TEST 0.0 - 0.2 mg/dL   Indirect Bilirubin NOT CALCULATED 0.3 - 0.9 mg/dL    Comment: Performed at [facility], [place] [address] [place], [place] [address]  Glucose, capillary     Status: Abnormal   Collection Time: 01/23/19  7:47 AM  Result Value Ref Range   Glucose-Capillary 145 (H) 70 - 99 mg/dL  Glucose, capillary     Status: Abnormal   Collection Time: 01/23/19 12:20 PM  Result Value Ref Range   Glucose-Capillary 144 (H) 70 - 99 mg/dL  Vancomycin, random     Status: None   Collection Time: 01/23/19  1:11 PM  Result Value Ref Range   Vancomycin Rm 25     Comment:        Random Vancomycin therapeutic range is dependent on dosage and time of specimen collection. A peak range is 20.0-40.0 ug/mL A trough range is 5.0-15.0 ug/mL        Performed at [facility] [address] [place], [place] [address]   Glucose, capillary     Status: Abnormal   Collection Time: 01/23/19  4:50 PM  Result Value Ref Range    Glucose-Capillary 152 (H) 70 - 99 mg/dL    MICRO: reviewed IMAGING: No results found.  HISTORICAL MICRO/IMAGING  Assessment/Plan:  Sepsis of unknown etiology. Given that acute onset, I suspect gram negative infection. It would be difficult to believe that CoNS bacteremia would have acute onset has her clinical presentation. Since she is not back to her baseline - I would favor to give carbapenem for esbl proteus - continue with treatment of CoNs bacteremia - repeat blood cx today to see if bacteremia cleared - if still persistently bacteremic,then would pursue TEE. - will see if improvement in symptoms in the morning to decide if further work up is needed

## 2019-01-23 NOTE — Progress Notes (Signed)
Pharmacy Antibiotic Note  [name] is a 73 y.o. female admitted on 01/21/2019 with sepsis.  Blood cultures came back with 2/4 bottles with GPC. Blood is now positive with 3/4 bottles with staph epi. She got a vanc load the other day due to ARF. Scr is trending down to 2.55. Her random vanc level is 25 today. We will continue to dose based on levels for now until renal is more stable.   ESBL in urine but prob not significant since she is getting better.  Plan:  Repeat random level in AM   Height: 5' 8.5" (174 cm) Weight: 223 lb 15.8 oz (101.6 kg) IBW/kg (Calculated) : 65.05  Temp (24hrs), Avg:98.1 F (36.7 C), Min:97.3 F (36.3 C), Max:98.6 F (37 C)  Recent Labs  Lab 01/21/19 1309 01/21/19 1335 01/21/19 1718 01/21/19 2042 01/22/19 0612 01/23/19 0351 01/23/19 1311  WBC 12.3*  --   --   --  9.8 7.0  --   CREATININE 4.89*  --   --  3.99* 3.55* 2.54*  --   LATICACIDVEN  --  3.0* 2.5*  --   --   --   --   VANCORANDOM  --   --   --   --   --   --  25    Estimated Creatinine Clearance: 24.4 mL/min (A) (by C-G formula based on SCr of 2.54 mg/dL (H)).    Allergies  Allergen Reactions  . Oxycodone Nausea Only    Antimicrobials this admission: 2/24 vancomycin >>  2/24 cefepime >> 2/25  Dose adjustments this admission:   Microbiology results:  2/24 blood>>3/4 staph epi 2/24 MRSA>>neg 2/24 urine>>ESBL proteus  Onnie Boer, PharmD, BCIDP, AAHIVP, CPP Infectious Disease Pharmacist 01/23/2019 1:50 PM

## 2019-01-23 NOTE — Progress Notes (Signed)
PROGRESS NOTE        PATIENT DETAILS Name: [name] Age: 73 y.o. Sex: female Date of Birth: 19-Aug-1946 Admit Date: 01/21/2019 Admitting Physician Modena Jansky, MD [id number], [name], MD  Brief Narrative: Patient is a 73 y.o. female with prior history of CAD status post PCI in 3557, chronic diastolic heart failure, DM-2, hypertension, dyslipidemia presenting with acute metabolic encephalopathy secondary to acute kidney injury and sepsis with hypotension from gram-positive bacteremia.  See below for further details  Subjective: More awake and somewhat more alert-less confused and yesterday.  She still not yet back to baseline.  Assessment/Plan: Sepsis with hypotension secondary to staph epidermidis bacteremia: 3/4 bottles positive for staph epidermidis, urine culture positive for ESBL Proteus.  Sepsis pathophysiology has improved with just IV fluids and IV vancomycin.  Not sure we need to treat ESBL Proteus in the urine at this point-as she has improved-given confusion very hard to discern if she is actually symptomatic.  Will discuss case with ID over the phone.  Repeat blood cultures today.  Acute renal failure: Suspect this is hemodynamically related-in the setting of hypotension-and the use of diuretics, ACEI, NSAIDs.  Urine output is slowly picking up (1.4 L overnight) creatinine slowly downtrending.  BUN also significantly elevated continues to downtrend as well.  Metabolic acidosis slowly improving.  Continue hydration with D5W with bicarb.  Avoid nephrotoxic agents.  Follow electrolytes.    Anion gap metabolic acidosis: Secondary to above-continue D5W with bicarb.  Hyponatremia: Resolved-suspect isotonic hypovolemic hyponatremia.  Follow electrolytes periodically  Acute metabolic encephalopathy: Secondary to acute renal failure and sepsis.  Nonfocal exam-slowly improving.  Hopeful that with continued supportive care-and improvement in renal  function/sepsis physiology-that her mentation will improve further.  CAD s/p PCI 2014: Supportive care-no obvious anginal symptoms present.  Hypertension: Blood pressure improved-presented with hypotension.  Appears to be having some short bursts of SVT/narrow complex tachycardia-start low-dose metoprolol today.  Chronic diastolic heart failure: Volume status stable-monitor closely while on IV fluids.  Continue to hold diuretics for now.  Insulin-dependent DM-2: CBG stable-continue SSI.  Continue to hold oral hypoglycemic agents and Actos for now.  Large multinodular goiter: Stable for outpatient follow-up-this is a chronic issue.  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  DVT Prophylaxis: Prophylactic Heparin   Code Status: DNR  Family Communication: None at bedside  Disposition Plan: Remain inpatient  Antimicrobial agents: Anti-infectives (From admission, onward)   Start     Dose/Rate Route Frequency Ordered Stop   01/22/19 1200  ceFEPIme (MAXIPIME) 1 g in sodium chloride 0.9 % 100 mL IVPB  Status:  Discontinued     1 g 200 mL/hr over 30 Minutes Intravenous Every 24 hours 01/21/19 1446 01/22/19 0956   01/21/19 1446  vancomycin variable dose per unstable renal function (pharmacist dosing)  Status:  Discontinued      Does not apply See admin instructions 01/21/19 1446 01/22/19 0941   01/21/19 1415  ceFEPIme (MAXIPIME) 2 g in sodium chloride 0.9 % 100 mL IVPB     2 g 200 mL/hr over 30 Minutes Intravenous  Once 01/21/19 1403 01/21/19 1449   01/21/19 1415  vancomycin (VANCOCIN) IVPB 1000 mg/200 mL premix  Status:  Discontinued     1,000 mg 200 mL/hr over 60 Minutes Intravenous  Once 01/21/19 1403 01/21/19 1411   01/21/19 1415  vancomycin (VANCOCIN) 2,500 mg in sodium chloride 0.9 % 500 mL IVPB     2,500 mg 250 mL/hr over 120 Minutes Intravenous  Once 01/21/19 1411 01/21/19 1937      Procedures: None  CONSULTS:   None  Time spent: 35 minutes-Greater than 50% of this time was spent in counseling, explanation of diagnosis, planning of further management, and coordination of care.  MEDICATIONS: Scheduled Meds: . heparin  5,000 Units Subcutaneous Q8H  . insulin aspart  0-5 Units Subcutaneous QHS  . insulin aspart  0-9 Units Subcutaneous TID WC  . metoprolol tartrate  12.5 mg Oral BID  . sodium chloride flush  3 mL Intravenous Q12H   Continuous Infusions: . dextrose 5 % 1,000 mL with sodium bicarbonate 150 mEq infusion 100 mL/hr at 01/22/19 2202   PRN Meds:.acetaminophen **OR** acetaminophen, albuterol, ondansetron (ZOFRAN) IV, senna-docusate   PHYSICAL EXAM: Vital signs: Vitals:   01/22/19 2150 01/22/19 2355 01/23/19 0320 01/23/19 0648  BP: 110/66 114/85 (!) 125/102   Pulse: 72 90 (!) 113   Resp: 19 15 (!) 28   Temp: 98.3 F (36.8 C)  98.6 F (37 C)   TempSrc: Oral  Oral   SpO2: 98% 93% 98%   Weight:    101.6 kg  Height:       Filed Weights   01/21/19 1416 01/22/19 1817 01/23/19 0648  Weight: 95.3 kg 100.6 kg 101.6 kg   Body mass index is 33.56 kg/m.   General appearance: More awake-still confused.  Not in any distress. Eyes:no scleral icterus. HEENT: Atraumatic and Normocephalic Neck: supple Resp:Good air entry bilaterally,no rales or rhonchi CVS: S1 S2 regular GI: Bowel sounds present, Non tender and not distended with no gaurding, rigidity or rebound. Extremities: B/L Lower Ext shows no edema, both legs are warm to touch- lower extremities appear puffy. Neurology:  Non focal-moves all 4 extremities Musculoskeletal:No digital cyanosis Skin:No Rash, warm and dry Wounds:N/A  I have personally reviewed following labs and imaging studies  LABORATORY DATA: CBC: Recent Labs  Lab 01/21/19 1309 01/22/19 0612 01/23/19 0351  WBC 12.3* 9.8 7.0  NEUTROABS 10.0*  --   --   HGB 13.8 12.9 13.1  HCT 42.3 40.0 37.3  MCV 91.4 93.2 87.6  PLT 180 149* 104*    Basic Metabolic  Panel: Recent Labs  Lab 01/21/19 1309 01/21/19 2042 01/22/19 0612 01/23/19 0351  NA 129* 135 138 140  K 4.9 4.8 4.2 3.8  CL 95* 106 107 106  CO2 15* 13* 14* 15*  GLUCOSE 216* 120* 80 170*  BUN 139* 124* 122* 110*  CREATININE 4.89* 3.99* 3.55* 2.54*  CALCIUM 8.7* 7.9* 7.6* 7.5*    GFR: Estimated Creatinine Clearance: 24.4 mL/min (A) (by C-G formula based on SCr of 2.54 mg/dL (H)).  Liver Function Tests: Recent Labs  Lab 01/21/19 1309 01/23/19 0351  AST 23 18  ALT 33 QUANTITY NOT SUFFICIENT, UNABLE TO PERFORM TEST  ALKPHOS 59 45  BILITOT 0.9 QUANTITY NOT SUFFICIENT, UNABLE TO PERFORM TEST  PROT 6.7 5.5*  ALBUMIN 2.2* 1.8*   No results for input(s): LIPASE, AMYLASE in the last 168 hours. No results for input(s): AMMONIA in the last 168 hours.  Coagulation Profile: No results for input(s): INR, PROTIME in the last 168 hours.  Cardiac Enzymes: No results for input(s): CKTOTAL, CKMB, CKMBINDEX, TROPONINI in the last 168 hours.  BNP (last 3 results) No results for input(s): PROBNP in the last 8760 hours.  HbA1C: No results for input(s): HGBA1C in  the last 72 hours.  CBG: Recent Labs  Lab 01/22/19 1208 01/22/19 1240 01/22/19 1644 01/22/19 2135 01/23/19 0747  GLUCAP 68* 77 95 168* 145*    Lipid Profile: No results for input(s): CHOL, HDL, LDLCALC, TRIG, CHOLHDL, LDLDIRECT in the last 72 hours.  Thyroid Function Tests: No results for input(s): TSH, T4TOTAL, FREET4, T3FREE, THYROIDAB in the last 72 hours.  Anemia Panel: No results for input(s): VITAMINB12, FOLATE, FERRITIN, TIBC, IRON, RETICCTPCT in the last 72 hours.  Urine analysis:    Component Value Date/Time   COLORURINE YELLOW 01/21/2019 1620   APPEARANCEUR TURBID (A) 01/21/2019 1620   LABSPEC 1.020 01/21/2019 1620   PHURINE 8.0 01/21/2019 1620   GLUCOSEU NEGATIVE 01/21/2019 1620   HGBUR MODERATE (A) 01/21/2019 1620   BILIRUBINUR NEGATIVE 01/21/2019 1620   KETONESUR NEGATIVE 01/21/2019 1620    PROTEINUR 100 (A) 01/21/2019 1620   UROBILINOGEN 1.0 06/28/2015 1552   NITRITE NEGATIVE 01/21/2019 1620   LEUKOCYTESUR LARGE (A) 01/21/2019 1620    Sepsis Labs: Lactic Acid, Venous    Component Value Date/Time   LATICACIDVEN 2.5 (HH) 01/21/2019 1718    MICROBIOLOGY: Recent Results (from the past 240 hour(s))  Blood Culture (routine x 2)     Status: Abnormal (Preliminary result)   Collection Time: 01/21/19  1:35 PM  Result Value Ref Range Status   Specimen Description BLOOD RIGHT ANTECUBITAL  Final   Special Requests   Final    BOTTLES DRAWN AEROBIC AND ANAEROBIC Blood Culture adequate volume   Culture  Setup Time   Final    GRAM POSITIVE COCCI IN BOTH AEROBIC AND ANAEROBIC BOTTLES Organism ID to follow CRITICAL RESULT CALLED TO, READ BACK BY AND VERIFIED WITH: Karlene Einstein PharmD 11:00 01/22/19 ([email or web address])    Culture (A)  Final    STAPHYLOCOCCUS EPIDERMIDIS SUSCEPTIBILITIES TO FOLLOW Performed at [facility], [place] [address], [place], [place] [address]    Report Status PENDING  Incomplete  Blood Culture (routine x 2)     Status: Abnormal (Preliminary result)   Collection Time: 01/21/19  1:35 PM  Result Value Ref Range Status   Specimen Description BLOOD RIGHT ANTECUBITAL  Final   Special Requests   Final    BOTTLES DRAWN AEROBIC AND ANAEROBIC Blood Culture adequate volume   Culture  Setup Time   Final    GRAM POSITIVE COCCI ANAEROBIC BOTTLE ONLY CRITICAL VALUE NOTED.  VALUE IS CONSISTENT WITH PREVIOUSLY REPORTED AND CALLED VALUE. Performed at [facility], [place] [address] [place],  [address]    Culture STAPHYLOCOCCUS EPIDERMIDIS (A)  Final   Report Status PENDING  Incomplete  Blood Culture ID Panel (Reflexed)     Status: Abnormal   Collection Time: 01/21/19  1:35 PM  Result Value Ref Range Status   Enterococcus species NOT DETECTED NOT DETECTED Final   Listeria monocytogenes NOT DETECTED NOT DETECTED Final   Staphylococcus species DETECTED (A) NOT  DETECTED Final    Comment: Methicillin (oxacillin) resistant coagulase negative staphylococcus. Possible blood culture contaminant (unless isolated from more than one blood culture draw or clinical case suggests pathogenicity). No antibiotic treatment is indicated for blood  culture contaminants. CRITICAL RESULT CALLED TO, READ BACK BY AND VERIFIED WITH: Karlene Einstein PharmD 11:00 01/22/19 ([email or web address])    Staphylococcus aureus (BCID) NOT DETECTED NOT DETECTED Final   Methicillin resistance DETECTED (A) NOT DETECTED Final    Comment: CRITICAL RESULT CALLED TO, READ BACK BY AND VERIFIED WITH: Karlene Einstein PharmD 11:00 01/22/19 ([email or web address])  Streptococcus species NOT DETECTED NOT DETECTED Final   Streptococcus agalactiae NOT DETECTED NOT DETECTED Final   Streptococcus pneumoniae NOT DETECTED NOT DETECTED Final   Streptococcus pyogenes NOT DETECTED NOT DETECTED Final   Acinetobacter baumannii NOT DETECTED NOT DETECTED Final   Enterobacteriaceae species NOT DETECTED NOT DETECTED Final   Enterobacter cloacae complex NOT DETECTED NOT DETECTED Final   Escherichia coli NOT DETECTED NOT DETECTED Final   Klebsiella oxytoca NOT DETECTED NOT DETECTED Final   Klebsiella pneumoniae NOT DETECTED NOT DETECTED Final   Proteus species NOT DETECTED NOT DETECTED Final   Serratia marcescens NOT DETECTED NOT DETECTED Final   Haemophilus influenzae NOT DETECTED NOT DETECTED Final   Neisseria meningitidis NOT DETECTED NOT DETECTED Final   Pseudomonas aeruginosa NOT DETECTED NOT DETECTED Final   Candida albicans NOT DETECTED NOT DETECTED Final   Candida glabrata NOT DETECTED NOT DETECTED Final   Candida krusei NOT DETECTED NOT DETECTED Final   Candida parapsilosis NOT DETECTED NOT DETECTED Final   Candida tropicalis NOT DETECTED NOT DETECTED Final    Comment: Performed at [facility], [place] [address], [place], [place] [address]  Urine culture     Status: Abnormal   Collection Time: 01/21/19  4:20 PM  Result  Value Ref Range Status   Specimen Description URINE, RANDOM  Final   Special Requests NONE  Final   Culture (A)  Final    >=100,000 COLONIES/mL PROTEUS MIRABILIS Susceptibility Pattern Suggests Possibility of an Extended Spectrum Beta Lactamase Producer. Contact Laboratory Within 7 Days if Confirmation Warranted. Performed at [facility], [place] [address], [place], [place] [address]    Report Status 01/23/2019 FINAL  Final   Organism ID, Bacteria PROTEUS MIRABILIS (A)  Final      Susceptibility   Proteus mirabilis - MIC*    AMPICILLIN >=32 RESISTANT Resistant     CEFAZOLIN >=64 RESISTANT Resistant     CEFTRIAXONE RESISTANT Resistant     CIPROFLOXACIN >=4 RESISTANT Resistant     GENTAMICIN <=1 SENSITIVE Sensitive     IMIPENEM 2 SENSITIVE Sensitive     NITROFURANTOIN 128 RESISTANT Resistant     TRIMETH/SULFA >=320 RESISTANT Resistant     AMPICILLIN/SULBACTAM >=32 RESISTANT Resistant     PIP/TAZO <=4 SENSITIVE Sensitive     * >=100,000 COLONIES/mL PROTEUS MIRABILIS  MRSA PCR Screening     Status: None   Collection Time: 01/21/19  8:35 PM  Result Value Ref Range Status   MRSA by PCR NEGATIVE NEGATIVE Final    Comment:        The GeneXpert MRSA Assay (FDA approved for NASAL specimens only), is one component of a comprehensive MRSA colonization surveillance program. It is not intended to diagnose MRSA infection nor to guide or monitor treatment for MRSA infections. Performed at [facility], [place] [address] [place],  [address]     RADIOLOGY STUDIES/RESULTS: Dg Chest Port 1 View  Result Date: 01/21/2019 CLINICAL DATA:  Altered mental status/nonresponsive EXAM: PORTABLE CHEST 1 VIEW COMPARISON:  Chest radiograph December 06, 2018 and chest CT December 07, 2018 FINDINGS: There is no appreciable edema or consolidation. Heart size and pulmonary vascularity are normal. No adenopathy. Patient is undergone previous mid lower thoracic kyphoplasty procedures. There is  aortic atherosclerosis. Bones are osteoporotic. IMPRESSION: No edema or consolidation.  Stable cardiac silhouette. Aortic Atherosclerosis (ICD10-I70.0). Electronically Signed   By: Lowella Grip III M.D.   On: 01/21/2019 13:36     LOS: 2 days  Oren Binet, MD  [facility]  If 7PM-7AM, please contact night-coverage  Please page via [email or web address]  Go to [email or web address] and use [facility] universal password to access. If you do not have the password, please contact the hospital operator.  Locate the [facility] provider you are looking for under [facility] and page to a number that you can be directly reached. If you still have difficulty reaching the provider, please page the [facility] (Director on Call) for the Hospitalists listed on amion for assistance.  01/23/2019, 11:09 AM

## 2019-01-24 LAB — VANCOMYCIN, RANDOM: Vancomycin Rm: 20

## 2019-01-24 LAB — BASIC METABOLIC PANEL
Anion gap: 15 (ref 5–15)
BUN: 70 mg/dL — ABNORMAL HIGH (ref 8–23)
CO2: 30 mmol/L (ref 22–32)
Calcium: 7.7 mg/dL — ABNORMAL LOW (ref 8.9–10.3)
Chloride: 104 mmol/L (ref 98–111)
Creatinine, Ser: 1.24 mg/dL — ABNORMAL HIGH (ref 0.44–1.00)
GFR calc Af Amer: 50 mL/min — ABNORMAL LOW (ref >60)
GFR calc non Af Amer: 43 mL/min — ABNORMAL LOW (ref >60)
GLUCOSE: 121 mg/dL — AB (ref 70–99)
Potassium: 2.3 mmol/L — CL (ref 3.5–5.1)
Sodium: 149 mmol/L — ABNORMAL HIGH (ref 135–145)

## 2019-01-24 LAB — CULTURE, BLOOD (ROUTINE X 2)
SPECIAL REQUESTS: ADEQUATE
Special Requests: ADEQUATE

## 2019-01-24 LAB — GLUCOSE, CAPILLARY
GLUCOSE-CAPILLARY: 134 mg/dL — AB (ref 70–99)
Glucose-Capillary: 108 mg/dL — ABNORMAL HIGH (ref 70–99)
Glucose-Capillary: 116 mg/dL — ABNORMAL HIGH (ref 70–99)
Glucose-Capillary: 130 mg/dL — ABNORMAL HIGH (ref 70–99)

## 2019-01-24 LAB — VANCOMYCIN, PEAK: Vancomycin Pk: 42 ug/mL — ABNORMAL HIGH (ref 30–40)

## 2019-01-24 MED ORDER — POTASSIUM CHLORIDE 10 MEQ/100ML IV SOLN
INTRAVENOUS | Status: AC
  Administered 2019-01-24: 10 meq via INTRAVENOUS
  Filled 2019-01-24: qty 100

## 2019-01-24 MED ORDER — SODIUM CHLORIDE 0.9 % IV SOLN
INTRAVENOUS | Status: DC
  Administered 2019-01-24: 13:00:00 via INTRAVENOUS

## 2019-01-24 MED ORDER — VANCOMYCIN HCL 10 G IV SOLR
1500.0000 mg | INTRAVENOUS | Status: DC
  Administered 2019-01-24: 1500 mg via INTRAVENOUS
  Filled 2019-01-24 (×2): qty 1500

## 2019-01-24 MED ORDER — METOPROLOL TARTRATE 12.5 MG HALF TABLET
12.5000 mg | ORAL_TABLET | Freq: Three times a day (TID) | ORAL | Status: DC
  Administered 2019-01-24 (×2): 12.5 mg via ORAL
  Filled 2019-01-24 (×2): qty 1

## 2019-01-24 MED ORDER — METOPROLOL TARTRATE 5 MG/5ML IV SOLN
5.0000 mg | INTRAVENOUS | Status: DC | PRN
  Administered 2019-01-24 – 2019-01-25 (×3): 5 mg via INTRAVENOUS
  Filled 2019-01-24 (×3): qty 5

## 2019-01-24 MED ORDER — POTASSIUM CHLORIDE 10 MEQ/100ML IV SOLN
10.0000 meq | INTRAVENOUS | Status: AC
  Administered 2019-01-24 (×4): 10 meq via INTRAVENOUS
  Filled 2019-01-24 (×3): qty 100

## 2019-01-24 MED ORDER — GLUCERNA SHAKE PO LIQD
237.0000 mL | Freq: Three times a day (TID) | ORAL | Status: DC
  Administered 2019-01-24 – 2019-01-30 (×3): 237 mL via ORAL
  Filled 2019-01-24 (×2): qty 237

## 2019-01-24 MED ORDER — KCL IN DEXTROSE-NACL 40-5-0.45 MEQ/L-%-% IV SOLN
INTRAVENOUS | Status: DC
  Administered 2019-01-24 – 2019-01-25 (×2): via INTRAVENOUS
  Filled 2019-01-24 (×3): qty 1000

## 2019-01-24 MED ORDER — POTASSIUM CHLORIDE 2 MEQ/ML IV SOLN
INTRAVENOUS | Status: DC
  Filled 2019-01-24: qty 1000

## 2019-01-24 NOTE — Progress Notes (Signed)
Upon assessment pt noted to be confused & only alert to self. Pt asked RN "why am I still living/" nurse asked pt if she would talk to a chaplain?  Per pt she's willing to talk to the chaplain. Pt also having hr ranginging from 1teens-150s non-sustaining with burst of svt. Pt refusing to take all meds or eat. New order for IV lopressor for HR>120 as well as for the chaplain will continue to monitor the pt. Hoover Brunette, RN

## 2019-01-24 NOTE — Clinical Social Work Note (Signed)
Clinical Social Work Assessment  Patient Details  Name: [name] MRN: [id number] Date of Birth: 12/17/1945  Date of referral:  01/24/19               Reason for consult:  Facility Placement                Permission sought to share information with:  Facility Sport and exercise psychologist, Family Supports Permission granted to share information::  No  Name::     Field seismologist::  SNFs  Relationship::  Friend  Contact Information:  [phone]  Housing/Transportation Living arrangements for the past 2 months:  [place] of Information:  Friend/Neighbor Patient Interpreter Needed:  None Criminal Activity/Legal Involvement Pertinent to Current Situation/Hospitalization:  No - Comment as needed Significant Relationships:  Friend Lives with:  Self Do you feel safe going back to the place where you live?  No Need for family participation in patient care:  Yes (Comment)  Care giving concerns:  CSW received consult for possible SNF placement at time of discharge. CSW spoke with patient's friend, Dede. She reports that patient has no family since her husband passed away. Dede gets patient's groceries and takes her to the doctor. Dede is currently physically unable to care for patient at her home given patient's current physical needs and fall risk. Dede expressed understanding of PT recommendation and is agreeable to SNF placement at time of discharge. Patient has been at [facility] for 41 days and was discharged before coming back to the hospital. [facility] unable to accept patient back and patient is in copay days ($170/day). CSW to continue to follow and assist with discharge planning needs.   Social Worker assessment / plan:  CSW spoke with patient's friend concerning possibility of rehab at [facility] before returning home.  Employment status:  Retired Nurse, adult PT Recommendations:  [place] / Referral  to community resources:  Real  Patient/Family's Response to care:  Dede reports that it would not be safe for patient to return home alone, but patient does not have any money to pay for SNF. CSW explained insurance authorization process to see if they would approve patient to go with copays-if not then it would be all out of pocket. CSW will attempt to locate a SNF that is willing to bill copays rather than require it to be paid up front. CSW discussed Medicaid application as well for long term placement.   Patient/Family's Understanding of and Emotional Response to Diagnosis, Current Treatment, and Prognosis:  Patient/family is realistic regarding therapy needs and expressed being hopeful for SNF placement. Patient's freind expressed understanding of CSW role and discharge process as well as medical condition. No questions/concerns about plan or treatment.    Emotional Assessment Appearance:  Appears stated age Attitude/Demeanor/Rapport:  Unable to Assess Affect (typically observed):  Unable to Assess Orientation:  Oriented to Self Alcohol / Substance use:  Not Applicable Psych involvement (Current and /or in the community):  No (Comment)  Discharge Needs  Concerns to be addressed:  Care Coordination Readmission within the last 30 days:  No Current discharge risk:  Cognitively Impaired, Dependent with Mobility, Lives alone, Lack of support system Barriers to Discharge:  Ship broker, Continued Medical Work up, Inadequate or no insurance   [place], LCSW 01/24/2019, 6:05 PM

## 2019-01-24 NOTE — Progress Notes (Signed)
Came back to see patient and she was sleeping.  I did not wake her but had prayer for her due to how sick she was earlier.  She was able to tell me she didn't want to eat because of the nausea at the earlier visit.  [name]    01/24/19 1400  Clinical Encounter Type  Visited With Patient not available  Visit Type Follow-up (patient sleeping comfortably)  Referral From Nurse  Consult/Referral To Chaplain  Spiritual Encounters  Spiritual Needs Prayer

## 2019-01-24 NOTE — Progress Notes (Signed)
Initial visit-patient was very nauseated and miserable.  I was called away and will return later hopefully she will feel better. It was difficult to understand her.  I rested my hand on her arm and she seemed very receptive to a human touch.  She commented that was wonderful. Conard Novak, Chaplain   01/24/19 1300  Clinical Encounter Type  Visited With Patient  Visit Type Initial  Referral From Nurse  Consult/Referral To Chaplain  Spiritual Encounters  Spiritual Needs Emotional

## 2019-01-24 NOTE — Progress Notes (Signed)
Initial Nutrition Assessment  DOCUMENTATION CODES:   Obesity unspecified  INTERVENTION:   - Glucerna Shake po TID, each supplement provides 220 kcal and 10 grams of protein  NUTRITION DIAGNOSIS:   Inadequate oral intake related to nausea as evidenced by per patient/family report, meal completion < 25%.  GOAL:   Patient will meet greater than or equal to 90% of their needs  MONITOR:   PO intake, Supplement acceptance, Labs, Weight trends  REASON FOR ASSESSMENT:   Low [place]    ASSESSMENT:   73 year old female who presented to the ED on 2/24 with hypotension. PMH significant for MI/CAD s/o PCI 2014, CHF, T2DM dependent on insulin, HTN, HLD. Pt recently hospitalized 12/06/18-12/11/18 with acute metabolic encephalopathy d/t UTI. Pt admitted with hypovolemic shock, severe dehydration with hyponatremia, sepsis, and acute renal failure.   Attempted to speak with pt at bedside. However, pt moaning with eyes closed and emesis bag in hand. Pt communicating by nodding or shaking head and did not verbalize during RD visit.  Pt indicates that she does not feel well but does not specify what exactly is wrong. Per Chaplain note, pt has been experiencing nausea today. RD noted untouched lunch meal tray at bedside. Pt shook her head "no" when RD asked if she had eaten anything today. Pt also shook her head "no" when RD asked if pt would try some lunch.  RD to order oral nutrition supplement to aid in meeting kcal and protein needs if PO intake remains poor.  Per weight history in chart, pt with 8.8 kg weight loss over the last 6 weeks. This is an 8% weight loss which is significant for timeframe. Pt is at risk for malnutrition. Will attempt to obtain more diet and weight history at follow-up when pt is more alert and/or family and friends are present.  Meal Completion: 0%  Medications reviewed and include: SSI, IV antibiotics, D5 with KCl @ 75 ml/hr  Labs reviewed: sodium 149 (H), potassium  2.3 (L), BUN 70 (H), creatinine 1.24 (H) CBG's: 108, 134, 152, 152 x 24 hours  UOP: 1000 ml x 24 hours  NUTRITION - FOCUSED PHYSICAL EXAM:    Most Recent Value  Orbital Region  Mild depletion  Upper Arm Region  No depletion  Thoracic and Lumbar Region  No depletion  Buccal Region  No depletion  Temple Region  Mild depletion  Clavicle Bone Region  Mild depletion  Clavicle and Acromion Bone Region  Mild depletion  Scapular Bone Region  Unable to assess  Dorsal Hand  No depletion  Patellar Region  No depletion  Anterior Thigh Region  Mild depletion  Posterior Calf Region  Mild depletion  Edema (RD Assessment)  Moderate [BUE, BLE]  Hair  Reviewed  Eyes  Reviewed  Mouth  Reviewed  Skin  Reviewed  Nails  Reviewed     Suspect muscle wasting in BLE related to lack of ambulation.  Diet Order:   Diet Order            Diet heart healthy/carb modified Room service appropriate? No; Fluid consistency: Thin  Diet effective now              EDUCATION NEEDS:   Not appropriate for education at this time  Skin:  Skin Assessment: Reviewed RN Assessment  Last BM:  2/26  Height:   Ht Readings from Last 1 Encounters:  01/21/19 5' 8.5" (1.74 m)    Weight:   Wt Readings from Last 1 Encounters:  01/24/19 101.2  kg    Ideal Body Weight:  64.8 kg  BMI:  Body mass index is 33.43 kg/m.  Estimated Nutritional Needs:   Kcal:  1600-1800  Protein:  90-105 grams  Fluid:  1.6-1.8 L    Gaynell Face, MS, RD, LDN Inpatient Clinical Dietitian Pager: [phone] Weekend/After Hours: [phone]

## 2019-01-24 NOTE — Progress Notes (Signed)
Pharmacy Antibiotic Note  [name] is a 73 y.o. female admitted on 01/21/2019 with sepsis.  Blood cultures came back with 2/4 bottles with GPC. Blood is now positive with 3/4 bottles with staph epi. She got a vanc load the other day due to ARF. Scr continues to trend down to 1.24. Random vanc level was 20 this AM. Will put her on a schedule dose today and get AUC levels   Plan: Vanc 1.5g IV q24 AUC levels tomorrow Cont Merrem 1g IV q12   Height: 5' 8.5" (174 cm) Weight: 223 lb 1.7 oz (101.2 kg) IBW/kg (Calculated) : 65.05  Temp (24hrs), Avg:98 F (36.7 C), Min:97.8 F (36.6 C), Max:98.3 F (36.8 C)  Recent Labs  Lab 01/21/19 1309 01/21/19 1335 01/21/19 1718 01/21/19 2042 01/22/19 0612 01/23/19 0351 01/23/19 1311 01/24/19 0444  WBC 12.3*  --   --   --  9.8 7.0  --   --   CREATININE 4.89*  --   --  3.99* 3.55* 2.54*  --  1.24*  LATICACIDVEN  --  3.0* 2.5*  --   --   --   --   --   VANCORANDOM  --   --   --   --   --   --  25 20    Estimated Creatinine Clearance: 50 mL/min (A) (by C-G formula based on SCr of 1.24 mg/dL (H)).    Allergies  Allergen Reactions  . Oxycodone Nausea Only    Antimicrobials this admission: 2/24 vancomycin >>  2/24 cefepime >> 2/25 2/27 merrem>> Dose adjustments this admission:   Microbiology results:  2/24 blood>>3/4 MRSE 2/24 MRSA>>neg 2/24 urine>>ESBL proteus  Onnie Boer, PharmD, BCIDP, AAHIVP, CPP Infectious Disease Pharmacist 01/24/2019 2:06 PM

## 2019-01-24 NOTE — NC FL2 (Signed)
[place] LEVEL OF CARE SCREENING TOOL     IDENTIFICATION  Patient Name: [name] Birthdate: September 18, 1946 Sex: female Admission Date (Current Location): 01/21/2019  [facility] and [place] Number:  Herbalist and Address:  [facility], [place] [address], [place], [place] [address]      Provider Number: [id number]  Attending Physician Name and Address:  Jonetta Osgood, MD  Relative Name and Phone Number:  Harden Mo, friend, [phone]    Current Level of Care: Hospital Recommended Level of Care: [place] Prior Approval Number:    Date Approved/Denied:   PASRR Number: [id number]  Discharge Plan: SNF    Current Diagnoses: Patient Active Problem List   Diagnosis Date Noted  . Hypovolemic shock ([place]) 01/21/2019  . Aortic aneurysm ([place]) 12/13/2018  . Neurocognitive deficits 12/13/2018  . Acute metabolic encephalopathy 40/06/6760  . Physical deconditioning 12/11/2018  . Rib pain on left side 12/11/2018  . E. coli UTI (urinary tract infection) 12/11/2018  . Uncontrolled type 2 diabetes mellitus with hyperglycemia ([place]) 12/06/2018  . Adjustment disorder with anxious mood 05/31/2016  . Tibia fracture after fall-s/p intramedulaary nailing 05/26/16 05/26/2016  . 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 Epiretinal membrane (ERM) of both eyes 01/26/2016  . Syncope and collapse 07/18/2015  . Compression fracture 07/15/2015  . Sepsis ([place]) 12/05/2014  . Constipation 12/04/2014  . Hematuria   . Intertrochanteric fracture of left hip ([place]) 11/14/2014  . 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 HTN (hypertension) 11/12/2013  . Fall 11/12/2013  . CAD -S/P PCI Feb 2014 01/11/2013  . Hematochezia 01/08/2013  . PSVT-post op 05/26/16 01/05/2013  . History of NSTEMI Feb 2014 01/05/2013  . Hypokalemia 01/04/2013  . H/O vertebroplasty 01/04/2013  . Proliferative diabetic retinopathy ([place]) 08/15/2012  . Pseudoaphakia 08/15/2012  .  Dehydration 03/31/2012  . Hyponatremia 03/31/2012    Orientation RESPIRATION BLADDER Height & Weight     Self  O2(Nasal cannula 2L) Continent Weight: 101.2 kg Height:  5' 8.5" (174 cm)  BEHAVIORAL SYMPTOMS/MOOD NEUROLOGICAL BOWEL NUTRITION STATUS      Continent Diet(Please see DC Summary)  AMBULATORY STATUS COMMUNICATION OF NEEDS Skin   Extensive Assist Verbally Normal                       Personal Care Assistance Level of Assistance  Bathing, Feeding, Dressing Bathing Assistance: Maximum assistance Feeding assistance: Maximum assistance Dressing Assistance: Maximum assistance     Functional Limitations Info  Speech, Hearing, Sight Sight Info: Adequate Hearing Info: Adequate Speech Info: Adequate    SPECIAL CARE FACTORS FREQUENCY  PT (By licensed PT), OT (By licensed OT)     PT Frequency: 5x/week OT Frequency: 3x/week            Contractures Contractures Info: Not present    Additional Factors Info  Code Status, Allergies, Insulin Sliding Scale, Isolation Precautions Code Status Info: DNR Allergies Info: Oxycodone   Insulin Sliding Scale Info: 3x daily with meals and at bedtime Isolation Precautions Info: ESBL     Current Medications (01/24/2019):  This is the current hospital active medication list Current Facility-Administered Medications  Medication Dose Route Frequency Provider Last Rate Last Dose  . acetaminophen (TYLENOL) tablet 650 mg  650 mg Oral Q6H PRN Hongalgi, Anand D, MD       Or  . acetaminophen (TYLENOL) suppository 650 mg  650 mg Rectal Q6H PRN Modena Jansky, MD      . albuterol (  PROVENTIL) (2.5 MG/3ML) 0.083% nebulizer solution 2.5 mg  2.5 mg Nebulization Q2H PRN Hongalgi, Anand D, MD      . dextrose 5 % and 0.45 % NaCl with KCl 40 mEq/L infusion   Intravenous Continuous Jonetta Osgood, MD 75 mL/hr at 01/24/19 1800    . feeding supplement (GLUCERNA SHAKE) (GLUCERNA SHAKE) liquid 237 mL  237 mL Oral TID BM Ghimire, Shanker M, MD       . heparin injection 5,000 Units  5,000 Units Subcutaneous Q8H Modena Jansky, MD   5,000 Units at 01/24/19 1330  . insulin aspart (novoLOG) injection 0-5 Units  0-5 Units Subcutaneous QHS Hongalgi, Anand D, MD      . insulin aspart (novoLOG) injection 0-9 Units  0-9 Units Subcutaneous TID WC Modena Jansky, MD   1 Units at 01/24/19 0845  . meropenem (MERREM) 1 g in sodium chloride 0.9 % 100 mL IVPB  1 g Intravenous Q12H Erenest Blank, RPH   Stopped at 01/24/19 1007  . metoprolol tartrate (LOPRESSOR) injection 5 mg  5 mg Intravenous Q4H PRN Jonetta Osgood, MD   5 mg at 01/24/19 1127  . metoprolol tartrate (LOPRESSOR) tablet 12.5 mg  12.5 mg Oral TID Jonetta Osgood, MD   12.5 mg at 01/24/19 1600  . ondansetron (ZOFRAN) injection 4 mg  4 mg Intravenous Q6H PRN Jonetta Osgood, MD   4 mg at 01/24/19 1135  . senna-docusate (Senokot-S) tablet 1 tablet  1 tablet Oral QHS PRN Hongalgi, Anand D, MD      . sodium chloride flush (NS) 0.9 % injection 3 mL  3 mL Intravenous Q12H Hongalgi, Lenis [place], MD   3 mL at 01/23/19 1025  . vancomycin (VANCOCIN) 1,500 mg in sodium chloride 0.9 % 500 mL IVPB  1,500 mg Intravenous Q24H Jonetta Osgood, MD 250 mL/hr at 01/24/19 1800       Discharge Medications: Please see discharge summary for a list of discharge medications.  Relevant Imaging Results:  Relevant Lab Results:   Additional Information SSN: [id number]  Benard Halsted, LCSW

## 2019-01-24 NOTE — Progress Notes (Signed)
PROGRESS NOTE        PATIENT DETAILS Name: [name] Age: 73 y.o. Sex: female Date of Birth: Apr 25, 1946 Admit Date: 01/21/2019 Admitting Physician Modena Jansky, MD [id number], [name], MD  Brief Narrative: Patient is a 73 y.o. female with prior history of CAD status post PCI in 5465, chronic diastolic heart failure, DM-2, hypertension, dyslipidemia presenting with acute metabolic encephalopathy secondary to acute kidney injury and sepsis with hypotension from gram-positive bacteremia.  See below for further details  Subjective: Still confused but more alert compared to yesterday.  Able to tell me her name-did recognize me today.  Assessment/Plan: Sepsis with hypotension secondary to staph epidermidis bacteremia and ESBL Proteus UTI: 3/4 bottles positive for staph epidermidis, urine culture positive for ESBL Proteus.  Sepsis pathophysiology has i improved with IV fluids and empiric antimicrobial therapy.  Repeat blood cultures on 2/26- so far.  Most recent echo on 12/08/2018 without any vegetations-if cultures are persistently positive then we can pursue TEE/repeat TTE.  ID following.  Acute renal failure: Suspect this is hemodynamically related-in the setting of hypotension-and the use of diuretics, ACEI, NSAIDs.  Renal function has improved-still with very poor oral intake-hence will change IV fluids to normal saline from D5W with bicarb.  Anion gap metabolic acidosis: Secondary to above-acidosis is definitely improved with initiation of D5W with bicarbonate improvement in renal function.  Follow  Hypokalemia: Stop D5W with bicarb-start gentle hydration with normal saline.  Replete potassium-recheck tomorrow.  Hyponatremia: Resolved-suspect isotonic hypovolemic hyponatremia.  Follow electrolytes periodically  Acute metabolic encephalopathy: Secondary to acute renal failure and sepsis.  Nonfocal exam-slowly improving-today able to tell me her name,  recognized me.  Hopeful that with improvement in sepsis pathophysiology and renal function-her mentation will continue to improve.Marland Kitchen    CAD s/p PCI 2014: Supportive care-no obvious anginal symptoms present.  Hypertension: Blood pressure controlled low-dose metoprolol  Runs of narrow complex tachycardia/SVT: Continue low-dose metoprolol.  Chronic diastolic heart failure: Volume status stable-monitor closely while on IV fluids.  Continue to hold diuretics for now.  Insulin-dependent DM-2: CBG stable-continue SSI.  Continue to hold oral hypoglycemic agents and Actos for now.  Large multinodular goiter: Stable for outpatient follow-up-this is a chronic issue.  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  DVT Prophylaxis: Prophylactic Heparin   Code Status: DNR  Family Communication: None at bedside  Disposition Plan: Remain inpatient  Antimicrobial agents: Anti-infectives (From admission, onward)   Start     Dose/Rate Route Frequency Ordered Stop   01/23/19 2330  meropenem (MERREM) 1 g in sodium chloride 0.9 % 100 mL IVPB     1 g 200 mL/hr over 30 Minutes Intravenous Every 12 hours 01/23/19 2321     01/22/19 1200  ceFEPIme (MAXIPIME) 1 g in sodium chloride 0.9 % 100 mL IVPB  Status:  Discontinued     1 g 200 mL/hr over 30 Minutes Intravenous Every 24 hours 01/21/19 1446 01/22/19 0956   01/21/19 1446  vancomycin variable dose per unstable renal function (pharmacist dosing)  Status:  Discontinued      Does not apply See admin instructions 01/21/19 1446 01/22/19 0941   01/21/19 1415  ceFEPIme (MAXIPIME) 2 g in sodium chloride 0.9 % 100 mL IVPB     2 g 200 mL/hr over 30 Minutes Intravenous  Once 01/21/19 1403 01/21/19 1449  01/21/19 1415  vancomycin (VANCOCIN) IVPB 1000 mg/200 mL premix  Status:  Discontinued     1,000 mg 200 mL/hr over 60 Minutes Intravenous  Once 01/21/19 1403 01/21/19 1411   01/21/19 1415  vancomycin (VANCOCIN)  2,500 mg in sodium chloride 0.9 % 500 mL IVPB     2,500 mg 250 mL/hr over 120 Minutes Intravenous  Once 01/21/19 1411 01/21/19 1937      Procedures: None  CONSULTS:  None  Time spent: 25 minutes-Greater than 50% of this time was spent in counseling, explanation of diagnosis, planning of further management, and coordination of care.  MEDICATIONS: Scheduled Meds: . heparin  5,000 Units Subcutaneous Q8H  . insulin aspart  0-5 Units Subcutaneous QHS  . insulin aspart  0-9 Units Subcutaneous TID WC  . metoprolol tartrate  12.5 mg Oral BID  . sodium chloride flush  3 mL Intravenous Q12H   Continuous Infusions: . sodium chloride    . meropenem (MERREM) IV 1 g (01/24/19 0935)   PRN Meds:.acetaminophen **OR** acetaminophen, albuterol, metoprolol tartrate, ondansetron (ZOFRAN) IV, senna-docusate   PHYSICAL EXAM: Vital signs: Vitals:   01/23/19 1953 01/24/19 0448 01/24/19 0603 01/24/19 0938  BP: 102/69  113/66 106/90  Pulse:  (!) 123 (!) 130 (!) 124  Resp: 19 (!) 22 20 18   Temp: 98 F (36.7 C)  98.3 F (36.8 C) 97.8 F (36.6 C)  TempSrc: Oral  Axillary Oral  SpO2:  100% 97% 98%  Weight:  101.2 kg    Height:       Filed Weights   01/22/19 1817 01/23/19 0648 01/24/19 0448  Weight: 100.6 kg 101.6 kg 101.2 kg   Body mass index is 33.43 kg/m.   General appearance: Still confused but able to tell me her name today.  Not in any distress. Eyes:no scleral icterus. HEENT: Atraumatic and Normocephalic Neck: supple, no JVD. Resp:Good air entry bilaterally, no added sounds heard anteriorly CVS: S1 S2 regular GI: Bowel sounds present, Non tender and not distended with no gaurding, rigidity or rebound. Extremities: B/L Lower Ext shows no edema, both legs are warm to touch Neurology: Moves all 4 extremities Musculoskeletal:No digital cyanosis Skin:No Rash, warm and dry Wounds:N/A  I have personally reviewed following labs and imaging studies  LABORATORY DATA: CBC: Recent  Labs  Lab 01/21/19 1309 01/22/19 0612 01/23/19 0351  WBC 12.3* 9.8 7.0  NEUTROABS 10.0*  --   --   HGB 13.8 12.9 13.1  HCT 42.3 40.0 37.3  MCV 91.4 93.2 87.6  PLT 180 149* 104*    Basic Metabolic Panel: Recent Labs  Lab 01/21/19 1309 01/21/19 2042 01/22/19 0612 01/23/19 0351 01/24/19 0444  NA 129* 135 138 140 149*  K 4.9 4.8 4.2 3.8 2.3*  CL 95* 106 107 106 104  CO2 15* 13* 14* 15* 30  GLUCOSE 216* 120* 80 170* 121*  BUN 139* 124* 122* 110* 70*  CREATININE 4.89* 3.99* 3.55* 2.54* 1.24*  CALCIUM 8.7* 7.9* 7.6* 7.5* 7.7*    GFR: Estimated Creatinine Clearance: 50 mL/min (A) (by C-G formula based on SCr of 1.24 mg/dL (H)).  Liver Function Tests: Recent Labs  Lab 01/21/19 1309 01/23/19 0351  AST 23 18  ALT 33 QUANTITY NOT SUFFICIENT, UNABLE TO PERFORM TEST  ALKPHOS 59 45  BILITOT 0.9 QUANTITY NOT SUFFICIENT, UNABLE TO PERFORM TEST  PROT 6.7 5.5*  ALBUMIN 2.2* 1.8*   No results for input(s): LIPASE, AMYLASE in the last 168 hours. No results for input(s): AMMONIA in the last  168 hours.  Coagulation Profile: No results for input(s): INR, PROTIME in the last 168 hours.  Cardiac Enzymes: No results for input(s): CKTOTAL, CKMB, CKMBINDEX, TROPONINI in the last 168 hours.  BNP (last 3 results) No results for input(s): PROBNP in the last 8760 hours.  HbA1C: No results for input(s): HGBA1C in the last 72 hours.  CBG: Recent Labs  Lab 01/23/19 1220 01/23/19 1650 01/23/19 2305 01/24/19 0804 01/24/19 1141  GLUCAP 144* 152* 152* 134* 108*    Lipid Profile: No results for input(s): CHOL, HDL, LDLCALC, TRIG, CHOLHDL, LDLDIRECT in the last 72 hours.  Thyroid Function Tests: No results for input(s): TSH, T4TOTAL, FREET4, T3FREE, THYROIDAB in the last 72 hours.  Anemia Panel: No results for input(s): VITAMINB12, FOLATE, FERRITIN, TIBC, IRON, RETICCTPCT in the last 72 hours.  Urine analysis:    Component Value Date/Time   COLORURINE YELLOW 01/21/2019 1620     APPEARANCEUR TURBID (A) 01/21/2019 1620   LABSPEC 1.020 01/21/2019 1620   PHURINE 8.0 01/21/2019 1620   GLUCOSEU NEGATIVE 01/21/2019 1620   HGBUR MODERATE (A) 01/21/2019 1620   BILIRUBINUR NEGATIVE 01/21/2019 1620   KETONESUR NEGATIVE 01/21/2019 1620   PROTEINUR 100 (A) 01/21/2019 1620   UROBILINOGEN 1.0 06/28/2015 1552   NITRITE NEGATIVE 01/21/2019 1620   LEUKOCYTESUR LARGE (A) 01/21/2019 1620    Sepsis Labs: Lactic Acid, Venous    Component Value Date/Time   LATICACIDVEN 2.5 (HH) 01/21/2019 1718    MICROBIOLOGY: Recent Results (from the past 240 hour(s))  Blood Culture (routine x 2)     Status: Abnormal   Collection Time: 01/21/19  1:35 PM  Result Value Ref Range Status   Specimen Description BLOOD RIGHT ANTECUBITAL  Final   Special Requests   Final    BOTTLES DRAWN AEROBIC AND ANAEROBIC Blood Culture adequate volume   Culture  Setup Time   Final    GRAM POSITIVE COCCI IN BOTH AEROBIC AND ANAEROBIC BOTTLES CRITICAL RESULT CALLED TO, READ BACK BY AND VERIFIED WITH: Karlene Einstein PharmD 11:00 01/22/19 ([email or web address]) Performed at [facility], [place] [address] [place], [place] [address]    Culture STAPHYLOCOCCUS EPIDERMIDIS (A)  Final   Report Status 01/24/2019 FINAL  Final   Organism ID, Bacteria STAPHYLOCOCCUS EPIDERMIDIS  Final      Susceptibility   Staphylococcus epidermidis - MIC*    CIPROFLOXACIN >=8 RESISTANT Resistant     ERYTHROMYCIN >=8 RESISTANT Resistant     GENTAMICIN <=0.5 SENSITIVE Sensitive     OXACILLIN >=4 RESISTANT Resistant     TETRACYCLINE 2 SENSITIVE Sensitive     VANCOMYCIN 1 SENSITIVE Sensitive     TRIMETH/SULFA 80 RESISTANT Resistant     CLINDAMYCIN >=8 RESISTANT Resistant     RIFAMPIN <=0.5 SENSITIVE Sensitive     Inducible Clindamycin NEGATIVE Sensitive     * STAPHYLOCOCCUS EPIDERMIDIS  Blood Culture (routine x 2)     Status: Abnormal   Collection Time: 01/21/19  1:35 PM  Result Value Ref Range Status   Specimen Description BLOOD RIGHT  ANTECUBITAL  Final   Special Requests   Final    BOTTLES DRAWN AEROBIC AND ANAEROBIC Blood Culture adequate volume   Culture  Setup Time   Final    GRAM POSITIVE COCCI ANAEROBIC BOTTLE ONLY CRITICAL VALUE NOTED.  VALUE IS CONSISTENT WITH PREVIOUSLY REPORTED AND CALLED VALUE.    Culture (A)  Final    STAPHYLOCOCCUS EPIDERMIDIS SUSCEPTIBILITIES PERFORMED ON PREVIOUS CULTURE WITHIN THE LAST 5 DAYS. Performed at [facility], [address] [place], [place] [address]    Report Status 01/24/2019 FINAL  Final  Blood Culture ID Panel (Reflexed)     Status: Abnormal   Collection Time: 01/21/19  1:35 PM  Result Value Ref Range Status   Enterococcus species NOT DETECTED NOT DETECTED Final   Listeria monocytogenes NOT DETECTED NOT DETECTED Final   Staphylococcus species DETECTED (A) NOT DETECTED Final    Comment: Methicillin (oxacillin) resistant coagulase negative staphylococcus. Possible blood culture contaminant (unless isolated from more than one blood culture draw or clinical case suggests pathogenicity). No antibiotic treatment is indicated for blood  culture contaminants. CRITICAL RESULT CALLED TO, READ BACK BY AND VERIFIED WITH: Karlene Einstein PharmD 11:00 01/22/19 (wilsonm)    Staphylococcus aureus (BCID) NOT DETECTED NOT DETECTED Final   Methicillin resistance DETECTED (A) NOT DETECTED Final    Comment: CRITICAL RESULT CALLED TO, READ BACK BY AND VERIFIED WITH: Karlene Einstein PharmD 11:00 01/22/19 (wilsonm)    Streptococcus species NOT DETECTED NOT DETECTED Final   Streptococcus agalactiae NOT DETECTED NOT DETECTED Final   Streptococcus pneumoniae NOT DETECTED NOT DETECTED Final   Streptococcus pyogenes NOT DETECTED NOT DETECTED Final   Acinetobacter baumannii NOT DETECTED NOT DETECTED Final   Enterobacteriaceae species NOT DETECTED NOT DETECTED Final   Enterobacter cloacae complex NOT DETECTED NOT DETECTED Final   Escherichia coli NOT DETECTED NOT DETECTED Final   Klebsiella  oxytoca NOT DETECTED NOT DETECTED Final   Klebsiella pneumoniae NOT DETECTED NOT DETECTED Final   Proteus species NOT DETECTED NOT DETECTED Final   Serratia marcescens NOT DETECTED NOT DETECTED Final   Haemophilus influenzae NOT DETECTED NOT DETECTED Final   Neisseria meningitidis NOT DETECTED NOT DETECTED Final   Pseudomonas aeruginosa NOT DETECTED NOT DETECTED Final   Candida albicans NOT DETECTED NOT DETECTED Final   Candida glabrata NOT DETECTED NOT DETECTED Final   Candida krusei NOT DETECTED NOT DETECTED Final   Candida parapsilosis NOT DETECTED NOT DETECTED Final   Candida tropicalis NOT DETECTED NOT DETECTED Final    Comment: Performed at [facility], [place]. [address] [place], [place] [address]  Urine culture     Status: Abnormal   Collection Time: 01/21/19  4:20 PM  Result Value Ref Range Status   Specimen Description URINE, RANDOM  Final   Special Requests NONE  Final   Culture (A)  Final    >=100,000 COLONIES/mL PROTEUS MIRABILIS Susceptibility Pattern Suggests Possibility of an Extended Spectrum Beta Lactamase Producer. Contact Laboratory Within 7 Days if Confirmation Warranted. Performed at [facility], [place] [address] [place], [place] [address]    Report Status 01/23/2019 FINAL  Final   Organism ID, Bacteria PROTEUS MIRABILIS (A)  Final      Susceptibility   Proteus mirabilis - MIC*    AMPICILLIN >=32 RESISTANT Resistant     CEFAZOLIN >=64 RESISTANT Resistant     CEFTRIAXONE RESISTANT Resistant     CIPROFLOXACIN >=4 RESISTANT Resistant     GENTAMICIN <=1 SENSITIVE Sensitive     IMIPENEM 2 SENSITIVE Sensitive     NITROFURANTOIN 128 RESISTANT Resistant     TRIMETH/SULFA >=320 RESISTANT Resistant     AMPICILLIN/SULBACTAM >=32 RESISTANT Resistant     PIP/TAZO <=4 SENSITIVE Sensitive     * >=100,000 COLONIES/mL PROTEUS MIRABILIS  MRSA PCR Screening     Status: None   Collection Time: 01/21/19  8:35 PM  Result Value Ref Range Status   MRSA by PCR NEGATIVE  NEGATIVE Final    Comment:  The GeneXpert MRSA Assay (FDA approved for NASAL specimens only), is one component of a comprehensive MRSA colonization surveillance program. It is not intended to diagnose MRSA infection nor to guide or monitor treatment for MRSA infections. Performed at [facility], [place] [address] [place], [place] [address]   Culture, blood (routine x 2)     Status: None (Preliminary result)   Collection Time: 01/23/19 11:31 AM  Result Value Ref Range Status   Specimen Description BLOOD RIGHT THUMB  Final   Special Requests   Final    BOTTLES DRAWN AEROBIC ONLY Blood Culture adequate volume   Culture   Final    NO GROWTH 1 DAY Performed at [facility], [place] [address] [place], [place] [address]    Report Status PENDING  Incomplete  Culture, blood (routine x 2)     Status: None (Preliminary result)   Collection Time: 01/23/19 11:37 AM  Result Value Ref Range Status   Specimen Description BLOOD LEFT HAND  Final   Special Requests   Final    BOTTLES DRAWN AEROBIC ONLY Blood Culture results may not be optimal due to an inadequate volume of blood received in culture bottles   Culture   Final    NO GROWTH 1 DAY Performed at [facility], [place] [address], [place], [place] [address]    Report Status PENDING  Incomplete    RADIOLOGY STUDIES/RESULTS: Dg Chest Port 1 View  Result Date: 01/21/2019 CLINICAL DATA:  Altered mental status/nonresponsive EXAM: PORTABLE CHEST 1 VIEW COMPARISON:  Chest radiograph December 06, 2018 and chest CT December 07, 2018 FINDINGS: There is no appreciable edema or consolidation. Heart size and pulmonary vascularity are normal. No adenopathy. Patient is undergone previous mid lower thoracic kyphoplasty procedures. There is aortic atherosclerosis. Bones are osteoporotic. IMPRESSION: No edema or consolidation.  Stable cardiac silhouette. Aortic Atherosclerosis (ICD10-I70.0). Electronically Signed   By: Lowella Grip III  M.D.   On: 01/21/2019 13:36     LOS: 3 days   Oren Binet, MD  [facility]  If 7PM-7AM, please contact night-coverage  Please page via [email or web address]  Go to [email or web address] and use [place]'s universal password to access. If you do not have the password, please contact the hospital operator.  Locate the [facility] provider you are looking for under [facility] and page to a number that you can be directly reached. If you still have difficulty reaching the provider, please page the [facility] (Director on Call) for the Hospitalists listed on amion for assistance.  01/24/2019, 1:50 PM

## 2019-01-24 NOTE — Plan of Care (Signed)
  Problem: Clinical Measurements: Goal: Will remain free from infection Outcome: Progressing Goal: Respiratory complications will improve Outcome: Progressing   Problem: Pain Managment: Goal: General experience of comfort will improve Outcome: Progressing   Problem: Safety: Goal: Ability to remain free from injury will improve Outcome: Progressing   

## 2019-01-25 DIAGNOSIS — B957 Other staphylococcus as the cause of diseases classified elsewhere: Secondary | ICD-10-CM

## 2019-01-25 DIAGNOSIS — R5381 Other malaise: Secondary | ICD-10-CM

## 2019-01-25 DIAGNOSIS — N390 Urinary tract infection, site not specified: Secondary | ICD-10-CM

## 2019-01-25 DIAGNOSIS — R7881 Bacteremia: Secondary | ICD-10-CM

## 2019-01-25 DIAGNOSIS — B964 Proteus (mirabilis) (morganii) as the cause of diseases classified elsewhere: Secondary | ICD-10-CM

## 2019-01-25 DIAGNOSIS — Z1612 Extended spectrum beta lactamase (ESBL) resistance: Secondary | ICD-10-CM

## 2019-01-25 LAB — BASIC METABOLIC PANEL
Anion gap: 11 (ref 5–15)
BUN: 48 mg/dL — ABNORMAL HIGH (ref 8–23)
CO2: 31 mmol/L (ref 22–32)
Calcium: 7.7 mg/dL — ABNORMAL LOW (ref 8.9–10.3)
Chloride: 107 mmol/L (ref 98–111)
Creatinine, Ser: 1.03 mg/dL — ABNORMAL HIGH (ref 0.44–1.00)
GFR calc Af Amer: >60 mL/min (ref >60)
GFR calc non Af Amer: 54 mL/min — ABNORMAL LOW (ref >60)
Glucose, Bld: 156 mg/dL — ABNORMAL HIGH (ref 70–99)
Potassium: 2.8 mmol/L — ABNORMAL LOW (ref 3.5–5.1)
Sodium: 149 mmol/L — ABNORMAL HIGH (ref 135–145)

## 2019-01-25 LAB — GLUCOSE, CAPILLARY
GLUCOSE-CAPILLARY: 149 mg/dL — AB (ref 70–99)
GLUCOSE-CAPILLARY: 96 mg/dL (ref 70–99)
Glucose-Capillary: 156 mg/dL — ABNORMAL HIGH (ref 70–99)
Glucose-Capillary: 125 mg/dL — ABNORMAL HIGH (ref 70–99)

## 2019-01-25 LAB — VANCOMYCIN, RANDOM: Vancomycin Rm: 32

## 2019-01-25 LAB — MAGNESIUM: Magnesium: 1.4 mg/dL — ABNORMAL LOW (ref 1.7–2.4)

## 2019-01-25 MED ORDER — MAGNESIUM SULFATE 4 GM/100ML IV SOLN
4.0000 g | Freq: Once | INTRAVENOUS | Status: AC
  Administered 2019-01-25: 4 g via INTRAVENOUS
  Filled 2019-01-25: qty 100

## 2019-01-25 MED ORDER — POTASSIUM CHLORIDE 2 MEQ/ML IV SOLN
INTRAVENOUS | Status: DC
  Administered 2019-01-25 – 2019-01-26 (×2): via INTRAVENOUS
  Filled 2019-01-25 (×3): qty 1000

## 2019-01-25 MED ORDER — VANCOMYCIN HCL IN DEXTROSE 1-5 GM/200ML-% IV SOLN
1000.0000 mg | INTRAVENOUS | Status: DC
  Administered 2019-01-25 – 2019-01-30 (×6): 1000 mg via INTRAVENOUS
  Filled 2019-01-25 (×6): qty 200

## 2019-01-25 MED ORDER — TAMSULOSIN HCL 0.4 MG PO CAPS
0.4000 mg | ORAL_CAPSULE | Freq: Every day | ORAL | Status: DC
  Administered 2019-01-25 – 2019-02-08 (×15): 0.4 mg via ORAL
  Filled 2019-01-25 (×15): qty 1

## 2019-01-25 MED ORDER — POTASSIUM CHLORIDE 10 MEQ/100ML IV SOLN
10.0000 meq | INTRAVENOUS | Status: AC
  Administered 2019-01-25 (×5): 10 meq via INTRAVENOUS
  Filled 2019-01-25 (×3): qty 100

## 2019-01-25 MED ORDER — SODIUM CHLORIDE 0.9 % IV SOLN
1.0000 g | Freq: Three times a day (TID) | INTRAVENOUS | Status: AC
  Administered 2019-01-25 – 2019-01-28 (×10): 1 g via INTRAVENOUS
  Filled 2019-01-25 (×10): qty 1

## 2019-01-25 MED ORDER — METOPROLOL TARTRATE 5 MG/5ML IV SOLN
5.0000 mg | Freq: Four times a day (QID) | INTRAVENOUS | Status: DC
  Administered 2019-01-25 – 2019-01-28 (×11): 5 mg via INTRAVENOUS
  Filled 2019-01-25 (×10): qty 5

## 2019-01-25 NOTE — Progress Notes (Signed)
PROGRESS NOTE        PATIENT DETAILS Name: [name] Age: 73 y.o. Sex: female Date of Birth: 1946-10-30 Admit Date: 01/21/2019 Admitting Physician Modena Jansky, MD EHU:[id number], [name], MD  Brief Narrative: Patient is a 73 y.o. female with prior history of CAD status post PCI in 2637, chronic diastolic heart failure, DM-2, hypertension, dyslipidemia presenting with acute metabolic encephalopathy secondary to acute kidney injury and sepsis with hypotension from gram-positive bacteremia.  See below for further details  Subjective: Appears less confused compared to the past few days.  Able to tell me her name, squeeze my fingers when I asked her.  Assessment/Plan: Sepsis with hypotension secondary to staph epidermidis bacteremia and ESBL Proteus UTI: 3/4 bottles positive for staph epidermidis, urine culture positive for ESBL Proteus.  Abscess pathophysiology has markedly improved with IV vancomycin and IV Primaxin.  Repeat blood cultures on 2/26 negative so far.  Most recent echo on 12/08/2018 without any vegetations.  Since she has improved-and repeat blood cultures are negative-doubt any further work-up is required.  ID following.    Acute renal failure: Hemodynamically mediated in the setting of hypotension and use of diuretics, ACEI, NSAIDs.  Renal function has improved and has essentially normalized.    Anion gap metabolic acidosis: Secondary to above-acidosis is definitely improved with initiation of D5W with bicarbonate improvement in renal function.  Follow  Acute metabolic encephalopathy: Secondary to acute renal failure and sepsis.  Nonfocal exam-has improved-she is much more awake and alert compared to the past few days  Hypokalemia: Continue to replete potassium-replace magnesium.  Recheck tomorrow.    Hypernatremia: Secondary to poor oral intake-change IV fluids to hypotonic solution.  Follow.  Hyponatremia: Resolved  CAD s/p PCI 2014:  Supportive care-no obvious anginal symptoms present.  Hypertension: Blood pressure controlled low-dose metoprolol  Runs of narrow complex tachycardia/SVT: Continue low-dose metoprolol.  Chronic diastolic heart failure: Volume status stable-monitor closely while on IV fluids.  Continue to hold diuretics for now.  Insulin-dependent DM-2: CBG stable-continue SSI.  Continue to hold oral hypoglycemic agents and Actos for now.  Large multinodular goiter: Stable for outpatient follow-up-this is a chronic issue.  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  Deconditioning/concern for dysphagia: Await SLP eval-PT eval ongoing-recommendations are for SNF.  DVT Prophylaxis: Prophylactic Heparin   Code Status: DNR  Family Communication: None at bedside  Disposition Plan: Remain inpatient SNF on discharge-likely next week  Antimicrobial agents: Anti-infectives (From admission, onward)   Start     Dose/Rate Route Frequency Ordered Stop   01/25/19 1500  vancomycin (VANCOCIN) IVPB 1000 mg/200 mL premix     1,000 mg 200 mL/hr over 60 Minutes Intravenous Every 24 hours 01/25/19 0947     01/25/19 1400  meropenem (MERREM) 1 g in sodium chloride 0.9 % 100 mL IVPB     1 g 200 mL/hr over 30 Minutes Intravenous Every 8 hours 01/25/19 0947 01/28/19 2159   01/24/19 1500  vancomycin (VANCOCIN) 1,500 mg in sodium chloride 0.9 % 500 mL IVPB  Status:  Discontinued     1,500 mg 250 mL/hr over 120 Minutes Intravenous Every 24 hours 01/24/19 1403 01/25/19 0947   01/23/19 2330  meropenem (MERREM) 1 g in sodium chloride 0.9 % 100 mL IVPB  Status:  Discontinued     1 g 200 mL/hr over 30  Minutes Intravenous Every 12 hours 01/23/19 2321 01/25/19 0947   01/22/19 1200  ceFEPIme (MAXIPIME) 1 g in sodium chloride 0.9 % 100 mL IVPB  Status:  Discontinued     1 g 200 mL/hr over 30 Minutes Intravenous Every 24 hours 01/21/19 1446 01/22/19 0956   01/21/19 1446   vancomycin variable dose per unstable renal function (pharmacist dosing)  Status:  Discontinued      Does not apply See admin instructions 01/21/19 1446 01/22/19 0941   01/21/19 1415  ceFEPIme (MAXIPIME) 2 g in sodium chloride 0.9 % 100 mL IVPB     2 g 200 mL/hr over 30 Minutes Intravenous  Once 01/21/19 1403 01/21/19 1449   01/21/19 1415  vancomycin (VANCOCIN) IVPB 1000 mg/200 mL premix  Status:  Discontinued     1,000 mg 200 mL/hr over 60 Minutes Intravenous  Once 01/21/19 1403 01/21/19 1411   01/21/19 1415  vancomycin (VANCOCIN) 2,500 mg in sodium chloride 0.9 % 500 mL IVPB     2,500 mg 250 mL/hr over 120 Minutes Intravenous  Once 01/21/19 1411 01/21/19 1937      Procedures: None  CONSULTS:  None  Time spent: 25 minutes-Greater than 50% of this time was spent in counseling, explanation of diagnosis, planning of further management, and coordination of care.  MEDICATIONS: Scheduled Meds: . feeding supplement (GLUCERNA SHAKE)  237 mL Oral TID BM  . heparin  5,000 Units Subcutaneous Q8H  . insulin aspart  0-5 Units Subcutaneous QHS  . insulin aspart  0-9 Units Subcutaneous TID WC  . metoprolol tartrate  5 mg Intravenous Q6H  . sodium chloride flush  3 mL Intravenous Q12H   Continuous Infusions: . dextrose 5 % 1,000 mL with potassium chloride 40 mEq infusion 50 mL/hr at 01/25/19 1012  . meropenem (MERREM) IV    . vancomycin     PRN Meds:.acetaminophen **OR** acetaminophen, albuterol, ondansetron (ZOFRAN) IV, senna-docusate   PHYSICAL EXAM: Vital signs: Vitals:   01/25/19 0056 01/25/19 0500 01/25/19 0551 01/25/19 0802  BP:   123/83   Pulse:   (!) 106   Resp:   17   Temp:  97.7 F (36.5 C)  98.6 F (37 C)  TempSrc:  Oral  Oral  SpO2:   99%   Weight: 101.6 kg  101.7 kg   Height:       Filed Weights   01/24/19 0448 01/25/19 0056 01/25/19 0551  Weight: 101.2 kg 101.6 kg 101.7 kg   Body mass index is 33.59 kg/m.   General appearance:Awake, alert, not in any  distress.  Eyes:no scleral icterus. HEENT: Atraumatic and Normocephalic Neck: supple, no JVD. Resp:Good air entry bilaterally,no rales or rhonchi CVS: S1 S2 regular, no murmurs.  GI: Bowel sounds present, Non tender and not distended with no gaurding, rigidity or rebound. Neurology:  Non focal-but with generalized weakness Musculoskeletal:No digital cyanosis Skin:No Rash, warm and dry Wounds:N/A  I have personally reviewed following labs and imaging studies  LABORATORY DATA: CBC: Recent Labs  Lab 01/21/19 1309 01/22/19 0612 01/23/19 0351  WBC 12.3* 9.8 7.0  NEUTROABS 10.0*  --   --   HGB 13.8 12.9 13.1  HCT 42.3 40.0 37.3  MCV 91.4 93.2 87.6  PLT 180 149* 104*    Basic Metabolic Panel: Recent Labs  Lab 01/21/19 2042 01/22/19 0612 01/23/19 0351 01/24/19 0444 01/25/19 0439  NA 135 138 140 149* 149*  K 4.8 4.2 3.8 2.3* 2.8*  CL 106 107 106 104 107  CO2 13* 14*  15* 30 31  GLUCOSE 120* 80 170* 121* 156*  BUN 124* 122* 110* 70* 48*  CREATININE 3.99* 3.55* 2.54* 1.24* 1.03*  CALCIUM 7.9* 7.6* 7.5* 7.7* 7.7*  MG  --   --   --   --  1.4*    GFR: Estimated Creatinine Clearance: 60.3 mL/min (A) (by C-G formula based on SCr of 1.03 mg/dL (H)).  Liver Function Tests: Recent Labs  Lab 01/21/19 1309 01/23/19 0351  AST 23 18  ALT 33 QUANTITY NOT SUFFICIENT, UNABLE TO PERFORM TEST  ALKPHOS 59 45  BILITOT 0.9 QUANTITY NOT SUFFICIENT, UNABLE TO PERFORM TEST  PROT 6.7 5.5*  ALBUMIN 2.2* 1.8*   No results for input(s): LIPASE, AMYLASE in the last 168 hours. No results for input(s): AMMONIA in the last 168 hours.  Coagulation Profile: No results for input(s): INR, PROTIME in the last 168 hours.  Cardiac Enzymes: No results for input(s): CKTOTAL, CKMB, CKMBINDEX, TROPONINI in the last 168 hours.  BNP (last 3 results) No results for input(s): PROBNP in the last 8760 hours.  HbA1C: No results for input(s): HGBA1C in the last 72 hours.  CBG: Recent Labs  Lab  01/24/19 1141 01/24/19 1634 01/24/19 2207 01/25/19 0820 01/25/19 1303  GLUCAP 108* 116* 130* 149* 156*    Lipid Profile: No results for input(s): CHOL, HDL, LDLCALC, TRIG, CHOLHDL, LDLDIRECT in the last 72 hours.  Thyroid Function Tests: No results for input(s): TSH, T4TOTAL, FREET4, T3FREE, THYROIDAB in the last 72 hours.  Anemia Panel: No results for input(s): VITAMINB12, FOLATE, FERRITIN, TIBC, IRON, RETICCTPCT in the last 72 hours.  Urine analysis:    Component Value Date/Time   COLORURINE YELLOW 01/21/2019 1620   APPEARANCEUR TURBID (A) 01/21/2019 1620   LABSPEC 1.020 01/21/2019 1620   PHURINE 8.0 01/21/2019 1620   GLUCOSEU NEGATIVE 01/21/2019 1620   HGBUR MODERATE (A) 01/21/2019 1620   BILIRUBINUR NEGATIVE 01/21/2019 1620   KETONESUR NEGATIVE 01/21/2019 1620   PROTEINUR 100 (A) 01/21/2019 1620   UROBILINOGEN 1.0 06/28/2015 1552   NITRITE NEGATIVE 01/21/2019 1620   LEUKOCYTESUR LARGE (A) 01/21/2019 1620    Sepsis Labs: Lactic Acid, Venous    Component Value Date/Time   LATICACIDVEN 2.5 (HH) 01/21/2019 1718    MICROBIOLOGY: Recent Results (from the past 240 hour(s))  Blood Culture (routine x 2)     Status: Abnormal   Collection Time: 01/21/19  1:35 PM  Result Value Ref Range Status   Specimen Description BLOOD RIGHT ANTECUBITAL  Final   Special Requests   Final    BOTTLES DRAWN AEROBIC AND ANAEROBIC Blood Culture adequate volume   Culture  Setup Time   Final    GRAM POSITIVE COCCI IN BOTH AEROBIC AND ANAEROBIC BOTTLES CRITICAL RESULT CALLED TO, READ BACK BY AND VERIFIED WITH: Karlene Einstein PharmD 11:00 01/22/19 ([email or web address]) Performed at [facility], [place] [address] [place], [place] [address]    Culture STAPHYLOCOCCUS EPIDERMIDIS (A)  Final   Report Status 01/24/2019 FINAL  Final   Organism ID, Bacteria STAPHYLOCOCCUS EPIDERMIDIS  Final      Susceptibility   Staphylococcus epidermidis - MIC*    CIPROFLOXACIN >=8 RESISTANT Resistant     ERYTHROMYCIN  >=8 RESISTANT Resistant     GENTAMICIN <=0.5 SENSITIVE Sensitive     OXACILLIN >=4 RESISTANT Resistant     TETRACYCLINE 2 SENSITIVE Sensitive     VANCOMYCIN 1 SENSITIVE Sensitive     TRIMETH/SULFA 80 RESISTANT Resistant     CLINDAMYCIN >=8 RESISTANT Resistant  RIFAMPIN <=0.5 SENSITIVE Sensitive     Inducible Clindamycin NEGATIVE Sensitive     * STAPHYLOCOCCUS EPIDERMIDIS  Blood Culture (routine x 2)     Status: Abnormal   Collection Time: 01/21/19  1:35 PM  Result Value Ref Range Status   Specimen Description BLOOD RIGHT ANTECUBITAL  Final   Special Requests   Final    BOTTLES DRAWN AEROBIC AND ANAEROBIC Blood Culture adequate volume   Culture  Setup Time   Final    GRAM POSITIVE COCCI ANAEROBIC BOTTLE ONLY CRITICAL VALUE NOTED.  VALUE IS CONSISTENT WITH PREVIOUSLY REPORTED AND CALLED VALUE.    Culture (A)  Final    STAPHYLOCOCCUS EPIDERMIDIS SUSCEPTIBILITIES PERFORMED ON PREVIOUS CULTURE WITHIN THE LAST 5 DAYS. Performed at [facility], [place] [address] [place], [place] [address]    Report Status 01/24/2019 FINAL  Final  Blood Culture ID Panel (Reflexed)     Status: Abnormal   Collection Time: 01/21/19  1:35 PM  Result Value Ref Range Status   Enterococcus species NOT DETECTED NOT DETECTED Final   Listeria monocytogenes NOT DETECTED NOT DETECTED Final   Staphylococcus species DETECTED (A) NOT DETECTED Final    Comment: Methicillin (oxacillin) resistant coagulase negative staphylococcus. Possible blood culture contaminant (unless isolated from more than one blood culture draw or clinical case suggests pathogenicity). No antibiotic treatment is indicated for blood  culture contaminants. CRITICAL RESULT CALLED TO, READ BACK BY AND VERIFIED WITH: Karlene Einstein PharmD 11:00 01/22/19 ([email or web address])    Staphylococcus aureus (BCID) NOT DETECTED NOT DETECTED Final   Methicillin resistance DETECTED (A) NOT DETECTED Final    Comment: CRITICAL RESULT CALLED TO, READ BACK BY AND  VERIFIED WITH: Karlene Einstein PharmD 11:00 01/22/19 ([email or web address])    Streptococcus species NOT DETECTED NOT DETECTED Final   Streptococcus agalactiae NOT DETECTED NOT DETECTED Final   Streptococcus pneumoniae NOT DETECTED NOT DETECTED Final   Streptococcus pyogenes NOT DETECTED NOT DETECTED Final   Acinetobacter baumannii NOT DETECTED NOT DETECTED Final   Enterobacteriaceae species NOT DETECTED NOT DETECTED Final   Enterobacter cloacae complex NOT DETECTED NOT DETECTED Final   Escherichia coli NOT DETECTED NOT DETECTED Final   Klebsiella oxytoca NOT DETECTED NOT DETECTED Final   Klebsiella pneumoniae NOT DETECTED NOT DETECTED Final   Proteus species NOT DETECTED NOT DETECTED Final   Serratia marcescens NOT DETECTED NOT DETECTED Final   Haemophilus influenzae NOT DETECTED NOT DETECTED Final   Neisseria meningitidis NOT DETECTED NOT DETECTED Final   Pseudomonas aeruginosa NOT DETECTED NOT DETECTED Final   Candida albicans NOT DETECTED NOT DETECTED Final   Candida glabrata NOT DETECTED NOT DETECTED Final   Candida krusei NOT DETECTED NOT DETECTED Final   Candida parapsilosis NOT DETECTED NOT DETECTED Final   Candida tropicalis NOT DETECTED NOT DETECTED Final    Comment: Performed at [facility], [place]. [address], [place], [place] [address]  Urine culture     Status: Abnormal   Collection Time: 01/21/19  4:20 PM  Result Value Ref Range Status   Specimen Description URINE, RANDOM  Final   Special Requests NONE  Final   Culture (A)  Final    >=100,000 COLONIES/mL PROTEUS MIRABILIS Susceptibility Pattern Suggests Possibility of an Extended Spectrum Beta Lactamase Producer. Contact Laboratory Within 7 Days if Confirmation Warranted. Performed at [facility], [place] [address] [place], [place] [address]    Report Status 01/23/2019 FINAL  Final   Organism ID, Bacteria PROTEUS MIRABILIS (A)  Final  Susceptibility   Proteus mirabilis - MIC*    AMPICILLIN >=32 RESISTANT Resistant      CEFAZOLIN >=64 RESISTANT Resistant     CEFTRIAXONE RESISTANT Resistant     CIPROFLOXACIN >=4 RESISTANT Resistant     GENTAMICIN <=1 SENSITIVE Sensitive     IMIPENEM 2 SENSITIVE Sensitive     NITROFURANTOIN 128 RESISTANT Resistant     TRIMETH/SULFA >=320 RESISTANT Resistant     AMPICILLIN/SULBACTAM >=32 RESISTANT Resistant     PIP/TAZO <=4 SENSITIVE Sensitive     * >=100,000 COLONIES/mL PROTEUS MIRABILIS  MRSA PCR Screening     Status: None   Collection Time: 01/21/19  8:35 PM  Result Value Ref Range Status   MRSA by PCR NEGATIVE NEGATIVE Final    Comment:        The GeneXpert MRSA Assay (FDA approved for NASAL specimens only), is one component of a comprehensive MRSA colonization surveillance program. It is not intended to diagnose MRSA infection nor to guide or monitor treatment for MRSA infections. Performed at [facility], [place] [address] [place], [place] [address]   Culture, blood (routine x 2)     Status: None (Preliminary result)   Collection Time: 01/23/19 11:31 AM  Result Value Ref Range Status   Specimen Description BLOOD RIGHT THUMB  Final   Special Requests   Final    BOTTLES DRAWN AEROBIC ONLY Blood Culture adequate volume Performed at [facility], [place] [address], [place], [place] [address]    Culture NO GROWTH 2 DAYS  Final   Report Status PENDING  Incomplete  Culture, blood (routine x 2)     Status: None (Preliminary result)   Collection Time: 01/23/19 11:37 AM  Result Value Ref Range Status   Specimen Description BLOOD LEFT HAND  Final   Special Requests   Final    BOTTLES DRAWN AEROBIC ONLY Blood Culture results may not be optimal due to an inadequate volume of blood received in culture bottles Performed at [place] [address], [place], [place] [address]    Culture NO GROWTH 2 DAYS  Final   Report Status PENDING  Incomplete    RADIOLOGY STUDIES/RESULTS: Dg Chest Port 1 View  Result Date: 01/21/2019 CLINICAL DATA:   Altered mental status/nonresponsive EXAM: PORTABLE CHEST 1 VIEW COMPARISON:  Chest radiograph December 06, 2018 and chest CT December 07, 2018 FINDINGS: There is no appreciable edema or consolidation. Heart size and pulmonary vascularity are normal. No adenopathy. Patient is undergone previous mid lower thoracic kyphoplasty procedures. There is aortic atherosclerosis. Bones are osteoporotic. IMPRESSION: No edema or consolidation.  Stable cardiac silhouette. Aortic Atherosclerosis (ICD10-I70.0). Electronically Signed   By: Lowella Grip III M.D.   On: 01/21/2019 13:36     LOS: 4 days   Oren Binet, MD  [facility]  If 7PM-7AM, please contact night-coverage  Please page via [email or web address]  Go to [email or web address] and use [facility]'s universal password to access. If you do not have the password, please contact the hospital operator.  Locate the [facility] provider you are looking for under [facility] and page to a number that you can be directly reached. If you still have difficulty reaching the provider, please page the [facility] (Director on Call) for the Hospitalists listed on amion for assistance.  01/25/2019, 2:25 PM

## 2019-01-25 NOTE — Progress Notes (Signed)
Pharmacy Antibiotic Note  [name] is a 73 y.o. female admitted on 01/21/2019 with sepsis.  Blood cultures 2/24  came back positive for staph epi in 3/4 blood cultures. Patient got a load of vancomycin 2500 mg on 2/24. Renal function is improved to SCr 2.54. She got a dose of 1.5 gm yesterday. First dose levels are 42 and 32 with predicted current AUC ~ 800, which is supratherapeutic.    Plan: Reduce vancomycin to 1 gm Q 24 hours  Predicted AUC 536  Monitor renal function and repeat levels as appropriate  Increase meropenem to 1 gm Q 8 hours- Treat for 5 days total    Height: 5' 8.5" (174 cm) Weight: 224 lb 3.3 oz (101.7 kg) IBW/kg (Calculated) : 65.05  Temp (24hrs), Avg:97.9 F (36.6 C), Min:97.4 F (36.3 C), Max:98.6 F (37 C)  Recent Labs  Lab 01/21/19 1309 01/21/19 1335 01/21/19 1718 01/21/19 2042 01/22/19 0612 01/23/19 0351  01/24/19 0444 01/24/19 2032 01/25/19 0439  WBC 12.3*  --   --   --  9.8 7.0  --   --   --   --   CREATININE 4.89*  --   --  3.99* 3.55* 2.54*  --  1.24*  --  1.03*  LATICACIDVEN  --  3.0* 2.5*  --   --   --   --   --   --   --   VANCOPEAK  --   --   --   --   --   --   --   --  42*  --   VANCORANDOM  --   --   --   --   --   --    < > 20  --  32   < > = values in this interval not displayed.    Estimated Creatinine Clearance: 60.3 mL/min (A) (by C-G formula based on SCr of 1.03 mg/dL (H)).    Allergies  Allergen Reactions  . Oxycodone Nausea Only    Antimicrobials this admission: 2/24 vancomycin >>  2/24 cefepime >> 2/25 2/27 merrem>>  Dose adjustments this admission:  2/28- Vanc levels 42 and 32 - AUC ~ 800- Reduce to 1 gm Q 24   Microbiology results:  2/24 blood>>3/4 MRSE 2/24 MRSA>>neg 2/24 urine>>ESBL proteus  Jimmy Footman, PharmD, BCPS, BCIDP Infectious Diseases Clinical Pharmacist Phone: [phone] 01/25/2019 9:05 AM

## 2019-01-25 NOTE — Progress Notes (Signed)
At bladder scan there were 662 ml urine retention. MD notified. Will continue to monitor.

## 2019-01-25 NOTE — Progress Notes (Signed)
CSW still unable to find accepting SNF due to patient not being able to participate with therapy and not having Medicaid for long term care. CSW will continue search.  [name] Melissa Krone LCSW [phone]

## 2019-01-25 NOTE — Progress Notes (Signed)
CCMD called and informed staff that patient has paroxysmal atrial tachycardia - 45-50 bts. Patient asymptomatic, resting quietly in bed. MD informed. Will continue to monitor.

## 2019-01-25 NOTE — Progress Notes (Signed)
Foley 14 Fr inserted per MD order. Patient tolerated well. 300 ml urine returned. Will continue to monitor.

## 2019-01-25 NOTE — Evaluation (Signed)
Clinical/Bedside Swallow Evaluation Patient Details  Name: [name] MRN: [id number] Date of Birth: Dec 09, 1945  Today's Date: 01/25/2019 Time: SLP Start Time (ACUTE ONLY): 1410 SLP Stop Time (ACUTE ONLY): 1433 SLP Time Calculation (min) (ACUTE ONLY): 23 min  Past Medical History:  Past Medical History:  Diagnosis Date  . Anxiety    Due to current back pain and has to lie flat.  . Arthritis   . CAD (coronary artery disease)    a. NSTEMI in setting of UTI and SVT 12/2012 => LHC 01/08/13: Proximal LAD 30%, ostial diagonal 30-40%, proximal RCA 95%, inferior AK, EF 45-50% => PCI: Promus DES x 2 to RCA;  b. Echo 01/05/13: EF 61-60%, grade 1 diastolic dysfunction, MAC   . Compression fracture    thoracic 12  . Edema   . History of blood transfusion   . Hypercholesteremia   . Hypertension   . Myocardial infarction ([place]) 2014  . Rectal ulceration    colonoscopy 12/2012  . SVT (supraventricular tachycardia) ([place])   . Type II diabetes mellitus ([place])    Past Surgical History:  Past Surgical History:  Procedure Laterality Date  . CATARACT EXTRACTION, BILATERAL Bilateral   . COLONOSCOPY Left 01/12/2013   Procedure: COLONOSCOPY;  Surgeon: Wonda Horner, MD;  Location: [facility] ENDOSCOPY;  Service: Endoscopy;  Laterality: Left;  . COLONOSCOPY N/A 01/14/2013   Procedure: COLONOSCOPY;  Surgeon: Wonda Horner, MD;  Location: [facility] ENDOSCOPY;  Service: Endoscopy;  Laterality: N/A;  Rm 2034, attemped Colon on 2-15=inadequate prep  . CORONARY ANGIOPLASTY  12/2012  . EYE SURGERY Bilateral "many"  . FRACTURE SURGERY    . KYPHOPLASTY  12/27/2012   Procedure: KYPHOPLASTY;  Surgeon: Sinclair Ship, MD;  Location: [place];  Service: Orthopedics;  Laterality: Bilateral;  T-8 kyphoplasty  . KYPHOPLASTY N/A 07/15/2015   Procedure: T11 KYPHOPLASTY;  Surgeon: Phylliss Bob, MD;  Location: [place];  Service: Orthopedics;  Laterality: N/A;  T11 kyphoplasty  . KYPHOPLASTY N/A 09/17/2015   Procedure: T12 kyphoplasty;   Surgeon: Phylliss Bob, MD;  Location: [place];  Service: Orthopedics;  Laterality: N/A;  T12 kyphoplasty  . LEFT HEART CATHETERIZATION WITH CORONARY ANGIOGRAM N/A 01/08/2013   Procedure: LEFT HEART CATHETERIZATION WITH CORONARY ANGIOGRAM;  Surgeon: Thayer Headings, MD;  Location: [facility] CATH LAB;  Service: Cardiovascular;  Laterality: N/A;  . ORIF HIP FRACTURE Left 11/15/2014   Procedure: OPEN REDUCTION INTERNAL FIXATION HIP INTERTROCH;  Surgeon: Mcarthur Rossetti, MD;  Location: [place];  Service: Orthopedics;  Laterality: Left;  . PERCUTANEOUS STENT INTERVENTION N/A 01/15/2013   Procedure: PERCUTANEOUS STENT INTERVENTION;  Surgeon: Peter M Martinique, MD;  Location: [facility] CATH LAB;  Service: Cardiovascular;  Laterality: N/A;  . TIBIA IM NAIL INSERTION Right 05/26/2016   Procedure: INTRAMEDULLARY (IM) NAIL TIBIAL;  Surgeon: Meredith Pel, MD;  Location: [place];  Service: Orthopedics;  Laterality: Right;  . TUBAL LIGATION     HPI:  73 yr old admitted severly obtunded and hypotensive. PMH: DM, HTN, MI. Pt recently hospitalized with nausea and UTI. This admit found to have Sepsis with hypotension secondary to staph epidermidis bacteremia and ESBL Proteus UTI, Hypokalemia, hyponatremia, acute renal failure. No recent CXR or ST notes.       Assessment / Plan / Recommendation Clinical Impression  RN reports pt coughing this morning with crushed meds and sip thin liquid. Pt in chair requesting to be put back to bed, very anxious during assessment but redirectable with cues. Vocal quality clear, no  focal facial/labial weakness. She was gagging intermittently prior to po's. Oral cavity had mild dried secretions, cleared with water swishes versus oral care that may have led to emesis. Intake was limited and she needed max encouragement to consume one bite applesauce. Observed with approximately 8 sips water/gingerale without cough but frequent gagging. Full liquid diet may be better tolerated than regular therefore  SLP modified texture. Continue thin liquids, crush meds and frequent oral care as bacterial load likely increased given lower dental decay. Will follow pt for safety, intake and upgrade when able.        SLP Visit Diagnosis: Dysphagia, unspecified (R13.10)    Aspiration Risk  Mild aspiration risk    Diet Recommendation Thin liquid;Other (Comment)(full liquid diet)   Liquid Administration via: Straw;Cup Medication Administration: Crushed with puree Supervision: Staff to assist with self feeding;Full supervision/cueing for compensatory strategies Compensations: Minimize environmental distractions;Slow rate;Small sips/bites Postural Changes: Seated upright at 90 degrees    Other  Recommendations Oral Care Recommendations: Oral care BID   Follow up Recommendations Other (comment)(TBD)      Frequency and Duration min 2x/week  2 weeks       Prognosis Prognosis for Safe Diet Advancement: Good Barriers to Reach Goals: Other (Comment)(anxiety)      Swallow Study   General HPI: 73 yr old admitted severly obtunded and hypotensive. PMH: DM, HTN, MI. Pt recently hospitalized with nausea and UTI. This admit found to have Sepsis with hypotension secondary to staph epidermidis bacteremia and ESBL Proteus UTI, Hypokalemia, hyponatremia, acute renal failure. No recent CXR or ST notes.     Type of Study: Bedside Swallow Evaluation Previous Swallow Assessment: (none) Diet Prior to this Study: Regular;Thin liquids Temperature Spikes Noted: No Respiratory Status: Nasal cannula History of Recent Intubation: No Behavior/Cognition: Alert;Other (Comment);Confused;Cooperative;Distractible;Requires cueing(anxious) Oral Cavity Assessment: Dried secretions Oral Care Completed by SLP: No(nauseated, oral care would exacerbate- water swish) Oral Cavity - Dentition: Poor condition;Missing dentition;Dentures, top(4 lower teeth- significant decay) Vision: Functional for self-feeding Self-Feeding Abilities:  Needs assist;Needs set up(d/t edema) Patient Positioning: Upright in chair Baseline Vocal Quality: Normal Volitional Cough: Weak Volitional Swallow: Able to elicit    Oral/Motor/Sensory Function Overall Oral Motor/Sensory Function: Other (comment)(no focal weakness)   Ice Chips Ice chips: Not tested   Thin Liquid Thin Liquid: Impaired Presentation: Cup;Straw Oral Phase Functional Implications: Oral holding(d/t nausea) Pharyngeal  Phase Impairments: (none)    Nectar Thick Nectar Thick Liquid: Not tested   Honey Thick Honey Thick Liquid: Not tested   Puree Puree: Impaired Presentation: Spoon Oral Phase Functional Implications: Oral holding Pharyngeal Phase Impairments: (none)   Solid     Solid: Not tested      Houston Siren 01/25/2019,3:03 PM  Orbie Pyo Colvin Caroli.Ed Risk analyst [phone] Office [phone]

## 2019-01-25 NOTE — Progress Notes (Signed)
Physical Therapy Treatment Patient Details Name: RONESHA HEENAN MRN: [id number] DOB: 1945/12/17 Today's Date: 01/25/2019    History of Present Illness  XAVIER FOURNIER is a 73 year old female with PMH of MI/CAD status post PCI 8182, chronic diastolic CHF, DM 2/IDDM, HTN, HLD, recently hospitalized 12/06/2018-12/11/2018 when she presented with confusion and incontinence after starting meloxicam and baclofen for chest pain, treated in the hospital for acute metabolic encephalopathy due to E. coli UTI and baclofen, suspected mild dementia, musculoskeletal chest pain, discharged to [facility]. Was supposed to go home 2/24 when she was noted to be more confused and very weak. Has been unable to work with therapy at [facility].     PT Comments     Pt presented in bed with nursing administering care. Pt stated pain of 6/10 all over her body. Pt presented with stool/urinary incontinence and required care from therapy team and nursing to clean pt and change skin cover padding. Pt requires total A +2 for all bed mobility and transfers. Unable to attempt stedy lift sit to stand as pt could only lift BUE a few degrees into shoulder flexion. Lateral scoot/total A +2 transfer from elevated bed using bed padding to get pt into recliner chair for lunch. Pt stated she was nauseous when arriving to the chair from bed, but pt was normalized upon end of session. Upon performing bed mobility and side-lying to sit, pt HR went as high as 138 BPM. Vitals WNL at end of transfer. Pt bolstered with pillows to support all four extremities. Pt d/c plan for SNF remains appropriate at this time based on progress. Plan to progress pt with bed mobility, transfers, and therex as tolerated to improve functional mobility and hygiene/self care.   Follow Up Recommendations  SNF;Supervision/Assistance - 24 hour     Equipment Recommendations  None recommended by PT    Recommendations for Other Services       Precautions / Restrictions  Precautions Precautions: Fall Restrictions Weight Bearing Restrictions: No    Mobility  Bed Mobility Overal bed mobility: Needs Assistance Bed Mobility: Rolling;Sidelying to Sit Rolling: Total assist;+2 for physical assistance   Supine to sit: Total assist;+2 for physical assistance     General bed mobility comments: Pt requires max A +2 for all bed mobility. Once seated pt slumps over extremely hard and is weak to the point that she has to be held up with external support. Pt able to remain sidelying for extended time while care team assisted with cleaning pt and reapplying gel/foam wound and crease pads.   Transfers Overall transfer level: Needs assistance   Transfers: Lateral/Scoot Transfers          Lateral/Scoot Transfers: Total assist;+2 physical assistance;From elevated surface General transfer comment: Unable to attempt stedy lift transfer as pt could not lift own arms. Total A +2 went smoothly and pt transfered well with the recliner positioned close to bed with arm down.  Ambulation/Gait             General Gait Details: unable   Stairs             Wheelchair Mobility    Modified Rankin (Stroke Patients Only)       Balance Overall balance assessment: Needs assistance Sitting-balance support: Bilateral upper extremity supported Sitting balance-Leahy Scale: Zero Sitting balance - Comments: Pt required clinician to fully assist with sitting balance. Pt unable to sit upright on EOB without total support. Postural control: Posterior lean;Other (comment)(extreme thoracic kyphosis)  Cognition Arousal/Alertness: Lethargic Behavior During Therapy: Anxious Overall Cognitive Status: Impaired/Different from baseline Area of Impairment: Orientation;Attention;Memory;Following commands;Safety/judgement;Awareness;Problem solving                         Safety/Judgement: Decreased awareness of  safety;Decreased awareness of deficits   Problem Solving: Decreased initiation;Requires verbal cues;Requires tactile cues;Slow processing;Difficulty sequencing General Comments: pt confused with unintelligible speech and some understandable but nonsensical. Lethargic and seems anxious when aroused      Exercises      General Comments        Pertinent Vitals/Pain Pain Assessment: 0-10 Pain Score: 6  Pain Location: pt stated she hurt all over her body Pain Descriptors / Indicators: Grimacing    Home Living                      Prior Function            PT Goals (current goals can now be found in the care plan section) Acute Rehab PT Goals Patient Stated Goal: unable to state PT Goal Formulation: Patient unable to participate in goal setting Potential to Achieve Goals: Poor    Frequency    Min 2X/week      PT Plan Current plan remains appropriate    Co-evaluation              AM-PAC PT "6 Clicks" Mobility   Outcome Measure  Help needed turning from your back to your side while in a flat bed without using bedrails?: Total Help needed moving from lying on your back to sitting on the side of a flat bed without using bedrails?: Total Help needed moving to and from a bed to a chair (including a wheelchair)?: Total Help needed standing up from a chair using your arms (e.g., wheelchair or bedside chair)?: Total Help needed to walk in hospital room?: Total Help needed climbing 3-5 steps with a railing? : Total 6 Click Score: 6    End of Session Equipment Utilized During Treatment: Oxygen Activity Tolerance: Patient limited by lethargy;Patient limited by pain;Treatment limited secondary to medical complications (Comment) Patient left: with call bell/phone within reach;in chair;with chair alarm set Nurse Communication: Mobility status(need for someone to assist with feeding meals) PT Visit Diagnosis: Pain;Muscle weakness (generalized) (M62.81) Pain - part  of body: Leg;Arm     Time: 4163-8453 PT Time Calculation (min) (ACUTE ONLY): 38 min  Charges:  $Therapeutic Activity: 38-52 mins                     Maryelizabeth Kaufmann, SPTA   Maryelizabeth Kaufmann 01/25/2019, 2:03 PM

## 2019-01-25 NOTE — Progress Notes (Signed)
Patient was holding her urine. Bladder scan done it was 538 ml. Notified on call provider and did IN and out cath it was 600 ml.

## 2019-01-26 ENCOUNTER — Inpatient Hospital Stay: Payer: Self-pay

## 2019-01-26 LAB — BASIC METABOLIC PANEL
Anion gap: 8 (ref 5–15)
BUN: 31 mg/dL — ABNORMAL HIGH (ref 8–23)
CO2: 32 mmol/L (ref 22–32)
Calcium: 8.4 mg/dL — ABNORMAL LOW (ref 8.9–10.3)
Chloride: 106 mmol/L (ref 98–111)
Creatinine, Ser: 0.75 mg/dL (ref 0.44–1.00)
GFR calc Af Amer: >60 mL/min (ref >60)
GFR calc non Af Amer: >60 mL/min (ref >60)
Glucose, Bld: 122 mg/dL — ABNORMAL HIGH (ref 70–99)
POTASSIUM: 3.4 mmol/L — AB (ref 3.5–5.1)
Sodium: 146 mmol/L — ABNORMAL HIGH (ref 135–145)

## 2019-01-26 LAB — GLUCOSE, CAPILLARY
Glucose-Capillary: 103 mg/dL — ABNORMAL HIGH (ref 70–99)
Glucose-Capillary: 113 mg/dL — ABNORMAL HIGH (ref 70–99)
Glucose-Capillary: 115 mg/dL — ABNORMAL HIGH (ref 70–99)
Glucose-Capillary: 123 mg/dL — ABNORMAL HIGH (ref 70–99)

## 2019-01-26 LAB — MAGNESIUM: Magnesium: 2.1 mg/dL (ref 1.7–2.4)

## 2019-01-26 MED ORDER — SODIUM CHLORIDE 0.9% FLUSH: 10.0000 mL | INTRAVENOUS | Status: DC | PRN

## 2019-01-26 MED ORDER — POTASSIUM CHLORIDE CRYS ER 20 MEQ PO TBCR
40.0000 meq | EXTENDED_RELEASE_TABLET | Freq: Every day | ORAL | Status: DC
  Administered 2019-01-26 – 2019-02-08 (×14): 40 meq via ORAL
  Filled 2019-01-26 (×14): qty 2

## 2019-01-26 NOTE — Progress Notes (Signed)
PROGRESS NOTE        PATIENT DETAILS Name: [name] Age: 73 y.o. Sex: female Date of Birth: Apr 04, 1946 Admit Date: 01/21/2019 Admitting Physician Modena Jansky, MD [id number], [name], MD  Brief Narrative: Patient is a 73 y.o. female with prior history of CAD status post PCI in 3419, chronic diastolic heart failure, DM-2, hypertension, dyslipidemia presenting with acute metabolic encephalopathy secondary to acute kidney injury and sepsis with hypotension from gram-positive bacteremia.  See below for further details  Subjective: Much more awake and alert compared to the past few days-was able to answer all my questions much more fluently today compared to the past few days.   Assessment/Plan: Sepsis with hypotension secondary to staph epidermidis bacteremia and ESBL Proteus UTI: 3/4 bottles positive for staph epidermidis, urine culture positive for ESBL Proteus.  Sepsis pathophysiology has resolved-she is much more awake and alert.  Repeat blood cultures on 2/26- so far.  Most recent echo on 12/08/2018 was without any vegetations.  Spoke with Dr. Manon Hilding are for 10 days of IV vancomycin from 2/26 (date of negative blood cultures) and to continue with meropenem with stop date of 3/2.  Acute renal failure: Hemodynamically mediated in the setting of hypotension and use of diuretics, ACEI, NSAIDs.  Renal function has improved and has essentially normalized.    Anion gap metabolic acidosis: Secondary to above-acidosis has markedly improved with improvement in renal function.  Briefly was on D5W with bicarb.  Acute metabolic encephalopathy: Secondary to acute renal failure and sepsis.  Nonfocal exam-has improved-she is much more awake and alert compared to the past few days  Hypokalemia: Awaiting morning labs-but this was repleted yesterday-follow-up labs.   Hypernatremia: Secondary to poor oral intake-change IV fluids to hypotonic solution.   Follow pending labs.  Hyponatremia: Resolved  CAD s/p PCI 2014: Supportive care-no obvious anginal symptoms present.  Hypertension: Blood pressure controlled low-dose metoprolol  Runs of narrow complex tachycardia/SVT: Stable with low-dose metoprolol.   Chronic diastolic heart failure: Volume status stable-monitor closely while on IV fluids.  Continue to hold diuretics for now.  Insulin-dependent DM-2: CBG stable-continue SSI.  Continue to hold oral hypoglycemic agents and Actos for now.  Large multinodular goiter: Stable for outpatient follow-up-this is a chronic issue.  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  Dysphagia: On full liquid diet-speech therapy following.  DVT Prophylaxis: Prophylactic Heparin   Code Status: DNR  Family Communication: None at bedside  Disposition Plan: Remain inpatient SNF on discharge-likely next week  Antimicrobial agents: Anti-infectives (From admission, onward)   Start     Dose/Rate Route Frequency Ordered Stop   01/25/19 1500  vancomycin (VANCOCIN) IVPB 1000 mg/200 mL premix     1,000 mg 200 mL/hr over 60 Minutes Intravenous Every 24 hours 01/25/19 0947     01/25/19 1400  meropenem (MERREM) 1 g in sodium chloride 0.9 % 100 mL IVPB     1 g 200 mL/hr over 30 Minutes Intravenous Every 8 hours 01/25/19 0947 01/28/19 2159   01/24/19 1500  vancomycin (VANCOCIN) 1,500 mg in sodium chloride 0.9 % 500 mL IVPB  Status:  Discontinued     1,500 mg 250 mL/hr over 120 Minutes Intravenous Every 24 hours 01/24/19 1403 01/25/19 0947   01/23/19 2330  meropenem (MERREM) 1 g in sodium chloride 0.9 % 100 mL IVPB  Status:  Discontinued     1 g 200 mL/hr over 30 Minutes Intravenous Every 12 hours 01/23/19 2321 01/25/19 0947   01/22/19 1200  ceFEPIme (MAXIPIME) 1 g in sodium chloride 0.9 % 100 mL IVPB  Status:  Discontinued     1 g 200 mL/hr over 30 Minutes Intravenous Every 24 hours 01/21/19 1446  01/22/19 0956   01/21/19 1446  vancomycin variable dose per unstable renal function (pharmacist dosing)  Status:  Discontinued      Does not apply See admin instructions 01/21/19 1446 01/22/19 0941   01/21/19 1415  ceFEPIme (MAXIPIME) 2 g in sodium chloride 0.9 % 100 mL IVPB     2 g 200 mL/hr over 30 Minutes Intravenous  Once 01/21/19 1403 01/21/19 1449   01/21/19 1415  vancomycin (VANCOCIN) IVPB 1000 mg/200 mL premix  Status:  Discontinued     1,000 mg 200 mL/hr over 60 Minutes Intravenous  Once 01/21/19 1403 01/21/19 1411   01/21/19 1415  vancomycin (VANCOCIN) 2,500 mg in sodium chloride 0.9 % 500 mL IVPB     2,500 mg 250 mL/hr over 120 Minutes Intravenous  Once 01/21/19 1411 01/21/19 1937      Procedures: None  CONSULTS:  None  Time spent: 25 minutes-Greater than 50% of this time was spent in counseling, explanation of diagnosis, planning of further management, and coordination of care.  MEDICATIONS: Scheduled Meds: . feeding supplement (GLUCERNA SHAKE)  237 mL Oral TID BM  . heparin  5,000 Units Subcutaneous Q8H  . insulin aspart  0-5 Units Subcutaneous QHS  . insulin aspart  0-9 Units Subcutaneous TID WC  . metoprolol tartrate  5 mg Intravenous Q6H  . sodium chloride flush  3 mL Intravenous Q12H  . tamsulosin  0.4 mg Oral Daily   Continuous Infusions: . dextrose 5 % 1,000 mL with potassium chloride 40 mEq infusion 50 mL/hr at 01/26/19 1122  . meropenem (MERREM) IV 1 g (01/26/19 0524)  . vancomycin 1,000 mg (01/25/19 1505)   PRN Meds:.acetaminophen **OR** acetaminophen, albuterol, ondansetron (ZOFRAN) IV, senna-docusate, sodium chloride flush   PHYSICAL EXAM: Vital signs: Vitals:   01/25/19 2230 01/26/19 0230 01/26/19 0500 01/26/19 0755  BP: 116/84 111/84  (!) 115/54  Pulse: (!) 129 94    Resp: (!) 31 17    Temp:    (!) 97.5 F (36.4 C)  TempSrc:    Oral  SpO2: 99% 99%    Weight:   102.1 kg   Height:       Filed Weights   01/25/19 0056 01/25/19 0551  01/26/19 0500  Weight: 101.6 kg 101.7 kg 102.1 kg   Body mass index is 33.73 kg/m.   General appearance:Awake, alert, not in any distress.  Eyes:no scleral icterus. HEENT: Atraumatic and Normocephalic Neck: supple, no JVD. Resp:Good air entry bilaterally,no rales or rhonchi heard anteriorly CVS: S1 S2 regular GI: Bowel sounds present, Non tender and not distended with no gaurding, rigidity or rebound. Extremities: B/L Lower Ext shows no edema, both legs are warm to touch Neurology:  Non focal Psychiatric: Normal judgment and insight. Normal mood. Musculoskeletal:No digital cyanosis Skin:No Rash, warm and dry Wounds:N/A  I have personally reviewed following labs and imaging studies  LABORATORY DATA: CBC: Recent Labs  Lab 01/21/19 1309 01/22/19 0612 01/23/19 0351  WBC 12.3* 9.8 7.0  NEUTROABS 10.0*  --   --   HGB 13.8 12.9 13.1  HCT 42.3 40.0 37.3  MCV 91.4 93.2 87.6  PLT 180 149* 104*    Basic  Metabolic Panel: Recent Labs  Lab 01/21/19 2042 01/22/19 0612 01/23/19 0351 01/24/19 0444 01/25/19 0439  NA 135 138 140 149* 149*  K 4.8 4.2 3.8 2.3* 2.8*  CL 106 107 106 104 107  CO2 13* 14* 15* 30 31  GLUCOSE 120* 80 170* 121* 156*  BUN 124* 122* 110* 70* 48*  CREATININE 3.99* 3.55* 2.54* 1.24* 1.03*  CALCIUM 7.9* 7.6* 7.5* 7.7* 7.7*  MG  --   --   --   --  1.4*    GFR: Estimated Creatinine Clearance: 60.4 mL/min (A) (by C-G formula based on SCr of 1.03 mg/dL (H)).  Liver Function Tests: Recent Labs  Lab 01/21/19 1309 01/23/19 0351  AST 23 18  ALT 33 QUANTITY NOT SUFFICIENT, UNABLE TO PERFORM TEST  ALKPHOS 59 45  BILITOT 0.9 QUANTITY NOT SUFFICIENT, UNABLE TO PERFORM TEST  PROT 6.7 5.5*  ALBUMIN 2.2* 1.8*   No results for input(s): LIPASE, AMYLASE in the last 168 hours. No results for input(s): AMMONIA in the last 168 hours.  Coagulation Profile: No results for input(s): INR, PROTIME in the last 168 hours.  Cardiac Enzymes: No results for input(s):  CKTOTAL, CKMB, CKMBINDEX, TROPONINI in the last 168 hours.  BNP (last 3 results) No results for input(s): PROBNP in the last 8760 hours.  HbA1C: No results for input(s): HGBA1C in the last 72 hours.  CBG: Recent Labs  Lab 01/25/19 0820 01/25/19 1303 01/25/19 1700 01/25/19 2256 01/26/19 0753  GLUCAP 149* 156* 125* 96 103*    Lipid Profile: No results for input(s): CHOL, HDL, LDLCALC, TRIG, CHOLHDL, LDLDIRECT in the last 72 hours.  Thyroid Function Tests: No results for input(s): TSH, T4TOTAL, FREET4, T3FREE, THYROIDAB in the last 72 hours.  Anemia Panel: No results for input(s): VITAMINB12, FOLATE, FERRITIN, TIBC, IRON, RETICCTPCT in the last 72 hours.  Urine analysis:    Component Value Date/Time   COLORURINE YELLOW 01/21/2019 1620   APPEARANCEUR TURBID (A) 01/21/2019 1620   LABSPEC 1.020 01/21/2019 1620   PHURINE 8.0 01/21/2019 1620   GLUCOSEU NEGATIVE 01/21/2019 1620   HGBUR MODERATE (A) 01/21/2019 1620   BILIRUBINUR NEGATIVE 01/21/2019 1620   KETONESUR NEGATIVE 01/21/2019 1620   PROTEINUR 100 (A) 01/21/2019 1620   UROBILINOGEN 1.0 06/28/2015 1552   NITRITE NEGATIVE 01/21/2019 1620   LEUKOCYTESUR LARGE (A) 01/21/2019 1620    Sepsis Labs: Lactic Acid, Venous    Component Value Date/Time   LATICACIDVEN 2.5 (HH) 01/21/2019 1718    MICROBIOLOGY: Recent Results (from the past 240 hour(s))  Blood Culture (routine x 2)     Status: Abnormal   Collection Time: 01/21/19  1:35 PM  Result Value Ref Range Status   Specimen Description BLOOD RIGHT ANTECUBITAL  Final   Special Requests   Final    BOTTLES DRAWN AEROBIC AND ANAEROBIC Blood Culture adequate volume   Culture  Setup Time   Final    GRAM POSITIVE COCCI IN BOTH AEROBIC AND ANAEROBIC BOTTLES CRITICAL RESULT CALLED TO, READ BACK BY AND VERIFIED WITH: Karlene Einstein PharmD 11:00 01/22/19 ([email or web address]) Performed at [facility], [place] [address] [place], [place] [address]    Culture STAPHYLOCOCCUS EPIDERMIDIS  (A)  Final   Report Status 01/24/2019 FINAL  Final   Organism ID, Bacteria STAPHYLOCOCCUS EPIDERMIDIS  Final      Susceptibility   Staphylococcus epidermidis - MIC*    CIPROFLOXACIN >=8 RESISTANT Resistant     ERYTHROMYCIN >=8 RESISTANT Resistant     GENTAMICIN <=0.5 SENSITIVE Sensitive  OXACILLIN >=4 RESISTANT Resistant     TETRACYCLINE 2 SENSITIVE Sensitive     VANCOMYCIN 1 SENSITIVE Sensitive     TRIMETH/SULFA 80 RESISTANT Resistant     CLINDAMYCIN >=8 RESISTANT Resistant     RIFAMPIN <=0.5 SENSITIVE Sensitive     Inducible Clindamycin NEGATIVE Sensitive     * STAPHYLOCOCCUS EPIDERMIDIS  Blood Culture (routine x 2)     Status: Abnormal   Collection Time: 01/21/19  1:35 PM  Result Value Ref Range Status   Specimen Description BLOOD RIGHT ANTECUBITAL  Final   Special Requests   Final    BOTTLES DRAWN AEROBIC AND ANAEROBIC Blood Culture adequate volume   Culture  Setup Time   Final    GRAM POSITIVE COCCI ANAEROBIC BOTTLE ONLY CRITICAL VALUE NOTED.  VALUE IS CONSISTENT WITH PREVIOUSLY REPORTED AND CALLED VALUE.    Culture (A)  Final    STAPHYLOCOCCUS EPIDERMIDIS SUSCEPTIBILITIES PERFORMED ON PREVIOUS CULTURE WITHIN THE LAST 5 DAYS. Performed at [facility], [place] [address] [place], [place] [address]    Report Status 01/24/2019 FINAL  Final  Blood Culture ID Panel (Reflexed)     Status: Abnormal   Collection Time: 01/21/19  1:35 PM  Result Value Ref Range Status   Enterococcus species NOT DETECTED NOT DETECTED Final   Listeria monocytogenes NOT DETECTED NOT DETECTED Final   Staphylococcus species DETECTED (A) NOT DETECTED Final    Comment: Methicillin (oxacillin) resistant coagulase negative staphylococcus. Possible blood culture contaminant (unless isolated from more than one blood culture draw or clinical case suggests pathogenicity). No antibiotic treatment is indicated for blood  culture contaminants. CRITICAL RESULT CALLED TO, READ BACK BY AND VERIFIED WITH: Karlene Einstein PharmD 11:00 01/22/19 ([email or web address])    Staphylococcus aureus (BCID) NOT DETECTED NOT DETECTED Final   Methicillin resistance DETECTED (A) NOT DETECTED Final    Comment: CRITICAL RESULT CALLED TO, READ BACK BY AND VERIFIED WITH: Karlene Einstein PharmD 11:00 01/22/19 ([email or web address])    Streptococcus species NOT DETECTED NOT DETECTED Final   Streptococcus agalactiae NOT DETECTED NOT DETECTED Final   Streptococcus pneumoniae NOT DETECTED NOT DETECTED Final   Streptococcus pyogenes NOT DETECTED NOT DETECTED Final   Acinetobacter baumannii NOT DETECTED NOT DETECTED Final   Enterobacteriaceae species NOT DETECTED NOT DETECTED Final   Enterobacter cloacae complex NOT DETECTED NOT DETECTED Final   Escherichia coli NOT DETECTED NOT DETECTED Final   Klebsiella oxytoca NOT DETECTED NOT DETECTED Final   Klebsiella pneumoniae NOT DETECTED NOT DETECTED Final   Proteus species NOT DETECTED NOT DETECTED Final   Serratia marcescens NOT DETECTED NOT DETECTED Final   Haemophilus influenzae NOT DETECTED NOT DETECTED Final   Neisseria meningitidis NOT DETECTED NOT DETECTED Final   Pseudomonas aeruginosa NOT DETECTED NOT DETECTED Final   Candida albicans NOT DETECTED NOT DETECTED Final   Candida glabrata NOT DETECTED NOT DETECTED Final   Candida krusei NOT DETECTED NOT DETECTED Final   Candida parapsilosis NOT DETECTED NOT DETECTED Final   Candida tropicalis NOT DETECTED NOT DETECTED Final    Comment: Performed at [facility], [place]. [address] [place], [place] [address]  Urine culture     Status: Abnormal   Collection Time: 01/21/19  4:20 PM  Result Value Ref Range Status   Specimen Description URINE, RANDOM  Final   Special Requests NONE  Final   Culture (A)  Final    >=100,000 COLONIES/mL PROTEUS MIRABILIS Susceptibility Pattern Suggests Possibility of an Extended Spectrum Beta Lactamase Producer. Contact Laboratory Within  7 Days if Confirmation Warranted. Performed at [facility], [place] [address] [place], [place] [address]    Report Status 01/23/2019 FINAL  Final   Organism ID, Bacteria PROTEUS MIRABILIS (A)  Final      Susceptibility   Proteus mirabilis - MIC*    AMPICILLIN >=32 RESISTANT Resistant     CEFAZOLIN >=64 RESISTANT Resistant     CEFTRIAXONE RESISTANT Resistant     CIPROFLOXACIN >=4 RESISTANT Resistant     GENTAMICIN <=1 SENSITIVE Sensitive     IMIPENEM 2 SENSITIVE Sensitive     NITROFURANTOIN 128 RESISTANT Resistant     TRIMETH/SULFA >=320 RESISTANT Resistant     AMPICILLIN/SULBACTAM >=32 RESISTANT Resistant     PIP/TAZO <=4 SENSITIVE Sensitive     * >=100,000 COLONIES/mL PROTEUS MIRABILIS  MRSA PCR Screening     Status: None   Collection Time: 01/21/19  8:35 PM  Result Value Ref Range Status   MRSA by PCR NEGATIVE NEGATIVE Final    Comment:        The GeneXpert MRSA Assay (FDA approved for NASAL specimens only), is one component of a comprehensive MRSA colonization surveillance program. It is not intended to diagnose MRSA infection nor to guide or monitor treatment for MRSA infections. Performed at [facility], [place] [address] [place], [place] [address]   Culture, blood (routine x 2)     Status: None (Preliminary result)   Collection Time: 01/23/19 11:31 AM  Result Value Ref Range Status   Specimen Description BLOOD RIGHT THUMB  Final   Special Requests   Final    BOTTLES DRAWN AEROBIC ONLY Blood Culture adequate volume Performed at [facility], [place] [address] [place], [place] [address]    Culture NO GROWTH 2 DAYS  Final   Report Status PENDING  Incomplete  Culture, blood (routine x 2)     Status: None (Preliminary result)   Collection Time: 01/23/19 11:37 AM  Result Value Ref Range Status   Specimen Description BLOOD LEFT HAND  Final   Special Requests   Final    BOTTLES DRAWN AEROBIC ONLY Blood Culture results may not be optimal due to an inadequate volume of blood received in culture bottles Performed at [facility] [address] [place], [place] [address]    Culture NO GROWTH 2 DAYS  Final   Report Status PENDING  Incomplete    RADIOLOGY STUDIES/RESULTS: Dg Chest Port 1 View  Result Date: 01/21/2019 CLINICAL DATA:  Altered mental status/nonresponsive EXAM: PORTABLE CHEST 1 VIEW COMPARISON:  Chest radiograph December 06, 2018 and chest CT December 07, 2018 FINDINGS: There is no appreciable edema or consolidation. Heart size and pulmonary vascularity are normal. No adenopathy. Patient is undergone previous mid lower thoracic kyphoplasty procedures. There is aortic atherosclerosis. Bones are osteoporotic. IMPRESSION: No edema or consolidation.  Stable cardiac silhouette. Aortic Atherosclerosis (ICD10-I70.0). Electronically Signed   By: Lowella Grip III M.D.   On: 01/21/2019 13:36   [place] Ekg Site Rite  Result Date: 01/26/2019 If Site Rite image not attached, placement could not be confirmed due to current cardiac rhythm.    LOS: 5 days   Oren Binet, MD  [facility]  If 7PM-7AM, please contact night-coverage  Please page via [email or web address]  Go to [email or web address] and use [facility]'s universal password to access. If you do not have the password, please contact the hospital operator.  Locate the [facility] provider you are looking for under [facility] and  page to a number that you can be directly reached. If you still have difficulty reaching the provider, please page the [facility] (Director on Call) for the Hospitalists listed on amion for assistance.  01/26/2019, 11:41 AM

## 2019-01-26 NOTE — Progress Notes (Signed)
Peripherally Inserted Central Catheter/Midline Placement  The IV Nurse has discussed with the patient and/or persons authorized to consent for the patient, the purpose of this procedure and the potential benefits and risks involved with this procedure.  The benefits include less needle sticks, lab draws from the catheter, and the patient may be discharged home with the catheter. Risks include, but not limited to, infection, bleeding, blood clot (thrombus formation), and puncture of an artery; nerve damage and irregular heartbeat and possibility to perform a PICC exchange if needed/ordered by physician.  Alternatives to this procedure were also discussed.  Bard Power PICC patient education guide, fact sheet on infection prevention and patient information card has been provided to patient /or left at bedside.  Consent obtained from friend due to altered mental status.  PICC inserted by Jake Samples, RN  PICC/Midline Placement Documentation  PICC Single Lumen 00/45/99 PICC Right Basilic 39 cm 0 cm (Active)  Indication for Insertion or Continuance of Line Poor Vasculature-patient has had multiple peripheral attempts or PIVs lasting less than 24 hours 01/26/2019 10:41 AM  Exposed Catheter (cm) 0 cm 01/26/2019 10:41 AM  Site Assessment Clean;Dry;Intact 01/26/2019 10:41 AM  Line Status Flushed;Saline locked;Blood return noted 01/26/2019 10:41 AM  Dressing Type Transparent 01/26/2019 10:41 AM  Dressing Status Clean;Dry;Intact 01/26/2019 10:41 AM  Dressing Intervention New dressing 01/26/2019 10:41 AM  Dressing Change Due 02/02/19 01/26/2019 10:41 AM       Caillou Minus, Nicolette Bang 01/26/2019, 10:43 AM

## 2019-01-26 NOTE — Progress Notes (Signed)
Phlebotomy unable to obtain labs this morning and current IV red and infiltrated. Recommended a PICC line to Dr. Sloan Leiter who agrees. RN made aware and to remove PIV.

## 2019-01-26 NOTE — Progress Notes (Signed)
    [facility] for Infectious Disease    Date of Admission:  01/21/2019   Total days of antibiotics 5           ID: [name] is a 73 y.o. female with sepsis, AMS found to have staph epi bacteremia and ESBL uti. Principal Problem:   Hypovolemic shock (HCC) Active Problems:   Dehydration   HTN (hypertension)   Uncontrolled type 2 diabetes mellitus with hyperglycemia (HCC)   Acute metabolic encephalopathy   Physical deconditioning    Subjective: Afebrile, more alert, needs help with eating her meals, feeling weak.   Medications:  . feeding supplement (GLUCERNA SHAKE)  237 mL Oral TID BM  . heparin  5,000 Units Subcutaneous Q8H  . insulin aspart  0-5 Units Subcutaneous QHS  . insulin aspart  0-9 Units Subcutaneous TID WC  . metoprolol tartrate  5 mg Intravenous Q6H  . sodium chloride flush  3 mL Intravenous Q12H  . tamsulosin  0.4 mg Oral Daily    Objective: Vital signs in last 24 hours: Temp:  [97.5 F (36.4 C)-98.5 F (36.9 C)] 97.5 F (36.4 C) (02/29 1215) Pulse Rate:  [85-129] 89 (02/29 1200) Resp:  [15-31] 15 (02/29 1200) BP: (106-138)/(54-87) 106/65 (02/29 1215) SpO2:  [95 %-100 %] 95 % (02/29 1200) Weight:  [102.1 kg] 102.1 kg (02/29 0500)  Physical Exam  Constitutional:  oriented to person, place, and time. appears well-developed and well-nourished. No distress.  HENT: [place]/AT, PERRLA, no scleral icterus Mouth/Throat: Oropharynx is dry mouth, but no exudate Cardiovascular: Normal rate, regular rhythm and normal heart sounds. Exam reveals no gallop and no friction rub.  No murmur heard.  Pulmonary/Chest: Effort normal and breath sounds normal. No respiratory distress.  has no wheezes.  Neck = supple, no nuchal rigidity Abdominal: Soft. Bowel sounds are normal.  exhibits no distension. There is no tenderness.  Lymphadenopathy: no cervical adenopathy. No axillary adenopathy Neurological: alert and oriented to person, place, and time.  Skin: Skin is  warm and dry. No rash noted. No erythema.  Ext: pitting edema Psychiatric: a normal mood and affect.  behavior is normal.    Lab Results Recent Labs    01/25/19 0439 01/26/19 1109  NA 149* 146*  K 2.8* 3.4*  CL 107 106  CO2 31 32  BUN 48* 31*  CREATININE 1.03* 0.75    Microbiology: 2/26 blood cx ngtd 2/24 urine cx esbl 2/24 blood cx staph epi oxa R Studies/Results: [place] Ekg Site Rite  Result Date: 01/26/2019 If Site Rite image not attached, placement could not be confirmed due to current cardiac rhythm.    Assessment/Plan: Staph epi bacteremia = treat for 2 wk with vancomycin using 2/26 as day 1 through march 10  Proteus uti = treat for 5 days with meropenem  Deconditioned = continue with ot/pt  [facility] Cell: [phone] Pager: [phone]  01/26/2019, 1:49 PM

## 2019-01-27 LAB — BASIC METABOLIC PANEL
Anion gap: 5 (ref 5–15)
BUN: 27 mg/dL — AB (ref 8–23)
CO2: 31 mmol/L (ref 22–32)
Calcium: 8.3 mg/dL — ABNORMAL LOW (ref 8.9–10.3)
Chloride: 105 mmol/L (ref 98–111)
Creatinine, Ser: 0.75 mg/dL (ref 0.44–1.00)
GFR calc Af Amer: >60 mL/min (ref >60)
GFR calc non Af Amer: >60 mL/min (ref >60)
GLUCOSE: 93 mg/dL (ref 70–99)
Potassium: 3.8 mmol/L (ref 3.5–5.1)
Sodium: 141 mmol/L (ref 135–145)

## 2019-01-27 LAB — CBC
HEMATOCRIT: 33.6 % — AB (ref 36.0–46.0)
Hemoglobin: 10.7 g/dL — ABNORMAL LOW (ref 12.0–15.0)
MCH: 30.6 pg (ref 26.0–34.0)
MCHC: 31.8 g/dL (ref 30.0–36.0)
MCV: 96 fL (ref 80.0–100.0)
Platelets: 124 10*3/uL — ABNORMAL LOW (ref 150–400)
RBC: 3.5 MIL/uL — ABNORMAL LOW (ref 3.87–5.11)
RDW: 14.3 % (ref 11.5–15.5)
WBC: 7.4 10*3/uL (ref 4.0–10.5)
nRBC: 0 % (ref 0.0–0.2)

## 2019-01-27 LAB — GLUCOSE, CAPILLARY
Glucose-Capillary: 87 mg/dL (ref 70–99)
Glucose-Capillary: 99 mg/dL (ref 70–99)
Glucose-Capillary: 123 mg/dL — ABNORMAL HIGH (ref 70–99)
Glucose-Capillary: 85 mg/dL (ref 70–99)

## 2019-01-27 LAB — MAGNESIUM: Magnesium: 1.8 mg/dL (ref 1.7–2.4)

## 2019-01-27 MED ORDER — LOPERAMIDE HCL 2 MG PO CAPS
2.0000 mg | ORAL_CAPSULE | Freq: Four times a day (QID) | ORAL | Status: DC | PRN
  Administered 2019-01-27 – 2019-02-04 (×3): 2 mg via ORAL
  Filled 2019-01-27 (×3): qty 1

## 2019-01-27 NOTE — Progress Notes (Signed)
PROGRESS NOTE        PATIENT DETAILS Name: [name] Age: 73 y.o. Sex: female Date of Birth: [birth year] Admit Date: 01/21/2019 Admitting Physician Modena Jansky, MD EUM:[id number], [name], MD  Brief Narrative: Patient is a 73 y.o. female with prior history of CAD status post PCI in 4315, chronic diastolic heart failure, DM-2, hypertension, dyslipidemia presenting with acute metabolic encephalopathy secondary to acute kidney injury and sepsis with hypotension from gram-positive bacteremia.  See below for further details  Subjective:  Patient in bed, appears comfortable, denies any headache, no fever, no chest pain or pressure, no shortness of breath , no abdominal pain. No focal weakness.    Assessment/Plan:  Sepsis with hypotension secondary to staph epidermidis bacteremia and ESBL Proteus UTI: She had 3 out of 4 bottles positive for staph epidermidis, urinary retention with ESBL Proteus UTI.  Seen by ID.  Currently on vancomycin and meropenem.  Sepsis pathophysiology has resolved, repeat blood cultures drawn on 01/23/2019 are so far negative.  Echo done 12/08/2018 unremarkable.  Per ID total 10 days of IV vancomycin with stop date being 02/02/2019 2010 days from the last negative cultures, meropenem stop date 01/28/2019.  Acute renal failure: Due to sepsis along with home use of ACE inhibitor, NSAIDs, resolved after hydration, continue to hold offending medications.    Anion gap metabolic acidosis: To #1 and #2 above.  Resolved.  Acute metabolic encephalopathy: Due to sepsis and renal failure, much improved, nonfocal exam.  Continue supportive care.  Minimize narcotic and benzodiazepine use.  CAD s/p PCI 2014: Supportive care-no obvious anginal symptoms present.  Hypertension: Blood pressure controlled low-dose metoprolol  Runs of narrow complex tachycardia/SVT: Stable with low-dose metoprolol.   Chronic diastolic heart failure: Volume status stable-monitor  closely while on IV fluids.  Continue to hold diuretics for now.  Insulin-dependent DM-2: CBG stable-continue SSI.  Continue to hold oral hypoglycemic agents and Actos for now.  Large multinodular goiter: Stable for outpatient follow-up-this is a chronic issue.  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  Dysphagia: On full liquid diet-speech therapy following.  Mild antibiotic induced diarrhea.  No fever or leukocytosis.  Imodium.    DVT Prophylaxis: Prophylactic Heparin   Code Status: DNR  Family Communication: None at bedside  Disposition Plan: Remain inpatient SNF on discharge-likely next week  Antimicrobial agents: Anti-infectives (From admission, onward)   Start     Dose/Rate Route Frequency Ordered Stop   01/25/19 1500  vancomycin (VANCOCIN) IVPB 1000 mg/200 mL premix     1,000 mg 200 mL/hr over 60 Minutes Intravenous Every 24 hours 01/25/19 0947     01/25/19 1400  meropenem (MERREM) 1 g in sodium chloride 0.9 % 100 mL IVPB     1 g 200 mL/hr over 30 Minutes Intravenous Every 8 hours 01/25/19 0947 01/28/19 2159   01/24/19 1500  vancomycin (VANCOCIN) 1,500 mg in sodium chloride 0.9 % 500 mL IVPB  Status:  Discontinued     1,500 mg 250 mL/hr over 120 Minutes Intravenous Every 24 hours 01/24/19 1403 01/25/19 0947   01/23/19 2330  meropenem (MERREM) 1 g in sodium chloride 0.9 % 100 mL IVPB  Status:  Discontinued     1 g 200 mL/hr over 30 Minutes Intravenous Every 12 hours 01/23/19 2321 01/25/19 0947   01/22/19 1200  ceFEPIme (MAXIPIME) 1  g in sodium chloride 0.9 % 100 mL IVPB  Status:  Discontinued     1 g 200 mL/hr over 30 Minutes Intravenous Every 24 hours 01/21/19 1446 01/22/19 0956   01/21/19 1446  vancomycin variable dose per unstable renal function (pharmacist dosing)  Status:  Discontinued      Does not apply See admin instructions 01/21/19 1446 01/22/19 0941   01/21/19 1415  ceFEPIme (MAXIPIME) 2 g in sodium  chloride 0.9 % 100 mL IVPB     2 g 200 mL/hr over 30 Minutes Intravenous  Once 01/21/19 1403 01/21/19 1449   01/21/19 1415  vancomycin (VANCOCIN) IVPB 1000 mg/200 mL premix  Status:  Discontinued     1,000 mg 200 mL/hr over 60 Minutes Intravenous  Once 01/21/19 1403 01/21/19 1411   01/21/19 1415  vancomycin (VANCOCIN) 2,500 mg in sodium chloride 0.9 % 500 mL IVPB     2,500 mg 250 mL/hr over 120 Minutes Intravenous  Once 01/21/19 1411 01/21/19 1937      Procedures: None  CONSULTS:  None  Time spent: 25 minutes-Greater than 50% of this time was spent in counseling, explanation of diagnosis, planning of further management, and coordination of care.  MEDICATIONS: Scheduled Meds: . feeding supplement (GLUCERNA SHAKE)  237 mL Oral TID BM  . heparin  5,000 Units Subcutaneous Q8H  . insulin aspart  0-5 Units Subcutaneous QHS  . insulin aspart  0-9 Units Subcutaneous TID WC  . metoprolol tartrate  5 mg Intravenous Q6H  . potassium chloride  40 mEq Oral Daily  . sodium chloride flush  3 mL Intravenous Q12H  . tamsulosin  0.4 mg Oral Daily   Continuous Infusions: . meropenem (MERREM) IV 1 g (01/27/19 0533)  . vancomycin 1,000 mg (01/26/19 1630)   PRN Meds:.acetaminophen **OR** [DISCONTINUED] acetaminophen, albuterol, loperamide, ondansetron (ZOFRAN) IV, senna-docusate, sodium chloride flush   PHYSICAL EXAM: Vital signs: Vitals:   01/26/19 1949 01/27/19 0033 01/27/19 0500 01/27/19 0806  BP:  (!) 125/105  (!) 107/59  Pulse:  99  97  Resp: 20 16  17   Temp:  97.8 F (36.6 C)  97.7 F (36.5 C)  TempSrc:  Oral  Oral  SpO2:  98%  97%  Weight:   101 kg   Height:       Filed Weights   01/25/19 0551 01/26/19 0500 01/27/19 0500  Weight: 101.7 kg 102.1 kg 101 kg   Body mass index is 33.36 kg/m.   Exam  Awake Alert,  No new F.N deficits,   [place].AT,PERRAL Supple Neck,No JVD, No cervical lymphadenopathy appriciated.  Symmetrical Chest wall movement, Good air movement  bilaterally, CTAB RRR,No Gallops, Rubs or new Murmurs, No Parasternal Heave +ve B.Sounds, Abd Soft, No tenderness, No organomegaly appriciated, No rebound - guarding or rigidity. No Cyanosis, Clubbing or edema, foley, bilat heal bandages   I have personally reviewed following labs and imaging studies  LABORATORY DATA: CBC: Recent Labs  Lab 01/21/19 1309 01/22/19 0612 01/23/19 0351 01/27/19 0334  WBC 12.3* 9.8 7.0 7.4  NEUTROABS 10.0*  --   --   --   HGB 13.8 12.9 13.1 10.7*  HCT 42.3 40.0 37.3 33.6*  MCV 91.4 93.2 87.6 96.0  PLT 180 149* 104* 124*    Basic Metabolic Panel: Recent Labs  Lab 01/23/19 0351 01/24/19 0444 01/25/19 0439 01/26/19 1109 01/27/19 0334  NA 140 149* 149* 146* 141  K 3.8 2.3* 2.8* 3.4* 3.8  CL 106 104 107 106 105  CO2 15* 30  31 32 31  GLUCOSE 170* 121* 156* 122* 93  BUN 110* 70* 48* 31* 27*  CREATININE 2.54* 1.24* 1.03* 0.75 0.75  CALCIUM 7.5* 7.7* 7.7* 8.4* 8.3*  MG  --   --  1.4* 2.1 1.8    GFR: Estimated Creatinine Clearance: 77.4 mL/min (by C-G formula based on SCr of 0.75 mg/dL).  Liver Function Tests: Recent Labs  Lab 01/21/19 1309 01/23/19 0351  AST 23 18  ALT 33 QUANTITY NOT SUFFICIENT, UNABLE TO PERFORM TEST  ALKPHOS 59 45  BILITOT 0.9 QUANTITY NOT SUFFICIENT, UNABLE TO PERFORM TEST  PROT 6.7 5.5*  ALBUMIN 2.2* 1.8*   No results for input(s): LIPASE, AMYLASE in the last 168 hours. No results for input(s): AMMONIA in the last 168 hours.  Coagulation Profile: No results for input(s): INR, PROTIME in the last 168 hours.  Cardiac Enzymes: No results for input(s): CKTOTAL, CKMB, CKMBINDEX, TROPONINI in the last 168 hours.  BNP (last 3 results) No results for input(s): PROBNP in the last 8760 hours.  HbA1C: No results for input(s): HGBA1C in the last 72 hours.  CBG: Recent Labs  Lab 01/26/19 0753 01/26/19 1229 01/26/19 1657 01/26/19 2120 01/27/19 0817  GLUCAP 103* 113* 115* 123* 87    Lipid Profile: No  results for input(s): CHOL, HDL, LDLCALC, TRIG, CHOLHDL, LDLDIRECT in the last 72 hours.  Thyroid Function Tests: No results for input(s): TSH, T4TOTAL, FREET4, T3FREE, THYROIDAB in the last 72 hours.  Anemia Panel: No results for input(s): VITAMINB12, FOLATE, FERRITIN, TIBC, IRON, RETICCTPCT in the last 72 hours.  Urine analysis:    Component Value Date/Time   COLORURINE YELLOW 01/21/2019 1620   APPEARANCEUR TURBID (A) 01/21/2019 1620   LABSPEC 1.020 01/21/2019 1620   PHURINE 8.0 01/21/2019 1620   GLUCOSEU NEGATIVE 01/21/2019 1620   HGBUR MODERATE (A) 01/21/2019 1620   BILIRUBINUR NEGATIVE 01/21/2019 1620   KETONESUR NEGATIVE 01/21/2019 1620   PROTEINUR 100 (A) 01/21/2019 1620   UROBILINOGEN 1.0 06/28/2015 1552   NITRITE NEGATIVE 01/21/2019 1620   LEUKOCYTESUR LARGE (A) 01/21/2019 1620    Sepsis Labs: Lactic Acid, Venous    Component Value Date/Time   LATICACIDVEN 2.5 (HH) 01/21/2019 1718    MICROBIOLOGY: Recent Results (from the past 240 hour(s))  Blood Culture (routine x 2)     Status: Abnormal   Collection Time: 01/21/19  1:35 PM  Result Value Ref Range Status   Specimen Description BLOOD RIGHT ANTECUBITAL  Final   Special Requests   Final    BOTTLES DRAWN AEROBIC AND ANAEROBIC Blood Culture adequate volume   Culture  Setup Time   Final    GRAM POSITIVE COCCI IN BOTH AEROBIC AND ANAEROBIC BOTTLES CRITICAL RESULT CALLED TO, READ BACK BY AND VERIFIED WITH: Karlene Einstein PharmD 11:00 01/22/19 ([email or web address]) Performed at [facility], [place] [address], [place], [place] [address]    Culture STAPHYLOCOCCUS EPIDERMIDIS (A)  Final   Report Status 01/24/2019 FINAL  Final   Organism ID, Bacteria STAPHYLOCOCCUS EPIDERMIDIS  Final      Susceptibility   Staphylococcus epidermidis - MIC*    CIPROFLOXACIN >=8 RESISTANT Resistant     ERYTHROMYCIN >=8 RESISTANT Resistant     GENTAMICIN <=0.5 SENSITIVE Sensitive     OXACILLIN >=4 RESISTANT Resistant     TETRACYCLINE 2 SENSITIVE  Sensitive     VANCOMYCIN 1 SENSITIVE Sensitive     TRIMETH/SULFA 80 RESISTANT Resistant     CLINDAMYCIN >=8 RESISTANT Resistant     RIFAMPIN <=0.5 SENSITIVE Sensitive  Inducible Clindamycin NEGATIVE Sensitive     * STAPHYLOCOCCUS EPIDERMIDIS  Blood Culture (routine x 2)     Status: Abnormal   Collection Time: 01/21/19  1:35 PM  Result Value Ref Range Status   Specimen Description BLOOD RIGHT ANTECUBITAL  Final   Special Requests   Final    BOTTLES DRAWN AEROBIC AND ANAEROBIC Blood Culture adequate volume   Culture  Setup Time   Final    GRAM POSITIVE COCCI ANAEROBIC BOTTLE ONLY CRITICAL VALUE NOTED.  VALUE IS CONSISTENT WITH PREVIOUSLY REPORTED AND CALLED VALUE.    Culture (A)  Final    STAPHYLOCOCCUS EPIDERMIDIS SUSCEPTIBILITIES PERFORMED ON PREVIOUS CULTURE WITHIN THE LAST 5 DAYS. Performed at [facility], [place] [address], [place], [place] [address]    Report Status 01/24/2019 FINAL  Final  Blood Culture ID Panel (Reflexed)     Status: Abnormal   Collection Time: 01/21/19  1:35 PM  Result Value Ref Range Status   Enterococcus species NOT DETECTED NOT DETECTED Final   Listeria monocytogenes NOT DETECTED NOT DETECTED Final   Staphylococcus species DETECTED (A) NOT DETECTED Final    Comment: Methicillin (oxacillin) resistant coagulase negative staphylococcus. Possible blood culture contaminant (unless isolated from more than one blood culture draw or clinical case suggests pathogenicity). No antibiotic treatment is indicated for blood  culture contaminants. CRITICAL RESULT CALLED TO, READ BACK BY AND VERIFIED WITH: Karlene Einstein PharmD 11:00 01/22/19 ([email or web address])    Staphylococcus aureus (BCID) NOT DETECTED NOT DETECTED Final   Methicillin resistance DETECTED (A) NOT DETECTED Final    Comment: CRITICAL RESULT CALLED TO, READ BACK BY AND VERIFIED WITH: Karlene Einstein PharmD 11:00 01/22/19 ([email or web address])    Streptococcus species NOT DETECTED NOT DETECTED Final   Streptococcus  agalactiae NOT DETECTED NOT DETECTED Final   Streptococcus pneumoniae NOT DETECTED NOT DETECTED Final   Streptococcus pyogenes NOT DETECTED NOT DETECTED Final   Acinetobacter baumannii NOT DETECTED NOT DETECTED Final   Enterobacteriaceae species NOT DETECTED NOT DETECTED Final   Enterobacter cloacae complex NOT DETECTED NOT DETECTED Final   Escherichia coli NOT DETECTED NOT DETECTED Final   Klebsiella oxytoca NOT DETECTED NOT DETECTED Final   Klebsiella pneumoniae NOT DETECTED NOT DETECTED Final   Proteus species NOT DETECTED NOT DETECTED Final   Serratia marcescens NOT DETECTED NOT DETECTED Final   Haemophilus influenzae NOT DETECTED NOT DETECTED Final   Neisseria meningitidis NOT DETECTED NOT DETECTED Final   Pseudomonas aeruginosa NOT DETECTED NOT DETECTED Final   Candida albicans NOT DETECTED NOT DETECTED Final   Candida glabrata NOT DETECTED NOT DETECTED Final   Candida krusei NOT DETECTED NOT DETECTED Final   Candida parapsilosis NOT DETECTED NOT DETECTED Final   Candida tropicalis NOT DETECTED NOT DETECTED Final    Comment: Performed at [facility], [place]. [address], [place], [place] [address]  Urine culture     Status: Abnormal   Collection Time: 01/21/19  4:20 PM  Result Value Ref Range Status   Specimen Description URINE, RANDOM  Final   Special Requests NONE  Final   Culture (A)  Final    >=100,000 COLONIES/mL PROTEUS MIRABILIS Susceptibility Pattern Suggests Possibility of an Extended Spectrum Beta Lactamase Producer. Contact Laboratory Within 7 Days if Confirmation Warranted. Performed at [facility], [place] [address], [place],  [address]    Report Status 01/23/2019 FINAL  Final   Organism ID, Bacteria PROTEUS MIRABILIS (A)  Final      Susceptibility   Proteus mirabilis -  MIC*    AMPICILLIN >=32 RESISTANT Resistant     CEFAZOLIN >=64 RESISTANT Resistant     CEFTRIAXONE RESISTANT Resistant     CIPROFLOXACIN >=4 RESISTANT Resistant     GENTAMICIN <=1  SENSITIVE Sensitive     IMIPENEM 2 SENSITIVE Sensitive     NITROFURANTOIN 128 RESISTANT Resistant     TRIMETH/SULFA >=320 RESISTANT Resistant     AMPICILLIN/SULBACTAM >=32 RESISTANT Resistant     PIP/TAZO <=4 SENSITIVE Sensitive     * >=100,000 COLONIES/mL PROTEUS MIRABILIS  MRSA PCR Screening     Status: None   Collection Time: 01/21/19  8:35 PM  Result Value Ref Range Status   MRSA by PCR NEGATIVE NEGATIVE Final    Comment:        The GeneXpert MRSA Assay (FDA approved for NASAL specimens only), is one component of a comprehensive MRSA colonization surveillance program. It is not intended to diagnose MRSA infection nor to guide or monitor treatment for MRSA infections. Performed at [facility], [place] [address], [place], [place] [address]   Culture, blood (routine x 2)     Status: None (Preliminary result)   Collection Time: 01/23/19 11:31 AM  Result Value Ref Range Status   Specimen Description BLOOD RIGHT THUMB  Final   Special Requests   Final    BOTTLES DRAWN AEROBIC ONLY Blood Culture adequate volume   Culture   Final    NO GROWTH 3 DAYS Performed at [facility], [place] [address], [place], [place] [address]    Report Status PENDING  Incomplete  Culture, blood (routine x 2)     Status: None (Preliminary result)   Collection Time: 01/23/19 11:37 AM  Result Value Ref Range Status   Specimen Description BLOOD LEFT HAND  Final   Special Requests   Final    BOTTLES DRAWN AEROBIC ONLY Blood Culture results may not be optimal due to an inadequate volume of blood received in culture bottles   Culture   Final    NO GROWTH 3 DAYS Performed at [facility], [place] [address] [place],  [address]    Report Status PENDING  Incomplete    RADIOLOGY STUDIES/RESULTS: Dg Chest Port 1 View  Result Date: 01/21/2019 CLINICAL DATA:  Altered mental status/nonresponsive EXAM: PORTABLE CHEST 1 VIEW COMPARISON:  Chest radiograph December 06, 2018 and chest CT December 07, 2018 FINDINGS: There is no appreciable edema or consolidation. Heart size and pulmonary vascularity are normal. No adenopathy. Patient is undergone previous mid lower thoracic kyphoplasty procedures. There is aortic atherosclerosis. Bones are osteoporotic. IMPRESSION: No edema or consolidation.  Stable cardiac silhouette. Aortic Atherosclerosis (ICD10-I70.0). Electronically Signed   By: Lowella Grip III M.D.   On: 01/21/2019 13:36   [place] Ekg Site Rite  Result Date: 01/26/2019 If Site Rite image not attached, placement could not be confirmed due to current cardiac rhythm.    LOS: 6 days    Signature  Lala Lund M.D on 01/27/2019 at 9:46 AM   -  To page go to [email or web address]

## 2019-01-27 NOTE — Progress Notes (Signed)
  Speech Language Pathology Treatment: Dysphagia  Patient Details Name: [name] MRN: [id number] DOB: 16-Mar-1946 Today's Date: 01/27/2019 Time: [id number] SLP Time Calculation (min) (ACUTE ONLY): 15 min  Assessment / Plan / Recommendation Clinical Impression  ST follow up for therapeutic diet tolerance and possible diet advancement.  Nursing reported decreased intake but no obvious s/s of aspiration.   Chart review indicated that lungs have been clear but diminished and that the patient has been afebrile.  The patient reported she was feeling better.  Meal observation was completed using thin liquids via spoon, cup and straw, pureed material, soft solids and dry solids.  Gagging was not seen today.  Nursing reported no recent vomiting.  The patient was noted to be slow orally to masticate dry solids.  She appeared to be able to move dual textured solids more easily.  No overt s/s of aspiration were seen even given serial sips of thin liquids via a straw.  Recommend advancing diet to dysphagia 3 with thin liquids.  ST will continue to follow during acute stay.     HPI HPI: 73 yr old admitted severly obtunded and hypotensive. PMH: DM, HTN, MI. Pt recently hospitalized with nausea and UTI. This admit found to have Sepsis with hypotension secondary to staph epidermidis bacteremia and ESBL Proteus UTI, Hypokalemia, hyponatremia, acute renal failure. No recent CXR or ST notes.          SLP Plan  Continue with current plan of care       Recommendations  Diet recommendations: Dysphagia 3 (mechanical soft);Thin liquid Liquids provided via: Cup;Straw Medication Administration: Whole meds with liquid Supervision: Staff to assist with self feeding Postural Changes and/or Swallow Maneuvers: Seated upright 90 degrees;Upright 30-60 min after meal                Oral Care Recommendations: Oral care BID Follow up Recommendations: Other (comment)(TBD) SLP Visit Diagnosis: Dysphagia, unspecified  (R13.10) Plan: Continue with current plan of care       [place], [place], [place] [facility] SLP [phone]  [name] 01/27/2019, 2:50 PM

## 2019-01-28 LAB — CBC
HCT: 36.8 % (ref 36.0–46.0)
Hemoglobin: 11.4 g/dL — ABNORMAL LOW (ref 12.0–15.0)
MCH: 29.5 pg (ref 26.0–34.0)
MCHC: 31 g/dL (ref 30.0–36.0)
MCV: 95.3 fL (ref 80.0–100.0)
Platelets: 127 10*3/uL — ABNORMAL LOW (ref 150–400)
RBC: 3.86 MIL/uL — ABNORMAL LOW (ref 3.87–5.11)
RDW: 14.2 % (ref 11.5–15.5)
WBC: 7.6 10*3/uL (ref 4.0–10.5)
nRBC: 0 % (ref 0.0–0.2)

## 2019-01-28 LAB — CULTURE, BLOOD (ROUTINE X 2)
Culture: NO GROWTH
Culture: NO GROWTH
Special Requests: ADEQUATE

## 2019-01-28 LAB — BASIC METABOLIC PANEL
Anion gap: 5 (ref 5–15)
BUN: 24 mg/dL — ABNORMAL HIGH (ref 8–23)
CO2: 30 mmol/L (ref 22–32)
Calcium: 8.4 mg/dL — ABNORMAL LOW (ref 8.9–10.3)
Chloride: 106 mmol/L (ref 98–111)
Creatinine, Ser: 0.86 mg/dL (ref 0.44–1.00)
GFR calc Af Amer: >60 mL/min (ref >60)
GFR calc non Af Amer: >60 mL/min (ref >60)
Glucose, Bld: 82 mg/dL (ref 70–99)
Potassium: 4.4 mmol/L (ref 3.5–5.1)
Sodium: 141 mmol/L (ref 135–145)

## 2019-01-28 LAB — GLUCOSE, CAPILLARY
GLUCOSE-CAPILLARY: 132 mg/dL — AB (ref 70–99)
Glucose-Capillary: 67 mg/dL — ABNORMAL LOW (ref 70–99)
Glucose-Capillary: 96 mg/dL (ref 70–99)
Glucose-Capillary: 113 mg/dL — ABNORMAL HIGH (ref 70–99)

## 2019-01-28 LAB — MAGNESIUM: Magnesium: 1.6 mg/dL — ABNORMAL LOW (ref 1.7–2.4)

## 2019-01-28 MED ORDER — METOPROLOL TARTRATE 5 MG/5ML IV SOLN
INTRAVENOUS | Status: AC
  Administered 2019-01-28: 5 mg
  Filled 2019-01-28: qty 5

## 2019-01-28 MED ORDER — METOPROLOL TARTRATE 50 MG PO TABS
50.0000 mg | ORAL_TABLET | Freq: Two times a day (BID) | ORAL | Status: DC
  Administered 2019-01-28 – 2019-02-08 (×22): 50 mg via ORAL
  Filled 2019-01-28 (×23): qty 1

## 2019-01-28 MED ORDER — MAGNESIUM SULFATE IN D5W 1-5 GM/100ML-% IV SOLN
1.0000 g | Freq: Once | INTRAVENOUS | Status: AC
  Administered 2019-01-28: 1 g via INTRAVENOUS
  Filled 2019-01-28: qty 100

## 2019-01-28 MED ORDER — MAGNESIUM SULFATE 2 GM/50ML IV SOLN
2.0000 g | Freq: Once | INTRAVENOUS | Status: AC
  Administered 2019-01-28: 2 g via INTRAVENOUS
  Filled 2019-01-28: qty 50

## 2019-01-28 MED ORDER — METOPROLOL TARTRATE 5 MG/5ML IV SOLN
5.0000 mg | Freq: Four times a day (QID) | INTRAVENOUS | Status: DC | PRN
  Administered 2019-01-28 (×2): 5 mg via INTRAVENOUS
  Filled 2019-01-28 (×2): qty 5

## 2019-01-28 MED ORDER — SODIUM CHLORIDE 0.9 % IV BOLUS
1000.0000 mL | Freq: Once | INTRAVENOUS | Status: AC
  Administered 2019-01-28: 1000 mL via INTRAVENOUS

## 2019-01-28 NOTE — Progress Notes (Signed)
PROGRESS NOTE        PATIENT DETAILS Name: [name] Age: 73 y.o. Sex: female Date of Birth: [birth year] Admit Date: 01/21/2019 Admitting Physician Modena Jansky, MD HDQ:[id number], [name], MD  Brief Narrative: Patient is a 73 y.o. female with prior history of CAD status post PCI in 8921, chronic diastolic heart failure, DM-2, hypertension, dyslipidemia presenting with acute metabolic encephalopathy secondary to acute kidney injury and sepsis with hypotension from gram-positive bacteremia.  See below for further details  Subjective:  Patient in bed, appears comfortable, denies any headache, no fever, no chest pain or pressure, no shortness of breath , no abdominal pain. No focal weakness.  Assessment/Plan:  Sepsis with hypotension secondary to staph epidermidis bacteremia and ESBL Proteus UTI: She had 3 out of 4 bottles positive for staph epidermidis, urinary retention with ESBL Proteus UTI.  Seen by ID.  Currently on vancomycin and meropenem.  Sepsis pathophysiology has resolved, repeat blood cultures drawn on 01/23/2019 are so far negative.  Echo done 12/08/2018 unremarkable.  Per ID total 10 days of IV vancomycin with stop date being 02/02/2019 2010 days from the last negative cultures, meropenem stop date 01/28/2019.  Acute renal failure: Due to sepsis along with home use of ACE inhibitor, NSAIDs, resolved after hydration, continue to hold offending medications.    Anion gap metabolic acidosis: To #1 and #2 above.  Resolved.  Acute metabolic encephalopathy: Due to sepsis and renal failure, much improved, nonfocal exam.  Continue supportive care.  Minimize narcotic and benzodiazepine use.  CAD s/p PCI 2014: Supportive care-no obvious anginal symptoms present.  Hypertension: Blood pressure controlled low-dose metoprolol  Runs of narrow complex tachycardia/SVT: Stable with low-dose metoprolol.   Chronic diastolic heart failure: Volume status stable-monitor  closely while on IV fluids.  Continue to hold diuretics for now.  Large multinodular goiter: Stable for outpatient follow-up-this is a chronic issue.  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  Dysphagia: SLP following advanced to dysphagia 3 diet.  Will monitor.  Mild antibiotic induced diarrhea.  No fever or leukocytosis.  Imodium.  Bladder outlet obstruction.  Continue Flomax, Trial of Foley removal on 01/28/2019 will monitor.  Insulin-dependent DM-2: CBG stable-continue SSI.  Continue to hold oral hypoglycemic agents and Actos for now.  Lab Results  Component Value Date   HGBA1C 6.0 12/13/2018   CBG (last 3)  Recent Labs    01/27/19 1640 01/27/19 2136 01/28/19 0841  GLUCAP 123* 85 67*      DVT Prophylaxis: Prophylactic Heparin   Code Status: DNR  Family Communication: None at bedside  Disposition Plan: Remain inpatient SNF on discharge-likely next week  Antimicrobial agents: Anti-infectives (From admission, onward)   Start     Dose/Rate Route Frequency Ordered Stop   01/25/19 1500  vancomycin (VANCOCIN) IVPB 1000 mg/200 mL premix     1,000 mg 200 mL/hr over 60 Minutes Intravenous Every 24 hours 01/25/19 0947     01/25/19 1400  meropenem (MERREM) 1 g in sodium chloride 0.9 % 100 mL IVPB     1 g 200 mL/hr over 30 Minutes Intravenous Every 8 hours 01/25/19 0947 01/28/19 2159   01/24/19 1500  vancomycin (VANCOCIN) 1,500 mg in sodium chloride 0.9 % 500 mL IVPB  Status:  Discontinued     1,500 mg 250 mL/hr over 120 Minutes Intravenous Every 24  hours 01/24/19 1403 01/25/19 0947   01/23/19 2330  meropenem (MERREM) 1 g in sodium chloride 0.9 % 100 mL IVPB  Status:  Discontinued     1 g 200 mL/hr over 30 Minutes Intravenous Every 12 hours 01/23/19 2321 01/25/19 0947   01/22/19 1200  ceFEPIme (MAXIPIME) 1 g in sodium chloride 0.9 % 100 mL IVPB  Status:  Discontinued     1 g 200 mL/hr over 30 Minutes Intravenous Every  24 hours 01/21/19 1446 01/22/19 0956   01/21/19 1446  vancomycin variable dose per unstable renal function (pharmacist dosing)  Status:  Discontinued      Does not apply See admin instructions 01/21/19 1446 01/22/19 0941   01/21/19 1415  ceFEPIme (MAXIPIME) 2 g in sodium chloride 0.9 % 100 mL IVPB     2 g 200 mL/hr over 30 Minutes Intravenous  Once 01/21/19 1403 01/21/19 1449   01/21/19 1415  vancomycin (VANCOCIN) IVPB 1000 mg/200 mL premix  Status:  Discontinued     1,000 mg 200 mL/hr over 60 Minutes Intravenous  Once 01/21/19 1403 01/21/19 1411   01/21/19 1415  vancomycin (VANCOCIN) 2,500 mg in sodium chloride 0.9 % 500 mL IVPB     2,500 mg 250 mL/hr over 120 Minutes Intravenous  Once 01/21/19 1411 01/21/19 1937      Procedures: None  CONSULTS:  None  Time spent: 25 minutes-Greater than 50% of this time was spent in counseling, explanation of diagnosis, planning of further management, and coordination of care.  MEDICATIONS: Scheduled Meds: . feeding supplement (GLUCERNA SHAKE)  237 mL Oral TID BM  . heparin  5,000 Units Subcutaneous Q8H  . insulin aspart  0-5 Units Subcutaneous QHS  . insulin aspart  0-9 Units Subcutaneous TID WC  . metoprolol tartrate  50 mg Oral BID  . potassium chloride  40 mEq Oral Daily  . sodium chloride flush  3 mL Intravenous Q12H  . tamsulosin  0.4 mg Oral Daily   Continuous Infusions: . magnesium sulfate 1 - 4 g bolus IVPB    . magnesium sulfate 1 - 4 g bolus IVPB    . meropenem (MERREM) IV 1 g (01/28/19 0535)  . vancomycin 1,000 mg (01/27/19 1442)   PRN Meds:.acetaminophen **OR** [DISCONTINUED] acetaminophen, albuterol, loperamide, metoprolol tartrate, ondansetron (ZOFRAN) IV, senna-docusate   PHYSICAL EXAM: Vital signs: Vitals:   01/27/19 2135 01/28/19 0545 01/28/19 0850 01/28/19 0906  BP: 103/64   (!) 116/42  Pulse: (!) 117   97  Resp: 18     Temp: 98.2 F (36.8 C)  98.1 F (36.7 C)   TempSrc: Oral  Oral   SpO2: 95%     Weight:   103.8 kg    Height:       Filed Weights   01/26/19 0500 01/27/19 0500 01/28/19 0545  Weight: 102.1 kg 101 kg 103.8 kg   Body mass index is 34.29 kg/m.   Exam  Awake Alert, Oriented X 3, No new F.N deficits, Normal affect [place].AT,PERRAL Supple Neck,No JVD, No cervical lymphadenopathy appriciated.  Symmetrical Chest wall movement, Good air movement bilaterally, CTAB RRR,No Gallops, Rubs or new Murmurs, No Parasternal Heave +ve B.Sounds, Abd Soft, No tenderness, No organomegaly appriciated, No rebound - guarding or rigidity. No Cyanosis, Clubbing or edema, foley, bilat heal bandages   I have personally reviewed following labs and imaging studies  LABORATORY DATA: CBC: Recent Labs  Lab 01/21/19 1309 01/22/19 0612 01/23/19 0351 01/27/19 0334 01/28/19 0405  WBC 12.3* 9.8 7.0 7.4 7.6  NEUTROABS 10.0*  --   --   --   --   HGB 13.8 12.9 13.1 10.7* 11.4*  HCT 42.3 40.0 37.3 33.6* 36.8  MCV 91.4 93.2 87.6 96.0 95.3  PLT 180 149* 104* 124* 127*    Basic Metabolic Panel: Recent Labs  Lab 01/24/19 0444 01/25/19 0439 01/26/19 1109 01/27/19 0334 01/28/19 0405  NA 149* 149* 146* 141 141  K 2.3* 2.8* 3.4* 3.8 4.4  CL 104 107 106 105 106  CO2 30 31 32 31 30  GLUCOSE 121* 156* 122* 93 82  BUN 70* 48* 31* 27* 24*  CREATININE 1.24* 1.03* 0.75 0.75 0.86  CALCIUM 7.7* 7.7* 8.4* 8.3* 8.4*  MG  --  1.4* 2.1 1.8 1.6*    GFR: Estimated Creatinine Clearance: 73 mL/min (by C-G formula based on SCr of 0.86 mg/dL).  Liver Function Tests: Recent Labs  Lab 01/21/19 1309 01/23/19 0351  AST 23 18  ALT 33 QUANTITY NOT SUFFICIENT, UNABLE TO PERFORM TEST  ALKPHOS 59 45  BILITOT 0.9 QUANTITY NOT SUFFICIENT, UNABLE TO PERFORM TEST  PROT 6.7 5.5*  ALBUMIN 2.2* 1.8*   No results for input(s): LIPASE, AMYLASE in the last 168 hours. No results for input(s): AMMONIA in the last 168 hours.  Coagulation Profile: No results for input(s): INR, PROTIME in the last 168 hours.  Cardiac  Enzymes: No results for input(s): CKTOTAL, CKMB, CKMBINDEX, TROPONINI in the last 168 hours.  BNP (last 3 results) No results for input(s): PROBNP in the last 8760 hours.  HbA1C: No results for input(s): HGBA1C in the last 72 hours.  CBG: Recent Labs  Lab 01/27/19 0817 01/27/19 1238 01/27/19 1640 01/27/19 2136 01/28/19 0841  GLUCAP 87 99 123* 85 67*    Lipid Profile: No results for input(s): CHOL, HDL, LDLCALC, TRIG, CHOLHDL, LDLDIRECT in the last 72 hours.  Thyroid Function Tests: No results for input(s): TSH, T4TOTAL, FREET4, T3FREE, THYROIDAB in the last 72 hours.  Anemia Panel: No results for input(s): VITAMINB12, FOLATE, FERRITIN, TIBC, IRON, RETICCTPCT in the last 72 hours.  Urine analysis:    Component Value Date/Time   COLORURINE YELLOW 01/21/2019 1620   APPEARANCEUR TURBID (A) 01/21/2019 1620   LABSPEC 1.020 01/21/2019 1620   PHURINE 8.0 01/21/2019 1620   GLUCOSEU NEGATIVE 01/21/2019 1620   HGBUR MODERATE (A) 01/21/2019 1620   BILIRUBINUR NEGATIVE 01/21/2019 1620   KETONESUR NEGATIVE 01/21/2019 1620   PROTEINUR 100 (A) 01/21/2019 1620   UROBILINOGEN 1.0 06/28/2015 1552   NITRITE NEGATIVE 01/21/2019 1620   LEUKOCYTESUR LARGE (A) 01/21/2019 1620    Sepsis Labs: Lactic Acid, Venous    Component Value Date/Time   LATICACIDVEN 2.5 (HH) 01/21/2019 1718    MICROBIOLOGY: Recent Results (from the past 240 hour(s))  Blood Culture (routine x 2)     Status: Abnormal   Collection Time: 01/21/19  1:35 PM  Result Value Ref Range Status   Specimen Description BLOOD RIGHT ANTECUBITAL  Final   Special Requests   Final    BOTTLES DRAWN AEROBIC AND ANAEROBIC Blood Culture adequate volume   Culture  Setup Time   Final    GRAM POSITIVE COCCI IN BOTH AEROBIC AND ANAEROBIC BOTTLES CRITICAL RESULT CALLED TO, READ BACK BY AND VERIFIED WITH: Karlene Einstein PharmD 11:00 01/22/19 ([email or web address]) Performed at [facility], [place] [address], [place], [place] [address]     Culture STAPHYLOCOCCUS EPIDERMIDIS (A)  Final   Report Status 01/24/2019 FINAL  Final   Organism ID, Bacteria STAPHYLOCOCCUS EPIDERMIDIS  Final      Susceptibility   Staphylococcus epidermidis - MIC*    CIPROFLOXACIN >=8 RESISTANT Resistant     ERYTHROMYCIN >=8 RESISTANT Resistant     GENTAMICIN <=0.5 SENSITIVE Sensitive     OXACILLIN >=4 RESISTANT Resistant     TETRACYCLINE 2 SENSITIVE Sensitive     VANCOMYCIN 1 SENSITIVE Sensitive     TRIMETH/SULFA 80 RESISTANT Resistant     CLINDAMYCIN >=8 RESISTANT Resistant     RIFAMPIN <=0.5 SENSITIVE Sensitive     Inducible Clindamycin NEGATIVE Sensitive     * STAPHYLOCOCCUS EPIDERMIDIS  Blood Culture (routine x 2)     Status: Abnormal   Collection Time: 01/21/19  1:35 PM  Result Value Ref Range Status   Specimen Description BLOOD RIGHT ANTECUBITAL  Final   Special Requests   Final    BOTTLES DRAWN AEROBIC AND ANAEROBIC Blood Culture adequate volume   Culture  Setup Time   Final    GRAM POSITIVE COCCI ANAEROBIC BOTTLE ONLY CRITICAL VALUE NOTED.  VALUE IS CONSISTENT WITH PREVIOUSLY REPORTED AND CALLED VALUE.    Culture (A)  Final    STAPHYLOCOCCUS EPIDERMIDIS SUSCEPTIBILITIES PERFORMED ON PREVIOUS CULTURE WITHIN THE LAST 5 DAYS. Performed at [facility], [place] [address], [place], [place] [address]    Report Status 01/24/2019 FINAL  Final  Blood Culture ID Panel (Reflexed)     Status: Abnormal   Collection Time: 01/21/19  1:35 PM  Result Value Ref Range Status   Enterococcus species NOT DETECTED NOT DETECTED Final   Listeria monocytogenes NOT DETECTED NOT DETECTED Final   Staphylococcus species DETECTED (A) NOT DETECTED Final    Comment: Methicillin (oxacillin) resistant coagulase negative staphylococcus. Possible blood culture contaminant (unless isolated from more than one blood culture draw or clinical case suggests pathogenicity). No antibiotic treatment is indicated for blood  culture contaminants. CRITICAL RESULT CALLED TO,  READ BACK BY AND VERIFIED WITH: Karlene Einstein PharmD 11:00 01/22/19 ([email or web address])    Staphylococcus aureus (BCID) NOT DETECTED NOT DETECTED Final   Methicillin resistance DETECTED (A) NOT DETECTED Final    Comment: CRITICAL RESULT CALLED TO, READ BACK BY AND VERIFIED WITH: Karlene Einstein PharmD 11:00 01/22/19 ([email or web address])    Streptococcus species NOT DETECTED NOT DETECTED Final   Streptococcus agalactiae NOT DETECTED NOT DETECTED Final   Streptococcus pneumoniae NOT DETECTED NOT DETECTED Final   Streptococcus pyogenes NOT DETECTED NOT DETECTED Final   Acinetobacter baumannii NOT DETECTED NOT DETECTED Final   Enterobacteriaceae species NOT DETECTED NOT DETECTED Final   Enterobacter cloacae complex NOT DETECTED NOT DETECTED Final   Escherichia coli NOT DETECTED NOT DETECTED Final   Klebsiella oxytoca NOT DETECTED NOT DETECTED Final   Klebsiella pneumoniae NOT DETECTED NOT DETECTED Final   Proteus species NOT DETECTED NOT DETECTED Final   Serratia marcescens NOT DETECTED NOT DETECTED Final   Haemophilus influenzae NOT DETECTED NOT DETECTED Final   Neisseria meningitidis NOT DETECTED NOT DETECTED Final   Pseudomonas aeruginosa NOT DETECTED NOT DETECTED Final   Candida albicans NOT DETECTED NOT DETECTED Final   Candida glabrata NOT DETECTED NOT DETECTED Final   Candida krusei NOT DETECTED NOT DETECTED Final   Candida parapsilosis NOT DETECTED NOT DETECTED Final   Candida tropicalis NOT DETECTED NOT DETECTED Final    Comment: Performed at [facility], [place]. [address], [place], [place] [address]  Urine culture     Status: Abnormal   Collection Time: 01/21/19  4:20 PM  Result Value Ref Range Status  Specimen Description URINE, RANDOM  Final   Special Requests NONE  Final   Culture (A)  Final    >=100,000 COLONIES/mL PROTEUS MIRABILIS Susceptibility Pattern Suggests Possibility of an Extended Spectrum Beta Lactamase Producer. Contact Laboratory Within 7 Days if Confirmation  Warranted. Performed at [facility], [place] [address] [place], [place] [address]    Report Status 01/23/2019 FINAL  Final   Organism ID, Bacteria PROTEUS MIRABILIS (A)  Final      Susceptibility   Proteus mirabilis - MIC*    AMPICILLIN >=32 RESISTANT Resistant     CEFAZOLIN >=64 RESISTANT Resistant     CEFTRIAXONE RESISTANT Resistant     CIPROFLOXACIN >=4 RESISTANT Resistant     GENTAMICIN <=1 SENSITIVE Sensitive     IMIPENEM 2 SENSITIVE Sensitive     NITROFURANTOIN 128 RESISTANT Resistant     TRIMETH/SULFA >=320 RESISTANT Resistant     AMPICILLIN/SULBACTAM >=32 RESISTANT Resistant     PIP/TAZO <=4 SENSITIVE Sensitive     * >=100,000 COLONIES/mL PROTEUS MIRABILIS  MRSA PCR Screening     Status: None   Collection Time: 01/21/19  8:35 PM  Result Value Ref Range Status   MRSA by PCR NEGATIVE NEGATIVE Final    Comment:        The GeneXpert MRSA Assay (FDA approved for NASAL specimens only), is one component of a comprehensive MRSA colonization surveillance program. It is not intended to diagnose MRSA infection nor to guide or monitor treatment for MRSA infections. Performed at [facility], [place] [address], [place], [place] [address]   Culture, blood (routine x 2)     Status: None   Collection Time: 01/23/19 11:31 AM  Result Value Ref Range Status   Specimen Description BLOOD RIGHT THUMB  Final   Special Requests   Final    BOTTLES DRAWN AEROBIC ONLY Blood Culture adequate volume Performed at [facility], [place] [address] [place], [place] [address]    Culture NO GROWTH 5 DAYS  Final   Report Status 01/28/2019 FINAL  Final  Culture, blood (routine x 2)     Status: None   Collection Time: 01/23/19 11:37 AM  Result Value Ref Range Status   Specimen Description BLOOD LEFT HAND  Final   Special Requests   Final    BOTTLES DRAWN AEROBIC ONLY Blood Culture results may not be optimal due to an inadequate volume of blood received in culture bottles Performed at  [facility], [place] [address], [place], [place] [address]    Culture NO GROWTH 5 DAYS  Final   Report Status 01/28/2019 FINAL  Final    RADIOLOGY STUDIES/RESULTS: Dg Chest Port 1 View  Result Date: 01/21/2019 CLINICAL DATA:  Altered mental status/nonresponsive EXAM: PORTABLE CHEST 1 VIEW COMPARISON:  Chest radiograph December 06, 2018 and chest CT December 07, 2018 FINDINGS: There is no appreciable edema or consolidation. Heart size and pulmonary vascularity are normal. No adenopathy. Patient is undergone previous mid lower thoracic kyphoplasty procedures. There is aortic atherosclerosis. Bones are osteoporotic. IMPRESSION: No edema or consolidation.  Stable cardiac silhouette. Aortic Atherosclerosis (ICD10-I70.0). Electronically Signed   By: Lowella Grip III M.D.   On: 01/21/2019 13:36   [place] Ekg Site Rite  Result Date: 01/26/2019 If Site Rite image not attached, placement could not be confirmed due to current cardiac rhythm.    LOS: 7 days    Signature  Lala Lund M.D on 01/28/2019 at 10:05 AM   -  To  page go to [email or web address]

## 2019-01-28 NOTE — Progress Notes (Signed)
Financial Counseling assisting with Medicaid application for patient to see if SNF will accept her.   Percell Locus [place] LCSW [phone]

## 2019-01-28 NOTE — Progress Notes (Signed)
Central Telemetry notified me that Pt had 16 rounds of SVT. HR fluctuating between 90-105. Pt is asymptomatic and eating in bed. Doctor notified will continue to monitor.

## 2019-01-28 NOTE — Progress Notes (Signed)
Physical Therapy Treatment Patient Details Name: [name] MRN: [id number] DOB: 09/16/1946 Today's Date: 01/28/2019    History of Present Illness  [name] is a 73 year old female with PMH of MI/CAD status post PCI 8676, chronic diastolic CHF, DM 2/IDDM, HTN, HLD, recently hospitalized 12/06/2018-12/11/2018 when she presented with confusion and incontinence after starting meloxicam and baclofen for chest pain, treated in the hospital for acute metabolic encephalopathy due to E. coli UTI and baclofen, suspected mild dementia, musculoskeletal chest pain, discharged to [facility]. Was supposed to go home 2/24 when she was noted to be more confused and very weak. Has been unable to work with therapy at [facility].     PT Comments    Pt presents in bed and alert. Pt very agitated today and c/o financial situation and other personal problems. Pt tolerated therex interventions, but ample time was spent redirecting pt attention to complete tasks. Pt would not agree to transfer or to move/get up from bed. Pt cognition is a major factor in treatment at this time. Pt educated on benefits of bed mobility but pt not agreeable to progress treatment at this time. Pt discharge to SNF at this time is appropriate based on her progress. Plan to progress transfer training and attempt gait as pt is agreeable.   Follow Up Recommendations  SNF;Supervision/Assistance - 24 hour     Equipment Recommendations  None recommended by PT    Recommendations for Other Services       Precautions / Restrictions Precautions Precautions: Fall Restrictions Weight Bearing Restrictions: No    Mobility  Bed Mobility Overal bed mobility: Needs Assistance             General bed mobility comments: Pt declined bed mobility.   Transfers Overall transfer level: Needs assistance               General transfer comment: Pt refused transfers as she stated she had three baths already today and wasn't going to go  to the chair.  Ambulation/Gait             General Gait Details: unable   Stairs             Wheelchair Mobility    Modified Rankin (Stroke Patients Only)       Balance       Sitting balance - Comments: unable to assess during treatment       Standing balance comment: unable to assess during treatment                            Cognition Arousal/Alertness: Awake/alert Behavior During Therapy: Anxious;Agitated;Impulsive Overall Cognitive Status: Impaired/Different from baseline Area of Impairment: Orientation;Attention;Memory;Following commands;Safety/judgement;Awareness;Problem solving                 Orientation Level: Place;Time;Situation Current Attention Level: Selective Memory: Decreased short-term memory   Safety/Judgement: Decreased awareness of safety;Decreased awareness of deficits   Problem Solving: Decreased initiation;Requires verbal cues;Requires tactile cues;Slow processing;Difficulty sequencing General Comments: Pt able to communicate more effectively than last treatment. Pt confused and upset requiring VC and coaching to better align pt for therapeutic activity.        Exercises General Exercises - Lower Extremity Ankle Circles/Pumps: AROM;15 reps;Both Hip ABduction/ADduction: AAROM;10 reps;Both    General Comments        Pertinent Vitals/Pain Pain Assessment: No/denies pain    Home Living  Prior Function            PT Goals (current goals can now be found in the care plan section) Acute Rehab PT Goals Patient Stated Goal: to get her Lucianne Lei and get home PT Goal Formulation: Patient unable to participate in goal setting Potential to Achieve Goals: Poor    Frequency    Min 2X/week      PT Plan Current plan remains appropriate    Co-evaluation              AM-PAC PT "6 Clicks" Mobility   Outcome Measure  Help needed turning from your back to your side while in a  flat bed without using bedrails?: Total Help needed moving from lying on your back to sitting on the side of a flat bed without using bedrails?: Total Help needed moving to and from a bed to a chair (including a wheelchair)?: Total Help needed standing up from a chair using your arms (e.g., wheelchair or bedside chair)?: Total Help needed to walk in hospital room?: Total Help needed climbing 3-5 steps with a railing? : Total 6 Click Score: 6    End of Session   Activity Tolerance: Patient limited by lethargy;Patient limited by pain;Treatment limited secondary to medical complications (Comment) Patient left: in bed;with bed alarm set;with call bell/phone within reach Nurse Communication: Mobility status PT Visit Diagnosis: Pain;Muscle weakness (generalized) (M62.81) Pain - part of body: Leg;Arm     Time: 8185-6314 PT Time Calculation (min) (ACUTE ONLY): 25 min  Charges:  $Therapeutic Exercise: 8-22 mins                     Maryelizabeth Kaufmann, SPTA   Maryelizabeth Kaufmann 01/28/2019, 5:14 PM

## 2019-01-29 LAB — GLUCOSE, CAPILLARY
Glucose-Capillary: 87 mg/dL (ref 70–99)
Glucose-Capillary: 91 mg/dL (ref 70–99)
Glucose-Capillary: 108 mg/dL — ABNORMAL HIGH (ref 70–99)
Glucose-Capillary: 136 mg/dL — ABNORMAL HIGH (ref 70–99)

## 2019-01-29 LAB — MAGNESIUM: MAGNESIUM: 2 mg/dL (ref 1.7–2.4)

## 2019-01-29 LAB — VANCOMYCIN, PEAK: VANCOMYCIN PK: 32 ug/mL (ref 30–40)

## 2019-01-29 NOTE — Progress Notes (Signed)
PROGRESS NOTE        PATIENT DETAILS Name: [name] Age: 73 y.o. Sex: female Date of Birth: September 14, 1946 Admit Date: 01/21/2019 Admitting Physician Modena Jansky, MD [id number], [name], MD  Brief Narrative: Patient is a 74 y.o. female with prior history of CAD status post PCI in 7124, chronic diastolic heart failure, DM-2, hypertension, dyslipidemia presenting with acute metabolic encephalopathy secondary to acute kidney injury and sepsis with hypotension from gram-positive bacteremia.  See below for further details  Subjective:  Patient in bed, appears comfortable, denies any headache, no fever, no chest pain or pressure, no shortness of breath , no abdominal pain. No focal weakness.   Assessment/Plan:  Sepsis with hypotension secondary to staph epidermidis bacteremia and ESBL Proteus UTI: She had 3 out of 4 bottles positive for staph epidermidis, urinary retention with ESBL Proteus UTI.  Seen by ID.  Currently on vancomycin and meropenem.  Sepsis pathophysiology has resolved, repeat blood cultures drawn on 01/23/2019 are so far negative.  Echo done 12/08/2018 unremarkable.  Per ID total 10 days of IV vancomycin with stop date being 02/02/2019 2010 days from the last negative cultures, meropenem stop date 01/28/2019.  Acute renal failure: Due to sepsis along with home use of ACE inhibitor, NSAIDs, resolved after hydration, continue to hold offending medications.    Anion gap metabolic acidosis: To #1 and #2 above.  Resolved.  Acute metabolic encephalopathy: Due to sepsis and renal failure, much improved, nonfocal exam.  Continue supportive care.  Minimize narcotic and benzodiazepine use.  CAD s/p PCI 2014: Supportive care-no obvious anginal symptoms present.  Hypertension: Blood pressure controlled low-dose metoprolol  Runs of narrow complex tachycardia/SVT: Stable with low-dose metoprolol.   Chronic diastolic heart failure: Volume status stable-monitor  closely while on IV fluids.  Continue to hold diuretics for now.  Large multinodular goiter: Stable for outpatient follow-up-this is a chronic issue.  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  Dysphagia: SLP following advanced to dysphagia 3 diet.  Will monitor.  Mild antibiotic induced diarrhea.  No fever or leukocytosis.  Imodium.  Bladder outlet obstruction.  Continue Flomax, Trial of Foley removal on 01/28/2019 will monitor.  Insulin-dependent DM-2: CBG stable-continue SSI.  Continue to hold oral hypoglycemic agents and Actos for now.  Lab Results  Component Value Date   HGBA1C 6.0 12/13/2018   CBG (last 3)  Recent Labs    01/28/19 1718 01/28/19 2210 01/29/19 0848  GLUCAP 132* 113* 87      DVT Prophylaxis: Prophylactic Heparin   Code Status: DNR  Family Communication: None at bedside  Disposition Plan: Remain inpatient SNF on discharge-likely next week  Antimicrobial agents: Anti-infectives (From admission, onward)   Start     Dose/Rate Route Frequency Ordered Stop   01/25/19 1500  vancomycin (VANCOCIN) IVPB 1000 mg/200 mL premix     1,000 mg 200 mL/hr over 60 Minutes Intravenous Every 24 hours 01/25/19 0947 02/02/19 2359   01/25/19 1400  meropenem (MERREM) 1 g in sodium chloride 0.9 % 100 mL IVPB     1 g 200 mL/hr over 30 Minutes Intravenous Every 8 hours 01/25/19 0947 01/28/19 1444   01/24/19 1500  vancomycin (VANCOCIN) 1,500 mg in sodium chloride 0.9 % 500 mL IVPB  Status:  Discontinued     1,500 mg 250 mL/hr over 120 Minutes Intravenous Every  24 hours 01/24/19 1403 01/25/19 0947   01/23/19 2330  meropenem (MERREM) 1 g in sodium chloride 0.9 % 100 mL IVPB  Status:  Discontinued     1 g 200 mL/hr over 30 Minutes Intravenous Every 12 hours 01/23/19 2321 01/25/19 0947   01/22/19 1200  ceFEPIme (MAXIPIME) 1 g in sodium chloride 0.9 % 100 mL IVPB  Status:  Discontinued     1 g 200 mL/hr over 30 Minutes  Intravenous Every 24 hours 01/21/19 1446 01/22/19 0956   01/21/19 1446  vancomycin variable dose per unstable renal function (pharmacist dosing)  Status:  Discontinued      Does not apply See admin instructions 01/21/19 1446 01/22/19 0941   01/21/19 1415  ceFEPIme (MAXIPIME) 2 g in sodium chloride 0.9 % 100 mL IVPB     2 g 200 mL/hr over 30 Minutes Intravenous  Once 01/21/19 1403 01/21/19 1449   01/21/19 1415  vancomycin (VANCOCIN) IVPB 1000 mg/200 mL premix  Status:  Discontinued     1,000 mg 200 mL/hr over 60 Minutes Intravenous  Once 01/21/19 1403 01/21/19 1411   01/21/19 1415  vancomycin (VANCOCIN) 2,500 mg in sodium chloride 0.9 % 500 mL IVPB     2,500 mg 250 mL/hr over 120 Minutes Intravenous  Once 01/21/19 1411 01/21/19 1937      Procedures: None  CONSULTS:  None  Time spent: 25 minutes-Greater than 50% of this time was spent in counseling, explanation of diagnosis, planning of further management, and coordination of care.  MEDICATIONS: Scheduled Meds: . feeding supplement (GLUCERNA SHAKE)  237 mL Oral TID BM  . heparin  5,000 Units Subcutaneous Q8H  . insulin aspart  0-5 Units Subcutaneous QHS  . insulin aspart  0-9 Units Subcutaneous TID WC  . metoprolol tartrate  50 mg Oral BID  . potassium chloride  40 mEq Oral Daily  . sodium chloride flush  3 mL Intravenous Q12H  . tamsulosin  0.4 mg Oral Daily   Continuous Infusions: . vancomycin 1,000 mg (01/28/19 1414)   PRN Meds:.acetaminophen **OR** [DISCONTINUED] acetaminophen, albuterol, loperamide, metoprolol tartrate, ondansetron (ZOFRAN) IV, senna-docusate   PHYSICAL EXAM: Vital signs: Vitals:   01/29/19 0000 01/29/19 0232 01/29/19 0338 01/29/19 0517  BP:    106/65  Pulse: 94  (!) 57 79  Resp: 13  19 17   Temp:    98.5 F (36.9 C)  TempSrc:    Oral  SpO2: 93%  100% 94%  Weight:  104.4 kg 104.4 kg   Height:       Filed Weights   01/28/19 0545 01/29/19 0232 01/29/19 0338  Weight: 103.8 kg 104.4 kg 104.4 kg     Body mass index is 34.49 kg/m.   Exam  Awake Alert, Oriented X 3, No new F.N deficits, Normal affect Delta.AT,PERRAL Supple Neck,No JVD, No cervical lymphadenopathy appriciated.  Symmetrical Chest wall movement, Good air movement bilaterally, CTAB RRR,No Gallops, Rubs or new Murmurs, No Parasternal Heave +ve B.Sounds, Abd Soft, No tenderness, No organomegaly appriciated, No rebound - guarding or rigidity. No Cyanosis, Clubbing or edema, says she has chronically swollen enlarged feet and legs, bilat heal bandages   I have personally reviewed following labs and imaging studies  LABORATORY DATA: CBC: Recent Labs  Lab 01/23/19 0351 01/27/19 0334 01/28/19 0405  WBC 7.0 7.4 7.6  HGB 13.1 10.7* 11.4*  HCT 37.3 33.6* 36.8  MCV 87.6 96.0 95.3  PLT 104* 124* 127*    Basic Metabolic Panel: Recent Labs  Lab 01/24/19 0444  01/25/19 0439 01/26/19 1109 01/27/19 0334 01/28/19 0405 01/29/19 0424  NA 149* 149* 146* 141 141  --   K 2.3* 2.8* 3.4* 3.8 4.4  --   CL 104 107 106 105 106  --   CO2 30 31 32 31 30  --   GLUCOSE 121* 156* 122* 93 82  --   BUN 70* 48* 31* 27* 24*  --   CREATININE 1.24* 1.03* 0.75 0.75 0.86  --   CALCIUM 7.7* 7.7* 8.4* 8.3* 8.4*  --   MG  --  1.4* 2.1 1.8 1.6* 2.0    GFR: Estimated Creatinine Clearance: 73.2 mL/min (by C-G formula based on SCr of 0.86 mg/dL).  Liver Function Tests: Recent Labs  Lab 01/23/19 0351  AST 18  ALT QUANTITY NOT SUFFICIENT, UNABLE TO PERFORM TEST  ALKPHOS 45  BILITOT QUANTITY NOT SUFFICIENT, UNABLE TO PERFORM TEST  PROT 5.5*  ALBUMIN 1.8*   No results for input(s): LIPASE, AMYLASE in the last 168 hours. No results for input(s): AMMONIA in the last 168 hours.  Coagulation Profile: No results for input(s): INR, PROTIME in the last 168 hours.  Cardiac Enzymes: No results for input(s): CKTOTAL, CKMB, CKMBINDEX, TROPONINI in the last 168 hours.  BNP (last 3 results) No results for input(s): PROBNP in the last 8760  hours.  HbA1C: No results for input(s): HGBA1C in the last 72 hours.  CBG: Recent Labs  Lab 01/28/19 0841 01/28/19 1219 01/28/19 1718 01/28/19 2210 01/29/19 0848  GLUCAP 67* 96 132* 113* 87    Lipid Profile: No results for input(s): CHOL, HDL, LDLCALC, TRIG, CHOLHDL, LDLDIRECT in the last 72 hours.  Thyroid Function Tests: No results for input(s): TSH, T4TOTAL, FREET4, T3FREE, THYROIDAB in the last 72 hours.  Anemia Panel: No results for input(s): VITAMINB12, FOLATE, FERRITIN, TIBC, IRON, RETICCTPCT in the last 72 hours.  Urine analysis:    Component Value Date/Time   COLORURINE YELLOW 01/21/2019 1620   APPEARANCEUR TURBID (A) 01/21/2019 1620   LABSPEC 1.020 01/21/2019 1620   PHURINE 8.0 01/21/2019 1620   GLUCOSEU NEGATIVE 01/21/2019 1620   HGBUR MODERATE (A) 01/21/2019 1620   BILIRUBINUR NEGATIVE 01/21/2019 1620   KETONESUR NEGATIVE 01/21/2019 1620   PROTEINUR 100 (A) 01/21/2019 1620   UROBILINOGEN 1.0 06/28/2015 1552   NITRITE NEGATIVE 01/21/2019 1620   LEUKOCYTESUR LARGE (A) 01/21/2019 1620    Sepsis Labs: Lactic Acid, Venous    Component Value Date/Time   LATICACIDVEN 2.5 (HH) 01/21/2019 1718    MICROBIOLOGY: Recent Results (from the past 240 hour(s))  Blood Culture (routine x 2)     Status: Abnormal   Collection Time: 01/21/19  1:35 PM  Result Value Ref Range Status   Specimen Description BLOOD RIGHT ANTECUBITAL  Final   Special Requests   Final    BOTTLES DRAWN AEROBIC AND ANAEROBIC Blood Culture adequate volume   Culture  Setup Time   Final    GRAM POSITIVE COCCI IN BOTH AEROBIC AND ANAEROBIC BOTTLES CRITICAL RESULT CALLED TO, READ BACK BY AND VERIFIED WITH: Karlene Einstein PharmD 11:00 01/22/19 ([email or web address]) Performed at [facility], [place] [address], [place], [place] [address]    Culture STAPHYLOCOCCUS EPIDERMIDIS (A)  Final   Report Status 01/24/2019 FINAL  Final   Organism ID, Bacteria STAPHYLOCOCCUS EPIDERMIDIS  Final      Susceptibility    Staphylococcus epidermidis - MIC*    CIPROFLOXACIN >=8 RESISTANT Resistant     ERYTHROMYCIN >=8 RESISTANT Resistant     GENTAMICIN <=0.5 SENSITIVE  Sensitive     OXACILLIN >=4 RESISTANT Resistant     TETRACYCLINE 2 SENSITIVE Sensitive     VANCOMYCIN 1 SENSITIVE Sensitive     TRIMETH/SULFA 80 RESISTANT Resistant     CLINDAMYCIN >=8 RESISTANT Resistant     RIFAMPIN <=0.5 SENSITIVE Sensitive     Inducible Clindamycin NEGATIVE Sensitive     * STAPHYLOCOCCUS EPIDERMIDIS  Blood Culture (routine x 2)     Status: Abnormal   Collection Time: 01/21/19  1:35 PM  Result Value Ref Range Status   Specimen Description BLOOD RIGHT ANTECUBITAL  Final   Special Requests   Final    BOTTLES DRAWN AEROBIC AND ANAEROBIC Blood Culture adequate volume   Culture  Setup Time   Final    GRAM POSITIVE COCCI ANAEROBIC BOTTLE ONLY CRITICAL VALUE NOTED.  VALUE IS CONSISTENT WITH PREVIOUSLY REPORTED AND CALLED VALUE.    Culture (A)  Final    STAPHYLOCOCCUS EPIDERMIDIS SUSCEPTIBILITIES PERFORMED ON PREVIOUS CULTURE WITHIN THE LAST 5 DAYS. Performed at [facility], [place] [address] [place], [place] [address]    Report Status 01/24/2019 FINAL  Final  Blood Culture ID Panel (Reflexed)     Status: Abnormal   Collection Time: 01/21/19  1:35 PM  Result Value Ref Range Status   Enterococcus species NOT DETECTED NOT DETECTED Final   Listeria monocytogenes NOT DETECTED NOT DETECTED Final   Staphylococcus species DETECTED (A) NOT DETECTED Final    Comment: Methicillin (oxacillin) resistant coagulase negative staphylococcus. Possible blood culture contaminant (unless isolated from more than one blood culture draw or clinical case suggests pathogenicity). No antibiotic treatment is indicated for blood  culture contaminants. CRITICAL RESULT CALLED TO, READ BACK BY AND VERIFIED WITH: Karlene Einstein PharmD 11:00 01/22/19 ([email or web address])    Staphylococcus aureus (BCID) NOT DETECTED NOT DETECTED Final   Methicillin resistance  DETECTED (A) NOT DETECTED Final    Comment: CRITICAL RESULT CALLED TO, READ BACK BY AND VERIFIED WITH: Karlene Einstein PharmD 11:00 01/22/19 ([email or web address])    Streptococcus species NOT DETECTED NOT DETECTED Final   Streptococcus agalactiae NOT DETECTED NOT DETECTED Final   Streptococcus pneumoniae NOT DETECTED NOT DETECTED Final   Streptococcus pyogenes NOT DETECTED NOT DETECTED Final   Acinetobacter baumannii NOT DETECTED NOT DETECTED Final   Enterobacteriaceae species NOT DETECTED NOT DETECTED Final   Enterobacter cloacae complex NOT DETECTED NOT DETECTED Final   Escherichia coli NOT DETECTED NOT DETECTED Final   Klebsiella oxytoca NOT DETECTED NOT DETECTED Final   Klebsiella pneumoniae NOT DETECTED NOT DETECTED Final   Proteus species NOT DETECTED NOT DETECTED Final   Serratia marcescens NOT DETECTED NOT DETECTED Final   Haemophilus influenzae NOT DETECTED NOT DETECTED Final   Neisseria meningitidis NOT DETECTED NOT DETECTED Final   Pseudomonas aeruginosa NOT DETECTED NOT DETECTED Final   Candida albicans NOT DETECTED NOT DETECTED Final   Candida glabrata NOT DETECTED NOT DETECTED Final   Candida krusei NOT DETECTED NOT DETECTED Final   Candida parapsilosis NOT DETECTED NOT DETECTED Final   Candida tropicalis NOT DETECTED NOT DETECTED Final    Comment: Performed at [facility], [place]. [address], [place], [place] [address]  Urine culture     Status: Abnormal   Collection Time: 01/21/19  4:20 PM  Result Value Ref Range Status   Specimen Description URINE, RANDOM  Final   Special Requests NONE  Final   Culture (A)  Final    >=100,000 COLONIES/mL PROTEUS MIRABILIS Susceptibility Pattern Suggests Possibility of an Extended Spectrum Beta  Lactamase Producer. Contact Laboratory Within 7 Days if Confirmation Warranted. Performed at [facility], [place] [address], [place], [place] [address]    Report Status 01/23/2019 FINAL  Final   Organism ID, Bacteria PROTEUS MIRABILIS (A)  Final       Susceptibility   Proteus mirabilis - MIC*    AMPICILLIN >=32 RESISTANT Resistant     CEFAZOLIN >=64 RESISTANT Resistant     CEFTRIAXONE RESISTANT Resistant     CIPROFLOXACIN >=4 RESISTANT Resistant     GENTAMICIN <=1 SENSITIVE Sensitive     IMIPENEM 2 SENSITIVE Sensitive     NITROFURANTOIN 128 RESISTANT Resistant     TRIMETH/SULFA >=320 RESISTANT Resistant     AMPICILLIN/SULBACTAM >=32 RESISTANT Resistant     PIP/TAZO <=4 SENSITIVE Sensitive     * >=100,000 COLONIES/mL PROTEUS MIRABILIS  MRSA PCR Screening     Status: None   Collection Time: 01/21/19  8:35 PM  Result Value Ref Range Status   MRSA by PCR NEGATIVE NEGATIVE Final    Comment:        The GeneXpert MRSA Assay (FDA approved for NASAL specimens only), is one component of a comprehensive MRSA colonization surveillance program. It is not intended to diagnose MRSA infection nor to guide or monitor treatment for MRSA infections. Performed at [facility], [place] [address] [place], [place] [address]   Culture, blood (routine x 2)     Status: None   Collection Time: 01/23/19 11:31 AM  Result Value Ref Range Status   Specimen Description BLOOD RIGHT THUMB  Final   Special Requests   Final    BOTTLES DRAWN AEROBIC ONLY Blood Culture adequate volume Performed at [facility], [place] [address], [place], [place] [address]    Culture NO GROWTH 5 DAYS  Final   Report Status 01/28/2019 FINAL  Final  Culture, blood (routine x 2)     Status: None   Collection Time: 01/23/19 11:37 AM  Result Value Ref Range Status   Specimen Description BLOOD LEFT HAND  Final   Special Requests   Final    BOTTLES DRAWN AEROBIC ONLY Blood Culture results may not be optimal due to an inadequate volume of blood received in culture bottles Performed at [facility], [place] [address] [place], [place] [address]    Culture NO GROWTH 5 DAYS  Final   Report Status 01/28/2019 FINAL  Final    RADIOLOGY STUDIES/RESULTS: Dg Chest Port 1  View  Result Date: 01/21/2019 CLINICAL DATA:  Altered mental status/nonresponsive EXAM: PORTABLE CHEST 1 VIEW COMPARISON:  Chest radiograph December 06, 2018 and chest CT December 07, 2018 FINDINGS: There is no appreciable edema or consolidation. Heart size and pulmonary vascularity are normal. No adenopathy. Patient is undergone previous mid lower thoracic kyphoplasty procedures. There is aortic atherosclerosis. Bones are osteoporotic. IMPRESSION: No edema or consolidation.  Stable cardiac silhouette. Aortic Atherosclerosis (ICD10-I70.0). Electronically Signed   By: Lowella Grip III M.D.   On: 01/21/2019 13:36   [place] Ekg Site Rite  Result Date: 01/26/2019 If Site Rite image not attached, placement could not be confirmed due to current cardiac rhythm.    LOS: 8 days    Signature  Lala Lund M.D on 01/29/2019 at 8:58 AM   -  To page go to [email or web address]

## 2019-01-30 LAB — GLUCOSE, CAPILLARY
Glucose-Capillary: 54 mg/dL — ABNORMAL LOW (ref 70–99)
Glucose-Capillary: 57 mg/dL — ABNORMAL LOW (ref 70–99)
Glucose-Capillary: 68 mg/dL — ABNORMAL LOW (ref 70–99)
Glucose-Capillary: 95 mg/dL (ref 70–99)
Glucose-Capillary: 115 mg/dL — ABNORMAL HIGH (ref 70–99)
Glucose-Capillary: 86 mg/dL (ref 70–99)

## 2019-01-30 LAB — HEMOGLOBIN A1C
Hgb A1c MFr Bld: 7.2 % — ABNORMAL HIGH (ref 4.8–5.6)
Mean Plasma Glucose: 159.94 mg/dL

## 2019-01-30 LAB — VANCOMYCIN, TROUGH: Vancomycin Tr: 24 ug/mL (ref 15–20)

## 2019-01-30 MED ORDER — VANCOMYCIN HCL 10 G IV SOLR
1500.0000 mg | INTRAVENOUS | Status: DC
  Filled 2019-01-30: qty 1500

## 2019-01-30 NOTE — Progress Notes (Signed)
PROGRESS NOTE        PATIENT DETAILS Name: [name] Age: 73 y.o. Sex: female Date of Birth: 22-Oct-1946 Admit Date: 01/21/2019 Admitting Physician Modena Jansky, MD PPI:[id number], [name], MD  Brief Narrative: Patient is a 73 y.o. female with prior history of CAD status post PCI in 1660, chronic diastolic heart failure, DM-2, hypertension, dyslipidemia presenting with acute metabolic encephalopathy secondary to acute kidney injury and sepsis with hypotension from gram-positive bacteremia.  See below for further details  Subjective:  Patient in bed, appears comfortable, denies any headache, no fever, no chest pain or pressure, no shortness of breath , no abdominal pain. No focal weakness.  Assessment/Plan:  Sepsis with hypotension secondary to staph epidermidis bacteremia and ESBL Proteus UTI: She had 3 out of 4 bottles positive for staph epidermidis, urinary retention with ESBL Proteus UTI.  Seen by ID.  Currently on vancomycin and meropenem.  Sepsis pathophysiology has resolved, repeat blood cultures drawn on 01/23/2019 are so far negative.  Echo done 12/08/2018 unremarkable.  Per ID total 10 days of IV vancomycin with stop date being 02/02/2019 2010 days from the last negative cultures, meropenem stop date 01/28/2019.  Acute renal failure: Due to sepsis along with home use of ACE inhibitor, NSAIDs, resolved after hydration, continue to hold offending medications.    Anion gap metabolic acidosis: To #1 and #2 above.  Resolved.  Acute metabolic encephalopathy: Due to sepsis and renal failure, much improved, nonfocal exam.  Continue supportive care.  Minimize narcotic and benzodiazepine use.  CAD s/p PCI 2014: Supportive care-no obvious anginal symptoms present.  Hypertension: Blood pressure controlled low-dose metoprolol  Runs of narrow complex tachycardia/SVT: Stable with low-dose metoprolol.   Chronic diastolic heart failure: Volume status stable-monitor  closely while on IV fluids.  Continue to hold diuretics for now.  Large multinodular goiter: Stable for outpatient follow-up-this is a chronic issue.  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  Dysphagia: SLP following advanced to dysphagia 3 diet.  Will monitor.  Mild antibiotic induced diarrhea.  No fever or leukocytosis.  Imodium.  Bladder outlet obstruction.  Continue Flomax, Trial of Foley removal on 01/28/2019 will monitor.  Insulin-dependent DM-2: Home dose Actos has been held, with just low-dose sliding scale 3 times a day with meals she is still becoming hypoglycemic early in the morning, will discontinue sliding scale and monitor on low carb diet only.  Continue checking CBGs for now.  Lab Results  Component Value Date   HGBA1C 6.0 12/13/2018   CBG (last 3)  Recent Labs    01/29/19 1716 01/29/19 2135 01/30/19 0831  GLUCAP 108* 136* 54*      DVT Prophylaxis: Prophylactic Heparin   Code Status: DNR  Family Communication: None at bedside  Disposition Plan: Remain inpatient SNF on discharge-likely next week  Antimicrobial agents: Anti-infectives (From admission, onward)   Start     Dose/Rate Route Frequency Ordered Stop   01/25/19 1500  vancomycin (VANCOCIN) IVPB 1000 mg/200 mL premix     1,000 mg 200 mL/hr over 60 Minutes Intravenous Every 24 hours 01/25/19 0947 02/02/19 2359   01/25/19 1400  meropenem (MERREM) 1 g in sodium chloride 0.9 % 100 mL IVPB     1 g 200 mL/hr over 30 Minutes Intravenous Every 8 hours 01/25/19 0947 01/28/19 1444   01/24/19 1500  vancomycin (  VANCOCIN) 1,500 mg in sodium chloride 0.9 % 500 mL IVPB  Status:  Discontinued     1,500 mg 250 mL/hr over 120 Minutes Intravenous Every 24 hours 01/24/19 1403 01/25/19 0947   01/23/19 2330  meropenem (MERREM) 1 g in sodium chloride 0.9 % 100 mL IVPB  Status:  Discontinued     1 g 200 mL/hr over 30 Minutes Intravenous Every 12 hours 01/23/19  2321 01/25/19 0947   01/22/19 1200  ceFEPIme (MAXIPIME) 1 g in sodium chloride 0.9 % 100 mL IVPB  Status:  Discontinued     1 g 200 mL/hr over 30 Minutes Intravenous Every 24 hours 01/21/19 1446 01/22/19 0956   01/21/19 1446  vancomycin variable dose per unstable renal function (pharmacist dosing)  Status:  Discontinued      Does not apply See admin instructions 01/21/19 1446 01/22/19 0941   01/21/19 1415  ceFEPIme (MAXIPIME) 2 g in sodium chloride 0.9 % 100 mL IVPB     2 g 200 mL/hr over 30 Minutes Intravenous  Once 01/21/19 1403 01/21/19 1449   01/21/19 1415  vancomycin (VANCOCIN) IVPB 1000 mg/200 mL premix  Status:  Discontinued     1,000 mg 200 mL/hr over 60 Minutes Intravenous  Once 01/21/19 1403 01/21/19 1411   01/21/19 1415  vancomycin (VANCOCIN) 2,500 mg in sodium chloride 0.9 % 500 mL IVPB     2,500 mg 250 mL/hr over 120 Minutes Intravenous  Once 01/21/19 1411 01/21/19 1937      Procedures: None  CONSULTS:  None  Time spent: 25 minutes-Greater than 50% of this time was spent in counseling, explanation of diagnosis, planning of further management, and coordination of care.  MEDICATIONS: Scheduled Meds: . feeding supplement (GLUCERNA SHAKE)  237 mL Oral TID BM  . heparin  5,000 Units Subcutaneous Q8H  . insulin aspart  0-5 Units Subcutaneous QHS  . insulin aspart  0-9 Units Subcutaneous TID WC  . metoprolol tartrate  50 mg Oral BID  . potassium chloride  40 mEq Oral Daily  . sodium chloride flush  3 mL Intravenous Q12H  . tamsulosin  0.4 mg Oral Daily   Continuous Infusions: . vancomycin 1,000 mg (01/29/19 1426)   PRN Meds:.acetaminophen **OR** [DISCONTINUED] acetaminophen, albuterol, loperamide, metoprolol tartrate, ondansetron (ZOFRAN) IV, senna-docusate   PHYSICAL EXAM: Vital signs: Vitals:   01/29/19 2139 01/30/19 0138 01/30/19 0610 01/30/19 0754  BP: 98/61  108/85 97/84  Pulse: (!) 111 85 96 84  Resp: 19 18 16  (!) 23  Temp: 98.8 F (37.1 C)  97.6 F  (36.4 C) 97.7 F (36.5 C)  TempSrc: Axillary  Oral Oral  SpO2: 97% 96% 95% 94%  Weight:  107 kg    Height:       Filed Weights   01/29/19 0232 01/29/19 0338 01/30/19 0138  Weight: 104.4 kg 104.4 kg 107 kg   Body mass index is 35.35 kg/m.   Exam  Awake Alert, Oriented X 3, No new F.N deficits, Normal affect [place].AT,PERRAL Supple Neck,No JVD, No cervical lymphadenopathy appriciated.  Symmetrical Chest wall movement, Good air movement bilaterally, CTAB RRR,No Gallops, Rubs or new Murmurs, No Parasternal Heave +ve B.Sounds, Abd Soft, No tenderness, No organomegaly appriciated, No rebound - guarding or rigidity. No Cyanosis, Clubbing or edema, says she has chronically swollen enlarged feet and legs, bilat heal bandages   I have personally reviewed following labs and imaging studies  LABORATORY DATA: CBC: Recent Labs  Lab 01/27/19 0334 01/28/19 0405  WBC 7.4 7.6  HGB  10.7* 11.4*  HCT 33.6* 36.8  MCV 96.0 95.3  PLT 124* 127*    Basic Metabolic Panel: Recent Labs  Lab 01/24/19 0444 01/25/19 0439 01/26/19 1109 01/27/19 0334 01/28/19 0405 01/29/19 0424  NA 149* 149* 146* 141 141  --   K 2.3* 2.8* 3.4* 3.8 4.4  --   CL 104 107 106 105 106  --   CO2 30 31 32 31 30  --   GLUCOSE 121* 156* 122* 93 82  --   BUN 70* 48* 31* 27* 24*  --   CREATININE 1.24* 1.03* 0.75 0.75 0.86  --   CALCIUM 7.7* 7.7* 8.4* 8.3* 8.4*  --   MG  --  1.4* 2.1 1.8 1.6* 2.0    GFR: Estimated Creatinine Clearance: 74.2 mL/min (by C-G formula based on SCr of 0.86 mg/dL).  Liver Function Tests: No results for input(s): AST, ALT, ALKPHOS, BILITOT, PROT, ALBUMIN in the last 168 hours. No results for input(s): LIPASE, AMYLASE in the last 168 hours. No results for input(s): AMMONIA in the last 168 hours.  Coagulation Profile: No results for input(s): INR, PROTIME in the last 168 hours.  Cardiac Enzymes: No results for input(s): CKTOTAL, CKMB, CKMBINDEX, TROPONINI in the last 168 hours.  BNP  (last 3 results) No results for input(s): PROBNP in the last 8760 hours.  HbA1C: No results for input(s): HGBA1C in the last 72 hours.  CBG: Recent Labs  Lab 01/29/19 0848 01/29/19 1246 01/29/19 1716 01/29/19 2135 01/30/19 0831  GLUCAP 87 91 108* 136* 54*    Lipid Profile: No results for input(s): CHOL, HDL, LDLCALC, TRIG, CHOLHDL, LDLDIRECT in the last 72 hours.  Thyroid Function Tests: No results for input(s): TSH, T4TOTAL, FREET4, T3FREE, THYROIDAB in the last 72 hours.  Anemia Panel: No results for input(s): VITAMINB12, FOLATE, FERRITIN, TIBC, IRON, RETICCTPCT in the last 72 hours.  Urine analysis:    Component Value Date/Time   COLORURINE YELLOW 01/21/2019 1620   APPEARANCEUR TURBID (A) 01/21/2019 1620   LABSPEC 1.020 01/21/2019 1620   PHURINE 8.0 01/21/2019 1620   GLUCOSEU NEGATIVE 01/21/2019 1620   HGBUR MODERATE (A) 01/21/2019 1620   BILIRUBINUR NEGATIVE 01/21/2019 1620   KETONESUR NEGATIVE 01/21/2019 1620   PROTEINUR 100 (A) 01/21/2019 1620   UROBILINOGEN 1.0 06/28/2015 1552   NITRITE NEGATIVE 01/21/2019 1620   LEUKOCYTESUR LARGE (A) 01/21/2019 1620    Sepsis Labs: Lactic Acid, Venous    Component Value Date/Time   LATICACIDVEN 2.5 (HH) 01/21/2019 1718    MICROBIOLOGY: Recent Results (from the past 240 hour(s))  Blood Culture (routine x 2)     Status: Abnormal   Collection Time: 01/21/19  1:35 PM  Result Value Ref Range Status   Specimen Description BLOOD RIGHT ANTECUBITAL  Final   Special Requests   Final    BOTTLES DRAWN AEROBIC AND ANAEROBIC Blood Culture adequate volume   Culture  Setup Time   Final    GRAM POSITIVE COCCI IN BOTH AEROBIC AND ANAEROBIC BOTTLES CRITICAL RESULT CALLED TO, READ BACK BY AND VERIFIED WITH: Karlene Einstein PharmD 11:00 01/22/19 ([email or web address]) Performed at [facility], [place] [address], [place], [place] [address]    Culture STAPHYLOCOCCUS EPIDERMIDIS (A)  Final   Report Status 01/24/2019 FINAL  Final   Organism ID,  Bacteria STAPHYLOCOCCUS EPIDERMIDIS  Final      Susceptibility   Staphylococcus epidermidis - MIC*    CIPROFLOXACIN >=8 RESISTANT Resistant     ERYTHROMYCIN >=8 RESISTANT Resistant  GENTAMICIN <=0.5 SENSITIVE Sensitive     OXACILLIN >=4 RESISTANT Resistant     TETRACYCLINE 2 SENSITIVE Sensitive     VANCOMYCIN 1 SENSITIVE Sensitive     TRIMETH/SULFA 80 RESISTANT Resistant     CLINDAMYCIN >=8 RESISTANT Resistant     RIFAMPIN <=0.5 SENSITIVE Sensitive     Inducible Clindamycin NEGATIVE Sensitive     * STAPHYLOCOCCUS EPIDERMIDIS  Blood Culture (routine x 2)     Status: Abnormal   Collection Time: 01/21/19  1:35 PM  Result Value Ref Range Status   Specimen Description BLOOD RIGHT ANTECUBITAL  Final   Special Requests   Final    BOTTLES DRAWN AEROBIC AND ANAEROBIC Blood Culture adequate volume   Culture  Setup Time   Final    GRAM POSITIVE COCCI ANAEROBIC BOTTLE ONLY CRITICAL VALUE NOTED.  VALUE IS CONSISTENT WITH PREVIOUSLY REPORTED AND CALLED VALUE.    Culture (A)  Final    STAPHYLOCOCCUS EPIDERMIDIS SUSCEPTIBILITIES PERFORMED ON PREVIOUS CULTURE WITHIN THE LAST 5 DAYS. Performed at [facility], [place] [address], [place], [place] [address]    Report Status 01/24/2019 FINAL  Final  Blood Culture ID Panel (Reflexed)     Status: Abnormal   Collection Time: 01/21/19  1:35 PM  Result Value Ref Range Status   Enterococcus species NOT DETECTED NOT DETECTED Final   Listeria monocytogenes NOT DETECTED NOT DETECTED Final   Staphylococcus species DETECTED (A) NOT DETECTED Final    Comment: Methicillin (oxacillin) resistant coagulase negative staphylococcus. Possible blood culture contaminant (unless isolated from more than one blood culture draw or clinical case suggests pathogenicity). No antibiotic treatment is indicated for blood  culture contaminants. CRITICAL RESULT CALLED TO, READ BACK BY AND VERIFIED WITH: Karlene Einstein PharmD 11:00 01/22/19 ([email or web address])    Staphylococcus aureus  (BCID) NOT DETECTED NOT DETECTED Final   Methicillin resistance DETECTED (A) NOT DETECTED Final    Comment: CRITICAL RESULT CALLED TO, READ BACK BY AND VERIFIED WITH: Karlene Einstein PharmD 11:00 01/22/19 ([email or web address])    Streptococcus species NOT DETECTED NOT DETECTED Final   Streptococcus agalactiae NOT DETECTED NOT DETECTED Final   Streptococcus pneumoniae NOT DETECTED NOT DETECTED Final   Streptococcus pyogenes NOT DETECTED NOT DETECTED Final   Acinetobacter baumannii NOT DETECTED NOT DETECTED Final   Enterobacteriaceae species NOT DETECTED NOT DETECTED Final   Enterobacter cloacae complex NOT DETECTED NOT DETECTED Final   Escherichia coli NOT DETECTED NOT DETECTED Final   Klebsiella oxytoca NOT DETECTED NOT DETECTED Final   Klebsiella pneumoniae NOT DETECTED NOT DETECTED Final   Proteus species NOT DETECTED NOT DETECTED Final   Serratia marcescens NOT DETECTED NOT DETECTED Final   Haemophilus influenzae NOT DETECTED NOT DETECTED Final   Neisseria meningitidis NOT DETECTED NOT DETECTED Final   Pseudomonas aeruginosa NOT DETECTED NOT DETECTED Final   Candida albicans NOT DETECTED NOT DETECTED Final   Candida glabrata NOT DETECTED NOT DETECTED Final   Candida krusei NOT DETECTED NOT DETECTED Final   Candida parapsilosis NOT DETECTED NOT DETECTED Final   Candida tropicalis NOT DETECTED NOT DETECTED Final    Comment: Performed at [facility], [place]. [address] [place], [place] [address]  Urine culture     Status: Abnormal   Collection Time: 01/21/19  4:20 PM  Result Value Ref Range Status   Specimen Description URINE, RANDOM  Final   Special Requests NONE  Final   Culture (A)  Final    >=100,000 COLONIES/mL PROTEUS MIRABILIS Susceptibility Pattern Suggests Possibility of an  Extended Spectrum Beta Lactamase Producer. Contact Laboratory Within 7 Days if Confirmation Warranted. Performed at [facility], [place] [address] [place], [place] [address]    Report Status 01/23/2019 FINAL   Final   Organism ID, Bacteria PROTEUS MIRABILIS (A)  Final      Susceptibility   Proteus mirabilis - MIC*    AMPICILLIN >=32 RESISTANT Resistant     CEFAZOLIN >=64 RESISTANT Resistant     CEFTRIAXONE RESISTANT Resistant     CIPROFLOXACIN >=4 RESISTANT Resistant     GENTAMICIN <=1 SENSITIVE Sensitive     IMIPENEM 2 SENSITIVE Sensitive     NITROFURANTOIN 128 RESISTANT Resistant     TRIMETH/SULFA >=320 RESISTANT Resistant     AMPICILLIN/SULBACTAM >=32 RESISTANT Resistant     PIP/TAZO <=4 SENSITIVE Sensitive     * >=100,000 COLONIES/mL PROTEUS MIRABILIS  MRSA PCR Screening     Status: None   Collection Time: 01/21/19  8:35 PM  Result Value Ref Range Status   MRSA by PCR NEGATIVE NEGATIVE Final    Comment:        The GeneXpert MRSA Assay (FDA approved for NASAL specimens only), is one component of a comprehensive MRSA colonization surveillance program. It is not intended to diagnose MRSA infection nor to guide or monitor treatment for MRSA infections. Performed at [facility], [place] [address] [place], [place] [address]   Culture, blood (routine x 2)     Status: None   Collection Time: 01/23/19 11:31 AM  Result Value Ref Range Status   Specimen Description BLOOD RIGHT THUMB  Final   Special Requests   Final    BOTTLES DRAWN AEROBIC ONLY Blood Culture adequate volume Performed at [facility], [place] [address] [place], [place] [address]    Culture NO GROWTH 5 DAYS  Final   Report Status 01/28/2019 FINAL  Final  Culture, blood (routine x 2)     Status: None   Collection Time: 01/23/19 11:37 AM  Result Value Ref Range Status   Specimen Description BLOOD LEFT HAND  Final   Special Requests   Final    BOTTLES DRAWN AEROBIC ONLY Blood Culture results may not be optimal due to an inadequate volume of blood received in culture bottles Performed at [facility], [place] [address] [place], [place] [address]    Culture NO GROWTH 5 DAYS  Final   Report Status  01/28/2019 FINAL  Final    RADIOLOGY STUDIES/RESULTS: Dg Chest Port 1 View  Result Date: 01/21/2019 CLINICAL DATA:  Altered mental status/nonresponsive EXAM: PORTABLE CHEST 1 VIEW COMPARISON:  Chest radiograph December 06, 2018 and chest CT December 07, 2018 FINDINGS: There is no appreciable edema or consolidation. Heart size and pulmonary vascularity are normal. No adenopathy. Patient is undergone previous mid lower thoracic kyphoplasty procedures. There is aortic atherosclerosis. Bones are osteoporotic. IMPRESSION: No edema or consolidation.  Stable cardiac silhouette. Aortic Atherosclerosis (ICD10-I70.0). Electronically Signed   By: Lowella Grip III M.D.   On: 01/21/2019 13:36   [place] Ekg Site Rite  Result Date: 01/26/2019 If Site Rite image not attached, placement could not be confirmed due to current cardiac rhythm.    LOS: 9 days    Signature  Lala Lund M.D on 01/30/2019 at 8:37 AM   -  To page go to [email or web address]

## 2019-01-30 NOTE — Progress Notes (Signed)
  Speech Language Pathology Treatment: Dysphagia  Patient Details Name: [name] MRN: [id number] DOB: [birth year] Today's Date: 01/30/2019 Time: 0950-1000 SLP Time Calculation (min) (ACUTE ONLY): 10 min  Assessment / Plan / Recommendation Clinical Impression  F/u for dysphagia - pt offered trials of regular solids.  Despite confusion/confabulation, swallow function has improved.  Dysphagia is resolved.  Pt presented with functional and active mastication, brisk swallow trigger, and no s/s of aspiration with mixed consistencies (liquids/solids) and serial thin liquid boluses.  Lung sounds are clear; pt is afebrile. No further SLP is needed.  Diet has been advanced to regular solids, thin liquids.  Our service will sign off.  HPI HPI: 73 yr old admitted severly obtunded and hypotensive. PMH: DM, HTN, MI. Pt recently hospitalized with nausea and UTI. This admit found to have Sepsis with hypotension secondary to staph epidermidis bacteremia and ESBL Proteus UTI, Hypokalemia, hyponatremia, acute renal failure.           SLP Plan  All goals met       Recommendations  Diet recommendations: Regular;Thin liquid Liquids provided via: Cup;Straw Medication Administration: Whole meds with liquid Compensations: Minimize environmental distractions                Oral Care Recommendations: Oral care BID SLP Visit Diagnosis: Dysphagia, unspecified (R13.10) Plan: All goals met       GO              Suzann Lazaro L. Tivis Ringer, [place] CCC/SLP Acute Rehabilitation Services Office number [phone] Pager [phone]   [name] 01/30/2019, 10:01 AM

## 2019-01-30 NOTE — Progress Notes (Signed)
Nutrition Follow-up  DOCUMENTATION CODES:   Obesity unspecified  INTERVENTION:  - D/c Glucerna. - Will order snacks.  NUTRITION DIAGNOSIS:   Inadequate oral intake related to nausea as evidenced by per patient/family report.  Ongoing, still experiencing some nausea  GOAL:   Patient will meet greater than or equal to 90% of their needs  Progressing  MONITOR:   PO intake, Supplement acceptance, Labs, Weight trends  ASSESSMENT:   73 year old female who presented to the ED on 2/24 with hypotension. PMH significant for MI/CAD s/o PCI 2014, CHF, T2DM dependent on insulin, HTN, HLD. Pt recently hospitalized 12/06/18-12/11/18 with acute metabolic encephalopathy d/t UTI. Pt admitted with hypovolemic shock, severe dehydration with hyponatremia, sepsis, and acute renal failure.  Acute metabolic encephalopathy improved. Pt having episodes of hypoglycemia. Acute renal failure is resolved. SLP has advanced diet to regular; pt on carb mod.   Spoke with pt at bedside who was very pleasant and feeling well.  Pt reported that PTA she was eating 3 meals a day. Breakfast was often a egg and sausage biscuit. Lunch can be leftovers from dinner or sandwiches. Pt enjoys having meatloaf, pork, chicken, salads, mixed vegetables and fruits for dinner. She likes hamburgers and steaks as well. Pt did not have an issue with her appetite PTA.   Pt denied any wt loss PTA or concerns about her wt. Wt is currently 236# which is 13# higher than last RD visit. However, pt still has ongoing edema.  Pt reported that she is eating about 75% of her meals. She mentioned that her appetite has improved and she feels much better. Pt did endorse nausea earlier today and yesterday. Observed pt had eaten about 25% of a dinner roll with tomatoes on it. Per chart there is not any documented meal completion.   Pt does not enjoy the Glucerna; she thinks it is too sweet. She was not amendable to continuing to drink the Glucerna or  trying other supplements. Reiterated the need for protein and adequate calorie intake to assist with healing; pt kept responding, "I get enough protein don't worry". Per chart pt has refused Glucerna many times; observed unopened bottle on bedside table. Will order pt snacks to help increase intake.   Net I/O: +[phone] Medications reviewed and include: KCl daily Labs reviewed: CBG (54, 19,62,22)  Diet Order:   Diet Order            Diet Carb Modified Fluid consistency: Thin; Room service appropriate? Yes with Assist  Diet effective now              EDUCATION NEEDS:   Education needs have been addressed  Skin:  Skin Assessment: Reviewed RN Assessment  Last BM:  3/3  Height:   Ht Readings from Last 1 Encounters:  01/21/19 5' 8.5" (1.74 m)   Weight:   Wt Readings from Last 1 Encounters:  01/30/19 107 kg    Ideal Body Weight:  64.8 kg  BMI:  Body mass index is 35.35 kg/m.  Estimated Nutritional Needs:   Kcal:  1600-1800  Protein:  90-105 grams  Fluid:  1.6-1.8 L    [employer]

## 2019-01-30 NOTE — Progress Notes (Addendum)
Occupational Therapy Treatment Patient Details Name: [name] MRN: [id number] DOB: [birth year] Today's Date: 01/30/2019    History of present illness  [name] is a 73 year old female with PMH of MI/CAD status post PCI 2878, chronic diastolic CHF, DM 2/IDDM, HTN, HLD, recently hospitalized 12/06/2018-12/11/2018 when she presented with confusion and incontinence after starting meloxicam and baclofen for chest pain, treated in the hospital for acute metabolic encephalopathy due to E. coli UTI and baclofen, suspected mild dementia, musculoskeletal chest pain, discharged to [facility]. Was supposed to go home 2/24 when she was noted to be more confused and very weak. Has been unable to work with therapy at [facility].    OT comments  Pt progressing towards acute OT goals. More alert this session but decreased cognition and deconditioning/fatigue remain. Oriented to self only, confused and some agitation (when attempted bed mobility). Pt remains total A +2 with ADLs and transfers/bed mobility. D/c plan remains appropriate.    Follow Up Recommendations  SNF    Equipment Recommendations  Other (comment)(defer to next venue)    Recommendations for Other Services      Precautions / Restrictions Precautions Precautions: Fall Restrictions Weight Bearing Restrictions: No       Mobility Bed Mobility Overal bed mobility: Needs Assistance Bed Mobility: Supine to Sit     Supine to sit: Total assist;+2 for physical assistance     General bed mobility comments: Pt came to partial EOB position before ending session due to arrival of meal tray. total A +2 for bed mobility.   Transfers                      Balance                                           ADL either performed or assessed with clinical judgement   ADL Overall ADL's : Needs assistance/impaired                                       General ADL Comments: attempted bed  mobility this session. Pt ended session due to arrival of meal tray.      Vision       Perception     Praxis      Cognition Arousal/Alertness: Awake/alert Behavior During Therapy: Anxious;Agitated Overall Cognitive Status: Impaired/Different from baseline Area of Impairment: Orientation;Attention;Memory;Following commands;Safety/judgement;Awareness;Problem solving                 Orientation Level: Place;Time;Situation Current Attention Level: Selective Memory: Decreased short-term memory Following Commands: Follows one step commands inconsistently Safety/Judgement: Decreased awareness of safety;Decreased awareness of deficits Awareness: Intellectual Problem Solving: Decreased initiation;Requires verbal cues;Requires tactile cues;Slow processing;Difficulty sequencing General Comments: Pt more alert and conversational this OT session. Tangential responses, verbal perseveration, internally distracted. Agitated at times. Confused        Exercises     Shoulder Instructions       General Comments      Pertinent Vitals/ Pain       Pain Assessment: Faces Faces Pain Scale: Hurts little more Pain Location: unspecified, grimacing with bed mobility Pain Descriptors / Indicators: Grimacing Pain Intervention(s): Monitored during session;Limited activity within patient's tolerance  Home Living  Prior Functioning/Environment              Frequency  Min 2X/week        Progress Toward Goals  OT Goals(current goals can now be found in the care plan section)  Progress towards OT goals: Progressing toward goals  Acute Rehab OT Goals Patient Stated Goal: to get her Lucianne Lei and get home OT Goal Formulation: Patient unable to participate in goal setting Time For Goal Achievement: 02/06/19 Potential to Achieve Goals: Good ADL Goals Pt Will Perform Grooming: with mod assist;bed level Pt Will Perform Upper Body  Bathing: with mod assist;sitting;bed level Pt Will Perform Lower Body Bathing: with mod assist;sitting/lateral leans Additional ADL Goal #1: Pt will complete bed mobility at mod A +2 to prepare for EOB ADLs.  Plan Discharge plan remains appropriate    Co-evaluation                 AM-PAC OT "6 Clicks" Daily Activity     Outcome Measure   Help from another person eating meals?: Total Help from another person taking care of personal grooming?: Total Help from another person toileting, which includes using toliet, bedpan, or urinal?: Total Help from another person bathing (including washing, rinsing, drying)?: Total Help from another person to put on and taking off regular upper body clothing?: Total Help from another person to put on and taking off regular lower body clothing?: Total 6 Click Score: 6    End of Session    OT Visit Diagnosis: Unsteadiness on feet (R26.81);Muscle weakness (generalized) (M62.81);Pain;Other symptoms and signs involving cognitive function;Cognitive communication deficit (R41.841)   Activity Tolerance Patient limited by fatigue;Patient limited by pain   Patient Left in bed;with call bell/phone within reach;with bed alarm set   Nurse Communication          Time: 1053-1106 OT Time Calculation (min): 13 min  Charges: OT General Charges $OT Visit: 1 Visit OT Treatments $Self Care/Home Management : 8-22 mins  Tyrone Schimke, OT Acute Rehabilitation Services Pager: [phone] Office: [phone]    Hortencia Pilar 01/30/2019, 1:18 PM

## 2019-01-30 NOTE — Progress Notes (Signed)
Pharmacy Antibiotic Note  [name] is a 72 y.o. female admitted on 01/21/2019 with sepsis,  due to staph epidermidis bacteremia and ESBL Proteus UTI.  Repeat Blood cultures on 01/23/19 are negative/final. Completed Meropenem therapy on 01/28/19.  Currently receiving Vancomycin 1000 mg IV q24h.  Vancomycin peak =32 on 01/29/19 @ 20:50 (~5 hr post dose infusion ended) Vanc Trough = 24 @ 14:56 01/30/19 Current AUC 701, which is supratherapeutic.  AKI:  Scr has been trending down , wnl since 2/29,  SCr = 0.86 last checked 01/28/19.   New dose recommendation: adjust dose to 1500mg  q48h (predicted AUC 526)  Plan: Reduce Vancomycin dose to  1500 mg IV q48h, next due 02/01/19 ~18:00. Monitor vancomycin levels per protocol, renal function and clinical status.    Height: 5' 8.5" (174 cm) Weight: 235 lb 14.3 oz (107 kg) IBW/kg (Calculated) : 65.05  Temp (24hrs), Avg:98 F (36.7 C), Min:97.6 F (36.4 C), Max:98.8 F (37.1 C)  Recent Labs  Lab 01/24/19 0444 01/24/19 2032 01/25/19 0439 01/26/19 1109 01/27/19 0334 01/28/19 0405 01/29/19 2050 01/30/19 1456  WBC  --   --   --   --  7.4 7.6  --   --   CREATININE 1.24*  --  1.03* 0.75 0.75 0.86  --   --   VANCOTROUGH  --   --   --   --   --   --   --  24*  VANCOPEAK  --  42*  --   --   --   --  32  --   VANCORANDOM 20  --  32  --   --   --   --   --     Estimated Creatinine Clearance: 74.2 mL/min (by C-G formula based on SCr of 0.86 mg/dL).    Allergies  Allergen Reactions  . Oxycodone Nausea Only    Antimicrobials this admission: 2/24 vancomycin >>  2/24 cefepime >> 2/25 2/27 merrem>>3/2   Dose adjustments this admission:  2/28- Vanc levels 42 and 32 - AUC ~ 800- Reduce to 1 gm Q 24  3/3 VP 32 @ 20:50   ( dose given 3/3 14:26) 3/4 VT 24 @ 1456  (SCr 0.86) Current AUC 701, ke 0.0159, t1/2 = 43.6 , Vd 89.7L New dose recommendation:  1500mg  q48h (predicted AUC 526  Microbiology results: 2/24 blood>>3/4 MRSE 2/24 MRSA>>neg 2/24  urine>>ESBL proteus 2/26 BCx:  negative   Thank you for allowing pharmacy to be part of this patients care team. Nicole Cella, [facility] Pharmacist  [phone] Please check AMION for all [place] phone numbers After 10:00 PM, call [facility] 01/30/2019 4:57 PM

## 2019-01-31 LAB — GLUCOSE, CAPILLARY
GLUCOSE-CAPILLARY: 96 mg/dL (ref 70–99)
Glucose-Capillary: 66 mg/dL — ABNORMAL LOW (ref 70–99)
Glucose-Capillary: 113 mg/dL — ABNORMAL HIGH (ref 70–99)

## 2019-01-31 LAB — CREATININE, SERUM
Creatinine, Ser: 0.76 mg/dL (ref 0.44–1.00)
GFR calc Af Amer: >60 mL/min (ref >60)
GFR calc non Af Amer: >60 mL/min (ref >60)

## 2019-01-31 NOTE — Progress Notes (Addendum)
Physical Therapy Treatment Patient Details Name: [name] MRN: [id number] DOB: 1945-12-29 Today's Date: 01/31/2019    History of Present Illness Pt is a 73 y.o. female admitted 01/21/19 with confusion, incontinence and chest pain. Worked up for sepsis with hypotension and metabolic encephalopathy secondary to staph epidermidis bacteremia and ESBL Proteus UTI. Suspected mild dementia. PMH includes CAD, CHF, DM2, HTN, HLD; recent hospitalizaiton s1/9-1/14/20.   PT Comments    Pt requiring totalA for bed mobility and repositioning. Pt remains internally distracted and tangential with speech, requiring consistent redirection to task and conversation. Oriented to self and time, but confused regarding current situation despite reorientation. Session limited by pt needing to have BM, requiring totalA for bed pan placement and pericare; HR 79-156 with this, educ pt not to strain. Recommend nursing staff continue using maximove lift for OOB transfers.   Follow Up Recommendations  SNF;Supervision/Assistance - 24 hour     Equipment Recommendations  None recommended by PT    Recommendations for Other Services       Precautions / Restrictions Precautions Precautions: Fall Restrictions Weight Bearing Restrictions: No    Mobility  Bed Mobility Overal bed mobility: Needs Assistance Bed Mobility: Rolling Rolling: Total assist;+2 for physical assistance         General bed mobility comments: When preparing to sit, pt reporting need for BM; declined transfer to [facility] requesting bed pan. TotalA to roll to R-side for bed pan placement  Transfers                    Ambulation/Gait                 Stairs             Wheelchair Mobility    Modified Rankin (Stroke Patients Only)       Balance       Sitting balance - Comments: unable to assess during treatment       Standing balance comment: unable to assess during treatment                            Cognition Arousal/Alertness: Awake/alert Behavior During Therapy: Anxious;Restless Overall Cognitive Status: No family/caregiver present to determine baseline cognitive functioning Area of Impairment: Orientation;Attention;Memory;Following commands;Safety/judgement;Awareness;Problem solving                 Orientation Level: Disoriented to;Situation;Place Current Attention Level: Selective Memory: Decreased short-term memory Following Commands: Follows one step commands inconsistently Safety/Judgement: Decreased awareness of safety;Decreased awareness of deficits Awareness: Intellectual Problem Solving: Decreased initiation;Requires verbal cues;Requires tactile cues;Slow processing;Difficulty sequencing General Comments: Pt agreeable and pleasant. Very internally distracted throughout session and tangential with speech; required multiple redirects to task and current conversation. "Will you go to the back or front door to see if my friend dropped that package off for me"      Exercises      General Comments        Pertinent Vitals/Pain Pain Assessment: No/denies pain    Home Living                      Prior Function            PT Goals (current goals can now be found in the care plan section) Acute Rehab PT Goals Patient Stated Goal: None stated PT Goal Formulation: With patient Time For Goal Achievement: 02/05/19 Potential to Achieve Goals: Poor Progress towards PT goals:  Not progressing toward goals - comment    Frequency    Min 2X/week      PT Plan Current plan remains appropriate    Co-evaluation              AM-PAC PT "6 Clicks" Mobility   Outcome Measure  Help needed turning from your back to your side while in a flat bed without using bedrails?: Total Help needed moving from lying on your back to sitting on the side of a flat bed without using bedrails?: Total Help needed moving to and from a bed to a chair (including a  wheelchair)?: Total Help needed standing up from a chair using your arms (e.g., wheelchair or bedside chair)?: Total Help needed to walk in hospital room?: Total   6 Click Score: 5    End of Session   Activity Tolerance: Other (comment) Patient left: in bed;with bed alarm set;with call bell/phone within reach Nurse Communication: Mobility status(currently using bed pain) PT Visit Diagnosis: Muscle weakness (generalized) (M62.81)     Time: 2376-2831 PT Time Calculation (min) (ACUTE ONLY): 19 min  Charges:  $Therapeutic Activity: 8-22 mins                    Mabeline Caras, PT, DPT Acute Rehabilitation Services  Pager [phone] Office [place] 01/31/2019, 5:04 PM

## 2019-01-31 NOTE — Progress Notes (Signed)
PROGRESS NOTE        PATIENT DETAILS Name: [name] Age: 73 y.o. Sex: female Date of Birth: 1946-09-18 Admit Date: 01/21/2019 Admitting Physician Modena Jansky, MD [id number], [name], MD  Brief Narrative:  Patient is a 73 y.o. female with prior history of CAD status post PCI in 6440, chronic diastolic heart failure, DM-2, hypertension, dyslipidemia presenting with acute metabolic encephalopathy secondary to acute kidney injury and sepsis with hypotension from gram-positive bacteremia.  See below for further details  Subjective:  Patient in bed, appears comfortable, denies any headache, no fever, no chest pain or pressure, no shortness of breath , no abdominal pain. No focal weakness.   Assessment/Plan:  Sepsis with hypotension secondary to staph epidermidis bacteremia and ESBL Proteus UTI: She had 3 out of 4 bottles positive for staph epidermidis, urinary retention with ESBL Proteus UTI.  Seen by ID.  Currently on vancomycin and meropenem.  Sepsis pathophysiology has resolved, repeat blood cultures drawn on 01/23/2019 are so far negative.  Echo done 12/08/2018 unremarkable.  Per ID total 10 days of IV vancomycin with stop date being 02/02/2019 2010 days from the last negative cultures, meropenem stop date 01/28/2019.  Acute renal failure: Due to sepsis along with home use of ACE inhibitor, NSAIDs, resolved after hydration, continue to hold offending medications.    Anion gap metabolic acidosis: To #1 and #2 above.  Resolved.  Acute metabolic encephalopathy: Due to sepsis and renal failure, much improved, nonfocal exam.  Continue supportive care.  Minimize narcotic and benzodiazepine use.  CAD s/p PCI 2014: Supportive care-no obvious anginal symptoms present.  Hypertension: Blood pressure controlled low-dose metoprolol  Runs of narrow complex tachycardia/SVT: Stable with low-dose metoprolol.   Chronic diastolic heart failure: Volume status  stable-monitor closely while on IV fluids.  Continue to hold diuretics for now.  Large multinodular goiter: Stable for outpatient follow-up-this is a chronic issue.  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  Dysphagia: SLP following advanced to dysphagia 3 diet.  Will monitor.  Mild antibiotic induced diarrhea.  No fever or leukocytosis.  Imodium.  Bladder outlet obstruction.  Continue Flomax, Trial of Foley removal on 01/28/2019 will monitor.  Insulin-dependent DM-2: Home dose Actos has been held, with just low-dose sliding scale 3 times a day with meals she is still becoming hypoglycemic early in the morning, will discontinue sliding scale and monitor on low carb diet only.  Continue checking CBGs for now.  Lab Results  Component Value Date   HGBA1C 7.2 (H) 01/30/2019   CBG (last 3)  Recent Labs    01/30/19 1717 01/30/19 2321 01/31/19 0824  GLUCAP 115* 86 66*      DVT Prophylaxis: Prophylactic Heparin   Code Status: DNR  Family Communication: None at bedside  Disposition Plan: Remain inpatient SNF on discharge-likely next week  Antimicrobial agents: Anti-infectives (From admission, onward)   Start     Dose/Rate Route Frequency Ordered Stop   02/01/19 1800  vancomycin (VANCOCIN) 1,500 mg in sodium chloride 0.9 % 500 mL IVPB     1,500 mg 250 mL/hr over 120 Minutes Intravenous Every 48 hours 01/30/19 1656     01/25/19 1500  vancomycin (VANCOCIN) IVPB 1000 mg/200 mL premix  Status:  Discontinued     1,000 mg 200 mL/hr over 60 Minutes Intravenous Every 24 hours 01/25/19 0947 01/30/19  1656   01/25/19 1400  meropenem (MERREM) 1 g in sodium chloride 0.9 % 100 mL IVPB     1 g 200 mL/hr over 30 Minutes Intravenous Every 8 hours 01/25/19 0947 01/28/19 1444   01/24/19 1500  vancomycin (VANCOCIN) 1,500 mg in sodium chloride 0.9 % 500 mL IVPB  Status:  Discontinued     1,500 mg 250 mL/hr over 120 Minutes Intravenous Every 24  hours 01/24/19 1403 01/25/19 0947   01/23/19 2330  meropenem (MERREM) 1 g in sodium chloride 0.9 % 100 mL IVPB  Status:  Discontinued     1 g 200 mL/hr over 30 Minutes Intravenous Every 12 hours 01/23/19 2321 01/25/19 0947   01/22/19 1200  ceFEPIme (MAXIPIME) 1 g in sodium chloride 0.9 % 100 mL IVPB  Status:  Discontinued     1 g 200 mL/hr over 30 Minutes Intravenous Every 24 hours 01/21/19 1446 01/22/19 0956   01/21/19 1446  vancomycin variable dose per unstable renal function (pharmacist dosing)  Status:  Discontinued      Does not apply See admin instructions 01/21/19 1446 01/22/19 0941   01/21/19 1415  ceFEPIme (MAXIPIME) 2 g in sodium chloride 0.9 % 100 mL IVPB     2 g 200 mL/hr over 30 Minutes Intravenous  Once 01/21/19 1403 01/21/19 1449   01/21/19 1415  vancomycin (VANCOCIN) IVPB 1000 mg/200 mL premix  Status:  Discontinued     1,000 mg 200 mL/hr over 60 Minutes Intravenous  Once 01/21/19 1403 01/21/19 1411   01/21/19 1415  vancomycin (VANCOCIN) 2,500 mg in sodium chloride 0.9 % 500 mL IVPB     2,500 mg 250 mL/hr over 120 Minutes Intravenous  Once 01/21/19 1411 01/21/19 1937      Procedures: None  CONSULTS:  None  Time spent: 25 minutes-Greater than 50% of this time was spent in counseling, explanation of diagnosis, planning of further management, and coordination of care.  MEDICATIONS: Scheduled Meds: . heparin  5,000 Units Subcutaneous Q8H  . metoprolol tartrate  50 mg Oral BID  . potassium chloride  40 mEq Oral Daily  . sodium chloride flush  3 mL Intravenous Q12H  . tamsulosin  0.4 mg Oral Daily   Continuous Infusions: . [START ON 02/01/2019] vancomycin     PRN Meds:.acetaminophen **OR** [DISCONTINUED] acetaminophen, albuterol, loperamide, metoprolol tartrate, ondansetron (ZOFRAN) IV, senna-docusate   PHYSICAL EXAM: Vital signs: Vitals:   01/30/19 2153 01/31/19 0005 01/31/19 0312 01/31/19 0611  BP:    (!) 118/55  Pulse:    86  Resp:      Temp: 97.7 F  (36.5 C) 97.6 F (36.4 C)    TempSrc: Oral Oral    SpO2:      Weight:   106.6 kg   Height:       Filed Weights   01/29/19 0338 01/30/19 0138 01/31/19 0312  Weight: 104.4 kg 107 kg 106.6 kg   Body mass index is 35.21 kg/m.   Exam  Awake Alert, Oriented X 3, No new F.N deficits, Normal affect Cook.AT,PERRAL Supple Neck,No JVD, No cervical lymphadenopathy appriciated.  Symmetrical Chest wall movement, Good air movement bilaterally, CTAB RRR,No Gallops, Rubs or new Murmurs, No Parasternal Heave +ve B.Sounds, Abd Soft, No tenderness, No organomegaly appriciated, No rebound - guarding or rigidity. No Cyanosis, Clubbing or edema, says she has chronically swollen enlarged feet and legs, bilat heal bandages   I have personally reviewed following labs and imaging studies  LABORATORY DATA: CBC: Recent Labs  Lab 01/27/19  0334 01/28/19 0405  WBC 7.4 7.6  HGB 10.7* 11.4*  HCT 33.6* 36.8  MCV 96.0 95.3  PLT 124* 127*    Basic Metabolic Panel: Recent Labs  Lab 01/25/19 0439 01/26/19 1109 01/27/19 0334 01/28/19 0405 01/29/19 0424 01/31/19 0444  NA 149* 146* 141 141  --   --   K 2.8* 3.4* 3.8 4.4  --   --   CL 107 106 105 106  --   --   CO2 31 32 31 30  --   --   GLUCOSE 156* 122* 93 82  --   --   BUN 48* 31* 27* 24*  --   --   CREATININE 1.03* 0.75 0.75 0.86  --  0.76  CALCIUM 7.7* 8.4* 8.3* 8.4*  --   --   MG 1.4* 2.1 1.8 1.6* 2.0  --     GFR: Estimated Creatinine Clearance: 79.6 mL/min (by C-G formula based on SCr of 0.76 mg/dL).  Liver Function Tests: No results for input(s): AST, ALT, ALKPHOS, BILITOT, PROT, ALBUMIN in the last 168 hours. No results for input(s): LIPASE, AMYLASE in the last 168 hours. No results for input(s): AMMONIA in the last 168 hours.  Coagulation Profile: No results for input(s): INR, PROTIME in the last 168 hours.  Cardiac Enzymes: No results for input(s): CKTOTAL, CKMB, CKMBINDEX, TROPONINI in the last 168 hours.  BNP (last 3  results) No results for input(s): PROBNP in the last 8760 hours.  HbA1C: Recent Labs    01/30/19 1456  HGBA1C 7.2*    CBG: Recent Labs  Lab 01/30/19 1022 01/30/19 1258 01/30/19 1717 01/30/19 2321 01/31/19 0824  GLUCAP 68* 95 115* 86 66*    Lipid Profile: No results for input(s): CHOL, HDL, LDLCALC, TRIG, CHOLHDL, LDLDIRECT in the last 72 hours.  Thyroid Function Tests: No results for input(s): TSH, T4TOTAL, FREET4, T3FREE, THYROIDAB in the last 72 hours.  Anemia Panel: No results for input(s): VITAMINB12, FOLATE, FERRITIN, TIBC, IRON, RETICCTPCT in the last 72 hours.  Urine analysis:    Component Value Date/Time   COLORURINE YELLOW 01/21/2019 1620   APPEARANCEUR TURBID (A) 01/21/2019 1620   LABSPEC 1.020 01/21/2019 1620   PHURINE 8.0 01/21/2019 1620   GLUCOSEU NEGATIVE 01/21/2019 1620   HGBUR MODERATE (A) 01/21/2019 1620   BILIRUBINUR NEGATIVE 01/21/2019 1620   KETONESUR NEGATIVE 01/21/2019 1620   PROTEINUR 100 (A) 01/21/2019 1620   UROBILINOGEN 1.0 06/28/2015 1552   NITRITE NEGATIVE 01/21/2019 1620   LEUKOCYTESUR LARGE (A) 01/21/2019 1620    Sepsis Labs: Lactic Acid, Venous    Component Value Date/Time   LATICACIDVEN 2.5 (HH) 01/21/2019 1718    MICROBIOLOGY: Recent Results (from the past 240 hour(s))  Blood Culture (routine x 2)     Status: Abnormal   Collection Time: 01/21/19  1:35 PM  Result Value Ref Range Status   Specimen Description BLOOD RIGHT ANTECUBITAL  Final   Special Requests   Final    BOTTLES DRAWN AEROBIC AND ANAEROBIC Blood Culture adequate volume   Culture  Setup Time   Final    GRAM POSITIVE COCCI IN BOTH AEROBIC AND ANAEROBIC BOTTLES CRITICAL RESULT CALLED TO, READ BACK BY AND VERIFIED WITH: Karlene Einstein PharmD 11:00 01/22/19 ([email or web address]) Performed at [facility], [place] [address], [place], [place] [address]    Culture STAPHYLOCOCCUS EPIDERMIDIS (A)  Final   Report Status 01/24/2019 FINAL  Final   Organism ID, Bacteria  STAPHYLOCOCCUS EPIDERMIDIS  Final      Susceptibility  Staphylococcus epidermidis - MIC*    CIPROFLOXACIN >=8 RESISTANT Resistant     ERYTHROMYCIN >=8 RESISTANT Resistant     GENTAMICIN <=0.5 SENSITIVE Sensitive     OXACILLIN >=4 RESISTANT Resistant     TETRACYCLINE 2 SENSITIVE Sensitive     VANCOMYCIN 1 SENSITIVE Sensitive     TRIMETH/SULFA 80 RESISTANT Resistant     CLINDAMYCIN >=8 RESISTANT Resistant     RIFAMPIN <=0.5 SENSITIVE Sensitive     Inducible Clindamycin NEGATIVE Sensitive     * STAPHYLOCOCCUS EPIDERMIDIS  Blood Culture (routine x 2)     Status: Abnormal   Collection Time: 01/21/19  1:35 PM  Result Value Ref Range Status   Specimen Description BLOOD RIGHT ANTECUBITAL  Final   Special Requests   Final    BOTTLES DRAWN AEROBIC AND ANAEROBIC Blood Culture adequate volume   Culture  Setup Time   Final    GRAM POSITIVE COCCI ANAEROBIC BOTTLE ONLY CRITICAL VALUE NOTED.  VALUE IS CONSISTENT WITH PREVIOUSLY REPORTED AND CALLED VALUE.    Culture (A)  Final    STAPHYLOCOCCUS EPIDERMIDIS SUSCEPTIBILITIES PERFORMED ON PREVIOUS CULTURE WITHIN THE LAST 5 DAYS. Performed at [facility], [place] [address], [place], [place] [address]    Report Status 01/24/2019 FINAL  Final  Blood Culture ID Panel (Reflexed)     Status: Abnormal   Collection Time: 01/21/19  1:35 PM  Result Value Ref Range Status   Enterococcus species NOT DETECTED NOT DETECTED Final   Listeria monocytogenes NOT DETECTED NOT DETECTED Final   Staphylococcus species DETECTED (A) NOT DETECTED Final    Comment: Methicillin (oxacillin) resistant coagulase negative staphylococcus. Possible blood culture contaminant (unless isolated from more than one blood culture draw or clinical case suggests pathogenicity). No antibiotic treatment is indicated for blood  culture contaminants. CRITICAL RESULT CALLED TO, READ BACK BY AND VERIFIED WITH: Karlene Einstein PharmD 11:00 01/22/19 ([email or web address])    Staphylococcus aureus (BCID)  NOT DETECTED NOT DETECTED Final   Methicillin resistance DETECTED (A) NOT DETECTED Final    Comment: CRITICAL RESULT CALLED TO, READ BACK BY AND VERIFIED WITH: Karlene Einstein PharmD 11:00 01/22/19 ([email or web address])    Streptococcus species NOT DETECTED NOT DETECTED Final   Streptococcus agalactiae NOT DETECTED NOT DETECTED Final   Streptococcus pneumoniae NOT DETECTED NOT DETECTED Final   Streptococcus pyogenes NOT DETECTED NOT DETECTED Final   Acinetobacter baumannii NOT DETECTED NOT DETECTED Final   Enterobacteriaceae species NOT DETECTED NOT DETECTED Final   Enterobacter cloacae complex NOT DETECTED NOT DETECTED Final   Escherichia coli NOT DETECTED NOT DETECTED Final   Klebsiella oxytoca NOT DETECTED NOT DETECTED Final   Klebsiella pneumoniae NOT DETECTED NOT DETECTED Final   Proteus species NOT DETECTED NOT DETECTED Final   Serratia marcescens NOT DETECTED NOT DETECTED Final   Haemophilus influenzae NOT DETECTED NOT DETECTED Final   Neisseria meningitidis NOT DETECTED NOT DETECTED Final   Pseudomonas aeruginosa NOT DETECTED NOT DETECTED Final   Candida albicans NOT DETECTED NOT DETECTED Final   Candida glabrata NOT DETECTED NOT DETECTED Final   Candida krusei NOT DETECTED NOT DETECTED Final   Candida parapsilosis NOT DETECTED NOT DETECTED Final   Candida tropicalis NOT DETECTED NOT DETECTED Final    Comment: Performed at [facility], [place]. [address], [place], [place] [address]  Urine culture     Status: Abnormal   Collection Time: 01/21/19  4:20 PM  Result Value Ref Range Status   Specimen Description URINE, RANDOM  Final   Special  Requests NONE  Final   Culture (A)  Final    >=100,000 COLONIES/mL PROTEUS MIRABILIS Susceptibility Pattern Suggests Possibility of an Extended Spectrum Beta Lactamase Producer. Contact Laboratory Within 7 Days if Confirmation Warranted. Performed at [facility], [place] [address], [place], [place] [address]    Report Status 01/23/2019 FINAL  Final    Organism ID, Bacteria PROTEUS MIRABILIS (A)  Final      Susceptibility   Proteus mirabilis - MIC*    AMPICILLIN >=32 RESISTANT Resistant     CEFAZOLIN >=64 RESISTANT Resistant     CEFTRIAXONE RESISTANT Resistant     CIPROFLOXACIN >=4 RESISTANT Resistant     GENTAMICIN <=1 SENSITIVE Sensitive     IMIPENEM 2 SENSITIVE Sensitive     NITROFURANTOIN 128 RESISTANT Resistant     TRIMETH/SULFA >=320 RESISTANT Resistant     AMPICILLIN/SULBACTAM >=32 RESISTANT Resistant     PIP/TAZO <=4 SENSITIVE Sensitive     * >=100,000 COLONIES/mL PROTEUS MIRABILIS  MRSA PCR Screening     Status: None   Collection Time: 01/21/19  8:35 PM  Result Value Ref Range Status   MRSA by PCR NEGATIVE NEGATIVE Final    Comment:        The GeneXpert MRSA Assay (FDA approved for NASAL specimens only), is one component of a comprehensive MRSA colonization surveillance program. It is not intended to diagnose MRSA infection nor to guide or monitor treatment for MRSA infections. Performed at [facility], [place] [address] [place], [place] [address]   Culture, blood (routine x 2)     Status: None   Collection Time: 01/23/19 11:31 AM  Result Value Ref Range Status   Specimen Description BLOOD RIGHT THUMB  Final   Special Requests   Final    BOTTLES DRAWN AEROBIC ONLY Blood Culture adequate volume Performed at [facility], [place] [address] [place], [place] [address]    Culture NO GROWTH 5 DAYS  Final   Report Status 01/28/2019 FINAL  Final  Culture, blood (routine x 2)     Status: None   Collection Time: 01/23/19 11:37 AM  Result Value Ref Range Status   Specimen Description BLOOD LEFT HAND  Final   Special Requests   Final    BOTTLES DRAWN AEROBIC ONLY Blood Culture results may not be optimal due to an inadequate volume of blood received in culture bottles Performed at [facility], [place] [address], [place],  [address]    Culture NO GROWTH 5 DAYS  Final   Report Status 01/28/2019  FINAL  Final    RADIOLOGY STUDIES/RESULTS: Dg Chest Port 1 View  Result Date: 01/21/2019 CLINICAL DATA:  Altered mental status/nonresponsive EXAM: PORTABLE CHEST 1 VIEW COMPARISON:  Chest radiograph December 06, 2018 and chest CT December 07, 2018 FINDINGS: There is no appreciable edema or consolidation. Heart size and pulmonary vascularity are normal. No adenopathy. Patient is undergone previous mid lower thoracic kyphoplasty procedures. There is aortic atherosclerosis. Bones are osteoporotic. IMPRESSION: No edema or consolidation.  Stable cardiac silhouette. Aortic Atherosclerosis (ICD10-I70.0). Electronically Signed   By: Lowella Grip III M.D.   On: 01/21/2019 13:36   [place] Ekg Site Rite  Result Date: 01/26/2019 If Site Rite image not attached, placement could not be confirmed due to current cardiac rhythm.    LOS: 10 days    Signature  Lala Lund M.D on 01/31/2019 at 10:15 AM   -  To page go to [email or web address]

## 2019-02-01 LAB — GLUCOSE, CAPILLARY
GLUCOSE-CAPILLARY: 86 mg/dL (ref 70–99)
Glucose-Capillary: 87 mg/dL (ref 70–99)
Glucose-Capillary: 130 mg/dL — ABNORMAL HIGH (ref 70–99)
Glucose-Capillary: 121 mg/dL — ABNORMAL HIGH (ref 70–99)

## 2019-02-01 NOTE — Progress Notes (Signed)
Went to perform routine PICC line check and PICC line not in patient. Patient states she pulled it out and it was on the floor. PICC line not on floor, unable to determine if was intact when she pulled it out. Site covered with vaseline gauze and gauze. Caryl Comes, RN made aware.

## 2019-02-01 NOTE — Progress Notes (Signed)
Pharmacy Antibiotic Note  [name] is a 73 y.o. female admitted on 01/21/2019 with sepsis,  due to staph epidermidis bacteremia and ESBL Proteus UTI.  Repeat Blood cultures on 01/23/19 are negative/final.   Completed Meropenem therapy on 01/28/19.   Continues on Vancomycin for staph bacteremia - end date 3/10   Plan: Continue Vancomycin 1500 mg iv Q 48 hours, ends 3/10 Monitor vancomycin levels per protocol, renal function and clinical status.    Height: 5' 8.5" (174 cm) Weight: 235 lb 7.2 oz (106.8 kg) IBW/kg (Calculated) : 65.05  Temp (24hrs), Avg:97.8 F (36.6 C), Min:97.8 F (36.6 C), Max:97.9 F (36.6 C)  Recent Labs  Lab 01/26/19 1109 01/27/19 0334 01/28/19 0405 01/29/19 2050 01/30/19 1456 01/31/19 0444  WBC  --  7.4 7.6  --   --   --   CREATININE 0.75 0.75 0.86  --   --  0.76  VANCOTROUGH  --   --   --   --  24*  --   VANCOPEAK  --   --   --  32  --   --     Estimated Creatinine Clearance: 79.7 mL/min (by C-G formula based on SCr of 0.76 mg/dL).    Allergies  Allergen Reactions  . Oxycodone Nausea Only    Thank you Anette Guarneri, PharmD [phone] Please check AMION for all [facility] phone numbers After 10:00 PM, call [facility] [phone] 02/01/2019 10:04 AM

## 2019-02-01 NOTE — Progress Notes (Signed)
PROGRESS NOTE        PATIENT DETAILS Name: [name] Age: 73 y.o. Sex: female Date of Birth: 1946-01-02 Admit Date: 01/21/2019 Admitting Physician Modena Jansky, MD [id number], [name], MD  Brief Narrative:  Patient is a 73 y.o. female with prior history of CAD status post PCI in 7564, chronic diastolic heart failure, DM-2, hypertension, dyslipidemia presenting with acute metabolic encephalopathy secondary to acute kidney injury and sepsis with hypotension from gram-positive bacteremia.  See below for further details  Subjective:  Patient in bed, appears comfortable, denies any headache, no fever, no chest pain or pressure, no shortness of breath , no abdominal pain. No focal weakness.  Assessment/Plan:  Sepsis with hypotension secondary to staph epidermidis bacteremia and ESBL Proteus UTI: She had 3 out of 4 bottles positive for staph epidermidis, urinary retention with ESBL Proteus UTI.  Seen by ID.  Currently on vancomycin and meropenem.  Sepsis pathophysiology has resolved, repeat blood cultures drawn on 01/23/2019 are so far negative.  Echo done 12/08/2018 unremarkable.  Per ID total 10 days of IV vancomycin with stop date being 02/02/2019 2010 days from the last negative cultures, meropenem stop date 01/28/2019.  Acute renal failure: Due to sepsis along with home use of ACE inhibitor, NSAIDs, resolved after hydration, continue to hold offending medications.    Anion gap metabolic acidosis: To #1 and #2 above.  Resolved.  Acute metabolic encephalopathy: Due to sepsis and renal failure, much improved, nonfocal exam.  Continue supportive care.  Minimize narcotic and benzodiazepine use.  CAD s/p PCI 2014: Supportive care-no obvious anginal symptoms present.  Hypertension: Blood pressure controlled low-dose metoprolol  Runs of narrow complex tachycardia/SVT: Stable with low-dose metoprolol.   Chronic diastolic heart failure: Volume status stable-monitor  closely while on IV fluids.  Continue to hold diuretics for now.  Large multinodular goiter: Stable for outpatient follow-up-this is a chronic issue.  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  Dysphagia: SLP following advanced to dysphagia 3 diet.  Will monitor.  Mild antibiotic induced diarrhea.  No fever or leukocytosis.  Imodium.  Bladder outlet obstruction.  Continue Flomax, Trial of Foley removal on 01/28/2019 will monitor.  Insulin-dependent DM-2: Home dose Actos has been held, with just low-dose sliding scale 3 times a day with meals she is still becoming hypoglycemic early in the morning, will discontinue sliding scale and monitor on low carb diet only.  Continue checking CBGs for now.  Lab Results  Component Value Date   HGBA1C 7.2 (H) 01/30/2019   CBG (last 3)  Recent Labs    01/31/19 0824 01/31/19 1238 01/31/19 1742  GLUCAP 66* 96 113*      DVT Prophylaxis: Prophylactic Heparin   Code Status: DNR  Family Communication: None at bedside  Disposition Plan: Remain inpatient SNF on discharge-likely next week  Antimicrobial agents:  Anti-infectives (From admission, onward)   Start     Dose/Rate Route Frequency Ordered Stop   02/01/19 1800  vancomycin (VANCOCIN) 1,500 mg in sodium chloride 0.9 % 500 mL IVPB     1,500 mg 250 mL/hr over 120 Minutes Intravenous Every 48 hours 01/30/19 1656 02/05/19 2359   01/25/19 1500  vancomycin (VANCOCIN) IVPB 1000 mg/200 mL premix  Status:  Discontinued     1,000 mg 200 mL/hr over 60 Minutes Intravenous Every 24 hours 01/25/19 0947 01/30/19  1656   01/25/19 1400  meropenem (MERREM) 1 g in sodium chloride 0.9 % 100 mL IVPB     1 g 200 mL/hr over 30 Minutes Intravenous Every 8 hours 01/25/19 0947 01/28/19 1444   01/24/19 1500  vancomycin (VANCOCIN) 1,500 mg in sodium chloride 0.9 % 500 mL IVPB  Status:  Discontinued     1,500 mg 250 mL/hr over 120 Minutes Intravenous Every 24  hours 01/24/19 1403 01/25/19 0947   01/23/19 2330  meropenem (MERREM) 1 g in sodium chloride 0.9 % 100 mL IVPB  Status:  Discontinued     1 g 200 mL/hr over 30 Minutes Intravenous Every 12 hours 01/23/19 2321 01/25/19 0947   01/22/19 1200  ceFEPIme (MAXIPIME) 1 g in sodium chloride 0.9 % 100 mL IVPB  Status:  Discontinued     1 g 200 mL/hr over 30 Minutes Intravenous Every 24 hours 01/21/19 1446 01/22/19 0956   01/21/19 1446  vancomycin variable dose per unstable renal function (pharmacist dosing)  Status:  Discontinued      Does not apply See admin instructions 01/21/19 1446 01/22/19 0941   01/21/19 1415  ceFEPIme (MAXIPIME) 2 g in sodium chloride 0.9 % 100 mL IVPB     2 g 200 mL/hr over 30 Minutes Intravenous  Once 01/21/19 1403 01/21/19 1449   01/21/19 1415  vancomycin (VANCOCIN) IVPB 1000 mg/200 mL premix  Status:  Discontinued     1,000 mg 200 mL/hr over 60 Minutes Intravenous  Once 01/21/19 1403 01/21/19 1411   01/21/19 1415  vancomycin (VANCOCIN) 2,500 mg in sodium chloride 0.9 % 500 mL IVPB     2,500 mg 250 mL/hr over 120 Minutes Intravenous  Once 01/21/19 1411 01/21/19 1937      Procedures: None  CONSULTS:  None  Time spent: 25 minutes-Greater than 50% of this time was spent in counseling, explanation of diagnosis, planning of further management, and coordination of care.  MEDICATIONS: Scheduled Meds: . heparin  5,000 Units Subcutaneous Q8H  . metoprolol tartrate  50 mg Oral BID  . potassium chloride  40 mEq Oral Daily  . sodium chloride flush  3 mL Intravenous Q12H  . tamsulosin  0.4 mg Oral Daily   Continuous Infusions: . vancomycin     PRN Meds:.acetaminophen **OR** [DISCONTINUED] acetaminophen, albuterol, loperamide, metoprolol tartrate, ondansetron (ZOFRAN) IV, senna-docusate   PHYSICAL EXAM: Vital signs: Vitals:   01/31/19 1429 01/31/19 1630 01/31/19 2236 02/01/19 0525  BP: 114/71 114/71 110/83 (!) 124/47  Pulse:  85 79 88  Resp: (!) 21 19 18 18     Temp: 97.8 F (36.6 C)  97.8 F (36.6 C) 97.9 F (36.6 C)  TempSrc: Oral  Oral Oral  SpO2: 94% 98% 99% 92%  Weight:    106.8 kg  Height:       Filed Weights   01/30/19 0138 01/31/19 0312 02/01/19 0525  Weight: 107 kg 106.6 kg 106.8 kg   Body mass index is 35.28 kg/m.   Exam  Awake Alert, Oriented X 3, No new F.N deficits, Normal affect [place].AT,PERRAL Supple Neck,No JVD, No cervical lymphadenopathy appriciated.  Symmetrical Chest wall movement, Good air movement bilaterally, CTAB RRR,No Gallops, Rubs or new Murmurs, No Parasternal Heave +ve B.Sounds, Abd Soft, No tenderness, No organomegaly appriciated, No rebound - guarding or rigidity. No Cyanosis, Clubbing or edema, No new Rash or bruise, bilat heal bandages   I have personally reviewed following labs and imaging studies  LABORATORY DATA: CBC: Recent Labs  Lab 01/27/19 0334 01/28/19  0405  WBC 7.4 7.6  HGB 10.7* 11.4*  HCT 33.6* 36.8  MCV 96.0 95.3  PLT 124* 127*    Basic Metabolic Panel: Recent Labs  Lab 01/26/19 1109 01/27/19 0334 01/28/19 0405 01/29/19 0424 01/31/19 0444  NA 146* 141 141  --   --   K 3.4* 3.8 4.4  --   --   CL 106 105 106  --   --   CO2 32 31 30  --   --   GLUCOSE 122* 93 82  --   --   BUN 31* 27* 24*  --   --   CREATININE 0.75 0.75 0.86  --  0.76  CALCIUM 8.4* 8.3* 8.4*  --   --   MG 2.1 1.8 1.6* 2.0  --     GFR: Estimated Creatinine Clearance: 79.7 mL/min (by C-G formula based on SCr of 0.76 mg/dL).  Liver Function Tests: No results for input(s): AST, ALT, ALKPHOS, BILITOT, PROT, ALBUMIN in the last 168 hours. No results for input(s): LIPASE, AMYLASE in the last 168 hours. No results for input(s): AMMONIA in the last 168 hours.  Coagulation Profile: No results for input(s): INR, PROTIME in the last 168 hours.  Cardiac Enzymes: No results for input(s): CKTOTAL, CKMB, CKMBINDEX, TROPONINI in the last 168 hours.  BNP (last 3 results) No results for input(s): PROBNP in the  last 8760 hours.  HbA1C: Recent Labs    01/30/19 1456  HGBA1C 7.2*    CBG: Recent Labs  Lab 01/30/19 1717 01/30/19 2321 01/31/19 0824 01/31/19 1238 01/31/19 1742  GLUCAP 115* 86 66* 96 113*    Lipid Profile: No results for input(s): CHOL, HDL, LDLCALC, TRIG, CHOLHDL, LDLDIRECT in the last 72 hours.  Thyroid Function Tests: No results for input(s): TSH, T4TOTAL, FREET4, T3FREE, THYROIDAB in the last 72 hours.  Anemia Panel: No results for input(s): VITAMINB12, FOLATE, FERRITIN, TIBC, IRON, RETICCTPCT in the last 72 hours.  Urine analysis:    Component Value Date/Time   COLORURINE YELLOW 01/21/2019 1620   APPEARANCEUR TURBID (A) 01/21/2019 1620   LABSPEC 1.020 01/21/2019 1620   PHURINE 8.0 01/21/2019 1620   GLUCOSEU NEGATIVE 01/21/2019 1620   HGBUR MODERATE (A) 01/21/2019 1620   BILIRUBINUR NEGATIVE 01/21/2019 1620   KETONESUR NEGATIVE 01/21/2019 1620   PROTEINUR 100 (A) 01/21/2019 1620   UROBILINOGEN 1.0 06/28/2015 1552   NITRITE NEGATIVE 01/21/2019 1620   LEUKOCYTESUR LARGE (A) 01/21/2019 1620    Sepsis Labs: Lactic Acid, Venous    Component Value Date/Time   LATICACIDVEN 2.5 (HH) 01/21/2019 1718    MICROBIOLOGY: Recent Results (from the past 240 hour(s))  Culture, blood (routine x 2)     Status: None   Collection Time: 01/23/19 11:31 AM  Result Value Ref Range Status   Specimen Description BLOOD RIGHT THUMB  Final   Special Requests   Final    BOTTLES DRAWN AEROBIC ONLY Blood Culture adequate volume Performed at [facility], [place] [address], [place], [place] [address]    Culture NO GROWTH 5 DAYS  Final   Report Status 01/28/2019 FINAL  Final  Culture, blood (routine x 2)     Status: None   Collection Time: 01/23/19 11:37 AM  Result Value Ref Range Status   Specimen Description BLOOD LEFT HAND  Final   Special Requests   Final    BOTTLES DRAWN AEROBIC ONLY Blood Culture results may not be optimal due to an inadequate volume of blood received  in culture bottles  Performed at [facility], [place] [address] [place], [place] [address]    Culture NO GROWTH 5 DAYS  Final   Report Status 01/28/2019 FINAL  Final    RADIOLOGY STUDIES/RESULTS: Dg Chest Port 1 View  Result Date: 01/21/2019 CLINICAL DATA:  Altered mental status/nonresponsive EXAM: PORTABLE CHEST 1 VIEW COMPARISON:  Chest radiograph December 06, 2018 and chest CT December 07, 2018 FINDINGS: There is no appreciable edema or consolidation. Heart size and pulmonary vascularity are normal. No adenopathy. Patient is undergone previous mid lower thoracic kyphoplasty procedures. There is aortic atherosclerosis. Bones are osteoporotic. IMPRESSION: No edema or consolidation.  Stable cardiac silhouette. Aortic Atherosclerosis (ICD10-I70.0). Electronically Signed   By: Lowella Grip III M.D.   On: 01/21/2019 13:36   [place] Ekg Site Rite  Result Date: 01/26/2019 If Site Rite image not attached, placement could not be confirmed due to current cardiac rhythm.    LOS: 11 days    Signature  Lala Lund M.D on 02/01/2019 at 8:23 AM   -  To page go to [email or web address]

## 2019-02-01 NOTE — Plan of Care (Signed)
  Problem: Education: Goal: Knowledge of General Education information will improve Description Including pain rating scale, medication(s)/side effects and non-pharmacologic comfort measures Outcome: Progressing Note:  POC reviewed with pt.   

## 2019-02-01 NOTE — Progress Notes (Signed)
Made aware by IV team of pt's PICC line being removed. Asked pt if she removed PICC and she stated that she 'did it because she thought that her antibiotics were completed and didn't need it anymore'. Paged MD, awaiting orders; will continue to monitor.

## 2019-02-01 NOTE — Progress Notes (Signed)
CSW continuing to search for placement options. [facility] reviewing referral. CSW let them know that Medicaid application is pending.   Melissa Locus Alayiah Fontes LCSW [phone]

## 2019-02-02 LAB — GLUCOSE, CAPILLARY
GLUCOSE-CAPILLARY: 86 mg/dL (ref 70–99)
Glucose-Capillary: 134 mg/dL — ABNORMAL HIGH (ref 70–99)
Glucose-Capillary: 103 mg/dL — ABNORMAL HIGH (ref 70–99)
Glucose-Capillary: 99 mg/dL (ref 70–99)

## 2019-02-02 MED ORDER — DOXYCYCLINE HYCLATE 100 MG PO TABS
100.0000 mg | ORAL_TABLET | Freq: Two times a day (BID) | ORAL | Status: AC
  Administered 2019-02-02 – 2019-02-05 (×8): 100 mg via ORAL
  Filled 2019-02-02 (×9): qty 1

## 2019-02-02 NOTE — Progress Notes (Addendum)
After bladder scan of 301; patient voided 350 into purewick canister. Repeat bladder scan; 37. Will continue to monitor.   Hiram Comber, RN 02/02/2019 12:48 PM

## 2019-02-02 NOTE — Progress Notes (Signed)
PROGRESS NOTE        PATIENT DETAILS Name: [name] Age: 73 y.o. Sex: female Date of Birth: October 27, 1946 Admit Date: 01/21/2019 Admitting Physician Modena Jansky, MD [id number], [name], MD  Brief Narrative:  Patient is a 73 y.o. female with prior history of CAD status post PCI in 4656, chronic diastolic heart failure, DM-2, hypertension, dyslipidemia presenting with acute metabolic encephalopathy secondary to acute kidney injury and sepsis with hypotension from gram-positive bacteremia.  See below for further details  Subjective:  Patient in bed, appears comfortable, denies any headache, no fever, no chest pain or pressure, no shortness of breath , no abdominal pain. No focal weakness.   Assessment/Plan:  Sepsis with hypotension secondary to staph epidermidis bacteremia and ESBL Proteus UTI: She had 3 out of 4 bottles positive for staph epidermidis, urinary retention with ESBL Proteus UTI.  Seen by ID.  Currently on vancomycin and meropenem.  Sepsis pathophysiology has resolved, repeat blood cultures drawn on 01/23/2019 are so far negative.  Echo done 12/08/2018 unremarkable.  Per ID total 10 days of IV Vancomycin with stop date being 02/02/2019 total 10 days from the last negative cultures. However patient pulled her PICC out on 02/01/19 and did not anymore IV Meds, switched to PO Doxy in place of Vanc for 4 days on 02/02/19. Meropenem was stopped on 01/28/2019.   Acute renal failure: Due to sepsis along with home use of ACE inhibitor, NSAIDs, resolved after hydration, continue to hold offending medications.    Anion gap metabolic acidosis: To #1 and #2 above.  Resolved.  Acute metabolic encephalopathy: Due to sepsis and renal failure, much improved, nonfocal exam.  Continue supportive care.  Minimize narcotic and benzodiazepine use.  CAD s/p PCI 2014: Supportive care-no obvious anginal symptoms present.  Hypertension: Blood pressure controlled low-dose  metoprolol  Runs of narrow complex tachycardia/SVT: Stable with low-dose metoprolol.   Chronic diastolic heart failure: Volume status stable-monitor closely while on IV fluids.  Continue to hold diuretics for now.  Large multinodular goiter: Stable for outpatient follow-up-this is a chronic issue.  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  Dysphagia: SLP following advanced to dysphagia 3 diet.  Will monitor.  Mild antibiotic induced diarrhea.  No fever or leukocytosis.  Imodium.  Bladder outlet obstruction.  Continue Flomax, Trial of Foley removal on 01/28/2019 will monitor.  Insulin-dependent DM-2: Home dose Actos has been held, with just low-dose sliding scale 3 times a day with meals she is still becoming hypoglycemic early in the morning, will discontinue sliding scale and monitor on low carb diet only.  Continue checking CBGs for now.  Lab Results  Component Value Date   HGBA1C 7.2 (H) 01/30/2019   CBG (last 3)  Recent Labs    02/01/19 1655 02/01/19 2147 02/02/19 0818  GLUCAP 130* 121* 86      DVT Prophylaxis: Prophylactic Heparin   Code Status: DNR  Family Communication: None at bedside  Disposition Plan: Remain inpatient SNF on discharge-likely next week  Antimicrobial agents:  Anti-infectives (From admission, onward)   Start     Dose/Rate Route Frequency Ordered Stop   02/02/19 1000  doxycycline (VIBRA-TABS) tablet 100 mg     100 mg Oral Every 12 hours 02/02/19 0830 02/06/19 0959   02/01/19 1800  vancomycin (VANCOCIN) 1,500 mg in sodium chloride 0.9 % 500  mL IVPB  Status:  Discontinued     1,500 mg 250 mL/hr over 120 Minutes Intravenous Every 48 hours 01/30/19 1656 02/02/19 0832   01/25/19 1500  vancomycin (VANCOCIN) IVPB 1000 mg/200 mL premix  Status:  Discontinued     1,000 mg 200 mL/hr over 60 Minutes Intravenous Every 24 hours 01/25/19 0947 01/30/19 1656   01/25/19 1400  meropenem (MERREM) 1 g in  sodium chloride 0.9 % 100 mL IVPB     1 g 200 mL/hr over 30 Minutes Intravenous Every 8 hours 01/25/19 0947 01/28/19 1444   01/24/19 1500  vancomycin (VANCOCIN) 1,500 mg in sodium chloride 0.9 % 500 mL IVPB  Status:  Discontinued     1,500 mg 250 mL/hr over 120 Minutes Intravenous Every 24 hours 01/24/19 1403 01/25/19 0947   01/23/19 2330  meropenem (MERREM) 1 g in sodium chloride 0.9 % 100 mL IVPB  Status:  Discontinued     1 g 200 mL/hr over 30 Minutes Intravenous Every 12 hours 01/23/19 2321 01/25/19 0947   01/22/19 1200  ceFEPIme (MAXIPIME) 1 g in sodium chloride 0.9 % 100 mL IVPB  Status:  Discontinued     1 g 200 mL/hr over 30 Minutes Intravenous Every 24 hours 01/21/19 1446 01/22/19 0956   01/21/19 1446  vancomycin variable dose per unstable renal function (pharmacist dosing)  Status:  Discontinued      Does not apply See admin instructions 01/21/19 1446 01/22/19 0941   01/21/19 1415  ceFEPIme (MAXIPIME) 2 g in sodium chloride 0.9 % 100 mL IVPB     2 g 200 mL/hr over 30 Minutes Intravenous  Once 01/21/19 1403 01/21/19 1449   01/21/19 1415  vancomycin (VANCOCIN) IVPB 1000 mg/200 mL premix  Status:  Discontinued     1,000 mg 200 mL/hr over 60 Minutes Intravenous  Once 01/21/19 1403 01/21/19 1411   01/21/19 1415  vancomycin (VANCOCIN) 2,500 mg in sodium chloride 0.9 % 500 mL IVPB     2,500 mg 250 mL/hr over 120 Minutes Intravenous  Once 01/21/19 1411 01/21/19 1937      Procedures: None  CONSULTS:  None  Time spent: 25 minutes-Greater than 50% of this time was spent in counseling, explanation of diagnosis, planning of further management, and coordination of care.  MEDICATIONS: Scheduled Meds: . doxycycline  100 mg Oral Q12H  . heparin  5,000 Units Subcutaneous Q8H  . metoprolol tartrate  50 mg Oral BID  . potassium chloride  40 mEq Oral Daily  . sodium chloride flush  3 mL Intravenous Q12H  . tamsulosin  0.4 mg Oral Daily   Continuous Infusions:  PRN  Meds:.acetaminophen **OR** [DISCONTINUED] acetaminophen, albuterol, loperamide, metoprolol tartrate, ondansetron (ZOFRAN) IV, senna-docusate   PHYSICAL EXAM: Vital signs: Vitals:   02/01/19 0525 02/01/19 1453 02/01/19 2125 02/02/19 0517  BP: (!) 124/47 (!) 116/53 (!) 105/49 (!) 132/54  Pulse: 88 84 (!) 101 69  Resp: 18 18 18 20   Temp: 97.9 F (36.6 C) 97.9 F (36.6 C) 98.3 F (36.8 C) 97.6 F (36.4 C)  TempSrc: Oral Oral Oral Oral  SpO2: 92% 90% 99% 96%  Weight: 106.8 kg   108 kg  Height:       Filed Weights   01/31/19 0312 02/01/19 0525 02/02/19 0517  Weight: 106.6 kg 106.8 kg 108 kg   Body mass index is 35.68 kg/m.   Exam  Awake Alert, Oriented x 3, No new F.N deficits, Normal affect [place].AT,PERRAL Supple Neck,No JVD, No cervical lymphadenopathy appriciated.  Symmetrical Chest wall movement, Good air movement bilaterally, CTAB RRR,No Gallops, Rubs or new Murmurs, No Parasternal Heave +ve B.Sounds, Abd Soft, No tenderness, No organomegaly appriciated, No rebound - guarding or rigidity. No Cyanosis, Clubbing, chronic mild leg edema, No new Rash or bruise, bilat heal bandages   I have personally reviewed following labs and imaging studies  LABORATORY DATA: CBC: Recent Labs  Lab 01/27/19 0334 01/28/19 0405  WBC 7.4 7.6  HGB 10.7* 11.4*  HCT 33.6* 36.8  MCV 96.0 95.3  PLT 124* 127*    Basic Metabolic Panel: Recent Labs  Lab 01/26/19 1109 01/27/19 0334 01/28/19 0405 01/29/19 0424 01/31/19 0444  NA 146* 141 141  --   --   K 3.4* 3.8 4.4  --   --   CL 106 105 106  --   --   CO2 32 31 30  --   --   GLUCOSE 122* 93 82  --   --   BUN 31* 27* 24*  --   --   CREATININE 0.75 0.75 0.86  --  0.76  CALCIUM 8.4* 8.3* 8.4*  --   --   MG 2.1 1.8 1.6* 2.0  --     GFR: Estimated Creatinine Clearance: 80.2 mL/min (by C-G formula based on SCr of 0.76 mg/dL).  Liver Function Tests: No results for input(s): AST, ALT, ALKPHOS, BILITOT, PROT, ALBUMIN in the last 168  hours. No results for input(s): LIPASE, AMYLASE in the last 168 hours. No results for input(s): AMMONIA in the last 168 hours.  Coagulation Profile: No results for input(s): INR, PROTIME in the last 168 hours.  Cardiac Enzymes: No results for input(s): CKTOTAL, CKMB, CKMBINDEX, TROPONINI in the last 168 hours.  BNP (last 3 results) No results for input(s): PROBNP in the last 8760 hours.  HbA1C: Recent Labs    01/30/19 1456  HGBA1C 7.2*    CBG: Recent Labs  Lab 02/01/19 0829 02/01/19 1141 02/01/19 1655 02/01/19 2147 02/02/19 0818  GLUCAP 86 87 130* 121* 86    Lipid Profile: No results for input(s): CHOL, HDL, LDLCALC, TRIG, CHOLHDL, LDLDIRECT in the last 72 hours.  Thyroid Function Tests: No results for input(s): TSH, T4TOTAL, FREET4, T3FREE, THYROIDAB in the last 72 hours.  Anemia Panel: No results for input(s): VITAMINB12, FOLATE, FERRITIN, TIBC, IRON, RETICCTPCT in the last 72 hours.  Urine analysis:    Component Value Date/Time   COLORURINE YELLOW 01/21/2019 1620   APPEARANCEUR TURBID (A) 01/21/2019 1620   LABSPEC 1.020 01/21/2019 1620   PHURINE 8.0 01/21/2019 1620   GLUCOSEU NEGATIVE 01/21/2019 1620   HGBUR MODERATE (A) 01/21/2019 1620   BILIRUBINUR NEGATIVE 01/21/2019 1620   KETONESUR NEGATIVE 01/21/2019 1620   PROTEINUR 100 (A) 01/21/2019 1620   UROBILINOGEN 1.0 06/28/2015 1552   NITRITE NEGATIVE 01/21/2019 1620   LEUKOCYTESUR LARGE (A) 01/21/2019 1620    Sepsis Labs: Lactic Acid, Venous    Component Value Date/Time   LATICACIDVEN 2.5 (HH) 01/21/2019 1718    MICROBIOLOGY: Recent Results (from the past 240 hour(s))  Culture, blood (routine x 2)     Status: None   Collection Time: 01/23/19 11:31 AM  Result Value Ref Range Status   Specimen Description BLOOD RIGHT THUMB  Final   Special Requests   Final    BOTTLES DRAWN AEROBIC ONLY Blood Culture adequate volume Performed at [facility], [place] [address], [place], [place] [address]     Culture NO GROWTH 5 DAYS  Final   Report Status  01/28/2019 FINAL  Final  Culture, blood (routine x 2)     Status: None   Collection Time: 01/23/19 11:37 AM  Result Value Ref Range Status   Specimen Description BLOOD LEFT HAND  Final   Special Requests   Final    BOTTLES DRAWN AEROBIC ONLY Blood Culture results may not be optimal due to an inadequate volume of blood received in culture bottles Performed at [facility] [address], [place], [place] [address]    Culture NO GROWTH 5 DAYS  Final   Report Status 01/28/2019 FINAL  Final    RADIOLOGY STUDIES/RESULTS: Dg Chest Port 1 View  Result Date: 01/21/2019 CLINICAL DATA:  Altered mental status/nonresponsive EXAM: PORTABLE CHEST 1 VIEW COMPARISON:  Chest radiograph December 06, 2018 and chest CT December 07, 2018 FINDINGS: There is no appreciable edema or consolidation. Heart size and pulmonary vascularity are normal. No adenopathy. Patient is undergone previous mid lower thoracic kyphoplasty procedures. There is aortic atherosclerosis. Bones are osteoporotic. IMPRESSION: No edema or consolidation.  Stable cardiac silhouette. Aortic Atherosclerosis (ICD10-I70.0). Electronically Signed   By: Lowella Grip III M.D.   On: 01/21/2019 13:36   [place] Ekg Site Rite  Result Date: 01/26/2019 If Site Rite image not attached, placement could not be confirmed due to current cardiac rhythm.    LOS: 12 days    Signature  Lala Lund M.D on 02/02/2019 at 8:33 AM   -  To page go to [email or web address]

## 2019-02-02 NOTE — Progress Notes (Cosign Needed)
Bladder scan showed residual 301 cc. Per the chart, MD should be notified if residual is over 300 cc. RN assigned to patient notified.

## 2019-02-03 LAB — CBC
HCT: 29.4 % — ABNORMAL LOW (ref 36.0–46.0)
Hemoglobin: 9.4 g/dL — ABNORMAL LOW (ref 12.0–15.0)
MCH: 29.8 pg (ref 26.0–34.0)
MCHC: 32 g/dL (ref 30.0–36.0)
MCV: 93.3 fL (ref 80.0–100.0)
Platelets: 123 10*3/uL — ABNORMAL LOW (ref 150–400)
RBC: 3.15 MIL/uL — ABNORMAL LOW (ref 3.87–5.11)
RDW: 14.3 % (ref 11.5–15.5)
WBC: 5.6 10*3/uL (ref 4.0–10.5)
nRBC: 0 % (ref 0.0–0.2)

## 2019-02-03 LAB — COMPREHENSIVE METABOLIC PANEL
ALT: 28 U/L (ref 0–44)
AST: 29 U/L (ref 15–41)
Albumin: 1.6 g/dL — ABNORMAL LOW (ref 3.5–5.0)
Alkaline Phosphatase: 66 U/L (ref 38–126)
Anion gap: 4 — ABNORMAL LOW (ref 5–15)
BUN: 12 mg/dL (ref 8–23)
CO2: 24 mmol/L (ref 22–32)
Calcium: 8.5 mg/dL — ABNORMAL LOW (ref 8.9–10.3)
Chloride: 109 mmol/L (ref 98–111)
Creatinine, Ser: 0.71 mg/dL (ref 0.44–1.00)
Glucose, Bld: 120 mg/dL — ABNORMAL HIGH (ref 70–99)
Potassium: 4.8 mmol/L (ref 3.5–5.1)
Sodium: 137 mmol/L (ref 135–145)
TOTAL PROTEIN: 4.6 g/dL — AB (ref 6.5–8.1)
Total Bilirubin: 0.8 mg/dL (ref 0.3–1.2)

## 2019-02-03 LAB — GLUCOSE, CAPILLARY
Glucose-Capillary: 95 mg/dL (ref 70–99)
Glucose-Capillary: 78 mg/dL (ref 70–99)
Glucose-Capillary: 81 mg/dL (ref 70–99)

## 2019-02-03 LAB — MAGNESIUM: MAGNESIUM: 1.3 mg/dL — AB (ref 1.7–2.4)

## 2019-02-03 MED ORDER — WHITE PETROLATUM EX OINT
TOPICAL_OINTMENT | CUTANEOUS | Status: AC
  Administered 2019-02-03: 23:00:00
  Filled 2019-02-03: qty 28.35

## 2019-02-03 NOTE — Progress Notes (Addendum)
Notified Bodenhemir, NP that pt voided incontinent x2 prior to the during bladder scan. Pt's bladder scan result is 480cc. Pt states she can't pee anymore right now. No new orders given. Will continue to monitor pt. Ranelle Oyster, RN

## 2019-02-03 NOTE — Progress Notes (Signed)
PROGRESS NOTE        PATIENT DETAILS Name: [name] Age: 73 y.o. Sex: female Date of Birth: 1945-12-18 Admit Date: 01/21/2019 Admitting Physician Modena Jansky, MD [id number], [name], MD  Brief Narrative:  Patient is a 73 y.o. female with prior history of CAD status post PCI in 9528, chronic diastolic heart failure, DM-2, hypertension, dyslipidemia presenting with acute metabolic encephalopathy secondary to acute kidney injury and sepsis with hypotension from gram-positive bacteremia.  See below for further details.  Subjective:  Patient in bed, appears comfortable, denies any headache, no fever, no chest pain or pressure, no shortness of breath , no abdominal pain. No focal weakness.  Assessment/Plan:  Sepsis with hypotension secondary to staph epidermidis bacteremia and ESBL Proteus UTI: She had 3 out of 4 bottles positive for staph epidermidis, urinary retention with ESBL Proteus UTI.  Seen by ID.  Currently on vancomycin and meropenem.  Sepsis pathophysiology has resolved, repeat blood cultures drawn on 01/23/2019 are so far negative.  Echo done 12/08/2018 unremarkable.  Per ID total 10 days of IV Vancomycin with stop date being 02/02/2019 total 10 days from the last negative cultures. However patient pulled her PICC out on 02/01/19 and did not anymore IV Meds, switched to PO Doxy in place of Vanc for 4 days on 02/02/19. Meropenem was stopped on 01/28/2019.   Acute renal failure: Due to sepsis along with home use of ACE inhibitor, NSAIDs, resolved after hydration, continue to hold offending medications.    Anion gap metabolic acidosis: To #1 and #2 above.  Resolved.  Acute metabolic encephalopathy: Due to sepsis and renal failure, much improved, nonfocal exam.  Continue supportive care.  Minimize narcotic and benzodiazepine use.  CAD s/p PCI 2014: Supportive care-no obvious anginal symptoms present.  Hypertension: Blood pressure controlled low-dose  metoprolol  Runs of narrow complex tachycardia/SVT: Stable with low-dose metoprolol.   Chronic diastolic heart failure: Volume status stable-monitor closely while on IV fluids.  Continue to hold diuretics for now.  Large multinodular goiter: Stable for outpatient follow-up-this is a chronic issue.  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  Dysphagia: SLP following advanced to dysphagia 3 diet.  Will monitor.  Mild antibiotic induced diarrhea.  No fever or leukocytosis.  Imodium.  Bladder outlet obstruction.  Continue Flomax, Foley was removed 01/28/2019, has tolerated well.  Insulin-dependent DM-2: Home dose Actos has been held, with just low-dose sliding scale 3 times a day with meals she is still becoming hypoglycemic early in the morning, will discontinue sliding scale and monitor on low carb diet only.  Continue checking CBGs for now.  Lab Results  Component Value Date   HGBA1C 7.2 (H) 01/30/2019   CBG (last 3)  Recent Labs    02/02/19 1708 02/02/19 2103 02/03/19 0808  GLUCAP 103* 99 95      DVT Prophylaxis: Prophylactic Heparin   Code Status: DNR  Family Communication: None at bedside  Disposition Plan: Remain inpatient SNF on discharge-likely next week  Antimicrobial agents:  Anti-infectives (From admission, onward)   Start     Dose/Rate Route Frequency Ordered Stop   02/02/19 1000  doxycycline (VIBRA-TABS) tablet 100 mg     100 mg Oral Every 12 hours 02/02/19 0830 02/06/19 0959   02/01/19 1800  vancomycin (VANCOCIN) 1,500 mg in sodium chloride 0.9 % 500 mL IVPB  Status:  Discontinued     1,500 mg 250 mL/hr over 120 Minutes Intravenous Every 48 hours 01/30/19 1656 02/02/19 0832   01/25/19 1500  vancomycin (VANCOCIN) IVPB 1000 mg/200 mL premix  Status:  Discontinued     1,000 mg 200 mL/hr over 60 Minutes Intravenous Every 24 hours 01/25/19 0947 01/30/19 1656   01/25/19 1400  meropenem (MERREM) 1 g in  sodium chloride 0.9 % 100 mL IVPB     1 g 200 mL/hr over 30 Minutes Intravenous Every 8 hours 01/25/19 0947 01/28/19 1444   01/24/19 1500  vancomycin (VANCOCIN) 1,500 mg in sodium chloride 0.9 % 500 mL IVPB  Status:  Discontinued     1,500 mg 250 mL/hr over 120 Minutes Intravenous Every 24 hours 01/24/19 1403 01/25/19 0947   01/23/19 2330  meropenem (MERREM) 1 g in sodium chloride 0.9 % 100 mL IVPB  Status:  Discontinued     1 g 200 mL/hr over 30 Minutes Intravenous Every 12 hours 01/23/19 2321 01/25/19 0947   01/22/19 1200  ceFEPIme (MAXIPIME) 1 g in sodium chloride 0.9 % 100 mL IVPB  Status:  Discontinued     1 g 200 mL/hr over 30 Minutes Intravenous Every 24 hours 01/21/19 1446 01/22/19 0956   01/21/19 1446  vancomycin variable dose per unstable renal function (pharmacist dosing)  Status:  Discontinued      Does not apply See admin instructions 01/21/19 1446 01/22/19 0941   01/21/19 1415  ceFEPIme (MAXIPIME) 2 g in sodium chloride 0.9 % 100 mL IVPB     2 g 200 mL/hr over 30 Minutes Intravenous  Once 01/21/19 1403 01/21/19 1449   01/21/19 1415  vancomycin (VANCOCIN) IVPB 1000 mg/200 mL premix  Status:  Discontinued     1,000 mg 200 mL/hr over 60 Minutes Intravenous  Once 01/21/19 1403 01/21/19 1411   01/21/19 1415  vancomycin (VANCOCIN) 2,500 mg in sodium chloride 0.9 % 500 mL IVPB     2,500 mg 250 mL/hr over 120 Minutes Intravenous  Once 01/21/19 1411 01/21/19 1937      Procedures: None  CONSULTS:  None  Time spent: 25 minutes-Greater than 50% of this time was spent in counseling, explanation of diagnosis, planning of further management, and coordination of care.  MEDICATIONS: Scheduled Meds: . doxycycline  100 mg Oral Q12H  . heparin  5,000 Units Subcutaneous Q8H  . metoprolol tartrate  50 mg Oral BID  . potassium chloride  40 mEq Oral Daily  . sodium chloride flush  3 mL Intravenous Q12H  . tamsulosin  0.4 mg Oral Daily   Continuous Infusions:  PRN  Meds:.acetaminophen **OR** [DISCONTINUED] acetaminophen, albuterol, loperamide, metoprolol tartrate, ondansetron (ZOFRAN) IV, senna-docusate   PHYSICAL EXAM: Vital signs: Vitals:   02/02/19 1006 02/02/19 1431 02/02/19 2105 02/03/19 0548  BP: 124/84 (!) 142/43 121/71 126/79  Pulse: 86 77 91 85  Resp:  17 18 18   Temp:  97.8 F (36.6 C) 98.1 F (36.7 C) 98 F (36.7 C)  TempSrc:  Oral Oral Oral  SpO2:  100% 95% 98%  Weight:    109.1 kg  Height:       Filed Weights   02/01/19 0525 02/02/19 0517 02/03/19 0548  Weight: 106.8 kg 108 kg 109.1 kg   Body mass index is 36.04 kg/m.   Exam  Awake Alert, Oriented X 3, No new F.N deficits, Normal affect [place].AT,PERRAL Supple Neck,No JVD, No cervical lymphadenopathy appriciated.  Symmetrical Chest wall movement, Good air movement bilaterally, CTAB RRR,No Gallops,  Rubs or new Murmurs, No Parasternal Heave +ve B.Sounds, Abd Soft, No tenderness, No organomegaly appriciated, No rebound - guarding or rigidity. No Cyanosis, Clubbing or edema, bilat heal bandages   I have personally reviewed following labs and imaging studies  LABORATORY DATA: CBC: Recent Labs  Lab 01/28/19 0405  WBC 7.6  HGB 11.4*  HCT 36.8  MCV 95.3  PLT 127*    Basic Metabolic Panel: Recent Labs  Lab 01/28/19 0405 01/29/19 0424 01/31/19 0444  NA 141  --   --   K 4.4  --   --   CL 106  --   --   CO2 30  --   --   GLUCOSE 82  --   --   BUN 24*  --   --   CREATININE 0.86  --  0.76  CALCIUM 8.4*  --   --   MG 1.6* 2.0  --     GFR: Estimated Creatinine Clearance: 80.5 mL/min (by C-G formula based on SCr of 0.76 mg/dL).  Liver Function Tests: No results for input(s): AST, ALT, ALKPHOS, BILITOT, PROT, ALBUMIN in the last 168 hours. No results for input(s): LIPASE, AMYLASE in the last 168 hours. No results for input(s): AMMONIA in the last 168 hours.  Coagulation Profile: No results for input(s): INR, PROTIME in the last 168 hours.  Cardiac  Enzymes: No results for input(s): CKTOTAL, CKMB, CKMBINDEX, TROPONINI in the last 168 hours.  BNP (last 3 results) No results for input(s): PROBNP in the last 8760 hours.  HbA1C: No results for input(s): HGBA1C in the last 72 hours.  CBG: Recent Labs  Lab 02/02/19 0818 02/02/19 1145 02/02/19 1708 02/02/19 2103 02/03/19 0808  GLUCAP 86 134* 103* 99 95    Lipid Profile: No results for input(s): CHOL, HDL, LDLCALC, TRIG, CHOLHDL, LDLDIRECT in the last 72 hours.  Thyroid Function Tests: No results for input(s): TSH, T4TOTAL, FREET4, T3FREE, THYROIDAB in the last 72 hours.  Anemia Panel: No results for input(s): VITAMINB12, FOLATE, FERRITIN, TIBC, IRON, RETICCTPCT in the last 72 hours.  Urine analysis:    Component Value Date/Time   COLORURINE YELLOW 01/21/2019 1620   APPEARANCEUR TURBID (A) 01/21/2019 1620   LABSPEC 1.020 01/21/2019 1620   PHURINE 8.0 01/21/2019 1620   GLUCOSEU NEGATIVE 01/21/2019 1620   HGBUR MODERATE (A) 01/21/2019 1620   BILIRUBINUR NEGATIVE 01/21/2019 1620   KETONESUR NEGATIVE 01/21/2019 1620   PROTEINUR 100 (A) 01/21/2019 1620   UROBILINOGEN 1.0 06/28/2015 1552   NITRITE NEGATIVE 01/21/2019 1620   LEUKOCYTESUR LARGE (A) 01/21/2019 1620    Sepsis Labs: Lactic Acid, Venous    Component Value Date/Time   LATICACIDVEN 2.5 (HH) 01/21/2019 1718    MICROBIOLOGY: No results found for this or any previous visit (from the past 240 hour(s)).  RADIOLOGY STUDIES/RESULTS: Dg Chest Port 1 View  Result Date: 01/21/2019 CLINICAL DATA:  Altered mental status/nonresponsive EXAM: PORTABLE CHEST 1 VIEW COMPARISON:  Chest radiograph December 06, 2018 and chest CT December 07, 2018 FINDINGS: There is no appreciable edema or consolidation. Heart size and pulmonary vascularity are normal. No adenopathy. Patient is undergone previous mid lower thoracic kyphoplasty procedures. There is aortic atherosclerosis. Bones are osteoporotic. IMPRESSION: No edema or  consolidation.  Stable cardiac silhouette. Aortic Atherosclerosis (ICD10-I70.0). Electronically Signed   By: Lowella Grip III M.D.   On: 01/21/2019 13:36   [place] Ekg Site Rite  Result Date: 01/26/2019 If Site Rite image not attached, placement could not be confirmed due to current cardiac  rhythm.    LOS: 13 days    Signature  Lala Lund M.D on 02/03/2019 at 8:52 AM   -  To page go to [email or web address]

## 2019-02-04 LAB — GLUCOSE, CAPILLARY
GLUCOSE-CAPILLARY: 95 mg/dL (ref 70–99)
Glucose-Capillary: 69 mg/dL — ABNORMAL LOW (ref 70–99)
Glucose-Capillary: 85 mg/dL (ref 70–99)
Glucose-Capillary: 102 mg/dL — ABNORMAL HIGH (ref 70–99)
Glucose-Capillary: 115 mg/dL — ABNORMAL HIGH (ref 70–99)

## 2019-02-04 MED ORDER — MAGNESIUM SULFATE 2 GM/50ML IV SOLN
2.0000 g | Freq: Once | INTRAVENOUS | Status: AC
  Administered 2019-02-04: 2 g via INTRAVENOUS
  Filled 2019-02-04: qty 50

## 2019-02-04 NOTE — Progress Notes (Signed)
PROGRESS NOTE        PATIENT DETAILS Name: [name] Age: 73 y.o. Sex: female Date of Birth: 1946-02-12 Admit Date: 01/21/2019 Admitting Physician Modena Jansky, MD [id number], [name], MD  Brief Narrative:  Patient is a 73 y.o. female with prior history of CAD status post PCI in 6967, chronic diastolic heart failure, DM-2, hypertension, dyslipidemia presenting with acute metabolic encephalopathy secondary to acute kidney injury and sepsis with hypotension from gram-positive bacteremia.  See below for further details.  Subjective:  Patient in bed, appears comfortable, denies any headache, no fever, no chest pain or pressure, no shortness of breath , no abdominal pain. No focal weakness.  Assessment/Plan:  Sepsis with hypotension secondary to staph epidermidis bacteremia and ESBL Proteus UTI: She had 3 out of 4 bottles positive for staph epidermidis, urinary retention with ESBL Proteus UTI.  Seen by ID.  Currently on vancomycin and meropenem.  Sepsis pathophysiology has resolved, repeat blood cultures drawn on 01/23/2019 are so far negative.  Echo done 12/08/2018 unremarkable.  Per ID total 10 days of IV Vancomycin with stop date being 02/02/2019 total 10 days from the last negative cultures. However patient pulled her PICC out on 02/01/19 and did not anymore IV Meds, switched to PO Doxy in place of Vanc for 4 days on 02/02/19. Meropenem was stopped on 01/28/2019.   Acute renal failure: Due to sepsis along with home use of ACE inhibitor, NSAIDs, resolved after hydration, continue to hold offending medications.    Anion gap metabolic acidosis: To #1 and #2 above.  Resolved.  Acute metabolic encephalopathy: Due to sepsis and renal failure, much improved, nonfocal exam.  Continue supportive care.  Minimize narcotic and benzodiazepine use.  CAD s/p PCI 2014: Supportive care-no obvious anginal symptoms present.  Hypertension: Blood pressure controlled low-dose  metoprolol  Runs of narrow complex tachycardia/SVT: Stable with low-dose metoprolol.   Chronic diastolic heart failure: Volume status stable-monitor closely while on IV fluids.  Continue to hold diuretics for now.  Large multinodular goiter: Stable for outpatient follow-up-this is a chronic issue.  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  Dysphagia: SLP following advanced to dysphagia 3 diet.  Will monitor.  Mild antibiotic induced diarrhea.  No fever or leukocytosis.  Imodium.  Bladder outlet obstruction.  Continue Flomax, Foley was removed 01/28/2019, has tolerated well.  Insulin-dependent DM-2: Home dose Actos has been held, with just low-dose sliding scale 3 times a day with meals she is still becoming hypoglycemic early in the morning, will discontinue sliding scale and monitor on low carb diet only.  Continue checking CBGs for now.  Lab Results  Component Value Date   HGBA1C 7.2 (H) 01/30/2019   CBG (last 3)  Recent Labs    02/03/19 2106 02/04/19 0749 02/04/19 1107  GLUCAP 81 69* 85      DVT Prophylaxis: Prophylactic Heparin   Code Status: DNR  Family Communication: None at bedside  Disposition Plan: Remain inpatient SNF on discharge-likely next week  Antimicrobial agents:  Anti-infectives (From admission, onward)   Start     Dose/Rate Route Frequency Ordered Stop   02/02/19 1000  doxycycline (VIBRA-TABS) tablet 100 mg     100 mg Oral Every 12 hours 02/02/19 0830 02/06/19 0959   02/01/19 1800  vancomycin (VANCOCIN) 1,500 mg in sodium chloride 0.9 % 500 mL IVPB  Status:  Discontinued     1,500 mg 250 mL/hr over 120 Minutes Intravenous Every 48 hours 01/30/19 1656 02/02/19 0832   01/25/19 1500  vancomycin (VANCOCIN) IVPB 1000 mg/200 mL premix  Status:  Discontinued     1,000 mg 200 mL/hr over 60 Minutes Intravenous Every 24 hours 01/25/19 0947 01/30/19 1656   01/25/19 1400  meropenem (MERREM) 1 g in sodium  chloride 0.9 % 100 mL IVPB     1 g 200 mL/hr over 30 Minutes Intravenous Every 8 hours 01/25/19 0947 01/28/19 1444   01/24/19 1500  vancomycin (VANCOCIN) 1,500 mg in sodium chloride 0.9 % 500 mL IVPB  Status:  Discontinued     1,500 mg 250 mL/hr over 120 Minutes Intravenous Every 24 hours 01/24/19 1403 01/25/19 0947   01/23/19 2330  meropenem (MERREM) 1 g in sodium chloride 0.9 % 100 mL IVPB  Status:  Discontinued     1 g 200 mL/hr over 30 Minutes Intravenous Every 12 hours 01/23/19 2321 01/25/19 0947   01/22/19 1200  ceFEPIme (MAXIPIME) 1 g in sodium chloride 0.9 % 100 mL IVPB  Status:  Discontinued     1 g 200 mL/hr over 30 Minutes Intravenous Every 24 hours 01/21/19 1446 01/22/19 0956   01/21/19 1446  vancomycin variable dose per unstable renal function (pharmacist dosing)  Status:  Discontinued      Does not apply See admin instructions 01/21/19 1446 01/22/19 0941   01/21/19 1415  ceFEPIme (MAXIPIME) 2 g in sodium chloride 0.9 % 100 mL IVPB     2 g 200 mL/hr over 30 Minutes Intravenous  Once 01/21/19 1403 01/21/19 1449   01/21/19 1415  vancomycin (VANCOCIN) IVPB 1000 mg/200 mL premix  Status:  Discontinued     1,000 mg 200 mL/hr over 60 Minutes Intravenous  Once 01/21/19 1403 01/21/19 1411   01/21/19 1415  vancomycin (VANCOCIN) 2,500 mg in sodium chloride 0.9 % 500 mL IVPB     2,500 mg 250 mL/hr over 120 Minutes Intravenous  Once 01/21/19 1411 01/21/19 1937      Procedures: None  CONSULTS:  None  Time spent: 25 minutes-Greater than 50% of this time was spent in counseling, explanation of diagnosis, planning of further management, and coordination of care.  MEDICATIONS: Scheduled Meds: . doxycycline  100 mg Oral Q12H  . heparin  5,000 Units Subcutaneous Q8H  . metoprolol tartrate  50 mg Oral BID  . potassium chloride  40 mEq Oral Daily  . sodium chloride flush  3 mL Intravenous Q12H  . tamsulosin  0.4 mg Oral Daily   Continuous Infusions:  PRN Meds:.acetaminophen  **OR** [DISCONTINUED] acetaminophen, albuterol, loperamide, metoprolol tartrate, ondansetron (ZOFRAN) IV, senna-docusate   PHYSICAL EXAM: Vital signs: Vitals:   02/03/19 2153 02/04/19 0419 02/04/19 0422 02/04/19 0935  BP: 106/84  130/68 104/75  Pulse: 78  91 98  Resp: 20     Temp: 98.1 F (36.7 C)  98 F (36.7 C) 98.3 F (36.8 C)  TempSrc: Oral  Oral Oral  SpO2: 97%  93% 97%  Weight:  106.8 kg    Height:       Filed Weights   02/02/19 0517 02/03/19 0548 02/04/19 0419  Weight: 108 kg 109.1 kg 106.8 kg   Body mass index is 35.28 kg/m.   Exam  Awake Alert, Oriented X 3, No new F.N deficits, Normal affect [place].AT,PERRAL Supple Neck,No JVD, No cervical lymphadenopathy appriciated.  Symmetrical Chest wall movement, Good air movement bilaterally, CTAB RRR,No Gallops, Rubs  or new Murmurs, No Parasternal Heave +ve B.Sounds, Abd Soft, No tenderness, No organomegaly appriciated, No rebound - guarding or rigidity. No Cyanosis, Clubbing or edema, bilat heal bandages   I have personally reviewed following labs and imaging studies  LABORATORY DATA: CBC: Recent Labs  Lab 02/03/19 1506  WBC 5.6  HGB 9.4*  HCT 29.4*  MCV 93.3  PLT 123*    Basic Metabolic Panel: Recent Labs  Lab 01/29/19 0424 01/31/19 0444 02/03/19 1506  NA  --   --  137  K  --   --  4.8  CL  --   --  109  CO2  --   --  24  GLUCOSE  --   --  120*  BUN  --   --  12  CREATININE  --  0.76 0.71  CALCIUM  --   --  8.5*  MG 2.0  --  1.3*    GFR: Estimated Creatinine Clearance: 79.7 mL/min (by C-G formula based on SCr of 0.71 mg/dL).  Liver Function Tests: Recent Labs  Lab 02/03/19 1506  AST 29  ALT 28  ALKPHOS 66  BILITOT 0.8  PROT 4.6*  ALBUMIN 1.6*   No results for input(s): LIPASE, AMYLASE in the last 168 hours. No results for input(s): AMMONIA in the last 168 hours.  Coagulation Profile: No results for input(s): INR, PROTIME in the last 168 hours.  Cardiac Enzymes: No results for  input(s): CKTOTAL, CKMB, CKMBINDEX, TROPONINI in the last 168 hours.  BNP (last 3 results) No results for input(s): PROBNP in the last 8760 hours.  HbA1C: No results for input(s): HGBA1C in the last 72 hours.  CBG: Recent Labs  Lab 02/03/19 0808 02/03/19 1208 02/03/19 2106 02/04/19 0749 02/04/19 1107  GLUCAP 95 78 81 69* 85    Lipid Profile: No results for input(s): CHOL, HDL, LDLCALC, TRIG, CHOLHDL, LDLDIRECT in the last 72 hours.  Thyroid Function Tests: No results for input(s): TSH, T4TOTAL, FREET4, T3FREE, THYROIDAB in the last 72 hours.  Anemia Panel: No results for input(s): VITAMINB12, FOLATE, FERRITIN, TIBC, IRON, RETICCTPCT in the last 72 hours.  Urine analysis:    Component Value Date/Time   COLORURINE YELLOW 01/21/2019 1620   APPEARANCEUR TURBID (A) 01/21/2019 1620   LABSPEC 1.020 01/21/2019 1620   PHURINE 8.0 01/21/2019 1620   GLUCOSEU NEGATIVE 01/21/2019 1620   HGBUR MODERATE (A) 01/21/2019 1620   BILIRUBINUR NEGATIVE 01/21/2019 1620   KETONESUR NEGATIVE 01/21/2019 1620   PROTEINUR 100 (A) 01/21/2019 1620   UROBILINOGEN 1.0 06/28/2015 1552   NITRITE NEGATIVE 01/21/2019 1620   LEUKOCYTESUR LARGE (A) 01/21/2019 1620    Sepsis Labs: Lactic Acid, Venous    Component Value Date/Time   LATICACIDVEN 2.5 (HH) 01/21/2019 1718    MICROBIOLOGY: No results found for this or any previous visit (from the past 240 hour(s)).  RADIOLOGY STUDIES/RESULTS: Dg Chest Port 1 View  Result Date: 01/21/2019 CLINICAL DATA:  Altered mental status/nonresponsive EXAM: PORTABLE CHEST 1 VIEW COMPARISON:  Chest radiograph December 06, 2018 and chest CT December 07, 2018 FINDINGS: There is no appreciable edema or consolidation. Heart size and pulmonary vascularity are normal. No adenopathy. Patient is undergone previous mid lower thoracic kyphoplasty procedures. There is aortic atherosclerosis. Bones are osteoporotic. IMPRESSION: No edema or consolidation.  Stable cardiac  silhouette. Aortic Atherosclerosis (ICD10-I70.0). Electronically Signed   By: Lowella Grip III M.D.   On: 01/21/2019 13:36   [place] Ekg Site Rite  Result Date: 01/26/2019 If [employer] not  attached, placement could not be confirmed due to current cardiac rhythm.    LOS: 14 days    Signature  Lala Lund M.D on 02/04/2019 at 11:32 AM   -  To page go to [email or web address]

## 2019-02-04 NOTE — Progress Notes (Signed)
Physical Therapy Treatment Patient Details Name: [name] MRN: [id number] DOB: [birth year] Today's Date: 02/04/2019    History of Present Illness Pt is a 73 y.o. female admitted 01/21/19 with confusion, incontinence and chest pain. Worked up for sepsis with hypotension and metabolic encephalopathy secondary to staph epidermidis bacteremia and ESBL Proteus UTI. Suspected mild dementia. PMH includes CAD, CHF, DM2, HTN, HLD; recent hospitalizaiton s1/9-1/14/20.    PT Comments    Pt presented in bed having just finished BM. Pt agreed to attempt to walk with RW per Dr request. Pt remains total A +2 for all mobility and transfers. Pt unable to hold head up when in seated at EOB and attempted to fall backwards to attempt getting back in bed. Pt also stated she was dizzy at EOB. Decision to walk was terminated as pt could not sit upright, or attempt to stand. Pt could not initiate movement to assist with Stedy Lift trial. Pt limited d/t cognitive factors and physical atrophy from bed bound status and refusal to progress therapy in previous weeks. Pt transferred via maximove to recliner chair and left with call bell/phone, all other needs within reach, and chair alarm set. Pt educated on importance of engaging with therapy to improve physical state and get back to her PLOF. Pt plan to d/c to SNF is appropriate at this time. Plan to progress pt bed mobility and transfer training as pt tolerates and is agreeable.  Follow Up Recommendations  SNF;Supervision/Assistance - 24 hour     Equipment Recommendations  None recommended by PT    Recommendations for Other Services       Precautions / Restrictions Precautions Precautions: Fall Restrictions Weight Bearing Restrictions: No    Mobility  Bed Mobility Overal bed mobility: Needs Assistance Bed Mobility: Rolling;Sidelying to Sit Rolling: Total assist;+2 for physical assistance Sidelying to sit: Total assist;+2 for physical assistance Supine to  sit: Total assist;+2 for physical assistance     General bed mobility comments: Pt requires total A + 2 for all mobility and requires max cues to use BUE to assist. Pt requires total A when attempting to negotiate legs to EOB.  Transfers Overall transfer level: Needs assistance   Transfers: Sit to/from Stand Sit to Stand: Total assist;+2 physical assistance;+2 safety/equipment         General transfer comment: Attempted sit to/from stand with [facility] and pt unable to initiate movement to assist with stand. Pt extremely fatigued and limited d/t fear of falling and overall cognitive deficits. Pt transferred via maximove to recliner and sat up at a level to encourage trunk muscle activation.   Ambulation/Gait             General Gait Details: unable   Stairs             Wheelchair Mobility    Modified Rankin (Stroke Patients Only)       Balance Overall balance assessment: Needs assistance Sitting-balance support: Bilateral upper extremity supported Sitting balance-Leahy Scale: Zero Sitting balance - Comments: Pt unable to sit EOB without total A +2. Pt attempted multiple times during sitting to push posterior hard to lay back down in bed.  Postural control: Posterior lean;Other (comment)(unable to lift head up)     Standing balance comment: unable to assess during treatment                            Cognition Arousal/Alertness: Awake/alert Behavior During Therapy: Anxious;Restless Overall Cognitive Status: No  family/caregiver present to determine baseline cognitive functioning Area of Impairment: Orientation;Attention;Memory;Following commands;Safety/judgement;Awareness;Problem solving                 Orientation Level: Disoriented to;Time;Situation;Place Current Attention Level: Selective Memory: Decreased short-term memory Following Commands: Follows one step commands inconsistently Safety/Judgement: Decreased awareness of safety;Decreased  awareness of deficits Awareness: Intellectual Problem Solving: Decreased initiation;Requires verbal cues;Requires tactile cues;Slow processing;Difficulty sequencing General Comments: Pt agreeable and pleasant. Very internally distracted throughout session and tangential with speech; required multiple redirects to task and current conversation. "Will you go to the back or front door to see if my friend dropped that package off for me"      Exercises      General Comments        Pertinent Vitals/Pain Faces Pain Scale: Hurts little more Pain Location: unspecified, grimacing with bed mobility Pain Descriptors / Indicators: Grimacing    Home Living                      Prior Function            PT Goals (current goals can now be found in the care plan section) Acute Rehab PT Goals Patient Stated Goal: None stated Potential to Achieve Goals: Poor    Frequency    Min 2X/week      PT Plan Current plan remains appropriate    Co-evaluation              AM-PAC PT "6 Clicks" Mobility   Outcome Measure  Help needed turning from your back to your side while in a flat bed without using bedrails?: Total Help needed moving from lying on your back to sitting on the side of a flat bed without using bedrails?: Total Help needed moving to and from a bed to a chair (including a wheelchair)?: Total Help needed standing up from a chair using your arms (e.g., wheelchair or bedside chair)?: Total Help needed to walk in hospital room?: Total Help needed climbing 3-5 steps with a railing? : Total 6 Click Score: 6    End of Session Equipment Utilized During Treatment: Gait belt(maxi move and pad) Activity Tolerance: Other (comment);Patient limited by fatigue;Patient limited by pain;Patient limited by lethargy Patient left: in chair;with chair alarm set;with call bell/phone within reach(pt attempting to eat breakfast) Nurse Communication: Mobility status PT Visit Diagnosis:  Muscle weakness (generalized) (M62.81) Pain - part of body: Leg;Arm     Time: 1126-1206 PT Time Calculation (min) (ACUTE ONLY): 40 min  Charges:  $Therapeutic Activity: 38-52 mins                     Maryelizabeth Kaufmann, SPTA   Maryelizabeth Kaufmann 02/04/2019, 12:34 PM

## 2019-02-04 NOTE — Progress Notes (Addendum)
1234: Physician informed of urinary retention. Orders obtained.   1237: Unable to straight cath. Patient eating. Up to chair. Will try later.  1500: Patient back to bed.  1510: Straight cath performed with Inocencio Homes. Patient tolerated well. 575 ml concentrated, cloudy tea colored urine with sediment removed.

## 2019-02-05 LAB — GLUCOSE, CAPILLARY
GLUCOSE-CAPILLARY: 68 mg/dL — AB (ref 70–99)
Glucose-Capillary: 106 mg/dL — ABNORMAL HIGH (ref 70–99)
Glucose-Capillary: 104 mg/dL — ABNORMAL HIGH (ref 70–99)
Glucose-Capillary: 111 mg/dL — ABNORMAL HIGH (ref 70–99)

## 2019-02-05 LAB — MAGNESIUM: Magnesium: 1.5 mg/dL — ABNORMAL LOW (ref 1.7–2.4)

## 2019-02-05 MED ORDER — GLUCERNA SHAKE PO LIQD
237.0000 mL | ORAL | Status: DC
  Administered 2019-02-05 – 2019-02-08 (×3): 237 mL via ORAL

## 2019-02-05 MED ORDER — MAGNESIUM SULFATE 2 GM/50ML IV SOLN
2.0000 g | Freq: Once | INTRAVENOUS | Status: AC
  Administered 2019-02-05: 2 g via INTRAVENOUS
  Filled 2019-02-05: qty 50

## 2019-02-05 NOTE — Progress Notes (Signed)
   02/05/19 0310  Urine Characteristics  Urine Color Yellow/straw  Urine Appearance Cloudy  Intermittent/Straight Cath (mL) 400 mL  Hygiene Peri care   Patient stated she could only urinate minimal before bladder scan of 392. Straight cath done by NT with RN supervision. Patient tolerated well.

## 2019-02-05 NOTE — Progress Notes (Signed)
[name] is willing to accept patient with Medicaid pending. They will start insurance process to see if patient can get any therapies. Patient's friend, Freada Bergeron updated.   Percell Locus Keairra Bardon LCSW [phone]

## 2019-02-05 NOTE — Progress Notes (Signed)
Occupational Therapy Treatment Patient Details Name: SAVANNAHA STONEROCK MRN: [id number] DOB: [birth year] Today's Date: 02/05/2019    History of present illness Pt is a 73 y.o. female admitted 01/21/19 with confusion, incontinence and chest pain. Worked up for sepsis with hypotension and metabolic encephalopathy secondary to staph epidermidis bacteremia and ESBL Proteus UTI. Suspected mild dementia. PMH includes CAD, CHF, DM2, HTN, HLD; recent hospitalizaiton s1/9-1/14/20.   OT comments  Pt supine in bed finishing lunch upon entering the room. Pt agreeable to OT intervention initially. Pt required max cuing for redirection throughout session secondary to increased tangential speech. Pt performing grooming items with set up A from bed level. Pt declined OOB and EOB activities this session. Pt demonstrated x15 reps B ankle pumps and aerobic exercise with B UEs against gravity. Pt declined all other activities at this time and remained in bed at end of session.   Follow Up Recommendations  SNF    Equipment Recommendations  Other (comment)(defer to next venue of care)    Recommendations for Other Services      Precautions / Restrictions Precautions Precautions: Fall Restrictions Weight Bearing Restrictions: No       Mobility  Transfers    General transfer comment: pt declined        ADL either performed or assessed with clinical judgement                  Cognition Arousal/Alertness: Awake/alert Behavior During Therapy: Anxious Overall Cognitive Status: No family/caregiver present to determine baseline cognitive functioning Area of Impairment: Orientation;Attention;Memory;Following commands;Safety/judgement;Awareness;Problem solving          Orientation Level: Disoriented to;Situation Current Attention Level: Selective Memory: Decreased short-term memory Following Commands: Follows one step commands inconsistently Safety/Judgement: Decreased awareness of  safety;Decreased awareness of deficits Awareness: Intellectual Problem Solving: Decreased initiation;Requires verbal cues;Requires tactile cues;Slow processing;Difficulty sequencing General Comments: tangential speech throughout and needing max cuing to attend to task                   Pertinent Vitals/ Pain       Pain Assessment: Faces Faces Pain Scale: No hurt         Frequency  Min 2X/week        Progress Toward Goals  OT Goals(current goals can now be found in the care plan section)  Progress towards OT goals: Progressing toward goals  Acute Rehab OT Goals Patient Stated Goal: None stated  Plan Discharge plan remains appropriate       AM-PAC OT "6 Clicks" Daily Activity     Outcome Measure   Help from another person eating meals?: Total Help from another person taking care of personal grooming?: Total Help from another person toileting, which includes using toliet, bedpan, or urinal?: Total Help from another person bathing (including washing, rinsing, drying)?: Total Help from another person to put on and taking off regular upper body clothing?: Total Help from another person to put on and taking off regular lower body clothing?: Total 6 Click Score: 6    End of Session    OT Visit Diagnosis: Unsteadiness on feet (R26.81);Muscle weakness (generalized) (M62.81);Pain;Other symptoms and signs involving cognitive function;Cognitive communication deficit (R41.841)   Activity Tolerance Patient limited by fatigue   Patient Left in bed;with call bell/phone within reach;with bed alarm set   Nurse Communication Mobility status        Time: 1425-1443 OT Time Calculation (min): 18 min  Charges: OT General Charges $OT Visit: 1 Visit OT Treatments $Therapeutic  Activity: 8-22 mins  Gypsy Decant, MS, OTR/L 02/05/2019, 2:49 PM

## 2019-02-05 NOTE — Progress Notes (Addendum)
1324: Bladder scan performed, 496 residual.  1404: Straight cath performed with Sarah RN. Patient tolerated well. 679ml yellow urine with sediment removed. 55fr cath in tact upon removal.  1833: Bladder scan performed. 180ml residual.

## 2019-02-05 NOTE — Progress Notes (Signed)
Nutrition Follow-up  DOCUMENTATION CODES:   Obesity unspecified  INTERVENTION:   - Continue snacks  - Try Glucerna Shake po q 24 hours, each supplement provides 220 kcal and 10 grams of protein (chocolate flavor)  NUTRITION DIAGNOSIS:   Inadequate oral intake related to nausea as evidenced by per patient/family report, meal completion < 25%.  Progressing  GOAL:   Patient will meet greater than or equal to 90% of their needs  Progressing  MONITOR:   PO intake, Supplement acceptance, Labs, Weight trends  REASON FOR ASSESSMENT:   Low Braden    ASSESSMENT:   73 year old female who presented to the ED on 2/24 with hypotension. PMH significant for MI/CAD s/o PCI 2014, CHF, T2DM dependent on insulin, HTN, HLD. Pt recently hospitalized 12/06/18-12/11/18 with acute metabolic encephalopathy d/t UTI. Pt admitted with hypovolemic shock, severe dehydration with hyponatremia, sepsis, and acute renal failure.  Overall, weight up 12 lbs since first measured weight. Suspect this is related to positive fluid balance.  Discussed pt with RN who reports just setting pt up for lunch meal. RN reports pt is receiving and eating bagged snacks.  Pt very pleasant and talkative at time of RD visit. Pt eating tuna salad sandwich and tomato soup and reports "loving" both food items. Pt states that her appetite is good and that she is eating well.  Pt states that she tried the chocolate Glucerna and really liked it. Pt reports she is going to rehab soon but is amenable to RD ordering Glucerna q 24 hours until d/c.  Meal Completion: 75-100%  Medications reviewed and include: K-dur 40 mEq daily  Labs reviewed: magnesium 1.5 (L), hemoglobin 9.4 (L) CBG's: 106, 68, 95, 115, 102 x 24 hours  UOP: 1125 ml x 24 hours I/O's: +2.7 L since admit  Diet Order:   Diet Order            Diet Carb Modified Fluid consistency: Thin; Room service appropriate? Yes with Assist  Diet effective now               EDUCATION NEEDS:   Education needs have been addressed  Skin:  Skin Assessment: Reviewed RN Assessment  Last BM:  3/10 small type 5  Height:   Ht Readings from Last 1 Encounters:  01/21/19 5' 8.5" (1.74 m)    Weight:   Wt Readings from Last 1 Encounters:  02/05/19 106 kg    Ideal Body Weight:  64.8 kg  BMI:  Body mass index is 35.02 kg/m.  Estimated Nutritional Needs:   Kcal:  1600-1800  Protein:  90-105 grams  Fluid:  1.6-1.8 L    Gaynell Face, MS, RD, LDN Inpatient Clinical Dietitian Pager: [phone] Weekend/After Hours: [phone]

## 2019-02-05 NOTE — Progress Notes (Signed)
PROGRESS NOTE        PATIENT DETAILS Name: [name] Age: 73 y.o. Sex: female Date of Birth: [birth year] Admit Date: 01/21/2019 Admitting Physician Modena Jansky, MD PIR:JJOACZ, [name], MD  Brief Narrative:  Patient is a 73 y.o. female with prior history of CAD status post PCI in 6606, chronic diastolic heart failure, DM-2, hypertension, dyslipidemia presenting with acute metabolic encephalopathy secondary to acute kidney injury and sepsis with hypotension from gram-positive bacteremia.  See below for further details.  Subjective:  Patient in bed, appears comfortable, denies any headache, no fever, no chest pain or pressure, no shortness of breath , no abdominal pain. No focal weakness.   Assessment/Plan:  Sepsis with hypotension secondary to staph epidermidis bacteremia and ESBL Proteus UTI: She had 3 out of 4 bottles positive for staph epidermidis, urinary retention with ESBL Proteus UTI.  Seen by ID.  Currently on vancomycin and meropenem.  Sepsis pathophysiology has resolved, repeat blood cultures drawn on 01/23/2019 are so far negative.  Echo done 12/08/2018 unremarkable.  Per ID total 10 days of IV Vancomycin with stop date being 02/02/2019 total 10 days from the last negative cultures. However patient pulled her PICC out on 02/01/19 and did not anymore IV Meds, switched to PO Doxy in place of Vanc for 4 days on 02/02/19. Meropenem was stopped on 01/28/2019.   Acute renal failure: Due to sepsis along with home use of ACE inhibitor, NSAIDs, resolved after hydration, continue to hold offending medications.    Anion gap metabolic acidosis: To #1 and #2 above.  Resolved.  Acute metabolic encephalopathy: Due to sepsis and renal failure, much improved, nonfocal exam.  Continue supportive care.  Minimize narcotic and benzodiazepine use.  CAD s/p PCI 2014: Supportive care-no obvious anginal symptoms present.  Hypertension: Blood pressure controlled low-dose  metoprolol  Runs of narrow complex tachycardia/SVT: Stable with low-dose metoprolol.   Chronic diastolic heart failure: Volume status stable-monitor closely while on IV fluids.  Continue to hold diuretics for now.  Large multinodular goiter: Stable for outpatient follow-up-this is a chronic issue.  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  Dysphagia: SLP following advanced to dysphagia 3 diet.  Will monitor.  Mild antibiotic induced diarrhea.  No fever or leukocytosis.  Imodium.  Bladder outlet obstruction.  Continue Flomax, Foley was removed 01/28/2019, has tolerated well.  Hypomagnesemia.  Replaced will recheck in the morning.    Severe generalized weakness with generalized deconditioning.  Requires Hoyer lift to get out of the bed.  Will require SNF and PT.    Insulin-dependent DM-2: Home dose Actos has been held, with just low-dose sliding scale 3 times a day with meals she is still becoming hypoglycemic early in the morning, will discontinue sliding scale and monitor on low carb diet only.  Continue checking CBGs for now.  Lab Results  Component Value Date   HGBA1C 7.2 (H) 01/30/2019   CBG (last 3)  Recent Labs    02/04/19 1739 02/04/19 2207 02/05/19 0810  GLUCAP 115* 95 68*      DVT Prophylaxis: Prophylactic Heparin   Code Status: DNR  Family Communication: None at bedside  Disposition Plan: Remain inpatient SNF on discharge-likely next week  Antimicrobial agents:  Anti-infectives (From admission, onward)   Start     Dose/Rate Route Frequency Ordered Stop   02/02/19 1000  doxycycline (VIBRA-TABS) tablet 100 mg     100 mg Oral Every 12 hours 02/02/19 0830 02/06/19 0959   02/01/19 1800  vancomycin (VANCOCIN) 1,500 mg in sodium chloride 0.9 % 500 mL IVPB  Status:  Discontinued     1,500 mg 250 mL/hr over 120 Minutes Intravenous Every 48 hours 01/30/19 1656 02/02/19 0832   01/25/19 1500  vancomycin  (VANCOCIN) IVPB 1000 mg/200 mL premix  Status:  Discontinued     1,000 mg 200 mL/hr over 60 Minutes Intravenous Every 24 hours 01/25/19 0947 01/30/19 1656   01/25/19 1400  meropenem (MERREM) 1 g in sodium chloride 0.9 % 100 mL IVPB     1 g 200 mL/hr over 30 Minutes Intravenous Every 8 hours 01/25/19 0947 01/28/19 1444   01/24/19 1500  vancomycin (VANCOCIN) 1,500 mg in sodium chloride 0.9 % 500 mL IVPB  Status:  Discontinued     1,500 mg 250 mL/hr over 120 Minutes Intravenous Every 24 hours 01/24/19 1403 01/25/19 0947   01/23/19 2330  meropenem (MERREM) 1 g in sodium chloride 0.9 % 100 mL IVPB  Status:  Discontinued     1 g 200 mL/hr over 30 Minutes Intravenous Every 12 hours 01/23/19 2321 01/25/19 0947   01/22/19 1200  ceFEPIme (MAXIPIME) 1 g in sodium chloride 0.9 % 100 mL IVPB  Status:  Discontinued     1 g 200 mL/hr over 30 Minutes Intravenous Every 24 hours 01/21/19 1446 01/22/19 0956   01/21/19 1446  vancomycin variable dose per unstable renal function (pharmacist dosing)  Status:  Discontinued      Does not apply See admin instructions 01/21/19 1446 01/22/19 0941   01/21/19 1415  ceFEPIme (MAXIPIME) 2 g in sodium chloride 0.9 % 100 mL IVPB     2 g 200 mL/hr over 30 Minutes Intravenous  Once 01/21/19 1403 01/21/19 1449   01/21/19 1415  vancomycin (VANCOCIN) IVPB 1000 mg/200 mL premix  Status:  Discontinued     1,000 mg 200 mL/hr over 60 Minutes Intravenous  Once 01/21/19 1403 01/21/19 1411   01/21/19 1415  vancomycin (VANCOCIN) 2,500 mg in sodium chloride 0.9 % 500 mL IVPB     2,500 mg 250 mL/hr over 120 Minutes Intravenous  Once 01/21/19 1411 01/21/19 1937      Procedures: None  CONSULTS:  None  Time spent: 25 minutes-Greater than 50% of this time was spent in counseling, explanation of diagnosis, planning of further management, and coordination of care.  MEDICATIONS: Scheduled Meds: . doxycycline  100 mg Oral Q12H  . heparin  5,000 Units Subcutaneous Q8H  . metoprolol  tartrate  50 mg Oral BID  . potassium chloride  40 mEq Oral Daily  . sodium chloride flush  3 mL Intravenous Q12H  . tamsulosin  0.4 mg Oral Daily   Continuous Infusions:  PRN Meds:.acetaminophen **OR** [DISCONTINUED] acetaminophen, albuterol, loperamide, metoprolol tartrate, ondansetron (ZOFRAN) IV, senna-docusate   PHYSICAL EXAM: Vital signs: Vitals:   02/04/19 1636 02/04/19 2152 02/05/19 0332 02/05/19 0517  BP: 129/82 127/67  124/68  Pulse: (!) 101 93  89  Resp: 19 16  18   Temp: 98 F (36.7 C) 99.1 F (37.3 C)  98.4 F (36.9 C)  TempSrc: Oral Oral  Oral  SpO2: 98% 96%  96%  Weight:   106 kg   Height:       Filed Weights   02/03/19 0548 02/04/19 0419 02/05/19 0332  Weight: 109.1 kg 106.8 kg 106 kg   Body mass index is  35.02 kg/m.   Exam  Awake Alert, Oriented X 3, No new F.N deficits, Normal affect [place].AT,PERRAL Supple Neck,No JVD, No cervical lymphadenopathy appriciated.  Symmetrical Chest wall movement, Good air movement bilaterally, CTAB RRR,No Gallops, Rubs or new Murmurs, No Parasternal Heave +ve B.Sounds, Abd Soft, No tenderness, No organomegaly appriciated, No rebound - guarding or rigidity. No Cyanosis, Clubbing or edema,  bilat heal bandages   I have personally reviewed following labs and imaging studies  LABORATORY DATA: CBC: Recent Labs  Lab 02/03/19 1506  WBC 5.6  HGB 9.4*  HCT 29.4*  MCV 93.3  PLT 123*    Basic Metabolic Panel: Recent Labs  Lab 01/31/19 0444 02/03/19 1506 02/05/19 0656  NA  --  137  --   K  --  4.8  --   CL  --  109  --   CO2  --  24  --   GLUCOSE  --  120*  --   BUN  --  12  --   CREATININE 0.76 0.71  --   CALCIUM  --  8.5*  --   MG  --  1.3* 1.5*    GFR: Estimated Creatinine Clearance: 79.4 mL/min (by C-G formula based on SCr of 0.71 mg/dL).  Liver Function Tests: Recent Labs  Lab 02/03/19 1506  AST 29  ALT 28  ALKPHOS 66  BILITOT 0.8  PROT 4.6*  ALBUMIN 1.6*   No results for input(s): LIPASE,  AMYLASE in the last 168 hours. No results for input(s): AMMONIA in the last 168 hours.  Coagulation Profile: No results for input(s): INR, PROTIME in the last 168 hours.  Cardiac Enzymes: No results for input(s): CKTOTAL, CKMB, CKMBINDEX, TROPONINI in the last 168 hours.  BNP (last 3 results) No results for input(s): PROBNP in the last 8760 hours.  HbA1C: No results for input(s): HGBA1C in the last 72 hours.  CBG: Recent Labs  Lab 02/04/19 1107 02/04/19 1718 02/04/19 1739 02/04/19 2207 02/05/19 0810  GLUCAP 85 102* 115* 95 68*    Lipid Profile: No results for input(s): CHOL, HDL, LDLCALC, TRIG, CHOLHDL, LDLDIRECT in the last 72 hours.  Thyroid Function Tests: No results for input(s): TSH, T4TOTAL, FREET4, T3FREE, THYROIDAB in the last 72 hours.  Anemia Panel: No results for input(s): VITAMINB12, FOLATE, FERRITIN, TIBC, IRON, RETICCTPCT in the last 72 hours.  Urine analysis:    Component Value Date/Time   COLORURINE YELLOW 01/21/2019 1620   APPEARANCEUR TURBID (A) 01/21/2019 1620   LABSPEC 1.020 01/21/2019 1620   PHURINE 8.0 01/21/2019 1620   GLUCOSEU NEGATIVE 01/21/2019 1620   HGBUR MODERATE (A) 01/21/2019 1620   BILIRUBINUR NEGATIVE 01/21/2019 1620   KETONESUR NEGATIVE 01/21/2019 1620   PROTEINUR 100 (A) 01/21/2019 1620   UROBILINOGEN 1.0 06/28/2015 1552   NITRITE NEGATIVE 01/21/2019 1620   LEUKOCYTESUR LARGE (A) 01/21/2019 1620    Sepsis Labs: Lactic Acid, Venous    Component Value Date/Time   LATICACIDVEN 2.5 (HH) 01/21/2019 1718    MICROBIOLOGY: No results found for this or any previous visit (from the past 240 hour(s)).  RADIOLOGY STUDIES/RESULTS: Dg Chest Port 1 View  Result Date: 01/21/2019 CLINICAL DATA:  Altered mental status/nonresponsive EXAM: PORTABLE CHEST 1 VIEW COMPARISON:  Chest radiograph December 06, 2018 and chest CT December 07, 2018 FINDINGS: There is no appreciable edema or consolidation. Heart size and pulmonary vascularity are  normal. No adenopathy. Patient is undergone previous mid lower thoracic kyphoplasty procedures. There is aortic atherosclerosis. Bones are osteoporotic. IMPRESSION: No edema  or consolidation.  Stable cardiac silhouette. Aortic Atherosclerosis (ICD10-I70.0). Electronically Signed   By: Lowella Grip III M.D.   On: 01/21/2019 13:36   [place] Ekg Site Rite  Result Date: 01/26/2019 If Site Rite image not attached, placement could not be confirmed due to current cardiac rhythm.    LOS: 15 days    Signature  Lala Lund M.D on 02/05/2019 at 8:43 AM   -  To page go to [email or web address]

## 2019-02-06 LAB — CBC
HCT: 25.9 % — ABNORMAL LOW (ref 36.0–46.0)
Hemoglobin: 8.3 g/dL — ABNORMAL LOW (ref 12.0–15.0)
MCH: 30.4 pg (ref 26.0–34.0)
MCHC: 32 g/dL (ref 30.0–36.0)
MCV: 94.9 fL (ref 80.0–100.0)
Platelets: 134 10*3/uL — ABNORMAL LOW (ref 150–400)
RBC: 2.73 MIL/uL — ABNORMAL LOW (ref 3.87–5.11)
RDW: 14.6 % (ref 11.5–15.5)
WBC: 4.6 10*3/uL (ref 4.0–10.5)
nRBC: 0 % (ref 0.0–0.2)

## 2019-02-06 LAB — GLUCOSE, CAPILLARY
Glucose-Capillary: 79 mg/dL (ref 70–99)
Glucose-Capillary: 83 mg/dL (ref 70–99)
Glucose-Capillary: 120 mg/dL — ABNORMAL HIGH (ref 70–99)

## 2019-02-06 LAB — BASIC METABOLIC PANEL
Anion gap: 3 — ABNORMAL LOW (ref 5–15)
BUN: 10 mg/dL (ref 8–23)
CO2: 24 mmol/L (ref 22–32)
Calcium: 8.4 mg/dL — ABNORMAL LOW (ref 8.9–10.3)
Chloride: 110 mmol/L (ref 98–111)
Creatinine, Ser: 0.69 mg/dL (ref 0.44–1.00)
GFR calc Af Amer: >60 mL/min (ref >60)
GFR calc non Af Amer: >60 mL/min (ref >60)
GLUCOSE: 88 mg/dL (ref 70–99)
Potassium: 4.3 mmol/L (ref 3.5–5.1)
Sodium: 137 mmol/L (ref 135–145)

## 2019-02-06 LAB — MAGNESIUM: Magnesium: 1.6 mg/dL — ABNORMAL LOW (ref 1.7–2.4)

## 2019-02-06 MED ORDER — FUROSEMIDE 40 MG PO TABS
40.0000 mg | ORAL_TABLET | Freq: Every day | ORAL | Status: DC
  Administered 2019-02-06 – 2019-02-08 (×3): 40 mg via ORAL
  Filled 2019-02-06 (×3): qty 1

## 2019-02-06 MED ORDER — MAGNESIUM SULFATE 2 GM/50ML IV SOLN
2.0000 g | Freq: Once | INTRAVENOUS | Status: AC
  Administered 2019-02-06: 2 g via INTRAVENOUS
  Filled 2019-02-06: qty 50

## 2019-02-06 NOTE — Progress Notes (Signed)
Patient ID: [name], female   DOB: 05/13/1946, 73 y.o.   MRN: [id number]  PROGRESS NOTE    [name]  [id number]:[id number] DOB: [birth year] DOA: 01/21/2019 PCP: Shirline Frees, MD   Brief Narrative:  73 year old female with history of CAD status post PCI insulin-dependent, chronic diastolic heart failure, diabetes mellitus type 2, hypertension, dyslipidemia presented with acute metabolic encephalopathy secondary to acute kidney injury and sepsis with hypotension from gram-positive bacteremia.  She had 3 out of 4 bottles positive for staph epidermidis.  She also had ESBL Proteus UTI.  She was started on vancomycin and meropenem. Assessment & Plan:   Principal Problem:   Hypovolemic shock (HCC) Active Problems:   Dehydration   HTN (hypertension)   Uncontrolled type 2 diabetes mellitus with hyperglycemia (HCC)   Acute metabolic encephalopathy   Physical deconditioning   Sepsis with hypotension from bacteremia and UTI -Sepsis resolved.  Hemodynamically stable.  Staph epidermidis bacteremia ESBL Proteus UTI -She had 3 out of 4 bottles positive for staph epidermidis along with urinary retention with ESBL Proteus UTI.  She was seen by ID.  Initially treated with vancomycin and meropenem.  Repeat cultures drawn on 01/23/2019 are negative so far.  Echo done on 12/08/2018 was unremarkable.  Per ID, for 10 days of IV vancomycin with stop date being 02/02/2019, however patient pulled her pick out on 02/01/2019; IV vancomycin was switched to p.o. doxycycline.  Meropenem was stopped on 01/28/2019.  Patient has completed antibiotic therapy. -Currently afebrile.  Monitor off antibiotics  Acute renal failure -Due to sepsis and ACE inhibitor/NSAID use -Resolved. -Meds on hold  Hypomagnesemia Replace.  Repeat a.m. labs  Anion gap metabolic acidosis -Resolved.  Acute metabolic encephalopathy -From sepsis and renal failure.  Improved.  Nonfocal exam.  Continue supportive care.  Minimize narcotic  and benzodiazepine use  CAD status post PCI in 2014 -Stable.  No anginal symptoms present.  Continue supportive care.  Continue metoprolol  Hypertension -Blood pressure controlled.  Continue low-dose metoprolol.  Events of narrow complex tachycardia/SVT -Stable with low-dose metoprolol  Chronic diastolic heart failure -Strict input output.  Daily weights.  No signs of decompensated currently.  Lasix on hold.  Might resume diuretics and monitor.  Large multinodular goiter -Stable for outpatient follow-up.  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  Stable for outpatient follow-up  Dysphagia -Diet as per SLP recommendations: Dysphagia 3 diet  Bladder outlet obstruction -Continue Flomax.  Foley was removed on 01/28/2019.  Severe generalized weakness with generalized deconditioning -We will request SNF.  Social worker following  Diabetes mellitus type 2, insulin-dependent -Home Actos has been held.  Continue low-dose insulin sliding scale.   DVT prophylaxis: Heparin Code Status: DNR Family Communication: None at bedside Disposition Plan: SNF once bed is available  Consultants: None  Procedures: None  Antimicrobials:   Anti-infectives (From admission, onward)   Start     Dose/Rate Route Frequency Ordered Stop   02/02/19 1000  doxycycline (VIBRA-TABS) tablet 100 mg     100 mg Oral Every 12 hours 02/02/19 0830 02/05/19 2227   02/01/19 1800  vancomycin (VANCOCIN) 1,500 mg in sodium chloride 0.9 % 500 mL IVPB  Status:  Discontinued     1,500 mg 250 mL/hr over 120 Minutes Intravenous Every 48 hours 01/30/19 1656 02/02/19 0832   01/25/19 1500  vancomycin (VANCOCIN) IVPB 1000 mg/200 mL premix  Status:  Discontinued     1,000 mg 200 mL/hr over 60 Minutes Intravenous Every 24 hours  01/25/19 0947 01/30/19 1656   01/25/19 1400  meropenem (MERREM) 1 g in sodium chloride 0.9 % 100 mL IVPB     1 g 200 mL/hr over 30 Minutes Intravenous Every 8 hours  01/25/19 0947 01/28/19 1444   01/24/19 1500  vancomycin (VANCOCIN) 1,500 mg in sodium chloride 0.9 % 500 mL IVPB  Status:  Discontinued     1,500 mg 250 mL/hr over 120 Minutes Intravenous Every 24 hours 01/24/19 1403 01/25/19 0947   01/23/19 2330  meropenem (MERREM) 1 g in sodium chloride 0.9 % 100 mL IVPB  Status:  Discontinued     1 g 200 mL/hr over 30 Minutes Intravenous Every 12 hours 01/23/19 2321 01/25/19 0947   01/22/19 1200  ceFEPIme (MAXIPIME) 1 g in sodium chloride 0.9 % 100 mL IVPB  Status:  Discontinued     1 g 200 mL/hr over 30 Minutes Intravenous Every 24 hours 01/21/19 1446 01/22/19 0956   01/21/19 1446  vancomycin variable dose per unstable renal function (pharmacist dosing)  Status:  Discontinued      Does not apply See admin instructions 01/21/19 1446 01/22/19 0941   01/21/19 1415  ceFEPIme (MAXIPIME) 2 g in sodium chloride 0.9 % 100 mL IVPB     2 g 200 mL/hr over 30 Minutes Intravenous  Once 01/21/19 1403 01/21/19 1449   01/21/19 1415  vancomycin (VANCOCIN) IVPB 1000 mg/200 mL premix  Status:  Discontinued     1,000 mg 200 mL/hr over 60 Minutes Intravenous  Once 01/21/19 1403 01/21/19 1411   01/21/19 1415  vancomycin (VANCOCIN) 2,500 mg in sodium chloride 0.9 % 500 mL IVPB     2,500 mg 250 mL/hr over 120 Minutes Intravenous  Once 01/21/19 1411 01/21/19 1937        Subjective: Patient seen and examined at bedside.  No overnight fever, nausea or vomiting.  Objective: Vitals:   02/05/19 1454 02/05/19 2131 02/06/19 0544 02/06/19 0608  BP: 110/61 (!) 123/55 (!) 121/56   Pulse: 80 81 86   Resp: 18 16 16    Temp: 98 F (36.7 C) 98.5 F (36.9 C) 98 F (36.7 C)   TempSrc: Oral Oral Oral   SpO2: 100% 95% 93%   Weight:    106 kg  Height:        Intake/Output Summary (Last 24 hours) at 02/06/2019 0947 Last data filed at 02/06/2019 0552 Gross per 24 hour  Intake 480 ml  Output 1000 ml  Net -520 ml   Filed Weights   02/04/19 0419 02/05/19 0332 02/06/19 0608   Weight: 106.8 kg 106 kg 106 kg    Examination:  General exam: Appears calm and comfortable.  No distress.  Elderly female lying in bed.  Looks chronically ill. Respiratory system: Bilateral decreased breath sounds at bases, some scattered crackles Cardiovascular system: S1 & S2 heard, Rate controlled Gastrointestinal system: Abdomen is slightly distended, soft and nontender. Normal bowel sounds heard. Extremities: No cyanosis, clubbing; trace edema   Data Reviewed: I have personally reviewed following labs and imaging studies  CBC: Recent Labs  Lab 02/03/19 1506 02/06/19 0337  WBC 5.6 4.6  HGB 9.4* 8.3*  HCT 29.4* 25.9*  MCV 93.3 94.9  PLT 123* 169*   Basic Metabolic Panel: Recent Labs  Lab 01/31/19 0444 02/03/19 1506 02/05/19 0656 02/06/19 0337  NA  --  137  --  137  K  --  4.8  --  4.3  CL  --  109  --  110  CO2  --  24  --  24  GLUCOSE  --  120*  --  88  BUN  --  12  --  10  CREATININE 0.76 0.71  --  0.69  CALCIUM  --  8.5*  --  8.4*  MG  --  1.3* 1.5* 1.6*   GFR: Estimated Creatinine Clearance: 79.4 mL/min (by C-G formula based on SCr of 0.69 mg/dL). Liver Function Tests: Recent Labs  Lab 02/03/19 1506  AST 29  ALT 28  ALKPHOS 66  BILITOT 0.8  PROT 4.6*  ALBUMIN 1.6*   No results for input(s): LIPASE, AMYLASE in the last 168 hours. No results for input(s): AMMONIA in the last 168 hours. Coagulation Profile: No results for input(s): INR, PROTIME in the last 168 hours. Cardiac Enzymes: No results for input(s): CKTOTAL, CKMB, CKMBINDEX, TROPONINI in the last 168 hours. BNP (last 3 results) No results for input(s): PROBNP in the last 8760 hours. HbA1C: No results for input(s): HGBA1C in the last 72 hours. CBG: Recent Labs  Lab 02/04/19 2207 02/05/19 0810 02/05/19 1151 02/05/19 1653 02/05/19 2134  GLUCAP 95 68* 106* 104* 111*   Lipid Profile: No results for input(s): CHOL, HDL, LDLCALC, TRIG, CHOLHDL, LDLDIRECT in the last 72  hours. Thyroid Function Tests: No results for input(s): TSH, T4TOTAL, FREET4, T3FREE, THYROIDAB in the last 72 hours. Anemia Panel: No results for input(s): VITAMINB12, FOLATE, FERRITIN, TIBC, IRON, RETICCTPCT in the last 72 hours. Sepsis Labs: No results for input(s): PROCALCITON, LATICACIDVEN in the last 168 hours.  No results found for this or any previous visit (from the past 240 hour(s)).       Radiology Studies: No results found.      Scheduled Meds: . feeding supplement (GLUCERNA SHAKE)  237 mL Oral Q24H  . heparin  5,000 Units Subcutaneous Q8H  . metoprolol tartrate  50 mg Oral BID  . potassium chloride  40 mEq Oral Daily  . sodium chloride flush  3 mL Intravenous Q12H  . tamsulosin  0.4 mg Oral Daily   Continuous Infusions: . magnesium sulfate 1 - 4 g bolus IVPB       LOS: 16 days        Aline August, MD [facility] 02/06/2019, 9:47 AM

## 2019-02-06 NOTE — Progress Notes (Addendum)
Physical Therapy Treatment Patient Details Name: [name] MRN: [id number] DOB: [birth year] Today's Date: 02/06/2019    History of Present Illness Pt is a 73 y.o. female admitted 01/21/19 with confusion, incontinence and chest pain. Worked up for sepsis with hypotension and metabolic encephalopathy secondary to staph epidermidis bacteremia and ESBL Proteus UTI. Suspected mild dementia. PMH includes CAD, CHF, DM2, HTN, HLD; recent hospitalizaiton s1/9-1/14/20.    PT Comments    Continuing work on functional mobility and activity tolerance;  [name] continues to require +2 Total assist with mobility related to sitting unsupported, and getting in and out of sitting position; Noting improved rolling in bed this session; Unfortunately, she adamantly refuses using the Maximove to get up and OOB -- this is right now the safest method for her and staff safety with transfers [place]; Continue to recommend SNF for rehabilitation   Follow Up Recommendations  SNF;Supervision/Assistance - 24 hour     Equipment Recommendations  None recommended by PT    Recommendations for Other Services       Precautions / Restrictions Precautions Precautions: Fall    Mobility  Bed Mobility Overal bed mobility: Needs Assistance Bed Mobility: Rolling;Sidelying to Sit;Sit to Supine Rolling: Mod assist Sidelying to sit: +2 for physical assistance;Total assist   Sit to supine: +2 for physical assistance;Total assist   General bed mobility comments: Rolled R and L with use of bedrails and light mod assist for placement of bedpan, removal of bedpan, and hygeine and pad change after bedpan; in sidelying, Used bed pad to help flex hips and clear Bil LEs over EOB in prep for sitting; Then Total assist to elevat trunk to sit; Unable to tolerate, and she pushed back, laying down perpendicular in bed with her feet and lower legs off the bed; Total Assist of 2 (in front and back of patient) to pull back up to sit; very  close guard of knees while sitting EOB; Did not tolerate long, and assisted back to supine  Transfers                 General transfer comment: Refusing Maximove transfer, and not stable enough sitting EOB to perform lateral scoot transfer  Ambulation/Gait                 Stairs             Wheelchair Mobility    Modified Rankin (Stroke Patients Only)       Balance     Sitting balance-Leahy Scale: Zero Sitting balance - Comments: Pt unable to sit EOB without total A +2. Pt attempted multiple times during sitting to push posterior hard to lay back down in bed.  Postural control: Posterior lean;Other (comment)(unable to lift head up)                                  Cognition Arousal/Alertness: Awake/alert Behavior During Therapy: Anxious Overall Cognitive Status: No family/caregiver present to determine baseline cognitive functioning Area of Impairment: Attention;Memory;Following commands;Safety/judgement;Awareness;Problem solving                     Memory: Decreased short-term memory Following Commands: Follows one step commands inconsistently Safety/Judgement: Decreased awareness of safety;Decreased awareness of deficits Awareness: Intellectual Problem Solving: Decreased initiation;Requires verbal cues;Requires tactile cues;Slow processing;Difficulty sequencing General Comments: tangential speech throughout and needing max cuing to attend to task      Exercises  General Comments        Pertinent Vitals/Pain Pain Assessment: Faces Faces Pain Scale: Hurts whole lot Pain Location: RLE with hip and knee flexion in bed Pain Descriptors / Indicators: Grimacing;Moaning;Crying Pain Intervention(s): Monitored during session;Limited activity within patient's tolerance    Home Living                      Prior Function            PT Goals (current goals can now be found in the care plan section) Acute Rehab PT  Goals Patient Stated Goal: None stated PT Goal Formulation: With patient Time For Goal Achievement: 02/20/19 Potential to Achieve Goals: Poor Progress towards PT goals: Not progressing toward goals - comment(Declining options for safe transfers)    Frequency    Min 2X/week      PT Plan Current plan remains appropriate    Co-evaluation              AM-PAC PT "6 Clicks" Mobility   Outcome Measure  Help needed turning from your back to your side while in a flat bed without using bedrails?: A Little Help needed moving from lying on your back to sitting on the side of a flat bed without using bedrails?: Total Help needed moving to and from a bed to a chair (including a wheelchair)?: Total Help needed standing up from a chair using your arms (e.g., wheelchair or bedside chair)?: Total Help needed to walk in hospital room?: Total Help needed climbing 3-5 steps with a railing? : Total 6 Click Score: 8    End of Session Equipment Utilized During Treatment: Other (comment)(bed pad and bed pan) Activity Tolerance: Patient limited by pain;Other (comment)(Decr participation) Patient left: in bed;with call bell/phone within reach;with bed alarm set;Other (comment)(Bed to semi-chair position)   PT Visit Diagnosis: Muscle weakness (generalized) (M62.81) Pain - part of body: Leg;Arm     Time: 5852-7782 PT Time Calculation (min) (ACUTE ONLY): 21 min  Charges:  $Therapeutic Activity: 8-22 mins                     Roney Marion, PT  Acute Rehabilitation Services Pager [phone] Office [place] 02/06/2019, 1:02 PM

## 2019-02-07 LAB — BASIC METABOLIC PANEL
Anion gap: 6 (ref 5–15)
BUN: 9 mg/dL (ref 8–23)
CO2: 22 mmol/L (ref 22–32)
Calcium: 8.3 mg/dL — ABNORMAL LOW (ref 8.9–10.3)
Chloride: 108 mmol/L (ref 98–111)
Creatinine, Ser: 0.71 mg/dL (ref 0.44–1.00)
GFR calc Af Amer: >60 mL/min (ref >60)
GFR calc non Af Amer: >60 mL/min (ref >60)
Glucose, Bld: 94 mg/dL (ref 70–99)
Potassium: 4.4 mmol/L (ref 3.5–5.1)
Sodium: 136 mmol/L (ref 135–145)

## 2019-02-07 LAB — CBC WITH DIFFERENTIAL/PLATELET
Abs Immature Granulocytes: 0.01 10*3/uL (ref 0.00–0.07)
Basophils Absolute: 0 10*3/uL (ref 0.0–0.1)
Basophils Relative: 1 %
Eosinophils Absolute: 0.2 10*3/uL (ref 0.0–0.5)
Eosinophils Relative: 4 %
HCT: 28.3 % — ABNORMAL LOW (ref 36.0–46.0)
Hemoglobin: 8.7 g/dL — ABNORMAL LOW (ref 12.0–15.0)
Immature Granulocytes: 0 %
Lymphocytes Relative: 35 %
Lymphs Abs: 1.6 10*3/uL (ref 0.7–4.0)
MCH: 29.1 pg (ref 26.0–34.0)
MCHC: 30.7 g/dL (ref 30.0–36.0)
MCV: 94.6 fL (ref 80.0–100.0)
Monocytes Absolute: 0.7 10*3/uL (ref 0.1–1.0)
Monocytes Relative: 15 %
Neutro Abs: 2 10*3/uL (ref 1.7–7.7)
Neutrophils Relative %: 45 %
Platelets: 148 10*3/uL — ABNORMAL LOW (ref 150–400)
RBC: 2.99 MIL/uL — ABNORMAL LOW (ref 3.87–5.11)
RDW: 14.4 % (ref 11.5–15.5)
WBC: 4.5 10*3/uL (ref 4.0–10.5)
nRBC: 0 % (ref 0.0–0.2)

## 2019-02-07 LAB — GLUCOSE, CAPILLARY
GLUCOSE-CAPILLARY: 155 mg/dL — AB (ref 70–99)
Glucose-Capillary: 88 mg/dL (ref 70–99)
Glucose-Capillary: 98 mg/dL (ref 70–99)
Glucose-Capillary: 94 mg/dL (ref 70–99)

## 2019-02-07 LAB — MAGNESIUM: Magnesium: 1.6 mg/dL — ABNORMAL LOW (ref 1.7–2.4)

## 2019-02-07 MED ORDER — MAGNESIUM SULFATE 2 GM/50ML IV SOLN
2.0000 g | Freq: Once | INTRAVENOUS | Status: AC
  Administered 2019-02-07: 2 g via INTRAVENOUS
  Filled 2019-02-07: qty 50

## 2019-02-07 NOTE — Progress Notes (Signed)
Patient ID: [name], female   DOB: 1946/05/06, 73 y.o.   MRN: [id number]  PROGRESS NOTE    [name]  [id number]:[id number] DOB: [birth year] DOA: 01/21/2019 PCP: Shirline Frees, MD   Brief Narrative:  73 year old female with history of CAD status post PCI insulin-dependent, chronic diastolic heart failure, diabetes mellitus type 2, hypertension, dyslipidemia presented with acute metabolic encephalopathy secondary to acute kidney injury and sepsis with hypotension from gram-positive bacteremia.  She had 3 out of 4 bottles positive for staph epidermidis.  She also had ESBL Proteus UTI.  She was started on vancomycin and meropenem. Assessment & Plan:   Principal Problem:   Hypovolemic shock (HCC) Active Problems:   Dehydration   HTN (hypertension)   Uncontrolled type 2 diabetes mellitus with hyperglycemia (HCC)   Acute metabolic encephalopathy   Physical deconditioning   Sepsis with hypotension from bacteremia and UTI -Sepsis resolved.  Hemodynamically stable.  Staph epidermidis bacteremia ESBL Proteus UTI -She had 3 out of 4 bottles positive for staph epidermidis along with urinary retention with ESBL Proteus UTI.  She was seen by ID.  Initially treated with vancomycin and meropenem.  Repeat cultures drawn on 01/23/2019 are negative so far.  Echo done on 12/08/2018 was unremarkable.  Per ID, for 10 days of IV vancomycin with stop date being 02/02/2019, however patient pulled her pick out on 02/01/2019; IV vancomycin was switched to p.o. doxycycline.  Meropenem was stopped on 01/28/2019.  Patient has completed antibiotic therapy. -Currently afebrile.  Monitor off antibiotics  Acute renal failure -Due to sepsis and ACE inhibitor/NSAID use -Resolved. -Meds on hold  Hypomagnesemia -Replace.  Anion gap metabolic acidosis -Resolved.  Acute metabolic encephalopathy -From sepsis and renal failure.  Improved.  Nonfocal exam.  Continue supportive care.  Minimize narcotic and benzodiazepine  use  CAD status post PCI in 2014 -Stable.  No anginal symptoms present.  Continue supportive care.  Continue metoprolol  Hypertension -Blood pressure controlled.  Continue low-dose metoprolol.  Events of narrow complex tachycardia/SVT -Stable with low-dose metoprolol  Chronic diastolic heart failure -Strict input output.  Daily weights.  No signs of decompensated currently.  Lasix on hold.  Might resume diuretics and monitor.  Large multinodular goiter -Stable for outpatient follow-up.  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  Stable for outpatient follow-up  Dysphagia -Diet as per SLP recommendations: Dysphagia 3 diet  Bladder outlet obstruction -Continue Flomax.  Foley was removed on 01/28/2019.  Severe generalized weakness with generalized deconditioning -PT recommending SNF.  Social worker following  Diabetes mellitus type 2, insulin-dependent -Home Actos has been held.  Continue low-dose insulin sliding scale.   DVT prophylaxis: Heparin Code Status: DNR Family Communication: None at bedside Disposition Plan: SNF once bed is available  Consultants: None  Procedures: None  Antimicrobials:   Anti-infectives (From admission, onward)   Start     Dose/Rate Route Frequency Ordered Stop   02/02/19 1000  doxycycline (VIBRA-TABS) tablet 100 mg     100 mg Oral Every 12 hours 02/02/19 0830 02/05/19 2227   02/01/19 1800  vancomycin (VANCOCIN) 1,500 mg in sodium chloride 0.9 % 500 mL IVPB  Status:  Discontinued     1,500 mg 250 mL/hr over 120 Minutes Intravenous Every 48 hours 01/30/19 1656 02/02/19 0832   01/25/19 1500  vancomycin (VANCOCIN) IVPB 1000 mg/200 mL premix  Status:  Discontinued     1,000 mg 200 mL/hr over 60 Minutes Intravenous Every 24 hours 01/25/19 0947 01/30/19 1656  01/25/19 1400  meropenem (MERREM) 1 g in sodium chloride 0.9 % 100 mL IVPB     1 g 200 mL/hr over 30 Minutes Intravenous Every 8 hours 01/25/19 0947  01/28/19 1444   01/24/19 1500  vancomycin (VANCOCIN) 1,500 mg in sodium chloride 0.9 % 500 mL IVPB  Status:  Discontinued     1,500 mg 250 mL/hr over 120 Minutes Intravenous Every 24 hours 01/24/19 1403 01/25/19 0947   01/23/19 2330  meropenem (MERREM) 1 g in sodium chloride 0.9 % 100 mL IVPB  Status:  Discontinued     1 g 200 mL/hr over 30 Minutes Intravenous Every 12 hours 01/23/19 2321 01/25/19 0947   01/22/19 1200  ceFEPIme (MAXIPIME) 1 g in sodium chloride 0.9 % 100 mL IVPB  Status:  Discontinued     1 g 200 mL/hr over 30 Minutes Intravenous Every 24 hours 01/21/19 1446 01/22/19 0956   01/21/19 1446  vancomycin variable dose per unstable renal function (pharmacist dosing)  Status:  Discontinued      Does not apply See admin instructions 01/21/19 1446 01/22/19 0941   01/21/19 1415  ceFEPIme (MAXIPIME) 2 g in sodium chloride 0.9 % 100 mL IVPB     2 g 200 mL/hr over 30 Minutes Intravenous  Once 01/21/19 1403 01/21/19 1449   01/21/19 1415  vancomycin (VANCOCIN) IVPB 1000 mg/200 mL premix  Status:  Discontinued     1,000 mg 200 mL/hr over 60 Minutes Intravenous  Once 01/21/19 1403 01/21/19 1411   01/21/19 1415  vancomycin (VANCOCIN) 2,500 mg in sodium chloride 0.9 % 500 mL IVPB     2,500 mg 250 mL/hr over 120 Minutes Intravenous  Once 01/21/19 1411 01/21/19 1937      Subjective: Patient seen and examined at bedside.  No overnight fever, nausea or vomiting.  She is hoping to be discharged to SNF today.  Objective: Vitals:   02/06/19 2141 02/07/19 0525 02/07/19 0625 02/07/19 1231  BP: 123/63 120/68  98/80  Pulse: 73 81  83  Resp: 15 16  20   Temp: 98.7 F (37.1 C) 97.8 F (36.6 C)    TempSrc: Oral Oral    SpO2: 95% 96%  97%  Weight:   105.8 kg   Height:        Intake/Output Summary (Last 24 hours) at 02/07/2019 1309 Last data filed at 02/07/2019 1149 Gross per 24 hour  Intake 243 ml  Output 850 ml  Net -607 ml   Filed Weights   02/05/19 0332 02/06/19 0608 02/07/19 0625   Weight: 106 kg 106 kg 105.8 kg    Examination:  General exam: Appears calm and comfortable.  No acute distress.  Elderly female lying in bed.  Looks chronically ill. Respiratory system: Bilateral decreased breath sounds at bases, some scattered crackles.  No wheezing Cardiovascular system: S1 & S2 heard, Rate controlled Gastrointestinal system: Abdomen is slightly distended, soft and nontender. Normal bowel sounds heard. Extremities: No cyanosis; trace edema   Data Reviewed: I have personally reviewed following labs and imaging studies  CBC: Recent Labs  Lab 02/03/19 1506 02/06/19 0337 02/07/19 0332  WBC 5.6 4.6 4.5  NEUTROABS  --   --  2.0  HGB 9.4* 8.3* 8.7*  HCT 29.4* 25.9* 28.3*  MCV 93.3 94.9 94.6  PLT 123* 134* 222*   Basic Metabolic Panel: Recent Labs  Lab 02/03/19 1506 02/05/19 0656 02/06/19 0337 02/07/19 0332  NA 137  --  137 136  K 4.8  --  4.3 4.4  CL 109  --  110 108  CO2 24  --  24 22  GLUCOSE 120*  --  88 94  BUN 12  --  10 9  CREATININE 0.71  --  0.69 0.71  CALCIUM 8.5*  --  8.4* 8.3*  MG 1.3* 1.5* 1.6* 1.6*   GFR: Estimated Creatinine Clearance: 79.3 mL/min (by C-G formula based on SCr of 0.71 mg/dL). Liver Function Tests: Recent Labs  Lab 02/03/19 1506  AST 29  ALT 28  ALKPHOS 66  BILITOT 0.8  PROT 4.6*  ALBUMIN 1.6*   No results for input(s): LIPASE, AMYLASE in the last 168 hours. No results for input(s): AMMONIA in the last 168 hours. Coagulation Profile: No results for input(s): INR, PROTIME in the last 168 hours. Cardiac Enzymes: No results for input(s): CKTOTAL, CKMB, CKMBINDEX, TROPONINI in the last 168 hours. BNP (last 3 results) No results for input(s): PROBNP in the last 8760 hours. HbA1C: No results for input(s): HGBA1C in the last 72 hours. CBG: Recent Labs  Lab 02/06/19 1137 02/06/19 1727 02/06/19 2142 02/07/19 0837 02/07/19 1146  GLUCAP 79 83 120* 88 98   Lipid Profile: No results for input(s): CHOL, HDL,  LDLCALC, TRIG, CHOLHDL, LDLDIRECT in the last 72 hours. Thyroid Function Tests: No results for input(s): TSH, T4TOTAL, FREET4, T3FREE, THYROIDAB in the last 72 hours. Anemia Panel: No results for input(s): VITAMINB12, FOLATE, FERRITIN, TIBC, IRON, RETICCTPCT in the last 72 hours. Sepsis Labs: No results for input(s): PROCALCITON, LATICACIDVEN in the last 168 hours.  No results found for this or any previous visit (from the past 240 hour(s)).       Radiology Studies: No results found.      Scheduled Meds: . feeding supplement (GLUCERNA SHAKE)  237 mL Oral Q24H  . furosemide  40 mg Oral Daily  . heparin  5,000 Units Subcutaneous Q8H  . metoprolol tartrate  50 mg Oral BID  . potassium chloride  40 mEq Oral Daily  . sodium chloride flush  3 mL Intravenous Q12H  . tamsulosin  0.4 mg Oral Daily   Continuous Infusions:    LOS: 17 days        Aline August, MD [facility] 02/07/2019, 1:09 PM

## 2019-02-08 LAB — GLUCOSE, CAPILLARY
GLUCOSE-CAPILLARY: 112 mg/dL — AB (ref 70–99)
Glucose-Capillary: 86 mg/dL (ref 70–99)
Glucose-Capillary: 139 mg/dL — ABNORMAL HIGH (ref 70–99)

## 2019-02-08 MED ORDER — TAMSULOSIN HCL 0.4 MG PO CAPS: 0.4000 mg | ORAL_CAPSULE | Freq: Every day | ORAL | Status: AC

## 2019-02-08 NOTE — Discharge Summary (Signed)
Physician Discharge Summary  [name] NWG:[id number] DOB: Apr 24, 1946 DOA: 01/21/2019  PCP: Shirline Frees, MD  Admit date: 01/21/2019 Discharge date: 02/08/2019  Admitted From: Home Disposition:  Home  Recommendations for Outpatient Follow-up:  1. Follow up with PCP in 1 weeks 2. Follow up in ED if symptoms worsen or new appear   Home Health: No Equipment/Devices: None  Discharge Condition: Stable CODE STATUS: DNR Diet recommendation: Heart healthy/carb modified/dysphagia 3 diet  Brief/Interim Summary: 73 year old female with history of CAD status post PCI insulin-dependent, chronic diastolic heart failure, diabetes mellitus type 2, hypertension, dyslipidemia presented with acute metabolic encephalopathy secondary to acute kidney injury and sepsis with hypotension from gram-positive bacteremia.  She had 3 out of 4 bottles positive for staph epidermidis.  She also had ESBL Proteus UTI.  She was started on vancomycin and meropenem.  Discharge Diagnoses:  Principal Problem:   Hypovolemic shock ([place]) Active Problems:   Dehydration   HTN (hypertension)   Uncontrolled type 2 diabetes mellitus with hyperglycemia (HCC)   Acute metabolic encephalopathy   Physical deconditioning  Sepsis with hypotension from bacteremia and UTI -Sepsis resolved.  Hemodynamically stable.  Staph epidermidis bacteremia ESBL Proteus UTI -She had 3 out of 4 bottles positive for staph epidermidis along with urinary retention with ESBL Proteus UTI.  She was seen by ID.  Initially treated with vancomycin and meropenem.  Repeat cultures drawn on 01/23/2019 are negative so far.  Echo done on 12/08/2018 was unremarkable.  Per ID,  10 days of IV vancomycin with stop date being 02/02/2019, however patient pulled her pick out on 02/01/2019; IV vancomycin was switched to p.o. doxycycline.  Meropenem was stopped on 01/28/2019.  Patient has completed antibiotic therapy. -Currently afebrile.  Monitor off  antibiotics  Acute renal failure -Due to sepsis and ACE inhibitor/NSAID use -Resolved. -ACE inhibitor will remain on hold.  Hypomagnesemia -Replaced.  Anion gap metabolic acidosis -Resolved.  Acute metabolic encephalopathy -From sepsis and renal failure.  Improved.  Nonfocal exam.  Continue supportive care.  Minimize narcotic and benzodiazepine use  CAD status post PCI in 2014 -Stable.  No anginal symptoms present.  Continue supportive care.  Continue metoprolol  Hypertension -Blood pressure controlled.  Continue low-dose metoprolol.  Events of narrow complex tachycardia/SVT -Stable with low-dose metoprolol  Chronic diastolic heart failure -Strict input output.  Daily weights.  No signs of decompensated currently.  Lasix has been resumed.  Outpatient follow-up with cardiology.  Large multinodular goiter -Stable for outpatient follow-up.  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  Stable for outpatient follow-up  Dysphagia -Diet as per SLP recommendations: Dysphagia 3 diet  Bladder outlet obstruction -Continue Flomax.  Foley was removed on 01/28/2019.  Severe generalized weakness with generalized deconditioning -PT recommending SNF.  Discharge to SNF today.  Diabetes mellitus type 2, insulin-dependent -Home Actos has been held.  Outpatient follow-up.  Blood sugars on the lower side.  Discharge Instructions  Discharge Instructions    Diet - low sodium heart healthy   Complete by:  As directed    Diet Carb Modified   Complete by:  As directed    Increase activity slowly   Complete by:  As directed      Allergies as of 02/08/2019      Reactions   Oxycodone Nausea Only      Medication List    STOP taking these medications   Lantus SoloStar 100 UNIT/ML Solostar Pen Generic drug:  Insulin Glargine   ondansetron 4 MG  disintegrating tablet Commonly known as:  Zofran ODT   pioglitazone 30 MG tablet Commonly  known as:  ACTOS   ramipril 10 MG capsule Commonly known as:  ALTACE   sertraline 50 MG tablet Commonly known as:  ZOLOFT   vancomycin 125 MG capsule Commonly known as:  VANCOCIN     TAKE these medications   acetaminophen 325 MG tablet Commonly known as:  TYLENOL Take 650 mg by mouth every 6 (six) hours as needed for mild pain.   calcium-vitamin D 500-200 MG-UNIT tablet Commonly known as:  OSCAL WITH D Take 1 tablet by mouth daily.   furosemide 40 MG tablet Commonly known as:  LASIX Take 40 mg by mouth daily.   ipratropium-albuterol 0.5-2.5 (3) MG/3ML Soln Commonly known as:  DUONEB Take 3 mLs by nebulization every 6 (six) hours as needed (SOB).   lidocaine 5 % Commonly known as:  LIDODERM Place 1 patch onto the skin daily. Remove & Discard patch within 12 hours or as directed by MD . Apply over left lateral chest   metoprolol tartrate 50 MG tablet Commonly known as:  LOPRESSOR Take 1 tablet (50 mg total) by mouth 2 (two) times daily. PLEASE CONTACT OFFICE FOR ADDITIONAL REFILLS   multivitamin with minerals Tabs tablet Take 1 tablet by mouth daily.   nitroGLYCERIN 0.4 MG SL tablet Commonly known as:  NITROSTAT Place 1 tablet (0.4 mg total) under the tongue every 5 (five) minutes as needed for chest pain.   ondansetron 4 MG tablet Commonly known as:  ZOFRAN Take 4 mg by mouth every 8 (eight) hours as needed for nausea or vomiting.   simvastatin 40 MG tablet Commonly known as:  ZOCOR Take 40 mg by mouth at bedtime.   tamsulosin 0.4 MG Caps capsule Commonly known as:  FLOMAX Take 1 capsule (0.4 mg total) by mouth daily.   vitamin B-12 1000 MCG tablet Commonly known as:  CYANOCOBALAMIN Take 1,000 mcg by mouth daily.   Vitamin D 50 MCG (2000 UT) Caps Take 2,000 Units by mouth at bedtime.      Contact information for after-discharge care    Destination    [facility] .   Service:  Skilled Nursing Contact information: [place] [phone]             Allergies  Allergen Reactions  . Oxycodone Nausea Only    Consultations:  ID   Procedures/Studies: Dg Chest Port 1 View  Result Date: 01/21/2019 CLINICAL DATA:  Altered mental status/nonresponsive EXAM: PORTABLE CHEST 1 VIEW COMPARISON:  Chest radiograph December 06, 2018 and chest CT December 07, 2018 FINDINGS: There is no appreciable edema or consolidation. Heart size and pulmonary vascularity are normal. No adenopathy. Patient is undergone previous mid lower thoracic kyphoplasty procedures. There is aortic atherosclerosis. Bones are osteoporotic. IMPRESSION: No edema or consolidation.  Stable cardiac silhouette. Aortic Atherosclerosis (ICD10-I70.0). Electronically Signed   By: Lowella Grip III M.D.   On: 01/21/2019 13:36   [place] Ekg Site Rite  Result Date: 01/26/2019 If Site Rite image not attached, placement could not be confirmed due to current cardiac rhythm.      Subjective: Patient seen and examined at bedside.  She denies any overnight fever, nausea or vomiting.  Feels okay to go to SNF today.  Discharge Exam: Vitals:   02/07/19 2142 02/08/19 0559  BP: 126/66 (!) 114/44  Pulse: 86 95  Resp: 18 15  Temp: 98.5 F (36.9 C) 98.4 F (36.9  C)  SpO2: 94% 95%   Vitals:   02/07/19 1231 02/07/19 2142 02/08/19 0252 02/08/19 0559  BP: 98/80 126/66  (!) 114/44  Pulse: 83 86  95  Resp: 20 18  15   Temp:  98.5 F (36.9 C)  98.4 F (36.9 C)  TempSrc:  Oral  Oral  SpO2: 97% 94%  95%  Weight:   104.7 kg   Height:        General exam: Appears calm and comfortable.  No distress.  Elderly female lying in bed.  Looks chronically ill. Respiratory system: Bilateral decreased breath sounds at bases, some scattered crackles Cardiovascular system: S1 & S2 heard, Rate controlled Gastrointestinal system: Abdomen is slightly distended, soft and nontender. Normal bowel sounds heard. Extremities: No cyanosis,  clubbing; trace edema    The results of significant diagnostics from this hospitalization (including imaging, microbiology, ancillary and laboratory) are listed below for reference.     Microbiology: No results found for this or any previous visit (from the past 240 hour(s)).   Labs: BNP (last 3 results) No results for input(s): BNP in the last 8760 hours. Basic Metabolic Panel: Recent Labs  Lab 02/03/19 1506 02/05/19 0656 02/06/19 0337 02/07/19 0332  NA 137  --  137 136  K 4.8  --  4.3 4.4  CL 109  --  110 108  CO2 24  --  24 22  GLUCOSE 120*  --  88 94  BUN 12  --  10 9  CREATININE 0.71  --  0.69 0.71  CALCIUM 8.5*  --  8.4* 8.3*  MG 1.3* 1.5* 1.6* 1.6*   Liver Function Tests: Recent Labs  Lab 02/03/19 1506  AST 29  ALT 28  ALKPHOS 66  BILITOT 0.8  PROT 4.6*  ALBUMIN 1.6*   No results for input(s): LIPASE, AMYLASE in the last 168 hours. No results for input(s): AMMONIA in the last 168 hours. CBC: Recent Labs  Lab 02/03/19 1506 02/06/19 0337 02/07/19 0332  WBC 5.6 4.6 4.5  NEUTROABS  --   --  2.0  HGB 9.4* 8.3* 8.7*  HCT 29.4* 25.9* 28.3*  MCV 93.3 94.9 94.6  PLT 123* 134* 148*   Cardiac Enzymes: No results for input(s): CKTOTAL, CKMB, CKMBINDEX, TROPONINI in the last 168 hours. BNP: Invalid input(s): POCBNP CBG: Recent Labs  Lab 02/07/19 0837 02/07/19 1146 02/07/19 1741 02/07/19 2138 02/08/19 0735  GLUCAP 88 98 155* 94 86   D-Dimer No results for input(s): DDIMER in the last 72 hours. Hgb A1c No results for input(s): HGBA1C in the last 72 hours. Lipid Profile No results for input(s): CHOL, HDL, LDLCALC, TRIG, CHOLHDL, LDLDIRECT in the last 72 hours. Thyroid function studies No results for input(s): TSH, T4TOTAL, T3FREE, THYROIDAB in the last 72 hours.  Invalid input(s): FREET3 Anemia work up No results for input(s): VITAMINB12, FOLATE, FERRITIN, TIBC, IRON, RETICCTPCT in the last 72 hours. Urinalysis    Component Value Date/Time    COLORURINE YELLOW 01/21/2019 1620   APPEARANCEUR TURBID (A) 01/21/2019 1620   LABSPEC 1.020 01/21/2019 1620   PHURINE 8.0 01/21/2019 1620   GLUCOSEU NEGATIVE 01/21/2019 1620   HGBUR MODERATE (A) 01/21/2019 1620   BILIRUBINUR NEGATIVE 01/21/2019 1620   KETONESUR NEGATIVE 01/21/2019 1620   PROTEINUR 100 (A) 01/21/2019 1620   UROBILINOGEN 1.0 06/28/2015 1552   NITRITE NEGATIVE 01/21/2019 1620   LEUKOCYTESUR LARGE (A) 01/21/2019 1620   Sepsis Labs Invalid input(s): PROCALCITONIN,  WBC,  LACTICIDVEN Microbiology No results found for this or  any previous visit (from the past 240 hour(s)).   Time coordinating discharge: 35 minutes  SIGNED:   Aline August, MD  [facility] 02/08/2019, 9:19 AM

## 2019-02-08 NOTE — Progress Notes (Signed)
Patient will DC to: [facility] Anticipated DC date: 02/08/19 Family notified: Sheilah Mins Transport by: Corey Harold (when SNF says ok to send)   Per MD patient ready for DC to [facility]. RN, patient, patient's family, and facility notified of DC. Discharge Summary and FL2 sent to facility. RN to call report prior to discharge [phone]). DC packet on chart. Ambulance transport requested for patient.   CSW will sign off for now as social work intervention is no longer needed. Please consult [place] again if new needs arise.  Cedric Fishman, LCSW Clinical Social Worker [phone]

## 2019-02-08 NOTE — Progress Notes (Signed)
[name] has received Biochemist, clinical. Patient's friend [name] to help complete admission paperwork. Patient will require PTAR.   Melissa Locus Ikeya Brockel LCSW [phone]

## 2019-02-08 NOTE — Progress Notes (Signed)
Nsg Discharge Note  Admit Date:  01/21/2019 Discharge date: 02/08/2019   [name] to be D/C'd [facility],  To [facility] RN report called to per MD order.  AVS completed.  Copy for chart, and copy sent with ems. Patient/caregiver able to verbalize understanding.  Discharge Medication: Allergies as of 02/08/2019      Reactions   Oxycodone Nausea Only      Medication List    STOP taking these medications   Lantus SoloStar 100 UNIT/ML Solostar Pen Generic drug:  Insulin Glargine   ondansetron 4 MG disintegrating tablet Commonly known as:  Zofran ODT   pioglitazone 30 MG tablet Commonly known as:  ACTOS   ramipril 10 MG capsule Commonly known as:  ALTACE   sertraline 50 MG tablet Commonly known as:  ZOLOFT   vancomycin 125 MG capsule Commonly known as:  VANCOCIN     TAKE these medications   acetaminophen 325 MG tablet Commonly known as:  TYLENOL Take 650 mg by mouth every 6 (six) hours as needed for mild pain.   calcium-vitamin D 500-200 MG-UNIT tablet Commonly known as:  OSCAL WITH D Take 1 tablet by mouth daily.   furosemide 40 MG tablet Commonly known as:  LASIX Take 40 mg by mouth daily.   ipratropium-albuterol 0.5-2.5 (3) MG/3ML Soln Commonly known as:  DUONEB Take 3 mLs by nebulization every 6 (six) hours as needed (SOB).   lidocaine 5 % Commonly known as:  LIDODERM Place 1 patch onto the skin daily. Remove & Discard patch within 12 hours or as directed by MD . Apply over left lateral chest   metoprolol tartrate 50 MG tablet Commonly known as:  LOPRESSOR Take 1 tablet (50 mg total) by mouth 2 (two) times daily. PLEASE CONTACT OFFICE FOR ADDITIONAL REFILLS   multivitamin with minerals Tabs tablet Take 1 tablet by mouth daily.   nitroGLYCERIN 0.4 MG SL tablet Commonly known as:  NITROSTAT Place 1 tablet (0.4 mg total) under the tongue every 5 (five) minutes as needed for chest pain.   ondansetron 4 MG tablet Commonly known as:   ZOFRAN Take 4 mg by mouth every 8 (eight) hours as needed for nausea or vomiting.   simvastatin 40 MG tablet Commonly known as:  ZOCOR Take 40 mg by mouth at bedtime.   tamsulosin 0.4 MG Caps capsule Commonly known as:  FLOMAX Take 1 capsule (0.4 mg total) by mouth daily.   vitamin B-12 1000 MCG tablet Commonly known as:  CYANOCOBALAMIN Take 1,000 mcg by mouth daily.   Vitamin D 50 MCG (2000 UT) Caps Take 2,000 Units by mouth at bedtime.       Discharge Assessment: Vitals:   02/07/19 2142 02/08/19 0559  BP: 126/66 (!) 114/44  Pulse: 86 95  Resp: 18 15  Temp: 98.5 F (36.9 C) 98.4 F (36.9 C)  SpO2: 94% 95%   Skin clean, dry and intact without evidence of skin break down, no evidence of skin tears noted. IV catheter discontinued intact. Site without signs and symptoms of complications - no redness or edema noted at insertion site, patient denies c/o pain - only slight tenderness at site.  Dressing with slight pressure applied.  D/c Instructions-Education: Discharge instructions given to [facility] staff over the phone they verbalized underst anding. Paper copy sent with EMS. D/c education completed with patient/family including follow up instructions, medication list, d/c activities limitations if indicated, with other d/c instructions as indicated by MD - patient able to verbalize understanding,  all questions fully answered. Patient instructed to return to ED, call 911, or call MD for any changes in condition.  Patient escorted via [place], and D/C home via private auto.  Hassan Rowan, RN 02/08/2019 11:50 AM

## 2019-02-08 NOTE — Progress Notes (Signed)
Occupational Therapy Treatment Patient Details Name: [name] MRN: [id number] DOB: [birth year] Today's Date: 02/08/2019    History of present illness Pt is a 73 y.o. female admitted 01/21/19 with confusion, incontinence and chest pain. Worked up for sepsis with hypotension and metabolic encephalopathy secondary to staph epidermidis bacteremia and ESBL Proteus UTI. Suspected mild dementia. PMH includes CAD, CHF, DM2, HTN, HLD; recent hospitalizaiton s1/9-1/14/20.   OT comments  Pt progressing toward established goals. Pt currently requires maxA for UB dressing and setupA for feeding and grooming while seated upright in bed. Pt continues to demonstrate cognitive limitations and functional limitations listed below (see OT problem list) impacting her independence and safety with ADL and functional mobility. Pt will continue to benefit from skilled OT services. All additional OT needs to be addressed by next venue of care. Pt appropriate to d/c when medically stable. OT to sign off.     Follow Up Recommendations  SNF    Equipment Recommendations       Recommendations for Other Services      Precautions / Restrictions Precautions Precautions: Fall       Mobility Bed Mobility Overal bed mobility: Needs Assistance       Supine to sit: Total assist     General bed mobility comments: totalA to sit upright in bed to adjust gown  Transfers                      Balance                                           ADL either performed or assessed with clinical judgement   ADL Overall ADL's : Needs assistance/impaired Eating/Feeding: Set up;Sitting Eating/Feeding Details (indicate cue type and reason): pt drank drink while sitting upright in bed after setup             Upper Body Dressing : Maximal assistance;Sitting Upper Body Dressing Details (indicate cue type and reason): pt required maxA to don/doff gowns Lower Body Dressing: Total  assistance                       Vision       Perception     Praxis      Cognition Arousal/Alertness: Awake/alert Behavior During Therapy: Anxious Overall Cognitive Status: No family/caregiver present to determine baseline cognitive functioning Area of Impairment: Attention;Following commands;Memory                   Current Attention Level: Selective Memory: Decreased short-term memory Following Commands: Follows one step commands inconsistently     Problem Solving: Decreased initiation;Requires verbal cues;Requires tactile cues;Slow processing;Difficulty sequencing General Comments: tangential speech throughout and needing max cuing to attend to task        Exercises     Shoulder Instructions       General Comments educated and demonstrated general UE exercises;pt verbalized and return demonstrated understanding    Pertinent Vitals/ Pain       Pain Assessment: No/denies pain  Home Living                                          Prior Functioning/Environment  Frequency  Min 2X/week        Progress Toward Goals  OT Goals(current goals can now be found in the care plan section)  Progress towards OT goals: Progressing toward goals  Acute Rehab OT Goals Patient Stated Goal: to go home and see her neighbor OT Goal Formulation: With patient Time For Goal Achievement: 02/22/19 Potential to Achieve Goals: Good ADL Goals Pt Will Perform Grooming: with mod assist;bed level Pt Will Perform Upper Body Bathing: with mod assist;sitting;bed level Pt Will Perform Lower Body Bathing: with mod assist;sitting/lateral leans Additional ADL Goal #1: Pt will complete bed mobility at mod A +2 to prepare for EOB ADLs.  Plan Discharge plan remains appropriate;Other (comment)(all further need can be addressed by next venue of care)    Co-evaluation                 AM-PAC OT "6 Clicks" Daily Activity     Outcome  Measure   Help from another person eating meals?: None Help from another person taking care of personal grooming?: None Help from another person toileting, which includes using toliet, bedpan, or urinal?: Total Help from another person bathing (including washing, rinsing, drying)?: Total Help from another person to put on and taking off regular upper body clothing?: A Lot Help from another person to put on and taking off regular lower body clothing?: Total 6 Click Score: 13    End of Session    OT Visit Diagnosis: Unsteadiness on feet (R26.81);Muscle weakness (generalized) (M62.81);Pain;Other symptoms and signs involving cognitive function;Cognitive communication deficit (R41.841)   Activity Tolerance Patient tolerated treatment well   Patient Left in bed;with call bell/phone within reach;with bed alarm set   Nurse Communication Mobility status        Time: 8264-1583 OT Time Calculation (min): 28 min  Charges: OT General Charges $OT Visit: 1 Visit OT Treatments $Self Care/Home Management : 23-37 mins  [place] Office: [place] 02/08/2019, 3:23 PM

## 2019-02-08 NOTE — Clinical Social Work Placement (Signed)
   CLINICAL SOCIAL WORK PLACEMENT  NOTE  Date:  02/08/2019  Patient Details  Name: [name] MRN: [id number] Date of Birth: 05/17/1946  Clinical Social Work is seeking post-discharge placement for this patient at the [place] level of care (*CSW will initial, date and re-position this form in  chart as items are completed):  Yes   Patient/family provided with [facility] Work Department's list of facilities offering this level of care within the geographic area requested by the patient (or if unable, by the patient's family).  Yes   Patient/family informed of their freedom to choose among providers that offer the needed level of care, that participate in Medicare, Medicaid or managed care program needed by the patient, have an available bed and are willing to accept the patient.  Yes   Patient/family informed of [facility]'s ownership interest in [facility] and [facility], as well as of the fact that they are under no obligation to receive care at these facilities.  PASRR submitted to EDS on       PASRR number received on       Existing PASRR number confirmed on 02/08/19     FL2 transmitted to all facilities in geographic area requested by pt/family on 02/08/19     FL2 transmitted to all facilities within larger geographic area on       Patient informed that his/her managed care company has contracts with or will negotiate with certain facilities, including the following:        Yes   Patient/family informed of bed offers received.  Patient chooses bed at [facility]     Physician recommends and patient chooses bed at      Patient to be transferred to [facility] on 02/08/19.  Patient to be transferred to facility by PTAR     Patient family notified on 02/08/19 of transfer.  Name of family member notified:  Dee Hedricks, Friend (no family present)     PHYSICIAN       Additional Comment:     _______________________________________________ Benard Halsted, LCSW 02/08/2019, 9:39 AM

## 2020-12-11 DIAGNOSIS — I2510 Atherosclerotic heart disease of native coronary artery without angina pectoris: Secondary | ICD-10-CM | POA: Diagnosis not present

## 2020-12-11 DIAGNOSIS — Z029 Encounter for administrative examinations, unspecified: Secondary | ICD-10-CM | POA: Diagnosis not present

## 2021-01-11 DIAGNOSIS — I2510 Atherosclerotic heart disease of native coronary artery without angina pectoris: Secondary | ICD-10-CM | POA: Diagnosis not present

## 2021-01-11 DIAGNOSIS — I5032 Chronic diastolic (congestive) heart failure: Secondary | ICD-10-CM | POA: Diagnosis not present

## 2021-01-11 DIAGNOSIS — I471 Supraventricular tachycardia: Secondary | ICD-10-CM | POA: Diagnosis not present

## 2021-01-11 DIAGNOSIS — I10 Essential (primary) hypertension: Secondary | ICD-10-CM | POA: Diagnosis not present

## 2021-01-13 DIAGNOSIS — Z8679 Personal history of other diseases of the circulatory system: Secondary | ICD-10-CM | POA: Diagnosis not present

## 2021-01-13 DIAGNOSIS — Z79899 Other long term (current) drug therapy: Secondary | ICD-10-CM | POA: Diagnosis not present

## 2021-01-13 DIAGNOSIS — R935 Abnormal findings on diagnostic imaging of other abdominal regions, including retroperitoneum: Secondary | ICD-10-CM | POA: Diagnosis not present

## 2021-01-13 DIAGNOSIS — I10 Essential (primary) hypertension: Secondary | ICD-10-CM | POA: Diagnosis not present

## 2021-01-13 DIAGNOSIS — E0800 Diabetes mellitus due to underlying condition with hyperosmolarity without nonketotic hyperglycemic-hyperosmolar coma (NKHHC): Secondary | ICD-10-CM | POA: Diagnosis not present

## 2021-01-13 DIAGNOSIS — I714 Abdominal aortic aneurysm, without rupture: Secondary | ICD-10-CM | POA: Diagnosis not present

## 2021-01-15 DIAGNOSIS — I714 Abdominal aortic aneurysm, without rupture: Secondary | ICD-10-CM | POA: Diagnosis not present

## 2021-01-18 DIAGNOSIS — E440 Moderate protein-calorie malnutrition: Secondary | ICD-10-CM | POA: Diagnosis not present

## 2021-01-18 DIAGNOSIS — E538 Deficiency of other specified B group vitamins: Secondary | ICD-10-CM | POA: Diagnosis not present

## 2021-01-28 DIAGNOSIS — I259 Chronic ischemic heart disease, unspecified: Secondary | ICD-10-CM | POA: Diagnosis not present

## 2021-01-28 DIAGNOSIS — M6281 Muscle weakness (generalized): Secondary | ICD-10-CM | POA: Diagnosis not present

## 2021-01-28 DIAGNOSIS — E1165 Type 2 diabetes mellitus with hyperglycemia: Secondary | ICD-10-CM | POA: Diagnosis not present

## 2021-01-28 DIAGNOSIS — I5032 Chronic diastolic (congestive) heart failure: Secondary | ICD-10-CM | POA: Diagnosis not present

## 2021-01-29 DIAGNOSIS — M6281 Muscle weakness (generalized): Secondary | ICD-10-CM | POA: Diagnosis not present

## 2021-01-29 DIAGNOSIS — E1165 Type 2 diabetes mellitus with hyperglycemia: Secondary | ICD-10-CM | POA: Diagnosis not present

## 2021-01-29 DIAGNOSIS — I259 Chronic ischemic heart disease, unspecified: Secondary | ICD-10-CM | POA: Diagnosis not present

## 2021-01-29 DIAGNOSIS — I5032 Chronic diastolic (congestive) heart failure: Secondary | ICD-10-CM | POA: Diagnosis not present

## 2021-02-01 DIAGNOSIS — I259 Chronic ischemic heart disease, unspecified: Secondary | ICD-10-CM | POA: Diagnosis not present

## 2021-02-01 DIAGNOSIS — I5032 Chronic diastolic (congestive) heart failure: Secondary | ICD-10-CM | POA: Diagnosis not present

## 2021-02-01 DIAGNOSIS — E1165 Type 2 diabetes mellitus with hyperglycemia: Secondary | ICD-10-CM | POA: Diagnosis not present

## 2021-02-01 DIAGNOSIS — M6281 Muscle weakness (generalized): Secondary | ICD-10-CM | POA: Diagnosis not present

## 2021-02-02 DIAGNOSIS — E1165 Type 2 diabetes mellitus with hyperglycemia: Secondary | ICD-10-CM | POA: Diagnosis not present

## 2021-02-02 DIAGNOSIS — I259 Chronic ischemic heart disease, unspecified: Secondary | ICD-10-CM | POA: Diagnosis not present

## 2021-02-02 DIAGNOSIS — I5032 Chronic diastolic (congestive) heart failure: Secondary | ICD-10-CM | POA: Diagnosis not present

## 2021-02-02 DIAGNOSIS — M6281 Muscle weakness (generalized): Secondary | ICD-10-CM | POA: Diagnosis not present

## 2021-02-03 DIAGNOSIS — I259 Chronic ischemic heart disease, unspecified: Secondary | ICD-10-CM | POA: Diagnosis not present

## 2021-02-03 DIAGNOSIS — I5032 Chronic diastolic (congestive) heart failure: Secondary | ICD-10-CM | POA: Diagnosis not present

## 2021-02-03 DIAGNOSIS — E1165 Type 2 diabetes mellitus with hyperglycemia: Secondary | ICD-10-CM | POA: Diagnosis not present

## 2021-02-03 DIAGNOSIS — M6281 Muscle weakness (generalized): Secondary | ICD-10-CM | POA: Diagnosis not present

## 2021-02-04 DIAGNOSIS — M6281 Muscle weakness (generalized): Secondary | ICD-10-CM | POA: Diagnosis not present

## 2021-02-04 DIAGNOSIS — E1165 Type 2 diabetes mellitus with hyperglycemia: Secondary | ICD-10-CM | POA: Diagnosis not present

## 2021-02-04 DIAGNOSIS — I5032 Chronic diastolic (congestive) heart failure: Secondary | ICD-10-CM | POA: Diagnosis not present

## 2021-02-04 DIAGNOSIS — I259 Chronic ischemic heart disease, unspecified: Secondary | ICD-10-CM | POA: Diagnosis not present

## 2021-02-05 DIAGNOSIS — I259 Chronic ischemic heart disease, unspecified: Secondary | ICD-10-CM | POA: Diagnosis not present

## 2021-02-05 DIAGNOSIS — E1165 Type 2 diabetes mellitus with hyperglycemia: Secondary | ICD-10-CM | POA: Diagnosis not present

## 2021-02-05 DIAGNOSIS — M6281 Muscle weakness (generalized): Secondary | ICD-10-CM | POA: Diagnosis not present

## 2021-02-05 DIAGNOSIS — I5032 Chronic diastolic (congestive) heart failure: Secondary | ICD-10-CM | POA: Diagnosis not present

## 2021-02-08 DIAGNOSIS — M6281 Muscle weakness (generalized): Secondary | ICD-10-CM | POA: Diagnosis not present

## 2021-02-08 DIAGNOSIS — E1165 Type 2 diabetes mellitus with hyperglycemia: Secondary | ICD-10-CM | POA: Diagnosis not present

## 2021-02-08 DIAGNOSIS — I259 Chronic ischemic heart disease, unspecified: Secondary | ICD-10-CM | POA: Diagnosis not present

## 2021-02-08 DIAGNOSIS — I5032 Chronic diastolic (congestive) heart failure: Secondary | ICD-10-CM | POA: Diagnosis not present

## 2021-02-09 DIAGNOSIS — I259 Chronic ischemic heart disease, unspecified: Secondary | ICD-10-CM | POA: Diagnosis not present

## 2021-02-09 DIAGNOSIS — M6281 Muscle weakness (generalized): Secondary | ICD-10-CM | POA: Diagnosis not present

## 2021-02-09 DIAGNOSIS — E1165 Type 2 diabetes mellitus with hyperglycemia: Secondary | ICD-10-CM | POA: Diagnosis not present

## 2021-02-09 DIAGNOSIS — I5032 Chronic diastolic (congestive) heart failure: Secondary | ICD-10-CM | POA: Diagnosis not present

## 2021-02-10 DIAGNOSIS — I5032 Chronic diastolic (congestive) heart failure: Secondary | ICD-10-CM | POA: Diagnosis not present

## 2021-02-10 DIAGNOSIS — I259 Chronic ischemic heart disease, unspecified: Secondary | ICD-10-CM | POA: Diagnosis not present

## 2021-02-10 DIAGNOSIS — E1165 Type 2 diabetes mellitus with hyperglycemia: Secondary | ICD-10-CM | POA: Diagnosis not present

## 2021-02-10 DIAGNOSIS — M6281 Muscle weakness (generalized): Secondary | ICD-10-CM | POA: Diagnosis not present

## 2021-02-12 DIAGNOSIS — M545 Low back pain, unspecified: Secondary | ICD-10-CM | POA: Diagnosis not present

## 2021-02-15 DIAGNOSIS — E1165 Type 2 diabetes mellitus with hyperglycemia: Secondary | ICD-10-CM | POA: Diagnosis not present

## 2021-02-15 DIAGNOSIS — I259 Chronic ischemic heart disease, unspecified: Secondary | ICD-10-CM | POA: Diagnosis not present

## 2021-02-15 DIAGNOSIS — M6281 Muscle weakness (generalized): Secondary | ICD-10-CM | POA: Diagnosis not present

## 2021-02-15 DIAGNOSIS — I5032 Chronic diastolic (congestive) heart failure: Secondary | ICD-10-CM | POA: Diagnosis not present

## 2021-02-17 DIAGNOSIS — I259 Chronic ischemic heart disease, unspecified: Secondary | ICD-10-CM | POA: Diagnosis not present

## 2021-02-17 DIAGNOSIS — M6281 Muscle weakness (generalized): Secondary | ICD-10-CM | POA: Diagnosis not present

## 2021-02-17 DIAGNOSIS — I5032 Chronic diastolic (congestive) heart failure: Secondary | ICD-10-CM | POA: Diagnosis not present

## 2021-02-17 DIAGNOSIS — E1165 Type 2 diabetes mellitus with hyperglycemia: Secondary | ICD-10-CM | POA: Diagnosis not present

## 2021-02-18 DIAGNOSIS — M6281 Muscle weakness (generalized): Secondary | ICD-10-CM | POA: Diagnosis not present

## 2021-02-18 DIAGNOSIS — I5032 Chronic diastolic (congestive) heart failure: Secondary | ICD-10-CM | POA: Diagnosis not present

## 2021-02-18 DIAGNOSIS — I259 Chronic ischemic heart disease, unspecified: Secondary | ICD-10-CM | POA: Diagnosis not present

## 2021-02-18 DIAGNOSIS — E1165 Type 2 diabetes mellitus with hyperglycemia: Secondary | ICD-10-CM | POA: Diagnosis not present

## 2021-02-22 DIAGNOSIS — M6281 Muscle weakness (generalized): Secondary | ICD-10-CM | POA: Diagnosis not present

## 2021-02-22 DIAGNOSIS — I259 Chronic ischemic heart disease, unspecified: Secondary | ICD-10-CM | POA: Diagnosis not present

## 2021-02-22 DIAGNOSIS — E1165 Type 2 diabetes mellitus with hyperglycemia: Secondary | ICD-10-CM | POA: Diagnosis not present

## 2021-02-22 DIAGNOSIS — I5032 Chronic diastolic (congestive) heart failure: Secondary | ICD-10-CM | POA: Diagnosis not present

## 2021-02-24 DIAGNOSIS — M6281 Muscle weakness (generalized): Secondary | ICD-10-CM | POA: Diagnosis not present

## 2021-02-24 DIAGNOSIS — I259 Chronic ischemic heart disease, unspecified: Secondary | ICD-10-CM | POA: Diagnosis not present

## 2021-02-24 DIAGNOSIS — I5032 Chronic diastolic (congestive) heart failure: Secondary | ICD-10-CM | POA: Diagnosis not present

## 2021-02-24 DIAGNOSIS — E1165 Type 2 diabetes mellitus with hyperglycemia: Secondary | ICD-10-CM | POA: Diagnosis not present

## 2021-02-25 DIAGNOSIS — E1165 Type 2 diabetes mellitus with hyperglycemia: Secondary | ICD-10-CM | POA: Diagnosis not present

## 2021-02-25 DIAGNOSIS — I5032 Chronic diastolic (congestive) heart failure: Secondary | ICD-10-CM | POA: Diagnosis not present

## 2021-02-25 DIAGNOSIS — M6281 Muscle weakness (generalized): Secondary | ICD-10-CM | POA: Diagnosis not present

## 2021-02-25 DIAGNOSIS — I259 Chronic ischemic heart disease, unspecified: Secondary | ICD-10-CM | POA: Diagnosis not present

## 2021-03-02 DIAGNOSIS — I259 Chronic ischemic heart disease, unspecified: Secondary | ICD-10-CM | POA: Diagnosis not present

## 2021-03-02 DIAGNOSIS — I5032 Chronic diastolic (congestive) heart failure: Secondary | ICD-10-CM | POA: Diagnosis not present

## 2021-03-02 DIAGNOSIS — M6281 Muscle weakness (generalized): Secondary | ICD-10-CM | POA: Diagnosis not present

## 2021-03-02 DIAGNOSIS — E1165 Type 2 diabetes mellitus with hyperglycemia: Secondary | ICD-10-CM | POA: Diagnosis not present

## 2021-03-03 DIAGNOSIS — I5032 Chronic diastolic (congestive) heart failure: Secondary | ICD-10-CM | POA: Diagnosis not present

## 2021-03-03 DIAGNOSIS — I259 Chronic ischemic heart disease, unspecified: Secondary | ICD-10-CM | POA: Diagnosis not present

## 2021-03-03 DIAGNOSIS — M6281 Muscle weakness (generalized): Secondary | ICD-10-CM | POA: Diagnosis not present

## 2021-03-03 DIAGNOSIS — E1165 Type 2 diabetes mellitus with hyperglycemia: Secondary | ICD-10-CM | POA: Diagnosis not present

## 2021-03-04 DIAGNOSIS — I5032 Chronic diastolic (congestive) heart failure: Secondary | ICD-10-CM | POA: Diagnosis not present

## 2021-03-04 DIAGNOSIS — M6281 Muscle weakness (generalized): Secondary | ICD-10-CM | POA: Diagnosis not present

## 2021-03-04 DIAGNOSIS — I259 Chronic ischemic heart disease, unspecified: Secondary | ICD-10-CM | POA: Diagnosis not present

## 2021-03-04 DIAGNOSIS — E1165 Type 2 diabetes mellitus with hyperglycemia: Secondary | ICD-10-CM | POA: Diagnosis not present

## 2021-03-08 DIAGNOSIS — I2510 Atherosclerotic heart disease of native coronary artery without angina pectoris: Secondary | ICD-10-CM | POA: Diagnosis not present

## 2021-03-08 DIAGNOSIS — E118 Type 2 diabetes mellitus with unspecified complications: Secondary | ICD-10-CM | POA: Diagnosis not present

## 2021-03-08 DIAGNOSIS — R5381 Other malaise: Secondary | ICD-10-CM | POA: Diagnosis not present

## 2021-03-08 DIAGNOSIS — I5032 Chronic diastolic (congestive) heart failure: Secondary | ICD-10-CM | POA: Diagnosis not present

## 2021-03-09 DIAGNOSIS — I259 Chronic ischemic heart disease, unspecified: Secondary | ICD-10-CM | POA: Diagnosis not present

## 2021-03-09 DIAGNOSIS — M6281 Muscle weakness (generalized): Secondary | ICD-10-CM | POA: Diagnosis not present

## 2021-03-09 DIAGNOSIS — I5032 Chronic diastolic (congestive) heart failure: Secondary | ICD-10-CM | POA: Diagnosis not present

## 2021-03-09 DIAGNOSIS — E1165 Type 2 diabetes mellitus with hyperglycemia: Secondary | ICD-10-CM | POA: Diagnosis not present

## 2021-03-10 DIAGNOSIS — M6281 Muscle weakness (generalized): Secondary | ICD-10-CM | POA: Diagnosis not present

## 2021-03-10 DIAGNOSIS — I5032 Chronic diastolic (congestive) heart failure: Secondary | ICD-10-CM | POA: Diagnosis not present

## 2021-03-10 DIAGNOSIS — E0800 Diabetes mellitus due to underlying condition with hyperosmolarity without nonketotic hyperglycemic-hyperosmolar coma (NKHHC): Secondary | ICD-10-CM | POA: Diagnosis not present

## 2021-03-10 DIAGNOSIS — E1165 Type 2 diabetes mellitus with hyperglycemia: Secondary | ICD-10-CM | POA: Diagnosis not present

## 2021-03-10 DIAGNOSIS — I259 Chronic ischemic heart disease, unspecified: Secondary | ICD-10-CM | POA: Diagnosis not present

## 2021-03-11 DIAGNOSIS — E1165 Type 2 diabetes mellitus with hyperglycemia: Secondary | ICD-10-CM | POA: Diagnosis not present

## 2021-03-11 DIAGNOSIS — I5032 Chronic diastolic (congestive) heart failure: Secondary | ICD-10-CM | POA: Diagnosis not present

## 2021-03-11 DIAGNOSIS — I259 Chronic ischemic heart disease, unspecified: Secondary | ICD-10-CM | POA: Diagnosis not present

## 2021-03-11 DIAGNOSIS — M6281 Muscle weakness (generalized): Secondary | ICD-10-CM | POA: Diagnosis not present

## 2021-03-15 DIAGNOSIS — I10 Essential (primary) hypertension: Secondary | ICD-10-CM | POA: Diagnosis not present

## 2021-03-15 DIAGNOSIS — E1165 Type 2 diabetes mellitus with hyperglycemia: Secondary | ICD-10-CM | POA: Diagnosis not present

## 2021-03-15 DIAGNOSIS — E7849 Other hyperlipidemia: Secondary | ICD-10-CM | POA: Diagnosis not present

## 2021-03-15 DIAGNOSIS — I259 Chronic ischemic heart disease, unspecified: Secondary | ICD-10-CM | POA: Diagnosis not present

## 2021-03-15 DIAGNOSIS — I2510 Atherosclerotic heart disease of native coronary artery without angina pectoris: Secondary | ICD-10-CM | POA: Diagnosis not present

## 2021-03-15 DIAGNOSIS — M6281 Muscle weakness (generalized): Secondary | ICD-10-CM | POA: Diagnosis not present

## 2021-03-15 DIAGNOSIS — I5032 Chronic diastolic (congestive) heart failure: Secondary | ICD-10-CM | POA: Diagnosis not present

## 2021-03-16 DIAGNOSIS — I259 Chronic ischemic heart disease, unspecified: Secondary | ICD-10-CM | POA: Diagnosis not present

## 2021-03-16 DIAGNOSIS — I5032 Chronic diastolic (congestive) heart failure: Secondary | ICD-10-CM | POA: Diagnosis not present

## 2021-03-16 DIAGNOSIS — M6281 Muscle weakness (generalized): Secondary | ICD-10-CM | POA: Diagnosis not present

## 2021-03-16 DIAGNOSIS — E1165 Type 2 diabetes mellitus with hyperglycemia: Secondary | ICD-10-CM | POA: Diagnosis not present

## 2021-04-05 DIAGNOSIS — R262 Difficulty in walking, not elsewhere classified: Secondary | ICD-10-CM | POA: Diagnosis not present

## 2021-04-05 DIAGNOSIS — Z029 Encounter for administrative examinations, unspecified: Secondary | ICD-10-CM | POA: Diagnosis not present

## 2021-04-05 DIAGNOSIS — G3184 Mild cognitive impairment, so stated: Secondary | ICD-10-CM | POA: Diagnosis not present

## 2021-04-22 DIAGNOSIS — M6281 Muscle weakness (generalized): Secondary | ICD-10-CM | POA: Diagnosis not present

## 2021-04-22 DIAGNOSIS — I5032 Chronic diastolic (congestive) heart failure: Secondary | ICD-10-CM | POA: Diagnosis not present

## 2021-04-22 DIAGNOSIS — R293 Abnormal posture: Secondary | ICD-10-CM | POA: Diagnosis not present

## 2021-04-28 DIAGNOSIS — I2510 Atherosclerotic heart disease of native coronary artery without angina pectoris: Secondary | ICD-10-CM | POA: Diagnosis not present

## 2021-04-28 DIAGNOSIS — I152 Hypertension secondary to endocrine disorders: Secondary | ICD-10-CM | POA: Diagnosis not present

## 2021-04-28 DIAGNOSIS — E1159 Type 2 diabetes mellitus with other circulatory complications: Secondary | ICD-10-CM | POA: Diagnosis not present

## 2021-04-28 DIAGNOSIS — I714 Abdominal aortic aneurysm, without rupture: Secondary | ICD-10-CM | POA: Diagnosis not present

## 2021-07-12 DIAGNOSIS — E1159 Type 2 diabetes mellitus with other circulatory complications: Secondary | ICD-10-CM | POA: Diagnosis not present

## 2021-07-12 DIAGNOSIS — I10 Essential (primary) hypertension: Secondary | ICD-10-CM | POA: Diagnosis not present

## 2021-07-12 DIAGNOSIS — I5032 Chronic diastolic (congestive) heart failure: Secondary | ICD-10-CM | POA: Diagnosis not present

## 2021-07-12 DIAGNOSIS — R5381 Other malaise: Secondary | ICD-10-CM | POA: Diagnosis not present

## 2021-07-12 DIAGNOSIS — I2510 Atherosclerotic heart disease of native coronary artery without angina pectoris: Secondary | ICD-10-CM | POA: Diagnosis not present

## 2021-07-14 DIAGNOSIS — E559 Vitamin D deficiency, unspecified: Secondary | ICD-10-CM | POA: Diagnosis not present

## 2021-07-14 DIAGNOSIS — I10 Essential (primary) hypertension: Secondary | ICD-10-CM | POA: Diagnosis not present

## 2021-07-14 DIAGNOSIS — I119 Hypertensive heart disease without heart failure: Secondary | ICD-10-CM | POA: Diagnosis not present

## 2021-07-14 DIAGNOSIS — E0800 Diabetes mellitus due to underlying condition with hyperosmolarity without nonketotic hyperglycemic-hyperosmolar coma (NKHHC): Secondary | ICD-10-CM | POA: Diagnosis not present

## 2021-07-14 DIAGNOSIS — D511 Vitamin B12 deficiency anemia due to selective vitamin B12 malabsorption with proteinuria: Secondary | ICD-10-CM | POA: Diagnosis not present

## 2021-07-15 DIAGNOSIS — R293 Abnormal posture: Secondary | ICD-10-CM | POA: Diagnosis not present

## 2021-07-15 DIAGNOSIS — I5032 Chronic diastolic (congestive) heart failure: Secondary | ICD-10-CM | POA: Diagnosis not present

## 2021-07-15 DIAGNOSIS — M6281 Muscle weakness (generalized): Secondary | ICD-10-CM | POA: Diagnosis not present

## 2021-07-16 DIAGNOSIS — R293 Abnormal posture: Secondary | ICD-10-CM | POA: Diagnosis not present

## 2021-07-16 DIAGNOSIS — M6281 Muscle weakness (generalized): Secondary | ICD-10-CM | POA: Diagnosis not present

## 2021-07-16 DIAGNOSIS — I5032 Chronic diastolic (congestive) heart failure: Secondary | ICD-10-CM | POA: Diagnosis not present

## 2021-07-19 DIAGNOSIS — R293 Abnormal posture: Secondary | ICD-10-CM | POA: Diagnosis not present

## 2021-07-19 DIAGNOSIS — M6281 Muscle weakness (generalized): Secondary | ICD-10-CM | POA: Diagnosis not present

## 2021-07-19 DIAGNOSIS — I5032 Chronic diastolic (congestive) heart failure: Secondary | ICD-10-CM | POA: Diagnosis not present

## 2021-07-20 DIAGNOSIS — I5032 Chronic diastolic (congestive) heart failure: Secondary | ICD-10-CM | POA: Diagnosis not present

## 2021-07-20 DIAGNOSIS — R293 Abnormal posture: Secondary | ICD-10-CM | POA: Diagnosis not present

## 2021-07-20 DIAGNOSIS — M6281 Muscle weakness (generalized): Secondary | ICD-10-CM | POA: Diagnosis not present

## 2021-07-21 DIAGNOSIS — I5032 Chronic diastolic (congestive) heart failure: Secondary | ICD-10-CM | POA: Diagnosis not present

## 2021-07-21 DIAGNOSIS — R293 Abnormal posture: Secondary | ICD-10-CM | POA: Diagnosis not present

## 2021-07-21 DIAGNOSIS — M6281 Muscle weakness (generalized): Secondary | ICD-10-CM | POA: Diagnosis not present

## 2021-07-22 DIAGNOSIS — M6281 Muscle weakness (generalized): Secondary | ICD-10-CM | POA: Diagnosis not present

## 2021-07-22 DIAGNOSIS — R293 Abnormal posture: Secondary | ICD-10-CM | POA: Diagnosis not present

## 2021-07-22 DIAGNOSIS — I5032 Chronic diastolic (congestive) heart failure: Secondary | ICD-10-CM | POA: Diagnosis not present

## 2021-07-23 DIAGNOSIS — R293 Abnormal posture: Secondary | ICD-10-CM | POA: Diagnosis not present

## 2021-07-23 DIAGNOSIS — I5032 Chronic diastolic (congestive) heart failure: Secondary | ICD-10-CM | POA: Diagnosis not present

## 2021-07-23 DIAGNOSIS — M6281 Muscle weakness (generalized): Secondary | ICD-10-CM | POA: Diagnosis not present

## 2021-07-26 DIAGNOSIS — M6281 Muscle weakness (generalized): Secondary | ICD-10-CM | POA: Diagnosis not present

## 2021-07-26 DIAGNOSIS — I5032 Chronic diastolic (congestive) heart failure: Secondary | ICD-10-CM | POA: Diagnosis not present

## 2021-07-26 DIAGNOSIS — R293 Abnormal posture: Secondary | ICD-10-CM | POA: Diagnosis not present

## 2021-07-27 DIAGNOSIS — R293 Abnormal posture: Secondary | ICD-10-CM | POA: Diagnosis not present

## 2021-07-27 DIAGNOSIS — I5032 Chronic diastolic (congestive) heart failure: Secondary | ICD-10-CM | POA: Diagnosis not present

## 2021-07-27 DIAGNOSIS — M6281 Muscle weakness (generalized): Secondary | ICD-10-CM | POA: Diagnosis not present

## 2021-07-28 DIAGNOSIS — M6281 Muscle weakness (generalized): Secondary | ICD-10-CM | POA: Diagnosis not present

## 2021-07-28 DIAGNOSIS — R293 Abnormal posture: Secondary | ICD-10-CM | POA: Diagnosis not present

## 2021-07-28 DIAGNOSIS — I5032 Chronic diastolic (congestive) heart failure: Secondary | ICD-10-CM | POA: Diagnosis not present

## 2021-07-29 DIAGNOSIS — M6281 Muscle weakness (generalized): Secondary | ICD-10-CM | POA: Diagnosis not present

## 2021-07-29 DIAGNOSIS — I5032 Chronic diastolic (congestive) heart failure: Secondary | ICD-10-CM | POA: Diagnosis not present

## 2021-07-29 DIAGNOSIS — Z993 Dependence on wheelchair: Secondary | ICD-10-CM | POA: Diagnosis not present

## 2021-07-30 DIAGNOSIS — M6281 Muscle weakness (generalized): Secondary | ICD-10-CM | POA: Diagnosis not present

## 2021-07-30 DIAGNOSIS — Z993 Dependence on wheelchair: Secondary | ICD-10-CM | POA: Diagnosis not present

## 2021-07-30 DIAGNOSIS — I5032 Chronic diastolic (congestive) heart failure: Secondary | ICD-10-CM | POA: Diagnosis not present

## 2021-08-02 DIAGNOSIS — I5032 Chronic diastolic (congestive) heart failure: Secondary | ICD-10-CM | POA: Diagnosis not present

## 2021-08-02 DIAGNOSIS — M6281 Muscle weakness (generalized): Secondary | ICD-10-CM | POA: Diagnosis not present

## 2021-08-02 DIAGNOSIS — Z993 Dependence on wheelchair: Secondary | ICD-10-CM | POA: Diagnosis not present

## 2021-08-03 DIAGNOSIS — I5032 Chronic diastolic (congestive) heart failure: Secondary | ICD-10-CM | POA: Diagnosis not present

## 2021-08-03 DIAGNOSIS — M6281 Muscle weakness (generalized): Secondary | ICD-10-CM | POA: Diagnosis not present

## 2021-08-03 DIAGNOSIS — Z993 Dependence on wheelchair: Secondary | ICD-10-CM | POA: Diagnosis not present

## 2021-08-04 DIAGNOSIS — Z993 Dependence on wheelchair: Secondary | ICD-10-CM | POA: Diagnosis not present

## 2021-08-04 DIAGNOSIS — M6281 Muscle weakness (generalized): Secondary | ICD-10-CM | POA: Diagnosis not present

## 2021-08-04 DIAGNOSIS — I5032 Chronic diastolic (congestive) heart failure: Secondary | ICD-10-CM | POA: Diagnosis not present

## 2021-08-05 DIAGNOSIS — M6281 Muscle weakness (generalized): Secondary | ICD-10-CM | POA: Diagnosis not present

## 2021-08-05 DIAGNOSIS — I5032 Chronic diastolic (congestive) heart failure: Secondary | ICD-10-CM | POA: Diagnosis not present

## 2021-08-05 DIAGNOSIS — Z993 Dependence on wheelchair: Secondary | ICD-10-CM | POA: Diagnosis not present

## 2021-08-06 DIAGNOSIS — Z993 Dependence on wheelchair: Secondary | ICD-10-CM | POA: Diagnosis not present

## 2021-08-06 DIAGNOSIS — M6281 Muscle weakness (generalized): Secondary | ICD-10-CM | POA: Diagnosis not present

## 2021-08-06 DIAGNOSIS — I5032 Chronic diastolic (congestive) heart failure: Secondary | ICD-10-CM | POA: Diagnosis not present

## 2021-08-09 DIAGNOSIS — Z993 Dependence on wheelchair: Secondary | ICD-10-CM | POA: Diagnosis not present

## 2021-08-09 DIAGNOSIS — M6281 Muscle weakness (generalized): Secondary | ICD-10-CM | POA: Diagnosis not present

## 2021-08-09 DIAGNOSIS — I5032 Chronic diastolic (congestive) heart failure: Secondary | ICD-10-CM | POA: Diagnosis not present

## 2021-08-10 DIAGNOSIS — Z993 Dependence on wheelchair: Secondary | ICD-10-CM | POA: Diagnosis not present

## 2021-08-10 DIAGNOSIS — I5032 Chronic diastolic (congestive) heart failure: Secondary | ICD-10-CM | POA: Diagnosis not present

## 2021-08-10 DIAGNOSIS — M6281 Muscle weakness (generalized): Secondary | ICD-10-CM | POA: Diagnosis not present

## 2021-08-11 DIAGNOSIS — Z993 Dependence on wheelchair: Secondary | ICD-10-CM | POA: Diagnosis not present

## 2021-08-11 DIAGNOSIS — M6281 Muscle weakness (generalized): Secondary | ICD-10-CM | POA: Diagnosis not present

## 2021-08-11 DIAGNOSIS — I5032 Chronic diastolic (congestive) heart failure: Secondary | ICD-10-CM | POA: Diagnosis not present

## 2021-08-12 DIAGNOSIS — Z993 Dependence on wheelchair: Secondary | ICD-10-CM | POA: Diagnosis not present

## 2021-08-12 DIAGNOSIS — M6281 Muscle weakness (generalized): Secondary | ICD-10-CM | POA: Diagnosis not present

## 2021-08-12 DIAGNOSIS — I5032 Chronic diastolic (congestive) heart failure: Secondary | ICD-10-CM | POA: Diagnosis not present

## 2021-08-13 DIAGNOSIS — Z993 Dependence on wheelchair: Secondary | ICD-10-CM | POA: Diagnosis not present

## 2021-08-13 DIAGNOSIS — I5032 Chronic diastolic (congestive) heart failure: Secondary | ICD-10-CM | POA: Diagnosis not present

## 2021-08-13 DIAGNOSIS — M6281 Muscle weakness (generalized): Secondary | ICD-10-CM | POA: Diagnosis not present

## 2021-08-16 DIAGNOSIS — M6281 Muscle weakness (generalized): Secondary | ICD-10-CM | POA: Diagnosis not present

## 2021-08-16 DIAGNOSIS — I5032 Chronic diastolic (congestive) heart failure: Secondary | ICD-10-CM | POA: Diagnosis not present

## 2021-08-16 DIAGNOSIS — Z993 Dependence on wheelchair: Secondary | ICD-10-CM | POA: Diagnosis not present

## 2021-08-17 DIAGNOSIS — M6281 Muscle weakness (generalized): Secondary | ICD-10-CM | POA: Diagnosis not present

## 2021-08-17 DIAGNOSIS — Z993 Dependence on wheelchair: Secondary | ICD-10-CM | POA: Diagnosis not present

## 2021-08-17 DIAGNOSIS — I5032 Chronic diastolic (congestive) heart failure: Secondary | ICD-10-CM | POA: Diagnosis not present

## 2021-08-18 DIAGNOSIS — M6281 Muscle weakness (generalized): Secondary | ICD-10-CM | POA: Diagnosis not present

## 2021-08-18 DIAGNOSIS — I5032 Chronic diastolic (congestive) heart failure: Secondary | ICD-10-CM | POA: Diagnosis not present

## 2021-08-18 DIAGNOSIS — Z993 Dependence on wheelchair: Secondary | ICD-10-CM | POA: Diagnosis not present

## 2021-08-19 DIAGNOSIS — Z993 Dependence on wheelchair: Secondary | ICD-10-CM | POA: Diagnosis not present

## 2021-08-19 DIAGNOSIS — M6281 Muscle weakness (generalized): Secondary | ICD-10-CM | POA: Diagnosis not present

## 2021-08-19 DIAGNOSIS — I5032 Chronic diastolic (congestive) heart failure: Secondary | ICD-10-CM | POA: Diagnosis not present

## 2021-08-20 DIAGNOSIS — Z993 Dependence on wheelchair: Secondary | ICD-10-CM | POA: Diagnosis not present

## 2021-08-20 DIAGNOSIS — I5032 Chronic diastolic (congestive) heart failure: Secondary | ICD-10-CM | POA: Diagnosis not present

## 2021-08-20 DIAGNOSIS — M6281 Muscle weakness (generalized): Secondary | ICD-10-CM | POA: Diagnosis not present

## 2021-08-23 DIAGNOSIS — M6281 Muscle weakness (generalized): Secondary | ICD-10-CM | POA: Diagnosis not present

## 2021-08-23 DIAGNOSIS — I5032 Chronic diastolic (congestive) heart failure: Secondary | ICD-10-CM | POA: Diagnosis not present

## 2021-08-23 DIAGNOSIS — Z993 Dependence on wheelchair: Secondary | ICD-10-CM | POA: Diagnosis not present

## 2021-08-24 DIAGNOSIS — M6281 Muscle weakness (generalized): Secondary | ICD-10-CM | POA: Diagnosis not present

## 2021-08-24 DIAGNOSIS — Z993 Dependence on wheelchair: Secondary | ICD-10-CM | POA: Diagnosis not present

## 2021-08-24 DIAGNOSIS — I5032 Chronic diastolic (congestive) heart failure: Secondary | ICD-10-CM | POA: Diagnosis not present

## 2021-08-25 DIAGNOSIS — Z993 Dependence on wheelchair: Secondary | ICD-10-CM | POA: Diagnosis not present

## 2021-08-25 DIAGNOSIS — I5032 Chronic diastolic (congestive) heart failure: Secondary | ICD-10-CM | POA: Diagnosis not present

## 2021-08-25 DIAGNOSIS — M6281 Muscle weakness (generalized): Secondary | ICD-10-CM | POA: Diagnosis not present

## 2021-08-26 DIAGNOSIS — Z993 Dependence on wheelchair: Secondary | ICD-10-CM | POA: Diagnosis not present

## 2021-08-26 DIAGNOSIS — I5032 Chronic diastolic (congestive) heart failure: Secondary | ICD-10-CM | POA: Diagnosis not present

## 2021-08-26 DIAGNOSIS — M6281 Muscle weakness (generalized): Secondary | ICD-10-CM | POA: Diagnosis not present

## 2021-08-27 DIAGNOSIS — M6281 Muscle weakness (generalized): Secondary | ICD-10-CM | POA: Diagnosis not present

## 2021-08-27 DIAGNOSIS — Z993 Dependence on wheelchair: Secondary | ICD-10-CM | POA: Diagnosis not present

## 2021-08-27 DIAGNOSIS — I5032 Chronic diastolic (congestive) heart failure: Secondary | ICD-10-CM | POA: Diagnosis not present

## 2021-08-30 DIAGNOSIS — I2510 Atherosclerotic heart disease of native coronary artery without angina pectoris: Secondary | ICD-10-CM | POA: Diagnosis not present

## 2021-08-30 DIAGNOSIS — I714 Abdominal aortic aneurysm, without rupture, unspecified: Secondary | ICD-10-CM | POA: Diagnosis not present

## 2021-08-30 DIAGNOSIS — R6889 Other general symptoms and signs: Secondary | ICD-10-CM | POA: Diagnosis not present

## 2021-08-30 DIAGNOSIS — R5381 Other malaise: Secondary | ICD-10-CM | POA: Diagnosis not present

## 2021-08-30 DIAGNOSIS — I5032 Chronic diastolic (congestive) heart failure: Secondary | ICD-10-CM | POA: Diagnosis not present

## 2021-08-30 DIAGNOSIS — I152 Hypertension secondary to endocrine disorders: Secondary | ICD-10-CM | POA: Diagnosis not present

## 2021-08-30 DIAGNOSIS — E1169 Type 2 diabetes mellitus with other specified complication: Secondary | ICD-10-CM | POA: Diagnosis not present

## 2021-08-30 DIAGNOSIS — E1159 Type 2 diabetes mellitus with other circulatory complications: Secondary | ICD-10-CM | POA: Diagnosis not present

## 2021-08-30 DIAGNOSIS — Z789 Other specified health status: Secondary | ICD-10-CM | POA: Diagnosis not present

## 2021-09-15 ENCOUNTER — Ambulatory Visit: Payer: Medicare Other | Admitting: Podiatry

## 2021-11-08 DIAGNOSIS — I5032 Chronic diastolic (congestive) heart failure: Secondary | ICD-10-CM | POA: Diagnosis not present

## 2021-11-08 DIAGNOSIS — E1159 Type 2 diabetes mellitus with other circulatory complications: Secondary | ICD-10-CM | POA: Diagnosis not present

## 2021-11-08 DIAGNOSIS — I152 Hypertension secondary to endocrine disorders: Secondary | ICD-10-CM | POA: Diagnosis not present

## 2021-11-08 DIAGNOSIS — I2510 Atherosclerotic heart disease of native coronary artery without angina pectoris: Secondary | ICD-10-CM | POA: Diagnosis not present

## 2022-06-28 ENCOUNTER — Other Ambulatory Visit: Payer: Self-pay

## 2022-06-28 ENCOUNTER — Encounter (HOSPITAL_COMMUNITY): Payer: Self-pay | Admitting: Emergency Medicine

## 2022-06-28 ENCOUNTER — Emergency Department (HOSPITAL_COMMUNITY): Payer: Medicare Other

## 2022-06-28 ENCOUNTER — Emergency Department (HOSPITAL_COMMUNITY)
Admission: EM | Admit: 2022-06-28 | Discharge: 2022-06-28 | Disposition: A | Discharge status: Skilled Nursing Facility | Payer: Medicare Other | Attending: Emergency Medicine | Admitting: Emergency Medicine

## 2022-06-28 DIAGNOSIS — N390 Urinary tract infection, site not specified: Secondary | ICD-10-CM | POA: Diagnosis not present

## 2022-06-28 DIAGNOSIS — R4182 Altered mental status, unspecified: Secondary | ICD-10-CM | POA: Insufficient documentation

## 2022-06-28 LAB — COMPREHENSIVE METABOLIC PANEL
ALT: 15 U/L (ref 0–44)
AST: 16 U/L (ref 15–41)
Albumin: 3.3 g/dL — ABNORMAL LOW (ref 3.5–5.0)
Alkaline Phosphatase: 63 U/L (ref 38–126)
Anion gap: 10 (ref 5–15)
BUN: 17 mg/dL (ref 8–23)
CO2: 25 mmol/L (ref 22–32)
Calcium: 9.1 mg/dL (ref 8.9–10.3)
Chloride: 103 mmol/L (ref 98–111)
Creatinine, Ser: 0.62 mg/dL (ref 0.44–1.00)
GFR, Estimated: >60 mL/min (ref >60)
Glucose, Bld: 127 mg/dL — ABNORMAL HIGH (ref 70–99)
Potassium: 3.6 mmol/L (ref 3.5–5.1)
Sodium: 138 mmol/L (ref 135–145)
Total Bilirubin: 0.5 mg/dL (ref 0.3–1.2)
Total Protein: 7.5 g/dL (ref 6.5–8.1)

## 2022-06-28 LAB — URINALYSIS, ROUTINE W REFLEX MICROSCOPIC
Bilirubin Urine: NEGATIVE
Glucose, UA: NEGATIVE mg/dL
Ketones, ur: NEGATIVE mg/dL
Nitrite: POSITIVE — AB
Protein, ur: 100 mg/dL — AB
Specific Gravity, Urine: 1.009 (ref 1.005–1.030)
WBC, UA: >50 WBC/hpf — ABNORMAL HIGH (ref 0–5)
pH: 8 (ref 5.0–8.0)

## 2022-06-28 LAB — CBC WITH DIFFERENTIAL/PLATELET
Abs Immature Granulocytes: 0.04 10*3/uL (ref 0.00–0.07)
Basophils Absolute: 0.1 10*3/uL (ref 0.0–0.1)
Basophils Relative: 1 %
Eosinophils Absolute: 0.2 10*3/uL (ref 0.0–0.5)
Eosinophils Relative: 2 %
HCT: 48.4 % — ABNORMAL HIGH (ref 36.0–46.0)
Hemoglobin: 15.9 g/dL — ABNORMAL HIGH (ref 12.0–15.0)
Immature Granulocytes: 0 %
Lymphocytes Relative: 31 %
Lymphs Abs: 3.2 10*3/uL (ref 0.7–4.0)
MCH: 29.9 pg (ref 26.0–34.0)
MCHC: 32.9 g/dL (ref 30.0–36.0)
MCV: 91.1 fL (ref 80.0–100.0)
Monocytes Absolute: 0.8 10*3/uL (ref 0.1–1.0)
Monocytes Relative: 7 %
Neutro Abs: 6.2 10*3/uL (ref 1.7–7.7)
Neutrophils Relative %: 59 %
Platelets: 210 10*3/uL (ref 150–400)
RBC: 5.31 MIL/uL — ABNORMAL HIGH (ref 3.87–5.11)
RDW: 12.7 % (ref 11.5–15.5)
WBC: 10.5 10*3/uL (ref 4.0–10.5)
nRBC: 0 % (ref 0.0–0.2)

## 2022-06-28 LAB — LACTIC ACID, PLASMA: Lactic Acid, Venous: 1.6 mmol/L (ref 0.5–1.9)

## 2022-06-28 LAB — PROTIME-INR
INR: 1 (ref 0.8–1.2)
Prothrombin Time: 13.5 seconds (ref 11.4–15.2)

## 2022-06-28 MED ORDER — SODIUM CHLORIDE 0.9 % IV SOLN
1.0000 g | Freq: Once | INTRAVENOUS | Status: AC
  Administered 2022-06-28: 1 g via INTRAVENOUS
  Filled 2022-06-28: qty 10

## 2022-06-28 MED ORDER — CEPHALEXIN 250 MG PO CAPS: 250.0000 mg | ORAL_CAPSULE | Freq: Four times a day (QID) | ORAL | 0 refills | Status: AC
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
## 2022-06-28 NOTE — ED Notes (Signed)
Patient refuses vitals signs.
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
## 2022-07-02 LAB — URINE CULTURE: Culture: >=100000 — AB

## 2022-07-03 ENCOUNTER — Telehealth (HOSPITAL_BASED_OUTPATIENT_CLINIC_OR_DEPARTMENT_OTHER): Payer: Self-pay | Admitting: *Deleted

## 2022-07-03 LAB — CULTURE, BLOOD (ROUTINE X 2)
Culture: NO GROWTH
Culture: NO GROWTH
Special Requests: ADEQUATE

## 2022-07-03 NOTE — Telephone Encounter (Signed)
Post ED Visit - Positive Culture Follow-up: Successful Patient Follow-Up  Culture assessed and recommendations reviewed by:  '[x]'$  Alyson Ingles, Pharm.D. '[]'$  Heide Guile, Pharm.D., BCPS AQ-ID '[]'$  Parks Neptune, Pharm.D., BCPS '[]'$  Alycia Rossetti, Pharm.D., BCPS '[]'$  [place], [place].D., BCPS, AAHIVP '[]'$  Legrand Como, Pharm.D., BCPS, AAHIVP '[]'$  Salome Arnt, PharmD, BCPS '[]'$  Johnnette Gourd, PharmD, BCPS '[]'$  Hughes Better, PharmD, BCPS '[]'$  Leeroy Cha, PharmD  Positive urine culture  '[]'$  Patient discharged without antimicrobial prescription and treatment is now indicated '[x]'$  Organism is resistant to prescribed ED discharge antimicrobial '[]'$  Patient with positive blood cultures  Changes discussed with ED provider: Astrid Drafts, PA New antibiotic prescription Bactrim DS Faxed to [facility] and [facility] [phone]  [facility], date 07/03/22, time 0946   Rosie Fate 07/03/2022, 9:47 AM

## 2022-07-03 NOTE — Progress Notes (Signed)
ED Antimicrobial Stewardship Positive Culture Follow Up   [name] is an 76 y.o. female who presented to [facility] on 06/28/2022 with a chief complaint of  Chief Complaint  Patient presents with   Altered Mental Status    Recent Results (from the past 720 hour(s))  Culture, blood (Routine x 2)     Status: None (Preliminary result)   Collection Time: 06/28/22  4:40 PM   Specimen: BLOOD  Result Value Ref Range Status   Specimen Description   Final    BLOOD BLOOD RIGHT HAND Performed at [facility], [place] [address] [place], [place] [address]    Special Requests   Final    BOTTLES DRAWN AEROBIC AND ANAEROBIC Blood Culture results may not be optimal due to an inadequate volume of blood received in culture bottles Performed at [facility] [address], [place], [place] [address]    Culture   Final    NO GROWTH 4 DAYS Performed at [facility], [place] [address], [place], [place] [address]    Report Status PENDING  Incomplete  Culture, blood (Routine x 2)     Status: None (Preliminary result)   Collection Time: 06/28/22  4:40 PM   Specimen: BLOOD  Result Value Ref Range Status   Specimen Description   Final    BLOOD BLOOD LEFT HAND Performed at [facility] [address], [place], [place] [address]    Special Requests   Final    BOTTLES DRAWN AEROBIC AND ANAEROBIC Blood Culture adequate volume Performed at [facility] [address] [place], [place] [address]    Culture   Final    NO GROWTH 4 DAYS Performed at [facility], [place] [address], [place], [place] [address]    Report Status PENDING  Incomplete  Urine Culture     Status: Abnormal   Collection Time: 06/28/22  5:14 PM   Specimen: Urine, Catheterized  Result Value Ref Range Status   Specimen Description   Final    URINE, CATHETERIZED Performed at [facility] [address], [place], [place]  [address]    Special Requests   Final    NONE Performed at [facility], [place] [address], [place], [place] [address]    Culture (A)  Final    >=100,000 COLONIES/mL KLEBSIELLA OXYTOCA >=100,000 COLONIES/mL PROVIDENCIA STUARTII >=100,000 COLONIES/mL AEROCOCCUS VIRIDANS Standardized susceptibility testing for this organism is not available. Performed at [facility], [place] [address] [place], [place] [address]    Report Status 07/02/2022 FINAL  Final   Organism ID, Bacteria KLEBSIELLA OXYTOCA (A)  Final   Organism ID, Bacteria PROVIDENCIA STUARTII (A)  Final      Susceptibility   Klebsiella oxytoca - MIC*    AMPICILLIN RESISTANT Resistant     CEFAZOLIN <=4 SENSITIVE Sensitive     CEFEPIME <=0.12 SENSITIVE Sensitive     CEFTRIAXONE <=0.25 SENSITIVE Sensitive     CIPROFLOXACIN <=0.25 SENSITIVE Sensitive     GENTAMICIN <=1 SENSITIVE Sensitive     IMIPENEM <=0.25 SENSITIVE Sensitive     NITROFURANTOIN <=16 SENSITIVE Sensitive     TRIMETH/SULFA <=20 SENSITIVE Sensitive     AMPICILLIN/SULBACTAM 4 SENSITIVE Sensitive     PIP/TAZO <=4 SENSITIVE Sensitive     * >=100,000 COLONIES/mL KLEBSIELLA OXYTOCA   Providencia stuartii - MIC*    AMPICILLIN >=32 RESISTANT Resistant     CEFAZOLIN >=64 RESISTANT Resistant  CEFEPIME <=0.12 SENSITIVE Sensitive     CEFTRIAXONE 32 RESISTANT Resistant     CIPROFLOXACIN >=4 RESISTANT Resistant     GENTAMICIN 8 INTERMEDIATE Intermediate     IMIPENEM 4 SENSITIVE Sensitive     NITROFURANTOIN 128 RESISTANT Resistant     TRIMETH/SULFA <=20 SENSITIVE Sensitive     AMPICILLIN/SULBACTAM >=32 RESISTANT Resistant     PIP/TAZO <=4 SENSITIVE Sensitive     * >=100,000 COLONIES/mL PROVIDENCIA STUARTII    '[x]'$  Treated with cephalexin, organism resistant to prescribed antimicrobial '[]'$  Patient discharged originally without antimicrobial agent and treatment is now indicated  New antibiotic prescription: Bactrim DS 1 tab BID x 3 days. If NH is able,  please repeat BMET and urinalysis after treatment is complete.  ED Provider: Astrid Drafts, NP   Emiliano Dyer 07/03/2022, 9:03 AM Clinical Pharmacist [phone]

## 2022-08-04 NOTE — Progress Notes (Signed)
Regarding the PT order on 06/28/22, for Coding Query-- it was ordered by Harlene Ramus, RN as a standing order, before I saw the patient. I do not see a reason that it should have been ordered. I did not use it in my Medical Decision Making for this encounter.  Daleen Bo, M.D.

## 2022-11-15 ENCOUNTER — Emergency Department (HOSPITAL_COMMUNITY): Payer: Medicare Other

## 2022-11-15 ENCOUNTER — Inpatient Hospital Stay (HOSPITAL_COMMUNITY)
Admission: EM | Admit: 2022-11-15 | Discharge: 2022-11-28 | DRG: 871 | Disposition: E | Payer: Medicare Other | Attending: Pulmonary Disease | Admitting: Pulmonary Disease

## 2022-11-15 ENCOUNTER — Encounter (HOSPITAL_COMMUNITY): Payer: Self-pay | Admitting: Emergency Medicine

## 2022-11-15 DIAGNOSIS — Z87440 Personal history of urinary (tract) infections: Secondary | ICD-10-CM

## 2022-11-15 DIAGNOSIS — R54 Age-related physical debility: Secondary | ICD-10-CM | POA: Diagnosis present

## 2022-11-15 DIAGNOSIS — I110 Hypertensive heart disease with heart failure: Secondary | ICD-10-CM | POA: Diagnosis present

## 2022-11-15 DIAGNOSIS — E7800 Pure hypercholesterolemia, unspecified: Secondary | ICD-10-CM | POA: Diagnosis present

## 2022-11-15 DIAGNOSIS — E876 Hypokalemia: Secondary | ICD-10-CM | POA: Diagnosis present

## 2022-11-15 DIAGNOSIS — N390 Urinary tract infection, site not specified: Secondary | ICD-10-CM | POA: Diagnosis present

## 2022-11-15 DIAGNOSIS — Z8616 Personal history of COVID-19: Secondary | ICD-10-CM

## 2022-11-15 DIAGNOSIS — L03115 Cellulitis of right lower limb: Secondary | ICD-10-CM | POA: Diagnosis present

## 2022-11-15 DIAGNOSIS — I5032 Chronic diastolic (congestive) heart failure: Secondary | ICD-10-CM | POA: Diagnosis present

## 2022-11-15 DIAGNOSIS — R4182 Altered mental status, unspecified: Principal | ICD-10-CM

## 2022-11-15 DIAGNOSIS — I2510 Atherosclerotic heart disease of native coronary artery without angina pectoris: Secondary | ICD-10-CM | POA: Diagnosis present

## 2022-11-15 DIAGNOSIS — E8809 Other disorders of plasma-protein metabolism, not elsewhere classified: Secondary | ICD-10-CM | POA: Diagnosis present

## 2022-11-15 DIAGNOSIS — M549 Dorsalgia, unspecified: Secondary | ICD-10-CM | POA: Diagnosis present

## 2022-11-15 DIAGNOSIS — I890 Lymphedema, not elsewhere classified: Secondary | ICD-10-CM | POA: Diagnosis present

## 2022-11-15 DIAGNOSIS — Z7401 Bed confinement status: Secondary | ICD-10-CM

## 2022-11-15 DIAGNOSIS — M1990 Unspecified osteoarthritis, unspecified site: Secondary | ICD-10-CM | POA: Diagnosis present

## 2022-11-15 DIAGNOSIS — I714 Abdominal aortic aneurysm, without rupture, unspecified: Secondary | ICD-10-CM | POA: Diagnosis present

## 2022-11-15 DIAGNOSIS — R579 Shock, unspecified: Secondary | ICD-10-CM | POA: Diagnosis present

## 2022-11-15 DIAGNOSIS — Z66 Do not resuscitate: Secondary | ICD-10-CM | POA: Diagnosis present

## 2022-11-15 DIAGNOSIS — G9341 Metabolic encephalopathy: Secondary | ICD-10-CM | POA: Diagnosis present

## 2022-11-15 DIAGNOSIS — A419 Sepsis, unspecified organism: Principal | ICD-10-CM | POA: Diagnosis present

## 2022-11-15 DIAGNOSIS — E669 Obesity, unspecified: Secondary | ICD-10-CM | POA: Diagnosis present

## 2022-11-15 DIAGNOSIS — E042 Nontoxic multinodular goiter: Secondary | ICD-10-CM | POA: Diagnosis present

## 2022-11-15 DIAGNOSIS — D649 Anemia, unspecified: Secondary | ICD-10-CM | POA: Diagnosis present

## 2022-11-15 DIAGNOSIS — R6521 Severe sepsis with septic shock: Secondary | ICD-10-CM | POA: Diagnosis present

## 2022-11-15 DIAGNOSIS — Z22322 Carrier or suspected carrier of Methicillin resistant Staphylococcus aureus: Secondary | ICD-10-CM

## 2022-11-15 DIAGNOSIS — Z6824 Body mass index (BMI) 24.0-24.9, adult: Secondary | ICD-10-CM

## 2022-11-15 DIAGNOSIS — E11319 Type 2 diabetes mellitus with unspecified diabetic retinopathy without macular edema: Secondary | ICD-10-CM | POA: Diagnosis present

## 2022-11-15 DIAGNOSIS — I252 Old myocardial infarction: Secondary | ICD-10-CM

## 2022-11-15 DIAGNOSIS — E1165 Type 2 diabetes mellitus with hyperglycemia: Secondary | ICD-10-CM | POA: Diagnosis not present

## 2022-11-15 DIAGNOSIS — Z515 Encounter for palliative care: Secondary | ICD-10-CM | POA: Diagnosis not present

## 2022-11-15 DIAGNOSIS — Z885 Allergy status to narcotic agent status: Secondary | ICD-10-CM

## 2022-11-15 DIAGNOSIS — Z7984 Long term (current) use of oral hypoglycemic drugs: Secondary | ICD-10-CM

## 2022-11-15 DIAGNOSIS — Z9049 Acquired absence of other specified parts of digestive tract: Secondary | ICD-10-CM

## 2022-11-15 DIAGNOSIS — I712 Thoracic aortic aneurysm, without rupture, unspecified: Secondary | ICD-10-CM | POA: Diagnosis present

## 2022-11-15 DIAGNOSIS — Z7189 Other specified counseling: Secondary | ICD-10-CM | POA: Diagnosis not present

## 2022-11-15 DIAGNOSIS — F064 Anxiety disorder due to known physiological condition: Secondary | ICD-10-CM | POA: Diagnosis present

## 2022-11-15 DIAGNOSIS — Z79899 Other long term (current) drug therapy: Secondary | ICD-10-CM

## 2022-11-15 DIAGNOSIS — L03116 Cellulitis of left lower limb: Secondary | ICD-10-CM | POA: Diagnosis present

## 2022-11-15 DIAGNOSIS — N179 Acute kidney failure, unspecified: Secondary | ICD-10-CM | POA: Diagnosis present

## 2022-11-15 DIAGNOSIS — Z961 Presence of intraocular lens: Secondary | ICD-10-CM | POA: Diagnosis present

## 2022-11-15 DIAGNOSIS — Z8249 Family history of ischemic heart disease and other diseases of the circulatory system: Secondary | ICD-10-CM

## 2022-11-15 DIAGNOSIS — I959 Hypotension, unspecified: Secondary | ICD-10-CM | POA: Diagnosis present

## 2022-11-15 DIAGNOSIS — F411 Generalized anxiety disorder: Secondary | ICD-10-CM | POA: Diagnosis present

## 2022-11-15 DIAGNOSIS — Z955 Presence of coronary angioplasty implant and graft: Secondary | ICD-10-CM

## 2022-11-15 LAB — COMPREHENSIVE METABOLIC PANEL
ALT: 13 U/L (ref 0–44)
AST: 22 U/L (ref 15–41)
Albumin: 2.3 g/dL — ABNORMAL LOW (ref 3.5–5.0)
Alkaline Phosphatase: 59 U/L (ref 38–126)
Anion gap: 29 — ABNORMAL HIGH (ref 5–15)
BUN: 17 mg/dL (ref 8–23)
CO2: 11 mmol/L — ABNORMAL LOW (ref 22–32)
Calcium: 9.2 mg/dL (ref 8.9–10.3)
Chloride: 106 mmol/L (ref 98–111)
Creatinine, Ser: 1.5 mg/dL — ABNORMAL HIGH (ref 0.44–1.00)
GFR, Estimated: 35 mL/min — ABNORMAL LOW (ref >60)
Glucose, Bld: 153 mg/dL — ABNORMAL HIGH (ref 70–99)
Potassium: 3.8 mmol/L (ref 3.5–5.1)
Sodium: 146 mmol/L — ABNORMAL HIGH (ref 135–145)
Total Bilirubin: 1.5 mg/dL — ABNORMAL HIGH (ref 0.3–1.2)
Total Protein: 5.8 g/dL — ABNORMAL LOW (ref 6.5–8.1)

## 2022-11-15 LAB — CBC WITH DIFFERENTIAL/PLATELET
Abs Immature Granulocytes: 0.17 10*3/uL — ABNORMAL HIGH (ref 0.00–0.07)
Basophils Absolute: 0.1 10*3/uL (ref 0.0–0.1)
Basophils Relative: 0 %
Eosinophils Absolute: 0 10*3/uL (ref 0.0–0.5)
Eosinophils Relative: 0 %
HCT: 43 % (ref 36.0–46.0)
Hemoglobin: 13.4 g/dL (ref 12.0–15.0)
Immature Granulocytes: 1 %
Lymphocytes Relative: 5 %
Lymphs Abs: 0.9 10*3/uL (ref 0.7–4.0)
MCH: 32.4 pg (ref 26.0–34.0)
MCHC: 31.2 g/dL (ref 30.0–36.0)
MCV: 103.9 fL — ABNORMAL HIGH (ref 80.0–100.0)
Monocytes Absolute: 1.8 10*3/uL — ABNORMAL HIGH (ref 0.1–1.0)
Monocytes Relative: 11 %
Neutro Abs: 13.6 10*3/uL — ABNORMAL HIGH (ref 1.7–7.7)
Neutrophils Relative %: 83 %
Platelets: 247 10*3/uL (ref 150–400)
RBC: 4.14 MIL/uL (ref 3.87–5.11)
RDW: 16.5 % — ABNORMAL HIGH (ref 11.5–15.5)
WBC: 16.5 10*3/uL — ABNORMAL HIGH (ref 4.0–10.5)
nRBC: 0 % (ref 0.0–0.2)

## 2022-11-15 LAB — PROCALCITONIN: Procalcitonin: 0.27 ng/mL

## 2022-11-15 LAB — GLUCOSE, CAPILLARY
Glucose-Capillary: 135 mg/dL — ABNORMAL HIGH (ref 70–99)
Glucose-Capillary: 149 mg/dL — ABNORMAL HIGH (ref 70–99)
Glucose-Capillary: 171 mg/dL — ABNORMAL HIGH (ref 70–99)
Glucose-Capillary: 159 mg/dL — ABNORMAL HIGH (ref 70–99)
Glucose-Capillary: 152 mg/dL — ABNORMAL HIGH (ref 70–99)

## 2022-11-15 LAB — PROTIME-INR
INR: 1.2 (ref 0.8–1.2)
Prothrombin Time: 14.7 seconds (ref 11.4–15.2)

## 2022-11-15 LAB — RESP PANEL BY RT-PCR (RSV, FLU A&B, COVID)  RVPGX2
Influenza A by PCR: NEGATIVE
Influenza B by PCR: NEGATIVE
Resp Syncytial Virus by PCR: NEGATIVE
SARS Coronavirus 2 by RT PCR: POSITIVE — AB

## 2022-11-15 LAB — APTT: aPTT: 29 seconds (ref 24–36)

## 2022-11-15 LAB — URINALYSIS, ROUTINE W REFLEX MICROSCOPIC
Bilirubin Urine: NEGATIVE
Glucose, UA: NEGATIVE mg/dL
Ketones, ur: 20 mg/dL — AB
Nitrite: NEGATIVE
Protein, ur: 100 mg/dL — AB
RBC / HPF: >50 RBC/hpf — ABNORMAL HIGH (ref 0–5)
Specific Gravity, Urine: 1.012 (ref 1.005–1.030)
Squamous Epithelial / HPF: >50 — ABNORMAL HIGH (ref 0–5)
WBC, UA: >50 WBC/hpf — ABNORMAL HIGH (ref 0–5)
pH: 5 (ref 5.0–8.0)

## 2022-11-15 LAB — LACTIC ACID, PLASMA
Lactic Acid, Venous: 4.6 mmol/L (ref 0.5–1.9)
Lactic Acid, Venous: 5.5 mmol/L (ref 0.5–1.9)
Lactic Acid, Venous: 3.1 mmol/L (ref 0.5–1.9)

## 2022-11-15 LAB — BRAIN NATRIURETIC PEPTIDE: B Natriuretic Peptide: 221.5 pg/mL — ABNORMAL HIGH (ref 0.0–100.0)

## 2022-11-15 MED ORDER — DOCUSATE SODIUM 100 MG PO CAPS: 100.0000 mg | ORAL_CAPSULE | Freq: Two times a day (BID) | ORAL | Status: DC | PRN

## 2022-11-15 MED ORDER — LACTATED RINGERS IV BOLUS (SEPSIS)
1000.0000 mL | Freq: Once | INTRAVENOUS | Status: AC
  Administered 2022-11-15: 1000 mL via INTRAVENOUS

## 2022-11-15 MED ORDER — SODIUM CHLORIDE 0.9 % IV SOLN: INTRAVENOUS | Status: DC

## 2022-11-15 MED ORDER — METRONIDAZOLE 500 MG/100ML IV SOLN
500.0000 mg | Freq: Once | INTRAVENOUS | Status: AC
  Administered 2022-11-15: 500 mg via INTRAVENOUS
  Filled 2022-11-15: qty 100

## 2022-11-15 MED ORDER — POLYETHYLENE GLYCOL 3350 17 G PO PACK: 17.0000 g | PACK | Freq: Every day | ORAL | Status: DC | PRN

## 2022-11-15 MED ORDER — VANCOMYCIN HCL IN DEXTROSE 1-5 GM/200ML-% IV SOLN: 1000.0000 mg | Freq: Once | INTRAVENOUS | Status: DC

## 2022-11-15 MED ORDER — HEPARIN SODIUM (PORCINE) 5000 UNIT/ML IJ SOLN
5000.0000 [IU] | Freq: Three times a day (TID) | INTRAMUSCULAR | Status: DC
  Administered 2022-11-15 – 2022-11-18 (×9): 5000 [IU] via SUBCUTANEOUS
  Filled 2022-11-15 (×9): qty 1

## 2022-11-15 MED ORDER — LACTATED RINGERS IV SOLN: INTRAVENOUS | Status: AC

## 2022-11-15 MED ORDER — LACTATED RINGERS IV BOLUS (SEPSIS): 1000.0000 mL | Freq: Once | INTRAVENOUS | Status: DC

## 2022-11-15 MED ORDER — PIPERACILLIN-TAZOBACTAM 3.375 G IVPB
3.3750 g | Freq: Three times a day (TID) | INTRAVENOUS | Status: DC
  Administered 2022-11-15 – 2022-11-16 (×3): 3.375 g via INTRAVENOUS
  Filled 2022-11-15 (×4): qty 50

## 2022-11-15 MED ORDER — NOREPINEPHRINE 4 MG/250ML-% IV SOLN
2.0000 ug/min | INTRAVENOUS | Status: DC
  Administered 2022-11-15: 4 ug/min via INTRAVENOUS
  Administered 2022-11-16: 7 ug/min via INTRAVENOUS
  Administered 2022-11-16 (×2): 9 ug/min via INTRAVENOUS
  Administered 2022-11-17: 4 ug/min via INTRAVENOUS
  Filled 2022-11-15 (×6): qty 250

## 2022-11-15 MED ORDER — PHENYLEPHRINE HCL-NACL 20-0.9 MG/250ML-% IV SOLN: 25.0000 ug/min | INTRAVENOUS | Status: DC

## 2022-11-15 MED ORDER — CHLORHEXIDINE GLUCONATE CLOTH 2 % EX PADS
6.0000 | MEDICATED_PAD | Freq: Every day | CUTANEOUS | Status: DC
  Administered 2022-11-15 – 2022-11-18 (×4): 6 via TOPICAL

## 2022-11-15 MED ORDER — SODIUM CHLORIDE 0.9 % IV SOLN: 250.0000 mL | INTRAVENOUS | Status: DC

## 2022-11-15 MED ORDER — VANCOMYCIN HCL 1500 MG/300ML IV SOLN
1500.0000 mg | Freq: Once | INTRAVENOUS | Status: AC
  Administered 2022-11-15: 1500 mg via INTRAVENOUS
  Filled 2022-11-15: qty 300

## 2022-11-15 MED ORDER — LACTATED RINGERS IV BOLUS (SEPSIS): 500.0000 mL | Freq: Once | INTRAVENOUS | Status: DC

## 2022-11-15 MED ORDER — SODIUM CHLORIDE 0.9 % IV SOLN
2.0000 g | Freq: Once | INTRAVENOUS | Status: AC
  Administered 2022-11-15: 2 g via INTRAVENOUS
  Filled 2022-11-15: qty 12.5

## 2022-11-15 MED ORDER — VANCOMYCIN HCL 1500 MG/300ML IV SOLN: 1500.0000 mg | INTRAVENOUS | Status: DC
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
## 2022-11-15 NOTE — ED Provider Notes (Signed)
[facility] EMERGENCY DEPARTMENT Provider Note   CSN: [id number] Arrival date & time: 11/19/2022  8756     History  Chief Complaint  Patient presents with   Altered Mental Status    [name] is a 76 y.o. female with a past medical history significant for hypertension, CAD status post PCI 2014, SVT, type 2 diabetes, CHF to the ED due to altered mental status.  Discussed with Levada Dy at [facility] and rehabilitation center who notes patient was in her normal state of health last night prior to going to bed.  Around 2:30 AM patient appeared lethargic.  At that time, living facility staff felt like patient was having shortness of breath and gave a nebulizer treatment.  Chest x-ray was ordered, unclear what is showed. At 4:10AM patient was unresponsive per nursing staff and EMS was called. Discussed with patient's friend, [name], who notes patient recently tested positive for COVID (11/23) and has not been the same since. No documented fever at living facility.  Patient unable to provide history at bedside due to altered mental status.  Level 5 caveat.     Home Medications Prior to Admission medications   Medication Sig Start Date End Date Taking? Authorizing Provider  acetaminophen (TYLENOL) 325 MG tablet Take 650 mg by mouth every 6 (six) hours as needed for mild pain.     [provider]  ARIPiprazole (ABILIFY) 5 MG tablet Take 5 mg by mouth daily. 06/08/22   [provider]  calcium-vitamin D (OSCAL WITH D) 500-200 MG-UNIT tablet Take 1 tablet by mouth daily.     [provider]  cephALEXin (KEFLEX) 250 MG capsule Take 1 capsule (250 mg total) by mouth 4 (four) times daily. 06/28/22   Daleen Bo, MD  Cholecalciferol (VITAMIN D) 50 MCG (2000 UT) CAPS Take 2,000 Units by mouth at bedtime.     [provider]  furosemide (LASIX) 40 MG tablet Take 40 mg by mouth daily.     [provider]  ipratropium-albuterol  (DUONEB) 0.5-2.5 (3) MG/3ML SOLN Take 3 mLs by nebulization every 6 (six) hours as needed (SOB).    [provider]  lidocaine (LIDODERM) 5 % Place 1 patch onto the skin daily. Remove & Discard patch within 12 hours or as directed by MD . Apply over left lateral chest 12/11/18   Dhungel, Nishant, MD  metFORMIN (GLUCOPHAGE) 500 MG tablet Take 500 mg by mouth daily. 05/20/22   [provider]  metoprolol (LOPRESSOR) 50 MG tablet Take 1 tablet (50 mg total) by mouth 2 (two) times daily. PLEASE CONTACT OFFICE FOR ADDITIONAL REFILLS 09/29/16   Minus Breeding, MD  metoprolol succinate (TOPROL-XL) 25 MG 24 hr tablet Take 25 mg by mouth daily. 05/20/22   [provider]  Multiple Vitamin (MULTIVITAMIN WITH MINERALS) TABS tablet Take 1 tablet by mouth daily.     [provider]  nitroGLYCERIN (NITROSTAT) 0.4 MG SL tablet Place 1 tablet (0.4 mg total) under the tongue every 5 (five) minutes as needed for chest pain. 04/16/13   Richardson Dopp T, PA-C  ondansetron (ZOFRAN) 4 MG tablet Take 4 mg by mouth every 8 (eight) hours as needed for nausea or vomiting.    [provider]  simvastatin (ZOCOR) 40 MG tablet Take 40 mg by mouth at bedtime.     [provider]  tamsulosin (FLOMAX) 0.4 MG CAPS capsule Take 1 capsule (0.4 mg total) by mouth daily. 02/08/19   Aline August, MD  vitamin B-12 (CYANOCOBALAMIN) 1000 MCG tablet Take 1,000 mcg by mouth daily.    [provider]      Allergies    Oxycodone    Review of Systems   Review of Systems  Unable to perform ROS: Mental status change    Physical Exam Updated Vital Signs BP (!) 81/57   Pulse (!) 145   Temp 97.8 F (36.6 C)   Resp (!) 26   SpO2 93%  Physical Exam Vitals and nursing note reviewed.  Constitutional:      General: She is in acute distress.     Appearance: She is ill-appearing.  HENT:     Head: Normocephalic.  Eyes:     Pupils: Pupils are equal, round, and reactive to light.   Cardiovascular:     Rate and Rhythm: Regular rhythm. Tachycardia present.     Pulses: Normal pulses.     Heart sounds: Normal heart sounds. No murmur heard.    No friction rub. No gallop.  Pulmonary:     Effort: Pulmonary effort is normal.     Breath sounds: Rhonchi and rales present.  Abdominal:     General: Abdomen is flat. There is no distension.     Palpations: Abdomen is soft.     Tenderness: There is no abdominal tenderness. There is no guarding or rebound.  Musculoskeletal:        General: Normal range of motion.     Cervical back: Neck supple.     Right lower leg: Edema present.     Left lower leg: Edema present.     Comments: 2+ pitting edema bilaterally  Skin:    General: Skin is warm and dry.  Neurological:     General: No focal deficit present.     Mental Status: She is alert.     Comments: Able to follow simple commands. Equal grip strength. Open eyes to voice. Attempts to say name  Psychiatric:        Mood and Affect: Mood normal.        Behavior: Behavior normal.     ED Results / Procedures / Treatments   Labs (all labs ordered are listed, but only abnormal results are displayed) Labs Reviewed  RESP PANEL BY RT-PCR (RSV, FLU A&B, COVID)  RVPGX2 - Abnormal; Notable for the following components:      Result Value   SARS Coronavirus 2 by RT PCR POSITIVE (*)    All other components within normal limits  LACTIC ACID, PLASMA - Abnormal; Notable for the following components:   Lactic Acid, Venous 4.6 (*)    All other components within normal limits  COMPREHENSIVE METABOLIC PANEL - Abnormal; Notable for the following components:   Sodium 146 (*)    CO2 11 (*)    Glucose, Bld 153 (*)    Creatinine, Ser 1.50 (*)    Total Protein 5.8 (*)    Albumin 2.3 (*)    Total Bilirubin 1.5 (*)    GFR, Estimated 35 (*)    Anion gap 29 (*)    All other components within normal limits  CBC WITH DIFFERENTIAL/PLATELET - Abnormal; Notable for the following components:   WBC  16.5 (*)    MCV 103.9 (*)    RDW 16.5 (*)    Neutro Abs 13.6 (*)    Monocytes Absolute 1.8 (*)    Abs Immature Granulocytes 0.17 (*)    All other components within normal limits  BRAIN NATRIURETIC PEPTIDE - Abnormal; Notable for  the following components:   B Natriuretic Peptide 221.5 (*)    All other components within normal limits  CULTURE, BLOOD (ROUTINE X 2)  CULTURE, BLOOD (ROUTINE X 2)  URINE CULTURE  PROTIME-INR  APTT  URINALYSIS, ROUTINE W REFLEX MICROSCOPIC  PROCALCITONIN  STREP PNEUMONIAE URINARY ANTIGEN  LEGIONELLA PNEUMOPHILA SEROGP 1 UR AG    EKG EKG Interpretation  Date/Time:  Tuesday November 15 2022 07:11:12 EST Ventricular Rate:  149 PR Interval:  108 QRS Duration: 93 QT Interval:  335 QTC Calculation: 528 R Axis:   14 Text Interpretation: Supraventricular tachycardia Abnormal R-wave progression, early transition when compraed to prior, similar appearnce. No STEMI Confirmed by Antony Blackbird [phone]) on 10/28/2022 7:18:22 AM  Radiology DG Chest Port 1 View  Result Date: 11/24/2022 CLINICAL DATA:  Provided history: Questionable sepsis-evaluate for abnormality. EXAM: PORTABLE CHEST 1 VIEW COMPARISON:  Prior chest radiographs 06/28/2022 and earlier. FINDINGS: Cardiomegaly. Aortic atherosclerosis. Small left pleural effusion. Ill-defined opacity at the left lung base. No appreciable airspace consolidation on the right. No evidence of pneumothorax. Chronic compression fractures and sequelae of prior vertebral augmentation at multiple thoracic levels. IMPRESSION: 1. Small left pleural effusion. 2. Ill-defined opacity at the left lung base. This may reflect atelectasis. However, pneumonia cannot be excluded. 3. Cardiomegaly. 4. Aortic Atherosclerosis (ICD10-I70.0). Electronically Signed   By: Kellie Simmering D.O.   On: 11/20/2022 08:05    Procedures .Critical Care  Performed by: Suzy Bouchard, PA-C Authorized by: Suzy Bouchard, PA-C   Critical care  provider statement:    Critical care time (minutes):  45   Critical care was necessary to treat or prevent imminent or life-threatening deterioration of the following conditions:  Sepsis and respiratory failure   Critical care was time spent personally by me on the following activities:  Development of treatment plan with patient or surrogate, discussions with consultants, evaluation of patient's response to treatment, examination of patient, ordering and review of laboratory studies, ordering and review of radiographic studies, ordering and performing treatments and interventions, pulse oximetry, re-evaluation of patient's condition and review of old charts   I assumed direction of critical care for this patient from another provider in my specialty: no     Care discussed with: admitting provider       Medications Ordered in ED Medications  lactated ringers infusion (has no administration in time range)  vancomycin (VANCOCIN) IVPB 1000 mg/200 mL premix (has no administration in time range)  lactated ringers bolus 1,000 mL (0 mLs Intravenous Stopped 11/27/2022 0815)    And  lactated ringers bolus 1,000 mL (has no administration in time range)    And  lactated ringers bolus 1,000 mL (1,000 mLs Intravenous New Bag/Given 11/01/2022 0820)    And  lactated ringers bolus 500 mL (has no administration in time range)  0.9 %  sodium chloride infusion (has no administration in time range)  phenylephrine (NEO-SYNEPHRINE) '20mg'$ /NS 278m premix infusion (has no administration in time range)  docusate sodium (COLACE) capsule 100 mg (has no administration in time range)  polyethylene glycol (MIRALAX / GLYCOLAX) packet 17 g (has no administration in time range)  heparin injection 5,000 Units (has no administration in time range)  0.9 %  sodium chloride infusion (has no administration in time range)  0.9 %  sodium chloride infusion (has no administration in time range)  norepinephrine (LEVOPHED) '4mg'$  in 2557m (0.016 mg/mL) premix infusion (6 mcg/min Intravenous Rate/Dose Change 11/01/2022 0920)  ceFEPIme (MAXIPIME) 2 g in sodium chloride 0.9 %  100 mL IVPB (0 g Intravenous Stopped 11/21/2022 0821)  metroNIDAZOLE (FLAGYL) IVPB 500 mg (500 mg Intravenous New Bag/Given 11/23/2022 0825)    ED Course/ Medical Decision Making/ A&P Clinical Course as of 10/29/2022 0931  Tue Nov 15, 2022  0659 Spoke to patient's friend [name] who is on the way from [place]. She notes patient had COVID 2 weeks ago and has not been the same since. Patient has no Healthcare power of attorney. DNR at bedside. Sepsis labs drawn. IVFs and antibiotics started. Patient hypotensive. Will reassess BP after 1 L IVFs to see need for pressors.  [CA]  [id number] Dr. Sherry Ruffing spoke to Richardson Landry Minor, NP with critical care who will evaluate patient at bedside [CA]  0813 WBC(!): 16.5 [CA]  0824 Critical care at bedside. Plan to admit to ICU [CA]    Clinical Course User Index [CA] Suzy Bouchard, PA-C                           Medical Decision Making Amount and/or Complexity of Data Reviewed Independent Historian: friend and EMS    Details: Obtained history from EMS, friend, and living facility External Data Reviewed: notes. Labs: ordered. Decision-making details documented in ED Course. Radiology: ordered and independent interpretation performed. Decision-making details documented in ED Course. ECG/medicine tests: ordered and independent interpretation performed. Decision-making details documented in ED Course.  Risk Prescription drug management. Decision regarding hospitalization.   This patient presents to the ED for concern of AMS, SOB, this involves an extensive number of treatment options, and is a complaint that carries with it a high risk of complications and morbidity.  The differential diagnosis includes pneumonia, CHF exacerbation, PE, UTI, etc  76 year old female presents to the ED due to altered mental status. Hx CHF, CAD, DM, HTN, HL.  Patient lives at [facility] and rehabilitation center and was found to be unresponsive with SOB early this morning.  Normal state of health last night.  Patient is typically bedbound. Had multidrug resistant UTI in August 2023. Recently tested positive for COVID (11/23) and per friend patient has not been the same since.  Upon arrival, patient tachycardic in the 150s, hypotensive, and tachypneic.  O2 saturation above 95% on room air.  Patient appears fluid overloaded on exam.  Lungs with rhonchi and rails.  IV fluids and antibiotics started for suspected sepsis of unknown etiology (pneumonia vs. UTI). Discussed with Dr. Sherry Ruffing who evaluated patient at bedside and agrees with assessment and plan.   Chest x-ray personally reviewed and interpreted which demonstrates possible opacity in the left lung base which could reflect atelectasis versus pneumonia.  Patient already receiving IV antibiotics.  Does also demonstrate cardiomegaly and small left pleural effusion.  CBC significant for leukocytosis at 16.5.  Normal hemoglobin. Lactic acid elevated.   8:16 AM Reassessed patient at bedside. BP slightly improved from initial reading after IVFs. MAP 68. Critical care at bedside.   Critical Care to admit for further treatment.         Final Clinical Impression(s) / ED Diagnoses Final diagnoses:  Altered mental status, unspecified altered mental status type  Sepsis, due to unspecified organism, unspecified whether acute organ dysfunction present [facility])    Rx / DC Orders ED Discharge Orders     None         Karie Kirks 11/16/2022 [id number]    Tegeler, Gwenyth Allegra, MD November 28, 2022 0020

## 2022-11-15 NOTE — Progress Notes (Signed)
Notified provider and bedside nurse of need to order and draw repeat lactic acid.

## 2022-11-15 NOTE — Progress Notes (Signed)
Pharmacy Antibiotic Note  [name] is a 76 y.o. female admitted on 11/11/2022 presenting with AMS and tachycardia, concern 2/2 to UTI with hx of MDR still sens to zosyn.  Pharmacy has been consulted for Zosyn dosing.  Plan: Zosyn 3.375g IV every 8 hours (extended 4h infusion) Monitor renal function, Cx and clinical progression to narrow     Temp (24hrs), Avg:97.6 F (36.4 C), Min:97 F (36.1 C), Max:97.8 F (36.6 C)  Recent Labs  Lab 11/26/2022 0700  WBC 16.5*  CREATININE 1.50*  LATICACIDVEN 4.6*    CrCl cannot be calculated (Unknown ideal weight.).    Allergies  Allergen Reactions   Oxycodone Nausea Only    Bertis Ruddy, PharmD, [facility] Clinical Pharmacist ED Pharmacist Phone # [phone] 11/17/2022 9:52 AM

## 2022-11-15 NOTE — Consult Note (Signed)
NAME:  [name], MRN:  [id number], DOB:  August 06, 1946, LOS: 0 ADMISSION DATE:  11/20/2022, CONSULTATION DATE: 11/11/2022 REFERRING MD: Emergency department physician, CHIEF COMPLAINT: Shock  History of Present Illness:  76 year old female, nursing home resident, bedridden, he was a known DNR and presents with sudden onset of decreased level of consciousness and tachycardia.  She was recently admitted for urinary tract infection with multidrug-resistant bacteria.  She has no healthcare power of attorney.  Pulmonary critical care called to the bedside and she will be admitted to the intensive care unit and treated with antibiotics plus or minus vasopressors and she may need intubation.  Pertinent  Medical History   Past Medical History:  Diagnosis Date   Anxiety    Due to current back pain and has to lie flat.   Arthritis    CAD (coronary artery disease)    a. NSTEMI in setting of UTI and SVT 12/2012 => LHC 01/08/13: Proximal LAD 30%, ostial diagonal 30-40%, proximal RCA 95%, inferior AK, EF 45-50% => PCI: Promus DES x 2 to RCA;  b. Echo 01/05/13: EF 27-25%, grade 1 diastolic dysfunction, MAC    Compression fracture    thoracic 12   Edema    History of blood transfusion    Hypercholesteremia    Hypertension    Myocardial infarction [facility] 2014   Rectal ulceration    colonoscopy 12/2012   SVT (supraventricular tachycardia)    Type II diabetes mellitus ([place])      Significant Hospital Events: Including procedures, antibiotic start and stop dates in addition to other pertinent events     Interim History / Subjective:  Multiple notes from the nursing home and a DNR patient who again presents with most likely sepsis from urinary tract infection but is obviously volume overloaded.  Most likely she will need a central line antimicrobial therapy and admission to the intensive care unit.   Objective   Blood pressure (!) 82/61, pulse (!) 150, temperature (!) 97 F (36.1 C), temperature  source Temporal, resp. rate (!) 28, SpO2 94 %.       No intake or output data in the 24 hours ending 11/22/2022 0814 There were no vitals filed for this visit.  Examination: General: Frail elderly female who is obviously volume overloaded HENT: Positive JVD Lungs: Decreased air movement Cardiovascular: Appears to be a sinus tach 1 5160s Abdomen: Obese soft tender Extremities: Massive anasarca Neuro: Will respond with lip speaking Sinus tach  Resolved Hospital Problem list     Assessment & Plan:  Shock from presumed sepsis most likely from UTI.  She is a DNR.  She is bedridden and nursing home.  She has had multiple episodes of hypotension that responded to antibiotics and fluids in the past.  She appears to be grossly volume overloaded at this point. Antimicrobial therapy will utilize Zosyn due to her history of multiresistant bacteria in the urine Need to pursue full DNR without resuscitation or she will require vasopressor support admission to the ICU and reversal of DNR status.  Type 2 diabetes mellitus CBG (last 3)  No results for input(s): "GLUCAP" in the last 72 hours. Sliding-scale insulin  Congestive heart failure no 2D echo since 2020 showed grade 1 diastolic dysfunction Careful with fluid administration s Best Practice (right click and "Reselect all SmartList Selections" daily)   Diet/type: NPO DVT prophylaxis:  GI prophylaxis: PPI Lines: N/A Foley:  N/A Code Status:  DNR Last date of multidisciplinary goals of care discussion [tbd]  Labs   CBC: Recent Labs  Lab 10/28/2022 0700  WBC 16.5*  NEUTROABS 13.6*  HGB 13.4  HCT 43.0  MCV 103.9*  PLT 201    Basic Metabolic Panel: No results for input(s): "NA", "K", "CL", "CO2", "GLUCOSE", "BUN", "CREATININE", "CALCIUM", "MG", "PHOS" in the last 168 hours. GFR: CrCl cannot be calculated (Patient's most recent lab result is older than the maximum 21 days allowed.). Recent Labs  Lab 11/05/2022 0700  WBC 16.5*     Liver Function Tests: No results for input(s): "AST", "ALT", "ALKPHOS", "BILITOT", "PROT", "ALBUMIN" in the last 168 hours. No results for input(s): "LIPASE", "AMYLASE" in the last 168 hours. No results for input(s): "AMMONIA" in the last 168 hours.  ABG    Component Value Date/Time   TCO2 25 05/25/2016 2014     Coagulation Profile: No results for input(s): "INR", "PROTIME" in the last 168 hours.  Cardiac Enzymes: No results for input(s): "CKTOTAL", "CKMB", "CKMBINDEX", "TROPONINI" in the last 168 hours.  HbA1C: Hemoglobin A1C  Date/Time Value Ref Range Status  12/13/2018 12:00 AM 6.0  Final   Hgb A1c MFr Bld  Date/Time Value Ref Range Status  01/30/2019 02:56 PM 7.2 (H) 4.8 - 5.6 % Final    Comment:    (NOTE) Pre diabetes:          5.7%-6.4% Diabetes:              >6.4% Glycemic control for   <7.0% adults with diabetes   07/18/2015 10:06 PM 5.3 4.8 - 5.6 % Final    Comment:    (NOTE)         Pre-diabetes: 5.7 - 6.4         Diabetes: >6.4         Glycemic control for adults with diabetes: <7.0     CBG: No results for input(s): "GLUCAP" in the last 168 hours.  Review of Systems:   na  Past Medical History:  She,  has a past medical history of Anxiety, Arthritis, CAD (coronary artery disease), Compression fracture, Edema, History of blood transfusion, Hypercholesteremia, Hypertension, Myocardial infarction ([place]) (2014), Rectal ulceration, SVT (supraventricular tachycardia), and Type II diabetes mellitus ([place]).   Surgical History:   Past Surgical History:  Procedure Laterality Date   BREAST SURGERY     CATARACT EXTRACTION, BILATERAL Bilateral    CHOLECYSTECTOMY     COLONOSCOPY Left 01/12/2013   Procedure: COLONOSCOPY;  Surgeon: Wonda Horner, MD;  Location: [facility] ENDOSCOPY;  Service: Endoscopy;  Laterality: Left;   COLONOSCOPY N/A 01/14/2013   Procedure: COLONOSCOPY;  Surgeon: Wonda Horner, MD;  Location: [facility] ENDOSCOPY;  Service: Endoscopy;  Laterality:  N/A;  Rm 2034, attemped Colon on 2-15=inadequate prep   CORONARY ANGIOPLASTY  12/29/2012   EYE SURGERY Bilateral "many"   FRACTURE SURGERY     KYPHOPLASTY  12/27/2012   Procedure: KYPHOPLASTY;  Surgeon: Sinclair Ship, MD;  Location: [place];  Service: Orthopedics;  Laterality: Bilateral;  T-8 kyphoplasty   KYPHOPLASTY N/A 07/15/2015   Procedure: T11 KYPHOPLASTY;  Surgeon: Phylliss Bob, MD;  Location: [place];  Service: Orthopedics;  Laterality: N/A;  T11 kyphoplasty   KYPHOPLASTY N/A 09/17/2015   Procedure: T12 kyphoplasty;  Surgeon: Phylliss Bob, MD;  Location: [place];  Service: Orthopedics;  Laterality: N/A;  T12 kyphoplasty   LEFT HEART CATHETERIZATION WITH CORONARY ANGIOGRAM N/A 01/08/2013   Procedure: LEFT HEART CATHETERIZATION WITH CORONARY ANGIOGRAM;  Surgeon: Thayer Headings, MD;  Location: [facility] CATH LAB;  Service: Cardiovascular;  Laterality: N/A;   ORIF HIP FRACTURE Left 11/15/2014   Procedure: OPEN REDUCTION INTERNAL FIXATION HIP INTERTROCH;  Surgeon: Mcarthur Rossetti, MD;  Location: [place];  Service: Orthopedics;  Laterality: Left;   PERCUTANEOUS STENT INTERVENTION N/A 01/15/2013   Procedure: PERCUTANEOUS STENT INTERVENTION;  Surgeon: Peter M Martinique, MD;  Location: [facility] CATH LAB;  Service: Cardiovascular;  Laterality: N/A;   TIBIA IM NAIL INSERTION Right 05/26/2016   Procedure: INTRAMEDULLARY (IM) NAIL TIBIAL;  Surgeon: Meredith Pel, MD;  Location: [place];  Service: Orthopedics;  Laterality: Right;   TUBAL LIGATION       Social History:   reports that she has never smoked. She has never used smokeless tobacco. She reports that she does not drink alcohol and does not use drugs.   Family History:  Her family history includes Hypertension in her brother.   Allergies Allergies  Allergen Reactions   Oxycodone Nausea Only     Home Medications  Prior to Admission medications   Medication Sig Start Date End Date Taking? Authorizing Provider  acetaminophen (TYLENOL)  325 MG tablet Take 650 mg by mouth every 6 (six) hours as needed for mild pain.     [provider]  ARIPiprazole (ABILIFY) 5 MG tablet Take 5 mg by mouth daily. 06/08/22   [provider]  calcium-vitamin D (OSCAL WITH D) 500-200 MG-UNIT tablet Take 1 tablet by mouth daily.     [provider]  cephALEXin (KEFLEX) 250 MG capsule Take 1 capsule (250 mg total) by mouth 4 (four) times daily. 06/28/22   Daleen Bo, MD  Cholecalciferol (VITAMIN D) 50 MCG (2000 UT) CAPS Take 2,000 Units by mouth at bedtime.     [provider]  furosemide (LASIX) 40 MG tablet Take 40 mg by mouth daily.     [provider]  ipratropium-albuterol (DUONEB) 0.5-2.5 (3) MG/3ML SOLN Take 3 mLs by nebulization every 6 (six) hours as needed (SOB).    [provider]  lidocaine (LIDODERM) 5 % Place 1 patch onto the skin daily. Remove & Discard patch within 12 hours or as directed by MD . Apply over left lateral chest 12/11/18   Dhungel, Nishant, MD  metFORMIN (GLUCOPHAGE) 500 MG tablet Take 500 mg by mouth daily. 05/20/22   [provider]  metoprolol (LOPRESSOR) 50 MG tablet Take 1 tablet (50 mg total) by mouth 2 (two) times daily. PLEASE CONTACT OFFICE FOR ADDITIONAL REFILLS 09/29/16   Minus Breeding, MD  metoprolol succinate (TOPROL-XL) 25 MG 24 hr tablet Take 25 mg by mouth daily. 05/20/22   [provider]  Multiple Vitamin (MULTIVITAMIN WITH MINERALS) TABS tablet Take 1 tablet by mouth daily.     [provider]  nitroGLYCERIN (NITROSTAT) 0.4 MG SL tablet Place 1 tablet (0.4 mg total) under the tongue every 5 (five) minutes as needed for chest pain. 04/16/13   Richardson Dopp T, PA-C  ondansetron (ZOFRAN) 4 MG tablet Take 4 mg by mouth every 8 (eight) hours as needed for nausea or vomiting.    [provider]  simvastatin (ZOCOR) 40 MG tablet Take 40 mg by mouth at bedtime.     [provider]  tamsulosin (FLOMAX) 0.4 MG CAPS capsule  Take 1 capsule (0.4 mg total) by mouth daily. 02/08/19   Aline August, MD  vitamin B-12 (CYANOCOBALAMIN) 1000 MCG tablet Take 1,000 mcg by mouth daily.    [provider]     Critical care time: 27 min    Richardson Landry  Taras Rask ACNP Acute Care Nurse Practitioner [place] Please consult [place] 10/28/2022, 8:15 AM

## 2022-11-15 NOTE — Progress Notes (Signed)
Berne to attending MD and ICU RN for repeat lactic acid

## 2022-11-15 NOTE — Progress Notes (Signed)
Pharmacy Antibiotic Note  [name] is a 76 y.o. female admitted on 11/19/2022 presenting with AMS and tachycardia, concern 2/2 to UTI with hx of MDR still sens to zosyn.  Pharmacy has been consulted for Zosyn dosing. Now to add Vancomycin dosing. Given dose of Flagyl in ED.   WBC 16.5. PCT 0.27. Lactic acid 4.6.  AKI noted - SCr up at 1.50 (bl ~0.6-0.7).  Some output in foley -turbid and amber. UA looks bacterial. Culture pending.  Cellulitis noted on legs.  Hypothermic on admission.   Plan: Add Vancomycin 1500 mg IV x1, then '1500mg'$  IV every 48 hours (cAUC 461, SCr used 1.5, wt used 74.7 kg).  Zosyn 3.375g IV every 8 hours (extended 4h infusion) Monitor renal function with AKI closely and adjust therapy as needed.  Follow-up culture results and clinical progression  Weight: 74.7 kg (164 lb 10.9 oz)  Temp (24hrs), Avg:97.7 F (36.5 C), Min:97 F (36.1 C), Max:98.4 F (36.9 C)  Recent Labs  Lab 11/12/2022 0700  WBC 16.5*  CREATININE 1.50*  LATICACIDVEN 4.6*    CrCl cannot be calculated (Unknown ideal weight.).    Allergies  Allergen Reactions   Oxycodone Nausea Only   Sloan Leiter, PharmD, BCPS, BCCCP Please refer to [facility] for [place] numbers 11/09/2022 10:39 AM

## 2022-11-15 NOTE — ED Notes (Signed)
Family is on the way. DNR form at bedside. Pt's eyes fixed and mouth open in [place] EOL breathing pattern.

## 2022-11-15 NOTE — Progress Notes (Signed)
[place] code sepsis

## 2022-11-16 ENCOUNTER — Inpatient Hospital Stay (HOSPITAL_COMMUNITY): Payer: Medicare Other

## 2022-11-16 LAB — CBC
HCT: 42.8 % (ref 36.0–46.0)
Hemoglobin: 13.4 g/dL (ref 12.0–15.0)
MCH: 31.8 pg (ref 26.0–34.0)
MCHC: 31.3 g/dL (ref 30.0–36.0)
MCV: 101.7 fL — ABNORMAL HIGH (ref 80.0–100.0)
Platelets: 215 10*3/uL (ref 150–400)
RBC: 4.21 MIL/uL (ref 3.87–5.11)
RDW: 16.8 % — ABNORMAL HIGH (ref 11.5–15.5)
WBC: 22.7 10*3/uL — ABNORMAL HIGH (ref 4.0–10.5)
nRBC: 0 % (ref 0.0–0.2)

## 2022-11-16 LAB — BASIC METABOLIC PANEL
Anion gap: 19 — ABNORMAL HIGH (ref 5–15)
BUN: 22 mg/dL (ref 8–23)
CO2: 13 mmol/L — ABNORMAL LOW (ref 22–32)
Calcium: 7.4 mg/dL — ABNORMAL LOW (ref 8.9–10.3)
Chloride: 111 mmol/L (ref 98–111)
Creatinine, Ser: 1.17 mg/dL — ABNORMAL HIGH (ref 0.44–1.00)
GFR, Estimated: 48 mL/min — ABNORMAL LOW (ref >60)
Glucose, Bld: 144 mg/dL — ABNORMAL HIGH (ref 70–99)
Potassium: 3.5 mmol/L (ref 3.5–5.1)
Sodium: 143 mmol/L (ref 135–145)

## 2022-11-16 LAB — GLUCOSE, CAPILLARY
Glucose-Capillary: 134 mg/dL — ABNORMAL HIGH (ref 70–99)
Glucose-Capillary: 150 mg/dL — ABNORMAL HIGH (ref 70–99)
Glucose-Capillary: 145 mg/dL — ABNORMAL HIGH (ref 70–99)
Glucose-Capillary: 161 mg/dL — ABNORMAL HIGH (ref 70–99)
Glucose-Capillary: 162 mg/dL — ABNORMAL HIGH (ref 70–99)
Glucose-Capillary: 165 mg/dL — ABNORMAL HIGH (ref 70–99)

## 2022-11-16 LAB — MAGNESIUM
Magnesium: 1.4 mg/dL — ABNORMAL LOW (ref 1.7–2.4)
Magnesium: 1.6 mg/dL — ABNORMAL LOW (ref 1.7–2.4)

## 2022-11-16 LAB — PHOSPHORUS
Phosphorus: 4.4 mg/dL (ref 2.5–4.6)
Phosphorus: 4.7 mg/dL — ABNORMAL HIGH (ref 2.5–4.6)

## 2022-11-16 LAB — CORTISOL: Cortisol, Plasma: 50.8 ug/dL

## 2022-11-16 LAB — D-DIMER, QUANTITATIVE: D-Dimer, Quant: 2.94 ug/mL-FEU — ABNORMAL HIGH (ref 0.00–0.50)

## 2022-11-16 MED ORDER — SODIUM CHLORIDE 0.9 % IV BOLUS
500.0000 mL | Freq: Once | INTRAVENOUS | Status: AC
  Administered 2022-11-16: 500 mL via INTRAVENOUS

## 2022-11-16 MED ORDER — PROSOURCE TF20 ENFIT COMPATIBL EN LIQD
60.0000 mL | Freq: Every day | ENTERAL | Status: DC
  Administered 2022-11-16 – 2022-11-17 (×2): 60 mL
  Filled 2022-11-16 (×3): qty 60

## 2022-11-16 MED ORDER — VANCOMYCIN VARIABLE DOSE PER UNSTABLE RENAL FUNCTION (PHARMACIST DOSING): Status: DC

## 2022-11-16 MED ORDER — FUROSEMIDE 10 MG/ML IJ SOLN
40.0000 mg | Freq: Once | INTRAMUSCULAR | Status: AC
  Administered 2022-11-16: 40 mg via INTRAVENOUS
  Filled 2022-11-16: qty 4

## 2022-11-16 MED ORDER — VITAL 1.5 CAL PO LIQD
1000.0000 mL | ORAL | Status: DC
  Administered 2022-11-16 – 2022-11-17 (×2): 1000 mL

## 2022-11-16 MED ORDER — ORAL CARE MOUTH RINSE: 15.0000 mL | OROMUCOSAL | Status: DC | PRN

## 2022-11-16 MED ORDER — ORAL CARE MOUTH RINSE
15.0000 mL | OROMUCOSAL | Status: DC
  Administered 2022-11-16 – 2022-11-18 (×9): 15 mL via OROMUCOSAL

## 2022-11-16 MED ORDER — PIPERACILLIN-TAZOBACTAM IN DEX 2-0.25 GM/50ML IV SOLN
2.2500 g | Freq: Three times a day (TID) | INTRAVENOUS | Status: DC
  Administered 2022-11-16 – 2022-11-18 (×6): 2.25 g via INTRAVENOUS
  Filled 2022-11-16 (×6): qty 50

## 2022-11-16 MED ORDER — ALBUMIN HUMAN 25 % IV SOLN
25.0000 g | Freq: Four times a day (QID) | INTRAVENOUS | Status: AC
  Administered 2022-11-16 – 2022-11-17 (×4): 25 g via INTRAVENOUS
  Filled 2022-11-16 (×4): qty 100

## 2022-11-16 NOTE — Progress Notes (Signed)
Sent secure chat to Dr. Ander Slade and made MD aware that patient has only had 10 cc UOP this shift and that patient bladder was scanned and zero mL resulted. MD acknowledged and ordered lasix.

## 2022-11-16 NOTE — Consult Note (Deleted)
Opened in error

## 2022-11-16 NOTE — Progress Notes (Signed)
Initial Nutrition Assessment  DOCUMENTATION CODES:   Not applicable  INTERVENTION:  - Initiate TF @ 15 mL/hr and advance by 10 mL/hr Q8H to goal rate of 55 mL/hr + TF20 Prosource q day.  - This will provide the patient with 2060 kcals, 109 gm protein and 1008 mL fluid.   NUTRITION DIAGNOSIS:   Inadequate oral intake related to inability to eat as evidenced by NPO status.  GOAL:   Provide needs based on ASPEN/SCCM guidelines  MONITOR:   TF tolerance  REASON FOR ASSESSMENT:   Consult Assessment of nutrition requirement/status, Enteral/tube feeding initiation and management  ASSESSMENT:   76 y.o. female admits related to AMS. PMH includes: HTN, CAD, T2DM, CHF. Pt is currently receiving medical management related to septic shock from possible UTI and cellulitis.  Meds reviewed. Labs reviewed: Mag low. IVF: levophed, NS @ 100 mL/hr.   MD consult for Cortrak tube placement. No family present at time of assessment. Pt is currently oriented x1. Per record, pt has lost a significant amount of weight (66 lbs) over the past 3+ years. No recent wt hx. Pt is currently on pressor support. MD mentioned in note, potential for intubation. RD will initiate semi-elemental formula for now if intubation is required pt can keep the same formula. RD will continue to monitor TF tolerance.   NUTRITION - FOCUSED PHYSICAL EXAM:  Flowsheet Row Most Recent Value  Orbital Region Mild depletion  Upper Arm Region No depletion  Thoracic and Lumbar Region Unable to assess  Buccal Region Mild depletion  Temple Region Moderate depletion  Clavicle Bone Region Moderate depletion  Clavicle and Acromion Bone Region Mild depletion  Scapular Bone Region Unable to assess  Dorsal Hand No depletion  Patellar Region No depletion  Anterior Thigh Region No depletion  Posterior Calf Region No depletion  Edema (RD Assessment) Moderate  Hair Reviewed  Eyes Unable to assess  Mouth Unable to assess  Skin Reviewed   Nails Reviewed       Diet Order:   Diet Order             Diet NPO time specified  Diet effective now                   EDUCATION NEEDS:   Education needs have been addressed  Skin:  Skin Assessment: Skin Integrity Issues: Skin Integrity Issues:: Unstageable Unstageable: medial unstageable wound  Last BM:  unknown  Height:   Ht Readings from Last 1 Encounters:  01/21/19 5' 8.5" (1.74 m)    Weight:   Wt Readings from Last 1 Encounters:  10/31/2022 74.7 kg    Ideal Body Weight:     BMI:  Body mass index is 24.68 kg/m.  Estimated Nutritional Needs:   Kcal:  7741-2878 kcals  Protein:  90-110 gm  Fluid:  >/= 1.8 L  Thalia Bloodgood, RD, LDN, CNSC.

## 2022-11-16 NOTE — Progress Notes (Signed)
NAME:  [name], MRN:  [id number], DOB:  Mar 28, 1946, LOS: 1 ADMISSION DATE:  11/24/2022, CONSULTATION DATE: 11/26/2022 REFERRING MD: Emergency department physician, CHIEF COMPLAINT: Shock  History of Present Illness:  76 year old female, nursing home resident, bedridden, she was a known DNR and presents with sudden onset of decreased level of consciousness and tachycardia.  She was recently admitted for urinary tract infection with multidrug-resistant bacteria.  She has no healthcare power of attorney.  Pulmonary critical care called to the bedside and she will be admitted to the intensive care unit and treated with antibiotics plus or minus vasopressors and she may need intubation.  Pertinent  Medical History   Past Medical History:  Diagnosis Date   Anxiety    Due to current back pain and has to lie flat.   Arthritis    CAD (coronary artery disease)    a. NSTEMI in setting of UTI and SVT 12/2012 => LHC 01/08/13: Proximal LAD 30%, ostial diagonal 30-40%, proximal RCA 95%, inferior AK, EF 45-50% => PCI: Promus DES x 2 to RCA;  b. Echo 01/05/13: EF 28-31%, grade 1 diastolic dysfunction, MAC    Compression fracture    thoracic 12   Edema    History of blood transfusion    Hypercholesteremia    Hypertension    Myocardial infarction [facility]) 2014   Rectal ulceration    colonoscopy 12/2012   SVT (supraventricular tachycardia)    Type II diabetes mellitus ([place])    Significant Hospital Events: Including procedures, antibiotic start and stop dates in addition to other pertinent events   11/16/2022 admitted to ICU  Interim History / Subjective:  Afebrile overnight. Remains on pressors. Awake responding to questions.   Objective   Blood pressure 94/70, pulse 81, temperature 98.3 F (36.8 C), temperature source Oral, resp. rate 15, weight 74.7 kg, SpO2 100 %.    FiO2 (%):  [28 %] 28 %   Intake/Output Summary (Last 24 hours) at 11/16/2022 5176 Last data filed at 11/16/2022 0600 Gross  per 24 hour  Intake 2607.24 ml  Output 60 ml  Net 2547.24 ml   Filed Weights   11/21/2022 1030  Weight: 74.7 kg   Examination: General: Frail elderly female laying in bed minimally responsive HENT: [place]/AT, positive JVD Lungs: Decreased air movement Cardiovascular: tachycardic, JVD to mid neck Abdomen: soft, non tender, and non distended  Extremities: significant anasarca of both LE, cellulitis of both shins Neuro: Awake, answering questions, follow some commands   Resolved Hospital Problem list     Assessment & Plan:  Septic shock from possible UTI and cellulitis History of antibiotic resistant UTI  On low dose norepinephrine with increase requirements  Increasing leukocytosis to 22.7, remains afebrile Blood culture and urine cultures pending  Continue on vancomycin and zosyn, narrow based on culture data SLP consulted was not participative,  if unable will need central line Palliative consulted given DNR status, no relatives   Type 2 diabetes mellitus Sliding-scale insulin  Congestive heart failure no 2D echo since 2020 showed grade 1 diastolic dysfunction Significant LE edema  Received  2L IVF for septic shock yesterday  Monitor fluid status  AKI  Kidney function improving, Continue to monitor  [place] calorie malnutrition  Will order cortrak and start on tube feeding.   Best Practice (right click and "Reselect all SmartList Selections" daily)  Diet/type: NPO DVT prophylaxis:  GI prophylaxis: PPI Lines: N/A Foley:  N/A Code Status:  DNR Last date of multidisciplinary goals of care discussion [  tbd]  Labs   CBC: Recent Labs  Lab 11/25/2022 0700  WBC 16.5*  NEUTROABS 13.6*  HGB 13.4  HCT 43.0  MCV 103.9*  PLT 247     Basic Metabolic Panel: Recent Labs  Lab 11/06/2022 0700  NA 146*  K 3.8  CL 106  CO2 11*  GLUCOSE 153*  BUN 17  CREATININE 1.50*  CALCIUM 9.2   GFR: CrCl cannot be calculated (Unknown ideal weight.). Recent Labs  Lab  11/06/2022 0700 11/07/2022 0955 11/25/2022 0956 11/21/2022 1322  PROCALCITON  --   --  0.27  --   WBC 16.5*  --   --   --   LATICACIDVEN 4.6* 5.5*  --  3.1*     Liver Function Tests: Recent Labs  Lab 11/01/2022 0700  AST 22  ALT 13  ALKPHOS 59  BILITOT 1.5*  PROT 5.8*  ALBUMIN 2.3*   No results for input(s): "LIPASE", "AMYLASE" in the last 168 hours. No results for input(s): "AMMONIA" in the last 168 hours.  ABG    Component Value Date/Time   TCO2 25 05/25/2016 2014     Coagulation Profile: Recent Labs  Lab 11/05/2022 0700  INR 1.2    Cardiac Enzymes: No results for input(s): "CKTOTAL", "CKMB", "CKMBINDEX", "TROPONINI" in the last 168 hours.  HbA1C: Hemoglobin A1C  Date/Time Value Ref Range Status  12/13/2018 12:00 AM 6.0  Final   Hgb A1c MFr Bld  Date/Time Value Ref Range Status  01/30/2019 02:56 PM 7.2 (H) 4.8 - 5.6 % Final    Comment:    (NOTE) Pre diabetes:          5.7%-6.4% Diabetes:              >6.4% Glycemic control for   <7.0% adults with diabetes   07/18/2015 10:06 PM 5.3 4.8 - 5.6 % Final    Comment:    (NOTE)         Pre-diabetes: 5.7 - 6.4         Diabetes: >6.4         Glycemic control for adults with diabetes: <7.0     CBG: Recent Labs  Lab 11/19/2022 1136 11/20/2022 1534 11/27/2022 1912 11/03/2022 2301 11/16/22 0309  GLUCAP 149* 171* 159* 152* 134*    Review of Systems:     Past Medical History:  She,  has a past medical history of Anxiety, Arthritis, CAD (coronary artery disease), Compression fracture, Edema, History of blood transfusion, Hypercholesteremia, Hypertension, Myocardial infarction ([place]) (2014), Rectal ulceration, SVT (supraventricular tachycardia), and Type II diabetes mellitus ([place]).   Surgical History:   Past Surgical History:  Procedure Laterality Date   BREAST SURGERY     CATARACT EXTRACTION, BILATERAL Bilateral    CHOLECYSTECTOMY     COLONOSCOPY Left 01/12/2013   Procedure: COLONOSCOPY;  Surgeon: Wonda Horner,  MD;  Location: [facility] ENDOSCOPY;  Service: Endoscopy;  Laterality: Left;   COLONOSCOPY N/A 01/14/2013   Procedure: COLONOSCOPY;  Surgeon: Wonda Horner, MD;  Location: [facility] ENDOSCOPY;  Service: Endoscopy;  Laterality: N/A;  Rm 2034, attemped Colon on 2-15=inadequate prep   CORONARY ANGIOPLASTY  12/29/2012   EYE SURGERY Bilateral "many"   FRACTURE SURGERY     KYPHOPLASTY  12/27/2012   Procedure: KYPHOPLASTY;  Surgeon: Sinclair Ship, MD;  Location: [place];  Service: Orthopedics;  Laterality: Bilateral;  T-8 kyphoplasty   KYPHOPLASTY N/A 07/15/2015   Procedure: T11 KYPHOPLASTY;  Surgeon: Phylliss Bob, MD;  Location: [place];  Service: Orthopedics;  Laterality: N/A;  T11 kyphoplasty   KYPHOPLASTY N/A 09/17/2015   Procedure: T12 kyphoplasty;  Surgeon: Phylliss Bob, MD;  Location: [place];  Service: Orthopedics;  Laterality: N/A;  T12 kyphoplasty   LEFT HEART CATHETERIZATION WITH CORONARY ANGIOGRAM N/A 01/08/2013   Procedure: LEFT HEART CATHETERIZATION WITH CORONARY ANGIOGRAM;  Surgeon: Thayer Headings, MD;  Location: [facility] CATH LAB;  Service: Cardiovascular;  Laterality: N/A;   ORIF HIP FRACTURE Left 11/15/2014   Procedure: OPEN REDUCTION INTERNAL FIXATION HIP INTERTROCH;  Surgeon: Mcarthur Rossetti, MD;  Location: [place];  Service: Orthopedics;  Laterality: Left;   PERCUTANEOUS STENT INTERVENTION N/A 01/15/2013   Procedure: PERCUTANEOUS STENT INTERVENTION;  Surgeon: Peter M Martinique, MD;  Location: [facility] CATH LAB;  Service: Cardiovascular;  Laterality: N/A;   TIBIA IM NAIL INSERTION Right 05/26/2016   Procedure: INTRAMEDULLARY (IM) NAIL TIBIAL;  Surgeon: Meredith Pel, MD;  Location: [place];  Service: Orthopedics;  Laterality: Right;   TUBAL LIGATION       Social History:   reports that she has never smoked. She has never used smokeless tobacco. She reports that she does not drink alcohol and does not use drugs.   Family History:  Her family history includes Hypertension in her brother.    Allergies Allergies  Allergen Reactions   Oxycodone Nausea Only     Home Medications  Prior to Admission medications   Medication Sig Start Date End Date Taking? Authorizing Provider  acetaminophen (TYLENOL) 325 MG tablet Take 650 mg by mouth every 6 (six) hours as needed for mild pain.     [provider]  ARIPiprazole (ABILIFY) 5 MG tablet Take 5 mg by mouth daily. 06/08/22   [provider]  calcium-vitamin D (OSCAL WITH D) 500-200 MG-UNIT tablet Take 1 tablet by mouth daily.     [provider]  cephALEXin (KEFLEX) 250 MG capsule Take 1 capsule (250 mg total) by mouth 4 (four) times daily. 06/28/22   Daleen Bo, MD  Cholecalciferol (VITAMIN D) 50 MCG (2000 UT) CAPS Take 2,000 Units by mouth at bedtime.     [provider]  furosemide (LASIX) 40 MG tablet Take 40 mg by mouth daily.     [provider]  ipratropium-albuterol (DUONEB) 0.5-2.5 (3) MG/3ML SOLN Take 3 mLs by nebulization every 6 (six) hours as needed (SOB).    [provider]  lidocaine (LIDODERM) 5 % Place 1 patch onto the skin daily. Remove & Discard patch within 12 hours or as directed by MD . Apply over left lateral chest 12/11/18   Dhungel, Nishant, MD  metFORMIN (GLUCOPHAGE) 500 MG tablet Take 500 mg by mouth daily. 05/20/22   [provider]  metoprolol (LOPRESSOR) 50 MG tablet Take 1 tablet (50 mg total) by mouth 2 (two) times daily. PLEASE CONTACT OFFICE FOR ADDITIONAL REFILLS 09/29/16   Minus Breeding, MD  metoprolol succinate (TOPROL-XL) 25 MG 24 hr tablet Take 25 mg by mouth daily. 05/20/22   [provider]  Multiple Vitamin (MULTIVITAMIN WITH MINERALS) TABS tablet Take 1 tablet by mouth daily.     [provider]  nitroGLYCERIN (NITROSTAT) 0.4 MG SL tablet Place 1 tablet (0.4 mg total) under the tongue every 5 (five) minutes as needed for chest pain. 04/16/13   Richardson Dopp T, PA-C  ondansetron (ZOFRAN) 4 MG tablet Take 4 mg by mouth  every 8 (eight) hours as needed for nausea or vomiting.    [provider]  simvastatin (ZOCOR) 40 MG tablet Take  40 mg by mouth at bedtime.     [provider]  tamsulosin (FLOMAX) 0.4 MG CAPS capsule Take 1 capsule (0.4 mg total) by mouth daily. 02/08/19   Aline August, MD  vitamin B-12 (CYANOCOBALAMIN) 1000 MCG tablet Take 1,000 mcg by mouth daily.    [provider]    Iona Beard, MD 11/16/22 9:04 AM IMTS PGY-3 Pager [phone]

## 2022-11-16 NOTE — Procedures (Signed)
Cortrak  Person Inserting Tube:  Jaquel Glassburn D, RD Tube Type:  Cortrak - 43 inches Tube Size:  10 Tube Location:  Left nare Secured by: Bridle Technique Used to Measure Tube Placement:  Marking at nare/corner of mouth Cortrak Secured At:  69 cm Procedure Comments:  Cortrak Tube Team Note:  Consult received to place a Cortrak feeding tube.   X-ray is required, abdominal x-ray has been ordered by the Cortrak team. Please confirm tube placement before using the Cortrak tube.   If the tube becomes dislodged please keep the tube and contact the Cortrak team at [email or web address] for replacement.  If after hours and replacement cannot be delayed, place a NG tube and confirm placement with an abdominal x-ray.    [name], RD, LDN Clinical Dietitian RD pager # available in AMION  After hours/weekend pager # available in AMION    

## 2022-11-16 NOTE — Evaluation (Signed)
Clinical/Bedside Swallow Evaluation Patient Details  Name: [name] MRN: [id number] Date of Birth: 1946/07/23  Today's Date: 11/16/2022 Time: SLP Start Time (ACUTE ONLY): 1000 SLP Stop Time (ACUTE ONLY): 1015 SLP Time Calculation (min) (ACUTE ONLY): 15 min  Past Medical History:  Past Medical History:  Diagnosis Date   Anxiety    Due to current back pain and has to lie flat.   Arthritis    CAD (coronary artery disease)    a. NSTEMI in setting of UTI and SVT 12/2012 => LHC 01/08/13: Proximal LAD 30%, ostial diagonal 30-40%, proximal RCA 95%, inferior AK, EF 45-50% => PCI: Promus DES x 2 to RCA;  b. Echo 01/05/13: EF 77-82%, grade 1 diastolic dysfunction, MAC    Compression fracture    thoracic 12   Edema    History of blood transfusion    Hypercholesteremia    Hypertension    Myocardial infarction [facility]) 2014   Rectal ulceration    colonoscopy 12/2012   SVT (supraventricular tachycardia)    Type II diabetes mellitus ([place])    Past Surgical History:  Past Surgical History:  Procedure Laterality Date   BREAST SURGERY     CATARACT EXTRACTION, BILATERAL Bilateral    CHOLECYSTECTOMY     COLONOSCOPY Left 01/12/2013   Procedure: COLONOSCOPY;  Surgeon: Wonda Horner, MD;  Location: [place];  Service: Endoscopy;  Laterality: Left;   COLONOSCOPY N/A 01/14/2013   Procedure: COLONOSCOPY;  Surgeon: Wonda Horner, MD;  Location: [facility] ENDOSCOPY;  Service: Endoscopy;  Laterality: N/A;  Rm 2034, attemped Colon on 2-15=inadequate prep   CORONARY ANGIOPLASTY  12/29/2012   EYE SURGERY Bilateral "many"   FRACTURE SURGERY     KYPHOPLASTY  12/27/2012   Procedure: KYPHOPLASTY;  Surgeon: Sinclair Ship, MD;  Location: [place];  Service: Orthopedics;  Laterality: Bilateral;  T-8 kyphoplasty   KYPHOPLASTY N/A 07/15/2015   Procedure: T11 KYPHOPLASTY;  Surgeon: Phylliss Bob, MD;  Location: [place];  Service: Orthopedics;  Laterality: N/A;  T11 kyphoplasty   KYPHOPLASTY N/A 09/17/2015    Procedure: T12 kyphoplasty;  Surgeon: Phylliss Bob, MD;  Location: [place];  Service: Orthopedics;  Laterality: N/A;  T12 kyphoplasty   LEFT HEART CATHETERIZATION WITH CORONARY ANGIOGRAM N/A 01/08/2013   Procedure: LEFT HEART CATHETERIZATION WITH CORONARY ANGIOGRAM;  Surgeon: Thayer Headings, MD;  Location: [facility] CATH LAB;  Service: Cardiovascular;  Laterality: N/A;   ORIF HIP FRACTURE Left 11/15/2014   Procedure: OPEN REDUCTION INTERNAL FIXATION HIP INTERTROCH;  Surgeon: Mcarthur Rossetti, MD;  Location: [place];  Service: Orthopedics;  Laterality: Left;   PERCUTANEOUS STENT INTERVENTION N/A 01/15/2013   Procedure: PERCUTANEOUS STENT INTERVENTION;  Surgeon: Peter M Martinique, MD;  Location: [facility] CATH LAB;  Service: Cardiovascular;  Laterality: N/A;   TIBIA IM NAIL INSERTION Right 05/26/2016   Procedure: INTRAMEDULLARY (IM) NAIL TIBIAL;  Surgeon: Meredith Pel, MD;  Location: [place];  Service: Orthopedics;  Laterality: Right;   TUBAL LIGATION     HPI:  Patient is a 76 y.o. female with PMH: CAD, arthritis, MI, HTN, recent admission for UTI with multi drug resistant bacteria. She is bedbound, long term SNF resident. She presented to the hospital from SNF on 11/02/2022 with sudden onset of decreased level of consciousness and tachycardia. She was admitted to the ICU and being treated with antibiotics plus or minus vasopressors. She is currently NPO.    Assessment / Plan / Recommendation  Clinical Impression  Patient presents with clinical s/s of dysphagia as  per this bedside swallow evaluation. Although patient was awake and alert, she was minimally interactive and only able to perform very slight oral motor movements (opening mouth, moving tongue, etc). SLP performed oral care but no significant amount of dried or thick secretions were observed. Patient was able to achieve an instance of very weak voicing when cued but no attempts made to communicate verbally or non-verbally. When very small ice chip  placed in anterior portion of oral cavity, patient did exhibit some movement of lips and tongue but unable to transit ice chip and no initiation of swallow observed. SLP recommending continue NPO and will follow patient for PO readiness. SLP Visit Diagnosis: Dysphagia, unspecified (R13.10)    Aspiration Risk  Severe aspiration risk;Risk for inadequate nutrition/hydration    Diet Recommendation NPO   Medication Administration: Via alternative means    Other  Recommendations Oral Care Recommendations: Oral care QID;Staff/trained caregiver to provide oral care    Recommendations for follow up therapy are one component of a multi-disciplinary discharge planning process, led by the attending physician.  Recommendations may be updated based on patient status, additional functional criteria and insurance authorization.  Follow up Recommendations Skilled nursing-short term rehab (<3 hours/day)      Assistance Recommended at Discharge    Functional Status Assessment Patient has had a recent decline in their functional status and demonstrates the ability to make significant improvements in function in a reasonable and predictable amount of time.  Frequency and Duration min 2x/week  1 week       Prognosis Prognosis for Safe Diet Advancement: Fair Barriers to Reach Goals: Severity of deficits      Swallow Study   General Date of Onset: 11/16/22 HPI: Patient is a 76 y.o. female with PMH: CAD, arthritis, MI, HTN, recent admission for UTI with multi drug resistant bacteria. She is bedbound, long term SNF resident. She presented to the hospital from SNF on 11/03/2022 with sudden onset of decreased level of consciousness and tachycardia. She was admitted to the ICU and being treated with antibiotics plus or minus vasopressors. She is currently NPO. Previous Swallow Assessment: remote, BSE during previous hospitalization 2020 Diet Prior to this Study: NPO Temperature Spikes Noted: No Respiratory  Status: Nasal cannula History of Recent Intubation: No Behavior/Cognition: Lethargic/Drowsy;Requires cueing Oral Cavity Assessment: Dry Oral Care Completed by SLP: Yes Oral Cavity - Dentition: Missing dentition;Other (Comment) (edentulous top, has bottom incisors but no molars) Self-Feeding Abilities: Total assist Patient Positioning: Upright in bed Baseline Vocal Quality: Not observed Volitional Cough: Cognitively unable to elicit Volitional Swallow: Unable to elicit    Oral/Motor/Sensory Function Overall Oral Motor/Sensory Function: Other (comment) (patient unable to adequately perform oral motor movements)   Ice Chips Ice chips: Impaired Oral Phase Impairments: Reduced labial seal;Reduced lingual movement/coordination Oral Phase Functional Implications: Oral holding Pharyngeal Phase Impairments: Other (comments) (no swallow initiation demonstrated)   Thin Liquid Thin Liquid: Not tested    Nectar Thick Nectar Thick Liquid: Not tested   Honey Thick Honey Thick Liquid: Not tested   Puree Puree: Not tested   Solid     Solid: Not tested     Sonia Baller, MA, CCC-SLP Speech Therapy

## 2022-11-16 NOTE — Progress Notes (Signed)
PHARMACY - PHYSICIAN COMMUNICATION CRITICAL VALUE ALERT - BLOOD CULTURE IDENTIFICATION (BCID)  [name] is an 76 y.o. female who presented to [facility] on 11/11/2022 with a chief complaint of altered mental status  Assessment:  Gram stain positive for Gram + rods in 1/3 bottles  Name of physician (or Provider) Contacted: [place]  Current antibiotics: Vancomycin and zosyn  Changes to prescribed antibiotics recommended:  Patient is on recommended antibiotics - No changes needed  No results found for this or any previous visit.  Jodean Lima Bassy Fetterly 11/16/2022  11:09 PM

## 2022-11-16 NOTE — Progress Notes (Signed)
Pharmacy Antibiotic Note  [name] is a 76 y.o. female admitted on 11/24/2022 presenting with AMS and tachycardia, concern 2/2 to UTI with hx of MDR still sens to zosyn.  Pharmacy has been consulted for Zosyn dosing. Now to add Vancomycin dosing. Given dose of Flagyl in ED.   WBC up 22.7. PCT 0.27. Lactic acid 3.1.  SCr improved today, however making little to no urine (only 50 cc to today).  Some output in foley -turbid and amber. UA looks bacterial. Culture pending.  Cellulitis noted on legs.  Hypothermic on admission.   Plan: Hold further Vancomycin and check Vancomycin random in AM - will dose based on levels.  Adjust Zosyn to 2.25g IV every 8 hours (79mn infusion) Monitor renal function specifically UOP and adjust therapy as needed.  Follow-up culture results and clinical progression  Weight: 74.7 kg (164 lb 10.9 oz)  Temp (24hrs), Avg:97.9 F (36.6 C), Min:97.2 F (36.2 C), Max:99 F (37.2 C)  Recent Labs  Lab 11/09/2022 0700 11/08/2022 0955 11/11/2022 1322 11/16/22 0628  WBC 16.5*  --   --  22.7*  CREATININE 1.50*  --   --  1.17*  LATICACIDVEN 4.6* 5.5* 3.1*  --     CrCl cannot be calculated (Unknown ideal weight.).    Allergies  Allergen Reactions   Oxycodone Nausea Only   JSloan Leiter PharmD, BCPS, BCCCP [facility] 11/16/2022 11:20 AM

## 2022-11-16 NOTE — Progress Notes (Signed)
Dr. Ander Slade present and is aware that patient has only had 10 cc urine output from IV lasix that was given. MD acknowledged and no orders given.

## 2022-11-17 ENCOUNTER — Encounter (HOSPITAL_COMMUNITY): Payer: Medicare Other

## 2022-11-17 DIAGNOSIS — Z515 Encounter for palliative care: Secondary | ICD-10-CM

## 2022-11-17 DIAGNOSIS — R4182 Altered mental status, unspecified: Secondary | ICD-10-CM | POA: Diagnosis not present

## 2022-11-17 DIAGNOSIS — Z7189 Other specified counseling: Secondary | ICD-10-CM

## 2022-11-17 DIAGNOSIS — R579 Shock, unspecified: Secondary | ICD-10-CM | POA: Diagnosis not present

## 2022-11-17 DIAGNOSIS — Z66 Do not resuscitate: Secondary | ICD-10-CM | POA: Diagnosis not present

## 2022-11-17 LAB — COMPREHENSIVE METABOLIC PANEL
ALT: 13 U/L (ref 0–44)
AST: 21 U/L (ref 15–41)
Albumin: 2.9 g/dL — ABNORMAL LOW (ref 3.5–5.0)
Alkaline Phosphatase: 38 U/L (ref 38–126)
Anion gap: 13 (ref 5–15)
BUN: 32 mg/dL — ABNORMAL HIGH (ref 8–23)
CO2: 19 mmol/L — ABNORMAL LOW (ref 22–32)
Calcium: 7.9 mg/dL — ABNORMAL LOW (ref 8.9–10.3)
Chloride: 109 mmol/L (ref 98–111)
Creatinine, Ser: 1.64 mg/dL — ABNORMAL HIGH (ref 0.44–1.00)
GFR, Estimated: 32 mL/min — ABNORMAL LOW (ref >60)
Glucose, Bld: 165 mg/dL — ABNORMAL HIGH (ref 70–99)
Potassium: 2.7 mmol/L — CL (ref 3.5–5.1)
Sodium: 141 mmol/L (ref 135–145)
Total Bilirubin: 0.9 mg/dL (ref 0.3–1.2)
Total Protein: 5.3 g/dL — ABNORMAL LOW (ref 6.5–8.1)

## 2022-11-17 LAB — URINALYSIS, ROUTINE W REFLEX MICROSCOPIC
Bilirubin Urine: NEGATIVE
Glucose, UA: NEGATIVE mg/dL
Ketones, ur: NEGATIVE mg/dL
Nitrite: NEGATIVE
Protein, ur: NEGATIVE mg/dL
Specific Gravity, Urine: 1.006 (ref 1.005–1.030)
pH: 5 (ref 5.0–8.0)

## 2022-11-17 LAB — GLUCOSE, CAPILLARY
Glucose-Capillary: 154 mg/dL — ABNORMAL HIGH (ref 70–99)
Glucose-Capillary: 146 mg/dL — ABNORMAL HIGH (ref 70–99)
Glucose-Capillary: 142 mg/dL — ABNORMAL HIGH (ref 70–99)
Glucose-Capillary: 161 mg/dL — ABNORMAL HIGH (ref 70–99)
Glucose-Capillary: 171 mg/dL — ABNORMAL HIGH (ref 70–99)
Glucose-Capillary: 175 mg/dL — ABNORMAL HIGH (ref 70–99)

## 2022-11-17 LAB — VANCOMYCIN, RANDOM: Vancomycin Rm: 16 ug/mL

## 2022-11-17 LAB — CBC
HCT: 34.1 % — ABNORMAL LOW (ref 36.0–46.0)
Hemoglobin: 10.8 g/dL — ABNORMAL LOW (ref 12.0–15.0)
MCH: 31.5 pg (ref 26.0–34.0)
MCHC: 31.7 g/dL (ref 30.0–36.0)
MCV: 99.4 fL (ref 80.0–100.0)
Platelets: 137 10*3/uL — ABNORMAL LOW (ref 150–400)
RBC: 3.43 MIL/uL — ABNORMAL LOW (ref 3.87–5.11)
RDW: 16.6 % — ABNORMAL HIGH (ref 11.5–15.5)
WBC: 13.7 10*3/uL — ABNORMAL HIGH (ref 4.0–10.5)
nRBC: 0 % (ref 0.0–0.2)

## 2022-11-17 LAB — PHOSPHORUS
Phosphorus: 3.4 mg/dL (ref 2.5–4.6)
Phosphorus: 2.6 mg/dL (ref 2.5–4.6)

## 2022-11-17 LAB — MAGNESIUM
Magnesium: 1.3 mg/dL — ABNORMAL LOW (ref 1.7–2.4)
Magnesium: 1.8 mg/dL (ref 1.7–2.4)

## 2022-11-17 MED ORDER — POTASSIUM CHLORIDE 20 MEQ PO PACK
40.0000 meq | PACK | Freq: Two times a day (BID) | ORAL | Status: AC
  Administered 2022-11-17 (×2): 40 meq via ORAL
  Filled 2022-11-17 (×2): qty 2

## 2022-11-17 MED ORDER — POTASSIUM CHLORIDE 10 MEQ/100ML IV SOLN
10.0000 meq | INTRAVENOUS | Status: AC
  Administered 2022-11-17 (×2): 10 meq via INTRAVENOUS
  Filled 2022-11-17 (×2): qty 100

## 2022-11-17 MED ORDER — MAGNESIUM SULFATE 2 GM/50ML IV SOLN
2.0000 g | Freq: Once | INTRAVENOUS | Status: AC
  Administered 2022-11-17: 2 g via INTRAVENOUS
  Filled 2022-11-17: qty 50

## 2022-11-17 NOTE — Progress Notes (Signed)
Made Dr. Ander Slade aware of K+ 2.7, MD acknowledged and placed orders.

## 2022-11-17 NOTE — Progress Notes (Signed)
SLP Cancellation Note  Patient Details Name: [name] MRN: [id number] DOB: 09-03-1946   Cancelled treatment:       Reason Eval/Treat Not Completed: Patient's level of consciousness. Patient asleep and per RN she has not been adequately alert. She has Cortrak for nutrition. SLP will continue to follow for PO readiness.  Sonia Baller, MA, CCC-SLP Speech Therapy

## 2022-11-17 NOTE — Progress Notes (Signed)
NAME:  [name], MRN:  [id number], DOB:  07-27-1946, LOS: 1 ADMISSION DATE:  11/24/2022, CONSULTATION DATE: 11/25/2022 REFERRING MD: Emergency department physician, CHIEF COMPLAINT: Shock  History of Present Illness:  76 year old female, nursing home resident, bedridden, she was a known DNR and presents with sudden onset of decreased level of consciousness and tachycardia.  She was recently admitted for urinary tract infection with multidrug-resistant bacteria.  She has no healthcare power of attorney.  Pulmonary critical care called to the bedside and she will be admitted to the intensive care unit and treated with antibiotics plus or minus vasopressors and she may need intubation.  Pertinent  Medical History   Past Medical History:  Diagnosis Date   Anxiety    Due to current back pain and has to lie flat.   Arthritis    CAD (coronary artery disease)    a. NSTEMI in setting of UTI and SVT 12/2012 => LHC 01/08/13: Proximal LAD 30%, ostial diagonal 30-40%, proximal RCA 95%, inferior AK, EF 45-50% => PCI: Promus DES x 2 to RCA;  b. Echo 01/05/13: EF 15-40%, grade 1 diastolic dysfunction, MAC    Compression fracture    thoracic 12   Edema    History of blood transfusion    Hypercholesteremia    Hypertension    Myocardial infarction [facility]) 2014   Rectal ulceration    colonoscopy 12/2012   SVT (supraventricular tachycardia)    Type II diabetes mellitus ([place])    Significant Hospital Events: Including procedures, antibiotic start and stop dates in addition to other pertinent events   11/16/2022 admitted to ICU  Interim History / Subjective:  Diarrhea overnight. Decreased pressor requirements.  Blood cultures with  1/3 gram positive rods in anaerobic bottle  Objective   Blood pressure 94/70, pulse 81, temperature 98.3 F (36.8 C), temperature source Oral, resp. rate 15, weight 74.7 kg, SpO2 100 %.    FiO2 (%):  [28 %] 28 %   Intake/Output Summary (Last 24 hours) at 11/16/2022  0867 Last data filed at 11/16/2022 0600 Gross per 24 hour  Intake 2607.24 ml  Output 60 ml  Net 2547.24 ml   Filed Weights   11/06/2022 1030  Weight: 74.7 kg   Examination: General: Frail elderly female laying in bed,appears uncomfortable HENT: [place]/AT, positive JVD Lungs: Decreased air movement bilaterally, no crackles or wheezing Cardiovascular: regular rate, bounding pulses  Abdomen: soft, non tender, and non distended  Extremities: significant anasarca, cellulitis of both slower extremities Neuro: Awake, eyes tracking, nods and shakes head appropriately to questions   RUQ decreased echogenicity of the liver Dvt studies negative  Ddimer 2.94   Resolved Hospital Problem list     Assessment & Plan:  Septic shock from possible UTI and cellulitis History of ESBL UTI  On low dose norepinephrine, BP improved with IV fluids, decreased norepinephrine.Will attempt to wean off, if needed could start midodrine per tube Collapsible vasculature on bedside neck US and given more fluids for intravascular depletion Leukocytosis improved to 13.7 Cortisol levels appropriate Blood culture with gram positive rods but only 1/3 vials Urine studies need to be resent Narrow cultures to zosyn, stop vancomycin Palliative consulted given DNR status, no family and patient unable to communicate goals of care, have not seem patient yet  Type 2 diabetes mellitus Sliding-scale insulin  Congestive heart failure no 2D echo since 2020 showed grade 1 diastolic dysfunction Significant LE edema likely due to third spacing, albumin 2.3 Given albumin yesterday Anasarca but maintaining saturations  Stop fluids is net 6L positive Echocardiogram   AKI  Increase urine output this morning.  Cr up to 1.6 today, baseline unclear but around 0.6-0.7 3 years ago Monitor I/os, avoid nephrotoxins  Possible VTE Beside [place] with possible venous thrombus Ddimer elevated, RUQ doppler negative DVT study pending,  collapsible LE veins on bedside US Worsening kidney function and oliguria, woud avoid contrast CT, would on tolerate VQ scan Continue ppx heparin  [place] calorie malnutrition  Cortrak in place  Tolerating tube feeds  Best Practice (right click and "Reselect all SmartList Selections" daily)  Diet/type: NPO, SLP following DVT prophylaxis:  GI prophylaxis: PPI Lines: N/A Foley:  N/A Code Status:  DNR Last date of multidisciplinary goals of care discussion [tbd]  Labs   CBC: Recent Labs  Lab 11/12/2022 0700  WBC 16.5*  NEUTROABS 13.6*  HGB 13.4  HCT 43.0  MCV 103.9*  PLT 247     Basic Metabolic Panel: Recent Labs  Lab 11/13/2022 0700  NA 146*  K 3.8  CL 106  CO2 11*  GLUCOSE 153*  BUN 17  CREATININE 1.50*  CALCIUM 9.2   GFR: CrCl cannot be calculated (Unknown ideal weight.). Recent Labs  Lab 11/26/2022 0700 11/22/2022 0955 11/25/2022 0956 11/08/2022 1322  PROCALCITON  --   --  0.27  --   WBC 16.5*  --   --   --   LATICACIDVEN 4.6* 5.5*  --  3.1*     Liver Function Tests: Recent Labs  Lab 11/06/2022 0700  AST 22  ALT 13  ALKPHOS 59  BILITOT 1.5*  PROT 5.8*  ALBUMIN 2.3*   No results for input(s): "LIPASE", "AMYLASE" in the last 168 hours. No results for input(s): "AMMONIA" in the last 168 hours.  ABG    Component Value Date/Time   TCO2 25 05/25/2016 2014     Coagulation Profile: Recent Labs  Lab 11/13/2022 0700  INR 1.2    Cardiac Enzymes: No results for input(s): "CKTOTAL", "CKMB", "CKMBINDEX", "TROPONINI" in the last 168 hours.  HbA1C: Hemoglobin A1C  Date/Time Value Ref Range Status  12/13/2018 12:00 AM 6.0  Final   Hgb A1c MFr Bld  Date/Time Value Ref Range Status  01/30/2019 02:56 PM 7.2 (H) 4.8 - 5.6 % Final    Comment:    (NOTE) Pre diabetes:          5.7%-6.4% Diabetes:              >6.4% Glycemic control for   <7.0% adults with diabetes   07/18/2015 10:06 PM 5.3 4.8 - 5.6 % Final    Comment:    (NOTE)         Pre-diabetes:  5.7 - 6.4         Diabetes: >6.4         Glycemic control for adults with diabetes: <7.0     CBG: Recent Labs  Lab 11/10/2022 1136 11/20/2022 1534 11/02/2022 1912 10/30/2022 2301 11/16/22 0309  GLUCAP 149* 171* 159* 152* 134*    Review of Systems:     Past Medical History:  She,  has a past medical history of Anxiety, Arthritis, CAD (coronary artery disease), Compression fracture, Edema, History of blood transfusion, Hypercholesteremia, Hypertension, Myocardial infarction ([place]) (2014), Rectal ulceration, SVT (supraventricular tachycardia), and Type II diabetes mellitus ([place]).   Surgical History:   Past Surgical History:  Procedure Laterality Date   BREAST SURGERY     CATARACT EXTRACTION, BILATERAL Bilateral    CHOLECYSTECTOMY  COLONOSCOPY Left 01/12/2013   Procedure: COLONOSCOPY;  Surgeon: Wonda Horner, MD;  Location: [facility] ENDOSCOPY;  Service: Endoscopy;  Laterality: Left;   COLONOSCOPY N/A 01/14/2013   Procedure: COLONOSCOPY;  Surgeon: Wonda Horner, MD;  Location: [facility] ENDOSCOPY;  Service: Endoscopy;  Laterality: N/A;  Rm 2034, attemped Colon on 2-15=inadequate prep   CORONARY ANGIOPLASTY  12/29/2012   EYE SURGERY Bilateral "many"   FRACTURE SURGERY     KYPHOPLASTY  12/27/2012   Procedure: KYPHOPLASTY;  Surgeon: Sinclair Ship, MD;  Location: [place];  Service: Orthopedics;  Laterality: Bilateral;  T-8 kyphoplasty   KYPHOPLASTY N/A 07/15/2015   Procedure: T11 KYPHOPLASTY;  Surgeon: Phylliss Bob, MD;  Location: [place];  Service: Orthopedics;  Laterality: N/A;  T11 kyphoplasty   KYPHOPLASTY N/A 09/17/2015   Procedure: T12 kyphoplasty;  Surgeon: Phylliss Bob, MD;  Location: [place];  Service: Orthopedics;  Laterality: N/A;  T12 kyphoplasty   LEFT HEART CATHETERIZATION WITH CORONARY ANGIOGRAM N/A 01/08/2013   Procedure: LEFT HEART CATHETERIZATION WITH CORONARY ANGIOGRAM;  Surgeon: Thayer Headings, MD;  Location[facility] CATH LAB;  Service: Cardiovascular;  Laterality: N/A;   ORIF  HIP FRACTURE Left 11/15/2014   Procedure: OPEN REDUCTION INTERNAL FIXATION HIP INTERTROCH;  Surgeon: Mcarthur Rossetti, MD;  Location: [place];  Service: Orthopedics;  Laterality: Left;   PERCUTANEOUS STENT INTERVENTION N/A 01/15/2013   Procedure: PERCUTANEOUS STENT INTERVENTION;  Surgeon: Peter M Martinique, MD;  Location[facility] CATH LAB;  Service: Cardiovascular;  Laterality: N/A;   TIBIA IM NAIL INSERTION Right 05/26/2016   Procedure: INTRAMEDULLARY (IM) NAIL TIBIAL;  Surgeon: Meredith Pel, MD;  Location: [place];  Service: Orthopedics;  Laterality: Right;   TUBAL LIGATION       Social History:   reports that she has never smoked. She has never used smokeless tobacco. She reports that she does not drink alcohol and does not use drugs.   Family History:  Her family history includes Hypertension in her brother.   Allergies Allergies  Allergen Reactions   Oxycodone Nausea Only     Home Medications  Prior to Admission medications   Medication Sig Start Date End Date Taking? Authorizing Provider  acetaminophen (TYLENOL) 325 MG tablet Take 650 mg by mouth every 6 (six) hours as needed for mild pain.     [provider]  ARIPiprazole (ABILIFY) 5 MG tablet Take 5 mg by mouth daily. 06/08/22   [provider]  calcium-vitamin D (OSCAL WITH D) 500-200 MG-UNIT tablet Take 1 tablet by mouth daily.     [provider]  cephALEXin (KEFLEX) 250 MG capsule Take 1 capsule (250 mg total) by mouth 4 (four) times daily. 06/28/22   Daleen Bo, MD  Cholecalciferol (VITAMIN D) 50 MCG (2000 UT) CAPS Take 2,000 Units by mouth at bedtime.     [provider]  furosemide (LASIX) 40 MG tablet Take 40 mg by mouth daily.     [provider]  ipratropium-albuterol (DUONEB) 0.5-2.5 (3) MG/3ML SOLN Take 3 mLs by nebulization every 6 (six) hours as needed (SOB).    [provider]  lidocaine (LIDODERM) 5 % Place 1 patch onto the skin daily. Remove & Discard  patch within 12 hours or as directed by MD . Apply over left lateral chest 12/11/18   Dhungel, Nishant, MD  metFORMIN (GLUCOPHAGE) 500 MG tablet Take 500 mg by mouth daily. 05/20/22   [provider]  metoprolol (LOPRESSOR) 50 MG tablet Take 1 tablet (50 mg total) by  mouth 2 (two) times daily. PLEASE CONTACT OFFICE FOR ADDITIONAL REFILLS 09/29/16   Minus Breeding, MD  metoprolol succinate (TOPROL-XL) 25 MG 24 hr tablet Take 25 mg by mouth daily. 05/20/22   [provider]  Multiple Vitamin (MULTIVITAMIN WITH MINERALS) TABS tablet Take 1 tablet by mouth daily.     [provider]  nitroGLYCERIN (NITROSTAT) 0.4 MG SL tablet Place 1 tablet (0.4 mg total) under the tongue every 5 (five) minutes as needed for chest pain. 04/16/13   Richardson Dopp T, PA-C  ondansetron (ZOFRAN) 4 MG tablet Take 4 mg by mouth every 8 (eight) hours as needed for nausea or vomiting.    [provider]  simvastatin (ZOCOR) 40 MG tablet Take 40 mg by mouth at bedtime.     [provider]  tamsulosin (FLOMAX) 0.4 MG CAPS capsule Take 1 capsule (0.4 mg total) by mouth daily. 02/08/19   Aline August, MD  vitamin B-12 (CYANOCOBALAMIN) 1000 MCG tablet Take 1,000 mcg by mouth daily.    [provider]    Iona Beard, MD 11/17/22 IMTS PGY-3 Pager [phone]

## 2022-11-17 NOTE — Progress Notes (Signed)
[facility] Progress Note Patient Name: [name] DOB: June 13, 1946 MRN: [id number]   Date of Service  11/17/2022  HPI/Events of Note  Requesting flexiseal, pt having multiple liquid stools No contraindication identified  eICU Interventions  Fecal management system ordered     Intervention Category Intermediate Interventions: Other:  Judd Lien 11/17/2022, 6:04 AM

## 2022-11-17 NOTE — Consult Note (Signed)
Consultation Note Date: 11/17/2022   Patient Name: [name]  DOB: Jun 18, 1946  MRN: [id number]  Age / Sex: 76 y.o., female  PCP: Shirline Frees, MD Referring Physician: Laurin Coder, MD  Reason for Consultation: Establishing goals of care  HPI/Patient Profile: 76 y.o. female  with past medical history of Anxiety, arthritis, CAD, HLD, hypertension, MI, and diabetes admitted on 11/01/2022 with AMS and tachycardia.  Patient diagnosed with septic shock from possible UTI and cellulitis.  Patient with ongoing AMS.  Unable to maintain p.o. intake and so core track placed.  PMT consulted to discuss goals of care.  Clinical Assessment and Goals of Care: I have reviewed medical records including EPIC notes, labs and imaging, received report from RN, assessed the patient and then spoke with patient's friend [name] to discuss diagnosis prognosis, [employer], EOL wishes, disposition and options.  I introduced Palliative Medicine as specialized medical care for people living with serious illness. It focuses on providing relief from the symptoms and stress of a serious illness. The goal is to improve quality of life for both the patient and the family.  [name] tells me patient has no family. 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 We discuss that in [place] without HCPOA, legal guardian or family members, "an individual with an established relationship with the patient who is acting in good faith and can reliably convey the wishes of the patient" may serve as Scientist, research (medical).   As far as functional and nutritional status Delphine tells me of a decline. Tells me patient has been living at SNF with poor quality of life. Tells me patient is mostly bedbound. Occasionally is put in wheelchair but cannot tolerate it long.    We discussed patient's current illness and what it means in the larger context of patient's on-going  co-morbidities.  Natural disease trajectory and expectations at EOL were discussed.  I attempted to elicit values and goals of care important to the patient.    The difference between aggressive medical intervention and comfort care was considered in light of the patient's goals of care.   Advance directives, concepts specific to code status, artificial feeding and hydration, and rehospitalization were considered and discussed.  Discussed with [name] the importance of continued conversation with family and the medical providers regarding overall plan of care and treatment options, ensuring decisions are within the context of the patients values and GOCs.    Hospice services outpatient were explained and offered.  We discussed the need for further discussion. [name] would like to come see patient. We planned in person meeting tomorrow 11 AM.  Questions and concerns were addressed. The family was encouraged to call with questions or concerns.  Primary Decision Maker OTHER - no family, no HCPOA - [name] Kary Kos -an individual with an established relationship with the patient who is acting in good faith and can reliably convey the wishes of the patient    SUMMARY OF RECOMMENDATIONS   Meeting planned with friend [name] for 12/22 11 am - [name] will serve as medical decision maker [name] indicates today she does not feel patient would want to pursue aggressive medical care, may consider transition to comfort approach and hospice  Code Status/Advance Care Planning: DNR      Primary Diagnoses: Present on Admission:  Shock ([place])   I have reviewed the medical record, interviewed the patient and family, and examined the patient. The following aspects are pertinent.  Past Medical History:  Diagnosis  Date   Anxiety    Due to current back pain and has to lie flat.   Arthritis    CAD (coronary artery disease)    a. NSTEMI in setting of UTI and SVT 12/2012 => LHC 01/08/13: Proximal  LAD 30%, ostial diagonal 30-40%, proximal RCA 95%, inferior AK, EF 45-50% => PCI: Promus DES x 2 to RCA;  b. Echo 01/05/13: EF 36-64%, grade 1 diastolic dysfunction, MAC    Compression fracture    thoracic 12   Edema    History of blood transfusion    Hypercholesteremia    Hypertension    Myocardial infarction [facility]) 2014   Rectal ulceration    colonoscopy 12/2012   SVT (supraventricular tachycardia)    Type II diabetes mellitus ([place])    Social History   Socioeconomic History   Marital status: Widowed    Spouse name: Not on file   Number of children: Not on file   Years of education: Not on file   Highest education level: Not on file  Occupational History   Not on file  Tobacco Use   Smoking status: Never   Smokeless tobacco: Never  Substance and Sexual Activity   Alcohol use: No   Drug use: No   Sexual activity: Yes  Other Topics Concern   Not on file  Social History Narrative   Lives alone.   Social Determinants of Health   Financial Resource Strain: Not on file  Food Insecurity: Not on file  Transportation Needs: Not on file  Physical Activity: Not on file  Stress: Not on file  Social Connections: Not on file   Family History  Problem Relation Age of Onset   Hypertension Brother    Scheduled Meds:  Chlorhexidine Gluconate Cloth  6 each Topical Daily   feeding supplement (PROSource TF20)  60 mL Per Tube Daily   heparin  5,000 Units Subcutaneous Q8H   mouth rinse  15 mL Mouth Rinse 4 times per day   potassium chloride  40 mEq Oral BID   Continuous Infusions:  sodium chloride     sodium chloride     feeding supplement (VITAL 1.5 CAL) 35 mL/hr at 11/17/22 1200   norepinephrine (LEVOPHED) Adult infusion 2 mcg/min (11/17/22 1200)   piperacillin-tazobactam (ZOSYN)  IV Stopped (11/17/22 1056)   potassium chloride 10 mEq (11/17/22 1224)   PRN Meds:.docusate sodium, mouth rinse, polyethylene glycol Allergies  Allergen Reactions   Oxycodone Nausea Only   Review  of Systems  Unable to perform ROS: Mental status change    Physical Exam Constitutional:      General: She is not in acute distress.    Appearance: She is ill-appearing.     Comments: Opens eyes to voice, unable to verbalize  Pulmonary:     Effort: Pulmonary effort is normal.  Musculoskeletal:     Right lower leg: Edema present.     Left lower leg: Edema present.  Skin:    General: Skin is warm and dry.  Neurological:     Mental Status: She is disoriented.     Vital Signs: BP 112/68 (BP Location: Left Arm)   Pulse 96   Temp 97.7 F (36.5 C) (Axillary)   Resp 14   Wt 74.7 kg   SpO2 100%   BMI 24.68 kg/m  Pain Scale: 0-10   Pain Score: 0-No pain   SpO2: SpO2: 100 % O2 Device:SpO2: 100 % O2 Flow Rate: .O2 Flow Rate (L/min): 2 L/min  IO: Intake/output summary:  Intake/Output Summary (Last 24 hours) at 11/17/2022 1249 Last data filed at 11/17/2022 1200 Gross per 24 hour  Intake 4148.54 ml  Output 470 ml  Net 3678.54 ml    LBM: Last BM Date :  (PTA) Baseline Weight: Weight: 74.7 kg Most recent weight: Weight: 74.7 kg     Palliative Assessment/Data:PPS 30% w/ cortrak - 10% w/o    *Please note that this is a verbal dictation therefore any spelling or grammatical errors are due to the "[place]" system interpretation.  Juel Burrow, DNP, AGNP-C Palliative Medicine Team [phone] Pager: [phone]

## 2022-11-18 ENCOUNTER — Inpatient Hospital Stay (HOSPITAL_COMMUNITY): Payer: Medicare Other

## 2022-11-18 DIAGNOSIS — Z7189 Other specified counseling: Secondary | ICD-10-CM | POA: Diagnosis not present

## 2022-11-18 DIAGNOSIS — Z515 Encounter for palliative care: Secondary | ICD-10-CM | POA: Diagnosis not present

## 2022-11-18 LAB — COMPREHENSIVE METABOLIC PANEL
ALT: 14 U/L (ref 0–44)
AST: 16 U/L (ref 15–41)
Albumin: 2.4 g/dL — ABNORMAL LOW (ref 3.5–5.0)
Alkaline Phosphatase: 44 U/L (ref 38–126)
Anion gap: 14 (ref 5–15)
BUN: 41 mg/dL — ABNORMAL HIGH (ref 8–23)
CO2: 18 mmol/L — ABNORMAL LOW (ref 22–32)
Calcium: 8.3 mg/dL — ABNORMAL LOW (ref 8.9–10.3)
Chloride: 111 mmol/L (ref 98–111)
Creatinine, Ser: 1.82 mg/dL — ABNORMAL HIGH (ref 0.44–1.00)
GFR, Estimated: 28 mL/min — ABNORMAL LOW (ref >60)
Glucose, Bld: 236 mg/dL — ABNORMAL HIGH (ref 70–99)
Potassium: 2.9 mmol/L — ABNORMAL LOW (ref 3.5–5.1)
Sodium: 143 mmol/L (ref 135–145)
Total Bilirubin: 0.5 mg/dL (ref 0.3–1.2)
Total Protein: 4.9 g/dL — ABNORMAL LOW (ref 6.5–8.1)

## 2022-11-18 LAB — PHOSPHORUS: Phosphorus: 1.5 mg/dL — ABNORMAL LOW (ref 2.5–4.6)

## 2022-11-18 LAB — CBC WITH DIFFERENTIAL/PLATELET
Abs Immature Granulocytes: 0.08 10*3/uL — ABNORMAL HIGH (ref 0.00–0.07)
Basophils Absolute: 0 10*3/uL (ref 0.0–0.1)
Basophils Relative: 0 %
Eosinophils Absolute: 0 10*3/uL (ref 0.0–0.5)
Eosinophils Relative: 0 %
HCT: 30.1 % — ABNORMAL LOW (ref 36.0–46.0)
Hemoglobin: 10.2 g/dL — ABNORMAL LOW (ref 12.0–15.0)
Immature Granulocytes: 1 %
Lymphocytes Relative: 6 %
Lymphs Abs: 0.9 10*3/uL (ref 0.7–4.0)
MCH: 32.5 pg (ref 26.0–34.0)
MCHC: 33.9 g/dL (ref 30.0–36.0)
MCV: 95.9 fL (ref 80.0–100.0)
Monocytes Absolute: 1.1 10*3/uL — ABNORMAL HIGH (ref 0.1–1.0)
Monocytes Relative: 8 %
Neutro Abs: 11.4 10*3/uL — ABNORMAL HIGH (ref 1.7–7.7)
Neutrophils Relative %: 85 %
Platelets: 120 10*3/uL — ABNORMAL LOW (ref 150–400)
RBC: 3.14 MIL/uL — ABNORMAL LOW (ref 3.87–5.11)
RDW: 16.4 % — ABNORMAL HIGH (ref 11.5–15.5)
WBC: 13.5 10*3/uL — ABNORMAL HIGH (ref 4.0–10.5)
nRBC: 0 % (ref 0.0–0.2)

## 2022-11-18 LAB — GLUCOSE, CAPILLARY
Glucose-Capillary: 207 mg/dL — ABNORMAL HIGH (ref 70–99)
Glucose-Capillary: 197 mg/dL — ABNORMAL HIGH (ref 70–99)

## 2022-11-18 LAB — MRSA NEXT GEN BY PCR, NASAL: MRSA by PCR Next Gen: DETECTED — AB

## 2022-11-18 LAB — MAGNESIUM: Magnesium: 1.8 mg/dL (ref 1.7–2.4)

## 2022-11-18 MED ORDER — LORAZEPAM 2 MG/ML IJ SOLN
1.0000 mg | Freq: Four times a day (QID) | INTRAMUSCULAR | Status: DC
  Administered 2022-11-18: 1 mg via INTRAVENOUS
  Filled 2022-11-18: qty 1

## 2022-11-18 MED ORDER — HYDROMORPHONE BOLUS VIA INFUSION
0.5000 mg | INTRAVENOUS | Status: DC | PRN
  Administered 2022-11-18: 0.5 mg via INTRAVENOUS

## 2022-11-18 MED ORDER — POTASSIUM CHLORIDE 20 MEQ PO PACK
40.0000 meq | PACK | ORAL | Status: DC
  Administered 2022-11-18: 40 meq
  Filled 2022-11-18: qty 2

## 2022-11-18 MED ORDER — INSULIN ASPART 100 UNIT/ML IJ SOLN
0.0000 [IU] | INTRAMUSCULAR | Status: DC
  Administered 2022-11-18: 3 [IU] via SUBCUTANEOUS
  Administered 2022-11-18: 5 [IU] via SUBCUTANEOUS

## 2022-11-18 MED ORDER — ACETAMINOPHEN 325 MG PO TABS: 650.0000 mg | ORAL_TABLET | Freq: Four times a day (QID) | ORAL | Status: DC | PRN

## 2022-11-18 MED ORDER — ONDANSETRON HCL 4 MG/2ML IJ SOLN: 4.0000 mg | Freq: Four times a day (QID) | INTRAMUSCULAR | Status: DC | PRN

## 2022-11-18 MED ORDER — LORAZEPAM 2 MG/ML IJ SOLN: 0.5000 mg | INTRAMUSCULAR | Status: DC | PRN

## 2022-11-18 MED ORDER — ACETAMINOPHEN 650 MG RE SUPP: 650.0000 mg | Freq: Four times a day (QID) | RECTAL | Status: DC | PRN

## 2022-11-18 MED ORDER — GLYCOPYRROLATE 0.2 MG/ML IJ SOLN: 0.2000 mg | INTRAMUSCULAR | Status: DC | PRN

## 2022-11-18 MED ORDER — ONDANSETRON 4 MG PO TBDP: 4.0000 mg | ORAL_TABLET | Freq: Four times a day (QID) | ORAL | Status: DC | PRN

## 2022-11-18 MED ORDER — MIDODRINE HCL 5 MG PO TABS: 10.0000 mg | ORAL_TABLET | Freq: Three times a day (TID) | ORAL | Status: DC

## 2022-11-18 MED ORDER — GLYCOPYRROLATE 1 MG PO TABS: 1.0000 mg | ORAL_TABLET | ORAL | Status: DC | PRN

## 2022-11-18 MED ORDER — HYDROMORPHONE HCL 1 MG/ML IJ SOLN
0.5000 mg | Freq: Four times a day (QID) | INTRAMUSCULAR | Status: DC
  Administered 2022-11-18: 0.5 mg via INTRAVENOUS
  Filled 2022-11-18: qty 0.5

## 2022-11-18 MED ORDER — MIDODRINE HCL 5 MG PO TABS
5.0000 mg | ORAL_TABLET | Freq: Three times a day (TID) | ORAL | Status: DC
  Filled 2022-11-18: qty 1

## 2022-11-18 MED ORDER — POTASSIUM CHLORIDE 10 MEQ/100ML IV SOLN
10.0000 meq | INTRAVENOUS | Status: DC
  Administered 2022-11-18: 10 meq via INTRAVENOUS
  Filled 2022-11-18 (×2): qty 100

## 2022-11-18 MED ORDER — POLYVINYL ALCOHOL 1.4 % OP SOLN: 1.0000 [drp] | Freq: Four times a day (QID) | OPHTHALMIC | Status: DC | PRN

## 2022-11-18 MED ORDER — BIOTENE DRY MOUTH MT LIQD: 15.0000 mL | OROMUCOSAL | Status: DC | PRN

## 2022-11-18 MED ORDER — HYDROMORPHONE HCL 1 MG/ML IJ SOLN
0.5000 mg | INTRAMUSCULAR | Status: DC | PRN
  Administered 2022-11-18: 1 mg via INTRAVENOUS
  Filled 2022-11-18: qty 1

## 2022-11-18 MED ORDER — POTASSIUM PHOSPHATES 15 MMOLE/5ML IV SOLN
30.0000 mmol | Freq: Once | INTRAVENOUS | Status: DC
  Filled 2022-11-18: qty 10

## 2022-11-18 MED ORDER — HYDROMORPHONE HCL-NACL 50-0.9 MG/50ML-% IV SOLN
1.0000 mg/h | INTRAVENOUS | Status: DC
  Administered 2022-11-18: 1 mg/h via INTRAVENOUS
  Filled 2022-11-18: qty 50

## 2022-11-18 MED ORDER — POTASSIUM CHLORIDE 10 MEQ/100ML IV SOLN: 10.0000 meq | INTRAVENOUS | Status: DC

## 2022-11-19 LAB — HEMOGLOBIN A1C
Hgb A1c MFr Bld: 5.4 % (ref 4.8–5.6)
Mean Plasma Glucose: 108 mg/dL

## 2022-11-20 LAB — CULTURE, BLOOD (ROUTINE X 2)
Culture: NO GROWTH
Special Requests: ADEQUATE

## 2022-11-21 LAB — CULTURE, BLOOD (ROUTINE X 2)

## 2022-11-28 NOTE — Progress Notes (Addendum)
Inpatient Diabetes Program Recommendations  AACE/ADA: New Consensus Statement on Inpatient Glycemic Control (2015)  Target Ranges:  Prepandial:   less than 140 mg/dL      Peak postprandial:   less than 180 mg/dL (1-2 hours)      Critically ill patients:  140 - 180 mg/dL   Lab Results  Component Value Date   GLUCAP 197 (H) 2022/12/16   HGBA1C 7.2 (H) 01/30/2019    Review of Glycemic Control  Latest Reference Range & Units 11/17/22 11:10 11/17/22 15:49 11/17/22 19:09 11/17/22 23:09 12-16-2022 03:19 12/16/2022 07:28  Glucose-Capillary 70 - 99 mg/dL 142 (H) 161 (H) 171 (H) 175 (H) 207 (H) 197 (H)   Diabetes history: DM 2 Outpatient Diabetes medications: Metformin 500 mg daily Current orders for Inpatient glycemic control:  Novolog moderate q 4 hours Vital 55 ml/hr Inpatient Diabetes Program Recommendations:    If appropriate, consider adding Novolog tube feed coverage 2 units q 4 hours.   Thanks,  Adah Perl, RN, BC-ADM Inpatient Diabetes Coordinator Pager [phone]  (8a-5p)

## 2022-11-28 NOTE — H&P (Signed)
Attested        Attestation signed by Laurin Coder, MD at 11/10/2022 10:52 AM (Updated)   Patient seen, independently examined, care plan was formulated and discussed with [name] Minor as per documentation   Patient resides in a nursing home Brought in secondary to altered mentation Patient unable to provide history, no family available   Patient is DNR Was recently hospitalized for UTI sepsis       Past Medical History:  Diagnosis Date   Anxiety      Due to current back pain and has to lie flat.   Arthritis     CAD (coronary artery disease)      a. NSTEMI in setting of UTI and SVT 12/2012 => LHC 01/08/13: Proximal LAD 30%, ostial diagonal 30-40%, proximal RCA 95%, inferior AK, EF 45-50% => PCI: Promus DES x 2 to RCA;  b. Echo 01/05/13: EF 86-76%, grade 1 diastolic dysfunction, MAC    Compression fracture      thoracic 12   Edema     History of blood transfusion     Hypercholesteremia     Hypertension     Myocardial infarction [facility]) 2014   Rectal ulceration      colonoscopy 12/2012   SVT (supraventricular tachycardia)     Type II diabetes mellitus (HCC)      Chronic diastolic congestive heart failure, hypertensive heart disease with heart failure, chronic ischemic heart disease, multinodular goiter, hyperlipidemia, type 2 diabetes, diabetic retinopathy hypertension, thoracic aortic aneurysm, mood disorder, abdominal aortic aneurysm, generalized anxiety disorder   Physical exam Hypotensive Dry oral mucosa Encephalopathic S1-S2 appreciated Clear breath sounds Bowel sounds appreciated Does have some erythema lower extremity   Labs reviewed   Assessment/plan Sepsis from possible UTI Sepsis probably related to cellulitis -Follow blood cultures -Continue vancomycin, -Zosyn -De-escalate antibiotics depending on culture findings   Type 2 diabetes -SSI   History of congestive heart failure -Diastolic dysfunction -Cautious fluid resuscitation -Additional  pressors as needed   Will consult palliative care to assist with navigating care   The patient is critically ill with multiple organ systems failure and requires high complexity decision making for assessment and support, frequent evaluation and titration of therapies, application of advanced monitoring technologies and extensive interpretation of multiple databases. Critical Care Time devoted to patient care services described in this note independent of APP/resident time (if applicable)  is 32 minutes.    Sherrilyn Rist MD [name] Personal pager: See Amion If unanswered, please page CCM On-call: [phone]          Expand All Collapse All     NAME:  [name], MRN:  [id number], DOB:  January 09, 1946, LOS: 0 ADMISSION DATE:  11/27/2022, CONSULTATION DATE: 11/04/2022 REFERRING MD: Emergency department physician, CHIEF COMPLAINT: Shock   History of Present Illness:  77 year old female, nursing home resident, bedridden, he was a known DNR and presents with sudden onset of decreased level of consciousness and tachycardia.  She was recently admitted for urinary tract infection with multidrug-resistant bacteria.  She has no healthcare power of attorney.  Pulmonary critical care called to the bedside and she will be admitted to the intensive care unit and treated with antibiotics plus or minus vasopressors and she may need intubation.   Pertinent  Medical History        Past Medical History:  Diagnosis Date   Anxiety      Due to current back pain and has to lie flat.   Arthritis  CAD (coronary artery disease)      a. NSTEMI in setting of UTI and SVT 12/2012 => LHC 01/08/13: Proximal LAD 30%, ostial diagonal 30-40%, proximal RCA 95%, inferior AK, EF 45-50% => PCI: Promus DES x 2 to RCA;  b. Echo 01/05/13: EF 85-46%, grade 1 diastolic dysfunction, MAC    Compression fracture      thoracic 12   Edema     History of blood transfusion     Hypercholesteremia      Hypertension     Myocardial infarction [facility]) 2014   Rectal ulceration      colonoscopy 12/2012   SVT (supraventricular tachycardia)     Type II diabetes mellitus ([place])          Significant Hospital Events: Including procedures, antibiotic start and stop dates in addition to other pertinent events       Interim History / Subjective:  Multiple notes from the nursing home and a DNR patient who again presents with most likely sepsis from urinary tract infection but is obviously volume overloaded.  Most likely she will need a central line antimicrobial therapy and admission to the intensive care unit.     Objective   Blood pressure (!) 82/61, pulse (!) 150, temperature (!) 97 F (36.1 C), temperature source Temporal, resp. rate (!) 28, SpO2 94 %.    >       No intake or output data in the 24 hours ending 11/06/2022 0814 There were no vitals filed for this visit.   Examination: General: Frail elderly female who is obviously volume overloaded HENT: Positive JVD Lungs: Decreased air movement Cardiovascular: Appears to be a sinus tach 1 5160s Abdomen: Obese soft tender Extremities: Massive anasarca Neuro: Will respond with lip speaking Sinus tach   Resolved Hospital Problem list       Assessment & Plan:  Shock from presumed sepsis most likely from UTI.  She is a DNR.  She is bedridden and nursing home.  She has had multiple episodes of hypotension that responded to antibiotics and fluids in the past.  She appears to be grossly volume overloaded at this point. Antimicrobial therapy will utilize Zosyn due to her history of multiresistant bacteria in the urine Need to pursue full DNR without resuscitation or she will require vasopressor support admission to the ICU and reversal of DNR status.   Type 2 diabetes mellitus CBG (last 3)  Recent Labs (last 2 labs)  No results for input(s): "GLUCAP" in the last 72 hours.   Sliding-scale insulin   Congestive heart failure no 2D echo  since 2020 showed grade 1 diastolic dysfunction Careful with fluid administration s Best Practice (right click and "Reselect all SmartList Selections" daily)    Diet/type: NPO DVT prophylaxis:  GI prophylaxis: PPI Lines: N/A Foley:  N/A Code Status:  DNR Last date of multidisciplinary goals of care discussion [tbd]   Labs   CBC: Last Labs     Recent Labs  Lab 11/07/2022 0700  WBC 16.5*  NEUTROABS 13.6*  HGB 13.4  HCT 43.0  MCV 103.9*  PLT 247        Basic Metabolic Panel: Last Labs  No results for input(s): "NA", "K", "CL", "CO2", "GLUCOSE", "BUN", "CREATININE", "CALCIUM", "MG", "PHOS" in the last 168 hours.   GFR: CrCl cannot be calculated (Patient's most recent lab result is older than the maximum 21 days allowed.). Last Labs     Recent Labs  Lab 11/12/2022 0700  WBC 16.5*  Liver Function Tests: Last Labs  No results for input(s): "AST", "ALT", "ALKPHOS", "BILITOT", "PROT", "ALBUMIN" in the last 168 hours.   Last Labs  No results for input(s): "LIPASE", "AMYLASE" in the last 168 hours.   Last Labs  No results for input(s): "AMMONIA" in the last 168 hours.     ABG Labs (Brief)          Component Value Date/Time    TCO2 25 05/25/2016 2014        Coagulation Profile: Last Labs  No results for input(s): "INR", "PROTIME" in the last 168 hours.     Cardiac Enzymes: Last Labs  No results for input(s): "CKTOTAL", "CKMB", "CKMBINDEX", "TROPONINI" in the last 168 hours.     HbA1C: Last Labs       Hemoglobin A1C  Date/Time Value Ref Range Status  12/13/2018 12:00 AM 6.0   Final           Hgb A1c MFr Bld  Date/Time Value Ref Range Status  01/30/2019 02:56 PM 7.2 (H) 4.8 - 5.6 % Final      Comment:      (NOTE) Pre diabetes:          5.7%-6.4% Diabetes:              >6.4% Glycemic control for   <7.0% adults with diabetes    07/18/2015 10:06 PM 5.3 4.8 - 5.6 % Final      Comment:      (NOTE)         Pre-diabetes: 5.7 - 6.4          Diabetes: >6.4         Glycemic control for adults with diabetes: <7.0          CBG: Last Labs  No results for input(s): "GLUCAP" in the last 168 hours.     Review of Systems:   na   Past Medical History:  She,  has a past medical history of Anxiety, Arthritis, CAD (coronary artery disease), Compression fracture, Edema, History of blood transfusion, Hypercholesteremia, Hypertension, Myocardial infarction ([place]) (2014), Rectal ulceration, SVT (supraventricular tachycardia), and Type II diabetes mellitus ([place]).    Surgical History:         Past Surgical History:  Procedure Laterality Date   BREAST SURGERY       CATARACT EXTRACTION, BILATERAL Bilateral     CHOLECYSTECTOMY       COLONOSCOPY Left 01/12/2013    Procedure: COLONOSCOPY;  Surgeon: Wonda Horner, MD;  Location[facility] ENDOSCOPY;  Service: Endoscopy;  Laterality: Left;   COLONOSCOPY N/A 01/14/2013    Procedure: COLONOSCOPY;  Surgeon: Wonda Horner, MD;  Location: [facility] ENDOSCOPY;  Service: Endoscopy;  Laterality: N/A;  Rm 2034, attemped Colon on 2-15=inadequate prep   CORONARY ANGIOPLASTY   12/29/2012   EYE SURGERY Bilateral "many"   FRACTURE SURGERY       KYPHOPLASTY   12/27/2012    Procedure: KYPHOPLASTY;  Surgeon: Sinclair Ship, MD;  Location: [place];  Service: Orthopedics;  Laterality: Bilateral;  T-8 kyphoplasty   KYPHOPLASTY N/A 07/15/2015    Procedure: T11 KYPHOPLASTY;  Surgeon: Phylliss Bob, MD;  Location: [place];  Service: Orthopedics;  Laterality: N/A;  T11 kyphoplasty   KYPHOPLASTY N/A 09/17/2015    Procedure: T12 kyphoplasty;  Surgeon: Phylliss Bob, MD;  Location: [place];  Service: Orthopedics;  Laterality: N/A;  T12 kyphoplasty   LEFT HEART CATHETERIZATION WITH CORONARY ANGIOGRAM N/A 01/08/2013    Procedure: LEFT HEART CATHETERIZATION WITH  CORONARY ANGIOGRAM;  Surgeon: Thayer Headings, MD;  Location: [facility] CATH LAB;  Service: Cardiovascular;  Laterality: N/A;   ORIF HIP FRACTURE Left 11/15/2014    Procedure:  OPEN REDUCTION INTERNAL FIXATION HIP INTERTROCH;  Surgeon: Mcarthur Rossetti, MD;  Location: [place];  Service: Orthopedics;  Laterality: Left;   PERCUTANEOUS STENT INTERVENTION N/A 01/15/2013    Procedure: PERCUTANEOUS STENT INTERVENTION;  Surgeon: Peter M Martinique, MD;  Location: [facility] CATH LAB;  Service: Cardiovascular;  Laterality: N/A;   TIBIA IM NAIL INSERTION Right 05/26/2016    Procedure: INTRAMEDULLARY (IM) NAIL TIBIAL;  Surgeon: Meredith Pel, MD;  Location: [place];  Service: Orthopedics;  Laterality: Right;   TUBAL LIGATION          Social History:   reports that she has never smoked. She has never used smokeless tobacco. She reports that she does not drink alcohol and does not use drugs.    Family History:  Her family history includes Hypertension in her brother.    Allergies     Allergies  Allergen Reactions   Oxycodone Nausea Only      Home Medications         Prior to Admission medications   Medication Sig Start Date End Date Taking? Authorizing Provider  acetaminophen (TYLENOL) 325 MG tablet Take 650 mg by mouth every 6 (six) hours as needed for mild pain.        [provider]  ARIPiprazole (ABILIFY) 5 MG tablet Take 5 mg by mouth daily. 06/08/22     [provider]  calcium-vitamin D (OSCAL WITH D) 500-200 MG-UNIT tablet Take 1 tablet by mouth daily.        [provider]  cephALEXin (KEFLEX) 250 MG capsule Take 1 capsule (250 mg total) by mouth 4 (four) times daily. 06/28/22     Daleen Bo, MD  Cholecalciferol (VITAMIN D) 50 MCG (2000 UT) CAPS Take 2,000 Units by mouth at bedtime.        [provider]  furosemide (LASIX) 40 MG tablet Take 40 mg by mouth daily.        [provider]  ipratropium-albuterol (DUONEB) 0.5-2.5 (3) MG/3ML SOLN Take 3 mLs by nebulization every 6 (six) hours as needed (SOB).       [provider]  lidocaine (LIDODERM) 5 % Place 1 patch onto the skin daily. Remove & Discard patch  within 12 hours or as directed by MD . Apply over left lateral chest 12/11/18     Dhungel, Nishant, MD  metFORMIN (GLUCOPHAGE) 500 MG tablet Take 500 mg by mouth daily. 05/20/22     [provider]  metoprolol (LOPRESSOR) 50 MG tablet Take 1 tablet (50 mg total) by mouth 2 (two) times daily. PLEASE CONTACT OFFICE FOR ADDITIONAL REFILLS 09/29/16     Minus Breeding, MD  metoprolol succinate (TOPROL-XL) 25 MG 24 hr tablet Take 25 mg by mouth daily. 05/20/22     [provider]  Multiple Vitamin (MULTIVITAMIN WITH MINERALS) TABS tablet Take 1 tablet by mouth daily.        [provider]  nitroGLYCERIN (NITROSTAT) 0.4 MG SL tablet Place 1 tablet (0.4 mg total) under the tongue every 5 (five) minutes as needed for chest pain. 04/16/13     Richardson Dopp T, PA-C  ondansetron (ZOFRAN) 4 MG tablet Take 4 mg by mouth every 8 (eight) hours as needed for nausea or vomiting.       [provider]  simvastatin (  ZOCOR) 40 MG tablet Take 40 mg by mouth at bedtime.        [provider]  tamsulosin (FLOMAX) 0.4 MG CAPS capsule Take 1 capsule (0.4 mg total) by mouth daily. 02/08/19     Aline August, MD  vitamin B-12 (CYANOCOBALAMIN) 1000 MCG tablet Take 1,000 mcg by mouth daily.       [provider]      Critical care time: 61 min      Richardson Landry Minor ACNP Acute Care Nurse Practitioner [place] Please consult Amion 11/12/2022, 8:15 AM                Cosigned by: Laurin Coder, MD at 11/13/2022 10:52 AM   Revision History  Routing History

## 2022-11-28 NOTE — Progress Notes (Signed)
NAME:  [name], MRN:  [id number], DOB:  Aug 31, 1946, LOS: 1 ADMISSION DATE:  11/17/2022, CONSULTATION DATE: 11/02/2022 REFERRING MD: Emergency department physician, CHIEF COMPLAINT: Shock  History of Present Illness:  77 year old female, nursing home resident, bedridden, she was a known DNR and presents with sudden onset of decreased level of consciousness and tachycardia.  She was recently admitted for urinary tract infection with multidrug-resistant bacteria.  She has no healthcare power of attorney.  Pulmonary critical care called to the bedside and she will be admitted to the intensive care unit and treated with antibiotics plus or minus vasopressors and she may need intubation.  Pertinent  Medical History   Past Medical History:  Diagnosis Date   Anxiety    Due to current back pain and has to lie flat.   Arthritis    CAD (coronary artery disease)    a. NSTEMI in setting of UTI and SVT 12/2012 => LHC 01/08/13: Proximal LAD 30%, ostial diagonal 30-40%, proximal RCA 95%, inferior AK, EF 45-50% => PCI: Promus DES x 2 to RCA;  b. Echo 01/05/13: EF 63-78%, grade 1 diastolic dysfunction, MAC    Compression fracture    thoracic 12   Edema    History of blood transfusion    Hypercholesteremia    Hypertension    Myocardial infarction [facility]) 2014   Rectal ulceration    colonoscopy 12/2012   SVT (supraventricular tachycardia)    Type II diabetes mellitus ([place])    Significant Hospital Events: Including procedures, antibiotic start and stop dates in addition to other pertinent events   11/16/2022 admitted to ICU  Interim History / Subjective:  No acute overnights. Remains afebrile. Doing down on norepinephrine.    Objective   Blood pressure 94/70, pulse 81, temperature 98.3 F (36.8 C), temperature source Oral, resp. rate 15, weight 74.7 kg, SpO2 100 %.    FiO2 (%):  [28 %] 28 %   Intake/Output Summary (Last 24 hours) at 11/16/2022 5885 Last data filed at 11/16/2022 0600 Gross  per 24 hour  Intake 2607.24 ml  Output 60 ml  Net 2547.24 ml   Filed Weights   11/24/2022 1030  Weight: 74.7 kg   Examination: General: Frail elderly female laying in bed. Chronically ill appearing. No acute distress HENT: [place]/AT, positive JVD Lungs: quiet lung sounds, no crackles or wheezing Cardiovascular: regular rate, no murmur Abdomen: soft, non tender, and non distended  Extremities: significant anasarca, erythema of BLE  Neuro: Awake, eyes tracking, nods and shakes head to some questions   Resolved Hospital Problem list     Assessment & Plan:  Septic shock from possible UTI and cellulitis History of ESBL UTI, suspect UTI no growth on blood cultures. Goals of care discussion with friend [name] Kary Kos. Patient with no family. [name] is patient's friend and decision maker who shared patient would not to have life prolonging measures given lack of clinical improvement. -Transition to full comfort -Start on low dose hydromorphone infusion for comfot  -Will arrange transition to [facility] [place]. May pass in hospital prior friend is aware -appreciate assistance by palliative care team   Best Practice (right click and "Reselect all SmartList Selections" daily)  Diet/type: NPO, SLP following DVT prophylaxis:  GI prophylaxis: PPI Lines: N/A Foley:  N/A Code Status:  DNR Last date of multidisciplinary goals of care discussion [30-Nov-2022 with friend [name] Kary Kos with palliative]  Labs   CBC: Recent Labs  Lab 11/20/2022 0700  WBC 16.5*  NEUTROABS 13.6*  HGB 13.4  HCT 43.0  MCV 103.9*  PLT 247     Basic Metabolic Panel: Recent Labs  Lab 11/08/2022 0700  NA 146*  K 3.8  CL 106  CO2 11*  GLUCOSE 153*  BUN 17  CREATININE 1.50*  CALCIUM 9.2   GFR: CrCl cannot be calculated (Unknown ideal weight.). Recent Labs  Lab 11/08/2022 0700 11/24/2022 0955 11/21/2022 0956 11/13/2022 1322  PROCALCITON  --   --  0.27  --   WBC 16.5*  --   --   --   LATICACIDVEN  4.6* 5.5*  --  3.1*     Liver Function Tests: Recent Labs  Lab 11/13/2022 0700  AST 22  ALT 13  ALKPHOS 59  BILITOT 1.5*  PROT 5.8*  ALBUMIN 2.3*   No results for input(s): "LIPASE", "AMYLASE" in the last 168 hours. No results for input(s): "AMMONIA" in the last 168 hours.  ABG    Component Value Date/Time   TCO2 25 05/25/2016 2014     Coagulation Profile: Recent Labs  Lab 11/23/2022 0700  INR 1.2    Cardiac Enzymes: No results for input(s): "CKTOTAL", "CKMB", "CKMBINDEX", "TROPONINI" in the last 168 hours.  HbA1C: Hemoglobin A1C  Date/Time Value Ref Range Status  12/13/2018 12:00 AM 6.0  Final   Hgb A1c MFr Bld  Date/Time Value Ref Range Status  01/30/2019 02:56 PM 7.2 (H) 4.8 - 5.6 % Final    Comment:    (NOTE) Pre diabetes:          5.7%-6.4% Diabetes:              >6.4% Glycemic control for   <7.0% adults with diabetes   07/18/2015 10:06 PM 5.3 4.8 - 5.6 % Final    Comment:    (NOTE)         Pre-diabetes: 5.7 - 6.4         Diabetes: >6.4         Glycemic control for adults with diabetes: <7.0     CBG: Recent Labs  Lab 10/29/2022 1136 10/29/2022 1534 11/04/2022 1912 11/01/2022 2301 11/16/22 0309  GLUCAP 149* 171* 159* 152* 134*    Review of Systems:     Past Medical History:  She,  has a past medical history of Anxiety, Arthritis, CAD (coronary artery disease), Compression fracture, Edema, History of blood transfusion, Hypercholesteremia, Hypertension, Myocardial infarction ([place]) (2014), Rectal ulceration, SVT (supraventricular tachycardia), and Type II diabetes mellitus ([place]).   Surgical History:   Past Surgical History:  Procedure Laterality Date   BREAST SURGERY     CATARACT EXTRACTION, BILATERAL Bilateral    CHOLECYSTECTOMY     COLONOSCOPY Left 01/12/2013   Procedure: COLONOSCOPY;  Surgeon: Wonda Horner, MD;  Location: [facility] ENDOSCOPY;  Service: Endoscopy;  Laterality: Left;   COLONOSCOPY N/A 01/14/2013   Procedure: COLONOSCOPY;  Surgeon:  Wonda Horner, MD;  Location[facility] ENDOSCOPY;  Service: Endoscopy;  Laterality: N/A;  Rm 2034, attemped Colon on 2-15=inadequate prep   CORONARY ANGIOPLASTY  12/29/2012   EYE SURGERY Bilateral "many"   FRACTURE SURGERY     KYPHOPLASTY  12/27/2012   Procedure: KYPHOPLASTY;  Surgeon: Sinclair Ship, MD;  Location: [place];  Service: Orthopedics;  Laterality: Bilateral;  T-8 kyphoplasty   KYPHOPLASTY N/A 07/15/2015   Procedure: T11 KYPHOPLASTY;  Surgeon: Phylliss Bob, MD;  Location: [place];  Service: Orthopedics;  Laterality: N/A;  T11 kyphoplasty   KYPHOPLASTY N/A 09/17/2015   Procedure: T12 kyphoplasty;  Surgeon: Phylliss Bob,  MD;  Location: [place];  Service: Orthopedics;  Laterality: N/A;  T12 kyphoplasty   LEFT HEART CATHETERIZATION WITH CORONARY ANGIOGRAM N/A 01/08/2013   Procedure: LEFT HEART CATHETERIZATION WITH CORONARY ANGIOGRAM;  Surgeon: Thayer Headings, MD;  Location: [facility] CATH LAB;  Service: Cardiovascular;  Laterality: N/A;   ORIF HIP FRACTURE Left 11/15/2014   Procedure: OPEN REDUCTION INTERNAL FIXATION HIP INTERTROCH;  Surgeon: Mcarthur Rossetti, MD;  Location: [place];  Service: Orthopedics;  Laterality: Left;   PERCUTANEOUS STENT INTERVENTION N/A 01/15/2013   Procedure: PERCUTANEOUS STENT INTERVENTION;  Surgeon: Peter M Martinique, MD;  Location[facility] CATH LAB;  Service: Cardiovascular;  Laterality: N/A;   TIBIA IM NAIL INSERTION Right 05/26/2016   Procedure: INTRAMEDULLARY (IM) NAIL TIBIAL;  Surgeon: Meredith Pel, MD;  Location: [place];  Service: Orthopedics;  Laterality: Right;   TUBAL LIGATION       Social History:   reports that she has never smoked. She has never used smokeless tobacco. She reports that she does not drink alcohol and does not use drugs.   Family History:  Her family history includes Hypertension in her brother.   Allergies Allergies  Allergen Reactions   Oxycodone Nausea Only     Home Medications  Prior to Admission medications   Medication  Sig Start Date End Date Taking? Authorizing Provider  acetaminophen (TYLENOL) 325 MG tablet Take 650 mg by mouth every 6 (six) hours as needed for mild pain.     [provider]  ARIPiprazole (ABILIFY) 5 MG tablet Take 5 mg by mouth daily. 06/08/22   [provider]  calcium-vitamin D (OSCAL WITH D) 500-200 MG-UNIT tablet Take 1 tablet by mouth daily.     [provider]  cephALEXin (KEFLEX) 250 MG capsule Take 1 capsule (250 mg total) by mouth 4 (four) times daily. 06/28/22   Daleen Bo, MD  Cholecalciferol (VITAMIN D) 50 MCG (2000 UT) CAPS Take 2,000 Units by mouth at bedtime.     [provider]  furosemide (LASIX) 40 MG tablet Take 40 mg by mouth daily.     [provider]  ipratropium-albuterol (DUONEB) 0.5-2.5 (3) MG/3ML SOLN Take 3 mLs by nebulization every 6 (six) hours as needed (SOB).    [provider]  lidocaine (LIDODERM) 5 % Place 1 patch onto the skin daily. Remove & Discard patch within 12 hours or as directed by MD . Apply over left lateral chest 12/11/18   Dhungel, Nishant, MD  metFORMIN (GLUCOPHAGE) 500 MG tablet Take 500 mg by mouth daily. 05/20/22   [provider]  metoprolol (LOPRESSOR) 50 MG tablet Take 1 tablet (50 mg total) by mouth 2 (two) times daily. PLEASE CONTACT OFFICE FOR ADDITIONAL REFILLS 09/29/16   Minus Breeding, MD  metoprolol succinate (TOPROL-XL) 25 MG 24 hr tablet Take 25 mg by mouth daily. 05/20/22   [provider]  Multiple Vitamin (MULTIVITAMIN WITH MINERALS) TABS tablet Take 1 tablet by mouth daily.     [provider]  nitroGLYCERIN (NITROSTAT) 0.4 MG SL tablet Place 1 tablet (0.4 mg total) under the tongue every 5 (five) minutes as needed for chest pain. 04/16/13   Richardson Dopp T, PA-C  ondansetron (ZOFRAN) 4 MG tablet Take 4 mg by mouth every 8 (eight) hours as needed for nausea or vomiting.    [provider]  simvastatin (ZOCOR) 40 MG tablet Take 40 mg by mouth at  bedtime.     [provider]  tamsulosin (FLOMAX) 0.4 MG CAPS  capsule Take 1 capsule (0.4 mg total) by mouth daily. 02/08/19   Aline August, MD  vitamin B-12 (CYANOCOBALAMIN) 1000 MCG tablet Take 1,000 mcg by mouth daily.    [provider]    Iona Beard, MD 11-28-2022 IMTS PGY-3 Pager [phone]

## 2022-11-28 NOTE — Death Summary Note (Signed)
DEATH SUMMARY   Patient Details  Name: [name] MRN: [id number] DOB: Jun 06, 1946 KCM:[id number], [name], MD  Admission/Discharge Information   Admit Date:  November 19, 2022  Date of Death: Date of Death: 11/22/22  Time of Death: Time of Death: 02/12/04  Length of Stay: 3   Principle Cause of death: Respiratory arrest  Hospital Diagnoses: Principal Problem:   Shock [name]   Hospital Course: [name] is a 77 year ol female with a past medical history of CAD  s/p DES to RCS x2, HFpEF, HTN, HLD, T2DM who presented for acute metabolic encephalopathy from her nursing facility and tachycardia. She was admitted for septic shock likely due to urinary tract infection. History of ESBL UTI 4 months ago.She was admitted to the ICU for blood pressure support and intensive care unit. Noted to be anasarcic and tachycardic to the 130s. Received fluid resuscitation and broad spectrum antibiotics. During hospitalization had worsening AKI and third spacing. Remained on low dose pressure support through peripheral IV. Remained encephalopathic although awake, tracking, and able to indicate yes/no. Cortrak was place for nutritional feeds ans she was not alert enough to participate in feeding. Prior to admission she was bedridden but able to converse and feed her self. Despite treatment there was little clinical improvement.  Known DNR since 12-Feb-2019. She has no known family. She has a close friend [name] who serves as Recruitment consultant in absence of family or POA. Ultimately decided to transition to hospice and comfort care. Norephedrine and antibiotics were stooped. Her NG tube was removed. She was place on a hydromorphone drip. Patient passed comfortably at 1505 on 2022/11/22   Procedures:   Cortrak   Person Inserting Tube:  Ander Slade, RD Tube Type:  Cortrak - 43 inches Tube Size:  10 Tube Location:  Left nare Secured by: Bridle Technique Used to Measure Tube Placement:  Marking at  nare/corner of mouth Cortrak Secured At:  69 cm Procedure Comments:  Cortrak Tube Team Note:   Consult received to place a Cortrak feeding tube.    X-ray is required, abdominal x-ray has been ordered by the Cortrak team. Please confirm tube placement before using the Cortrak tube.    If the tube becomes dislodged please keep the tube and contact the Cortrak team at [email or web address] for replacement.  If after hours and replacement cannot be delayed, place a NG tube and confirm placement with an abdominal x-ray.      Ranell Patrick, RD, LDN Clinical Dietitian RD pager # available in [place]  After hours/weekend pager # available in [place]   Consultations: Palliative care  The results of significant diagnostics from this hospitalization (including imaging, microbiology, ancillary and laboratory) are listed below for reference.   Significant Diagnostic Studies: US ABDOMEN LIMITED WITH LIVER DOPPLER  Result Date: 11/16/2022 CLINICAL DATA:  Portal vein thrombosis EXAM: DUPLEX ULTRASOUND OF LIVER TECHNIQUE: Color and duplex Doppler ultrasound was performed to evaluate the hepatic in-flow and out-flow vessels. COMPARISON:  CT abdomen and pelvis 10/03/2018 FINDINGS: Portal Vein Velocities Main:  49 cm/sec Right:  37 cm/sec Left:  0.8 cm/sec Hepatic Vein Velocities Right:  25 cm/sec Middle:  25 cm/sec Left:  34 cm/sec Hepatic Artery Velocity:  102 cm/sec Splenic Vein Velocity:  13 cm/sec Varices: None Ascites: None Portal vein occlusion/thrombus: None Splenic vein occlusion/thrombus: None Liver: Diffusely decreased echogenicity. Gallbladder: Within normal limits.  No biliary ductal dilatation. Spleen: Measures 8.4 x 2.0 x 2.3 cm. IMPRESSION: 1. No evidence of portal vein occlusion or  thrombus. 2. Diffusely decreased echogenicity of the liver. This is a nonspecific finding but can be seen in hepatitis. Electronically Signed   By: Ronney Asters M.D.   On: 11/16/2022 19:48   DG Abd Portable 1V  Result Date:  11/16/2022 CLINICAL DATA:  Feeding tube placement EXAM: PORTABLE ABDOMEN - 1 VIEW COMPARISON:  None Available. FINDINGS: Feeding tube enters the stomach with the tip in the antrum of the stomach. Normal bowel gas pattern Vertebroplasty cement multiple vertebral bodies in the lower thoracic spine. IMPRESSION: Feeding tube tip in the antrum of the stomach. Electronically Signed   By: Franchot Gallo M.D.   On: 11/16/2022 14:35   DG Chest Port 1 View  Result Date: 11/01/2022 CLINICAL DATA:  Provided history: Questionable sepsis-evaluate for abnormality. EXAM: PORTABLE CHEST 1 VIEW COMPARISON:  Prior chest radiographs 06/28/2022 and earlier. FINDINGS: Cardiomegaly. Aortic atherosclerosis. Small left pleural effusion. Ill-defined opacity at the left lung base. No appreciable airspace consolidation on the right. No evidence of pneumothorax. Chronic compression fractures and sequelae of prior vertebral augmentation at multiple thoracic levels. IMPRESSION: 1. Small left pleural effusion. 2. Ill-defined opacity at the left lung base. This may reflect atelectasis. However, pneumonia cannot be excluded. 3. Cardiomegaly. 4. Aortic Atherosclerosis (ICD10-I70.0). Electronically Signed   By: Kellie Simmering D.O.   On: 11/08/2022 08:05    Microbiology: Recent Results (from the past 240 hour(s))  Blood Culture (routine x 2)     Status: None (Preliminary result)   Collection Time: 11/12/2022  7:00 AM   Specimen: BLOOD RIGHT WRIST  Result Value Ref Range Status   Specimen Description BLOOD RIGHT WRIST  Final   Special Requests   Final    BOTTLES DRAWN AEROBIC AND ANAEROBIC Blood Culture results may not be optimal due to an inadequate volume of blood received in culture bottles   Culture  Setup Time   Final    GRAM POSITIVE RODS ANAEROBIC BOTTLE ONLY PHARMD TONY  RUDISILL ON 11/16/22 @ 2256 BY DRT    Culture   Final    GRAM POSITIVE RODS CULTURE REINCUBATED FOR BETTER GROWTH Performed at [facility], [place] [address], [place], [place] [address]    Report Status PENDING  Incomplete  Resp panel by RT-PCR (RSV, Flu A&B, Covid)     Status: Abnormal   Collection Time: 11/03/2022  7:00 AM  Result Value Ref Range Status   SARS Coronavirus 2 by RT PCR POSITIVE (A) NEGATIVE Final    Comment: (NOTE) SARS-CoV-2 target nucleic acids are DETECTED.  The SARS-CoV-2 RNA is generally detectable in upper respiratory specimens during the acute phase of infection. Positive results are indicative of the presence of the identified virus, but do not rule out bacterial infection or co-infection with other pathogens not detected by the test. Clinical correlation with patient history and other diagnostic information is necessary to determine patient infection status. The expected result is Negative.  Fact Sheet for Patients: EntrepreneurPulse.com.au  Fact Sheet for Healthcare Providers: IncredibleEmployment.be  This test is not yet approved or cleared by the [place] FDA and  has been authorized for detection and/or diagnosis of SARS-CoV-2 by FDA under an Emergency Use Authorization (EUA).  This EUA will remain in effect (meaning this test can be used) for the duration of  the COVID-19 declaration under Section 564(b)(1) of the A ct, 21 U.S.C. section 360bbb-3(b)(1), unless the authorization is terminated or revoked sooner.     Influenza A by PCR NEGATIVE NEGATIVE Final  Influenza B by PCR NEGATIVE NEGATIVE Final    Comment: (NOTE) The Xpert Xpress SARS-CoV-2/FLU/RSV plus assay is intended as an aid in the diagnosis of influenza from Nasopharyngeal swab specimens and should not be used as a sole basis for treatment. Nasal washings and aspirates are unacceptable for Xpert Xpress SARS-CoV-2/FLU/RSV testing.  Fact Sheet for Patients: EntrepreneurPulse.com.au  Fact Sheet for Healthcare Providers: IncredibleEmployment.be  This test is  not yet approved or cleared by the [place] FDA and has been authorized for detection and/or diagnosis of SARS-CoV-2 by FDA under an Emergency Use Authorization (EUA). This EUA will remain in effect (meaning this test can be used) for the duration of the COVID-19 declaration under Section 564(b)(1) of the Act, 21 U.S.C. section 360bbb-3(b)(1), unless the authorization is terminated or revoked.     Resp Syncytial Virus by PCR NEGATIVE NEGATIVE Final    Comment: (NOTE) Fact Sheet for Patients: EntrepreneurPulse.com.au  Fact Sheet for Healthcare Providers: IncredibleEmployment.be  This test is not yet approved or cleared by the [place] FDA and has been authorized for detection and/or diagnosis of SARS-CoV-2 by FDA under an Emergency Use Authorization (EUA). This EUA will remain in effect (meaning this test can be used) for the duration of the COVID-19 declaration under Section 564(b)(1) of the Act, 21 U.S.C. section 360bbb-3(b)(1), unless the authorization is terminated or revoked.  Performed at [facility], [place] [address] [place], [place] [address]   Blood Culture (routine x 2)     Status: None (Preliminary result)   Collection Time: 11/20/2022  9:56 AM   Specimen: BLOOD RIGHT HAND  Result Value Ref Range Status   Specimen Description BLOOD RIGHT HAND  Final   Special Requests IN PEDIATRIC BOTTLE Blood Culture adequate volume  Final   Culture   Final    NO GROWTH 3 DAYS Performed at [facility], [place] [address], [place], [place] [address]    Report Status PENDING  Incomplete  MRSA Next Gen by PCR, Nasal     Status: Abnormal   Collection Time: December 17, 2022 12:56 AM   Specimen: Nasal Mucosa; Nasal Swab  Result Value Ref Range Status   MRSA by PCR Next Gen DETECTED (A) NOT DETECTED Final    Comment: RESULT CALLED TO, READ BACK BY AND VERIFIED WITH: A JANBARI,RN'@0334'$  17-Dec-2022 [place] (NOTE) The GeneXpert MRSA Assay (FDA  approved for NASAL specimens only), is one component of a comprehensive MRSA colonization surveillance program. It is not intended to diagnose MRSA infection nor to guide or monitor treatment for MRSA infections. Test performance is not FDA approved in patients less than 27 years old. Performed at [facility], [place] [address] [place], [place] [address]      Signed: Iona Beard, MD 2022/12/17

## 2022-11-28 NOTE — Progress Notes (Signed)
eLink Physician-Brief Progress Note Patient Name: [name] DOB: 04/13/1946 MRN: [id number]   Date of Service  11/21/22  HPI/Events of Note  Notified of hyperglycemia with glucose 207. Pt is on tube feeds now at goal rate.   eICU Interventions  Start on insulin sliding scale q4hrs.      Intervention Category Intermediate Interventions: Hyperglycemia - evaluation and treatment  Elsie Lincoln 21-Nov-2022, 3:36 AM

## 2022-11-28 NOTE — Progress Notes (Signed)
Pharmacy Electrolyte Replacement  Recent Labs:  Recent Labs    Nov 19, 2022 0548  K 2.9*  MG 1.8  PHOS 1.5*  CREATININE 1.82*    Low Critical Values (K </= 2.5, Phos </= 1, Mg </= 1) Present: K = 2.9 Phos 1.5  Plan:  KCL 10 meq IV x 2 KCL 40 meq po x 2 KPhos 30 mmol x 1  Alanda Slim, PharmD, [place] Clinical Pharmacist Please see AMION for all Pharmacists' Contact Phone Numbers 19-Nov-2022, 8:24 AM

## 2022-11-28 NOTE — Progress Notes (Addendum)
Palliative Medicine Inpatient Follow Up Note HPI: 77 y.o. female  with past medical history of Anxiety, arthritis, CAD, HLD, hypertension, MI, and diabetes admitted on 11/10/2022 with AMS and tachycardia.  Patient diagnosed with septic shock from possible UTI and cellulitis.  Patient with ongoing AMS.  Unable to maintain p.o. intake and so core track placed.  PMT consulted to discuss goals of care.   Today's Discussion 12/17/22  *Please note that this is a verbal dictation therefore any spelling or grammatical errors are due to the "[place] One" system interpretation.  Chart reviewed inclusive of vital signs, progress notes, laboratory results, and diagnostic images.   I met with [name] this morning she opens her eyes though does not track. [name] is chronically ill in appearanceShe is able to squeeze hand with prompting. Notable lymphedema. Bounding pulses.   Per patient RN, her good friends and primary decision maker will be coming in this afternoon.   I spoke to [name] over the phone. We discussed [name]'s health and how she has been ill for quite sometime now. We reviewed that she is not making improvements since admission. [name] feels that she knows [name] very well and she would not desire prolonged measures to sustain life.   Discussed the idea of keeping [name] comfortable. We talked about transition to comfort measures in house and what that would entail inclusive of medications to control pain, dyspnea, agitation, nausea, itching, and hiccups.  We discussed stopping all uneccessary measures such as cardiac monitoring, blood draws, needle sticks, and frequent vital signs.  Utilized reflective listening throughout our time together.  ___________________________________________ Addendum:  Palliative care has met with patients best friend, [name] and her loved ones [name] and [name]. Reviewed patients clinical condition in the setting her her severe sepsis. Determined  the plan for comfort care.   I shared patients timeframe is unknown but I anticipate hours to days at the most.  If possible patient friend [facility].  Patient seen by her friend. Plan for pastor to come by at patient is faithful.  Questions and concerns addressed/Palliative Support Provided.   Objective Assessment: Vital Signs Vitals:   17-Dec-2022 0900 2022-12-17 1000  BP: (!) 98/44 (!) 81/52  Pulse: (!) 107 (!) 113  Resp: 19 19  Temp:    SpO2: 100% 100%    Intake/Output Summary (Last 24 hours) at 12-17-22 1004 Last data filed at 2022/12/17 0800 Gross per 24 hour  Intake 1856.33 ml  Output 1075 ml  Net 781.33 ml   Last Weight  Most recent update: 17-Dec-2022  4:13 AM    Weight  85.2 kg (187 lb 13.3 oz)            Gen: Chronically ill-appearing elderly Caucasian female HEENT: Core track in place, dry mucous membranes CV: Irregular rate and rhythm PULM: On room air ABD: soft/nontender EXT: Notable bilateral lower extremity lymphedema, ecchymosis on bilateral upper extremities Neuro: Denies not vocal  SUMMARY OF RECOMMENDATIONS   DNAR/DNI  End of life care  Start low dose dilaudid gtt  Patients friend would like for her to transition to [place] --> I shared patient may very well pass prior  Ongoing support  Time Spent: 72 Billing based on MDM: High  Problems Addressed: One acute or chronic illness or injury that poses a threat to life or bodily function  Amount and/or Complexity of Data: Category 3:Discussion of management or test interpretation with external physician/other qualified health care professional/appropriate source (  not separately reported)  Risks: Parenteral controlled substances, Decision regarding hospitalization or escalation of hospital care, and Decision not to resuscitate or to de-escalate care because of poor  prognosis ______________________________________________________________________________________ [place] Team Team Cell Phone: [phone] Please utilize secure chat with additional questions, if there is no response within 30 minutes please call the above phone number  Palliative Medicine Team providers are available by phone from 7am to 7pm daily and can be reached through the team cell phone.  Should this patient require assistance outside of these hours, please call the patient's attending physician.

## 2022-11-28 DEATH — deceased
